# Patient Record
Sex: Female | Born: 1965 | Race: Black or African American | Hispanic: No | Marital: Single
Health system: Southern US, Community
[De-identification: ages and names within clinical notes are randomized; demographics above are authoritative.]

## PROBLEM LIST (undated history)

## (undated) DIAGNOSIS — M1990 Unspecified osteoarthritis, unspecified site: Secondary | ICD-10-CM

## (undated) DIAGNOSIS — J189 Pneumonia, unspecified organism: Secondary | ICD-10-CM

## (undated) DIAGNOSIS — K219 Gastro-esophageal reflux disease without esophagitis: Secondary | ICD-10-CM

## (undated) DIAGNOSIS — K5790 Diverticulosis of intestine, part unspecified, without perforation or abscess without bleeding: Secondary | ICD-10-CM

## (undated) DIAGNOSIS — E119 Type 2 diabetes mellitus without complications: Secondary | ICD-10-CM

## (undated) DIAGNOSIS — I10 Essential (primary) hypertension: Secondary | ICD-10-CM

## (undated) DIAGNOSIS — K279 Peptic ulcer, site unspecified, unspecified as acute or chronic, without hemorrhage or perforation: Secondary | ICD-10-CM

## (undated) DIAGNOSIS — K589 Irritable bowel syndrome without diarrhea: Secondary | ICD-10-CM

## (undated) DIAGNOSIS — M62838 Other muscle spasm: Secondary | ICD-10-CM

## (undated) DIAGNOSIS — S62102A Fracture of unspecified carpal bone, left wrist, initial encounter for closed fracture: Secondary | ICD-10-CM

## (undated) DIAGNOSIS — G8929 Other chronic pain: Secondary | ICD-10-CM

## (undated) DIAGNOSIS — F32A Depression, unspecified: Secondary | ICD-10-CM

## (undated) DIAGNOSIS — M549 Dorsalgia, unspecified: Secondary | ICD-10-CM

## (undated) DIAGNOSIS — F319 Bipolar disorder, unspecified: Secondary | ICD-10-CM

## (undated) DIAGNOSIS — T783XXA Angioneurotic edema, initial encounter: Secondary | ICD-10-CM

## (undated) DIAGNOSIS — R011 Cardiac murmur, unspecified: Secondary | ICD-10-CM

## (undated) DIAGNOSIS — I209 Angina pectoris, unspecified: Secondary | ICD-10-CM

## (undated) DIAGNOSIS — G5603 Carpal tunnel syndrome, bilateral upper limbs: Secondary | ICD-10-CM

## (undated) DIAGNOSIS — G629 Polyneuropathy, unspecified: Secondary | ICD-10-CM

## (undated) DIAGNOSIS — J45909 Unspecified asthma, uncomplicated: Secondary | ICD-10-CM

## (undated) DIAGNOSIS — E785 Hyperlipidemia, unspecified: Secondary | ICD-10-CM

## (undated) DIAGNOSIS — J449 Chronic obstructive pulmonary disease, unspecified: Secondary | ICD-10-CM

## (undated) DIAGNOSIS — F419 Anxiety disorder, unspecified: Secondary | ICD-10-CM

## (undated) DIAGNOSIS — K648 Other hemorrhoids: Secondary | ICD-10-CM

## (undated) DIAGNOSIS — F329 Major depressive disorder, single episode, unspecified: Secondary | ICD-10-CM

## (undated) HISTORY — DX: Carpal tunnel syndrome, bilateral upper limbs: G56.03

## (undated) HISTORY — DX: Diverticulosis of intestine, part unspecified, without perforation or abscess without bleeding: K57.90

## (undated) HISTORY — PX: WRIST FRACTURE SURGERY: SHX121

## (undated) HISTORY — PX: CHOLECYSTECTOMY: SHX55

## (undated) HISTORY — DX: Cardiac murmur, unspecified: R01.1

## (undated) HISTORY — DX: Other muscle spasm: M62.838

## (undated) HISTORY — DX: Angioneurotic edema, initial encounter: T78.3XXA

## (undated) HISTORY — DX: Irritable bowel syndrome, unspecified: K58.9

## (undated) HISTORY — PX: FOOT SURGERY: SHX648

## (undated) HISTORY — DX: Unspecified osteoarthritis, unspecified site: M19.90

## (undated) HISTORY — DX: Fracture of unspecified carpal bone, left wrist, initial encounter for closed fracture: S62.102A

## (undated) HISTORY — DX: Other hemorrhoids: K64.8

## (undated) HISTORY — PX: TONSILLECTOMY: SUR1361

## (undated) HISTORY — DX: Anxiety disorder, unspecified: F41.9

---

## 2001-08-24 HISTORY — PX: CARPAL TUNNEL RELEASE: SHX101

## 2006-08-24 HISTORY — PX: OTHER SURGICAL HISTORY: SHX169

## 2009-08-24 HISTORY — PX: ROTATOR CUFF REPAIR: SHX139

## 2011-06-19 ENCOUNTER — Emergency Department (HOSPITAL_COMMUNITY)
Admission: EM | Admit: 2011-06-19 | Discharge: 2011-06-19 | Disposition: A | Discharge status: Home / Self Care | Payer: Self-pay | Attending: Emergency Medicine | Admitting: Emergency Medicine

## 2011-06-19 DIAGNOSIS — K047 Periapical abscess without sinus: Secondary | ICD-10-CM | POA: Insufficient documentation

## 2011-06-19 DIAGNOSIS — F341 Dysthymic disorder: Secondary | ICD-10-CM | POA: Insufficient documentation

## 2011-06-19 DIAGNOSIS — R220 Localized swelling, mass and lump, head: Secondary | ICD-10-CM | POA: Insufficient documentation

## 2011-06-19 DIAGNOSIS — R221 Localized swelling, mass and lump, neck: Secondary | ICD-10-CM | POA: Insufficient documentation

## 2011-06-19 DIAGNOSIS — K089 Disorder of teeth and supporting structures, unspecified: Secondary | ICD-10-CM | POA: Insufficient documentation

## 2011-06-19 DIAGNOSIS — Z79899 Other long term (current) drug therapy: Secondary | ICD-10-CM | POA: Insufficient documentation

## 2011-06-19 DIAGNOSIS — K0381 Cracked tooth: Secondary | ICD-10-CM | POA: Insufficient documentation

## 2011-08-02 ENCOUNTER — Ambulatory Visit: Payer: Worker's Compensation

## 2011-08-05 ENCOUNTER — Ambulatory Visit: Payer: Worker's Compensation

## 2011-08-12 ENCOUNTER — Ambulatory Visit: Payer: Worker's Compensation

## 2011-09-30 ENCOUNTER — Encounter (HOSPITAL_COMMUNITY): Payer: Self-pay | Admitting: Emergency Medicine

## 2011-09-30 ENCOUNTER — Emergency Department (INDEPENDENT_AMBULATORY_CARE_PROVIDER_SITE_OTHER)
Admission: EM | Admit: 2011-09-30 | Discharge: 2011-09-30 | Disposition: A | Discharge status: Home / Self Care | Payer: Self-pay | Source: Home / Self Care

## 2011-09-30 DIAGNOSIS — M545 Low back pain, unspecified: Secondary | ICD-10-CM

## 2011-09-30 MED ORDER — CYCLOBENZAPRINE HCL 5 MG PO TABS: 5.0000 mg | ORAL_TABLET | Freq: Three times a day (TID) | ORAL | Status: AC | PRN

## 2011-09-30 MED ORDER — IBUPROFEN 800 MG PO TABS: 800.0000 mg | ORAL_TABLET | Freq: Three times a day (TID) | ORAL | Status: AC

## 2011-09-30 NOTE — ED Provider Notes (Signed)
History     CSN: [id number]  Arrival date & time 09/30/11  0957   None     Chief Complaint  Patient presents with  . Back Pain    (Consider location/radiation/quality/duration/timing/severity/associated sxs/prior treatment) HPI Comments: Amanda [name] presents with c/o low back spasms x 2 days. She denies injury or aggrevating activity. The pain does not radiate and she denies parasthesias of her lower extremities. She states she had similar back spasms 3 mos ago after she slipped and fell at work. She was treated with Percocet and Flexeril and this worked well for her. No abdominal pain, N/V, dysuria or fever.    History reviewed. No pertinent past medical history.  Past Surgical History  Procedure Date  . Rotator cuff repair   . Hammer toe fusion     History reviewed. No pertinent family history.  History  Substance Use Topics  . Smoking status: Never Smoker   . Smokeless tobacco: Not on file  . Alcohol Use: No    OB History    Grav Para Term Preterm Abortions TAB SAB Ect Mult Living                  Review of Systems  Constitutional: Negative for fever and chills.  Respiratory: Negative for cough and shortness of breath.   Cardiovascular: Negative for chest pain.  Gastrointestinal: Negative for nausea, vomiting, abdominal pain, diarrhea and constipation.  Genitourinary: Negative for dysuria and frequency.  Musculoskeletal: Positive for back pain.    Allergies  Penicillins  Home Medications   Current Outpatient Rx  Name Route Sig Dispense Refill  . ARIPIPRAZOLE 10 MG PO TABS Oral Take 10 mg by mouth daily.    Marland Kitchen LAMOTRIGINE 100 MG PO TABS Oral Take 100 mg by mouth daily.    Marland Kitchen OLANZAPINE 10 MG PO TABS Oral Take 10 mg by mouth at bedtime.    . TRAZODONE HCL 100 MG PO TABS Oral Take 100 mg by mouth at bedtime.    . CYCLOBENZAPRINE HCL 5 MG PO TABS Oral Take 1 tablet (5 mg total) by mouth 3 (three) times daily as needed for muscle spasms. 15 tablet 0  . IBUPROFEN 800  MG PO TABS Oral Take 1 tablet (800 mg total) by mouth 3 (three) times daily. 15 tablet 0    BP 132/80  Pulse 106  Temp(Src) 98.4 F (36.9 C) (Oral)  Resp 18  SpO2 98%  LMP 08/30/2011  Physical Exam  Nursing note and vitals reviewed. Constitutional: She appears well-developed and well-nourished. No distress.  HENT:  Head: Normocephalic and atraumatic.  Cardiovascular: Normal rate, regular rhythm and normal heart sounds.   Pulmonary/Chest: Effort normal and breath sounds normal. No respiratory distress.  Musculoskeletal:       Lumbar back: She exhibits tenderness and spasm. She exhibits normal range of motion, no bony tenderness, no swelling, no edema and no deformity.       Back:  Neurological: She is alert. She has normal strength. Gait normal.  Reflex Scores:      Patellar reflexes are 2+ on the right side and 2+ on the left side. Skin: Skin is warm and dry.  Psychiatric: She has a normal mood and affect.    ED Course  Procedures (including critical care time)  Labs Reviewed - No data to display No results found.   1. Acute lumbar back pain       MDM  Lumbar muscle spasm. No injury.  Melody Comas, [place] 09/30/11 1043

## 2011-09-30 NOTE — ED Notes (Signed)
PT HERE WITH MIDDLE OF BACK PAIN,NONRADIATING THAT FLARED UP YESTERDAY.PT S/P BACK INJURY WITH WORKERS COMP X 3 MNTHS AGO BUT STATES PAIN FLARED UP WHILE AT WORK.SX STRAIN AND SPASM THAT WORSENS IN THE AM.PT TRIED HEATING PAD BUT NO RELIEF.NO NUMB/TINGLING

## 2011-12-02 ENCOUNTER — Emergency Department (INDEPENDENT_AMBULATORY_CARE_PROVIDER_SITE_OTHER)
Admission: EM | Admit: 2011-12-02 | Discharge: 2011-12-02 | Disposition: A | Discharge status: Home / Self Care | Payer: Self-pay | Source: Home / Self Care | Attending: Emergency Medicine | Admitting: Emergency Medicine

## 2011-12-02 ENCOUNTER — Encounter (HOSPITAL_COMMUNITY): Payer: Self-pay | Admitting: Cardiology

## 2011-12-02 DIAGNOSIS — M7520 Bicipital tendinitis, unspecified shoulder: Secondary | ICD-10-CM

## 2011-12-02 DIAGNOSIS — S43429A Sprain of unspecified rotator cuff capsule, initial encounter: Secondary | ICD-10-CM

## 2011-12-02 DIAGNOSIS — M7522 Bicipital tendinitis, left shoulder: Secondary | ICD-10-CM

## 2011-12-02 HISTORY — DX: Depression, unspecified: F32.A

## 2011-12-02 HISTORY — DX: Major depressive disorder, single episode, unspecified: F32.9

## 2011-12-02 MED ORDER — MELOXICAM 7.5 MG PO TABS: 7.5000 mg | ORAL_TABLET | Freq: Every day | ORAL | Status: DC

## 2011-12-02 MED ORDER — TRAMADOL HCL 50 MG PO TABS: 50.0000 mg | ORAL_TABLET | Freq: Four times a day (QID) | ORAL | Status: AC | PRN

## 2011-12-02 NOTE — Discharge Instructions (Signed)
We have discussed her physical exam findings and also recommended you see orthopedics Dr. in [facility]. Have encouraged you to take this medicines to control your current discomfort.    Biceps Tendon Tendinitis (Proximal) and Tenosynovitis with Rehab Tendonitis and tenosynovitis involve inflammation of the tendon and the tendon lining (sheath). The proximal biceps tendon is vulnerable to tendonitis and tenosynovitis, which causes pain and discomfort in the front of the shoulder and upper arm. The tendon lining secretes a fluid that helps lubricate the tendon, allowing for proper function without pain. When the tendon and its lining become inflamed, the tendon can no longer glide smoothly, causing pain. The proximal biceps tendon connects the biceps muscle to two bones of the shoulder. It is important for proper function of the elbow and turning the palm upward (supination) using the wrist. Proximal biceps tendon tendinitis may include a grade 1 or 2 strain of the tendon. Grade 1 strains involve a slight pull of the tendon without signs of tearing and no observed tendon lengthening. There is also no loss of strength. Grade 2 strains involve small tears in the tendon fibers. The tendon or muscle is stretched and strength is usually decreased.  SYMPTOMS   Pain, tenderness, swelling, warmth, or redness over the front of the shoulder.   Pain that gets worse with shoulder and elbow use, especially against resistance.   Limited motion of the shoulder or elbow.   Crackling sound (crepitation) when the tendon or shoulder is moved or touched.  CAUSES  The symptoms of biceps tendonitis are due to inflammation of the tendon. Inflammation may be caused by:  Strain from sudden increase in amount or intensity of activity.   Direct blow or injury to the elbow (uncommon).   Overuse or repetitive elbow bending or wrist rotation, particularly when turning the palm up, or with elbow hyperextension.  RISK  INCREASES WITH:  Sports that involve contact or overhead arm activity (throwing sports, gymnastics, weightlifting, bodybuilding, rock climbing).   Heavy labor.   Poor strength and flexibility.   Failure to warm up properly before activity.  PREVENTION  Warm up and stretch properly before activity.   Allow time for recovery between activities.   Maintain physical fitness:   Strength, flexibility, and endurance.   Cardiovascular fitness.   Learn and use proper exercise technique.  PROGNOSIS  With proper treatment, proximal biceps tendon tendonitis and tenosynovitis is usually curable within 6 weeks. Healing is usually quicker if the cause was a direct blow, not overuse.  RELATED COMPLICATIONS   Longer healing time if not properly treated or if not given enough time to heal.   Chronically inflamed tendon that causes persistent pain with activity, that may progress to constant pain and potentially rupture of the tendon.   Recurring symptoms, especially if activity is resumed too soon or with overuse, a direct blow, or use of poor exercise technique.  TREATMENT Treatment first involves ice and medicine, to reduce pain and inflammation. It is helpful to modify activities that cause pain, to reduce the chances of causing the condition to get worse. Strengthening and stretching exercises should be performed to promote proper use of the muscles of the shoulder. These exercises may be performed at home or with a therapist. Other treatments may be given such as ultrasound or heat therapy. A corticosteroid injection may be recommended to help reduce inflammation of the tendon lining. Surgery is usually not necessary. Sometimes, if symptoms last for greater than 6 months, surgery will be  advised to detach the tendon and re-insert it into the arm bone. Surgery to correct other shoulder problems that may be contributing to tendinitis may be advised before surgery for the tendinitis itself.    MEDICATION  If pain medicine is needed, nonsteroidal anti-inflammatory medicines (aspirin and ibuprofen), or other minor pain relievers (acetaminophen), are often advised.   Do not take pain medicine for 7 days before surgery.   Prescription pain relievers may be given if your caregiver thinks they are needed. Use only as directed and only as much as you need.   Corticosteroid injections may be given. These injections should only be used on the most severe cases, as one can only receive a limited number of them.  HEAT AND COLD   Cold treatment (icing) should be applied for 10 to 15 minutes every 2 to 3 hours for inflammation and pain, and immediately after activity that aggravates your symptoms. Use ice packs or an ice massage.   Heat treatment may be used before performing stretching and strengthening activities prescribed by your caregiver, physical therapist, or athletic trainer. Use a heat pack or a warm water soak.  SEEK MEDICAL CARE IF:   Symptoms get worse or do not improve in 2 weeks, despite treatment.   New, unexplained symptoms develop. (Drugs used in treatment may produce side effects.)  EXERCISES RANGE OF MOTION (ROM) AND EXERCISES - Biceps Tendon (Proximal) and Tenosynovitis These exercises may help you when beginning to rehabilitate your injury. Your symptoms may go away with or without further involvement from your physician, physical therapist, or athletic trainer. While completing these exercises, remember:   Restoring tissue flexibility helps normal motion to return to the joints. This allows healthier, less painful movement and activity.   An effective stretch should be held for at least 30 seconds.   A stretch should never be painful. You should only feel a gentle lengthening or release in the stretched tissue.  STRETCH - Flexion, Standing  Stand with good posture. With an underhand grip on your right / left hand and an overhand grip on the opposite hand, grasp a  broomstick or cane so that your hands are a little more than shoulder width apart.   Keeping your right / left elbow straight and shoulder muscles relaxed, push the stick with your opposite hand to raise your right / left arm in front of your body and then overhead. Raise your arm until you feel a stretch in your right / left shoulder, but before you have increased shoulder pain.   Try to avoid shrugging your right / left shoulder as your arm rises, by keeping your shoulder blade tucked down and toward your mid-back spine. Hold for __________ seconds.   Slowly return to the starting position.  Repeat __________ times. Complete this exercise __________ times per day. STRETCH - Abduction, Supine  Lie on your back. With an underhand grip on your right / left hand and an overhand grip on the opposite hand, grasp a broomstick or cane so that your hands are a little more than shoulder width apart.   Keeping your right / left elbow straight and shoulder muscles relaxed, push the stick with your opposite hand to raise your right / left arm out to the side of your body and then overhead. Raise your arm until you feel a stretch in your right / left shoulder, but before you have increased shoulder pain.   Try to avoid shrugging your right / left shoulder as your arm  rises, by keeping your shoulder blade tucked down and toward your mid-back spine. Hold for __________ seconds.   Slowly return to the starting position.  Repeat __________ times. Complete this exercise __________ times per day. ROM - Flexion, Active-Assisted  Lie on your back. You may bend your knees for comfort.   Grasp a broomstick or cane so your hands are about shoulder width apart. Your right / left hand should grip the end of the stick so that your hand is positioned "thumbs-up," as if you were about to shake hands.   Using your healthy arm to lead, raise your right / left arm overhead until you feel a gentle stretch in your shoulder.  Hold for __________ seconds.   Use the stick to assist in returning your right / left arm to its starting position.  Repeat __________ times. Complete this exercise __________ times per day.  STRETCH - Flexion, Standing   Stand facing a wall. Walk your right / left fingers up the wall until you feel a moderate stretch in your shoulder. As your hand gets higher, you may need to step closer to the wall or use a door frame to walk through.   Try to avoid shrugging your right / left shoulder as your arm rises, by keeping your shoulder blade tucked down and toward your mid-back spine.   Hold for __________ seconds. Use your other hand, if needed, to ease out of the stretch and return to the starting position.  Repeat __________ times. Complete this exercise __________ times per day.  ROM - Internal Rotation   Using underhand grips, grasp a stick behind your back with both hands.   While standing upright with good posture, slide the stick up your back until you feel a mild stretch in the front of your shoulder.   Hold for __________ seconds. Slowly return to your starting position.  Repeat __________ times. Complete this exercise __________ times per day.  STRETCH - Internal Rotation  Place your right / left hand behind your back, palm-up.   Throw a towel or belt over your opposite shoulder. Grasp the towel with your right / left hand.   While keeping an upright posture, gently pull up on the towel until you feel a stretch in the front of your right / left shoulder.   Avoid shrugging your right / left shoulder as your arm rises, by keeping your shoulder blade tucked down and toward your mid-back spine.   Hold for __________ seconds. Release the stretch by lowering your opposite hand.  Repeat __________ times. Complete this exercise __________ times per day. STRENGTHENING EXERCISES - Biceps Tendon Tendinitis (Proximal) and Tenosynovitis These exercises may help you regain your strength  after your physician has discontinued your restraint in a cast or brace. They may resolve your symptoms with or without further involvement from your physician, physical therapist or athletic trainer. While completing these exercises, remember:   Muscles can gain both the endurance and the strength needed for everyday activities through controlled exercises.   Complete these exercises as instructed by your physician, physical therapist or athletic trainer. Increase the resistance and repetitions only as guided.   You may experience muscle soreness or fatigue, but the pain or discomfort you are trying to eliminate should never worsen during these exercises. If this pain does get worse, stop and make sure you are following the directions exactly. If the pain is still present after adjustments, discontinue the exercise until you can discuss the trouble with your  caregiver.  STRENGTH - Elbow Flexors, Isometric  Stand or sit upright on a firm surface. Place your right / left arm so that your hand is palm-up and at the height of your waist.   Place your opposite hand on top of your forearm. Gently push down as your right / left arm resists. Push as hard as you can with both arms, without causing any pain or movement at your right / left elbow. Hold this stationary position for __________ seconds.   Gradually release the tension in both arms. Allow your muscles to relax completely before repeating.  Repeat __________ times. Complete this exercise __________ times per day. STRENGTH - Shoulder Flexion, Isometric  With good posture and facing a wall, stand or sit about 4-6 inches away.   Keeping your right / left elbow straight, gently press the top of your fist into the wall. Increase the pressure gradually until you are pressing as hard as you can, without shrugging your shoulder or increasing any shoulder discomfort.   Hold for __________ seconds.   Release the tension slowly. Relax your shoulder  muscles completely before you start the next repetition.  Repeat __________ times. Complete this exercise __________ times per day.  STRENGTH - Elbow Flexors, Supinated  With good posture, stand or sit on a firm chair without armrests. Allow your right / left arm to rest at your side with your palm facing forward.   Holding a __________ weight, or gripping a rubber exercise band or tubing,   bring your hand toward your shoulder.   Allow your muscles to control the resistance as your hand returns to your side.  Repeat __________ times. Complete this exercise __________ times per day.  STRENGTH - Shoulder Flexion  Stand or sit with good posture. Grasp a __________ weight, or an exercise band or tubing, so that your hand is "thumbs-up," like when you shake hands.   Slowly lift your right / left arm as far as you can, without increasing any shoulder pain. At first, many people can only raise their hand to shoulder height.   Avoid shrugging your right / left shoulder as your arm rises, by keeping your shoulder blade tucked down and toward your mid-back spine.   Hold for __________ seconds. Control the descent of your hand as you slowly return to your starting position.  Repeat __________ times. Complete this exercise __________ times per day. Document Released: 08/10/2005 Document Revised: 07/30/2011 Document Reviewed: 11/22/2008 [facility] Patient Information 2012 [place], [place].

## 2011-12-02 NOTE — ED Notes (Addendum)
Pt c/o left shoulder pain that started one week ago. It is getting worse denies injury. Limited ROM. Pt has been working but had to call out today due to pain. Pt has been taking flexeril and ibuprofen she had for back pain. She did not get relief with those meds.

## 2011-12-02 NOTE — ED Provider Notes (Signed)
History     CSN: [id number]  Arrival date & time 12/02/11  1191   First MD Initiated Contact with Patient 12/02/11 1150      Chief Complaint  Patient presents with  . Shoulder Pain    (Consider location/radiation/quality/duration/timing/severity/associated sxs/prior treatment) Patient is a 46 y.o. female presenting with shoulder pain. The history is provided by the patient. No language interpreter was used.  Shoulder Pain This is a new (progessively worse over the week) problem. Episode onset: 1 week. Episode frequency: intermittent. The problem has been gradually worsening. Pertinent negatives include no chest pain and no shortness of breath. Exacerbated by: "lifting and letting the left arm down" The symptoms are relieved by nothing (has  used " Flexeril, Motrin without relief). Treatments tried: has used Flexeril and Ibuprofen without relief. The treatment provided no relief.    Past Medical History  Diagnosis Date  . Depression     Past Surgical History  Procedure Date  . Rotator cuff repair   . Hammer toe fusion     No family history on file.  History  Substance Use Topics  . Smoking status: Never Smoker   . Smokeless tobacco: Not on file  . Alcohol Use: No    OB History    Grav Para Term Preterm Abortions TAB SAB Ect Mult Living                  Review of Systems  Respiratory: Negative for shortness of breath.   Cardiovascular: Negative for chest pain.  Musculoskeletal: Negative for joint swelling.       " Left shoulder pain" "excruciating" with "movement and letting down" , some" weakness", reports previous hx of rotator cuff repair to right shoulder approximately 2 years ago  Skin: Negative for color change and rash.    Allergies  Penicillins  Home Medications   Current Outpatient Rx  Name Route Sig Dispense Refill  . ARIPIPRAZOLE 10 MG PO TABS Oral Take 10 mg by mouth daily.    Marland Kitchen LAMOTRIGINE 100 MG PO TABS Oral Take 100 mg by mouth daily.    .  TRAZODONE HCL 100 MG PO TABS Oral Take 100 mg by mouth at bedtime.    . MELOXICAM 7.5 MG PO TABS Oral Take 1 tablet (7.5 mg total) by mouth daily. 14 tablet 0  . OLANZAPINE 10 MG PO TABS Oral Take 10 mg by mouth at bedtime.    . TRAMADOL HCL 50 MG PO TABS Oral Take 1 tablet (50 mg total) by mouth every 6 (six) hours as needed for pain. 15 tablet 0    BP 140/95  Pulse 93  Temp(Src) 97.8 F (36.6 C) (Oral)  Resp 18  SpO2 98%  LMP 11/22/2011  Physical Exam  Constitutional: She is oriented to person, place, and time. She appears well-developed and well-nourished. No distress.  Pulmonary/Chest: Effort normal.  Musculoskeletal: She exhibits tenderness. She exhibits no edema.       Limited ROM to left upper extremity, able to abduction and adduction with pain, tenderness to lateral , anterior aspect of left shoulder, bicep area, strength 4/5, has good grip to left hand  Neurological: She is alert and oriented to person, place, and time.  Skin: Skin is warm and dry.       Skin intact    ED Course  Procedures (including critical care time)  Labs Reviewed - No data to display No results found.   1. Bicipital tendinitis of left shoulder   2.  Rotator cuff (capsule) sprain       MDM  Presents today to [facility] afebrile with c/o lateral and anterior left shoulder pain worsening over the past week, has used Flexeril and Ibuprofen without relief, able to abduct, adduct with some pain, P.S 4/10, patient will follow up with orthopedic , Rx for Meloxicam and Ultram given , patient agrees with tx plan, instruxcted to return or see  Orthopedics if s/s worsen        Lolly Mustache, Student-NP 12/02/11 1322

## 2011-12-02 NOTE — ED Provider Notes (Signed)
Medical screening examination/treatment/procedure(s) were performed by non-physician practitioner and as supervising physician I was immediately available for consultation/collaboration.I have examined ad interviewed the patient ad participated directly in her treatment plan  Amanda [name]   [name], MD 12/02/11 2002

## 2012-10-26 ENCOUNTER — Other Ambulatory Visit: Payer: Self-pay | Admitting: Internal Medicine

## 2012-10-26 DIAGNOSIS — Z1231 Encounter for screening mammogram for malignant neoplasm of breast: Secondary | ICD-10-CM

## 2012-11-14 ENCOUNTER — Ambulatory Visit: Payer: Self-pay | Admitting: Diagnostic Neuroimaging

## 2012-11-18 ENCOUNTER — Encounter: Payer: Self-pay | Admitting: Diagnostic Neuroimaging

## 2012-11-18 ENCOUNTER — Ambulatory Visit (INDEPENDENT_AMBULATORY_CARE_PROVIDER_SITE_OTHER): Payer: BC Managed Care – PPO | Admitting: Diagnostic Neuroimaging

## 2012-11-18 VITALS — BP 147/89 | HR 92 | Temp 98.0°F | Ht 68.0 in | Wt 207.5 lb

## 2012-11-18 DIAGNOSIS — M5416 Radiculopathy, lumbar region: Secondary | ICD-10-CM | POA: Insufficient documentation

## 2012-11-18 DIAGNOSIS — R202 Paresthesia of skin: Secondary | ICD-10-CM | POA: Insufficient documentation

## 2012-11-18 DIAGNOSIS — R209 Unspecified disturbances of skin sensation: Secondary | ICD-10-CM

## 2012-11-18 DIAGNOSIS — R6889 Other general symptoms and signs: Secondary | ICD-10-CM

## 2012-11-18 DIAGNOSIS — M545 Low back pain, unspecified: Secondary | ICD-10-CM

## 2012-11-18 DIAGNOSIS — R200 Anesthesia of skin: Secondary | ICD-10-CM | POA: Insufficient documentation

## 2012-11-18 DIAGNOSIS — G5601 Carpal tunnel syndrome, right upper limb: Secondary | ICD-10-CM

## 2012-11-18 DIAGNOSIS — G5602 Carpal tunnel syndrome, left upper limb: Secondary | ICD-10-CM

## 2012-11-18 DIAGNOSIS — G5600 Carpal tunnel syndrome, unspecified upper limb: Secondary | ICD-10-CM | POA: Insufficient documentation

## 2012-11-18 DIAGNOSIS — F411 Generalized anxiety disorder: Secondary | ICD-10-CM | POA: Insufficient documentation

## 2012-11-18 NOTE — Patient Instructions (Signed)
I will order EMG and lab tests. Ask PCP to increase amitriptyline up to 25mg  at bedtime, as tolerated. See website [email or web address] for medication discounts.

## 2012-11-18 NOTE — Progress Notes (Signed)
[facility]  PATIENT: Amanda [name] DOB: December 12, 1965  REFERRING CLINICIAN: Roseanne Reno HISTORY FROM: patient REASON FOR VISIT: new consult   HISTORICAL  CHIEF COMPLAINT:  Chief Complaint  Patient presents with  . Numbness    hands and feet  . Pain    hands and feet    HISTORY OF PRESENT ILLNESS:   47 year old right-handed female with history of depression, anxiety, bilateral carpal tunnel syndrome, here for evaluation of numbness and pain in her hands and feet.  In 2003 patient was diagnosed with bilateral carpal tunnel syndrome, underwent wrist splint therapy, steroid injections, and ultimately right side carpal tunnel release surgery. Patient had some improvement of symptoms initially. Over time her symptoms have returned. Patient having increasing numbness, tingling in her hands. She is dropping objects. She wakes up in the middle the night and shakes her hands. Even during our conversation patient repeatedly rubs her hands and shakes her hands.  Over the past 6 months patient has developed numbness and tingling in her toes on both sides. She has remote history of bilateral hammertoe fusion surgery. No family history of hammertoes. Patient doesn't remember what time in her life she developed these hammertoes.  Patient is markedly anxious during our conversation. Sshe interrupts me frequently and apologizes at the same time.  She has pressured speech and tangential thought process.  REVIEW OF SYSTEMS: Full 14 system review of systems performed and notable only for feeling hot, feeling cold, aching muscles, joint pain, numbness, sleepiness, not enough sleep.  ALLERGIES: Allergies  Allergen Reactions  . Penicillins     HOME MEDICATIONS: Prior to Admission medications   Medication Sig Start Date End Date Taking? Authorizing Provider  amitriptyline (ELAVIL) 10 MG tablet Take 10 mg by mouth at bedtime.   Yes Historical Provider, MD  HYDROcodone-acetaminophen  (VICODIN) 5-500 MG per tablet Take 1 tablet by mouth every 6 (six) hours as needed for pain.   Yes Historical Provider, MD  zolpidem (AMBIEN) 5 MG tablet Take 5 mg by mouth at bedtime as needed for sleep.   Yes Historical Provider, MD    PAST MEDICAL HISTORY: Past Medical History  Diagnosis Date  . Depression   . Anxiety   . Carpal tunnel syndrome, bilateral     PAST SURGICAL HISTORY: Past Surgical History  Procedure Laterality Date  . Rotator cuff repair Right 2011  . Hammer toe fusion Bilateral 2008  . Carpal tunnel release Right 2003    FAMILY HISTORY: Family History  Problem Relation Age of Onset  . Cancer Mother     pancreatic  . Heart failure Father     SOCIAL HISTORY:  History   Social History  . Marital Status: Single    Spouse Name: N/A    Number of Children: N/A  . Years of Education: N/A   Occupational History  . [name]   Social History Main Topics  . Smoking status: Never Smoker   . Smokeless tobacco: Not on file  . Alcohol Use: No  . Drug Use: No  . Sexually Active: Not on file   Other Topics Concern  . Not on file   Social History Narrative   Pt lives at home alone. She has a HS education level and does not have any children.    She drinks 2 cups of caffeine daily.     PHYSICAL EXAM  Filed Vitals:   11/18/12 1008  BP: 147/89  Pulse: 92  Temp: 98 F (  36.7 C)  TempSrc: Oral  Height: 5\' 8"  (1.727 m)  Weight: 207 lb 8 oz (94.121 kg)   Body mass index is 31.56 kg/(m^2).  GENERAL EXAM: Patient is in no distress; ANXIOUS, PRESSURED SPEECH.  CARDIOVASCULAR: Regular rate and rhythm, no murmurs, no carotid bruits  NEUROLOGIC: MENTAL STATUS: awake, alert, language fluent, comprehension intact, naming intact CRANIAL NERVE: no papilledema on fundoscopic exam, pupils equal and reactive to light, visual fields full to confrontation, extraocular muscles intact, no nystagmus, facial sensation and strength symmetric, uvula midline,  shoulder shrug symmetric, tongue midline. MOTOR: normal bulk and tone, full strength in the BUE, BLE; BILATERAL HAMMER TOE SURGICAL SCARS. SENSORY: normal and symmetric to light touch, pinprick, temperature, vibration; DECREASED PP IN BOTTOM OF FEET. COORDINATION: finger-nose-finger, fine finger movements normal REFLEXES: deep tendon reflexes present and symmetric; TRACE AT ANKLES. DOWN GOING TOES. GAIT/STATION: narrow based gait; able to walk on toes, heels and tandem; romberg is negative   DIAGNOSTIC DATA (LABS, IMAGING, TESTING) - I reviewed patient records, labs, notes, testing and imaging myself where available.  No results found for this basename: WBC, HGB, HCT, MCV, PLT   No results found for this basename: na, k, cl, co2, glucose, bun, creatinine, calcium, prot, albumin, ast, alt, alkphos, bilitot, gfrnonaa, gfraa   No results found for this basename: CHOL, HDL, LDLCALC, LDLDIRECT, TRIG, CHOLHDL   No results found for this basename: HGBA1C   No results found for this basename: VITAMINB12   No results found for this basename: TSH   CBC, BMP - normal TSH - 1.068 CRP - 10 RF - < 10   ASSESSMENT AND PLAN  48 y.o. year old female  has a past medical history of Depression; Anxiety; and Carpal tunnel syndrome, bilateral. here with numbness and pain in hands and feet. A hand symptoms may be related to recurrent carpal tunnel syndrome problems. The symptoms in the feet may represent new onset of peripheral neuropathy versus lumbar radiculopathy. I will check some diagnostic testing. I agree with amitriptyline, and the PCP may consider increasing to 25 or 50 mg over time. Other medications to consider the future would include gabapentin, Lyrica or Cymbalta.   Orders Placed This Encounter  Procedures  . Vitamin B12  . Hemoglobin A1c  . NCV with EMG(electromyography)     Suanne Marker, MD 11/18/2012, 10:52 AM Certified in Neurology, Neurophysiology and  Neuroimaging  [facility] [address], [address] [place], [place] [address] [phone]

## 2012-11-19 LAB — HEMOGLOBIN A1C
Est. average glucose Bld gHb Est-mCnc: 100 mg/dL
Hgb A1c MFr Bld: 5.1 % (ref 4.8–5.6)

## 2012-11-19 LAB — VITAMIN B12: Vitamin B-12: 509 pg/mL (ref 211–946)

## 2012-11-22 ENCOUNTER — Telehealth: Payer: Self-pay | Admitting: Diagnostic Neuroimaging

## 2012-11-22 NOTE — Telephone Encounter (Signed)
Spoke to pt, informed labs normal

## 2012-11-22 NOTE — Telephone Encounter (Signed)
Message copied by Ellin Goodie on Tue Nov 22, 2012 10:59 AM ------      Message from: Joycelyn Schmid      Created: Mon Nov 21, 2012  1:41 PM       Normal labs. Call patient with results. -VRP ------

## 2012-11-28 ENCOUNTER — Ambulatory Visit: Payer: Self-pay

## 2012-12-15 ENCOUNTER — Ambulatory Visit: Payer: Self-pay

## 2012-12-27 ENCOUNTER — Ambulatory Visit: Payer: Self-pay

## 2013-01-03 ENCOUNTER — Ambulatory Visit: Payer: Self-pay

## 2013-01-09 ENCOUNTER — Ambulatory Visit
Admission: RE | Admit: 2013-01-09 | Discharge: 2013-01-09 | Disposition: A | Discharge status: Home / Self Care | Payer: BC Managed Care – PPO | Source: Ambulatory Visit | Attending: Internal Medicine | Admitting: Internal Medicine

## 2013-01-09 DIAGNOSIS — Z1231 Encounter for screening mammogram for malignant neoplasm of breast: Secondary | ICD-10-CM

## 2013-01-15 ENCOUNTER — Emergency Department (HOSPITAL_COMMUNITY)
Admission: EM | Admit: 2013-01-15 | Discharge: 2013-01-16 | Disposition: A | Discharge status: Home / Self Care | Payer: BC Managed Care – PPO | Attending: Emergency Medicine | Admitting: Emergency Medicine

## 2013-01-15 DIAGNOSIS — F329 Major depressive disorder, single episode, unspecified: Secondary | ICD-10-CM | POA: Insufficient documentation

## 2013-01-15 DIAGNOSIS — R51 Headache: Secondary | ICD-10-CM | POA: Insufficient documentation

## 2013-01-15 DIAGNOSIS — Z880 Allergy status to penicillin: Secondary | ICD-10-CM | POA: Insufficient documentation

## 2013-01-15 DIAGNOSIS — F411 Generalized anxiety disorder: Secondary | ICD-10-CM | POA: Insufficient documentation

## 2013-01-15 DIAGNOSIS — Z79899 Other long term (current) drug therapy: Secondary | ICD-10-CM | POA: Insufficient documentation

## 2013-01-15 DIAGNOSIS — Z8739 Personal history of other diseases of the musculoskeletal system and connective tissue: Secondary | ICD-10-CM | POA: Insufficient documentation

## 2013-01-15 DIAGNOSIS — F3289 Other specified depressive episodes: Secondary | ICD-10-CM | POA: Insufficient documentation

## 2013-01-15 LAB — BASIC METABOLIC PANEL
BUN: 8 mg/dL (ref 6–23)
CO2: 25 mEq/L (ref 19–32)
Calcium: 9.7 mg/dL (ref 8.4–10.5)
Chloride: 98 mEq/L (ref 96–112)
Creatinine, Ser: 0.7 mg/dL (ref 0.50–1.10)
GFR calc Af Amer: >90 mL/min (ref >90)
GFR calc non Af Amer: >90 mL/min (ref >90)
Glucose, Bld: 87 mg/dL (ref 70–99)
Potassium: 3.6 mEq/L (ref 3.5–5.1)
Sodium: 135 mEq/L (ref 135–145)

## 2013-01-15 LAB — CBC
HCT: 38.1 % (ref 36.0–46.0)
Hemoglobin: 13.2 g/dL (ref 12.0–15.0)
MCH: 33.5 pg (ref 26.0–34.0)
MCHC: 34.6 g/dL (ref 30.0–36.0)
MCV: 96.7 fL (ref 78.0–100.0)
Platelets: 288 10*3/uL (ref 150–400)
RBC: 3.94 MIL/uL (ref 3.87–5.11)
RDW: 12.3 % (ref 11.5–15.5)
WBC: 8.1 10*3/uL (ref 4.0–10.5)

## 2013-01-15 MED ORDER — KETOROLAC TROMETHAMINE 30 MG/ML IJ SOLN
30.0000 mg | Freq: Once | INTRAMUSCULAR | Status: AC
  Administered 2013-01-15: 30 mg via INTRAMUSCULAR
  Filled 2013-01-15: qty 1

## 2013-01-15 MED ORDER — DIPHENHYDRAMINE HCL 50 MG/ML IJ SOLN
25.0000 mg | Freq: Once | INTRAMUSCULAR | Status: AC
  Administered 2013-01-15: 25 mg via INTRAMUSCULAR
  Filled 2013-01-15: qty 1

## 2013-01-15 MED ORDER — DEXAMETHASONE SODIUM PHOSPHATE 10 MG/ML IJ SOLN
10.0000 mg | Freq: Once | INTRAMUSCULAR | Status: AC
  Administered 2013-01-15: 10 mg via INTRAMUSCULAR
  Filled 2013-01-15: qty 1

## 2013-01-15 MED ORDER — METOCLOPRAMIDE HCL 5 MG/ML IJ SOLN
10.0000 mg | Freq: Once | INTRAMUSCULAR | Status: AC
  Administered 2013-01-15: 10 mg via INTRAMUSCULAR
  Filled 2013-01-15: qty 2

## 2013-01-15 NOTE — ED Notes (Signed)
I gave the patient a warm blanket. 

## 2013-01-15 NOTE — ED Notes (Signed)
Nurse explained delay , waiting time and process to pt. Ambulatory , respirations unlabored .

## 2013-01-15 NOTE — ED Notes (Signed)
Phlebotomist notified to collect blood specimen at triage .

## 2013-01-15 NOTE — ED Notes (Addendum)
Pt states that she has a throbbing HA, with dizziness. Location temples, and back of head.

## 2013-01-16 ENCOUNTER — Encounter (HOSPITAL_COMMUNITY): Payer: Self-pay | Admitting: *Deleted

## 2013-01-16 NOTE — ED Notes (Signed)
Pt ambulating independently w/ steady gait on d/c in no acute distress, A&Ox4.D/c instructions reviewed w/ pt and family - pt and family deny any further questions or concerns at present.  

## 2013-01-16 NOTE — ED Notes (Signed)
Pt reports migraine HA that began this a.m. approx 0730 - pt states she has experienced some dizziness and nausea as well - pt admits to hx of migraines. Pt is A&Ox4 and in no acute distress.

## 2013-01-16 NOTE — ED Provider Notes (Signed)
Medical screening examination/treatment/procedure(s) were performed by non-physician practitioner and as supervising physician I was immediately available for consultation/collaboration.  Sunnie Nielsen, MD 01/16/13 2300

## 2013-01-16 NOTE — ED Provider Notes (Signed)
History     CSN: [id number]  Arrival date & time 01/15/13  4098   First MD Initiated Contact with Patient 01/15/13 2330      Chief Complaint  Patient presents with  . Headache   HPI  History provided by the patient and family. Patient is a 47 year old female with history of anxiety, depression and previous migraine headaches who presents with complaints of headache. Symptoms began early in the morning have been persistent all day long. Pain is located on bilateral temporal area and slightly to the back of the head and base of the neck. She reports having similar headaches in the past most recently in March. At home today she's been taking BC powders and Tylenol without any improvement. She also took one dose of Excedrin. Denies any other aggravating or alleviating factors. Denies any other associated symptoms. No fever, chills or sweats. No weakness or numbness in extremities. No vision change or loss. No difficulties with speech.    Past Medical History  Diagnosis Date  . Depression   . Anxiety   . Carpal tunnel syndrome, bilateral     Past Surgical History  Procedure Laterality Date  . Rotator cuff repair Right 2011  . Hammer toe fusion Bilateral 2008  . Carpal tunnel release Right 2003    Family History  Problem Relation Age of Onset  . Cancer Mother     pancreatic  . Heart failure Father     History  Substance Use Topics  . Smoking status: Never Smoker   . Smokeless tobacco: Not on file  . Alcohol Use: No    OB History   Grav Para Term Preterm Abortions TAB SAB Ect Mult Living                  Review of Systems  Constitutional: Negative for fever, chills and diaphoresis.  Eyes: Positive for photophobia. Negative for visual disturbance.  Gastrointestinal: Negative for nausea and vomiting.  Neurological: Positive for headaches. Negative for dizziness and light-headedness.  All other systems reviewed and are negative.    Allergies  Penicillins  Home  Medications   Current Outpatient Rx  Name  Route  Sig  Dispense  Refill  . amitriptyline (ELAVIL) 25 MG tablet   Oral   Take 25 mg by mouth at bedtime.         Marland Kitchen atenolol (TENORMIN) 50 MG tablet   Oral   Take 50 mg by mouth daily.         . cloNIDine (CATAPRES) 0.1 MG tablet   Oral   Take 0.1 mg by mouth 3 (three) times daily.         . hydrochlorothiazide (HYDRODIURIL) 25 MG tablet   Oral   Take 25 mg by mouth daily.         Marland Kitchen omeprazole (PRILOSEC) 20 MG capsule   Oral   Take 20 mg by mouth daily.         Marland Kitchen HYDROcodone-acetaminophen (VICODIN) 5-500 MG per tablet   Oral   Take 1 tablet by mouth every 6 (six) hours as needed for pain.           BP 141/87  Pulse 86  Temp(Src) 97.8 F (36.6 C) (Oral)  Resp 16  SpO2 100%  LMP 12/19/2012  Physical Exam  Nursing note and vitals reviewed. Constitutional: She is oriented to person, place, and time. She appears well-developed and well-nourished. No distress.  HENT:  Head: Normocephalic and atraumatic.  Eyes: Conjunctivae and  EOM are normal. Pupils are equal, round, and reactive to light.  Neck: Normal range of motion. Neck supple.  No meningeal signs  Cardiovascular: Normal rate and regular rhythm.   No murmur heard. Pulmonary/Chest: Effort normal and breath sounds normal. No respiratory distress. She has no wheezes. She has no rales.  Abdominal: Soft. There is no tenderness. There is no rigidity, no rebound, no guarding, no CVA tenderness and no tenderness at McBurney's point.  Neurological: She is alert and oriented to person, place, and time. She has normal strength. No cranial nerve deficit or sensory deficit. Gait normal.  Skin: Skin is warm and dry. No rash noted.  Psychiatric: She has a normal mood and affect. Her behavior is normal.    ED Course  Procedures   Results for orders placed during the hospital encounter of 01/15/13  BASIC METABOLIC PANEL      Result Value Range   Sodium 135  135 - 145  mEq/L   Potassium 3.6  3.5 - 5.1 mEq/L   Chloride 98  96 - 112 mEq/L   CO2 25  19 - 32 mEq/L   Glucose, Bld 87  70 - 99 mg/dL   BUN 8  6 - 23 mg/dL   Creatinine, Ser 1.61  0.50 - 1.10 mg/dL   Calcium 9.7  8.4 - 09.6 mg/dL   GFR calc non Af Amer >90  >90 mL/min   GFR calc Af Amer >90  >90 mL/min  CBC      Result Value Range   WBC 8.1  4.0 - 10.5 K/uL   RBC 3.94  3.87 - 5.11 MIL/uL   Hemoglobin 13.2  12.0 - 15.0 g/dL   HCT 04.5  40.9 - 81.1 %   MCV 96.7  78.0 - 100.0 fL   MCH 33.5  26.0 - 34.0 pg   MCHC 34.6  30.0 - 36.0 g/dL   RDW 91.4  78.2 - 95.6 %   Platelets 288  150 - 400 K/uL        1. Headache       MDM   Patient seen and evaluated. Patient appears well and in mild discomfort. She has normal nonfocal neuro exam.  Patient reports feeling significant better after medications. Now she is requesting something to eat. She also requesting to return home.   Angus Seller, PA-C 01/16/13 2127

## 2013-01-17 ENCOUNTER — Other Ambulatory Visit: Payer: Self-pay | Admitting: Internal Medicine

## 2013-01-17 DIAGNOSIS — R921 Mammographic calcification found on diagnostic imaging of breast: Secondary | ICD-10-CM

## 2013-01-27 ENCOUNTER — Ambulatory Visit
Admission: RE | Admit: 2013-01-27 | Discharge: 2013-01-27 | Disposition: A | Discharge status: Home / Self Care | Payer: BC Managed Care – PPO | Source: Ambulatory Visit | Attending: Internal Medicine | Admitting: Internal Medicine

## 2013-01-27 DIAGNOSIS — R921 Mammographic calcification found on diagnostic imaging of breast: Secondary | ICD-10-CM

## 2013-02-17 ENCOUNTER — Emergency Department (HOSPITAL_COMMUNITY)
Admission: EM | Admit: 2013-02-17 | Discharge: 2013-02-17 | Disposition: A | Discharge status: Home / Self Care | Payer: BC Managed Care – PPO | Attending: Emergency Medicine | Admitting: Emergency Medicine

## 2013-02-17 ENCOUNTER — Emergency Department (HOSPITAL_COMMUNITY): Payer: BC Managed Care – PPO

## 2013-02-17 ENCOUNTER — Encounter (HOSPITAL_COMMUNITY): Payer: Self-pay | Admitting: Emergency Medicine

## 2013-02-17 DIAGNOSIS — Z79899 Other long term (current) drug therapy: Secondary | ICD-10-CM | POA: Insufficient documentation

## 2013-02-17 DIAGNOSIS — M549 Dorsalgia, unspecified: Secondary | ICD-10-CM | POA: Insufficient documentation

## 2013-02-17 DIAGNOSIS — Z8669 Personal history of other diseases of the nervous system and sense organs: Secondary | ICD-10-CM | POA: Insufficient documentation

## 2013-02-17 DIAGNOSIS — Z880 Allergy status to penicillin: Secondary | ICD-10-CM | POA: Insufficient documentation

## 2013-02-17 DIAGNOSIS — F411 Generalized anxiety disorder: Secondary | ICD-10-CM | POA: Insufficient documentation

## 2013-02-17 DIAGNOSIS — F3289 Other specified depressive episodes: Secondary | ICD-10-CM | POA: Insufficient documentation

## 2013-02-17 DIAGNOSIS — R209 Unspecified disturbances of skin sensation: Secondary | ICD-10-CM | POA: Insufficient documentation

## 2013-02-17 DIAGNOSIS — R200 Anesthesia of skin: Secondary | ICD-10-CM

## 2013-02-17 DIAGNOSIS — M542 Cervicalgia: Secondary | ICD-10-CM | POA: Insufficient documentation

## 2013-02-17 DIAGNOSIS — F329 Major depressive disorder, single episode, unspecified: Secondary | ICD-10-CM | POA: Insufficient documentation

## 2013-02-17 MED ORDER — OXYCODONE-ACETAMINOPHEN 5-325 MG PO TABS: 1.0000 | ORAL_TABLET | Freq: Three times a day (TID) | ORAL | Status: DC | PRN

## 2013-02-17 MED ORDER — NAPROXEN 500 MG PO TABS: 500.0000 mg | ORAL_TABLET | Freq: Two times a day (BID) | ORAL | Status: DC

## 2013-02-17 MED ORDER — OXYCODONE-ACETAMINOPHEN 5-325 MG PO TABS
2.0000 | ORAL_TABLET | Freq: Once | ORAL | Status: AC
  Administered 2013-02-17: 2 via ORAL
  Filled 2013-02-17: qty 2

## 2013-02-17 NOTE — ED Provider Notes (Signed)
History    CSN: [id number] Arrival date & time 02/17/13  1059  First MD Initiated Contact with Patient 02/17/13 1158     Chief Complaint  Patient presents with  . Tailbone Pain  . Neck Pain   (Consider location/radiation/quality/duration/timing/severity/associated sxs/prior Treatment) Patient is a 47 y.o. female presenting with neck pain. The history is provided by the patient. No language interpreter was used.  Neck Pain Associated symptoms: numbness   Associated symptoms: no chest pain, no fever, no headaches, no photophobia and no weakness   Amanda [name] is a 47 y/o F with PMHx of depression, anxiety, carpal tunnel syndrome presenting to the ED with neck pain and back pain that has been ongoing for the past month - patient described discomfort as being a constant, stabbing sensation with numbness and tingling to the arms and legs - patient reported that the pain worsens with walking and standing, feels better when the patient lays flat on her back. Patient reported that she was being seen by Dr. Roseanne Reno for pain control and was given Hydrocodone - patient stated that she ran out of pain medications last week and has been taking Tylenol with no relief. Stated that it took her 2 hours to get out of bed due to being in pain. Denied injury, fall, MVA, chest pain, shortness of breath, weakness, difficulty breathing, urinary incontinence.  PCP none   Past Medical History  Diagnosis Date  . Depression   . Anxiety   . Carpal tunnel syndrome, bilateral    Past Surgical History  Procedure Laterality Date  . Rotator cuff repair Right 2011  . Hammer toe fusion Bilateral 2008  . Carpal tunnel release Right 2003   Family History  Problem Relation Age of Onset  . Cancer Mother     pancreatic  . Heart failure Father    History  Substance Use Topics  . Smoking status: Never Smoker   . Smokeless tobacco: Not on file  . Alcohol Use: No   OB History   Grav Para Term Preterm Abortions  TAB SAB Ect Mult Living                 Review of Systems  Constitutional: Negative for fever and chills.  HENT: Positive for neck pain. Negative for trouble swallowing.   Eyes: Negative for photophobia and visual disturbance.  Respiratory: Negative for chest tightness and shortness of breath.   Cardiovascular: Negative for chest pain.  Genitourinary: Negative for dysuria and difficulty urinating.  Musculoskeletal: Positive for back pain.  Neurological: Positive for numbness. Negative for dizziness, weakness and headaches.  All other systems reviewed and are negative.    Allergies  Penicillins  Home Medications   Current Outpatient Rx  Name  Route  Sig  Dispense  Refill  . amitriptyline (ELAVIL) 25 MG tablet   Oral   Take 25 mg by mouth at bedtime.         Marland Kitchen atenolol (TENORMIN) 50 MG tablet   Oral   Take 50 mg by mouth daily.         . cloNIDine (CATAPRES) 0.1 MG tablet   Oral   Take 0.1 mg by mouth 3 (three) times daily.         . hydrochlorothiazide (HYDRODIURIL) 25 MG tablet   Oral   Take 25 mg by mouth daily.         Marland Kitchen HYDROcodone-acetaminophen (VICODIN) 5-500 MG per tablet   Oral   Take 1 tablet by mouth  every 6 (six) hours as needed for pain.         Marland Kitchen omeprazole (PRILOSEC) 20 MG capsule   Oral   Take 20 mg by mouth daily.         . naproxen (NAPROSYN) 500 MG tablet   Oral   Take 1 tablet (500 mg total) by mouth 2 (two) times daily.   30 tablet   0   . oxyCODONE-acetaminophen (PERCOCET) 5-325 MG per tablet   Oral   Take 1 tablet by mouth every 8 (eight) hours as needed for pain.   5 tablet   0    BP 121/74  Pulse 81  Temp(Src) 97.8 F (36.6 C) (Oral)  Resp 18  Wt 207 lb (93.895 kg)  BMI 31.48 kg/m2  SpO2 100%  LMP 01/15/2013 Physical Exam  Nursing note and vitals reviewed. Constitutional: She is oriented to person, place, and time. She appears well-developed and well-nourished. No distress.  HENT:  Head: Normocephalic and  atraumatic.  Eyes: Conjunctivae and EOM are normal. Pupils are equal, round, and reactive to light. Right eye exhibits no discharge. Left eye exhibits no discharge.  Neck: Normal range of motion. Neck supple.    Pain upon palpation to the cervical spine Negative nuchal rigidity Negative neck stiffness   Cardiovascular: Normal rate, regular rhythm and normal heart sounds.  Exam reveals no friction rub.   No murmur heard. Pulses:      Radial pulses are 2+ on the right side, and 2+ on the left side.       Dorsalis pedis pulses are 2+ on the right side, and 2+ on the left side.  Pulmonary/Chest: Effort normal and breath sounds normal. No respiratory distress. She has no wheezes. She has no rales.  Musculoskeletal: She exhibits tenderness. She exhibits no edema.       Cervical back: She exhibits tenderness. She exhibits normal range of motion, no bony tenderness, no swelling, no edema, no deformity and no laceration.       Lumbar back: She exhibits decreased range of motion (Pain with walking ) and tenderness. She exhibits no bony tenderness, no swelling, no edema, no deformity, no laceration and no pain.       Back:  Full ROM to upper and lower extremities bilaterally Mild decreased flexion secondary to pain  Strength 5+/5+ to upper and lower extremities bilaterally with resistance.    Lymphadenopathy:    She has no cervical adenopathy.  Neurological: She is alert and oriented to person, place, and time. No cranial nerve deficit. She exhibits normal muscle tone. Coordination normal.  Cranial nerves III-XII grossly intact  Sensation intact to upper and lower extremities, bilaterally, with differentiation to sharp and dull touch   Skin: Skin is warm and dry. No rash noted. She is not diaphoretic. No erythema.  Psychiatric: She has a normal mood and affect. Her behavior is normal. Thought content normal.    ED Course  Procedures (including critical care time) Labs Reviewed - No data to  display Dg Cervical Spine Complete  02/17/2013   *RADIOLOGY REPORT*  Clinical Data: Left-sided neck pain with tingling in the left fingers.  CERVICAL SPINE - COMPLETE 4+ VIEW  Comparison: None.  Findings: There is no fracture, subluxation, disc space narrowing, foraminal stenosis, or facet arthritis.  No prevertebral soft tissue swelling.  IMPRESSION: Normal cervical spine.   Original Report Authenticated By: Francene Boyers, M.D.   Dg Lumbar Spine Complete  02/17/2013   *RADIOLOGY REPORT*  Clinical Data:  Low back pain and bilateral leg tingling.  LUMBAR SPINE - COMPLETE 4+ VIEW  Comparison: None.  Findings: There is no fracture, subluxation, disc space narrowing, facet arthritis, or other abnormality of the lumbar spine.  IMPRESSION: Normal lumbar spine.   Original Report Authenticated By: Francene Boyers, M.D.   1. Back pain   2. Neck pain   3. Numbness     MDM  Patient presenting with neck and lower back pain that has been ongoing for the past month - patient has been being followed by physician, was diagnosed by physician with neuropathy. Dr. Roseanne Reno was controlling patient's pain, but moved practice. Patient no longer has pain medications. Imaging performed - negative acute injury, negative disc space narrowing. Discussed case with Dr. Cheri Rous - cleared patient for discharge. No neurovascular damage noted - discomfort noted to be ongoing for the past month, same without changes. Patient has been being followed for discomfort with pain management. Discharged patient with anti-inflammatories and pain medications - discussed course, precautions, and disposal technique. Referred patient to orthopedics and for MRI to be performed as outpatient. Discussed with patient to rest and stay hydrated. Discussed with patient to monitor symptoms and if symptoms are to worsen or change to report back to the ED - strict return instructions given.  Patient agreed to plan of care, understood, all questions answered.    Raymon Mutton, PA-C 02/17/13 1809

## 2013-02-17 NOTE — ED Notes (Signed)
Patient states that it took her 2 hours to get out of bed today and her pain is causing her to have pain and tingling all over her body. The patient reports that she has had the same pain x 1 month

## 2013-02-21 NOTE — ED Provider Notes (Signed)
Medical screening examination/treatment/procedure(s) were performed by non-physician practitioner and as supervising physician I was immediately available for consultation/collaboration.   Hikeem Andersson H Elane Peabody, MD 02/21/13 0700 

## 2013-02-23 ENCOUNTER — Encounter: Payer: Self-pay | Admitting: Family Medicine

## 2013-02-23 ENCOUNTER — Ambulatory Visit (INDEPENDENT_AMBULATORY_CARE_PROVIDER_SITE_OTHER): Payer: BC Managed Care – PPO | Admitting: Family Medicine

## 2013-02-23 VITALS — BP 100/60 | HR 83 | Temp 99.0°F | Resp 16 | Ht 68.0 in | Wt 214.8 lb

## 2013-02-23 DIAGNOSIS — M62830 Muscle spasm of back: Secondary | ICD-10-CM

## 2013-02-23 DIAGNOSIS — R202 Paresthesia of skin: Secondary | ICD-10-CM

## 2013-02-23 DIAGNOSIS — F411 Generalized anxiety disorder: Secondary | ICD-10-CM

## 2013-02-23 DIAGNOSIS — M5380 Other specified dorsopathies, site unspecified: Secondary | ICD-10-CM

## 2013-02-23 DIAGNOSIS — R209 Unspecified disturbances of skin sensation: Secondary | ICD-10-CM

## 2013-02-23 DIAGNOSIS — M62838 Other muscle spasm: Secondary | ICD-10-CM

## 2013-02-23 MED ORDER — OXYCODONE-ACETAMINOPHEN 5-325 MG PO TABS: 1.0000 | ORAL_TABLET | Freq: Three times a day (TID) | ORAL | Status: DC | PRN

## 2013-02-23 MED ORDER — ALPRAZOLAM 0.25 MG PO TABS: ORAL_TABLET | ORAL | Status: DC
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
## 2013-02-28 ENCOUNTER — Encounter (INDEPENDENT_AMBULATORY_CARE_PROVIDER_SITE_OTHER): Payer: BC Managed Care – PPO | Admitting: Radiology

## 2013-02-28 DIAGNOSIS — G5600 Carpal tunnel syndrome, unspecified upper limb: Secondary | ICD-10-CM

## 2013-03-07 ENCOUNTER — Encounter: Payer: Self-pay | Admitting: Neurology

## 2013-03-07 ENCOUNTER — Other Ambulatory Visit (HOSPITAL_COMMUNITY): Payer: Self-pay | Admitting: Physical Medicine and Rehabilitation

## 2013-03-07 DIAGNOSIS — M545 Low back pain, unspecified: Secondary | ICD-10-CM

## 2013-03-09 ENCOUNTER — Other Ambulatory Visit (HOSPITAL_COMMUNITY): Payer: BC Managed Care – PPO

## 2013-03-10 ENCOUNTER — Ambulatory Visit (HOSPITAL_COMMUNITY)
Admission: RE | Admit: 2013-03-10 | Discharge: 2013-03-10 | Disposition: A | Discharge status: Home / Self Care | Payer: BC Managed Care – PPO | Source: Ambulatory Visit | Attending: Physical Medicine and Rehabilitation | Admitting: Physical Medicine and Rehabilitation

## 2013-03-10 ENCOUNTER — Ambulatory Visit (HOSPITAL_COMMUNITY): Payer: BC Managed Care – PPO

## 2013-03-10 DIAGNOSIS — M545 Low back pain, unspecified: Secondary | ICD-10-CM

## 2013-03-10 DIAGNOSIS — M549 Dorsalgia, unspecified: Secondary | ICD-10-CM | POA: Insufficient documentation

## 2013-03-15 ENCOUNTER — Other Ambulatory Visit (HOSPITAL_COMMUNITY): Payer: Self-pay | Admitting: Obstetrics

## 2013-03-15 DIAGNOSIS — N938 Other specified abnormal uterine and vaginal bleeding: Secondary | ICD-10-CM

## 2013-03-15 LAB — CYTOLOGY - PAP: Pap: NEGATIVE

## 2013-03-20 ENCOUNTER — Ambulatory Visit (HOSPITAL_COMMUNITY)
Admission: RE | Admit: 2013-03-20 | Discharge: 2013-03-20 | Disposition: A | Discharge status: Home / Self Care | Payer: BC Managed Care – PPO | Source: Ambulatory Visit | Attending: Obstetrics | Admitting: Obstetrics

## 2013-03-20 DIAGNOSIS — N938 Other specified abnormal uterine and vaginal bleeding: Secondary | ICD-10-CM | POA: Insufficient documentation

## 2013-03-20 DIAGNOSIS — N925 Other specified irregular menstruation: Secondary | ICD-10-CM | POA: Insufficient documentation

## 2013-03-20 DIAGNOSIS — N949 Unspecified condition associated with female genital organs and menstrual cycle: Secondary | ICD-10-CM | POA: Insufficient documentation

## 2013-03-21 ENCOUNTER — Encounter: Payer: Self-pay | Admitting: Diagnostic Neuroimaging

## 2013-03-21 ENCOUNTER — Ambulatory Visit (INDEPENDENT_AMBULATORY_CARE_PROVIDER_SITE_OTHER): Payer: BC Managed Care – PPO | Admitting: Diagnostic Neuroimaging

## 2013-03-21 DIAGNOSIS — R209 Unspecified disturbances of skin sensation: Secondary | ICD-10-CM

## 2013-03-21 DIAGNOSIS — R6889 Other general symptoms and signs: Secondary | ICD-10-CM

## 2013-03-21 DIAGNOSIS — R200 Anesthesia of skin: Secondary | ICD-10-CM

## 2013-03-21 DIAGNOSIS — G5601 Carpal tunnel syndrome, right upper limb: Secondary | ICD-10-CM

## 2013-03-21 DIAGNOSIS — G5602 Carpal tunnel syndrome, left upper limb: Secondary | ICD-10-CM

## 2013-03-21 MED ORDER — GABAPENTIN 300 MG PO CAPS: 300.0000 mg | ORAL_CAPSULE | Freq: Three times a day (TID) | ORAL | Status: DC

## 2013-03-21 NOTE — Procedures (Signed)
   [facility]  NCS (NERVE CONDUCTION STUDY) WITH EMG (ELECTROMYOGRAPHY) REPORT   STUDY DATE: 02/28/13 (NCS), 03/21/13 (EMG) PATIENT NAME: Amanda [name] DOB: December 11, 1965 MRN: [id number]  ORDERING CLINICIAN: Joycelyn Schmid, MD   TECHNOLOGIST: Kaylyn Lim ELECTROMYOGRAPHER: Glenford Bayley. Grant Swager, MD  CLINICAL INFORMATION: 47 year old female with bilateral hand and foot numbness. History of right carpal tunnel release surgery. History of low back pain.   FINDINGS: NERVE CONDUCTION STUDY: Bilateral median motor responses have prolonged distal latencies (right 4.2 ms, left 4.7 ms, normal less than or equal to 4.2 ms), normal attitudes, conduction velocities and F-wave latencies. Bilateral ulnar and peroneal motor responses and F-wave latencies are normal. Bilateral tibial motor responses have normal conduction velocities and F-wave latencies, but decreased amplitude with proximal stimulation of the right side and decreased amplitude with proximal and distal similar to the left side. Patient could not tolerate higher stimulation of the tibial nerves in the popliteal fossae. Right median, bilateral ulnar and bilateral sural sensory response is normal. Left median sensory response is normal at [facility] and slow conduction velocity (39 m/s).  NEEDLE ELECTROMYOGRAPHY: Needle examination of selected muscles of right lower extremity (vastus medialis, tibialis anterior, gastrocnemius) shows no abnormal spontaneous activity at rest and normal motor unit recruitment on exertion.  IMPRESSION:  Abnormal study demonstrating: 1. Left median neuropathy at the wrist consistent with left carpal tunnel syndrome. Borderline right carpal tunnel syndrome also noted. 2. Decreased tibial motor nerve amplitudes may be related to motor neuropathy, lumbar radiculopathy or technical factors. No other supportive evidence is present. Clinical context suggests lumbar etiology.   INTERPRETING PHYSICIAN:  Suanne Marker, MD Certified in Neurology, Neurophysiology and Neuroimaging  [facility] [address] [place], [place] [address] [phone]

## 2013-03-27 ENCOUNTER — Emergency Department (INDEPENDENT_AMBULATORY_CARE_PROVIDER_SITE_OTHER)
Admission: EM | Admit: 2013-03-27 | Discharge: 2013-03-27 | Disposition: A | Discharge status: Home / Self Care | Payer: BC Managed Care – PPO | Source: Home / Self Care

## 2013-03-27 ENCOUNTER — Encounter (HOSPITAL_COMMUNITY): Payer: Self-pay | Admitting: *Deleted

## 2013-03-27 DIAGNOSIS — K0889 Other specified disorders of teeth and supporting structures: Secondary | ICD-10-CM

## 2013-03-27 DIAGNOSIS — K089 Disorder of teeth and supporting structures, unspecified: Secondary | ICD-10-CM

## 2013-03-27 HISTORY — DX: Polyneuropathy, unspecified: G62.9

## 2013-03-27 HISTORY — DX: Gastro-esophageal reflux disease without esophagitis: K21.9

## 2013-03-27 MED ORDER — HYDROCODONE-ACETAMINOPHEN 5-325 MG PO TABS
1.0000 | ORAL_TABLET | Freq: Once | ORAL | Status: AC
  Administered 2013-03-27: 1 via ORAL

## 2013-03-27 MED ORDER — HYDROCODONE-ACETAMINOPHEN 5-325 MG PO TABS
ORAL_TABLET | ORAL | Status: AC
  Filled 2013-03-27: qty 1

## 2013-03-27 MED ORDER — HYDROCODONE-ACETAMINOPHEN 5-325 MG PO TABS: 1.0000 | ORAL_TABLET | Freq: Once | ORAL | Status: DC

## 2013-03-27 MED ORDER — CLINDAMYCIN HCL 150 MG PO CAPS: 150.0000 mg | ORAL_CAPSULE | Freq: Four times a day (QID) | ORAL | Status: DC

## 2013-03-27 MED ORDER — HYDROCODONE-ACETAMINOPHEN 5-325 MG PO TABS: 1.0000 | ORAL_TABLET | Freq: Four times a day (QID) | ORAL | Status: DC | PRN

## 2013-03-27 NOTE — ED Notes (Signed)
Pt verified she has a ride home. 

## 2013-03-27 NOTE — ED Notes (Signed)
C/O left lower toothache x 1 wk with severe worsening.  Has been taking Tyl without relief.  Pt frowning in pain.

## 2013-03-27 NOTE — Discharge Instructions (Signed)
Thank you for coming in today. Please take the antibitocs.  Also take the norco for pain as needed.  Please call [facility] at [phone] to get the details to get your tooth pulled.    Dental Pain A tooth ache may be caused by cavities (tooth decay). Cavities expose the nerve of the tooth to air and hot or cold temperatures. It may come from an infection or abscess (also called a boil or furuncle) around your tooth. It is also often caused by dental caries (tooth decay). This causes the pain you are having. DIAGNOSIS  Your caregiver can diagnose this problem by exam. TREATMENT   If caused by an infection, it may be treated with medications which kill germs (antibiotics) and pain medications as prescribed by your caregiver. Take medications as directed.  Only take over-the-counter or prescription medicines for pain, discomfort, or fever as directed by your caregiver.  Whether the tooth ache today is caused by infection or dental disease, you should see your dentist as soon as possible for further care. SEEK MEDICAL CARE IF: The exam and treatment you received today has been provided on an emergency basis only. This is not a substitute for complete medical or dental care. If your problem worsens or new problems (symptoms) appear, and you are unable to meet with your dentist, call or return to this location. SEEK IMMEDIATE MEDICAL CARE IF:   You have a fever.  You develop redness and swelling of your face, jaw, or neck.  You are unable to open your mouth.  You have severe pain uncontrolled by pain medicine. MAKE SURE YOU:   Understand these instructions.  Will watch your condition.  Will get help right away if you are not doing well or get worse. Document Released: 08/10/2005 Document Revised: 11/02/2011 Document Reviewed: 03/28/2008 [facility] Patient Information 2014 [place], [place].

## 2013-03-27 NOTE — ED Provider Notes (Signed)
Amanda [name] is a 47 y.o. female who presents to Urgent Care today for lower left tooth pain. Present for about one week. Pain is worse with eating and quite intense. The pain does not radiate. No fevers or chills. No nausea vomiting or diarrhea. She has tried over-the-counter pain medication which has been somewhat helpful. She does not have dental insurance and has not seen a dentist yet. She feels well otherwise.    PMH reviewed. Hypertension. Allergic to amoxicillin History  Substance Use Topics  . Smoking status: Never Smoker   . Smokeless tobacco: Not on file  . Alcohol Use: No   ROS as above Medications reviewed. No current facility-administered medications for this encounter.   Current Outpatient Prescriptions  Medication Sig Dispense Refill  . atenolol (TENORMIN) 50 MG tablet Take 50 mg by mouth daily.      . cloNIDine (CATAPRES) 0.1 MG tablet Take 0.1 mg by mouth 3 (three) times daily.      . hydrochlorothiazide (HYDRODIURIL) 25 MG tablet Take 25 mg by mouth daily.      Marland Kitchen omeprazole (PRILOSEC) 20 MG capsule Take 20 mg by mouth daily.      . clindamycin (CLEOCIN) 150 MG capsule Take 1 capsule (150 mg total) by mouth every 6 (six) hours.  40 capsule  0  . HYDROcodone-acetaminophen (NORCO/VICODIN) 5-325 MG per tablet Take 1 tablet by mouth every 6 (six) hours as needed for pain.  30 tablet  0  . [DISCONTINUED] gabapentin (NEURONTIN) 300 MG capsule Take 1 capsule (300 mg total) by mouth 3 (three) times daily.  90 capsule  11    Exam:  BP 130/81  Pulse 93  Temp(Src) 97.9 F (36.6 C) (Oral)  Resp 22  SpO2 100%  LMP 03/06/2013 Gen: Well NAD HEENT: EOMI,  MMM Teeth: Left lower molar broken with surrounding erythema of the gumline. Tender to touch.  Lungs: CTABL Nl WOB Heart: RRR no MRG Abd: NABS, NT, ND Exts: Non edematous BL  LE, warm and well perfused.   No results found for this or any previous visit (from the past 24 hour(s)). No results found.  Assessment and  Plan: 47 y.o. female with dental pain with broken tooth and dental infection.  Plan to treat with clindamycin and Norco.  Followup with Dentist.  Discussed warning signs or symptoms. Please see discharge instructions. Patient expresses understanding.      Rodolph Bong, MD 03/27/13 2004

## 2013-03-28 ENCOUNTER — Telehealth: Payer: Self-pay

## 2013-03-28 ENCOUNTER — Other Ambulatory Visit: Payer: Self-pay | Admitting: Family Medicine

## 2013-03-28 MED ORDER — ALPRAZOLAM 0.25 MG PO TABS: ORAL_TABLET | ORAL | Status: DC

## 2013-03-28 NOTE — Telephone Encounter (Signed)
Alprazolam phoned to pharmacy.

## 2013-03-28 NOTE — Telephone Encounter (Signed)
Pt is requesting a refill for xanax

## 2013-03-29 NOTE — Telephone Encounter (Signed)
Alprazolam was phoned to pharmacy 03/28/13.

## 2013-04-27 ENCOUNTER — Telehealth: Payer: Self-pay

## 2013-04-27 NOTE — Telephone Encounter (Signed)
Pt is requesting a refill on xanax, but wants it called in to a different pharmacy Request called [facility] to [facility] [address] - [phone]

## 2013-04-28 MED ORDER — ALPRAZOLAM 0.25 MG PO TABS: ORAL_TABLET | ORAL | Status: DC

## 2013-04-28 NOTE — Telephone Encounter (Signed)
Changed pharmacy. Please advise on refill, pended.

## 2013-04-28 NOTE — Telephone Encounter (Signed)
Alprazolam refill phoned to [facility] at [phone] (#30 w/ no refills).

## 2013-05-05 ENCOUNTER — Encounter: Payer: Self-pay | Admitting: Family Medicine

## 2013-05-05 DIAGNOSIS — M549 Dorsalgia, unspecified: Secondary | ICD-10-CM | POA: Insufficient documentation

## 2013-05-25 ENCOUNTER — Ambulatory Visit (INDEPENDENT_AMBULATORY_CARE_PROVIDER_SITE_OTHER): Payer: BC Managed Care – PPO | Admitting: Family Medicine

## 2013-05-25 ENCOUNTER — Encounter: Payer: Self-pay | Admitting: Family Medicine

## 2013-05-25 VITALS — BP 110/71 | HR 92 | Temp 97.8°F | Resp 16 | Ht 68.0 in | Wt 215.0 lb

## 2013-05-25 DIAGNOSIS — K029 Dental caries, unspecified: Secondary | ICD-10-CM

## 2013-05-25 DIAGNOSIS — F411 Generalized anxiety disorder: Secondary | ICD-10-CM

## 2013-05-25 DIAGNOSIS — I10 Essential (primary) hypertension: Secondary | ICD-10-CM

## 2013-05-25 DIAGNOSIS — Z1159 Encounter for screening for other viral diseases: Secondary | ICD-10-CM

## 2013-05-25 MED ORDER — ALPRAZOLAM 0.25 MG PO TABS: ORAL_TABLET | ORAL | Status: DC

## 2013-05-25 MED ORDER — HYDROCHLOROTHIAZIDE 25 MG PO TABS: 25.0000 mg | ORAL_TABLET | Freq: Every day | ORAL | Status: DC

## 2013-05-25 MED ORDER — CLINDAMYCIN HCL 150 MG PO CAPS: 150.0000 mg | ORAL_CAPSULE | Freq: Four times a day (QID) | ORAL | Status: DC

## 2013-05-25 MED ORDER — CITALOPRAM HYDROBROMIDE 20 MG PO TABS: ORAL_TABLET | ORAL | Status: DC

## 2013-05-25 MED ORDER — CLONIDINE HCL 0.1 MG PO TABS: 0.1000 mg | ORAL_TABLET | Freq: Three times a day (TID) | ORAL | Status: DC

## 2013-05-25 NOTE — Patient Instructions (Signed)
I have prescribed a medication for you to treat anxiety. It is Citalopram (Brand- Celexa) 20 mg tablets. For the first 2 weeks, take 1/2 tablet every morning; increase to 1 tablet at week 3 and stay on that dose. I will see you again in 6 weeks. Stay on your blood pressure medications. Work on some weight loss for better health; exercise is great for stress reduction!

## 2013-05-25 NOTE — Progress Notes (Signed)
S: This 47 y.o. AA female is her for follow-up of anxiety; she takes Alprazolam almost daily for chronic anxiety. She feels tense and wound-up inside. Sleep disturbance is chronic also. Pt has reduced caffeine intake.  HTN- compliant w/ medications w/o adverse effects. Pt denies CP or tightness, palpitations, edema, SOB or DOE, HA, numbness, vision disturbances, weakness or syncope.  Pt has chronic paresthesias in hands (and feet). Recent eval at [facility] seems to confirm reoccurrence of Carpel Tunnel Syndrome. Pt is taking Gabapentin prescribed by Dr. Marjory Lies; she has no follow-up appointment.  Dental pain- pt has a large cavity in a molar; her dental insurance will not be in effect until Jan 2015. She plans to get tooth pulled next year but needs refill of antibiotic that she took in August. She has not been febrile, had n/v or HA or dizziness.  HCM- pt reports GYN/PAP exam in June 2014 by Dr. Gaynell Face.  Patient Active Problem List   Diagnosis Date Noted  . Pain in vertebral column 05/05/2013  . Carpal tunnel syndrome 11/18/2012  . Numbness and tingling of both legs below knees 11/18/2012  . Lumbago 11/18/2012  . Anxiety state, unspecified 11/18/2012   PMHx, Soc Hx and Medications reviewed. Pt works for [employer].  ROS: As per HPI. Otherwise noncontributory.  O: Filed Vitals:   05/25/13 1131  BP: 110/71  Pulse: 92  Temp: 97.8 F (36.6 C)  Resp: 16   GEN: In NAD; WN,WD. HENT: [place]/AT; EOMI w/ clear conj/sclerae. EACs/nose/ oroph unremarkable. NECK: Supple w/o LAN; thyroid gland -slight diffuse fullness w/o nodules or tenderness. COR: RRR. No edema. LUNGS: CTA; normal resp rate and effort. SKIN: W&D; no erythema, rashes or lesions. MS: MAEs; no deformities or effusions or muscle tenderness. NEURO: A&O x 3; CNs intact. Nonfocal.  A/P: HTN (hypertension) - Continue current medications and caffeine reduction. Try to increase physical activity.  Plan: Vit D   25 hydroxy (rtn osteoporosis monitoring), TSH, T3, Free, T4, free  Infected dental caries- RX: Clindamycin 150 mg 1 cap every 6 hours. Pt plans to see dentist.  Anxiety state, unspecified - Trial Citalopram 20 mg 1 tab every AM as directed.  Plan: Vit D  25 hydroxy , TSH, T3, Free, T4, free  Need for hepatitis C screening test - Plan: Hepatitis C antibody  Meds ordered this encounter  Medications  . gabapentin (NEURONTIN) 300 MG capsule    Sig: Take 300 mg by mouth 2 (two) times daily.  . citalopram (CELEXA) 20 MG tablet    Sig: Take 1/2 tablet every morning for 2 weeks then increase to 1 tablet every morning.    Dispense:  30 tablet    Refill:  5  . ALPRAZolam (XANAX) 0.25 MG tablet    Sig: Take 1 tablet by mouth every morning as needed for anxiety.    Dispense:  30 tablet    Refill:  0  . clindamycin (CLEOCIN) 150 MG capsule    Sig: Take 1 capsule (150 mg total) by mouth every 6 (six) hours.    Dispense:  40 capsule    Refill:  0  . cloNIDine (CATAPRES) 0.1 MG tablet    Sig: Take 1 tablet (0.1 mg total) by mouth 3 (three) times daily.    Dispense:  60 tablet    Refill:  5  . hydrochlorothiazide (HYDRODIURIL) 25 MG tablet    Sig: Take 1 tablet (25 mg total) by mouth daily.    Dispense:  30 tablet  Refill:  5

## 2013-05-26 ENCOUNTER — Encounter: Payer: Self-pay | Admitting: Family Medicine

## 2013-05-26 LAB — TSH: TSH: 0.755 u[IU]/mL (ref 0.350–4.500)

## 2013-05-26 LAB — VITAMIN D 25 HYDROXY (VIT D DEFICIENCY, FRACTURES): Vit D, 25-Hydroxy: 53 ng/mL (ref 30–89)

## 2013-05-26 LAB — T3, FREE: T3, Free: 2.9 pg/mL (ref 2.3–4.2)

## 2013-05-26 LAB — T4, FREE: Free T4: 1.12 ng/dL (ref 0.80–1.80)

## 2013-05-26 LAB — HEPATITIS C ANTIBODY: HCV Ab: NEGATIVE

## 2013-05-26 NOTE — Progress Notes (Signed)
Quick Note:  Please notify pt that results are normal.   Provide pt with copy of labs. ______ 

## 2013-06-26 ENCOUNTER — Other Ambulatory Visit: Payer: Self-pay

## 2013-06-26 NOTE — Telephone Encounter (Signed)
Please advise. Pt states if her ABX is not refilled she would still like her Xanax.

## 2013-06-26 NOTE — Telephone Encounter (Signed)
Pt is needing refill on her Xanax and her antibotic that she was given last time she was here. Pharmacy is [facility] on [address] back number is [phone]

## 2013-06-29 MED ORDER — ALPRAZOLAM 0.25 MG PO TABS: ORAL_TABLET | ORAL | Status: DC

## 2013-06-29 NOTE — Telephone Encounter (Signed)
Patient wants renewal on Alprazolam (pended), Dr Audria Nine out of the office message was sent to her on 11/3/ 14 please advise. Patient still has a lot of nasal congestion. Have reviewed OTC treatments with her regarding this and she understands.

## 2013-06-29 NOTE — Telephone Encounter (Signed)
Patient advised.

## 2013-07-04 NOTE — Telephone Encounter (Signed)
Alprazolam refill authorized by Ms. Debbra Riding, PA-C.

## 2013-07-06 ENCOUNTER — Ambulatory Visit: Payer: Self-pay | Admitting: Family Medicine

## 2013-07-25 ENCOUNTER — Ambulatory Visit (INDEPENDENT_AMBULATORY_CARE_PROVIDER_SITE_OTHER): Payer: BC Managed Care – PPO | Admitting: Family Medicine

## 2013-07-25 ENCOUNTER — Encounter: Payer: Self-pay | Admitting: Family Medicine

## 2013-07-25 VITALS — BP 130/72 | HR 75 | Temp 98.1°F | Resp 16 | Ht 68.0 in | Wt 223.0 lb

## 2013-07-25 DIAGNOSIS — F411 Generalized anxiety disorder: Secondary | ICD-10-CM

## 2013-07-25 DIAGNOSIS — I10 Essential (primary) hypertension: Secondary | ICD-10-CM | POA: Insufficient documentation

## 2013-07-25 DIAGNOSIS — G5600 Carpal tunnel syndrome, unspecified upper limb: Secondary | ICD-10-CM

## 2013-07-25 MED ORDER — GABAPENTIN 300 MG PO CAPS: ORAL_CAPSULE | ORAL | Status: DC

## 2013-07-25 MED ORDER — ALPRAZOLAM 0.25 MG PO TABS: ORAL_TABLET | ORAL | Status: DC

## 2013-07-25 NOTE — Patient Instructions (Signed)
[facility] has adult outpatient clinics; contact the hospital for information about where these clinic are located. The phone number for information is : [phone]. This is the main switchboard number; they can direct you to information about adult clinics. Let them know that you are interested in a clinic on [facility].

## 2013-07-26 NOTE — Progress Notes (Signed)
S:  This 47 y.o. AA female has HTN, well controlled on current medications. Pt denies medication side effects, fatigue, vision disturbances, CP or tightness, palpitations, SOB or DOE, edema, HA, dizziness, weakness, numbness or syncope.   Pt has chronic bilateral hand paresthesias w/ hx of CTS, s/p surgery. 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 She has tried night splints w/ minimal relief. Gabapentin is helpful; this med is sedating.  Anxiety is work related but symptoms are controlled w/ Alprazolam. Pt had allergic reaction to Citalopram and medication was discontinued.  Pt will be moving to a medical practice closer to her home; she has to arrange for transportation w/ family and friends. She might return to [facility] or one of the [facility] ambulatory care clinic.   Patient Active Problem List   Diagnosis Date Noted  . HTN (hypertension) 07/25/2013  . Pain in vertebral column 05/05/2013  . Carpal tunnel syndrome 11/18/2012  . Numbness and tingling of both legs below knees 11/18/2012  . Lumbago 11/18/2012  . Anxiety state, unspecified 11/18/2012   PMHx, Surg Hx, Soc and Fam Hx reviewed.  Medications reconciled.  ROS: AS per HPI.  O: Filed Vitals:   07/25/13 1550  BP: 130/72  Pulse: 75  Temp: 98.1 F (36.7 C)  Resp: 16   GEN: In NAD: WN,WD. HENT: [place]/AT; EOMI w/ clear conj/sclerae. Otherwise unremarkable. COR: RRR. LUNGS: Unlabored resp. SKIN: W&D; intact w/o diaphoresis, erythema or rashes. MS: Wrists- slightly swollen and decreased ROM. Tender w/ moderate palpation. NEURO: A&O x 3; CNs intact. Nonfocal.  A/P: Carpal tunnel syndrome, unspecified laterality- Increase bedtime dose of Gabapentin to 2 300 mg capsules; continue morning dose of 1 capsule.  Anxiety state, unspecified- Refill Alprazolam.  HTN (hypertension)- Stable and well controlled. No medication changes.  Meds ordered this encounter  Medications  . ALPRAZolam (XANAX)  0.25 MG tablet    Sig: Take 1 tablet by mouth twice a day as needed for anxiety.    Dispense:  60 tablet    Refill:  1  . gabapentin (NEURONTIN) 300 MG capsule    Sig: Take 1 capsule in the morning and 2 capsules at bedtime.    Dispense:  90 capsule    Refill:  1

## 2014-01-11 ENCOUNTER — Other Ambulatory Visit: Payer: Self-pay | Admitting: Gastroenterology

## 2014-01-11 DIAGNOSIS — R109 Unspecified abdominal pain: Secondary | ICD-10-CM

## 2014-01-18 ENCOUNTER — Other Ambulatory Visit: Payer: BC Managed Care – PPO

## 2014-01-22 ENCOUNTER — Ambulatory Visit
Admission: RE | Admit: 2014-01-22 | Discharge: 2014-01-22 | Disposition: A | Discharge status: Home / Self Care | Payer: BC Managed Care – PPO | Source: Ambulatory Visit | Attending: Gastroenterology | Admitting: Gastroenterology

## 2014-01-22 DIAGNOSIS — R109 Unspecified abdominal pain: Secondary | ICD-10-CM

## 2014-01-22 MED ORDER — IOHEXOL 300 MG/ML  SOLN
125.0000 mL | Freq: Once | INTRAMUSCULAR | Status: AC | PRN
  Administered 2014-01-22: 125 mL via INTRAVENOUS

## 2014-02-01 ENCOUNTER — Other Ambulatory Visit: Payer: Self-pay | Admitting: Gastroenterology

## 2014-02-01 DIAGNOSIS — R1013 Epigastric pain: Secondary | ICD-10-CM

## 2014-02-01 DIAGNOSIS — R1310 Dysphagia, unspecified: Secondary | ICD-10-CM

## 2014-02-07 ENCOUNTER — Ambulatory Visit (HOSPITAL_COMMUNITY)
Admission: RE | Admit: 2014-02-07 | Discharge: 2014-02-07 | Disposition: A | Discharge status: Home / Self Care | Payer: BC Managed Care – PPO | Source: Ambulatory Visit | Attending: Gastroenterology | Admitting: Gastroenterology

## 2014-02-07 DIAGNOSIS — R1013 Epigastric pain: Secondary | ICD-10-CM | POA: Insufficient documentation

## 2014-02-07 DIAGNOSIS — K8020 Calculus of gallbladder without cholecystitis without obstruction: Secondary | ICD-10-CM | POA: Insufficient documentation

## 2014-02-07 DIAGNOSIS — R1310 Dysphagia, unspecified: Secondary | ICD-10-CM

## 2014-02-08 ENCOUNTER — Inpatient Hospital Stay (HOSPITAL_COMMUNITY)
Admission: EM | Admit: 2014-02-08 | Discharge: 2014-02-12 | DRG: 419 | Disposition: A | Discharge status: Home / Self Care | Payer: BC Managed Care – PPO | Attending: General Surgery | Admitting: General Surgery

## 2014-02-08 ENCOUNTER — Encounter (HOSPITAL_COMMUNITY): Payer: Self-pay | Admitting: Emergency Medicine

## 2014-02-08 ENCOUNTER — Emergency Department (HOSPITAL_COMMUNITY): Payer: BC Managed Care – PPO

## 2014-02-08 DIAGNOSIS — Z8249 Family history of ischemic heart disease and other diseases of the circulatory system: Secondary | ICD-10-CM

## 2014-02-08 DIAGNOSIS — K8000 Calculus of gallbladder with acute cholecystitis without obstruction: Secondary | ICD-10-CM | POA: Insufficient documentation

## 2014-02-08 DIAGNOSIS — K8010 Calculus of gallbladder with chronic cholecystitis without obstruction: Principal | ICD-10-CM | POA: Diagnosis present

## 2014-02-08 DIAGNOSIS — Z880 Allergy status to penicillin: Secondary | ICD-10-CM

## 2014-02-08 DIAGNOSIS — R209 Unspecified disturbances of skin sensation: Secondary | ICD-10-CM | POA: Diagnosis present

## 2014-02-08 DIAGNOSIS — G5790 Unspecified mononeuropathy of unspecified lower limb: Secondary | ICD-10-CM | POA: Diagnosis present

## 2014-02-08 DIAGNOSIS — K219 Gastro-esophageal reflux disease without esophagitis: Secondary | ICD-10-CM | POA: Diagnosis present

## 2014-02-08 DIAGNOSIS — Z800 Family history of malignant neoplasm of digestive organs: Secondary | ICD-10-CM

## 2014-02-08 DIAGNOSIS — F329 Major depressive disorder, single episode, unspecified: Secondary | ICD-10-CM | POA: Diagnosis present

## 2014-02-08 DIAGNOSIS — F411 Generalized anxiety disorder: Secondary | ICD-10-CM | POA: Diagnosis present

## 2014-02-08 DIAGNOSIS — K8020 Calculus of gallbladder without cholecystitis without obstruction: Secondary | ICD-10-CM

## 2014-02-08 DIAGNOSIS — F3289 Other specified depressive episodes: Secondary | ICD-10-CM | POA: Diagnosis present

## 2014-02-08 DIAGNOSIS — I10 Essential (primary) hypertension: Secondary | ICD-10-CM | POA: Diagnosis present

## 2014-02-08 DIAGNOSIS — G8929 Other chronic pain: Secondary | ICD-10-CM | POA: Diagnosis present

## 2014-02-08 DIAGNOSIS — E669 Obesity, unspecified: Secondary | ICD-10-CM | POA: Diagnosis present

## 2014-02-08 DIAGNOSIS — Z6833 Body mass index (BMI) 33.0-33.9, adult: Secondary | ICD-10-CM

## 2014-02-08 DIAGNOSIS — Z79899 Other long term (current) drug therapy: Secondary | ICD-10-CM

## 2014-02-08 DIAGNOSIS — K589 Irritable bowel syndrome without diarrhea: Secondary | ICD-10-CM | POA: Diagnosis present

## 2014-02-08 DIAGNOSIS — K829 Disease of gallbladder, unspecified: Secondary | ICD-10-CM | POA: Diagnosis present

## 2014-02-08 DIAGNOSIS — R109 Unspecified abdominal pain: Secondary | ICD-10-CM

## 2014-02-08 DIAGNOSIS — D72820 Lymphocytosis (symptomatic): Secondary | ICD-10-CM | POA: Diagnosis present

## 2014-02-08 LAB — COMPREHENSIVE METABOLIC PANEL
ALT: 26 U/L (ref 0–35)
AST: 28 U/L (ref 0–37)
Albumin: 4.5 g/dL (ref 3.5–5.2)
Alkaline Phosphatase: 67 U/L (ref 39–117)
BUN: 6 mg/dL (ref 6–23)
CO2: 26 mEq/L (ref 19–32)
Calcium: 9.8 mg/dL (ref 8.4–10.5)
Chloride: 97 mEq/L (ref 96–112)
Creatinine, Ser: 0.8 mg/dL (ref 0.50–1.10)
GFR calc Af Amer: >90 mL/min (ref >90)
GFR calc non Af Amer: 86 mL/min — ABNORMAL LOW (ref >90)
Glucose, Bld: 86 mg/dL (ref 70–99)
Potassium: 3.3 mEq/L — ABNORMAL LOW (ref 3.7–5.3)
Sodium: 139 mEq/L (ref 137–147)
Total Bilirubin: 0.3 mg/dL (ref 0.3–1.2)
Total Protein: 8.1 g/dL (ref 6.0–8.3)

## 2014-02-08 LAB — CBC WITH DIFFERENTIAL/PLATELET
Basophils Absolute: 0 10*3/uL (ref 0.0–0.1)
Basophils Relative: 0 % (ref 0–1)
Eosinophils Absolute: 0.1 10*3/uL (ref 0.0–0.7)
Eosinophils Relative: 2 % (ref 0–5)
HCT: 39.3 % (ref 36.0–46.0)
Hemoglobin: 13.5 g/dL (ref 12.0–15.0)
Lymphocytes Relative: 59 % — ABNORMAL HIGH (ref 12–46)
Lymphs Abs: 3.8 10*3/uL (ref 0.7–4.0)
MCH: 33 pg (ref 26.0–34.0)
MCHC: 34.4 g/dL (ref 30.0–36.0)
MCV: 96.1 fL (ref 78.0–100.0)
Monocytes Absolute: 0.5 10*3/uL (ref 0.1–1.0)
Monocytes Relative: 7 % (ref 3–12)
Neutro Abs: 2.1 10*3/uL (ref 1.7–7.7)
Neutrophils Relative %: 32 % — ABNORMAL LOW (ref 43–77)
Platelets: 254 10*3/uL (ref 150–400)
RBC: 4.09 MIL/uL (ref 3.87–5.11)
RDW: 12.5 % (ref 11.5–15.5)
WBC: 6.5 10*3/uL (ref 4.0–10.5)

## 2014-02-08 LAB — TROPONIN I: Troponin I: <0.3 ng/mL (ref <0.30)

## 2014-02-08 LAB — LIPASE, BLOOD: Lipase: 20 U/L (ref 11–59)

## 2014-02-08 MED ORDER — ONDANSETRON HCL 4 MG/2ML IJ SOLN: 4.0000 mg | Freq: Four times a day (QID) | INTRAMUSCULAR | Status: DC | PRN

## 2014-02-08 MED ORDER — MORPHINE SULFATE 2 MG/ML IJ SOLN
1.0000 mg | INTRAMUSCULAR | Status: DC | PRN
  Administered 2014-02-09: 2 mg via INTRAVENOUS
  Administered 2014-02-09 (×4): 4 mg via INTRAVENOUS
  Administered 2014-02-09: 2 mg via INTRAVENOUS
  Filled 2014-02-08: qty 1
  Filled 2014-02-08 (×4): qty 2
  Filled 2014-02-08: qty 1

## 2014-02-08 MED ORDER — IOHEXOL 300 MG/ML  SOLN
50.0000 mL | Freq: Once | INTRAMUSCULAR | Status: AC | PRN
  Administered 2014-02-08: 50 mL via ORAL

## 2014-02-08 MED ORDER — MORPHINE SULFATE 4 MG/ML IJ SOLN
4.0000 mg | Freq: Once | INTRAMUSCULAR | Status: AC
  Administered 2014-02-08: 4 mg via INTRAVENOUS
  Filled 2014-02-08: qty 1

## 2014-02-08 MED ORDER — ONDANSETRON HCL 4 MG/2ML IJ SOLN
4.0000 mg | Freq: Once | INTRAMUSCULAR | Status: AC
  Administered 2014-02-08: 4 mg via INTRAVENOUS
  Filled 2014-02-08: qty 2

## 2014-02-08 MED ORDER — KCL IN DEXTROSE-NACL 20-5-0.45 MEQ/L-%-% IV SOLN
INTRAVENOUS | Status: DC
  Administered 2014-02-09 (×2): via INTRAVENOUS
  Filled 2014-02-08 (×4): qty 1000

## 2014-02-08 MED ORDER — TRAZODONE HCL 50 MG PO TABS
300.0000 mg | ORAL_TABLET | Freq: Every evening | ORAL | Status: DC | PRN
  Administered 2014-02-09: 300 mg via ORAL
  Filled 2014-02-08: qty 6

## 2014-02-08 MED ORDER — PANTOPRAZOLE SODIUM 40 MG IV SOLR
40.0000 mg | Freq: Two times a day (BID) | INTRAVENOUS | Status: DC
  Administered 2014-02-09 (×2): 40 mg via INTRAVENOUS
  Filled 2014-02-08 (×3): qty 40

## 2014-02-08 MED ORDER — FAMOTIDINE IN NACL 20-0.9 MG/50ML-% IV SOLN
20.0000 mg | Freq: Once | INTRAVENOUS | Status: AC
  Administered 2014-02-08: 20 mg via INTRAVENOUS
  Filled 2014-02-08: qty 50

## 2014-02-08 MED ORDER — CIPROFLOXACIN IN D5W 400 MG/200ML IV SOLN
400.0000 mg | Freq: Two times a day (BID) | INTRAVENOUS | Status: DC
  Administered 2014-02-09 (×2): 400 mg via INTRAVENOUS
  Filled 2014-02-08 (×2): qty 200

## 2014-02-08 MED ORDER — IOHEXOL 300 MG/ML  SOLN
100.0000 mL | Freq: Once | INTRAMUSCULAR | Status: AC | PRN
  Administered 2014-02-08: 100 mL via INTRAVENOUS

## 2014-02-08 NOTE — ED Notes (Signed)
Pt sent over by PCP for gallstones, states she has been waiting for surgery for a year.

## 2014-02-08 NOTE — ED Provider Notes (Signed)
CSN: [id number]     Arrival date & time 02/08/14  1725 History   First MD Initiated Contact with Patient 02/08/14 1614     Chief Complaint  Patient presents with  . Cholelithiasis     (Consider location/radiation/quality/duration/timing/severity/associated sxs/prior Treatment) HPI Comments: Pt comes in with cc of abd pain. Abd pain x 1 year, recent had US Abd for epigastric and RUQ abd pain.  Pt has known gall stones. Reports worsening of pain, with nausea and emesis. No fevers, chills.   The history is provided by the patient.    Past Medical History  Diagnosis Date  . Depression   . Anxiety   . Carpal tunnel syndrome, bilateral   . GERD (gastroesophageal reflux disease)   . Neuropathy    Past Surgical History  Procedure Laterality Date  . Rotator cuff repair Right 2011  . Hammer toe fusion Bilateral 2008  . Carpal tunnel release Right 2003   Family History  Problem Relation Age of Onset  . Cancer Mother     pancreatic  . Heart failure Father   . Cancer Paternal Grandmother    History  Substance Use Topics  . Smoking status: Never Smoker   . Smokeless tobacco: Not on file  . Alcohol Use: No   OB History   Grav Para Term Preterm Abortions TAB SAB Ect Mult Living                 Review of Systems  Constitutional: Positive for activity change.  HENT: Negative for facial swelling.   Respiratory: Negative for cough, shortness of breath and wheezing.   Cardiovascular: Negative for chest pain.  Gastrointestinal: Positive for nausea, vomiting and abdominal pain. Negative for diarrhea, constipation, blood in stool and abdominal distention.  Genitourinary: Negative for dysuria, hematuria and difficulty urinating.  Musculoskeletal: Negative for neck pain.  Skin: Negative for color change.  Neurological: Negative for speech difficulty and headaches.  Hematological: Does not bruise/bleed easily.  Psychiatric/Behavioral: Negative for confusion.      Allergies   Penicillins  Home Medications   Prior to Admission medications   Medication Sig Start Date End Date Taking? Authorizing Provider  ALPRAZolam Prudy Feeler(XANAX) 0.25 MG tablet Take 1 tablet by mouth twice a day as needed for anxiety. 07/25/13  Yes Maurice MarchBarbara B McPherson, MD  atenolol (TENORMIN) 50 MG tablet Take 50 mg by mouth daily.   Yes Historical Provider, MD  cloNIDine (CATAPRES) 0.1 MG tablet Take 1 tablet (0.1 mg total) by mouth 3 (three) times daily. 05/25/13  Yes Maurice MarchBarbara B McPherson, MD  gabapentin (NEURONTIN) 300 MG capsule Take 1 capsule in the morning and 2 capsules at bedtime. 07/25/13  Yes Maurice MarchBarbara B McPherson, MD  hydrochlorothiazide (HYDRODIURIL) 25 MG tablet Take 1 tablet (25 mg total) by mouth daily. 05/25/13  Yes Maurice MarchBarbara B McPherson, MD  hydrOXYzine (VISTARIL) 50 MG capsule Take 50 mg by mouth 2 (two) times daily as needed for anxiety.   Yes Historical Provider, MD  mirtazapine (REMERON) 30 MG tablet Take 30 mg by mouth at bedtime.   Yes Historical Provider, MD  omeprazole (PRILOSEC) 20 MG capsule Take 20 mg by mouth daily.   Yes Historical Provider, MD  OxyCODONE (OXYCONTIN) 20 mg T12A 12 hr tablet Take 20 mg by mouth every 12 (twelve) hours.   Yes Historical Provider, MD  sucralfate (CARAFATE) 1 G tablet Take 1 g by mouth 4 (four) times daily -  with meals and at bedtime.   Yes Historical Provider, MD  trazodone (DESYREL) 300 MG tablet Take 300 mg by mouth at bedtime.   Yes Historical Provider, MD   BP 133/77  Pulse 64  Temp(Src) 97.9 F (36.6 C) (Oral)  Resp 18  SpO2 97%  LMP 02/08/2014 Physical Exam  Nursing note and vitals reviewed. Constitutional: She is oriented to person, place, and time. She appears well-developed and well-nourished.  HENT:  Head: Normocephalic and atraumatic.  Eyes: EOM are normal. Pupils are equal, round, and reactive to light.  Neck: Neck supple.  Cardiovascular: Normal rate, regular rhythm and normal heart sounds.   No murmur heard. Pulmonary/Chest:  Effort normal. No respiratory distress.  Abdominal: Soft. She exhibits no distension. There is tenderness. There is guarding. There is no rebound.  Neurological: She is alert and oriented to person, place, and time.  Skin: Skin is warm and dry.    ED Course  Procedures (including critical care time) Labs Review Labs Reviewed  CBC WITH DIFFERENTIAL - Abnormal; Notable for the following:    Neutrophils Relative % 32 (*)    Lymphocytes Relative 59 (*)    All other components within normal limits  COMPREHENSIVE METABOLIC PANEL - Abnormal; Notable for the following:    Potassium 3.3 (*)    GFR calc non Af Amer 86 (*)    All other components within normal limits  LIPASE, BLOOD  TROPONIN I    Imaging Review [place] Abdomen Complete  02/07/2014   CLINICAL DATA:  Epigastric pain  EXAM: ULTRASOUND ABDOMEN COMPLETE  COMPARISON:  None.  FINDINGS: Gallbladder:  There are gallstones within Phrygian cap. There is no gallbladder wall thickening or pericholecystic fluid. Negative sonographic Murphy sign.  Common bile duct:  Diameter: 8.7 mm.  Liver:  No focal lesion identified.  Increased echogenicity.  IVC:  No abnormality visualized.  Pancreas:  Visualized portion unremarkable.  Spleen:  Size and appearance within normal limits.  Right Kidney:  Length: 10.7 cm. Echogenicity within normal limits. No mass or hydronephrosis visualized.  Left Kidney:  Length: 12.3 cm. There is a 10 mm left upper pole renal mass with increased through transmission most consistent with a small cyst. No hydronephrosis. Echogenicity within normal limits.  Abdominal aorta:  No aneurysm visualized.  Other findings:  None.  IMPRESSION: 1. Cholelithiasis within a Phrygian cap without sonographic evidence of acute cholecystitis. 2. Increased hepatic echogenicity as can be seen with hepatic steatosis. 3. Mildly dilated common bile duct without choledocholithiasis or obstructing mass. The pancreas is suboptimally visualized secondary to  overlying bowel gas. If there is further clinical concern recommend MRCP.   Electronically Signed   By: Elige KoHetal  Patel   On: 02/07/2014 10:05   Dg Esophagus  02/07/2014   CLINICAL DATA:  Patient reports dysphagia. Status post esophageal dilatation.  EXAM: ESOPHOGRAM/BARIUM SWALLOW  TECHNIQUE: Combined double contrast and single contrast examination performed using effervescent crystals, thick barium liquid, and thin barium liquid.  FLUOROSCOPY TIME:  1 min and 5 seconds  COMPARISON:  None.  FINDINGS: Esophageal peristalsis is normal. Esophageal mucosa an contour are normal. Gastroesophageal junction opens widely. No evidence for hiatal hernia or gastroesophageal reflux during the exam.  Dedicated views of the cervical esophagus are normal. Barium tablet swallowed without difficulty.  The patient experienced coughing and multiple points ring exam. However, no evidence for aspiration or penetration of contrast.  IMPRESSION: 1. Normal evaluation of the esophagus. 2. No aspiration.   Electronically Signed   By: Rosalie GumsBeth  Brown M.D.   On: 02/07/2014 11:50  EKG Interpretation None      MDM   Final diagnoses:  Symptomatic cholelithiasis    Pt with RUQ pain. Has guarding - appears to be symptomatic cholelithiasis. Surgery consulted, and they are admitting.    Derwood Kaplan, MD 02/09/14 [id number]

## 2014-02-08 NOTE — ED Notes (Signed)
MD at bedside. Dr. Ezzard StandingNewman, CCS.

## 2014-02-08 NOTE — H&P (Addendum)
Re:   Amanda [name] DOB:   [birth year] MRN:   [id number]  [facility] Admission  ASSESSMENT AND PLAN: 1.  Gall bladder disease  I am not convinced that all her abdominal symptoms are coming from her gall bladder.  But she has stones and has right sided abdominal pain.   I discussed with the patient the indications and risks of gall bladder surgery.  The primary risks of gall bladder surgery include, but are not limited to, bleeding, infection, common bile duct injury, and open surgery.  There is also the risk that the patient may have continued symptoms after surgery.  We discussed the typical post-operative recovery course. I tried to answer the patient's questions.  I explained to her that Amanda [name] is our surgeon during the day tomorrow and he would be the surgeon.  If he is too busy, the surgery could be delayed until and available time.  2.  Depression/anxiety  Sees Amanda [name], a therapist ever 3 or 4 months  3.  GERD 4.  Neuropathy  Mainly feet 5.  HTN 6.  Chronic pain meds, though she is very elusive about how often she takes the oxycodone - she takes 20 mg at a time 7.  History of ulcer disease over 10 years ago  Had recent upper endo by Amanda [name] which ulcer was not seen 8.  IBS for years 9.  She complains of some headaches 10. Relative lymphocytosis on the CBC  Chief Complaint  Patient presents with  . Cholelithiasis   REFERRING PHYSICIAN:  Dr. Genevie [name], [facility]  HISTORY OF PRESENT ILLNESS: Amanda [name] is a 48 y.o. (DOB: [birth year])  AA  female whose primary care physician is Amanda ProvidenceANDERSON,TAKELA N., FNP [facility]. Girlfriend, Amanda [name], with her in ER.  Ms Amanda [name] dates her abdominal problems over a year. She is somewhat hard to understand and she does not enunciate her words.  [place], but she does not have a accent that is hard to follow. She says that she has abdominal swelling, trouble eating, which has gotten  worse the last 2 or 3 weeks.  She also says that she has lost over 10 pounds.   She had a CT scan of her abdomen 01/22/2014 which was negative. She has seen Amanda [name], but there are no notes in Epic from Amanda [name].  She has had a upper endoscopy about 2 weeks.  The patient said that Amanda [name] stretched her esophagus and did not see an ulcer.  She had an barium swallow yesterday. The patient has a long history of IBS.  This is helped with some pepto bismol.  She had a stomach ulcer remotely.  She has had no abdominal surgery.  CT abdomen - 01/22/2014 - negative US abdomen - 02/07/2014 - 1. Cholelithiasis within a Phrygian cap without sonographic evidence of acute cholecystitis.  2. Increased hepatic echogenicity as can be seen with hepatic steatosis.  3. Mildly dilated common bile duct without choledocholithiasis or obstructing mass. The pancreas is suboptimally visualized secondary to overlying bowel gas. If there is further clinical concern recommend MRCP. WBC - 02/08/2014 - 6,500, 32% Neutrophils, 59% lymphocytes   Past Medical History  Diagnosis Date  . Depression   . Anxiety   . Carpal tunnel syndrome, bilateral   . GERD (gastroesophageal reflux disease)   . Neuropathy      Past Surgical History  Procedure Laterality Date  .  Rotator cuff repair Right 2011  . Hammer toe fusion Bilateral 2008  . Carpal tunnel release Right 2003    No current facility-administered medications for this encounter.   Current Outpatient Prescriptions  Medication Sig Dispense Refill  . ALPRAZolam (XANAX) 0.25 MG tablet Take 1 tablet by mouth twice a day as needed for anxiety.  60 tablet  1  . atenolol (TENORMIN) 50 MG tablet Take 50 mg by mouth daily.      . cloNIDine (CATAPRES) 0.1 MG tablet Take 1 tablet (0.1 mg total) by mouth 3 (three) times daily.  60 tablet  5  . gabapentin (NEURONTIN) 300 MG capsule Take 1 capsule in the morning and 2 capsules at bedtime.  90 capsule  1  . hydrochlorothiazide  (HYDRODIURIL) 25 MG tablet Take 1 tablet (25 mg total) by mouth daily.  30 tablet  5  . hydrOXYzine (VISTARIL) 50 MG capsule Take 50 mg by mouth 2 (two) times daily as needed for anxiety.      . mirtazapine (REMERON) 30 MG tablet Take 30 mg by mouth at bedtime.      Marland Kitchen. omeprazole (PRILOSEC) 20 MG capsule Take 20 mg by mouth daily.      . OxyCODONE (OXYCONTIN) 20 mg T12A 12 hr tablet Take 20 mg by mouth every 12 (twelve) hours.      . sucralfate (CARAFATE) 1 G tablet Take 1 g by mouth 4 (four) times daily -  with meals and at bedtime.      . trazodone (DESYREL) 300 MG tablet Take 300 mg by mouth at bedtime.          Allergies  Allergen Reactions  . Penicillins     Throat swelling    REVIEW OF SYSTEMS: Skin:  No history of rash.  No history of abnormal moles. Infection:  No history of hepatitis or HIV.  No history of MRSA. Neurologic:  Headaches recently. 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  HTN.  No history of seeing a cardiologist. Pulmonary:  Does not smoke cigarettes.  No asthma or bronchitis.  No OSA/CPAP.  Endocrine:  No diabetes. No thyroid disease. Gastrointestinal:   No history of liver disease.  No history of pancreas disease.  No history of colon disease.  Never had colonoscopy.  Amanda [name] had one scheduled, but this was canceled. Urologic:  No history of kidney stones.  No history of bladder infections. Musculoskeletal:  Right carpel tunnel surgery - 2005.  Hammer toe surgery - 2008. Right rotator cuff surgery by Amanda [name] - [place] - 2011.  Seeing Amanda [name] for left rotator cuff problems, but no surgery planned.  Amanda [name] is the one who gives her 20 mg oxycodone.  She thinks that she takes about 10 to 12 per month. Hematologic:  No bleeding disorder.  No history of anemia.  Not anticoagulated. Psycho-social:  Depression and anxiety.  Sees Amanda [name] as a therapist.  SOCIAL and FAMILY HISTORY: Unmarried.  No children. Girlfriend, Amanda [name], with her in ER.   She lives with someone else. 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  PHYSICAL EXAM: BP 124/74  Pulse 69  Temp(Src) 97.9 F (36.6 C) (Oral)  Resp 22  SpO2 94%  LMP 02/08/2014  General: AA who is alert.  HEENT: Normal. Pupils equal. Neck: Supple. No mass.  No thyroid mass. Lymph Nodes:  No supraclavicular or cervical nodes. Lungs: Clear to auscultation and symmetric breath sounds. Heart:  RRR. No murmur  or rub. 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  Abdomen: Soft. No hernia. Normal bowel sounds.  No abdominal scars.  Right sided abdominal pain, but no peritoneal signs.  Her symptoms are not consistent. Rectal: Not done. Extremities:  Good strength and ROM  in upper and lower extremities. Neurologic:  Grossly intact to motor and sensory function. Psychiatric: She is a little difficult to talk to.  DATA REVIEWED: Epic notes and labs.  Ovidio Kin, MD,  [facility] [address]  [address]   [place], [place]    [address] Phone:  [phone] FAX:  [phone]

## 2014-02-08 NOTE — ED Notes (Signed)
Patient transported to CT 

## 2014-02-09 ENCOUNTER — Encounter (HOSPITAL_COMMUNITY): Payer: Self-pay | Admitting: Certified Registered Nurse Anesthetist

## 2014-02-09 ENCOUNTER — Encounter (HOSPITAL_COMMUNITY): Payer: BC Managed Care – PPO | Admitting: Anesthesiology

## 2014-02-09 ENCOUNTER — Encounter (HOSPITAL_COMMUNITY): Admission: EM | Disposition: A | Discharge status: Home / Self Care | Payer: Self-pay | Source: Home / Self Care

## 2014-02-09 ENCOUNTER — Inpatient Hospital Stay (HOSPITAL_COMMUNITY): Payer: BC Managed Care – PPO

## 2014-02-09 ENCOUNTER — Inpatient Hospital Stay (HOSPITAL_COMMUNITY): Payer: BC Managed Care – PPO | Admitting: Anesthesiology

## 2014-02-09 DIAGNOSIS — K8000 Calculus of gallbladder with acute cholecystitis without obstruction: Secondary | ICD-10-CM | POA: Insufficient documentation

## 2014-02-09 HISTORY — PX: CHOLECYSTECTOMY: SHX55

## 2014-02-09 LAB — COMPREHENSIVE METABOLIC PANEL
ALT: 21 U/L (ref 0–35)
AST: 22 U/L (ref 0–37)
Albumin: 3.7 g/dL (ref 3.5–5.2)
Alkaline Phosphatase: 61 U/L (ref 39–117)
BUN: 5 mg/dL — ABNORMAL LOW (ref 6–23)
CO2: 31 mEq/L (ref 19–32)
Calcium: 8.9 mg/dL (ref 8.4–10.5)
Chloride: 98 mEq/L (ref 96–112)
Creatinine, Ser: 0.9 mg/dL (ref 0.50–1.10)
GFR calc Af Amer: 86 mL/min — ABNORMAL LOW (ref >90)
GFR calc non Af Amer: 74 mL/min — ABNORMAL LOW (ref >90)
Glucose, Bld: 135 mg/dL — ABNORMAL HIGH (ref 70–99)
Potassium: 3.7 mEq/L (ref 3.7–5.3)
Sodium: 139 mEq/L (ref 137–147)
Total Bilirubin: 0.3 mg/dL (ref 0.3–1.2)
Total Protein: 7 g/dL (ref 6.0–8.3)

## 2014-02-09 LAB — CBC
HCT: 38 % (ref 36.0–46.0)
Hemoglobin: 12.7 g/dL (ref 12.0–15.0)
MCH: 32.5 pg (ref 26.0–34.0)
MCHC: 33.4 g/dL (ref 30.0–36.0)
MCV: 97.2 fL (ref 78.0–100.0)
Platelets: 228 10*3/uL (ref 150–400)
RBC: 3.91 MIL/uL (ref 3.87–5.11)
RDW: 12.6 % (ref 11.5–15.5)
WBC: 6.5 10*3/uL (ref 4.0–10.5)

## 2014-02-09 LAB — SURGICAL PCR SCREEN
MRSA, PCR: POSITIVE — AB
Staphylococcus aureus: POSITIVE — AB

## 2014-02-09 SURGERY — LAPAROSCOPIC CHOLECYSTECTOMY WITH INTRAOPERATIVE CHOLANGIOGRAM
Anesthesia: General

## 2014-02-09 MED ORDER — HYDROMORPHONE HCL PF 1 MG/ML IJ SOLN
0.2500 mg | INTRAMUSCULAR | Status: DC | PRN
  Administered 2014-02-09 (×4): 0.5 mg via INTRAVENOUS

## 2014-02-09 MED ORDER — MIDAZOLAM HCL 2 MG/2ML IJ SOLN
2.0000 mg | Freq: Once | INTRAMUSCULAR | Status: AC
Start: 2014-02-09 — End: 2014-02-09
  Administered 2014-02-09: 2 mg via INTRAVENOUS

## 2014-02-09 MED ORDER — SUCCINYLCHOLINE CHLORIDE 20 MG/ML IJ SOLN
INTRAMUSCULAR | Status: DC | PRN
  Administered 2014-02-09: 100 mg via INTRAVENOUS

## 2014-02-09 MED ORDER — NEOSTIGMINE METHYLSULFATE 10 MG/10ML IV SOLN
INTRAVENOUS | Status: AC
  Filled 2014-02-09: qty 1

## 2014-02-09 MED ORDER — ONDANSETRON HCL 4 MG/2ML IJ SOLN: 4.0000 mg | Freq: Four times a day (QID) | INTRAMUSCULAR | Status: DC | PRN

## 2014-02-09 MED ORDER — BUPIVACAINE LIPOSOME 1.3 % IJ SUSP
INTRAMUSCULAR | Status: DC | PRN
  Administered 2014-02-09: 19 mL

## 2014-02-09 MED ORDER — LACTATED RINGERS IV SOLN
INTRAVENOUS | Status: DC | PRN
  Administered 2014-02-09: 15:00:00 via INTRAVENOUS

## 2014-02-09 MED ORDER — EPHEDRINE SULFATE 50 MG/ML IJ SOLN
INTRAMUSCULAR | Status: AC
  Filled 2014-02-09: qty 1

## 2014-02-09 MED ORDER — PROPOFOL 10 MG/ML IV BOLUS
INTRAVENOUS | Status: DC | PRN
  Administered 2014-02-09: 200 mg via INTRAVENOUS

## 2014-02-09 MED ORDER — MIDAZOLAM HCL 5 MG/5ML IJ SOLN
INTRAMUSCULAR | Status: DC | PRN
  Administered 2014-02-09: 2 mg via INTRAVENOUS

## 2014-02-09 MED ORDER — FENTANYL CITRATE 0.05 MG/ML IJ SOLN
INTRAMUSCULAR | Status: AC
  Filled 2014-02-09: qty 5

## 2014-02-09 MED ORDER — CHLORHEXIDINE GLUCONATE CLOTH 2 % EX PADS: 6.0000 | MEDICATED_PAD | Freq: Every day | CUTANEOUS | Status: DC

## 2014-02-09 MED ORDER — HEPARIN SODIUM (PORCINE) 5000 UNIT/ML IJ SOLN
5000.0000 [IU] | Freq: Three times a day (TID) | INTRAMUSCULAR | Status: DC
  Administered 2014-02-10 – 2014-02-12 (×7): 5000 [IU] via SUBCUTANEOUS
  Filled 2014-02-09 (×10): qty 1

## 2014-02-09 MED ORDER — HYDROCODONE-ACETAMINOPHEN 5-325 MG PO TABS
1.0000 | ORAL_TABLET | ORAL | Status: DC | PRN
  Administered 2014-02-09: 1 via ORAL
  Administered 2014-02-10 (×2): 2 via ORAL
  Filled 2014-02-09: qty 2
  Filled 2014-02-09: qty 1
  Filled 2014-02-09: qty 2

## 2014-02-09 MED ORDER — GLYCOPYRROLATE 0.2 MG/ML IJ SOLN
INTRAMUSCULAR | Status: AC
  Filled 2014-02-09: qty 2

## 2014-02-09 MED ORDER — FENTANYL CITRATE 0.05 MG/ML IJ SOLN
INTRAMUSCULAR | Status: DC | PRN
  Administered 2014-02-09: 50 ug via INTRAVENOUS
  Administered 2014-02-09 (×2): 100 ug via INTRAVENOUS

## 2014-02-09 MED ORDER — IOHEXOL 300 MG/ML  SOLN
INTRAMUSCULAR | Status: DC | PRN
  Administered 2014-02-09: 9 mL via INTRAVENOUS

## 2014-02-09 MED ORDER — GLYCOPYRROLATE 0.2 MG/ML IJ SOLN
INTRAMUSCULAR | Status: DC | PRN
Start: 2014-02-09 — End: 2014-02-09
  Administered 2014-02-09: 0.4 mg via INTRAVENOUS

## 2014-02-09 MED ORDER — PROMETHAZINE HCL 25 MG/ML IJ SOLN
6.2500 mg | INTRAMUSCULAR | Status: DC | PRN
  Administered 2014-02-09: 6.25 mg via INTRAVENOUS

## 2014-02-09 MED ORDER — ONDANSETRON HCL 4 MG/2ML IJ SOLN
INTRAMUSCULAR | Status: AC
  Filled 2014-02-09: qty 2

## 2014-02-09 MED ORDER — BUPIVACAINE LIPOSOME 1.3 % IJ SUSP
20.0000 mL | Freq: Once | INTRAMUSCULAR | Status: DC
  Filled 2014-02-09: qty 20

## 2014-02-09 MED ORDER — DEXAMETHASONE SODIUM PHOSPHATE 10 MG/ML IJ SOLN
INTRAMUSCULAR | Status: AC
  Filled 2014-02-09: qty 1

## 2014-02-09 MED ORDER — LIDOCAINE HCL (CARDIAC) 20 MG/ML IV SOLN
INTRAVENOUS | Status: DC | PRN
  Administered 2014-02-09: 100 mg via INTRAVENOUS

## 2014-02-09 MED ORDER — MORPHINE SULFATE 2 MG/ML IJ SOLN
1.0000 mg | INTRAMUSCULAR | Status: DC | PRN
  Administered 2014-02-09 – 2014-02-10 (×4): 1 mg via INTRAVENOUS
  Filled 2014-02-09 (×4): qty 1

## 2014-02-09 MED ORDER — CISATRACURIUM BESYLATE (PF) 10 MG/5ML IV SOLN
INTRAVENOUS | Status: DC | PRN
  Administered 2014-02-09: 6 mg via INTRAVENOUS
  Administered 2014-02-09: 4 mg via INTRAVENOUS

## 2014-02-09 MED ORDER — MIDAZOLAM HCL 2 MG/2ML IJ SOLN
INTRAMUSCULAR | Status: AC
  Filled 2014-02-09: qty 2

## 2014-02-09 MED ORDER — ONDANSETRON HCL 4 MG/2ML IJ SOLN
INTRAMUSCULAR | Status: DC | PRN
Start: 2014-02-09 — End: 2014-02-09
  Administered 2014-02-09: 4 mg via INTRAVENOUS

## 2014-02-09 MED ORDER — KETOROLAC TROMETHAMINE 30 MG/ML IJ SOLN: 15.0000 mg | Freq: Once | INTRAMUSCULAR | Status: DC | PRN

## 2014-02-09 MED ORDER — MUPIROCIN 2 % EX OINT
1.0000 "application " | TOPICAL_OINTMENT | Freq: Two times a day (BID) | CUTANEOUS | Status: DC
  Administered 2014-02-09 (×2): 1 via NASAL
  Filled 2014-02-09: qty 22

## 2014-02-09 MED ORDER — HEPARIN SODIUM (PORCINE) 5000 UNIT/ML IJ SOLN
5000.0000 [IU] | Freq: Three times a day (TID) | INTRAMUSCULAR | Status: DC
  Filled 2014-02-09 (×2): qty 1

## 2014-02-09 MED ORDER — NEOSTIGMINE METHYLSULFATE 10 MG/10ML IV SOLN
INTRAVENOUS | Status: DC | PRN
  Administered 2014-02-09: 3 mg via INTRAVENOUS

## 2014-02-09 MED ORDER — KCL IN DEXTROSE-NACL 20-5-0.45 MEQ/L-%-% IV SOLN
INTRAVENOUS | Status: DC
  Administered 2014-02-10 – 2014-02-12 (×4): via INTRAVENOUS
  Filled 2014-02-09 (×7): qty 1000

## 2014-02-09 MED ORDER — LACTATED RINGERS IV SOLN
INTRAVENOUS | Status: DC
  Administered 2014-02-09: 1000 mL via INTRAVENOUS

## 2014-02-09 MED ORDER — HYDROMORPHONE HCL PF 1 MG/ML IJ SOLN
INTRAMUSCULAR | Status: AC
  Filled 2014-02-09: qty 1

## 2014-02-09 MED ORDER — PROPOFOL 10 MG/ML IV BOLUS
INTRAVENOUS | Status: AC
  Filled 2014-02-09: qty 20

## 2014-02-09 MED ORDER — DEXAMETHASONE SODIUM PHOSPHATE 10 MG/ML IJ SOLN
INTRAMUSCULAR | Status: DC | PRN
  Administered 2014-02-09: 10 mg via INTRAVENOUS

## 2014-02-09 MED ORDER — PROMETHAZINE HCL 25 MG/ML IJ SOLN
INTRAMUSCULAR | Status: AC
  Filled 2014-02-09: qty 1

## 2014-02-09 MED ORDER — ONDANSETRON HCL 4 MG PO TABS: 4.0000 mg | ORAL_TABLET | Freq: Four times a day (QID) | ORAL | Status: DC | PRN

## 2014-02-09 MED ORDER — LIDOCAINE HCL (CARDIAC) 20 MG/ML IV SOLN
INTRAVENOUS | Status: AC
  Filled 2014-02-09: qty 5

## 2014-02-09 MED ORDER — CISATRACURIUM BESYLATE 20 MG/10ML IV SOLN
INTRAVENOUS | Status: AC
  Filled 2014-02-09: qty 10

## 2014-02-09 SURGICAL SUPPLY — 38 items
APPLIER CLIP ROT 10 11.4 M/L (STAPLE) ×3
BENZOIN TINCTURE PRP APPL 2/3 (GAUZE/BANDAGES/DRESSINGS) IMPLANT
CABLE HIGH FREQUENCY MONO STRZ (ELECTRODE) IMPLANT
CATH REDDICK CHOLANGI 4FR 50CM (CATHETERS) ×3 IMPLANT
CLIP APPLIE ROT 10 11.4 M/L (STAPLE) ×1 IMPLANT
CLOSURE WOUND 1/2 X4 (GAUZE/BANDAGES/DRESSINGS)
COVER MAYO STAND STRL (DRAPES) ×3 IMPLANT
DECANTER SPIKE VIAL GLASS SM (MISCELLANEOUS) IMPLANT
DERMABOND ADVANCED (GAUZE/BANDAGES/DRESSINGS) ×2
DERMABOND ADVANCED .7 DNX12 (GAUZE/BANDAGES/DRESSINGS) ×1 IMPLANT
DRAPE C-ARM 42X120 X-RAY (DRAPES) ×3 IMPLANT
DRAPE LAPAROSCOPIC ABDOMINAL (DRAPES) ×3 IMPLANT
ELECT REM PT RETURN 9FT ADLT (ELECTROSURGICAL) ×3
ELECTRODE REM PT RTRN 9FT ADLT (ELECTROSURGICAL) ×1 IMPLANT
GLOVE BIOGEL M 8.0 STRL (GLOVE) ×3 IMPLANT
GOWN SPEC L4 XLG W/TWL (GOWN DISPOSABLE) ×3 IMPLANT
GOWN STRL REUS W/TWL XL LVL3 (GOWN DISPOSABLE) ×9 IMPLANT
HEMOSTAT SURGICEL 4X8 (HEMOSTASIS) IMPLANT
IV CATH 14GX2 1/4 (CATHETERS) ×3 IMPLANT
KIT BASIN OR (CUSTOM PROCEDURE TRAY) ×3 IMPLANT
POUCH RETRIEVAL ECOSAC 10 (ENDOMECHANICALS) ×1 IMPLANT
POUCH RETRIEVAL ECOSAC 10MM (ENDOMECHANICALS) ×2
POUCH SPECIMEN RETRIEVAL 10MM (ENDOMECHANICALS) IMPLANT
SCISSORS LAP 5X45 EPIX DISP (ENDOMECHANICALS) ×3 IMPLANT
SCRUB PCMX 4 OZ (MISCELLANEOUS) ×3 IMPLANT
SET IRRIG TUBING LAPAROSCOPIC (IRRIGATION / IRRIGATOR) ×3 IMPLANT
SLEEVE XCEL OPT CAN 5 100 (ENDOMECHANICALS) ×3 IMPLANT
SOLUTION ANTI FOG 6CC (MISCELLANEOUS) ×3 IMPLANT
STRIP CLOSURE SKIN 1/2X4 (GAUZE/BANDAGES/DRESSINGS) IMPLANT
SUT VIC AB 4-0 SH 18 (SUTURE) ×3 IMPLANT
SYRINGE 20CC LL (MISCELLANEOUS) ×3 IMPLANT
TOWEL OR 17X26 10 PK STRL BLUE (TOWEL DISPOSABLE) ×3 IMPLANT
TOWEL OR NON WOVEN STRL DISP B (DISPOSABLE) ×3 IMPLANT
TRAY LAP CHOLE (CUSTOM PROCEDURE TRAY) ×3 IMPLANT
TROCAR BLADELESS OPT 5 100 (ENDOMECHANICALS) ×3 IMPLANT
TROCAR XCEL BLUNT TIP 100MML (ENDOMECHANICALS) ×3 IMPLANT
TROCAR XCEL NON-BLD 11X100MML (ENDOMECHANICALS) ×3 IMPLANT
TUBING INSUFFLATION 10FT LAP (TUBING) ×3 IMPLANT

## 2014-02-09 NOTE — Care Management Note (Signed)
    Page 1 of 1   02/09/2014     11:22:31 AM CARE MANAGEMENT NOTE 02/09/2014  Patient:  Amanda [name],Amanda [name]   Account Number:  [id number]  Date Initiated:  02/09/2014  Documentation initiated by:  Lanier ClamMAHABIR,Jaziah Goeller  Subjective/Objective Assessment:   47 Y/O F ADMITTED W/ABD PAIN.CHOLELITHIASIS.     Action/Plan:   FROM HOME.   Anticipated DC Date:  02/10/2014   Anticipated DC Plan:  HOME/SELF CARE      DC Planning Services  CM consult      Choice offered to / List presented to:             Status of service:  In process, will continue to follow Medicare Important Message given?   (If response is "NO", the following Medicare IM given date fields will be blank) Date Medicare IM given:   Date Additional Medicare IM given:    Discharge Disposition:    Per UR Regulation:  Reviewed for med. necessity/level of care/duration of stay  If discussed at Long Length of Stay Meetings, dates discussed:    Comments:  02/09/14 Darcel Zick RN,BSN NCM [phone] FOR LAP CHOLE.NO ANTICIPATED D/C NEEDS.

## 2014-02-09 NOTE — Anesthesia Preprocedure Evaluation (Signed)
Anesthesia Evaluation  Patient identified by MRN, date of birth, ID band Patient awake    Reviewed: Allergy & Precautions, H&P , NPO status , Patient's Chart, lab work & pertinent test results  Airway Mallampati: II TM Distance: <3 FB Neck ROM: Full    Dental no notable dental hx.    Pulmonary neg pulmonary ROS,  breath sounds clear to auscultation  Pulmonary exam normal       Cardiovascular hypertension, Pt. on medications Rhythm:Regular Rate:Normal     Neuro/Psych negative neurological ROS  negative psych ROS   GI/Hepatic Neg liver ROS, GERD-  Medicated,  Endo/Other  obesity  Renal/GU negative Renal ROS  negative genitourinary   Musculoskeletal negative musculoskeletal ROS (+)   Abdominal   Peds negative pediatric ROS (+)  Hematology negative hematology ROS (+)   Anesthesia Other Findings   Reproductive/Obstetrics negative OB ROS                           Anesthesia Physical Anesthesia Plan  ASA: II  Anesthesia Plan: General   Post-op Pain Management:    Induction: Intravenous  Airway Management Planned: Oral ETT  Additional Equipment:   Intra-op Plan:   Post-operative Plan: Extubation in OR  Informed Consent: I have reviewed the patients History and Physical, chart, labs and discussed the procedure including the risks, benefits and alternatives for the proposed anesthesia with the patient or authorized representative who has indicated his/her understanding and acceptance.   Dental advisory given  Plan Discussed with: CRNA and Surgeon  Anesthesia Plan Comments:         Anesthesia Quick Evaluation

## 2014-02-09 NOTE — Progress Notes (Signed)
Patient ID: Amanda [name], female   DOB: 07-21-1966, 48 y.o.   MRN: [id number]    Subjective: Pt hungry.  Otherwise comfortable  Objective: Vital signs in last 24 hours: Temp:  [97.4 F (36.3 C)-97.9 F (36.6 C)] 97.5 F (36.4 C) (06/19 0502) Pulse Rate:  [57-69] 67 (06/19 0502) Resp:  [18-22] 18 (06/19 0502) BP: (116-149)/(74-85) 120/74 mmHg (06/19 0502) SpO2:  [93 %-97 %] 93 % (06/19 0502) Weight:  [223 lb (101.152 kg)] 223 lb (101.152 kg) (06/19 0735) Last BM Date: 02/08/14  Intake/Output from previous day: 06/18 0701 - 06/19 0700 In: -  Out: 700 [Urine:700] Intake/Output this shift:    PE: Abd: soft, mildly tender in RUQ, +BS  Lab Results:   Recent Labs  02/08/14 1840 02/09/14 0443  WBC 6.5 6.5  HGB 13.5 12.7  HCT 39.3 38.0  PLT 254 228   BMET  Recent Labs  02/08/14 1840 02/09/14 0443  NA 139 139  K 3.3* 3.7  CL 97 98  CO2 26 31  GLUCOSE 86 135*  BUN 6 5*  CREATININE 0.80 0.90  CALCIUM 9.8 8.9   PT/INR No results found for this basename: LABPROT, INR,  in the last 72 hours CMP     Component Value Date/Time   NA 139 02/09/2014 0443   K 3.7 02/09/2014 0443   CL 98 02/09/2014 0443   CO2 31 02/09/2014 0443   GLUCOSE 135* 02/09/2014 0443   BUN 5* 02/09/2014 0443   CREATININE 0.90 02/09/2014 0443   CALCIUM 8.9 02/09/2014 0443   PROT 7.0 02/09/2014 0443   ALBUMIN 3.7 02/09/2014 0443   AST 22 02/09/2014 0443   ALT 21 02/09/2014 0443   ALKPHOS 61 02/09/2014 0443   BILITOT 0.3 02/09/2014 0443   GFRNONAA 74* 02/09/2014 0443   GFRAA 86* 02/09/2014 0443   Lipase     Component Value Date/Time   LIPASE 20 02/08/2014 1840       Studies/Results: No results found.  Anti-infectives: Anti-infectives   Start     Dose/Rate Route Frequency Ordered Stop   02/08/14 2315  ciprofloxacin (CIPRO) IVPB 400 mg     400 mg 200 mL/hr over 60 Minutes Intravenous Every 12 hours 02/08/14 2313         Assessment/Plan  1. Cholelithiasis 2. Abdominal pain  Plan: 1.  Will try to proceed with lap chole today, but possibly won't be until tomorrow.  D/w the patient.  She understands.   LOS: 1 day    Kha Hari E 02/09/2014, 11:05 AM Pager: [phone]

## 2014-02-09 NOTE — Op Note (Signed)
Amanda [name] @date @  Procedure: Laparoscopic Cholecystectomy with intraoperative cholangiogram  Surgeon: Wenda LowMatt Quindell Shere, MD, FACS Asst:  none  Anes:  General  Drains: None  Findings: Intrahepatic gallbladder; plump CBD on cholangiogram but with no filling defects and free flow into the duodenum; small BB sized stones  Description of Procedure: The patient was taken to OR 1 and given general anesthesia.  The patient was prepped with PCMX and draped sterilely. A time out was performed.  Access to the abdomen was achieved with a 5 mm Optiview through the right upper quadrant.  Port placement included two 11 mm and two 5 mm ports.    The gallbladder was visualized and the fundus was grasped and the gallbladder was elevated. Traction on the infundibulum allowed for successful demonstration of the critical view. Inflammatory changes were minimal.  The cystic duct was identified and clipped up on the gallbladder and an incision was made in the cystic duct and the Reddick catheter was inserted after milking the cystic duct of any debris. A dynamic cholangiogram was performed which demonstrated a plump CBD but no stones in the duct.    The cystic duct was then triple clipped and divided, the cystic artery was double clipped and divided and then the gallbladder was removed from the gallbladder bed. Removal of the gallbladder from the gallbladder bed was accomplished without spillage.  The gallbladder was then placed in a bag and brought out through one of the 10 mm trocar sites. The gallbladder bed was inspected and no bleeding or bile leaks were seen.  Trocars were all placed obliquely.   Incisions were injected with Exparel and closed with 4-0 Vicryl and Dermabond on the skin.  Sponge and needle count were correct.    The patient was taken to the recovery room in satisfactory condition.

## 2014-02-09 NOTE — Progress Notes (Signed)
Call received form lab re positive mrsa and staph result. Pt placed on contact and protocol implemented. Pt asleep at this time; therefore education not provided. Vwilliams,rn.

## 2014-02-09 NOTE — Interval H&P Note (Signed)
History and Physical Interval Note:  02/09/2014 2:55 PM  Amanda [name]  has presented today for surgery, with the diagnosis of CHOLELITHIASIS  The various methods of treatment have been discussed with the patient and family. After consideration of risks, benefits and other options for treatment, the patient has consented to  Procedure(s): LAPAROSCOPIC CHOLECYSTECTOMY WITH INTRAOPERATIVE CHOLANGIOGRAM (N/A) as a surgical intervention .  The patient's history has been reviewed, patient examined, no change in status, stable for surgery.  I have reviewed the patient's chart and labs.  Questions were answered to the patient's satisfaction.     [name] B

## 2014-02-09 NOTE — Progress Notes (Signed)
INITIAL NUTRITION ASSESSMENT  DOCUMENTATION CODES Per approved criteria  -Obesity grade 1   INTERVENTION:  NPO for surgery  RD to follow  NUTRITION DIAGNOSIS: Inadequate oral intake related to altered gi function as evidenced by npo status.   Goal: Meet >90% estimated needs.  Monitor:  Diet advancement and tolerance, labs, weight trend.  Reason for Assessment: MST  48 y.o. female  Admitting Dx: <principal problem not specified>  ASSESSMENT: Patient admitted with gall bladder disease for surgery.    Patient states that she is hungry.  Poor intake for the pat 2-3 weeks secondary to abdominal pain.  Reports UBW 226 lbs and "I've lost 17 lbs in the past 2-3 weeks."  Per e-chart, weight is stable.  Height: Ht Readings from Last 1 Encounters:  02/09/14 5\' 8"  (1.727 m)    Weight: Wt Readings from Last 1 Encounters:  02/09/14 223 lb (101.152 kg)    Ideal Body Weight: 140 lbs  % Ideal Body Weight: 159  Wt Readings from Last 10 Encounters:  02/09/14 223 lb (101.152 kg)  02/09/14 223 lb (101.152 kg)  07/25/13 223 lb (101.152 kg)  05/25/13 215 lb (97.523 kg)  02/23/13 214 lb 12.8 oz (97.433 kg)  02/17/13 207 lb (93.895 kg)  11/18/12 207 lb 8 oz (94.121 kg)    Usual Body Weight: 223 lbs  % Usual Body Weight: 100  BMI:  Body mass index is 33.91 kg/(m^2).  Estimated Nutritional Needs: Kcal: 1600-1700 Protein: 90-100 gm Fluid: >1.6L  Skin: intact  Diet Order: NPO  EDUCATION NEEDS: -No education needs identified at this time   Intake/Output Summary (Last 24 hours) at 02/09/14 0909 Last data filed at 02/09/14 0502  Gross per 24 hour  Intake      0 ml  Output    700 ml  Net   -700 ml    Last BM: unknown  Labs:   Recent Labs Lab 02/08/14 1840 02/09/14 0443  NA 139 139  K 3.3* 3.7  CL 97 98  CO2 26 31  BUN 6 5*  CREATININE 0.80 0.90  CALCIUM 9.8 8.9  GLUCOSE 86 135*    CBG (last 3)  No results found for this basename: GLUCAP,  in the  last 72 hours  Scheduled Meds: . Chlorhexidine Gluconate Cloth  6 each Topical Q0600  . ciprofloxacin  400 mg Intravenous Q12H  . mupirocin ointment  1 application Nasal BID  . pantoprazole (PROTONIX) IV  40 mg Intravenous Q12H    Continuous Infusions: . dextrose 5 % and 0.45 % NaCl with KCl 20 mEq/L 125 mL/hr at 02/09/14 0020    Past Medical History  Diagnosis Date  . Depression   . Anxiety   . Carpal tunnel syndrome, bilateral   . GERD (gastroesophageal reflux disease)   . Neuropathy     Past Surgical History  Procedure Laterality Date  . Rotator cuff repair Right 2011  . Hammer toe fusion Bilateral 2008  . Carpal tunnel release Right 2003    Oran ReinLaura Jobe, RD, LDN Clinical Inpatient Dietitian Pager:  [phone] Weekend and after hours pager:  [phone]

## 2014-02-09 NOTE — Anesthesia Postprocedure Evaluation (Signed)
  Anesthesia Post-op Note  Patient: Amanda [name]  Procedure(s) Performed: Procedure(s) (LRB): LAPAROSCOPIC CHOLECYSTECTOMY WITH INTRAOPERATIVE CHOLANGIOGRAM (N/A)  Patient Location: PACU  Anesthesia Type: General  Level of Consciousness: awake and alert   Airway and Oxygen Therapy: Patient Spontanous Breathing  Post-op Pain: mild  Post-op Assessment: Post-op Vital signs reviewed, Patient's Cardiovascular Status Stable, Respiratory Function Stable, Patent Airway and No signs of Nausea or vomiting  Last Vitals:  Filed Vitals:   02/09/14 1334  BP: 113/70  Pulse: 62  Temp: 36.6 C  Resp: 15    Post-op Vital Signs: stable   Complications: No apparent anesthesia complications

## 2014-02-09 NOTE — Transfer of Care (Signed)
Immediate Anesthesia Transfer of Care Note  Patient: Amanda [name]  Procedure(s) Performed: Procedure(s): LAPAROSCOPIC CHOLECYSTECTOMY WITH INTRAOPERATIVE CHOLANGIOGRAM (N/A)  Patient Location: PACU  Anesthesia Type:General  Level of Consciousness: awake, alert  and oriented  Airway & Oxygen Therapy: Patient Spontanous Breathing and Patient connected to face mask oxygen  Post-op Assessment: Report given to PACU RN  Post vital signs: Reviewed and stable  Complications: No apparent anesthesia complications

## 2014-02-10 DIAGNOSIS — K811 Chronic cholecystitis: Secondary | ICD-10-CM

## 2014-02-10 DIAGNOSIS — K824 Cholesterolosis of gallbladder: Secondary | ICD-10-CM

## 2014-02-10 LAB — COMPREHENSIVE METABOLIC PANEL
ALT: 62 U/L — ABNORMAL HIGH (ref 0–35)
AST: 70 U/L — ABNORMAL HIGH (ref 0–37)
Albumin: 3.3 g/dL — ABNORMAL LOW (ref 3.5–5.2)
Alkaline Phosphatase: 61 U/L (ref 39–117)
BUN: 6 mg/dL (ref 6–23)
CO2: 27 mEq/L (ref 19–32)
Calcium: 8.6 mg/dL (ref 8.4–10.5)
Chloride: 105 mEq/L (ref 96–112)
Creatinine, Ser: 0.86 mg/dL (ref 0.50–1.10)
GFR calc Af Amer: >90 mL/min (ref >90)
GFR calc non Af Amer: 79 mL/min — ABNORMAL LOW (ref >90)
Glucose, Bld: 110 mg/dL — ABNORMAL HIGH (ref 70–99)
Potassium: 3.4 mEq/L — ABNORMAL LOW (ref 3.7–5.3)
Sodium: 143 mEq/L (ref 137–147)
Total Bilirubin: <0.2 mg/dL — ABNORMAL LOW (ref 0.3–1.2)
Total Protein: 5.9 g/dL — ABNORMAL LOW (ref 6.0–8.3)

## 2014-02-10 LAB — CBC
HCT: 35 % — ABNORMAL LOW (ref 36.0–46.0)
Hemoglobin: 11.6 g/dL — ABNORMAL LOW (ref 12.0–15.0)
MCH: 32.5 pg (ref 26.0–34.0)
MCHC: 33.1 g/dL (ref 30.0–36.0)
MCV: 98 fL (ref 78.0–100.0)
Platelets: 210 10*3/uL (ref 150–400)
RBC: 3.57 MIL/uL — ABNORMAL LOW (ref 3.87–5.11)
RDW: 12.4 % (ref 11.5–15.5)
WBC: 7.2 10*3/uL (ref 4.0–10.5)

## 2014-02-10 MED ORDER — CLONIDINE HCL 0.1 MG PO TABS
0.1000 mg | ORAL_TABLET | Freq: Three times a day (TID) | ORAL | Status: DC
  Administered 2014-02-10 – 2014-02-11 (×5): 0.1 mg via ORAL
  Filled 2014-02-10 (×9): qty 1

## 2014-02-10 MED ORDER — ALPRAZOLAM 0.25 MG PO TABS
0.2500 mg | ORAL_TABLET | Freq: Two times a day (BID) | ORAL | Status: DC | PRN
  Administered 2014-02-10 – 2014-02-12 (×5): 0.25 mg via ORAL
  Filled 2014-02-10 (×5): qty 1

## 2014-02-10 MED ORDER — HYDROCHLOROTHIAZIDE 25 MG PO TABS
25.0000 mg | ORAL_TABLET | Freq: Every day | ORAL | Status: DC
  Administered 2014-02-10 – 2014-02-11 (×2): 25 mg via ORAL
  Filled 2014-02-10 (×3): qty 1

## 2014-02-10 MED ORDER — MIRTAZAPINE 30 MG PO TABS
30.0000 mg | ORAL_TABLET | Freq: Every day | ORAL | Status: DC
  Administered 2014-02-10 – 2014-02-11 (×2): 30 mg via ORAL
  Filled 2014-02-10 (×3): qty 1

## 2014-02-10 MED ORDER — ATENOLOL 50 MG PO TABS
50.0000 mg | ORAL_TABLET | Freq: Every day | ORAL | Status: DC
  Administered 2014-02-10 – 2014-02-11 (×2): 50 mg via ORAL
  Filled 2014-02-10 (×3): qty 1

## 2014-02-10 MED ORDER — GABAPENTIN 100 MG PO CAPS
100.0000 mg | ORAL_CAPSULE | Freq: Two times a day (BID) | ORAL | Status: DC
  Administered 2014-02-10 – 2014-02-11 (×4): 100 mg via ORAL
  Filled 2014-02-10 (×6): qty 1

## 2014-02-10 MED ORDER — MORPHINE SULFATE 2 MG/ML IJ SOLN
2.0000 mg | INTRAMUSCULAR | Status: DC | PRN
  Administered 2014-02-10 – 2014-02-12 (×15): 2 mg via INTRAVENOUS
  Filled 2014-02-10 (×15): qty 1

## 2014-02-10 MED ORDER — TRAZODONE HCL 150 MG PO TABS
300.0000 mg | ORAL_TABLET | Freq: Every day | ORAL | Status: DC
  Administered 2014-02-10 – 2014-02-11 (×2): 300 mg via ORAL
  Filled 2014-02-10 (×3): qty 2

## 2014-02-10 MED ORDER — OXYCODONE HCL ER 20 MG PO T12A
20.0000 mg | EXTENDED_RELEASE_TABLET | Freq: Two times a day (BID) | ORAL | Status: DC
  Administered 2014-02-10 – 2014-02-11 (×4): 20 mg via ORAL
  Filled 2014-02-10 (×4): qty 1

## 2014-02-10 MED ORDER — PANTOPRAZOLE SODIUM 40 MG PO TBEC
40.0000 mg | DELAYED_RELEASE_TABLET | Freq: Every day | ORAL | Status: DC
  Administered 2014-02-10 – 2014-02-11 (×2): 40 mg via ORAL
  Filled 2014-02-10 (×3): qty 1

## 2014-02-10 MED ORDER — HYDROXYZINE HCL 50 MG PO TABS
50.0000 mg | ORAL_TABLET | Freq: Two times a day (BID) | ORAL | Status: DC | PRN
  Filled 2014-02-10 (×2): qty 1

## 2014-02-10 MED ORDER — ALPRAZOLAM 0.25 MG PO TABS: 0.2500 mg | ORAL_TABLET | Freq: Every evening | ORAL | Status: DC | PRN

## 2014-02-10 MED ORDER — SUCRALFATE 1 G PO TABS
1.0000 g | ORAL_TABLET | Freq: Three times a day (TID) | ORAL | Status: DC
  Administered 2014-02-10 – 2014-02-11 (×7): 1 g via ORAL
  Filled 2014-02-10 (×12): qty 1

## 2014-02-10 NOTE — Progress Notes (Signed)
Patient ID: Amanda [name], female   DOB: [birth year], 48 y.o.   MRN: [id number] [facility] Progress Note:   1 Day Post-Op  Subjective: Mental status is clear.  Up walking and complaining of pain.  She requested her anxiety meds. Objective: Vital signs in last 24 hours: Temp:  [97.1 F (36.2 C)-98.6 F (37 C)] 98.6 F (37 C) (06/20 0522) Pulse Rate:  [37-90] 37 (06/20 0522) Resp:  [14-28] 16 (06/20 0522) BP: (100-141)/(56-88) 106/68 mmHg (06/20 0522) SpO2:  [92 %-100 %] 96 % (06/20 0522)  Intake/Output from previous day: 06/19 0701 - 06/20 0700 In: 2306.7 [P.O.:120; I.V.:2186.7] Out: 500 [Urine:500] Intake/Output this shift:    Physical Exam: Work of breathing is not labored.  Up walking.   Lab Results:  Results for orders placed during the hospital encounter of 02/08/14 (from the past 48 hour(s))  CBC WITH DIFFERENTIAL     Status: Abnormal   Collection Time    02/08/14  6:40 PM      Result Value Ref Range   WBC 6.5  4.0 - 10.5 K/uL   RBC 4.09  3.87 - 5.11 MIL/uL   Hemoglobin 13.5  12.0 - 15.0 g/dL   HCT 39.3  36.0 - 46.0 %   MCV 96.1  78.0 - 100.0 fL   MCH 33.0  26.0 - 34.0 pg   MCHC 34.4  30.0 - 36.0 g/dL   RDW 12.5  11.5 - 15.5 %   Platelets 254  150 - 400 K/uL   Neutrophils Relative % 32 (*) 43 - 77 %   Neutro Abs 2.1  1.7 - 7.7 K/uL   Lymphocytes Relative 59 (*) 12 - 46 %   Lymphs Abs 3.8  0.7 - 4.0 K/uL   Monocytes Relative 7  3 - 12 %   Monocytes Absolute 0.5  0.1 - 1.0 K/uL   Eosinophils Relative 2  0 - 5 %   Eosinophils Absolute 0.1  0.0 - 0.7 K/uL   Basophils Relative 0  0 - 1 %   Basophils Absolute 0.0  0.0 - 0.1 K/uL  COMPREHENSIVE METABOLIC PANEL     Status: Abnormal   Collection Time    02/08/14  6:40 PM      Result Value Ref Range   Sodium 139  137 - 147 mEq/L   Potassium 3.3 (*) 3.7 - 5.3 mEq/L   Chloride 97  96 - 112 mEq/L   CO2 26  19 - 32 mEq/L   Glucose, Bld 86  70 - 99 mg/dL   BUN 6  6 - 23 mg/dL   Creatinine, Ser 0.80  0.50  - 1.10 mg/dL   Calcium 9.8  8.4 - 10.5 mg/dL   Total Protein 8.1  6.0 - 8.3 g/dL   Albumin 4.5  3.5 - 5.2 g/dL   AST 28  0 - 37 U/L   ALT 26  0 - 35 U/L   Alkaline Phosphatase 67  39 - 117 U/L   Total Bilirubin 0.3  0.3 - 1.2 mg/dL   GFR calc non Af Amer 86 (*) >90 mL/min   GFR calc Af Amer >90  >90 mL/min   Comment: (NOTE)     The eGFR has been calculated using the CKD EPI equation.     This calculation has not been validated in all clinical situations.     eGFR's persistently <90 mL/min signify possible Chronic Kidney     Disease.  LIPASE, BLOOD  Status: None   Collection Time    02/08/14  6:40 PM      Result Value Ref Range   Lipase 20  11 - 59 U/L  TROPONIN I     Status: None   Collection Time    02/08/14  6:40 PM      Result Value Ref Range   Troponin I <0.30  <0.30 ng/mL   Comment:            Due to the release kinetics of cTnI,     a negative result within the first hours     of the onset of symptoms does not rule out     myocardial infarction with certainty.     If myocardial infarction is still suspected,     repeat the test at appropriate intervals.  SURGICAL PCR SCREEN     Status: Abnormal   Collection Time    02/09/14  1:30 AM      Result Value Ref Range   MRSA, PCR POSITIVE (*) NEGATIVE   Comment: RESULT CALLED TO, READ BACK BY AND VERIFIED WITH:     SPOKE WITH WILLIAMS,V RN [phone] COVINGTON,N   Staphylococcus aureus POSITIVE (*) NEGATIVE   Comment:            The Xpert SA Assay (FDA     approved for NASAL specimens     in patients over 68 years of age),     is one component of     a comprehensive surveillance     program.  Test performance has     been validated by [employer] for patients greater     than or equal to 72 year old.     It is not intended     to diagnose infection nor to     guide or monitor treatment.     RESULT CALLED TO, READ BACK BY AND VERIFIED WITH:     SPOKE WITH WILLIAMS,V RN [phone] COVINGTON,N  CBC      Status: None   Collection Time    02/09/14  4:43 AM      Result Value Ref Range   WBC 6.5  4.0 - 10.5 K/uL   RBC 3.91  3.87 - 5.11 MIL/uL   Hemoglobin 12.7  12.0 - 15.0 g/dL   HCT 38.0  36.0 - 46.0 %   MCV 97.2  78.0 - 100.0 fL   MCH 32.5  26.0 - 34.0 pg   MCHC 33.4  30.0 - 36.0 g/dL   RDW 12.6  11.5 - 15.5 %   Platelets 228  150 - 400 K/uL  COMPREHENSIVE METABOLIC PANEL     Status: Abnormal   Collection Time    02/09/14  4:43 AM      Result Value Ref Range   Sodium 139  137 - 147 mEq/L   Potassium 3.7  3.7 - 5.3 mEq/L   Chloride 98  96 - 112 mEq/L   CO2 31  19 - 32 mEq/L   Glucose, Bld 135 (*) 70 - 99 mg/dL   BUN 5 (*) 6 - 23 mg/dL   Creatinine, Ser 0.90  0.50 - 1.10 mg/dL   Calcium 8.9  8.4 - 10.5 mg/dL   Total Protein 7.0  6.0 - 8.3 g/dL   Albumin 3.7  3.5 - 5.2 g/dL   AST 22  0 - 37 U/L   ALT 21  0 - 35 U/L   Alkaline  Phosphatase 61  39 - 117 U/L   Total Bilirubin 0.3  0.3 - 1.2 mg/dL   GFR calc non Af Amer 74 (*) >90 mL/min   GFR calc Af Amer 86 (*) >90 mL/min   Comment: (NOTE)     The eGFR has been calculated using the CKD EPI equation.     This calculation has not been validated in all clinical situations.     eGFR's persistently <90 mL/min signify possible Chronic Kidney     Disease.  CBC     Status: Abnormal   Collection Time    02/10/14  4:30 AM      Result Value Ref Range   WBC 7.2  4.0 - 10.5 K/uL   RBC 3.57 (*) 3.87 - 5.11 MIL/uL   Hemoglobin 11.6 (*) 12.0 - 15.0 g/dL   HCT 35.0 (*) 36.0 - 46.0 %   MCV 98.0  78.0 - 100.0 fL   MCH 32.5  26.0 - 34.0 pg   MCHC 33.1  30.0 - 36.0 g/dL   RDW 12.4  11.5 - 15.5 %   Platelets 210  150 - 400 K/uL  COMPREHENSIVE METABOLIC PANEL     Status: Abnormal   Collection Time    02/10/14  4:30 AM      Result Value Ref Range   Sodium 143  137 - 147 mEq/L   Potassium 3.4 (*) 3.7 - 5.3 mEq/L   Chloride 105  96 - 112 mEq/L   CO2 27  19 - 32 mEq/L   Glucose, Bld 110 (*) 70 - 99 mg/dL   BUN 6  6 - 23 mg/dL   Creatinine,  Ser 0.86  0.50 - 1.10 mg/dL   Calcium 8.6  8.4 - 10.5 mg/dL   Total Protein 5.9 (*) 6.0 - 8.3 g/dL   Albumin 3.3 (*) 3.5 - 5.2 g/dL   AST 70 (*) 0 - 37 U/L   ALT 62 (*) 0 - 35 U/L   Alkaline Phosphatase 61  39 - 117 U/L   Total Bilirubin <0.2 (*) 0.3 - 1.2 mg/dL   GFR calc non Af Amer 79 (*) >90 mL/min   GFR calc Af Amer >90  >90 mL/min   Comment: (NOTE)     The eGFR has been calculated using the CKD EPI equation.     This calculation has not been validated in all clinical situations.     eGFR's persistently <90 mL/min signify possible Chronic Kidney     Disease.    Radiology/Results: Dg Cholangiogram Operative  02/09/2014   CLINICAL DATA:  History of gallstones, evaluate for CBD stones  EXAM: INTRAOPERATIVE CHOLANGIOGRAM  FLUOROSCOPY TIME:  14 seconds  COMPARISON:  Abdominal ultrasound - 02/07/2014; CT the abdomen pelvis - 01/22/2014  FINDINGS: Intraoperative angiographic images of the right upper abdominal quadrant during laparoscopic cholecystectomy are provided for review.  Surgical clips overlie the expected location of the gallbladder fossa.  Contrast injection demonstrates selective cannulation of the central aspect of the cystic duct.  There is passage of contrast through the central aspect of the cystic duct with filling of a non dilated common bile duct. There is passage of contrast though the CBD and into the descending portion of the duodenum.  There is minimal reflux of injected contrast into the common hepatic duct and central aspect of the non dilated intrahepatic biliary system.  There are no discrete filling defects within the opacified portions of the biliary system to suggest the presence of choledocholithiasis.  IMPRESSION:  No evidence of choledocholithiasis.   Electronically Signed   By: Sandi Mariscal M.D.   On: 02/09/2014 17:04    Anti-infectives: Anti-infectives   Start     Dose/Rate Route Frequency Ordered Stop   02/08/14 2315  ciprofloxacin (CIPRO) IVPB 400 mg  Status:   Discontinued     400 mg 200 mL/hr over 60 Minutes Intravenous Every 12 hours 02/08/14 2313 02/09/14 1923      Assessment/Plan: Problem List: Patient Active Problem List   Diagnosis Date Noted  . S/P laparoscopic cholecystectomyJune2015 02/09/2014  . HTN (hypertension) 07/25/2013  . Pain in vertebral column 05/05/2013  . Carpal tunnel syndrome 11/18/2012  . Numbness and tingling of both legs below knees 11/18/2012  . Lumbago 11/18/2012  . Anxiety state, unspecified 11/18/2012    Will advance diet.  Observe.  Not ready for discharge 1 Day Post-Op    LOS: 2 days   Matt B. Hassell Done, MD, [facility] [phone] beeper [phone]  02/10/2014 9:14 AM

## 2014-02-10 NOTE — Progress Notes (Signed)
Pt c/o chest pain, she reported it as her heart "thumping".  Notified MD that vital signs were stable, EKG was normal except for a few PVC's.  Pt's pain med was increased. No other orders given. Will continue to monitor.

## 2014-02-11 NOTE — Progress Notes (Signed)
Patient ID: Amanda [name], female   DOB: June 28, 1966, 48 y.o.   MRN: [id number] [facility] Progress Note:   2 Days Post-Op  Subjective: Mental status is clear.  She is complaining of pain in her side that seems incisional. Objective: Vital signs in last 24 hours: Temp:  [98.1 F (36.7 C)-98.9 F (37.2 C)] 98.1 F (36.7 C) (06/21 0910) Pulse Rate:  [62-89] 89 (06/21 0910) Resp:  [16-18] 18 (06/21 0910) BP: (120-150)/(83-100) 150/100 mmHg (06/21 0910) SpO2:  [100 %] 100 % (06/21 0910)  Intake/Output from previous day: 06/20 0701 - 06/21 0700 In: 3600 [P.O.:1200; I.V.:2400] Out: 4850 [Urine:4850] Intake/Output this shift:    Physical Exam: Work of breathing is normal.  Abdomen is appropriately sore  Lab Results:  Results for orders placed during the hospital encounter of 02/08/14 (from the past 48 hour(s))  CBC     Status: Abnormal   Collection Time    02/10/14  4:30 AM      Result Value Ref Range   WBC 7.2  4.0 - 10.5 K/uL   RBC 3.57 (*) 3.87 - 5.11 MIL/uL   Hemoglobin 11.6 (*) 12.0 - 15.0 g/dL   HCT 35.0 (*) 36.0 - 46.0 %   MCV 98.0  78.0 - 100.0 fL   MCH 32.5  26.0 - 34.0 pg   MCHC 33.1  30.0 - 36.0 g/dL   RDW 12.4  11.5 - 15.5 %   Platelets 210  150 - 400 K/uL  COMPREHENSIVE METABOLIC PANEL     Status: Abnormal   Collection Time    02/10/14  4:30 AM      Result Value Ref Range   Sodium 143  137 - 147 mEq/L   Potassium 3.4 (*) 3.7 - 5.3 mEq/L   Chloride 105  96 - 112 mEq/L   CO2 27  19 - 32 mEq/L   Glucose, Bld 110 (*) 70 - 99 mg/dL   BUN 6  6 - 23 mg/dL   Creatinine, Ser 0.86  0.50 - 1.10 mg/dL   Calcium 8.6  8.4 - 10.5 mg/dL   Total Protein 5.9 (*) 6.0 - 8.3 g/dL   Albumin 3.3 (*) 3.5 - 5.2 g/dL   AST 70 (*) 0 - 37 U/L   ALT 62 (*) 0 - 35 U/L   Alkaline Phosphatase 61  39 - 117 U/L   Total Bilirubin <0.2 (*) 0.3 - 1.2 mg/dL   GFR calc non Af Amer 79 (*) >90 mL/min   GFR calc Af Amer >90  >90 mL/min   Comment: (NOTE)     The eGFR has been  calculated using the CKD EPI equation.     This calculation has not been validated in all clinical situations.     eGFR's persistently <90 mL/min signify possible Chronic Kidney     Disease.    Radiology/Results: Dg Cholangiogram Operative  02/09/2014   CLINICAL DATA:  History of gallstones, evaluate for CBD stones  EXAM: INTRAOPERATIVE CHOLANGIOGRAM  FLUOROSCOPY TIME:  14 seconds  COMPARISON:  Abdominal ultrasound - 02/07/2014; CT the abdomen pelvis - 01/22/2014  FINDINGS: Intraoperative angiographic images of the right upper abdominal quadrant during laparoscopic cholecystectomy are provided for review.  Surgical clips overlie the expected location of the gallbladder fossa.  Contrast injection demonstrates selective cannulation of the central aspect of the cystic duct.  There is passage of contrast through the central aspect of the cystic duct with filling of a non dilated common bile duct. There is  passage of contrast though the CBD and into the descending portion of the duodenum.  There is minimal reflux of injected contrast into the common hepatic duct and central aspect of the non dilated intrahepatic biliary system.  There are no discrete filling defects within the opacified portions of the biliary system to suggest the presence of choledocholithiasis.  IMPRESSION: No evidence of choledocholithiasis.   Electronically Signed   By: Sandi Mariscal M.D.   On: 02/09/2014 17:04    Anti-infectives: Anti-infectives   Start     Dose/Rate Route Frequency Ordered Stop   02/08/14 2315  ciprofloxacin (CIPRO) IVPB 400 mg  Status:  Discontinued     400 mg 200 mL/hr over 60 Minutes Intravenous Every 12 hours 02/08/14 2313 02/09/14 1923      Assessment/Plan: Problem List: Patient Active Problem List   Diagnosis Date Noted  . S/P laparoscopic cholecystectomyJune2015 02/09/2014  . HTN (hypertension) 07/25/2013  . Pain in vertebral column 05/05/2013  . Carpal tunnel syndrome 11/18/2012  . Numbness and  tingling of both legs below knees 11/18/2012  . Lumbago 11/18/2012  . Anxiety state, unspecified 11/18/2012    There are underlying anxiety issues.  Home meds restarted.  She is not ready for discharge today.  Hopeful discharge tomorrow.   2 Days Post-Op    LOS: 3 days   Matt B. Hassell Done, MD, [facility] [phone] beeper [phone]  02/11/2014 10:41 AM

## 2014-02-12 ENCOUNTER — Encounter (HOSPITAL_COMMUNITY): Payer: Self-pay | Admitting: Surgery

## 2014-02-12 ENCOUNTER — Ambulatory Visit (HOSPITAL_COMMUNITY): Payer: BC Managed Care – PPO

## 2014-02-12 MED ORDER — HYDROCODONE-ACETAMINOPHEN 5-325 MG PO TABS: 1.0000 | ORAL_TABLET | Freq: Four times a day (QID) | ORAL | Status: DC | PRN

## 2014-02-12 NOTE — Discharge Instructions (Signed)
See above

## 2014-02-12 NOTE — Progress Notes (Signed)
Patient ID: Amanda [name] [id number] 48 y.o. May 08, 1966  Admit date: 02/08/2014  Discharge date and time: No discharge date for patient encounter.  Admitting Physician: Amanda MurphyMatthew Martin, MD  Discharge Physician: Amanda [name]  Admission Diagnoses: Symptomatic cholelithiasis [574.20]  Discharge Diagnoses: Chronic cholecystitis with cholelithiasis Hypertension Obesity Numbness and tingling in both legs, on gabapentin Anxiety state, unspecified, on Xanax  Operations: Procedure(s): LAPAROSCOPIC CHOLECYSTECTOMY WITH INTRAOPERATIVE CHOLANGIOGRAM  Admission Condition: fair  Discharged Condition: good  Indication for Admission: This patient presented to the emergency department for abdominal pain. She states she has had problems for over a year he also complained of some abdominal bloating, trouble eating and recurrent progression of symptoms. CT scan of the abdomen on 01/22/2049 was negative. She has been evaluated by Amanda [name] and reportedly had upper endoscopy  about 2 weeks Prior, states her esophagus was stretched, but there are no notes in appetite. Long history of irritable bowel syndrome.Abdominal ultrasound shows gallstones but no evidence of gallbladder wall thickening. Borderline dilated common bile duct.Liver function tests and lipase were normal.Symptoms were complex but she did have gallstones and she does have right upper quadrant pain associated is admitted for management.  Hospital Course: On June 19 she was taken to the operating room and underwent laparoscopic cholecystectomy with cholangiogram. Cholangiogram showed borderline dilated CBD but no stones in the duct.Postoperatively the patient progressed slowly without complication. She had incisional pain. She had some anxiety issues, thought to be chronic,  but basically did well. On the day of discharge she stated that she wanted to go home. She states she was tolerating a regular diet. Voiding uneventfully and ambulating  without difficulty. Abdominal exam revealed obesity. Soft abdomen without distention, mild epigastric incisional tenderness. Diet activities were discussed. She was given a prescription for Norco. She was asked to follow up in the office with Amanda [name] in approximately 2 weeks.  Consults: None  Significant Diagnostic Studies: Abdominal ultrasound. Intraoperative cholangiogram. Surgical pathology, pending.  Treatments: surgery: Laparoscopic cholecystectomy with cholangiogram  Disposition: Home  Patient Instructions:    Medication List         ALPRAZolam 0.25 MG tablet  Commonly known as:  XANAX  Take 1 tablet by mouth twice a day as needed for anxiety.     atenolol 50 MG tablet  Commonly known as:  TENORMIN  Take 50 mg by mouth daily.     cloNIDine 0.1 MG tablet  Commonly known as:  CATAPRES  Take 1 tablet (0.1 mg total) by mouth 3 (three) times daily.     gabapentin 300 MG capsule  Commonly known as:  NEURONTIN  Take 1 capsule in the morning and 2 capsules at bedtime.     hydrochlorothiazide 25 MG tablet  Commonly known as:  HYDRODIURIL  Take 1 tablet (25 mg total) by mouth daily.     HYDROcodone-acetaminophen 5-325 MG per tablet  Commonly known as:  NORCO  Take 1-2 tablets by mouth every 6 (six) hours as needed.     hydrOXYzine 50 MG capsule  Commonly known as:  VISTARIL  Take 50 mg by mouth 2 (two) times daily as needed for anxiety.     mirtazapine 30 MG tablet  Commonly known as:  REMERON  Take 30 mg by mouth at bedtime.     omeprazole 20 MG capsule  Commonly known as:  PRILOSEC  Take 20 mg by mouth daily.     OXYCONTIN 20 mg T12a 12 hr tablet  Generic drug:  OxyCODONE  Take  20 mg by mouth every 12 (twelve) hours.     sucralfate 1 G tablet  Commonly known as:  CARAFATE  Take 1 g by mouth 4 (four) times daily -  with meals and at bedtime.     trazodone 300 MG tablet  Commonly known as:  DESYREL  Take 300 mg by mouth at bedtime.        Activity:  activity as tolerated Diet: low fat, low cholesterol diet Wound Care: none needed  Follow-up:  With Amanda [name] in 2 weeks.  Signed: Angelia MouldHaywood [name]. Derrell [name], [name].D., FACS General and minimally invasive surgery Breast and Colorectal Surgery  02/12/2014, 6:30 AM

## 2014-02-12 NOTE — Progress Notes (Signed)
Discharge instructions reviewed with patient and significant other, patient is to follow up with MD from [facility] within 2 weeks, incisions are within normal limits, patient tolerating her diet without complaints of nausea or vomiting,questions and concerns answered, will continue to monitor Stanford BreedBracey, Reichen Hutzler N RN 02-12-2014 8:21am

## 2014-02-14 ENCOUNTER — Ambulatory Visit (HOSPITAL_COMMUNITY): Payer: BC Managed Care – PPO

## 2014-02-16 NOTE — Discharge Summary (Signed)
Amanda [name]  [id number]  48 y.o.  [birth year]   Admit date: 02/08/2014   Discharge date and time: No discharge date for patient encounter.   Admitting Physician: Luretha MurphyMatthew Martin, MD   Discharge Physician: Ernestene MentionINGRAM,Sanela Evola M   Admission Diagnoses: Symptomatic cholelithiasis [574.20]   Discharge Diagnoses: Chronic cholecystitis with cholelithiasis  Hypertension  Obesity  Numbness and tingling in both legs, on gabapentin  Anxiety state, unspecified, on Xanax   Operations: Procedure(s):  LAPAROSCOPIC CHOLECYSTECTOMY WITH INTRAOPERATIVE CHOLANGIOGRAM   Admission Condition: fair   Discharged Condition: good   Indication for Admission: This patient presented to the emergency department for abdominal pain. She states she has had problems for over a year he also complained of some abdominal bloating, trouble eating and recurrent progression of symptoms. CT scan of the abdomen on 01/22/2049 was negative. She has been evaluated by Dr. Loreta AveMann and reportedly had upper endoscopy about 2 weeks Prior, states her esophagus was stretched, but there are no notes in appetite. Long history of irritable bowel syndrome.Abdominal ultrasound shows gallstones but no evidence of gallbladder wall thickening. Borderline dilated common bile duct.Liver function tests and lipase were normal.Symptoms were complex but she did have gallstones and she does have right upper quadrant pain associated is admitted for management.   Hospital Course: On June 19 she was taken to the operating room and underwent laparoscopic cholecystectomy with cholangiogram. Cholangiogram showed borderline dilated CBD but no stones in the duct.Postoperatively the patient progressed slowly without complication. She had incisional pain. She had some anxiety issues, thought to be chronic, but basically did well. On the day of discharge she stated that she wanted to go home. She states she was tolerating a regular diet. Voiding uneventfully and  ambulating without difficulty. Abdominal exam revealed obesity. Soft abdomen without distention, mild epigastric incisional tenderness. Diet activities were discussed. She was given a prescription for Norco. She was asked to follow up in the office with Dr. Daphine DeutscherMartin in approximately 2 weeks.   Consults: None   Significant Diagnostic Studies: Abdominal ultrasound. Intraoperative cholangiogram. Surgical pathology, pending.   Treatments: surgery: Laparoscopic cholecystectomy with cholangiogram   Disposition: Home   Patient Instructions:    Medication List         ALPRAZolam 0.25 MG tablet    Commonly known as: XANAX    Take 1 tablet by mouth twice a day as needed for anxiety.    atenolol 50 MG tablet    Commonly known as: TENORMIN    Take 50 mg by mouth daily.    cloNIDine 0.1 MG tablet    Commonly known as: CATAPRES    Take 1 tablet (0.1 mg total) by mouth 3 (three) times daily.    gabapentin 300 MG capsule    Commonly known as: NEURONTIN    Take 1 capsule in the morning and 2 capsules at bedtime.    hydrochlorothiazide 25 MG tablet    Commonly known as: HYDRODIURIL    Take 1 tablet (25 mg total) by mouth daily.    HYDROcodone-acetaminophen 5-325 MG per tablet    Commonly known as: NORCO    Take 1-2 tablets by mouth every 6 (six) hours as needed.    hydrOXYzine 50 MG capsule    Commonly known as: VISTARIL    Take 50 mg by mouth 2 (two) times daily as needed for anxiety.    mirtazapine 30 MG tablet    Commonly known as: REMERON    Take 30 mg by mouth at bedtime.  omeprazole 20 MG capsule    Commonly known as: PRILOSEC    Take 20 mg by mouth daily.    OXYCONTIN 20 mg T12a 12 hr tablet    Generic drug: OxyCODONE    Take 20 mg by mouth every 12 (twelve) hours.    sucralfate 1 G tablet    Commonly known as: CARAFATE    Take 1 g by mouth 4 (four) times daily - with meals and at bedtime.    trazodone 300 MG tablet    Commonly known as: DESYREL    Take 300 mg by mouth at  bedtime.      Activity: activity as tolerated   Diet: low fat, low cholesterol diet   Wound Care: none needed   Follow-up: With Dr. Daphine DeutscherMartin in 2 weeks.    Signed:  Angelia MouldHaywood M. Derrell LollingIngram, M.D., FACS  General and minimally invasive surgery  Breast and Colorectal Surgery 12:12 PM 02/16/2014

## 2014-03-02 ENCOUNTER — Encounter (INDEPENDENT_AMBULATORY_CARE_PROVIDER_SITE_OTHER): Payer: BC Managed Care – PPO | Admitting: Surgery

## 2014-03-07 ENCOUNTER — Encounter (INDEPENDENT_AMBULATORY_CARE_PROVIDER_SITE_OTHER): Payer: Self-pay

## 2014-03-07 ENCOUNTER — Ambulatory Visit (INDEPENDENT_AMBULATORY_CARE_PROVIDER_SITE_OTHER): Payer: BC Managed Care – PPO | Admitting: Surgery

## 2014-03-07 ENCOUNTER — Encounter (INDEPENDENT_AMBULATORY_CARE_PROVIDER_SITE_OTHER): Payer: Self-pay | Admitting: Surgery

## 2014-03-07 VITALS — BP 140/84 | HR 71 | Temp 97.2°F | Resp 16 | Wt 204.0 lb

## 2014-03-07 DIAGNOSIS — Z9889 Other specified postprocedural states: Secondary | ICD-10-CM

## 2014-03-07 DIAGNOSIS — Z9049 Acquired absence of other specified parts of digestive tract: Secondary | ICD-10-CM

## 2014-03-07 NOTE — Progress Notes (Signed)
Amanda [name] 48 y.o.  Body mass index is 31.03 kg/(m^2).  Patient Active Problem List   Diagnosis Date Noted  . Acute calculous cholecystitis s/p lap chole 02/09/2014 02/09/2014  . HTN (hypertension) 07/25/2013  . Pain in vertebral column 05/05/2013  . Carpal tunnel syndrome 11/18/2012  . Numbness and tingling of both legs below knees 11/18/2012  . Lumbago 11/18/2012  . Anxiety state, unspecified 11/18/2012    Allergies  Allergen Reactions  . Penicillins     Throat swelling      Past Surgical History  Procedure Laterality Date  . Rotator cuff repair Right 2011  . Hammer toe fusion Bilateral 2008  . Carpal tunnel release Right 2003  . Cholecystectomy N/A 02/09/2014    Procedure: LAPAROSCOPIC CHOLECYSTECTOMY WITH INTRAOPERATIVE CHOLANGIOGRAM;  Surgeon: Valarie MerinoMatthew B Quinlynn Cuthbert, MD;  Location: [facility];  Service: General;  Laterality: N/A;   Diamantina ProvidenceANDERSON,TAKELA N., FNP No diagnosis found.  Incisions are all bland and healed.  She says that she has some muscle spasms in her abdomen from time to time.  Would not recommend pain meds for this.  Path showed chronic cholecystitis.  Return prn.  Matt B. Daphine DeutscherMartin, MD, [facility]  [facility] [phone] beeper [phone]  03/07/2014 3:07 PM

## 2014-03-21 ENCOUNTER — Emergency Department (HOSPITAL_COMMUNITY)
Admission: EM | Admit: 2014-03-21 | Discharge: 2014-03-22 | Disposition: A | Discharge status: Home / Self Care | Payer: BC Managed Care – PPO | Attending: Emergency Medicine | Admitting: Emergency Medicine

## 2014-03-21 ENCOUNTER — Encounter (HOSPITAL_COMMUNITY): Payer: Self-pay | Admitting: Emergency Medicine

## 2014-03-21 ENCOUNTER — Emergency Department (HOSPITAL_COMMUNITY): Payer: BC Managed Care – PPO

## 2014-03-21 DIAGNOSIS — Z9089 Acquired absence of other organs: Secondary | ICD-10-CM | POA: Insufficient documentation

## 2014-03-21 DIAGNOSIS — Z79899 Other long term (current) drug therapy: Secondary | ICD-10-CM | POA: Insufficient documentation

## 2014-03-21 DIAGNOSIS — G589 Mononeuropathy, unspecified: Secondary | ICD-10-CM | POA: Insufficient documentation

## 2014-03-21 DIAGNOSIS — F3289 Other specified depressive episodes: Secondary | ICD-10-CM | POA: Insufficient documentation

## 2014-03-21 DIAGNOSIS — F411 Generalized anxiety disorder: Secondary | ICD-10-CM | POA: Insufficient documentation

## 2014-03-21 DIAGNOSIS — F329 Major depressive disorder, single episode, unspecified: Secondary | ICD-10-CM | POA: Insufficient documentation

## 2014-03-21 DIAGNOSIS — R1013 Epigastric pain: Secondary | ICD-10-CM

## 2014-03-21 DIAGNOSIS — K219 Gastro-esophageal reflux disease without esophagitis: Secondary | ICD-10-CM | POA: Insufficient documentation

## 2014-03-21 DIAGNOSIS — Z880 Allergy status to penicillin: Secondary | ICD-10-CM | POA: Insufficient documentation

## 2014-03-21 LAB — LIPASE, BLOOD: Lipase: 40 U/L (ref 11–59)

## 2014-03-21 LAB — CBC WITH DIFFERENTIAL/PLATELET
Basophils Absolute: 0 10*3/uL (ref 0.0–0.1)
Basophils Relative: 0 % (ref 0–1)
Eosinophils Absolute: 0.1 10*3/uL (ref 0.0–0.7)
Eosinophils Relative: 1 % (ref 0–5)
HCT: 39.9 % (ref 36.0–46.0)
Hemoglobin: 13.8 g/dL (ref 12.0–15.0)
Lymphocytes Relative: 36 % (ref 12–46)
Lymphs Abs: 2.8 10*3/uL (ref 0.7–4.0)
MCH: 33.7 pg (ref 26.0–34.0)
MCHC: 34.6 g/dL (ref 30.0–36.0)
MCV: 97.3 fL (ref 78.0–100.0)
Monocytes Absolute: 0.9 10*3/uL (ref 0.1–1.0)
Monocytes Relative: 11 % (ref 3–12)
Neutro Abs: 3.9 10*3/uL (ref 1.7–7.7)
Neutrophils Relative %: 52 % (ref 43–77)
Platelets: 216 10*3/uL (ref 150–400)
RBC: 4.1 MIL/uL (ref 3.87–5.11)
RDW: 13.5 % (ref 11.5–15.5)
WBC: 7.6 10*3/uL (ref 4.0–10.5)

## 2014-03-21 LAB — COMPREHENSIVE METABOLIC PANEL
ALT: 24 U/L (ref 0–35)
AST: 27 U/L (ref 0–37)
Albumin: 4.1 g/dL (ref 3.5–5.2)
Alkaline Phosphatase: 85 U/L (ref 39–117)
Anion gap: 14 (ref 5–15)
BUN: 10 mg/dL (ref 6–23)
CO2: 27 mEq/L (ref 19–32)
Calcium: 9.8 mg/dL (ref 8.4–10.5)
Chloride: 98 mEq/L (ref 96–112)
Creatinine, Ser: 0.89 mg/dL (ref 0.50–1.10)
GFR calc Af Amer: 87 mL/min — ABNORMAL LOW (ref >90)
GFR calc non Af Amer: 75 mL/min — ABNORMAL LOW (ref >90)
Glucose, Bld: 112 mg/dL — ABNORMAL HIGH (ref 70–99)
Potassium: 3.2 mEq/L — ABNORMAL LOW (ref 3.7–5.3)
Sodium: 139 mEq/L (ref 137–147)
Total Bilirubin: 0.3 mg/dL (ref 0.3–1.2)
Total Protein: 7.8 g/dL (ref 6.0–8.3)

## 2014-03-21 MED ORDER — IOHEXOL 300 MG/ML  SOLN
100.0000 mL | Freq: Once | INTRAMUSCULAR | Status: AC | PRN
  Administered 2014-03-21: 100 mL via INTRAVENOUS

## 2014-03-21 MED ORDER — LORAZEPAM 2 MG/ML IJ SOLN
1.0000 mg | Freq: Once | INTRAMUSCULAR | Status: AC
  Administered 2014-03-21: 1 mg via INTRAVENOUS
  Filled 2014-03-21: qty 1

## 2014-03-21 MED ORDER — MORPHINE SULFATE 4 MG/ML IJ SOLN
6.0000 mg | Freq: Once | INTRAMUSCULAR | Status: AC
  Administered 2014-03-21: 6 mg via INTRAVENOUS
  Filled 2014-03-21: qty 2

## 2014-03-21 MED ORDER — SODIUM CHLORIDE 0.9 % IV BOLUS (SEPSIS)
1000.0000 mL | Freq: Once | INTRAVENOUS | Status: AC
  Administered 2014-03-21: 1000 mL via INTRAVENOUS

## 2014-03-21 MED ORDER — HYDROMORPHONE HCL PF 1 MG/ML IJ SOLN
1.0000 mg | Freq: Once | INTRAMUSCULAR | Status: AC
  Administered 2014-03-21: 1 mg via INTRAVENOUS
  Filled 2014-03-21: qty 1

## 2014-03-21 MED ORDER — POTASSIUM CHLORIDE CRYS ER 20 MEQ PO TBCR
40.0000 meq | EXTENDED_RELEASE_TABLET | Freq: Once | ORAL | Status: AC
  Administered 2014-03-22: 40 meq via ORAL
  Filled 2014-03-21: qty 2

## 2014-03-21 MED ORDER — ONDANSETRON HCL 4 MG/2ML IJ SOLN
4.0000 mg | Freq: Once | INTRAMUSCULAR | Status: AC
  Administered 2014-03-21: 4 mg via INTRAVENOUS
  Filled 2014-03-21: qty 2

## 2014-03-21 NOTE — ED Notes (Signed)
Pt stated that she could not give any urine.  Will try again in 30 minutes.

## 2014-03-21 NOTE — ED Provider Notes (Signed)
CSN: [id number]     Arrival date & time 03/21/14  2017 History   First MD Initiated Contact with Patient 03/21/14 2103     Chief Complaint  Patient presents with  . Abdominal Pain     (Consider location/radiation/quality/duration/timing/severity/associated sxs/prior Treatment) HPI  48yF with abdominal pain. Difficult historian. Her words seem to all slur together. She's complaining of abdominal pain. Pain is in epigastrium. She status post recent cholecystectomy. She reports that her pain has been increasing over the past week. No appreciable relieving factors. Sometimes worse after eating. No fevers or chills. Associated with nausea. Has been spitting up small amounts. No urinary complaints. No diarrhea.  Past Medical History  Diagnosis Date  . Depression   . Anxiety   . Carpal tunnel syndrome, bilateral   . GERD (gastroesophageal reflux disease)   . Neuropathy    Past Surgical History  Procedure Laterality Date  . Rotator cuff repair Right 2011  . Hammer toe fusion Bilateral 2008  . Carpal tunnel release Right 2003  . Cholecystectomy N/A 02/09/2014    Procedure: LAPAROSCOPIC CHOLECYSTECTOMY WITH INTRAOPERATIVE CHOLANGIOGRAM;  Surgeon: Valarie Merino, MD;  Location: [facility];  Service: General;  Laterality: N/A;   Family History  Problem Relation Age of Onset  . Cancer Mother     pancreatic  . Heart failure Father   . Cancer Paternal Grandmother    History  Substance Use Topics  . Smoking status: Never Smoker   . Smokeless tobacco: Not on file  . Alcohol Use: No   OB History   Grav Para Term Preterm Abortions TAB SAB Ect Mult Living                 Review of Systems  All systems reviewed and negative, other than as noted in HPI.   Allergies  Penicillins  Home Medications   Prior to Admission medications   Medication Sig Start Date End Date Taking? Authorizing Provider  ALPRAZolam Prudy Feeler) 0.5 MG tablet Take 0.5 mg by mouth 2 (two) times daily.   Yes  Historical Provider, MD  atenolol (TENORMIN) 50 MG tablet Take 50 mg by mouth daily.   Yes Historical Provider, MD  cloNIDine (CATAPRES) 0.1 MG tablet Take 1 tablet (0.1 mg total) by mouth 3 (three) times daily. 05/25/13  Yes Maurice March, MD  gabapentin (NEURONTIN) 300 MG capsule Take 1 capsule in the morning and 2 capsules at bedtime. 07/25/13  Yes Maurice March, MD  gabapentin (NEURONTIN) 300 MG capsule Take 300 mg by mouth 3 (three) times daily.   Yes Historical Provider, MD  hydrochlorothiazide (HYDRODIURIL) 25 MG tablet Take 1 tablet (25 mg total) by mouth daily. 05/25/13  Yes Maurice March, MD  hydrOXYzine (VISTARIL) 50 MG capsule Take 50 mg by mouth 2 (two) times daily as needed for anxiety.   Yes Historical Provider, MD  mirtazapine (REMERON) 30 MG tablet Take 30 mg by mouth at bedtime.   Yes Historical Provider, MD  omeprazole (PRILOSEC) 20 MG capsule Take 40 mg by mouth daily.    Yes Historical Provider, MD  OxyCODONE HCl ER (OXYCONTIN) 30 MG T12A Take 30 mg by mouth every 12 (twelve) hours.   Yes Historical Provider, MD  sucralfate (CARAFATE) 1 G tablet Take 1 g by mouth 3 (three) times daily.    Yes Historical Provider, MD  trazodone (DESYREL) 300 MG tablet Take 300 mg by mouth at bedtime.   Yes Historical Provider, MD  clonazePAM (KLONOPIN) 0.5 MG tablet  03/05/14   Historical Provider, MD   BP 145/89  Pulse 93  Temp(Src) 97.5 F (36.4 C) (Oral)  Resp 18  Ht 5\' 8"  (1.727 m)  Wt 199 lb (90.266 kg)  BMI 30.26 kg/m2  SpO2 100%  LMP 02/03/2014 Physical Exam  Nursing note and vitals reviewed. Constitutional: She appears well-developed and well-nourished. No distress.  HENT:  Head: Normocephalic and atraumatic.  Eyes: Conjunctivae are normal. Right eye exhibits no discharge. Left eye exhibits no discharge.  Neck: Neck supple.  Cardiovascular: Normal rate, regular rhythm and normal heart sounds.  Exam reveals no gallop and no friction rub.   No murmur  heard. Pulmonary/Chest: Effort normal and breath sounds normal. No respiratory distress.  Abdominal: Soft. She exhibits no distension. There is tenderness.  Tenderness involuntary guarding GU epigastrium and periumbilically. No distention.  Genitourinary:  No CVA tenderness  Musculoskeletal: She exhibits no edema and no tenderness.  Neurological: She is alert.  Skin: Skin is warm and dry.  Psychiatric: Her behavior is normal. Thought content normal.  Anxious    ED Course  Procedures (including critical care time) Labs Review Labs Reviewed  COMPREHENSIVE METABOLIC PANEL - Abnormal; Notable for the following:    Potassium 3.2 (*)    Glucose, Bld 112 (*)    GFR calc non Af Amer 75 (*)    GFR calc Af Amer 87 (*)    All other components within normal limits  CBC WITH DIFFERENTIAL  LIPASE, BLOOD    Imaging Review No results found.  Ct Abdomen Pelvis W Contrast  03/21/2014   CLINICAL DATA:  Epigastric pain, back pain, and nausea. Post recent cholecystectomy.  EXAM: CT ABDOMEN AND PELVIS WITH CONTRAST  TECHNIQUE: Multidetector CT imaging of the abdomen and pelvis was performed using the standard protocol following bolus administration of intravenous contrast.  CONTRAST:  100mL OMNIPAQUE IOHEXOL 300 MG/ML  SOLN  COMPARISON:  01/22/2014  FINDINGS: Minimal atelectasis in the lung bases.  Surgical absence of the gallbladder. Mild bile duct dilatation is likely physiologic in a postcholecystectomy patient. Minimal fluid demonstrated in the gallbladder fossa on the likely postoperative.  The liver, spleen, pancreas, adrenal glands, kidneys, abdominal aorta, inferior vena cava, and retroperitoneal lymph nodes are unremarkable. Stomach, small bowel, and colon are not abnormally distended. No free air or free fluid is demonstrated in the abdomen. Abdominal wall musculature appears intact.  Pelvis: The appendix is normal. Uterus and ovaries are not enlarged. There is a small amount of free fluid in the  pelvis which is probably physiologic. Bladder wall is not thickened. No pelvic mass or lymphadenopathy. No evidence of diverticulitis. No destructive bone lesions.  IMPRESSION: Surgical absence of the gallbladder with physiologic bile duct dilatation. Small amount of free fluid in the pelvis is probably physiologic. Minimal fluid in the gallbladder fossa is likely postoperative.   Electronically Signed   By: Burman NievesWilliam  Stevens M.D.   On: 03/21/2014 23:40    EKG Interpretation None      MDM   Final diagnoses:  Epigastric pain    48 year old female with abdominal pain. Epigastric. No peritonitis. Workup unremarkable. Symptoms improved. Workup has been reassuring. I continue symptomatic treatment. Return precautions were discussed.    Raeford RazorStephen Niki Payment, MD 03/26/14 1202

## 2014-03-21 NOTE — ED Notes (Signed)
Pt presents with RUQ abd burning with radiation to epigastric area. Pt had Lap Chole 6/19, pt states pain severe and feels same. Pt contacted CS, she was told to come to ED

## 2014-03-21 NOTE — Discharge Instructions (Signed)

## 2014-03-21 NOTE — ED Notes (Signed)
Pt. Made aware for the need of urine. 

## 2014-07-18 ENCOUNTER — Ambulatory Visit: Payer: Self-pay | Admitting: Diagnostic Neuroimaging

## 2014-08-07 ENCOUNTER — Ambulatory Visit: Payer: Self-pay | Admitting: Diagnostic Neuroimaging

## 2014-08-10 ENCOUNTER — Encounter: Payer: Self-pay | Admitting: Diagnostic Neuroimaging

## 2014-10-07 IMAGING — CT CT ABD-PELV W/ CM
1 of 3 series · 14 of 32 positions shown, 19 images · IV contrast (OMNIPAQUE 300)
Comparison: 01/22/2014

CLINICAL DATA: Epigastric pain, back pain, and nausea. Post recent
cholecystectomy.

EXAM:
CT ABDOMEN AND PELVIS WITH CONTRAST
TECHNIQUE: Multidetector CT imaging of the abdomen and pelvis was performed
using the standard protocol following bolus administration of
intravenous contrast.
CONTRAST:  100mL OMNIPAQUE IOHEXOL 300 MG/ML  SOLN

[Series 2: abd/pel with · axial · 0.74mm/px · z∈[-552,-162]mm · 14 of 88 slices shown, 19 images]
[im 5/88  soft-tissue]
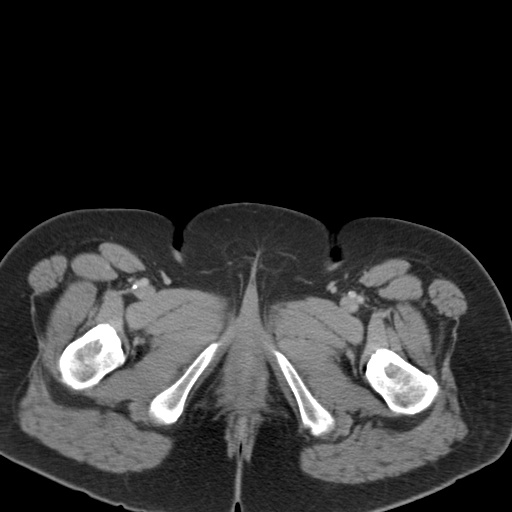
[im 5/88  bone]
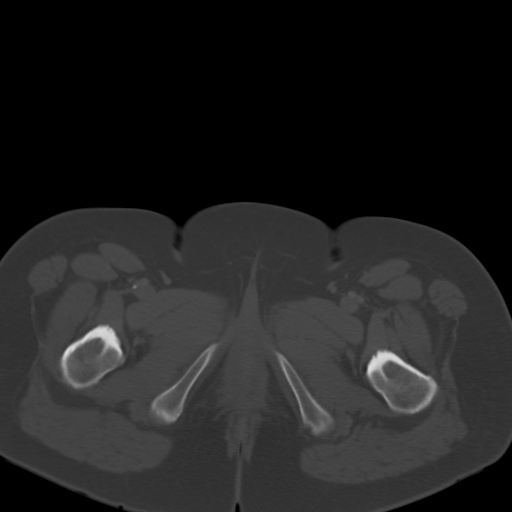
[im 14/88  soft-tissue]
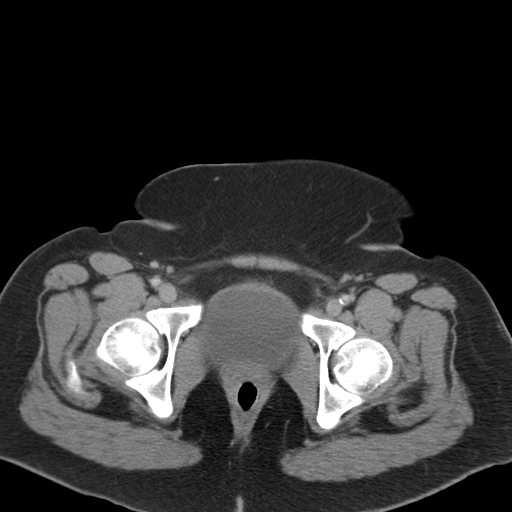
[im 18/88  soft-tissue]
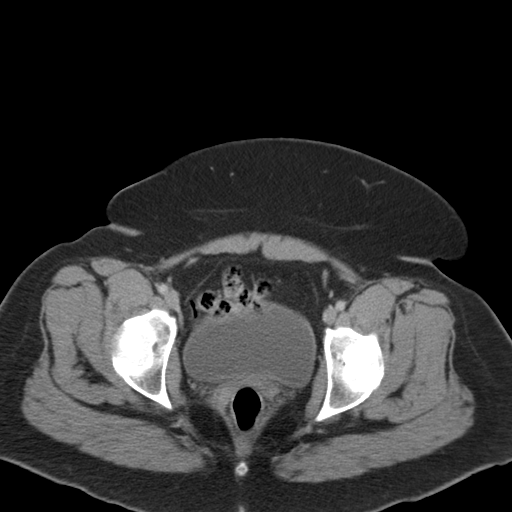
[im 27/88  soft-tissue]
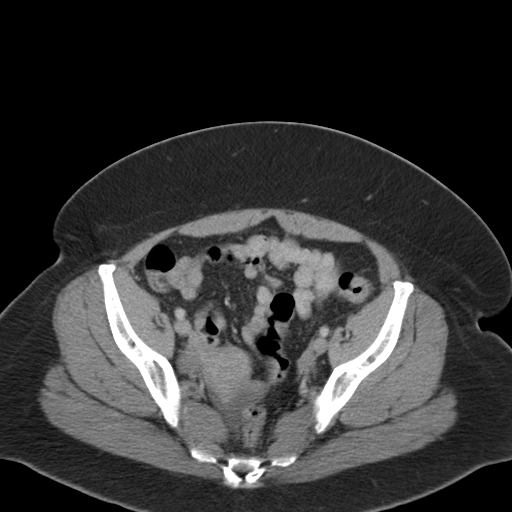
[im 31/88  soft-tissue]
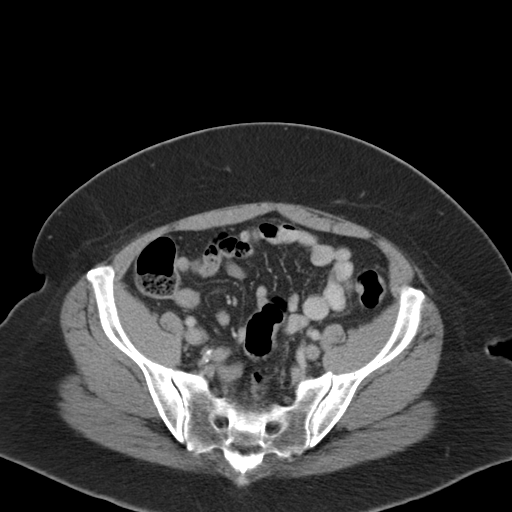
[im 40/88  soft-tissue]
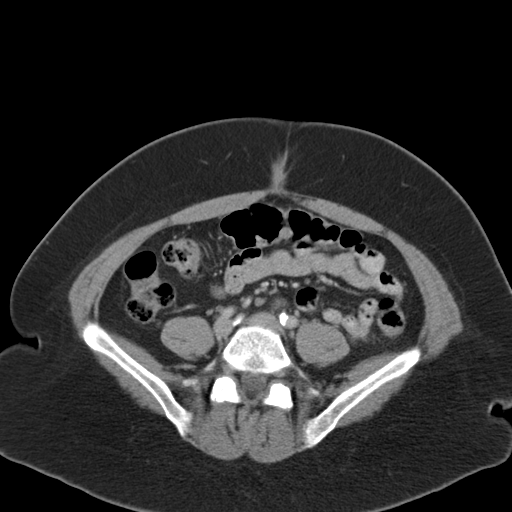
[im 44/88  soft-tissue]
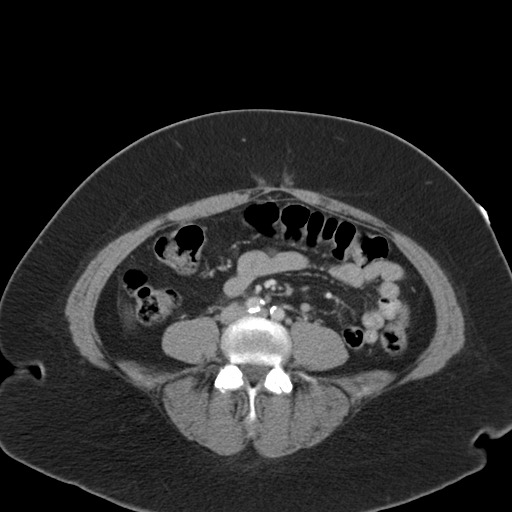
[im 48/88  soft-tissue]
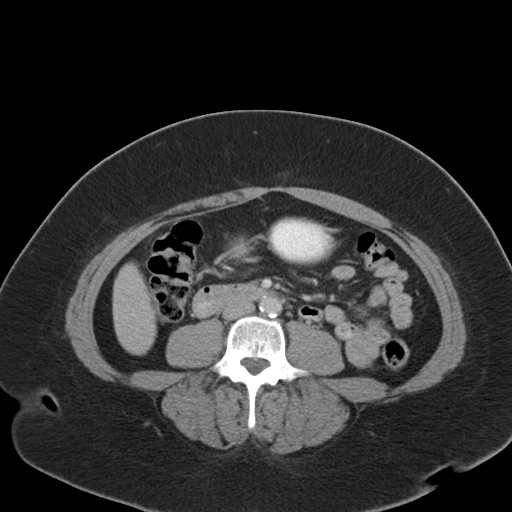
[im 57/88  soft-tissue]
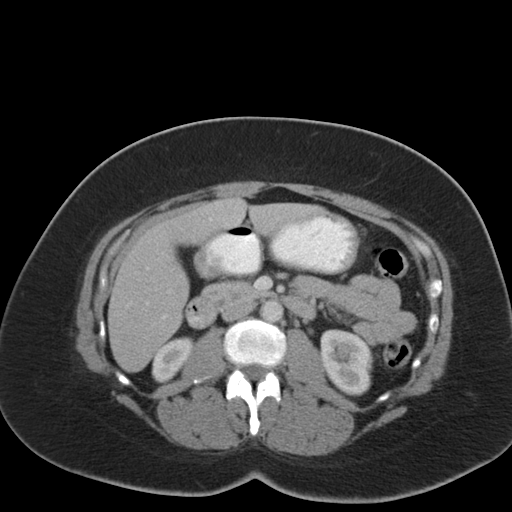
[im 57/88  bone]
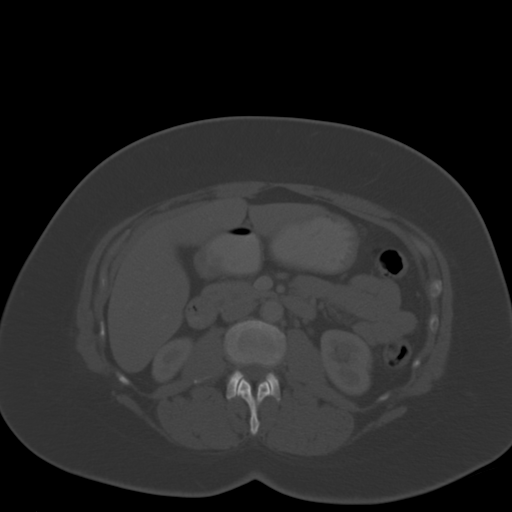
[im 61/88  soft-tissue]
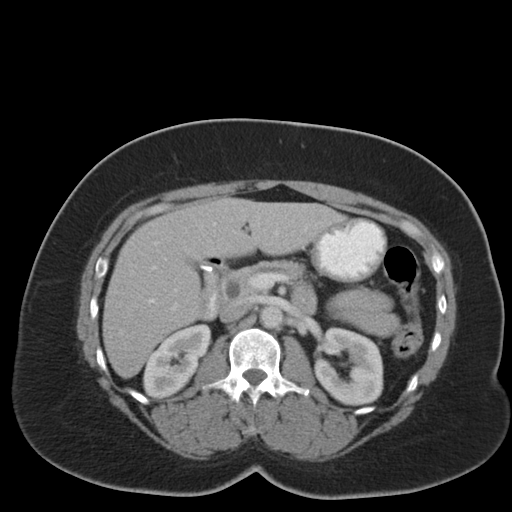
[im 70/88  soft-tissue]
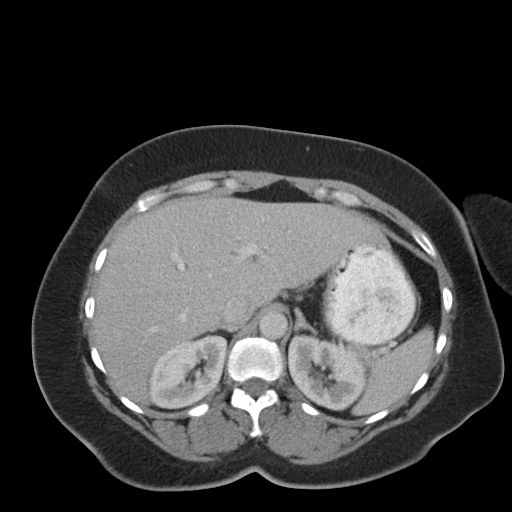
[im 70/88  lung]
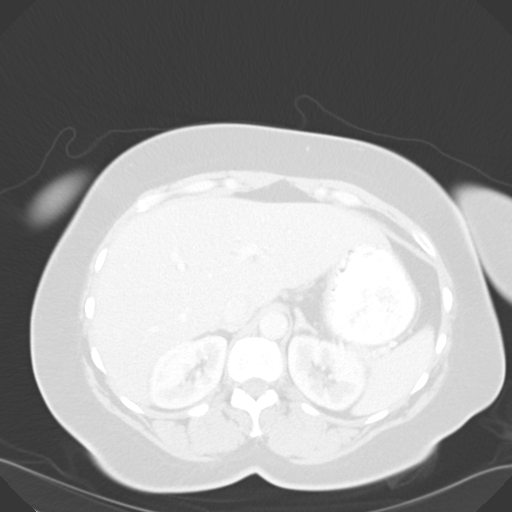
[im 74/88  soft-tissue]
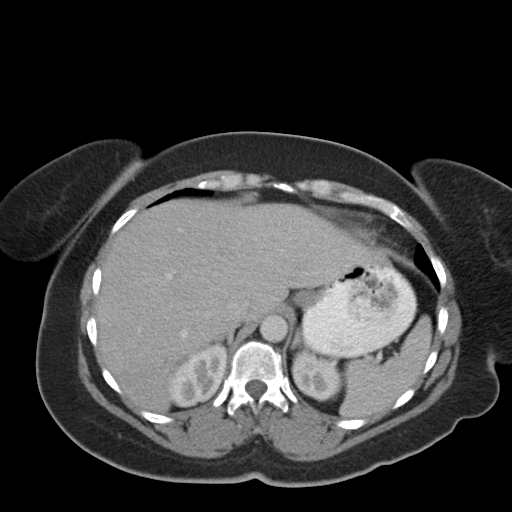
[im 74/88  lung]
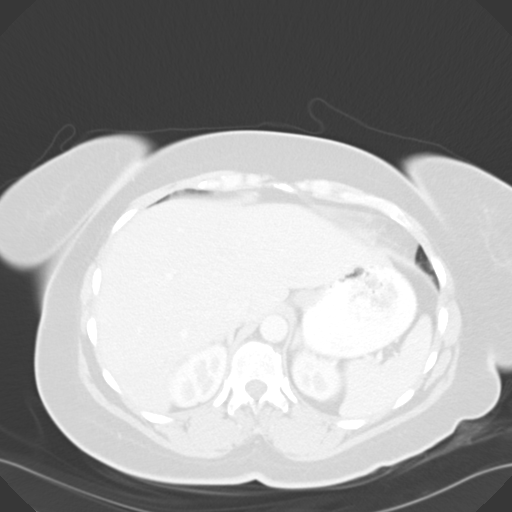
[im 79/88  lung]
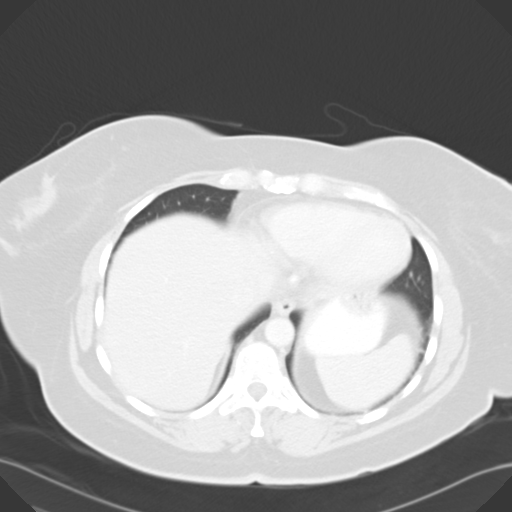
[im 83/88  soft-tissue]
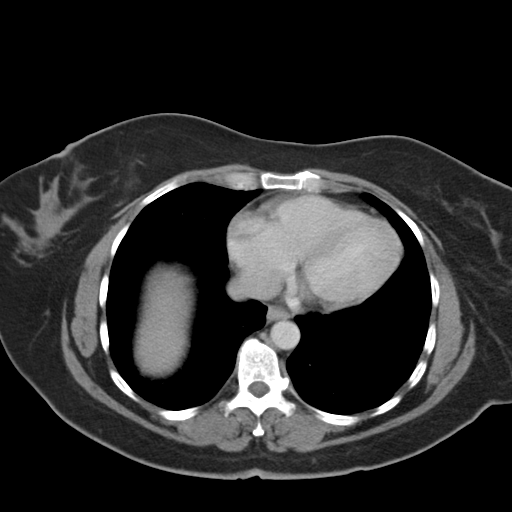
[im 83/88  lung]
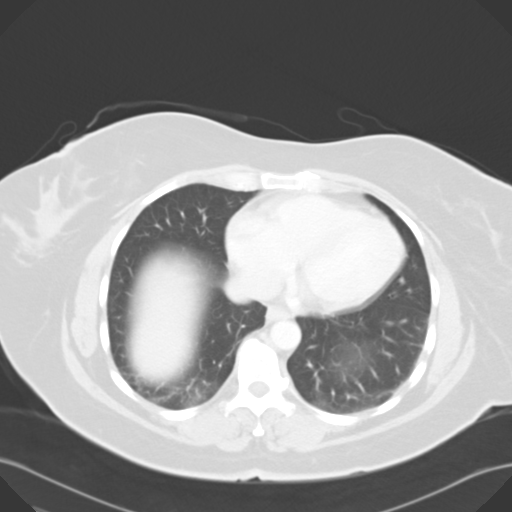

[14 of 32 positions shown; findings below may reference images not displayed]

FINDINGS: Minimal atelectasis in the lung bases.

Surgical absence of the gallbladder. Mild bile duct dilatation is
likely physiologic in a postcholecystectomy patient. Minimal fluid
demonstrated in the gallbladder fossa on the likely postoperative.

The liver, spleen, pancreas, adrenal glands, kidneys, abdominal
aorta, inferior vena cava, and retroperitoneal lymph nodes are
unremarkable. Stomach, small bowel, and colon are not abnormally
distended. No free air or free fluid is demonstrated in the abdomen.
Abdominal wall musculature appears intact.

Pelvis: The appendix is normal. Uterus and ovaries are not enlarged.
There is a small amount of free fluid in the pelvis which is
probably physiologic. Bladder wall is not thickened. No pelvic mass
or lymphadenopathy. No evidence of diverticulitis. No destructive
bone lesions.
IMPRESSION: Surgical absence of the gallbladder with physiologic bile duct
dilatation. Small amount of free fluid in the pelvis is probably
physiologic. Minimal fluid in the gallbladder fossa is likely
postoperative.

## 2014-10-18 ENCOUNTER — Emergency Department (HOSPITAL_COMMUNITY): Payer: Self-pay

## 2014-10-18 ENCOUNTER — Emergency Department (HOSPITAL_COMMUNITY)
Admission: EM | Admit: 2014-10-18 | Discharge: 2014-10-19 | Disposition: A | Discharge status: Home / Self Care | Payer: Self-pay | Attending: Emergency Medicine | Admitting: Emergency Medicine

## 2014-10-18 ENCOUNTER — Encounter (HOSPITAL_COMMUNITY): Payer: Self-pay | Admitting: Family Medicine

## 2014-10-18 DIAGNOSIS — Z79899 Other long term (current) drug therapy: Secondary | ICD-10-CM | POA: Insufficient documentation

## 2014-10-18 DIAGNOSIS — R609 Edema, unspecified: Secondary | ICD-10-CM

## 2014-10-18 DIAGNOSIS — R059 Cough, unspecified: Secondary | ICD-10-CM

## 2014-10-18 DIAGNOSIS — Z8669 Personal history of other diseases of the nervous system and sense organs: Secondary | ICD-10-CM | POA: Insufficient documentation

## 2014-10-18 DIAGNOSIS — J45901 Unspecified asthma with (acute) exacerbation: Secondary | ICD-10-CM | POA: Insufficient documentation

## 2014-10-18 DIAGNOSIS — Z880 Allergy status to penicillin: Secondary | ICD-10-CM | POA: Insufficient documentation

## 2014-10-18 DIAGNOSIS — K219 Gastro-esophageal reflux disease without esophagitis: Secondary | ICD-10-CM | POA: Insufficient documentation

## 2014-10-18 DIAGNOSIS — R05 Cough: Secondary | ICD-10-CM

## 2014-10-18 DIAGNOSIS — M545 Low back pain, unspecified: Secondary | ICD-10-CM

## 2014-10-18 DIAGNOSIS — R600 Localized edema: Secondary | ICD-10-CM | POA: Insufficient documentation

## 2014-10-18 DIAGNOSIS — F419 Anxiety disorder, unspecified: Secondary | ICD-10-CM | POA: Insufficient documentation

## 2014-10-18 DIAGNOSIS — F329 Major depressive disorder, single episode, unspecified: Secondary | ICD-10-CM | POA: Insufficient documentation

## 2014-10-18 DIAGNOSIS — R52 Pain, unspecified: Secondary | ICD-10-CM

## 2014-10-18 DIAGNOSIS — J069 Acute upper respiratory infection, unspecified: Secondary | ICD-10-CM | POA: Insufficient documentation

## 2014-10-18 DIAGNOSIS — R079 Chest pain, unspecified: Secondary | ICD-10-CM | POA: Insufficient documentation

## 2014-10-18 LAB — BASIC METABOLIC PANEL
Anion gap: 9 (ref 5–15)
BUN: 7 mg/dL (ref 6–23)
CO2: 27 mmol/L (ref 19–32)
Calcium: 9.2 mg/dL (ref 8.4–10.5)
Chloride: 103 mmol/L (ref 96–112)
Creatinine, Ser: 1.1 mg/dL (ref 0.50–1.10)
GFR calc Af Amer: 67 mL/min — ABNORMAL LOW (ref >90)
GFR calc non Af Amer: 58 mL/min — ABNORMAL LOW (ref >90)
Glucose, Bld: 85 mg/dL (ref 70–99)
Potassium: 3.6 mmol/L (ref 3.5–5.1)
Sodium: 139 mmol/L (ref 135–145)

## 2014-10-18 LAB — CBC
HCT: 38 % (ref 36.0–46.0)
Hemoglobin: 12.8 g/dL (ref 12.0–15.0)
MCH: 34.8 pg — ABNORMAL HIGH (ref 26.0–34.0)
MCHC: 33.7 g/dL (ref 30.0–36.0)
MCV: 103.3 fL — ABNORMAL HIGH (ref 78.0–100.0)
Platelets: 261 10*3/uL (ref 150–400)
RBC: 3.68 MIL/uL — ABNORMAL LOW (ref 3.87–5.11)
RDW: 13.3 % (ref 11.5–15.5)
WBC: 8.6 10*3/uL (ref 4.0–10.5)

## 2014-10-18 LAB — I-STAT TROPONIN, ED: Troponin i, poc: 0 ng/mL (ref 0.00–0.08)

## 2014-10-18 LAB — BRAIN NATRIURETIC PEPTIDE: B Natriuretic Peptide: 43 pg/mL (ref 0.0–100.0)

## 2014-10-18 NOTE — ED Notes (Addendum)
Pt here for SOB that has been worse over the past month. sts some coughing. sts hard to catch her breath. Pt sts chest and back pain and also some swelling in legs.

## 2014-10-18 NOTE — ED Provider Notes (Signed)
CSN: [id number]     Arrival date & time 10/18/14  1924 History  This chart was scribed for Geoffery Lyonsouglas Merly Hinkson, MD by Annye AsaAnna Dorsett, ED Scribe. This patient was seen in room BH04C/BH04C and the patient's care was started at 12:09 AM.    Chief Complaint  Patient presents with  . Shortness of Breath   HPI   HPI Comments: Amanda [name] is a 49 y.o. female who presents to the Emergency Department complaining of 1 month of worsening SOB with chest pain and lower back pain that worsens with deep breathing. She also reports nonproductive cough and swelling in her legs. She has not yet seen an MD for this issue.   Patient notes she was told she had "a history of asthma" last year but she denies any prior experience with asthma; she does not have an inhaler at this time. 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   Past Medical History  Diagnosis Date  . Depression   . Anxiety   . Carpal tunnel syndrome, bilateral   . GERD (gastroesophageal reflux disease)   . Neuropathy    Past Surgical History  Procedure Laterality Date  . Rotator cuff repair Right 2011  . Hammer toe fusion Bilateral 2008  . Carpal tunnel release Right 2003  . Cholecystectomy N/A 02/09/2014    Procedure: LAPAROSCOPIC CHOLECYSTECTOMY WITH INTRAOPERATIVE CHOLANGIOGRAM;  Surgeon: Valarie MerinoMatthew B Martin, MD;  Location: [facility];  Service: General;  Laterality: N/A;   Family History  Problem Relation Age of Onset  . Cancer Mother     pancreatic  . Heart failure Father   . Cancer Paternal Grandmother    History  Substance Use Topics  . Smoking status: Never Smoker   . Smokeless tobacco: Not on file  . Alcohol Use: No   OB History    No data available     Review of Systems  A complete 10 system review of systems was obtained and all systems are negative except as noted in the HPI and PMH.   Allergies  Penicillins  Home Medications   Prior to Admission medications   Medication Sig Start Date End Date  Taking? Authorizing Provider  ALPRAZolam Prudy Feeler(XANAX) 0.5 MG tablet Take 0.5 mg by mouth 2 (two) times daily.    Historical Provider, MD  atenolol (TENORMIN) 50 MG tablet Take 50 mg by mouth daily.    Historical Provider, MD  clonazePAM Scarlette Calico(KLONOPIN) 0.5 MG tablet  03/05/14   Historical Provider, MD  cloNIDine (CATAPRES) 0.1 MG tablet Take 1 tablet (0.1 mg total) by mouth 3 (three) times daily. 05/25/13   Maurice MarchBarbara B McPherson, MD  gabapentin (NEURONTIN) 300 MG capsule Take 1 capsule in the morning and 2 capsules at bedtime. 07/25/13   Maurice MarchBarbara B McPherson, MD  gabapentin (NEURONTIN) 300 MG capsule Take 300 mg by mouth 3 (three) times daily.    Historical Provider, MD  hydrochlorothiazide (HYDRODIURIL) 25 MG tablet Take 1 tablet (25 mg total) by mouth daily. 05/25/13   Maurice MarchBarbara B McPherson, MD  hydrOXYzine (VISTARIL) 50 MG capsule Take 50 mg by mouth 2 (two) times daily as needed for anxiety.    Historical Provider, MD  mirtazapine (REMERON) 30 MG tablet Take 30 mg by mouth at bedtime.    Historical Provider, MD  omeprazole (PRILOSEC) 20 MG capsule Take 40 mg by mouth daily.     Historical Provider, MD  OxyCODONE HCl ER (OXYCONTIN) 30 MG T12A Take 30 mg by mouth  every 12 (twelve) hours.    Historical Provider, MD  sucralfate (CARAFATE) 1 G tablet Take 1 g by mouth 3 (three) times daily.     Historical Provider, MD  trazodone (DESYREL) 300 MG tablet Take 300 mg by mouth at bedtime.    Historical Provider, MD   BP 145/92 mmHg  Pulse 108  Temp(Src) 97.7 F (36.5 C)  Resp 22  SpO2 100%  LMP 09/19/2014 Physical Exam  Constitutional: She is oriented to person, place, and time. She appears well-developed and well-nourished. No distress.  HENT:  Head: Normocephalic and atraumatic.  Mouth/Throat: Oropharynx is clear and moist. No oropharyngeal exudate.  Eyes: EOM are normal. Pupils are equal, round, and reactive to light.  Neck: Normal range of motion. Neck supple.  Cardiovascular: Normal rate, regular rhythm  and normal heart sounds.  Exam reveals no gallop and no friction rub.   No murmur heard. Pulmonary/Chest: Effort normal. No respiratory distress. She has no wheezes. She has no rales.  Abdominal: Soft. There is no tenderness. There is no rebound and no guarding.  Musculoskeletal: Normal range of motion. She exhibits no edema.  Neurological: She is alert and oriented to person, place, and time.  Skin: Skin is warm and dry. No rash noted.  Psychiatric: She has a normal mood and affect. Her behavior is normal.  Nursing note and vitals reviewed.   ED Course  Procedures   DIAGNOSTIC STUDIES: Oxygen Saturation is 100% on RA, normal by my interpretation.    COORDINATION OF CARE: 12:12 AM Discussed treatment plan with pt at bedside and pt agreed to plan.   Labs Review Labs Reviewed  CBC - Abnormal; Notable for the following:    RBC 3.68 (*)    MCV 103.3 (*)    MCH 34.8 (*)    All other components within normal limits  BASIC METABOLIC PANEL - Abnormal; Notable for the following:    GFR calc non Af Amer 58 (*)    GFR calc Af Amer 67 (*)    All other components within normal limits  BRAIN NATRIURETIC PEPTIDE  I-STAT TROPOININ, ED    Imaging Review Dg Chest 2 View  10/18/2014   CLINICAL DATA:  Chest pain and shortness of breath for 1 month.  EXAM: CHEST  2 VIEW  COMPARISON:  None.  FINDINGS: Normal heart size and pulmonary vascularity. No focal airspace disease or consolidation in the lungs. No blunting of costophrenic angles. No pneumothorax. Mediastinal contours appear intact. Postoperative changes in the right shoulder. Surgical clips in the right upper quadrant.  IMPRESSION: No active cardiopulmonary disease.   Electronically Signed   By: Burman Nieves M.D.   On: 10/18/2014 21:09   Dg Lumbar Spine 2-3 Views  10/18/2014   CLINICAL DATA:  Low back pain for 1 month.  EXAM: LUMBAR SPINE - 2-3 VIEW  COMPARISON:  Lumbar spine 02/17/2013  FINDINGS: The alignment is maintained. Vertebral  body heights are normal. There is no listhesis. The posterior elements are intact. Disc spaces are preserved. No fracture. Sacroiliac joints are symmetric and normal. Surgical clips in the right upper quadrant of the abdomen.  IMPRESSION: Unremarkable radiographs of the lumbar spine.   Electronically Signed   By: Rubye Oaks M.D.   On: 10/18/2014 21:12     Date: 10/24/2014  Rate: 109  Rhythm: Sinus Tachycardia  QRS Axis: normal   Intervals: normal  ST/T Wave abnormalities: none  Conduction Disutrbances:none  Narrative Interpretation:   Old EKG Reviewed: Unchanged from priors  MDM   Final diagnoses:  Cough  Pain    Patient presents with low back pain, intermittent swelling of the legs, and dyspnea that appear unrelated.  Workup reveals no emergent pathology, including no evidence for chf, pe, acute cardiac ischemia.  The low back pain appears musculoskeletal.   She is appropriate for discharge, to return prn.  I personally performed the services described in this documentation, which was scribed in my presence. The recorded information has been reviewed and is accurate.       Geoffery Lyons, MD 10/24/14 [id number]

## 2014-10-19 MED ORDER — ALBUTEROL SULFATE HFA 108 (90 BASE) MCG/ACT IN AERS
2.0000 | INHALATION_SPRAY | RESPIRATORY_TRACT | Status: DC | PRN
Start: 2014-10-19 — End: 2014-10-19
  Administered 2014-10-19: 2 via RESPIRATORY_TRACT
  Filled 2014-10-19: qty 6.7

## 2014-10-19 MED ORDER — AZITHROMYCIN 250 MG PO TABS: ORAL_TABLET | ORAL | Status: DC

## 2014-10-19 MED ORDER — HYDROCOD POLST-CHLORPHEN POLST 10-8 MG/5ML PO LQCR: 5.0000 mL | Freq: Two times a day (BID) | ORAL | Status: DC | PRN

## 2014-10-19 NOTE — Progress Notes (Signed)
  CARE MANAGEMENT ED NOTE 10/19/2014  Patient:  Mariel AloeDUNSTON,Jozlin A   Account Number:  [id number]  Date Initiated:  10/19/2014  Documentation initiated by:  Edd ArbourGIBBS,KIMBERLY  Subjective/Objective Assessment:   49 yr old bcbs SOB that has been worse over the past month. sts some coughing. sts hard to catch her breath. Pt sts chest and back pain and also some swelling in legs.          Subjective/Objective Assessment Detail:   pcp takela anderson  [facility] per [place]     Action/Plan:   ED CM received a call from [place] Coordinator [facility] requesting assist with getting a copy of only the Zithromax Faxed copy of zithromax to [phone] after speaking with brittany at [phone]   Action/Plan Detail:   Anticipated DC Date:  10/19/2014     Status Recommendation to Physician:   Result of Recommendation:    Other ED Services  Consult Working Plan    DC Planning Services  Other  Outpatient Services - Pt will follow up  Medication Assistance    Choice offered to / List presented to:            Status of service:  Completed, signed off  ED Comments:   ED Comments Detail:

## 2014-10-19 NOTE — Discharge Instructions (Signed)
Albuterol inhaler: 2 puffs every 4 hours as needed for shortness of breath.  Zithromax as prescribed.  Tussionex as prescribed as needed for cough.  Return to the emergency department if your symptoms substantially worsen or change.   Back Pain, Adult Low back pain is very common. About 1 in 5 people have back pain.The cause of low back pain is rarely dangerous. The pain often gets better over time.About half of people with a sudden onset of back pain feel better in just 2 weeks. About 8 in 10 people feel better by 6 weeks.  CAUSES Some common causes of back pain include:  Strain of the muscles or ligaments supporting the spine.  Wear and tear (degeneration) of the spinal discs.  Arthritis.  Direct injury to the back. DIAGNOSIS Most of the time, the direct cause of low back pain is not known.However, back pain can be treated effectively even when the exact cause of the pain is unknown.Answering your caregiver's questions about your overall health and symptoms is one of the most accurate ways to make sure the cause of your pain is not dangerous. If your caregiver needs more information, he or she may order lab work or imaging tests (X-rays or MRIs).However, even if imaging tests show changes in your back, this usually does not require surgery. HOME CARE INSTRUCTIONS For many people, back pain returns.Since low back pain is rarely dangerous, it is often a condition that people can learn to [facility] LLC[facility]on their own.   Remain active. It is stressful on the back to sit or stand in one place. Do not sit, drive, or stand in one place for more than 30 minutes at a time. Take short walks on level surfaces as soon as pain allows.Try to increase the length of time you walk each day.  Do not stay in bed.Resting more than 1 or 2 days can delay your recovery.  Do not avoid exercise or work.Your body is made to move.It is not dangerous to be active, even though your back may hurt.Your back will  likely heal faster if you return to being active before your pain is gone.  Pay attention to your body when you bend and lift. Many people have less discomfortwhen lifting if they bend their knees, keep the load close to their bodies,and avoid twisting. Often, the most comfortable positions are those that put less stress on your recovering back.  Find a comfortable position to sleep. Use a firm mattress and lie on your side with your knees slightly bent. If you lie on your back, put a pillow under your knees.  Only take over-the-counter or prescription medicines as directed by your caregiver. Over-the-counter medicines to reduce pain and inflammation are often the most helpful.Your caregiver may prescribe muscle relaxant drugs.These medicines help dull your pain so you can more quickly return to your normal activities and healthy exercise.  Put ice on the injured area.  Put ice in a plastic bag.  Place a towel between your skin and the bag.  Leave the ice on for 15-20 minutes, 03-04 times a day for the first 2 to 3 days. After that, ice and heat may be alternated to reduce pain and spasms.  Ask your caregiver about trying back exercises and gentle massage. This may be of some benefit.  Avoid feeling anxious or stressed.Stress increases muscle tension and can worsen back pain.It is important to recognize when you are anxious or stressed and learn ways to manage it.Exercise is a great option.  SEEK MEDICAL CARE IF:  You have pain that is not relieved with rest or medicine.  You have pain that does not improve in 1 week.  You have new symptoms.  You are generally not feeling well. SEEK IMMEDIATE MEDICAL CARE IF:   You have pain that radiates from your back into your legs.  You develop new bowel or bladder control problems.  You have unusual weakness or numbness in your arms or legs.  You develop nausea or vomiting.  You develop abdominal pain.  You feel faint. Document  Released: 08/10/2005 Document Revised: 02/09/2012 Document Reviewed: 12/12/2013 [facility] Patient Information 2015 [place], [place]. This information is not intended to replace advice given to you by your health care provider. Make sure you discuss any questions you have with your health care provider.  Upper Respiratory Infection, Adult An upper respiratory infection (URI) is also sometimes known as the common cold. The upper respiratory tract includes the nose, sinuses, throat, trachea, and bronchi. Bronchi are the airways leading to the lungs. Most people improve within 1 week, but symptoms can last up to 2 weeks. A residual cough may last even longer.  CAUSES Many different viruses can infect the tissues lining the upper respiratory tract. The tissues become irritated and inflamed and often become very moist. Mucus production is also common. A cold is contagious. You can easily spread the virus to others by oral contact. This includes kissing, sharing a glass, coughing, or sneezing. Touching your mouth or nose and then touching a surface, which is then touched by another person, can also spread the virus. SYMPTOMS  Symptoms typically develop 1 to 3 days after you come in contact with a cold virus. Symptoms vary from person to person. They may include:  Runny nose.  Sneezing.  Nasal congestion.  Sinus irritation.  Sore throat.  Loss of voice (laryngitis).  Cough.  Fatigue.  Muscle aches.  Loss of appetite.  Headache.  Low-grade fever. DIAGNOSIS  You might diagnose your own cold based on familiar symptoms, since most people get a cold 2 to 3 times a year. Your caregiver can confirm this based on your exam. Most importantly, your caregiver can check that your symptoms are not due to another disease such as strep throat, sinusitis, pneumonia, asthma, or epiglottitis. Blood tests, throat tests, and X-rays are not necessary to diagnose a common cold, but they may sometimes be helpful in  excluding other more serious diseases. Your caregiver will decide if any further tests are required. RISKS AND COMPLICATIONS  You may be at risk for a more severe case of the common cold if you smoke cigarettes, have chronic heart disease (such as heart failure) or lung disease (such as asthma), or if you have a weakened immune system. The very young and very old are also at risk for more serious infections. Bacterial sinusitis, middle ear infections, and bacterial pneumonia can complicate the common cold. The common cold can worsen asthma and chronic obstructive pulmonary disease (COPD). Sometimes, these complications can require emergency medical care and may be life-threatening. PREVENTION  The best way to protect against getting a cold is to practice good hygiene. Avoid oral or hand contact with people with cold symptoms. Wash your hands often if contact occurs. There is no clear evidence that vitamin C, vitamin E, echinacea, or exercise reduces the chance of developing a cold. However, it is always recommended to get plenty of rest and practice good nutrition. TREATMENT  Treatment is directed at relieving symptoms. There  is no cure. Antibiotics are not effective, because the infection is caused by a virus, not by bacteria. Treatment may include:  Increased fluid intake. Sports drinks offer valuable electrolytes, sugars, and fluids.  Breathing heated mist or steam (vaporizer or shower).  Eating chicken soup or other clear broths, and maintaining good nutrition.  Getting plenty of rest.  Using gargles or lozenges for comfort.  Controlling fevers with ibuprofen or acetaminophen as directed by your caregiver.  Increasing usage of your inhaler if you have asthma. Zinc gel and zinc lozenges, taken in the first 24 hours of the common cold, can shorten the duration and lessen the severity of symptoms. Pain medicines may help with fever, muscle aches, and throat pain. A variety of non-prescription  medicines are available to treat congestion and runny nose. Your caregiver can make recommendations and may suggest nasal or lung inhalers for other symptoms.  HOME CARE INSTRUCTIONS   Only take over-the-counter or prescription medicines for pain, discomfort, or fever as directed by your caregiver.  Use a warm mist humidifier or inhale steam from a shower to increase air moisture. This may keep secretions moist and make it easier to breathe.  Drink enough water and fluids to keep your urine clear or pale yellow.  Rest as needed.  Return to work when your temperature has returned to normal or as your caregiver advises. You may need to stay home longer to avoid infecting others. You can also use a face mask and careful hand washing to prevent spread of the virus. SEEK MEDICAL CARE IF:   After the first few days, you feel you are getting worse rather than better.  You need your caregiver's advice about medicines to control symptoms.  You develop chills, worsening shortness of breath, or brown or red sputum. These may be signs of pneumonia.  You develop yellow or brown nasal discharge or pain in the face, especially when you bend forward. These may be signs of sinusitis.  You develop a fever, swollen neck glands, pain with swallowing, or white areas in the back of your throat. These may be signs of strep throat. SEEK IMMEDIATE MEDICAL CARE IF:   You have a fever.  You develop severe or persistent headache, ear pain, sinus pain, or chest pain.  You develop wheezing, a prolonged cough, cough up blood, or have a change in your usual mucus (if you have chronic lung disease).  You develop sore muscles or a stiff neck. Document Released: 02/03/2001 Document Revised: 11/02/2011 Document Reviewed: 11/15/2013 [facility] Patient Information 2015 [place], [place]. This information is not intended to replace advice given to you by your health care provider. Make sure you discuss any questions you have  with your health care provider.

## 2014-10-19 NOTE — ED Notes (Signed)
Patient is alert and orientedx4.  Patient was explained discharge instructions and they understood them with no questions.   

## 2014-11-15 ENCOUNTER — Emergency Department (HOSPITAL_COMMUNITY)
Admission: EM | Admit: 2014-11-15 | Discharge: 2014-11-16 | Disposition: A | Discharge status: Other Acute Inpatient Hospital | Payer: BLUE CROSS/BLUE SHIELD | Attending: Emergency Medicine | Admitting: Emergency Medicine

## 2014-11-15 DIAGNOSIS — K219 Gastro-esophageal reflux disease without esophagitis: Secondary | ICD-10-CM | POA: Insufficient documentation

## 2014-11-15 DIAGNOSIS — F419 Anxiety disorder, unspecified: Secondary | ICD-10-CM | POA: Insufficient documentation

## 2014-11-15 DIAGNOSIS — F329 Major depressive disorder, single episode, unspecified: Secondary | ICD-10-CM | POA: Insufficient documentation

## 2014-11-15 DIAGNOSIS — Z880 Allergy status to penicillin: Secondary | ICD-10-CM | POA: Diagnosis not present

## 2014-11-15 DIAGNOSIS — Z792 Long term (current) use of antibiotics: Secondary | ICD-10-CM | POA: Insufficient documentation

## 2014-11-15 DIAGNOSIS — Y906 Blood alcohol level of 120-199 mg/100 ml: Secondary | ICD-10-CM | POA: Insufficient documentation

## 2014-11-15 DIAGNOSIS — G629 Polyneuropathy, unspecified: Secondary | ICD-10-CM | POA: Insufficient documentation

## 2014-11-15 DIAGNOSIS — R45851 Suicidal ideations: Secondary | ICD-10-CM | POA: Diagnosis present

## 2014-11-15 DIAGNOSIS — F1410 Cocaine abuse, uncomplicated: Secondary | ICD-10-CM | POA: Insufficient documentation

## 2014-11-15 DIAGNOSIS — F1010 Alcohol abuse, uncomplicated: Secondary | ICD-10-CM | POA: Diagnosis not present

## 2014-11-15 DIAGNOSIS — Z79899 Other long term (current) drug therapy: Secondary | ICD-10-CM | POA: Insufficient documentation

## 2014-11-15 LAB — CBC
HCT: 41.3 % (ref 36.0–46.0)
Hemoglobin: 14.3 g/dL (ref 12.0–15.0)
MCH: 34.8 pg — ABNORMAL HIGH (ref 26.0–34.0)
MCHC: 34.6 g/dL (ref 30.0–36.0)
MCV: 100.5 fL — ABNORMAL HIGH (ref 78.0–100.0)
Platelets: 277 10*3/uL (ref 150–400)
RBC: 4.11 MIL/uL (ref 3.87–5.11)
RDW: 12.7 % (ref 11.5–15.5)
WBC: 10.7 10*3/uL — ABNORMAL HIGH (ref 4.0–10.5)

## 2014-11-15 NOTE — ED Notes (Signed)
Pt presents with SI - states "I need help, I just don't want to live anymore".  Pt admits to relapsing on ETOH and crack since December after being clean for nine years- admits to drinking a case of beer today and using crack.  Pt tearful in triage but remains cooperative.  Denies HI.

## 2014-11-15 NOTE — ED Notes (Signed)
Staffing and charge aware of need for sitter, pt changed into paper scrubs.

## 2014-11-15 NOTE — ED Notes (Signed)
Patient placed in paper scrubs and wanded by security per this EMT

## 2014-11-16 ENCOUNTER — Encounter (HOSPITAL_COMMUNITY): Payer: Self-pay | Admitting: *Deleted

## 2014-11-16 ENCOUNTER — Encounter (HOSPITAL_COMMUNITY): Payer: Self-pay | Admitting: Behavioral Health

## 2014-11-16 ENCOUNTER — Inpatient Hospital Stay (HOSPITAL_COMMUNITY)
Admission: EM | Admit: 2014-11-16 | Discharge: 2014-11-21 | DRG: 885 | Disposition: A | Discharge status: Home / Self Care | Payer: BLUE CROSS/BLUE SHIELD | Source: Intra-hospital | Attending: Psychiatry | Admitting: Psychiatry

## 2014-11-16 DIAGNOSIS — F17200 Nicotine dependence, unspecified, uncomplicated: Secondary | ICD-10-CM | POA: Diagnosis present

## 2014-11-16 DIAGNOSIS — F332 Major depressive disorder, recurrent severe without psychotic features: Secondary | ICD-10-CM | POA: Diagnosis present

## 2014-11-16 DIAGNOSIS — F1420 Cocaine dependence, uncomplicated: Secondary | ICD-10-CM | POA: Diagnosis present

## 2014-11-16 DIAGNOSIS — F1010 Alcohol abuse, uncomplicated: Secondary | ICD-10-CM | POA: Diagnosis not present

## 2014-11-16 DIAGNOSIS — Y906 Blood alcohol level of 120-199 mg/100 ml: Secondary | ICD-10-CM | POA: Diagnosis present

## 2014-11-16 DIAGNOSIS — R45851 Suicidal ideations: Secondary | ICD-10-CM | POA: Diagnosis present

## 2014-11-16 DIAGNOSIS — F329 Major depressive disorder, single episode, unspecified: Secondary | ICD-10-CM | POA: Diagnosis present

## 2014-11-16 DIAGNOSIS — I10 Essential (primary) hypertension: Secondary | ICD-10-CM | POA: Diagnosis present

## 2014-11-16 DIAGNOSIS — Z79899 Other long term (current) drug therapy: Secondary | ICD-10-CM | POA: Diagnosis not present

## 2014-11-16 DIAGNOSIS — F1020 Alcohol dependence, uncomplicated: Secondary | ICD-10-CM | POA: Diagnosis present

## 2014-11-16 HISTORY — DX: Peptic ulcer, site unspecified, unspecified as acute or chronic, without hemorrhage or perforation: K27.9

## 2014-11-16 HISTORY — DX: Unspecified asthma, uncomplicated: J45.909

## 2014-11-16 HISTORY — DX: Essential (primary) hypertension: I10

## 2014-11-16 LAB — COMPREHENSIVE METABOLIC PANEL
ALT: 21 U/L (ref 0–35)
AST: 23 U/L (ref 0–37)
Albumin: 4.3 g/dL (ref 3.5–5.2)
Alkaline Phosphatase: 84 U/L (ref 39–117)
Anion gap: 11 (ref 5–15)
BUN: 7 mg/dL (ref 6–23)
CO2: 23 mmol/L (ref 19–32)
Calcium: 9.6 mg/dL (ref 8.4–10.5)
Chloride: 102 mmol/L (ref 96–112)
Creatinine, Ser: 0.73 mg/dL (ref 0.50–1.10)
GFR calc Af Amer: >90 mL/min (ref >90)
GFR calc non Af Amer: >90 mL/min (ref >90)
Glucose, Bld: 107 mg/dL — ABNORMAL HIGH (ref 70–99)
Potassium: 3.3 mmol/L — ABNORMAL LOW (ref 3.5–5.1)
Sodium: 136 mmol/L (ref 135–145)
Total Bilirubin: 0.7 mg/dL (ref 0.3–1.2)
Total Protein: 8.2 g/dL (ref 6.0–8.3)

## 2014-11-16 LAB — RAPID URINE DRUG SCREEN, HOSP PERFORMED
Amphetamines: NOT DETECTED
Barbiturates: NOT DETECTED
Benzodiazepines: NOT DETECTED
Cocaine: POSITIVE — AB
Opiates: NOT DETECTED
Tetrahydrocannabinol: NOT DETECTED

## 2014-11-16 LAB — ETHANOL: Alcohol, Ethyl (B): 136 mg/dL — ABNORMAL HIGH (ref 0–9)

## 2014-11-16 LAB — SALICYLATE LEVEL: Salicylate Lvl: <4 mg/dL (ref 2.8–20.0)

## 2014-11-16 LAB — ACETAMINOPHEN LEVEL: Acetaminophen (Tylenol), Serum: <10 ug/mL — ABNORMAL LOW (ref 10–30)

## 2014-11-16 MED ORDER — HYDROXYZINE HCL 50 MG PO TABS
50.0000 mg | ORAL_TABLET | Freq: Two times a day (BID) | ORAL | Status: DC | PRN
  Administered 2014-11-16: 50 mg via ORAL
  Filled 2014-11-16 (×3): qty 1

## 2014-11-16 MED ORDER — GABAPENTIN 300 MG PO CAPS: 300.0000 mg | ORAL_CAPSULE | Freq: Two times a day (BID) | ORAL | Status: DC

## 2014-11-16 MED ORDER — GABAPENTIN 300 MG PO CAPS
300.0000 mg | ORAL_CAPSULE | Freq: Every day | ORAL | Status: DC
  Administered 2014-11-16 – 2014-11-21 (×6): 300 mg via ORAL
  Filled 2014-11-16 (×8): qty 1

## 2014-11-16 MED ORDER — LOPERAMIDE HCL 2 MG PO CAPS
2.0000 mg | ORAL_CAPSULE | ORAL | Status: AC | PRN
  Administered 2014-11-16: 4 mg via ORAL
  Filled 2014-11-16: qty 2

## 2014-11-16 MED ORDER — CHLORDIAZEPOXIDE HCL 25 MG PO CAPS
25.0000 mg | ORAL_CAPSULE | Freq: Three times a day (TID) | ORAL | Status: AC
  Administered 2014-11-17 – 2014-11-18 (×3): 25 mg via ORAL
  Filled 2014-11-16 (×3): qty 1

## 2014-11-16 MED ORDER — ONDANSETRON HCL 4 MG PO TABS: 4.0000 mg | ORAL_TABLET | Freq: Three times a day (TID) | ORAL | Status: DC | PRN

## 2014-11-16 MED ORDER — NICOTINE 21 MG/24HR TD PT24
21.0000 mg | MEDICATED_PATCH | Freq: Every day | TRANSDERMAL | Status: DC
  Administered 2014-11-16 – 2014-11-21 (×6): 21 mg via TRANSDERMAL
  Filled 2014-11-16 (×9): qty 1

## 2014-11-16 MED ORDER — CHLORDIAZEPOXIDE HCL 25 MG PO CAPS
25.0000 mg | ORAL_CAPSULE | Freq: Four times a day (QID) | ORAL | Status: AC
  Administered 2014-11-16 – 2014-11-17 (×4): 25 mg via ORAL
  Filled 2014-11-16 (×4): qty 1

## 2014-11-16 MED ORDER — POTASSIUM CHLORIDE CRYS ER 20 MEQ PO TBCR
40.0000 meq | EXTENDED_RELEASE_TABLET | Freq: Once | ORAL | Status: AC
  Administered 2014-11-16: 40 meq via ORAL
  Filled 2014-11-16: qty 2

## 2014-11-16 MED ORDER — NAPROXEN 500 MG PO TABS
500.0000 mg | ORAL_TABLET | Freq: Three times a day (TID) | ORAL | Status: DC
  Administered 2014-11-16 – 2014-11-17 (×2): 500 mg via ORAL
  Filled 2014-11-16 (×5): qty 1

## 2014-11-16 MED ORDER — ACETAMINOPHEN 325 MG PO TABS
650.0000 mg | ORAL_TABLET | Freq: Four times a day (QID) | ORAL | Status: DC | PRN
  Administered 2014-11-16 – 2014-11-21 (×6): 650 mg via ORAL
  Filled 2014-11-16 (×6): qty 2

## 2014-11-16 MED ORDER — ALUM & MAG HYDROXIDE-SIMETH 200-200-20 MG/5ML PO SUSP
30.0000 mL | ORAL | Status: DC | PRN
  Administered 2014-11-21: 30 mL via ORAL
  Filled 2014-11-16: qty 30

## 2014-11-16 MED ORDER — MIRTAZAPINE 30 MG PO TABS: 30.0000 mg | ORAL_TABLET | Freq: Every day | ORAL | Status: DC

## 2014-11-16 MED ORDER — HYDROXYZINE HCL 25 MG PO TABS
25.0000 mg | ORAL_TABLET | Freq: Four times a day (QID) | ORAL | Status: AC | PRN
  Administered 2014-11-17: 25 mg via ORAL
  Filled 2014-11-16: qty 1

## 2014-11-16 MED ORDER — MAGNESIUM HYDROXIDE 400 MG/5ML PO SUSP: 30.0000 mL | Freq: Every day | ORAL | Status: DC | PRN

## 2014-11-16 MED ORDER — CHLORDIAZEPOXIDE HCL 25 MG PO CAPS
25.0000 mg | ORAL_CAPSULE | Freq: Once | ORAL | Status: AC
  Administered 2014-11-16: 25 mg via ORAL

## 2014-11-16 MED ORDER — ATENOLOL 50 MG PO TABS
50.0000 mg | ORAL_TABLET | Freq: Every day | ORAL | Status: DC
  Administered 2014-11-16: 50 mg via ORAL
  Filled 2014-11-16 (×3): qty 1

## 2014-11-16 MED ORDER — ZOLPIDEM TARTRATE 5 MG PO TABS: 5.0000 mg | ORAL_TABLET | Freq: Every evening | ORAL | Status: DC | PRN

## 2014-11-16 MED ORDER — CHLORDIAZEPOXIDE HCL 25 MG PO CAPS
25.0000 mg | ORAL_CAPSULE | Freq: Four times a day (QID) | ORAL | Status: AC | PRN
  Administered 2014-11-16: 25 mg via ORAL
  Filled 2014-11-16: qty 1

## 2014-11-16 MED ORDER — ONDANSETRON 4 MG PO TBDP
4.0000 mg | ORAL_TABLET | Freq: Four times a day (QID) | ORAL | Status: AC | PRN
  Administered 2014-11-16 – 2014-11-18 (×2): 4 mg via ORAL
  Filled 2014-11-16 (×2): qty 1

## 2014-11-16 MED ORDER — NAPROXEN 500 MG PO TABS
ORAL_TABLET | ORAL | Status: AC
  Administered 2014-11-16: 15:00:00
  Filled 2014-11-16: qty 1

## 2014-11-16 MED ORDER — GABAPENTIN 300 MG PO CAPS
300.0000 mg | ORAL_CAPSULE | Freq: Three times a day (TID) | ORAL | Status: DC
  Administered 2014-11-16: 300 mg via ORAL
  Filled 2014-11-16 (×4): qty 1

## 2014-11-16 MED ORDER — VITAMIN B-1 100 MG PO TABS: 100.0000 mg | ORAL_TABLET | Freq: Every day | ORAL | Status: DC

## 2014-11-16 MED ORDER — POTASSIUM CHLORIDE 20 MEQ PO PACK
20.0000 meq | PACK | Freq: Once | ORAL | Status: DC
  Filled 2014-11-16 (×2): qty 1

## 2014-11-16 MED ORDER — CHLORDIAZEPOXIDE HCL 25 MG PO CAPS
25.0000 mg | ORAL_CAPSULE | Freq: Every day | ORAL | Status: AC
  Administered 2014-11-20: 25 mg via ORAL
  Filled 2014-11-16: qty 1

## 2014-11-16 MED ORDER — ADULT MULTIVITAMIN W/MINERALS CH
1.0000 | ORAL_TABLET | Freq: Every day | ORAL | Status: DC
  Administered 2014-11-16 – 2014-11-21 (×6): 1 via ORAL
  Filled 2014-11-16 (×7): qty 1

## 2014-11-16 MED ORDER — THIAMINE HCL 100 MG/ML IJ SOLN: 100.0000 mg | Freq: Every day | INTRAMUSCULAR | Status: DC

## 2014-11-16 MED ORDER — MIRTAZAPINE 15 MG PO TABS
15.0000 mg | ORAL_TABLET | Freq: Every day | ORAL | Status: DC
  Administered 2014-11-16 – 2014-11-20 (×5): 15 mg via ORAL
  Filled 2014-11-16 (×7): qty 1

## 2014-11-16 MED ORDER — LORAZEPAM 1 MG PO TABS: 0.0000 mg | ORAL_TABLET | Freq: Four times a day (QID) | ORAL | Status: DC

## 2014-11-16 MED ORDER — THIAMINE HCL 100 MG/ML IJ SOLN
100.0000 mg | Freq: Once | INTRAMUSCULAR | Status: AC
  Administered 2014-11-16: 100 mg via INTRAMUSCULAR

## 2014-11-16 MED ORDER — TRAZODONE HCL 150 MG PO TABS
300.0000 mg | ORAL_TABLET | Freq: Every day | ORAL | Status: DC
  Administered 2014-11-16 – 2014-11-20 (×5): 300 mg via ORAL
  Filled 2014-11-16 (×6): qty 2

## 2014-11-16 MED ORDER — IBUPROFEN 200 MG PO TABS
600.0000 mg | ORAL_TABLET | Freq: Three times a day (TID) | ORAL | Status: DC | PRN
  Administered 2014-11-16: 600 mg via ORAL
  Filled 2014-11-16 (×2): qty 1

## 2014-11-16 MED ORDER — CHLORDIAZEPOXIDE HCL 25 MG PO CAPS
25.0000 mg | ORAL_CAPSULE | ORAL | Status: AC
  Administered 2014-11-18 – 2014-11-19 (×2): 25 mg via ORAL
  Filled 2014-11-16 (×2): qty 1

## 2014-11-16 MED ORDER — VITAMIN B-1 100 MG PO TABS
100.0000 mg | ORAL_TABLET | Freq: Every day | ORAL | Status: DC
  Administered 2014-11-17 – 2014-11-21 (×5): 100 mg via ORAL
  Filled 2014-11-16 (×6): qty 1

## 2014-11-16 MED ORDER — LISINOPRIL 10 MG PO TABS
10.0000 mg | ORAL_TABLET | Freq: Once | ORAL | Status: AC
  Administered 2014-11-16: 10 mg via ORAL
  Filled 2014-11-16: qty 1

## 2014-11-16 MED ORDER — SUCRALFATE 1 G PO TABS
1.0000 g | ORAL_TABLET | Freq: Three times a day (TID) | ORAL | Status: DC
  Administered 2014-11-16 – 2014-11-21 (×16): 1 g via ORAL
  Filled 2014-11-16 (×19): qty 1

## 2014-11-16 MED ORDER — ENSURE ENLIVE PO LIQD
237.0000 mL | Freq: Two times a day (BID) | ORAL | Status: DC
  Administered 2014-11-16 – 2014-11-21 (×6): 237 mL via ORAL

## 2014-11-16 MED ORDER — LISINOPRIL 10 MG PO TABS
10.0000 mg | ORAL_TABLET | Freq: Every day | ORAL | Status: DC
  Administered 2014-11-17 – 2014-11-21 (×5): 10 mg via ORAL
  Filled 2014-11-16 (×6): qty 1

## 2014-11-16 MED ORDER — LORAZEPAM 1 MG PO TABS: 0.0000 mg | ORAL_TABLET | Freq: Two times a day (BID) | ORAL | Status: DC

## 2014-11-16 MED ORDER — CHLORDIAZEPOXIDE HCL 25 MG PO CAPS
25.0000 mg | ORAL_CAPSULE | Freq: Four times a day (QID) | ORAL | Status: DC | PRN
  Administered 2014-11-16: 25 mg via ORAL
  Filled 2014-11-16 (×2): qty 1

## 2014-11-16 MED ORDER — GABAPENTIN 300 MG PO CAPS
600.0000 mg | ORAL_CAPSULE | Freq: Every day | ORAL | Status: DC
Start: 2014-11-16 — End: 2014-11-21
  Administered 2014-11-16 – 2014-11-20 (×5): 600 mg via ORAL
  Filled 2014-11-16 (×6): qty 2

## 2014-11-16 NOTE — [facility] Group Notes (Signed)
Adult Psychoeducational Group Note  Date:  11/16/2014 Time:  11:55 PM  Group Topic/Focus:  AA Meeting  Participation Level:  Active  Participation Quality:  Drowsy  Affect:  Depressed and Flat  Cognitive:  Appropriate  Insight: Good  Engagement in Group:  Engaged  Modes of Intervention:  Discussion and Education  Additional Comments:  [name] shared with the group about her relapse and her struggle to get back on track.  Caroll RancherLindsay, Arik Husmann A 11/16/2014, 11:55 PM

## 2014-11-16 NOTE — [facility] Group Notes (Signed)
[facility] LCSW Group Therapy  11/16/2014 3:35 PM  Type of Therapy:  Group Therapy  Participation Level:  Did Not Attend-pt chose to remain in room to sleep.   Summary of Progress/Problems: Feelings around Relapse. Group members discussed the meaning of relapse and shared personal stories of relapse, how it affected them and others, and how they perceived themselves during this time. Group members were encouraged to identify triggers, warning signs and coping skills used when facing the possibility of relapse. Social supports were discussed and explored in detail. Post Acute Withdrawal Syndrome (handout provided) was introduced and examined. Pt's were encouraged to ask questions, talk about key points associated with PAWS, and process this information in terms of relapse prevention.   Smart, Okey Zelek LCSWA  11/16/2014, 3:35 PM

## 2014-11-16 NOTE — Clinical Social Work Note (Signed)
Per pt request, CSW contacted her boss to inform him of her recent hospitalization and stated that she would be provided with letter at her discharge indicating admission and d/c dates.   [facility] referral sent today per pt request at 11/16/14 3:55PM   [facility]ather Smart, LCSWA 11/16/2014 3:58 PM

## 2014-11-16 NOTE — Tx Team (Signed)
Initial Interdisciplinary Treatment Plan   PATIENT STRESSORS: Financial difficulties Health problems Marital or family conflict Medication change or noncompliance Substance abuse   PATIENT STRENGTHS: Ability for insight Active sense of humor Communication skills Financial means General fund of knowledge Motivation for treatment/growth Work skills   PROBLEM LIST: Problem List/Patient Goals Date to be addressed Date deferred Reason deferred Estimated date of resolution  Suicide risk "I dont want to live anymore" 11/16/2014     "I need help I relapsed" 11/16/2014     Anxiety 11/16/2014     depression 11/16/2014                                    DISCHARGE CRITERIA:  Ability to meet basic life and health needs Adequate post-discharge living arrangements Improved stabilization in mood, thinking, and/or behavior Medical problems require only outpatient monitoring Need for constant or close observation no longer present Safe-care adequate arrangements made Withdrawal symptoms are absent or subacute and managed without 24-hour nursing intervention  PRELIMINARY DISCHARGE PLAN: Attend aftercare/continuing care group Attend 12-step recovery group Placement in alternative living arrangements Return to previous work or school arrangements  PATIENT/FAMIILY INVOLVEMENT: This treatment plan has been presented to and reviewed with the patient,Sandee Hollander, and/or family member.  The patient and family have been given the opportunity to ask questions and make suggestions.  Angeline SlimHill, Ashley M 11/16/2014, 6:29 AM

## 2014-11-16 NOTE — Progress Notes (Signed)
D Johnnette is experiencing alcohol withdrawal symptoms as evidenced by her behavior and her descriptions of how she says she feels. She has spent the majority of the day, thrasing from side -to-side, in her bed. She is disorganized and thought and speech. She jerks all over and says repeatedly " I don't feel right....something isn't right...please help me..I don't feel right. Given prn librium 25 mg po.    A NP notified . BP lying 111/61 and HRR 111. Speech tangential . She was given an additional libirum 25 per NP order. Bhavior about 40 min later, pt was calmer, able to have conversation on hte phone. Showered and dressed. States " I still don't feel right but things are a liitle better.". Body jerking not as pronounced and seems to have slowed down.   R Pt refused to answer questions on daily  Inventory but is able to contract for safety this evening. Detox cont and support healthy coping behaviors. 
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
## 2014-11-16 NOTE — Tx Team (Signed)
Interdisciplinary Treatment Plan Update (Adult)   Date: 11/16/2014   Time Reviewed: 9:30AM Progress in Treatment:  Attending groups: no-new to unit. Participating in groups:  No-new to unit.  Taking medication as prescribed: Yes  Tolerating medication: Yes  Family/Significant othe contact made: Not yet. SPE required for this pt.   Patient understands diagnosis: Yes, AEB seeking treatment for SI/depression/mood instability, crack cocaine abuse, and for medication stabilization.  Discussing patient identified problems/goals with staff: Yes  Medical problems stabilized or resolved: Yes  Denies suicidal/homicidal ideation: Yes during self report.  Patient has not harmed self or Others: Yes  New problem(s) identified:  Discharge Plan or Barriers: Pt requesting referral to [facility]. 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 CSW assessing for appropriate referrals.  Additional comments:  49 y/o female who presents voluntarily for substance abuse, SI, and depression. Patient states she is currently homeless and living in a shelter. 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 Patient states yesterday 11/15/2014 she spent all day in a crack house and used $700.00 worth of crack. Patient states she has also been drinking a 6 pack- a case of beer a day. Patient states, "Im tired and I want to get help." Patient states she is passive SI but verbally contracts for safety. Patient denies HI and denies AVH. Patient states he stressors are, her gf left her her in December, homelessness, drug use, and possible job loss. Patient is fidgety and states she is craving drugs during the admission process but states she want the help. Patient states she takes medications she gets from [facility] but states she has not had them in one month. Reason for Continuation of  Hospitalization: Depression/mood instability Medication management  Estimated length of stay: 3-5 days  For review of initial/current patient goals, please see plan of care.  Attendees:  Patient:    Family:    Physician: Dr. Thedore MinsMojeed Akintayo MD 11/16/2014 8:25 AM   Nursing: Alexia FreestonePatty RN; Rayfield Citizenaroline RN 11/16/2014 8:25 AM   Clinical Social Worker Sherin Murdoch Smart, LCSWA  11/16/2014 8:25 AM   Other: Earley AbideKristin D. LCSWA 11/16/2014 8:25 AM   Other: Darden DatesJennifer C. Nurse CM 11/16/2014 8:25 AM   Other: Liliane Badeolora Sutton, Community Care Coordinator  11/16/2014 8:25 AM   Other:    Scribe for Treatment Team:  The Sherwin-WilliamsHeather Smart LCSWA 11/16/2014 8:25 AM
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
## 2014-11-16 NOTE — ED Provider Notes (Signed)
CSN: [id number]     Arrival date & time 11/15/14  2248 History   First MD Initiated Contact with Patient 11/16/14 0001     Chief Complaint  Patient presents with  . Suicidal     (Consider location/radiation/quality/duration/timing/severity/associated sxs/prior Treatment) HPI  49 year old female presents requesting detox from alcohol and cocaine. She states "I don't want to live anymore". She's not have a plan for suicide. No homicidal ideation. Patient states that she was clean for 9 years and December relapsed on drugs and alcohol. Patient denies any other drug use. Drinks about 12 beers per day. She's been working but missed work yesterday because she was drunk. States no one knows she is having this problem but she couldn't deal with it anymore. She states it is ruining her life and causing her to not want to live anymore. No other issues such as chest pain, vomiting, or diarrhea.  Past Medical History  Diagnosis Date  . Depression   . Anxiety   . Carpal tunnel syndrome, bilateral   . GERD (gastroesophageal reflux disease)   . Neuropathy    Past Surgical History  Procedure Laterality Date  . Rotator cuff repair Right 2011  . Hammer toe fusion Bilateral 2008  . Carpal tunnel release Right 2003  . Cholecystectomy N/A 02/09/2014    Procedure: LAPAROSCOPIC CHOLECYSTECTOMY WITH INTRAOPERATIVE CHOLANGIOGRAM;  Surgeon: Valarie MerinoMatthew B Martin, MD;  Location: [facility];  Service: General;  Laterality: N/A;   Family History  Problem Relation Age of Onset  . Cancer Mother     pancreatic  . Heart failure Father   . Cancer Paternal Grandmother    History  Substance Use Topics  . Smoking status: Never Smoker   . Smokeless tobacco: Not on file  . Alcohol Use: No   OB History    No data available     Review of Systems  Constitutional: Negative for fever.  Respiratory: Negative for shortness of breath.   Cardiovascular: Negative for chest pain.  Gastrointestinal: Negative for vomiting and  diarrhea.  Psychiatric/Behavioral: Positive for suicidal ideas and dysphoric mood.  All other systems reviewed and are negative.     Allergies  Penicillins  Home Medications   Prior to Admission medications   Medication Sig Start Date End Date Taking? Authorizing Provider  ALPRAZolam Prudy Feeler(XANAX) 0.5 MG tablet Take 0.5 mg by mouth 2 (two) times daily.    Historical Provider, MD  atenolol (TENORMIN) 50 MG tablet Take 50 mg by mouth daily.    Historical Provider, MD  azithromycin (ZITHROMAX Z-PAK) 250 MG tablet 2 po day one, then 1 daily x 4 days 10/19/14   Geoffery Lyonsouglas Delo, MD  chlorpheniramine-HYDROcodone [facility](TUSSIONEX PENNKINETIC ER) 10-8 MG/5ML LQCR Take 5 mLs by mouth every 12 (twelve) hours as needed for cough. 10/19/14   Geoffery Lyonsouglas Delo, MD  clonazePAM Scarlette Calico(KLONOPIN) 0.5 MG tablet  03/05/14   Historical Provider, MD  cloNIDine (CATAPRES) 0.1 MG tablet Take 1 tablet (0.1 mg total) by mouth 3 (three) times daily. 05/25/13   Maurice MarchBarbara B McPherson, MD  gabapentin (NEURONTIN) 300 MG capsule Take 1 capsule in the morning and 2 capsules at bedtime. 07/25/13   Maurice MarchBarbara B McPherson, MD  gabapentin (NEURONTIN) 300 MG capsule Take 300 mg by mouth 3 (three) times daily.    Historical Provider, MD  hydrochlorothiazide (HYDRODIURIL) 25 MG tablet Take 1 tablet (25 mg total) by mouth daily. 05/25/13   Maurice MarchBarbara B McPherson, MD  hydrOXYzine (VISTARIL) 50 MG capsule Take 50 mg by mouth 2 (two)  times daily as needed for anxiety.    Historical Provider, MD  mirtazapine (REMERON) 30 MG tablet Take 30 mg by mouth at bedtime.    Historical Provider, MD  omeprazole (PRILOSEC) 20 MG capsule Take 40 mg by mouth daily.     Historical Provider, MD  OxyCODONE HCl ER (OXYCONTIN) 30 MG T12A Take 30 mg by mouth every 12 (twelve) hours.    Historical Provider, MD  sucralfate (CARAFATE) 1 G tablet Take 1 g by mouth 3 (three) times daily.     Historical Provider, MD  trazodone (DESYREL) 300 MG tablet Take 300 mg by mouth at bedtime.    Historical  Provider, MD   BP 155/92 mmHg  Pulse 110  Temp(Src) 98.5 F (36.9 C) (Oral)  Resp 22  Ht  (1.727 m)  Wt 180 lb (81.647 kg)  BMI 27.38 kg/m2  SpO2 98%  LMP 10/10/2014 Physical Exam  Constitutional: She is oriented to person, place, and time. She appears well-developed and well-nourished. No distress.  HENT:  Head: Normocephalic and atraumatic.  Right Ear: External ear normal.  Left Ear: External ear normal.  Nose: Nose normal.  Mouth/Throat: Oropharynx is clear and moist.  Cardiovascular: Normal rate, regular rhythm, normal heart sounds and intact distal pulses.   Pulmonary/Chest: Effort normal and breath sounds normal. No respiratory distress. She has no wheezes. She has no rales.  Abdominal: Soft. She exhibits no distension. There is no tenderness.  Musculoskeletal: She exhibits no edema.  Neurological: She is alert and oriented to person, place, and time.  Skin: Skin is warm and dry. She is not diaphoretic. No pallor.  Psychiatric: Her speech is normal. Thought content is not paranoid and not delusional. She exhibits a depressed mood. She expresses suicidal ideation. She expresses no homicidal ideation. She expresses no suicidal plans.  Tearful  Nursing note and vitals reviewed.   ED Course  Procedures (including critical care time) Labs Review Labs Reviewed  CBC - Abnormal; Notable for the following:    WBC 10.7 (*)    MCV 100.5 (*)    MCH 34.8 (*)    All other components within normal limits  COMPREHENSIVE METABOLIC PANEL - Abnormal; Notable for the following:    Potassium 3.3 (*)    Glucose, Bld 107 (*)    All other components within normal limits  ETHANOL - Abnormal; Notable for the following:    Alcohol, Ethyl (B) 136 (*)    All other components within normal limits  ACETAMINOPHEN LEVEL - Abnormal; Notable for the following:    Acetaminophen (Tylenol), Serum <10.0 (*)    All other components within normal limits  URINE RAPID DRUG SCREEN (HOSP PERFORMED) -  Abnormal; Notable for the following:    Cocaine POSITIVE (*)    All other components within normal limits  SALICYLATE LEVEL    Imaging Review No results found.   EKG Interpretation None      MDM   Final diagnoses:  Alcohol abuse  Cocaine abuse    Patient medically cleared. We'll consult TTS for both depression and detox of alcohol.    Pricilla Loveless, MD 11/16/14 [phone]

## 2014-11-16 NOTE — BH Assessment (Signed)
Tele Assessment Note   Amanda [name] is a 49 y.o. female who voluntarily presents to [facility] with SI/depression and SA.  Pt is requesting detox.  Pt reports the following: pt has been SI since December 2015, but no plan or intent to harm herself.  Pt says her thoughts are triggered by:(1) relapsing on alcohol and crack in December; (2) bad relationship and break up; (3) mother died 6 yrs ago from cancer; (4) brother died from AIDS 4 yrs ago; lives in a shelter; (5) possible job loss--didn't go to work yesterday because she getting high and (6) financial issues.  Pt says she drinks 1 case of beer, daily and her last drink was yesterday. She drank 1 case, she admits that she drinks while she is at work and before she goes to work.  Pt says she smokes 1/2 "8 ball" every day and smoked $400 worth of crack, yesterday.  Pt is c/o w/d sxs: stomach cramps, restless legs and headache and hasn't slept in 4 days.  Pt cannot contract for safety, stating "if I go back out on the streets I'll die, I just don't want to live anymore".  Pt is suspicious and asked this writer if the hospital was going to hurt her.    Axis I: Major depressive disorder, Recurrent episode, Severe; Cocaine use disorder, Severe;Alcohol use disorder, Severe  Axis II: Deferred Axis III:  Past Medical History  Diagnosis Date  . Depression   . Anxiety   . Carpal tunnel syndrome, bilateral   . GERD (gastroesophageal reflux disease)   . Neuropathy    Axis IV: economic problems, housing problems, occupational problems, other psychosocial or environmental problems, problems related to social environment and problems with primary support group Axis V: 31-40 impairment in reality testing  Past Medical History:  Past Medical History  Diagnosis Date  . Depression   . Anxiety   . Carpal tunnel syndrome, bilateral   . GERD (gastroesophageal reflux disease)   . Neuropathy     Past Surgical History  Procedure Laterality Date  . Rotator cuff  repair Right 2011  . Hammer toe fusion Bilateral 2008  . Carpal tunnel release Right 2003  . Cholecystectomy N/A 02/09/2014    Procedure: LAPAROSCOPIC CHOLECYSTECTOMY WITH INTRAOPERATIVE CHOLANGIOGRAM;  Surgeon: Valarie Merino, MD;  Location: [facility];  Service: General;  Laterality: N/A;    Family History:  Family History  Problem Relation Age of Onset  . Cancer Mother     pancreatic  . Heart failure Father   . Cancer Paternal Grandmother     Social History:  reports that she has never smoked. She does not have any smokeless tobacco history on file. She reports that she drinks alcohol. She reports that she uses illicit drugs ("Crack" cocaine).  Additional Social History:  Alcohol / Drug Use Pain Medications: See MAR  Prescriptions: See MAR  Over the Counter: See MAR  History of alcohol / drug use?: Yes Longest period of sobriety (when/how long): 9 yrs sobriety  Negative Consequences of Use: Work / Programmer, multimedia, Copywriter, advertising relationships, Surveyor, quantity Withdrawal Symptoms: Cramps, Other (Comment) (Anxiety, restless legs, headache ) Substance #1 Name of Substance 1: Alcohol  1 - Age of First Use: Teens  1 - Amount (size/oz): 1 Case  1 - Frequency: Daily  1 - Duration: On-going  1 - Last Use / Amount: 11/15/14 Substance #2 Name of Substance 2: Crack Cocaine  2 - Age of First Use: 20's  2 - Amount (size/oz): 1/2 "8ball"  2 - Frequency: Daily  2 - Duration: On-going  2 - Last Use / Amount: 11/15/14  CIWA: CIWA-Ar BP: 155/92 mmHg Pulse Rate: 110 Nausea and Vomiting: no nausea and no vomiting Tactile Disturbances: none Tremor: no tremor Auditory Disturbances: not present Paroxysmal Sweats: no sweat visible Visual Disturbances: not present Anxiety: mildly anxious Headache, Fullness in Head: moderate Agitation: normal activity Orientation and Clouding of Sensorium: oriented and can do serial additions CIWA-Ar Total: 4 COWS:    PATIENT STRENGTHS: (choose at least two) Communication  skills Motivation for treatment/growth  Allergies:  Allergies  Allergen Reactions  . Penicillins     Throat swelling    Home Medications:  (Not in a hospital admission)  OB/GYN Status:  Patient's last menstrual period was 10/10/2014.  General Assessment Data Location of Assessment: [facility] ED Is this a Tele or Face-to-Face Assessment?: Tele Assessment Is this an Initial Assessment or a Re-assessment for this encounter?: Initial Assessment Living Arrangements: Alone Can pt return to current living arrangement?: Yes Admission Status: Voluntary Is patient capable of signing voluntary admission?: Yes Transfer from: Home Referral Source: Self/Family/Friend  Medical Screening Exam [facility] Walk-in ONLY) Medical Exam completed: No Reason for MSE not completed: Other: (None )  [facility] Crisis Care Plan Living Arrangements: Alone  Education Status Is patient currently in school?: No Current Grade: None  Highest grade of school patient has completed: None  Name of school: None  Contact person: None   Risk to self with the past 6 months Is patient at risk for suicide?: Yes What has been your use of drugs/alcohol within the last 12 months?: Abusing;alcohol, crack  Other Self Harm Risks: None  Triggers for Past Attempts: None known Intentional Self Injurious Behavior: None Family Suicide History: No Persecutory voices/beliefs?: No Substance abuse history and/or treatment for substance abuse?: Yes Suicide prevention information given to non-admitted patients: Not applicable  Risk to Others within the past 6 months Homicidal Ideation: No Thoughts of Harm to Others: No Current Homicidal Intent: No Current Homicidal Plan: No Access to Homicidal Means: No Identified Victim: None  History of harm to others?: No Assessment of Violence: None Noted Violent Behavior Description: None  Does patient have access to weapons?: No Criminal Charges Pending?: No Does patient have a court date:  No  Psychosis Hallucinations: None noted Delusions: None noted  Mental Status Report Appearance/Hygiene: Disheveled, In scrubs Eye Contact: Good Motor Activity: Restlessness, Agitation Speech: Logical/coherent, Soft, Slurred Level of Consciousness: Alert, Restless Mood: Depressed, Anxious, Suspicious, Sad, Ashamed/humiliated Affect: Depressed, Sad, Flat, Appropriate to circumstance Anxiety Level: Moderate Thought Processes: Coherent, Relevant Judgement: Impaired Orientation: Person, Place, Time, Situation Obsessive Compulsive Thoughts/Behaviors: None  Cognitive Functioning Concentration: Normal Memory: Recent Intact, Remote Intact IQ: Average Insight: Poor Impulse Control: Fair Appetite: Fair Weight Loss: 0 Weight Gain: 0 Sleep: Decreased Total Hours of Sleep:  (No sleep x4 days ) Vegetative Symptoms: None  ADLScreening [facility]) Patient's cognitive ability adequate to safely complete daily activities?: Yes Patient able to express need for assistance with ADLs?: Yes Independently performs ADLs?: Yes (appropriate for developmental age)  Prior Inpatient Therapy Prior Inpatient Therapy: Yes Prior Therapy Dates: 6 yrs ago  Prior Therapy Facilty/Provider(s): [place]  Reason for Treatment: Depresison/SI   Prior Outpatient Therapy Prior Outpatient Therapy: No Prior Therapy Dates: None  Prior Therapy Facilty/Provider(s): None  Reason for Treatment: None   ADL Screening (condition at time of admission) Patient's cognitive ability adequate to safely complete daily activities?: Yes Is the patient deaf or have  difficulty hearing?: No Does the patient have difficulty seeing, even when wearing glasses/contacts?: No Does the patient have difficulty concentrating, remembering, or making decisions?: No Patient able to express need for assistance with ADLs?: Yes Does the patient have difficulty dressing or bathing?: No Independently performs ADLs?: Yes  (appropriate for developmental age) Does the patient have difficulty walking or climbing stairs?: No Weakness of Legs: None Weakness of Arms/Hands: None  Home Assistive Devices/Equipment Home Assistive Devices/Equipment: None  Therapy Consults (therapy consults require a physician order) PT Evaluation Needed: No OT Evalulation Needed: No SLP Evaluation Needed: No Abuse/Neglect Assessment (Assessment to be complete while patient is alone) Physical Abuse: Denies Verbal Abuse: Denies Sexual Abuse: Denies Exploitation of patient/patient's resources: Denies Self-Neglect: Denies Values / Beliefs Cultural Requests During Hospitalization: None Spiritual Requests During Hospitalization: None Consults Spiritual Care Consult Needed: No Social Work Consult Needed: No Merchant navy officerAdvance Directives (For Healthcare) Does patient have an advance directive?: No Would patient like information on creating an advanced directive?: No - patient declined information    Additional Information 1:1 In Past 12 Months?: No CIRT Risk: No Elopement Risk: No Does patient have medical clearance?: Yes     Disposition:  Disposition Initial Assessment Completed for this Encounter: Yes Disposition of Patient: Inpatient treatment program, Referred to [facility](Ijeoma Nwaeze, NP recommends inpt admission; 302-2) Type of inpatient treatment program: Adult [facility](Ijeoma Nwaeze, NP recommends inpt admission; 302-2) Patient referred to: Other (Comment) [facility](Ijeoma Nwaeze, NP recommends inpt admissions; 302-2)  Murrell ReddenSimmons, Lorenza Winkleman C 11/16/2014 2:41 AM

## 2014-11-16 NOTE — Progress Notes (Signed)
Recreation Therapy Notes  Date: 03.25.2016 Time: 9:30am Location: [address] Group Room   Group Topic: Stress Management  Goal Area(s) Addresses:  Patient will actively participate in stress management techniques presented during session.   Behavioral Response: Did not attend.    Marykay Lexenise L Danthony Kendrix, LRT/CTRS  Devyon Keator L 11/16/2014 10:27 AM

## 2014-11-16 NOTE — [facility] Group Notes (Signed)
[facility]   11/16/2014 11:36 AM  Participation Quality:  Invited-DID NOT ATTEND-pt in room resting.   Smart, American FinancialHeather LCSWA

## 2014-11-16 NOTE — Progress Notes (Signed)
Patient ID: Warden FillersDonna A Pepitone, female   DOB: Jun 19, 1966, 49 y.o.   MRN: [id number]  49 y/o female who presents voluntarily for substance abuse, SI, and depression.  Patient states she is currently homeless and living in a shelter.  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  Patient states yesterday 11/15/2014 she spent all day in a crack house and used $700.00 worth of crack.  Patient states she has also been drinking a 6 pack- a case of beer a day.  Patient states, "Im tired and I want to get help."  Patient states she is passive SI but verbally contracts for safety.  Patient denies HI and denies AVH.  Patient states he stressors are, her gf left her her in December, homelessness, drug use, and possible job loss.  Patient is fidgety and states she is craving drugs during the admission process but states she want the help.  Patient states she takes medications she gets from [facility] but states she has not had them in one month.  Consents obtained, fall safety plan explained and patient verbalized understanding.   Belongings secured in locker #4.  Food and fluids offered and patient accepted fluids. Patient escorted and oriented to the unit.  Patient offered no additional questions or concerns.
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
## 2014-11-17 ENCOUNTER — Encounter (HOSPITAL_COMMUNITY): Payer: Self-pay | Admitting: Physician Assistant

## 2014-11-17 DIAGNOSIS — F1420 Cocaine dependence, uncomplicated: Secondary | ICD-10-CM

## 2014-11-17 DIAGNOSIS — F1020 Alcohol dependence, uncomplicated: Secondary | ICD-10-CM

## 2014-11-17 LAB — BASIC METABOLIC PANEL
Anion gap: 8 (ref 5–15)
BUN: 14 mg/dL (ref 6–23)
CO2: 25 mmol/L (ref 19–32)
Calcium: 8.5 mg/dL (ref 8.4–10.5)
Chloride: 106 mmol/L (ref 96–112)
Creatinine, Ser: 0.76 mg/dL (ref 0.50–1.10)
GFR calc Af Amer: >90 mL/min (ref >90)
GFR calc non Af Amer: >90 mL/min (ref >90)
Glucose, Bld: 99 mg/dL (ref 70–99)
Potassium: 4.1 mmol/L (ref 3.5–5.1)
Sodium: 139 mmol/L (ref 135–145)

## 2014-11-17 MED ORDER — CYCLOBENZAPRINE HCL 10 MG PO TABS
5.0000 mg | ORAL_TABLET | Freq: Three times a day (TID) | ORAL | Status: DC | PRN
  Administered 2014-11-17 – 2014-11-20 (×9): 5 mg via ORAL
  Filled 2014-11-17 (×8): qty 1
  Filled 2014-11-17: qty 4
  Filled 2014-11-17: qty 1

## 2014-11-17 MED ORDER — FAMOTIDINE 20 MG PO TABS
20.0000 mg | ORAL_TABLET | Freq: Two times a day (BID) | ORAL | Status: DC
  Administered 2014-11-17 – 2014-11-19 (×4): 20 mg via ORAL
  Filled 2014-11-17 (×9): qty 1

## 2014-11-17 NOTE — Clinical Social Work Note (Signed)
At patient's request, CSW provided clothing to patient from the clothing closet, including 2 shirts and a pair of shorts.  She stated she did not want her friends to come visit her and find her in scrubs.  Additionally, with pt's signed consent and with her present on phone call, we spoke with her employer to tell him she was in the hospital.  He was irritable, stating she should be calling him herself rather than 2 days in a row calling with a Child psychotherapistsocial worker.  CSW did not know this had been done yesterday.  She asked him if she is going to lose her job, and he stated, "You shouldn't have a job anymore, but you do have a job."  She agreed to call him back on her own later.  CSW emphasized to her that she now has an obligation to do so.  She was defensive and upset, saying that he was not a nice person, that he was saying she had called out multiple times, and it has only happened once.  We processed the reality of having to do what a boss says.  Sarina SerGrossman-Orr, Henley Blyth Jo 11/17/2014 5:16 PM

## 2014-11-17 NOTE — Plan of Care (Signed)
Problem: Alteration in mood & ability to function due to Goal: STG-Patient will report withdrawal symptoms Outcome: Progressing Pt reported withdrawal symptoms of anxiety, sweating, "twitching," "jerking," and agitation.   Goal: STG-Patient will attend groups Outcome: Progressing Pt attended evening group on 11/16/14.  Problem: Diagnosis: Increased Risk For Suicide Attempt Goal: STG-Patient Will Comply With Medication Regime Outcome: Progressing Pt has been compliant with all medications this shift.

## 2014-11-17 NOTE — Progress Notes (Signed)
D: Pt has depressed affect and mood.  Pt reports "I have this jerking in my body since from drinking and drugging everyday."  Pt reports this began in December.  Pt has abnormal body movements which she describes as "twitching" or "jerking."  When describing her day, pt stated "it wasn't good."  Pt reports she is starting to feel a little better.  Pt reports "I don't want to live anymore."  Pt denies having a plan and verbally contracts for safety.  Pt's friend came and visited her.  Pt gave her visitor the keys and a Master[employer] debit card from her locker.  This was witnessed by MHT and Clinical research associatewriter.  MHT, Clinical research associatewriter, and pt signed inventory sheet indicating this.  Pt denies HI, denies hallucinations.  Pt reported further withdrawal symptoms of anxiety, agitation, and sweats.  Pt attended evening group.   A: Introduced self to pt and pt's visitor.  Educated pt on medications and provided pt with Librium education handout.  Medication administered per order.  PRN Librium administered for CIWA of 11, see flow sheet.  PRN medication administered for pain and diarrhea, see flow sheet.  Provided support and encouragement.  Actively listened to pt.  Provided pt with pitcher of Gatorade and encouraged PO fluid intake.  Provided pt with heat pack for pain.   R: Pt verbally contracts for safety and reports that she will notify staff of needs and concerns.  She is compliant with medications.  Will continue to monitor and assess.

## 2014-11-17 NOTE — Progress Notes (Signed)
[facility] MD Progress Note  11/17/2014 2:49 PM Amanda [name]  MRN:  [id number] Subjective:  Patient notes that she slept good last night. She notes that she is hungry and is eating snacks currently. She has been up and active on the unit. She states she is doing the best she can, and doesn't want to live any more.  She also is making a grunting noise in the back of her throat, and doesn 't know why. She states she is safe here on the unit.    She states she is bipolar and has used Mirtazepine and vistaril in the past. Objective: She is up and active on the unit. Oriented 2/3. She denies SI/HI, she denies AVH.    Principal Problem: Major depressive disorder, recurrent episode, severe Diagnosis:   Patient Active Problem List   Diagnosis Date Noted  . Alcohol use disorder, severe, dependence [F10.20] 11/16/2014  . Major depressive disorder, recurrent episode, severe [F33.2] 11/16/2014    Class: Chronic  . Cocaine use disorder, severe, dependence [F14.20] 11/16/2014  . Acute calculous cholecystitis s/p lap chole 02/09/2014 [K80.00] 02/09/2014  . HTN (hypertension) [I10] 07/25/2013  . Pain in vertebral column [M54.9] 05/05/2013  . Carpal tunnel syndrome [G56.00] 11/18/2012  . Numbness and tingling of both legs below knees [R20.0] 11/18/2012  . Lumbago [M54.5] 11/18/2012  . Anxiety state, unspecified [F41.1] 11/18/2012   Total Time spent with patient: 20 minutes   Past Medical History:  Past Medical History  Diagnosis Date  . Depression   . Anxiety   . Carpal tunnel syndrome, bilateral   . GERD (gastroesophageal reflux disease)   . Neuropathy   . Hypertension   . Asthma   . PUD (peptic ulcer disease)     Past Surgical History  Procedure Laterality Date  . Rotator cuff repair Right 2011  . Hammer toe fusion Bilateral 2008  . Carpal tunnel release Right 2003  . Cholecystectomy N/A 02/09/2014    Procedure: LAPAROSCOPIC CHOLECYSTECTOMY WITH INTRAOPERATIVE CHOLANGIOGRAM;  Surgeon: Pedro Earls, MD;  Location: [facility];  Service: General;  Laterality: N/A;   Family History:  Family History  Problem Relation Age of Onset  . Cancer Mother     pancreatic  . Heart failure Father   . Cancer Paternal Grandmother   . HIV/AIDS Brother    Social History:  History  Alcohol Use  . Yes    Comment: 6 pack to a case of beer a day     History  Drug Use  . Yes  . Special: "Crack" cocaine    History   Social History  . Marital Status: Single    Spouse Name: N/A  . Number of Children: N/A  . Years of Education: N/A   Occupational History  . [employer]   Social History Main Topics  . Smoking status: Never Smoker   . Smokeless tobacco: Not on file  . Alcohol Use: Yes     Comment: 6 pack to a case of beer a day  . Drug Use: Yes    Special: "Crack" cocaine  . Sexual Activity: No   Other Topics Concern  . None   Social History Narrative   Pt lives at home alone. She has a HS education level and does not have any children.    She drinks 2 cups of caffeine daily.   Additional History:    Sleep: Good  Appetite:  Fair   Assessment:   Musculoskeletal: Strength &  Muscle Tone: within normal limits Gait & Station: normal Patient leans: N/A   Psychiatric Specialty Exam: Physical Exam  ROS  Blood pressure 101/55, pulse 55, temperature 97.7 F (36.5 C), temperature source Oral, resp. rate 20, last menstrual period 10/10/2014, SpO2 99 %.There is no weight on file to calculate BMI.  General Appearance: Disheveled  Eye Sport and exercise psychologist::  Fair  Speech:  Clear and Coherent  Volume:  Normal  Mood:  Anxious and Dysphoric  Affect:  Non-Congruent  Thought Process:  Circumstantial  Orientation:  Other:  2/3  Thought Content:  WDL  Suicidal Thoughts:  No  Homicidal Thoughts:  No  Memory:  Negative  Judgement:  Poor  Insight:  Present  Psychomotor Activity:  Increased and Mannerisms  Concentration:  Poor  Recall:  NA  Fund of Knowledge:Fair  Language: Good   Akathisia:  NA  Handed:  Right  AIMS (if indicated):     Assets:  Communication Skills Desire for Improvement Financial Resources/Insurance Housing Physical Health Social Support Vocational/Educational  ADL's:  Impaired  Cognition: WNL and Impaired,  Mild  Sleep:  Number of Hours: 6.75     Current Medications: Current Facility-Administered Medications  Medication Dose Route Frequency Provider Last Rate Last Dose  . acetaminophen (TYLENOL) tablet 650 mg  650 mg Oral Q6H PRN Harriet Butte, NP   650 mg at 11/16/14 2151  . alum & mag hydroxide-simeth (MAALOX/MYLANTA) 200-200-20 MG/5ML suspension 30 mL  30 mL Oral Q4H PRN Harriet Butte, NP      . chlordiazePOXIDE (LIBRIUM) capsule 25 mg  25 mg Oral Q6H PRN Encarnacion Slates, NP   25 mg at 11/16/14 2018  . chlordiazePOXIDE (LIBRIUM) capsule 25 mg  25 mg Oral TID Encarnacion Slates, NP       Followed by  . [START ON 11/18/2014] chlordiazePOXIDE (LIBRIUM) capsule 25 mg  25 mg Oral BH-qamhs Encarnacion Slates, NP       Followed by  . [START ON 11/20/2014] chlordiazePOXIDE (LIBRIUM) capsule 25 mg  25 mg Oral Daily Encarnacion Slates, NP      . cyclobenzaprine (FLEXERIL) tablet 5 mg  5 mg Oral TID PRN Ruben Im, PA-C   5 mg at 11/17/14 1050  . feeding supplement (ENSURE ENLIVE) (ENSURE ENLIVE) liquid 237 mL  237 mL Oral BID BM Nicholaus Bloom, MD   237 mL at 11/17/14 [phone]  . gabapentin (NEURONTIN) capsule 300 mg  300 mg Oral Q breakfast Nicholaus Bloom, MD   300 mg at 11/17/14 0830  . gabapentin (NEURONTIN) capsule 600 mg  600 mg Oral QHS Nicholaus Bloom, MD   600 mg at 11/16/14 2117  . hydrOXYzine (ATARAX/VISTARIL) tablet 25 mg  25 mg Oral Q6H PRN Encarnacion Slates, NP   25 mg at 11/17/14 0831  . lisinopril (PRINIVIL,ZESTRIL) tablet 10 mg  10 mg Oral Daily Encarnacion Slates, NP   10 mg at 11/17/14 0829  . loperamide (IMODIUM) capsule 2-4 mg  2-4 mg Oral PRN Encarnacion Slates, NP   4 mg at 11/16/14 2117  . magnesium hydroxide (MILK OF MAGNESIA) suspension 30 mL  30 mL  Oral Daily PRN Harriet Butte, NP      . mirtazapine (REMERON) tablet 15 mg  15 mg Oral QHS Mojeed Akintayo   15 mg at 11/16/14 2117  . multivitamin with minerals tablet 1 tablet  1 tablet Oral Daily Encarnacion Slates, NP   1 tablet at 11/17/14 0830  .  nicotine (NICODERM CQ - dosed in mg/24 hours) patch 21 mg  21 mg Transdermal Daily Mojeed Akintayo   21 mg at 11/17/14 0831  . ondansetron (ZOFRAN-ODT) disintegrating tablet 4 mg  4 mg Oral Q6H PRN Encarnacion Slates, NP   4 mg at 11/16/14 1651  . potassium chloride (KLOR-CON) packet 20 mEq  20 mEq Oral Once Mojeed Akintayo   20 mEq at 11/16/14 1700  . sucralfate (CARAFATE) tablet 1 g  1 g Oral TID Harriet Butte, NP   1 g at 11/17/14 1303  . thiamine (VITAMIN B-1) tablet 100 mg  100 mg Oral Daily Encarnacion Slates, NP   100 mg at 11/17/14 0829  . traZODone (DESYREL) tablet 300 mg  300 mg Oral QHS Harriet Butte, NP   300 mg at 11/16/14 2117    Lab Results:  Results for orders placed or performed during the hospital encounter of 11/16/14 (from the past 48 hour(s))  Basic metabolic panel     Status: None   Collection Time: 11/17/14  6:30 AM  Result Value Ref Range   Sodium 139 135 - 145 mmol/L   Potassium 4.1 3.5 - 5.1 mmol/L   Chloride 106 96 - 112 mmol/L   CO2 25 19 - 32 mmol/L   Glucose, Bld 99 70 - 99 mg/dL   BUN 14 6 - 23 mg/dL   Creatinine, Ser 0.76 0.50 - 1.10 mg/dL   Calcium 8.5 8.4 - 10.5 mg/dL   GFR calc non Af Amer >90 >90 mL/min   GFR calc Af Amer >90 >90 mL/min    Comment: (NOTE) The eGFR has been calculated using the CKD EPI equation. This calculation has not been validated in all clinical situations. eGFR's persistently <90 mL/min signify possible Chronic Kidney Disease.    Anion gap 8 5 - 15    Comment: Performed at [facility]    Physical Findings: AIMS: Facial and Oral Movements Muscles of Facial Expression: None, normal Lips and Perioral Area: None, normal Jaw: None, normal Tongue: None,  normal,Extremity Movements Upper (arms, wrists, hands, fingers): None, normal Lower (legs, knees, ankles, toes): None, normal, Trunk Movements Neck, shoulders, hips: None, normal, Overall Severity Severity of abnormal movements (highest score from questions above): None, normal Incapacitation due to abnormal movements: None, normal, Dental Status Current problems with teeth and/or dentures?: No Does patient usually wear dentures?: No  CIWA:  CIWA-Ar Total: 5 COWS:     Treatment Plan Summary: 1. Continue crisis management and stabilization. 2. Medication management to reduce current symptoms to base line and improve patient's overall level of functioning 3. Treat health problems as indicated. 4. Develop treatment plan to decrease risk of relapse upon discharge and the need for readmission. 5. Psycho-social education regarding relapse prevention and self care. 6. Health care follow up as needed for medical problems. 7. Continue home medications where appropriate. 8. Will Add Pepcid [facility]     Medical Decision Making:  Established Problem, Stable/Improving (1)    Milta Deiters T. Mashburn RPAC 2:58 PM 11/17/2014   Agree with Progress Note as above

## 2014-11-17 NOTE — Progress Notes (Signed)
Patient did not attend the evening speaker AA meeting. Pt was notified that group was beginning but remained in bed.   

## 2014-11-17 NOTE — [facility] Group Notes (Signed)
[facility] Group Notes:  (Clinical Social Work)  11/17/2014     2:30-3:30PM  Summary of Progress/Problems:   The main focus of today's process group was to learn how to use a decisional balance exercise to move forward in the Stages of Change, which were described and discussed.  Motivational Interviewing and a worksheet were utilized to help patients explore in depth the perceived benefits and costs of a self-sabotaging behavior, as well as the  benefits and costs of replacing that with a healthy coping mechanism.   The patient expressed a number of random thoughts about her drug use.  She appeared convinced that nobody in her life, especially at work, knew she was using even though she was "sneaking" 6-packs of beer and even her crack pipe in to the job.    Type of Therapy:  Group Therapy - Process   Participation Level:  Active  Participation Quality:  Attentive and Sharing  Affect:  Blunted  Cognitive:  Disorganized  Insight:  Limited  Engagement in Therapy:  Improving  Modes of Intervention:  Education, Motivational Interviewing  Ambrose MantleMareida Grossman-Orr, LCSW 11/17/2014, 4:43 PM

## 2014-11-17 NOTE — [facility] Group Notes (Signed)
[facility] Group Notes:  (Nursing/MHT/Case Management/Adjunct)  Date:  11/17/2014  Time:  0900am  Type of Therapy:  Nurse Education  Participation Level:  Minimal  Participation Quality:  Pt left group early  Affect:  Depressed  Cognitive:  Alert  Insight:  None  Engagement in Group:  Pt left group early  Modes of Intervention:  Discussion, Education and Support  Summary of Progress/Problems: Patient attended group but did not stay d/t feeling unwell d/t withdrawal symptoms.  Lendell CapriceGuthrie, Keerthana Vanrossum A 11/17/2014, 11:25 AM

## 2014-11-17 NOTE — Progress Notes (Signed)
D   Pt is irritable and isolates to her room   She is restless and fidgety in bed and complains of some stomach cramps   And said it feels like she has a hole in her stomach   She siad she didn't feel safe but also agreed she was in a safe place and she did not feel like hurting herself A   Verbal support given   Medications administered and effectiveness monitored   Q 15 min checks   Discussed safety issues with pt and discussed signs and symptoms of withdrawal  R   Pt safe at present

## 2014-11-17 NOTE — Progress Notes (Signed)
D: Patient is alert and oriented X3, pt keeps asking what day of the week it is and believes today is "friday". Pt's mood and affect is depressed, sad, blunted, and anxious. Pt's speech is tangential and disorganized at times, pt is heard grunting repeatedly and states "what's causing me to do this, this is not normal." Pt is figdety. Pt reports passive SI stating "I just don't want to live." Pt denies HI and AVH. Pt complains of muscles spasms, and generalized pain, 8/10. Pt rates depression 8/10 and anxiety 7/10. Pt attempted to attend morning RN group but was unable to stay due to feeling unwell. Pt states "I don't fee like I'm worth your time." Pt reports being sober for 9 months prior to current relapse, pt expresses low self/worth and low self esteem d/t relapse. Pt expresses concerns about losing her job d/t "no show" this week." Pt experiencing hypotension today (See docflowsheet-vitals). Pt requesting to speak with LCSW 1:1. A: Encouragement/Support provided to pt. Active listening by RN. PA, Lloyd Hugereil made aware of pt's complaints. Pt encouraged to increase fluids, fluids given, will reassess BP frequently. LCSW, Phillips HayMareida made aware of pt's request. PRN medication administered for withdrawal, anxiety, and muscle spasms per providers orders (See MAR). Scheduled medications administered per providers orders (See MAR). 15 minute checks continued per protocol for patient safety.  R: Pt verbally contracts for safety, agrees not to harm self, and to come to staff with increased intensity of suicidal thoughts. Patient cooperative and receptive to nursing interventions. Pt remains safe.

## 2014-11-17 NOTE — Plan of Care (Signed)
Problem: Ineffective individual coping Goal: STG: Patient will remain free from self harm Outcome: Progressing Patient remains free from self harm. 15 minute checks continued per protocol for patient safety.   Problem: Alteration in mood & ability to function due to Goal: LTG-Pt reports reduction in suicidal thoughts (Patient reports reduction in suicidal thoughts and is able to verbalize a safety plan for whenever patient is feeling suicidal)  Outcome: Not Progressing Patient continues to endorse having passive suicidal thoughts today. Pt is able to verbally contract for safety and does come to RN to consult regarding suicidal thoughts and low self esteem. Encouragement/Support provided to pt.  Goal: STG-Patient will attend groups Outcome: Not Progressing Patient unable to attend unit groups today d/t heavy withdrawal symptoms.  Problem: Diagnosis: Increased Risk For Suicide Attempt Goal: STG-Patient Will Comply With Medication Regime Outcome: Progressing Patient has adhered to medication regimen today with ease.

## 2014-11-18 DIAGNOSIS — F332 Major depressive disorder, recurrent severe without psychotic features: Secondary | ICD-10-CM

## 2014-11-18 MED ORDER — PANTOPRAZOLE SODIUM 40 MG PO TBEC
80.0000 mg | DELAYED_RELEASE_TABLET | Freq: Every day | ORAL | Status: DC
  Administered 2014-11-18 – 2014-11-21 (×4): 80 mg via ORAL
  Filled 2014-11-18 (×6): qty 2

## 2014-11-18 NOTE — Progress Notes (Signed)
Patient ID: Amanda [name], female   DOB: 1965-09-07, 49 y.o.   MRN: [id number] [facility] MD Progress Note  11/18/2014 12:43 PM Amanda [name]  MRN:  [id number] Subjective:  Patient is up and voicing that she has abdominal pain. She had an episode of vomiting, and was given zofran and went to sleep.  She continues to make the grunting noises and states that this is annoying to her. She states that she can't remember things and is very focused on her guilt and how serious her relapse is. She is focused on her medication and seeking medication for her stomach discomfort. Objective: Patient is up and active on the unit. She is in the day room. Tells me she spoke with her employer and there will be some consequences for her at work, but she does feel better about this over all. She is encouraged to ask for support medications for symptoms of her nurse. Her asthma medication will be retrieved from her locker as she can not remember the name of it. Principal Problem: Major depressive disorder, recurrent episode, severe Diagnosis:   Patient Active Problem List   Diagnosis Date Noted  . Alcohol use disorder, severe, dependence [F10.20] 11/16/2014  . Major depressive disorder, recurrent episode, severe [F33.2] 11/16/2014    Class: Chronic  . Cocaine use disorder, severe, dependence [F14.20] 11/16/2014  . Acute calculous cholecystitis s/p lap chole 02/09/2014 [K80.00] 02/09/2014  . HTN (hypertension) [I10] 07/25/2013  . Pain in vertebral column [M54.9] 05/05/2013  . Carpal tunnel syndrome [G56.00] 11/18/2012  . Numbness and tingling of both legs below knees [R20.0] 11/18/2012  . Lumbago [M54.5] 11/18/2012  . Anxiety state, unspecified [F41.1] 11/18/2012   Total Time spent with patient: 20 minutes   Past Medical History:  Past Medical History  Diagnosis Date  . Depression   . Anxiety   . Carpal tunnel syndrome, bilateral   . GERD (gastroesophageal reflux disease)   . Neuropathy   . Hypertension   .  Asthma   . PUD (peptic ulcer disease)     Past Surgical History  Procedure Laterality Date  . Rotator cuff repair Right 2011  . Hammer toe fusion Bilateral 2008  . Carpal tunnel release Right 2003  . Cholecystectomy N/A 02/09/2014    Procedure: LAPAROSCOPIC CHOLECYSTECTOMY WITH INTRAOPERATIVE CHOLANGIOGRAM;  Surgeon: Pedro Earls, MD;  Location: [facility];  Service: General;  Laterality: N/A;   Family History:  Family History  Problem Relation Age of Onset  . Cancer Mother     pancreatic  . Heart failure Father   . Cancer Paternal Grandmother   . HIV/AIDS Brother    Social History:  History  Alcohol Use  . Yes    Comment: 6 pack to a case of beer a day     History  Drug Use  . Yes  . Special: "Crack" cocaine    History   Social History  . Marital Status: Single    Spouse Name: N/A  . Number of Children: N/A  . Years of Education: N/A   Occupational History  . [employer]   Social History Main Topics  . Smoking status: Never Smoker   . Smokeless tobacco: Not on file  . Alcohol Use: Yes     Comment: 6 pack to a case of beer a day  . Drug Use: Yes    Special: "Crack" cocaine  . Sexual Activity: No   Other Topics Concern  . None   Social  History Narrative   Pt lives at home alone. She has a HS education level and does not have any children.    She drinks 2 cups of caffeine daily.   Additional History:    Sleep: Good  Appetite:  Fair   Assessment:   Musculoskeletal: Strength & Muscle Tone: within normal limits Gait & Station: normal Patient leans: N/A   Psychiatric Specialty Exam: Physical Exam  ROS  Blood pressure 107/79, pulse 100, temperature 97.8 F (36.6 C), temperature source Oral, resp. rate 19, last menstrual period 10/10/2014, SpO2 99 %.There is no weight on file to calculate BMI.  General Appearance: Disheveled  Eye Sport and exercise psychologist::  Fair  Speech:  Clear and Coherent  Volume:  Normal  Mood:  Anxious and Dysphoric  Affect:   Non-Congruent  Thought Process:  Circumstantial  Orientation:  Other:  2/3  Thought Content:  WDL  Suicidal Thoughts:  No  Homicidal Thoughts:  No  Memory:  Negative  Judgement:  Poor  Insight:  Present  Psychomotor Activity:  Increased and Mannerisms  Concentration:  Poor  Recall:  NA  Fund of Knowledge:Fair  Language: Good  Akathisia:  NA  Handed:  Right  AIMS (if indicated):     Assets:  Communication Skills Desire for Improvement Financial Resources/Insurance Housing Physical Health Social Support Vocational/Educational  ADL's:  Impaired  Cognition: WNL and Impaired,  Mild  Sleep:  Number of Hours: 6.75     Current Medications: Current Facility-Administered Medications  Medication Dose Route Frequency Provider Last Rate Last Dose  . acetaminophen (TYLENOL) tablet 650 mg  650 mg Oral Q6H PRN Harriet Butte, NP   650 mg at 11/16/14 2151  . alum & mag hydroxide-simeth (MAALOX/MYLANTA) 200-200-20 MG/5ML suspension 30 mL  30 mL Oral Q4H PRN Harriet Butte, NP      . chlordiazePOXIDE (LIBRIUM) capsule 25 mg  25 mg Oral Q6H PRN Encarnacion Slates, NP   25 mg at 11/16/14 2018  . chlordiazePOXIDE (LIBRIUM) capsule 25 mg  25 mg Oral BH-qamhs Encarnacion Slates, NP       Followed by  . [START ON 11/20/2014] chlordiazePOXIDE (LIBRIUM) capsule 25 mg  25 mg Oral Daily Encarnacion Slates, NP      . cyclobenzaprine (FLEXERIL) tablet 5 mg  5 mg Oral TID PRN Ruben Im, PA-C   5 mg at 11/17/14 2022  . famotidine (PEPCID) tablet 20 mg  20 mg Oral BID Ruben Im, PA-C   20 mg at 11/18/14 0810  . feeding supplement (ENSURE ENLIVE) (ENSURE ENLIVE) liquid 237 mL  237 mL Oral BID BM Nicholaus Bloom, MD   237 mL at 11/17/14 1538  . gabapentin (NEURONTIN) capsule 300 mg  300 mg Oral Q breakfast Nicholaus Bloom, MD   300 mg at 11/18/14 0810  . gabapentin (NEURONTIN) capsule 600 mg  600 mg Oral QHS Nicholaus Bloom, MD   600 mg at 11/17/14 2022  . hydrOXYzine (ATARAX/VISTARIL) tablet 25 mg  25 mg Oral Q6H  PRN Encarnacion Slates, NP   25 mg at 11/17/14 0831  . lisinopril (PRINIVIL,ZESTRIL) tablet 10 mg  10 mg Oral Daily Encarnacion Slates, NP   10 mg at 11/18/14 0810  . loperamide (IMODIUM) capsule 2-4 mg  2-4 mg Oral PRN Encarnacion Slates, NP   4 mg at 11/16/14 2117  . magnesium hydroxide (MILK OF MAGNESIA) suspension 30 mL  30 mL Oral Daily PRN Harriet Butte, NP      .  mirtazapine (REMERON) tablet 15 mg  15 mg Oral QHS Mojeed Akintayo   15 mg at 11/17/14 2023  . multivitamin with minerals tablet 1 tablet  1 tablet Oral Daily Encarnacion Slates, NP   1 tablet at 11/18/14 0810  . nicotine (NICODERM CQ - dosed in mg/24 hours) patch 21 mg  21 mg Transdermal Daily Mojeed Akintayo   21 mg at 11/18/14 0810  . ondansetron (ZOFRAN-ODT) disintegrating tablet 4 mg  4 mg Oral Q6H PRN Encarnacion Slates, NP   4 mg at 11/18/14 1147  . potassium chloride (KLOR-CON) packet 20 mEq  20 mEq Oral Once Mojeed Akintayo   20 mEq at 11/16/14 1700  . sucralfate (CARAFATE) tablet 1 g  1 g Oral TID Harriet Butte, NP   1 g at 11/18/14 0810  . thiamine (VITAMIN B-1) tablet 100 mg  100 mg Oral Daily Encarnacion Slates, NP   100 mg at 11/18/14 0810  . traZODone (DESYREL) tablet 300 mg  300 mg Oral QHS Harriet Butte, NP   300 mg at 11/17/14 2023    Lab Results:  Results for orders placed or performed during the hospital encounter of 11/16/14 (from the past 48 hour(s))  Basic metabolic panel     Status: None   Collection Time: 11/17/14  6:30 AM  Result Value Ref Range   Sodium 139 135 - 145 mmol/L   Potassium 4.1 3.5 - 5.1 mmol/L   Chloride 106 96 - 112 mmol/L   CO2 25 19 - 32 mmol/L   Glucose, Bld 99 70 - 99 mg/dL   BUN 14 6 - 23 mg/dL   Creatinine, Ser 0.76 0.50 - 1.10 mg/dL   Calcium 8.5 8.4 - 10.5 mg/dL   GFR calc non Af Amer >90 >90 mL/min   GFR calc Af Amer >90 >90 mL/min    Comment: (NOTE) The eGFR has been calculated using the CKD EPI equation. This calculation has not been validated in all clinical situations. eGFR's persistently  <90 mL/min signify possible Chronic Kidney Disease.    Anion gap 8 5 - 15    Comment: Performed at [facility]    Physical Findings: AIMS: Facial and Oral Movements Muscles of Facial Expression: None, normal Lips and Perioral Area: None, normal Jaw: None, normal Tongue: None, normal,Extremity Movements Upper (arms, wrists, hands, fingers): None, normal Lower (legs, knees, ankles, toes): None, normal, Trunk Movements Neck, shoulders, hips: None, normal, Overall Severity Severity of abnormal movements (highest score from questions above): None, normal Incapacitation due to abnormal movements: None, normal, Dental Status Current problems with teeth and/or dentures?: No Does patient usually wear dentures?: No  CIWA:  CIWA-Ar Total: 6 COWS:     Treatment Plan Summary: 1. Continue crisis management and stabilization. 2. Medication management to reduce current symptoms to base line and improve patient's overall level of functioning 3. Treat health problems as indicated. 4. Develop treatment plan to decrease risk of relapse upon discharge and the need for readmission. 5. Psycho-social education regarding relapse prevention and self care. 6. Health care follow up as needed for medical problems. 7. Continue home medications where appropriate. 8. Will Add Pepcid AC  9. Will retrieve asthma medication from her locker for her to use.   Medical Decision Making:  Established Problem, Stable/Improving (1)    Marlane Hatcher. Mashburn RPAC 12:43 PM 11/18/2014   Agree with Progress Note as above

## 2014-11-18 NOTE — [facility] Group Notes (Signed)
[facility] Group Notes:  (Clinical Social Work)  11/18/2014   10:00am-11:00am  Summary of Progress/Problems:  The main focus of today's process group was to discuss different healthy supports that can be put into place at discharge, including music.  Several inspirational songs were played and lyrics handed out, discussed.   The patient expressed understanding of the need to use a variety of support including professionals, music, walking, AA/NA meetings, more.  She spoke often during group, and asked group several times where she should start.  She did not accept the answers given.  Type of Therapy:  Music Therapy   Participation Level:  Active  Participation Quality:  Attentive and Sharing  Affect:  Blunted  Cognitive:  Oriented  Insight:  Engaged  Engagement in Therapy:  Engaged  Modes of Intervention:   Activity, Exploration  Ambrose MantleMareida Grossman-Orr, LCSW 11/18/2014, 11:00am

## 2014-11-18 NOTE — Progress Notes (Signed)
Patient did not attend the evening speaker AA meeting. Pt remained in her bed during group time.  Pt came out of her room for snack time and was very demanding and rude. Pt yelled after this writer, " I don't get any snacks, everyone gets snacks but me". Snacks were placed out on the table and other patients hadn't even received theirs yet. Pt was told by this writer that the snacks were available at this time, they were on the table, and that all patients here are treated the equally. Pt continued to talk and curse under her breath about this Clinical research associatewriter.

## 2014-11-18 NOTE — Progress Notes (Signed)
D) Pt has been attending the program and participates in the groups. Pt has a lot of anxiety and requires frequent contact with a lot of reassurance. Affect is pensive and mood scared and frightened. Has approached this writer several times seeking reassurance that "all is going to be ok. You are not going to die" States that she "does not know how she could have done all that she has done for as many years as I have done it and have worked and still be alive". Feels that she is dying related to the pain that she is having in her abdomen. Pt was throwing up earlier. A) given support and reassurance. Provided with numerous 1:1's. Encouragement and praise given. Gentle voice used with Pt. R) Denies SI and HI. Continues with increased anxiety.

## 2014-11-19 MED ORDER — POLYETHYLENE GLYCOL 3350 17 G PO PACK
17.0000 g | PACK | Freq: Every day | ORAL | Status: DC
  Administered 2014-11-19: 17 g via ORAL
  Filled 2014-11-19 (×4): qty 1

## 2014-11-19 MED ORDER — MAGNESIUM CITRATE PO SOLN
1.0000 | Freq: Once | ORAL | Status: AC
  Administered 2014-11-19: 1 via ORAL

## 2014-11-19 NOTE — Plan of Care (Signed)
Problem: Alteration in mood & ability to function due to Goal: STG-Patient will comply with prescribed medication regimen (Patient will comply with prescribed medication regimen)  Outcome: Progressing Pt has been compliant with all medications this shift.       

## 2014-11-19 NOTE — Progress Notes (Signed)
Patient ID: MYONNA CHISOM, female   DOB: 09-21-1965, 49 y.o.   MRN: [id number] Patient ID: JANAIAH VETRANO, female   DOB: Mar 18, 1966, 49 y.o.   MRN: [id number] [facility] MD Progress Note  11/19/2014 4:23 PM SHAKEDA PEARSE  MRN:  [id number]  Subjective:  Kaesha says today that she is tired but feeling better overall.  Says she is still having some withdrawal symptoms because her whole body is feeling it. She says she is feeling relieved knowing that she still has her job. She says her mood is "improving''. Denise asked for a pair of men's size 36 jeans and a cute T-shirt to wear this evening. She says she has someone visiting her this evening. Wants to look good & clean.  Principal Problem: Major depressive disorder, recurrent episode, severe Diagnosis:   Patient Active Problem List   Diagnosis Date Noted  . Major depressive disorder, recurrent, severe without psychotic features [F33.2]   . Alcohol use disorder, severe, dependence [F10.20] 11/16/2014  . Major depressive disorder, recurrent episode, severe [F33.2] 11/16/2014    Class: Chronic  . Cocaine use disorder, severe, dependence [F14.20] 11/16/2014  . Acute calculous cholecystitis s/p lap chole 02/09/2014 [K80.00] 02/09/2014  . HTN (hypertension) [I10] 07/25/2013  . Pain in vertebral column [M54.9] 05/05/2013  . Carpal tunnel syndrome [G56.00] 11/18/2012  . Numbness and tingling of both legs below knees [R20.0] 11/18/2012  . Lumbago [M54.5] 11/18/2012  . Anxiety state, unspecified [F41.1] 11/18/2012   Total Time spent with patient: 20 minutes  Past Medical History:  Past Medical History  Diagnosis Date  . Depression   . Anxiety   . Carpal tunnel syndrome, bilateral   . GERD (gastroesophageal reflux disease)   . Neuropathy   . Hypertension   . Asthma   . PUD (peptic ulcer disease)     Past Surgical History  Procedure Laterality Date  . Rotator cuff repair Right 2011  . Hammer toe fusion Bilateral 2008  . Carpal tunnel release  Right 2003  . Cholecystectomy N/A 02/09/2014    Procedure: LAPAROSCOPIC CHOLECYSTECTOMY WITH INTRAOPERATIVE CHOLANGIOGRAM;  Surgeon: Valarie Merino, MD;  Location: [facility];  Service: General;  Laterality: N/A;   Family History:  Family History  Problem Relation Age of Onset  . Cancer Mother     pancreatic  . Heart failure Father   . Cancer Paternal Grandmother   . HIV/AIDS Brother    Social History:  History  Alcohol Use  . Yes    Comment: 6 pack to a case of beer a day     History  Drug Use  . Yes  . Special: "Crack" cocaine    History   Social History  . Marital Status: Single    Spouse Name: N/A  . Number of Children: N/A  . Years of Education: N/A   Occupational History  . [name]   Social History Main Topics  . Smoking status: Never Smoker   . Smokeless tobacco: Not on file  . Alcohol Use: Yes     Comment: 6 pack to a case of beer a day  . Drug Use: Yes    Special: "Crack" cocaine  . Sexual Activity: No   Other Topics Concern  . None   Social History Narrative   Pt lives at home alone. She has a HS education level and does not have any children.    She drinks 2 cups of caffeine daily.   Additional History:  Sleep: Good  Appetite:  Fair  Assessment: [name] is seen, chart reviewed. [name] is alert, oriented and up and active on the unit. She is  says she spoke with her employer and there will be some consequences for her at work, however, she does good better about the conversation over all. [name] is encouraged to continue participation in group milieu. She is tolerating her medication without problems.  Musculoskeletal: Strength & Muscle Tone: within normal limits Gait & Station: normal Patient leans: N/A  Psychiatric Specialty Exam: Physical Exam  ROS  Blood pressure 107/79, pulse 100, temperature 97.8 F (36.6 C), temperature source Oral, resp. rate 19, last menstrual period 10/10/2014, SpO2 99 %.There is no weight on file to  calculate BMI.  General Appearance: Disheveled  Eye SolicitorContact::  Fair  Speech:  Clear and Coherent  Volume:  Normal  Mood:  "Improving"  Affect:  Congruent  Thought Process:  Coherent and Intact  Orientation:  Full (Time, Place, and Person)  Thought Content:  Rumination and denies any hallucination, delusions or paranoia  Suicidal Thoughts:  No  Homicidal Thoughts:  No  Memory:  Fairly intact  Judgement:  Poor  Insight:  Present  Psychomotor Activity:  Normal  Concentration:  Fair  Recall:  Fair  Fund of Knowledge:Fair  Language: Good  Akathisia:  NA  Handed:  Right  AIMS (if indicated):     Assets:  Communication Skills Desire for Improvement Financial Resources/Insurance Housing Physical Health Social Support Vocational/Educational  ADL's:  Impaired  Cognition: WNL and Impaired,  Mild  Sleep:  Number of Hours: 6.75   Current Medications: Current Facility-Administered Medications  Medication Dose Route Frequency Provider Last Rate Last Dose  . acetaminophen (TYLENOL) tablet 650 mg  650 mg Oral Q6H PRN Worthy FlankIjeoma E Nwaeze, NP   650 mg at 11/18/14 2032  . alum & mag hydroxide-simeth (MAALOX/MYLANTA) 200-200-20 MG/5ML suspension 30 mL  30 mL Oral Q4H PRN Worthy FlankIjeoma E Nwaeze, NP      . Melene Muller[START ON 11/20/2014] chlordiazePOXIDE (LIBRIUM) capsule 25 mg  25 mg Oral Daily Sanjuana KavaAgnes I Nwoko, NP      . cyclobenzaprine (FLEXERIL) tablet 5 mg  5 mg Oral TID PRN Tamala JulianNeil T Mashburn, PA-C   5 mg at 11/19/14 0841  . feeding supplement (ENSURE ENLIVE) (ENSURE ENLIVE) liquid 237 mL  237 mL Oral BID BM Rachael FeeIrving A Lugo, MD   237 mL at 11/19/14 1000  . gabapentin (NEURONTIN) capsule 300 mg  300 mg Oral Q breakfast Rachael FeeIrving A Lugo, MD   300 mg at 11/19/14 0845  . gabapentin (NEURONTIN) capsule 600 mg  600 mg Oral QHS Rachael FeeIrving A Lugo, MD   600 mg at 11/18/14 2119  . lisinopril (PRINIVIL,ZESTRIL) tablet 10 mg  10 mg Oral Daily Sanjuana KavaAgnes I Nwoko, NP   10 mg at 11/19/14 0846  . magnesium hydroxide (MILK OF MAGNESIA) suspension  30 mL  30 mL Oral Daily PRN Worthy FlankIjeoma E Nwaeze, NP      . mirtazapine (REMERON) tablet 15 mg  15 mg Oral QHS Mojeed Akintayo   15 mg at 11/18/14 2119  . multivitamin with minerals tablet 1 tablet  1 tablet Oral Daily Sanjuana KavaAgnes I Nwoko, NP   1 tablet at 11/19/14 0844  . nicotine (NICODERM CQ - dosed in mg/24 hours) patch 21 mg  21 mg Transdermal Daily Mojeed Akintayo   21 mg at 11/19/14 0846  . pantoprazole (PROTONIX) EC tablet 80 mg  80 mg Oral Daily Tamala JulianNeil T Mashburn, PA-C  80 mg at 11/19/14 0844  . polyethylene glycol (MIRALAX / GLYCOLAX) packet 17 g  17 g Oral Daily Adonis Brook, NP   17 g at 11/19/14 1410  . potassium chloride (KLOR-CON) packet 20 mEq  20 mEq Oral Once Mojeed Akintayo   20 mEq at 11/16/14 1700  . sucralfate (CARAFATE) tablet 1 g  1 g Oral TID Worthy Flank, NP   1 g at 11/19/14 1139  . thiamine (VITAMIN B-1) tablet 100 mg  100 mg Oral Daily Sanjuana Kava, NP   100 mg at 11/19/14 0846  . traZODone (DESYREL) tablet 300 mg  300 mg Oral QHS Worthy Flank, NP   300 mg at 11/18/14 2119   Lab Results:  No results found for this or any previous visit (from the past 48 hour(s)).  Physical Findings: AIMS: Facial and Oral Movements Muscles of Facial Expression: None, normal Lips and Perioral Area: None, normal Jaw: None, normal Tongue: None, normal,Extremity Movements Upper (arms, wrists, hands, fingers): None, normal Lower (legs, knees, ankles, toes): None, normal, Trunk Movements Neck, shoulders, hips: None, normal, Overall Severity Severity of abnormal movements (highest score from questions above): None, normal Incapacitation due to abnormal movements: None, normal, Dental Status Current problems with teeth and/or dentures?: No Does patient usually wear dentures?: No  CIWA:  CIWA-Ar Total: 5 COWS:     Treatment Plan Summary: 1. Continue crisis management and stabilization. 2. Medication management to reduce current symptoms to base line and improve patient's overall level  of functioning; continue detoxification tx protocols, Gabapentin 300 mg 7 600 mg respectively for agitation, Mirtazapine 15 mg Q hs for insomnia & Trazodone 300 mg Q hs for insomnia. 3. Treat health problems as indicated; Lisinopril 10 mg Q hs for HTN. 4. Develop treatment plan to decrease risk of relapse upon discharge and the need for readmission. 5. Psycho-social education regarding relapse prevention and self care. 6. Health care follow up as needed for medical problems. 7. Continue home medications where appropriate. 8. Will Add Pepcid AC  9. Will retrieve asthma medication from her locker for her to use.  Medical Decision Making:  Established Problem, Stable/Improving (1)   Sanjuana Kava, FNP, PMHNP-BC  4:23 PM 11/19/2014   Agree with Progress Note as above

## 2014-11-19 NOTE — [facility] Group Notes (Signed)
[facility] Note   11/19/2014 11:34 AM  Participation Quality:  Monopolizing but redirectable   Mood/Affect:  Anxious  Depression Rating:  7-8  Anxiety Rating:  7-8  Thoughts of Suicide:  No Will you contract for safety?   NA  Current AVH:  No  Plan for Discharge/Comments:  Pt reports that she is sleepy this morning and feels cloudy headed. Pt reports that she is no longer sure if she wants inpatient treatment. CSW to meet with pt individually at 1pm today to discuss d/c plan/aftercare. Pt reports that she may need to return to work at d/c. Pt can be demanding at times, but apologizes after redirection. Pt demonstrating high anxiety and some confusion. Pt reports feeling overwhelmed "at all I have to do." She complains of pancreatic pain "I know I really damaged something when I used all that crack last week." Pt's RN notified and pt urged to speak with MD about this.   Transportation Means: unknown at this time  [place]   Smart, Rush ValleyHeather LCSWA

## 2014-11-19 NOTE — Progress Notes (Signed)
D: Pt has irritable, anxious affect and mood.  Pt reports she "went to group.  I tried."  Pt attended part of evening group.  Pt denies SI/HI, denies hallucinations.  She reported abdominal pain of 7/10.  Pt stated "I talked to my boss.  I still got my job.  I missed a week.  I aint going to have no money."  Pt was visible in the milieu.  She was irritable with MHT but was able to de-escalate after talking with staff 1:1.   A: Medications administered per order.  Encouraged and supported pt.  Actively listened to pt.  PO fluids encouraged and provided.  PRN medication administered for pain and muscle spasms, see flow sheet.   R: Pt is compliant with medications.  She verbally contracts for safety.  Will continue to monitor and assess.

## 2014-11-19 NOTE — Progress Notes (Signed)
Pt attended the AA speaker meeting for wrap up group.

## 2014-11-19 NOTE — [facility] Group Notes (Signed)
[facility] LCSW Group Therapy  11/19/2014 4:00 PM  Type of Therapy:  Group Therapy  Participation Level:  Did Not Attend-pt resting in room.   Summary of Progress/Problems: Today's Topic: Overcoming Obstacles. Pts identified obstacles faced currently and processed barriers involved in overcoming these obstacles. Pts identified steps necessary for overcoming these obstacles and explored motivation (internal and external) for facing these difficulties head on. Pt further identified one area of concern in their lives and chose a skill of focus pulled from their "toolbox."   Smart, EltonHeather LCSWA  11/19/2014, 4:00 PM

## 2014-11-19 NOTE — Progress Notes (Signed)
D: Patient's affect and mood are both depressed and irritable. She reported on the self inventory sheet that she's sleeping fair at night, poor appetite and ability to concentrate and low energy level. Patient rates feelings of hopelessness and anxiety "7". She voices "I'm very angry today because I can't believe I relapsed after 9 years", "I put myself in this place". Patient adheres to the current medication regimen.  A: Support and encouragement provided to patient. Administered medications per ordering MD. Monitor Q15 minute checks for safety.  R: Patient receptive. Denies SI/HI and AVH. Patient remains safe on the unit.

## 2014-11-19 NOTE — Progress Notes (Signed)
Recreation Therapy Notes  Date: 03.28.2016 Time: 9:30am Location: [address] Group Room   Group Topic: Stress Management  Goal Area(s) Addresses:  Patient will actively participate in stress management techniques presented during session.   Behavioral Response: Did not attend.   Marykay Lexenise L Sacoya Mcgourty, LRT/CTRS  Retaj Hilbun L 11/19/2014 10:05 AM

## 2014-11-19 NOTE — Progress Notes (Signed)
NUTRITION ASSESSMENT  Pt identified as at risk on the Malnutrition Screen Tool  INTERVENTION: 1. Educated patient on the importance of nutrition and encouraged intake of food and beverages. 2. Discussed weight goals. 3. Supplements: Continue Ensure Enlive po BID, each supplement provides 350 kcal and 20 grams of protein  NUTRITION DIAGNOSIS: Unintentional weight loss related to sub-optimal intake as evidenced by pt report.   Goal: Pt to meet >/= 90% of their estimated nutrition needs.  Monitor:  PO intake  Assessment:  Pt admitted with depression, ETOH, and cocaine abuse.  Pt reports Hx of gallbladder surgery ~5 months ago. Prior to surgery, she weighed 237 lb and has lost weight since. Per weight history, pt has lost 43 lb since 02/09/14 (19% weight loss x 9 months, significant for time frame).  Pt states she is drinking her Ensure supplements that are being provided.  Discussed with pt the importance of eating 3 meals a day with snacks, emphasizing protein consumption. Discussed the importance of good nutrition for mental health and aiding in recovery.   Height: Ht Readings from Last 1 Encounters:  11/15/14 5\' 8"  (1.727 m)    Weight: Wt Readings from Last 1 Encounters:  11/15/14 180 lb (81.647 kg)    Weight Hx: Wt Readings from Last 10 Encounters:  11/15/14 180 lb (81.647 kg)  03/21/14 199 lb (90.266 kg)  03/07/14 204 lb (92.534 kg)  02/09/14 223 lb (101.152 kg)  07/25/13 223 lb (101.152 kg)  05/25/13 215 lb (97.523 kg)  02/23/13 214 lb 12.8 oz (97.433 kg)  02/17/13 207 lb (93.895 kg)  11/18/12 207 lb 8 oz (94.121 kg)    BMI:  27.8 Pt meets criteria for overweight based on current BMI.  Estimated Nutritional Needs: Kcal: 30-35 kcal/kg Protein: > 1 gram protein/kg Fluid: 1 ml/kcal  Diet Order: Diet regular Room service appropriate?: Yes; Fluid consistency:: Thin Pt is also offered choice of unit snacks mid-morning and mid-afternoon.  Pt is eating as  desired.   Lab results and medications reviewed.   Amanda FrancoLindsey Charnita Trudel, MS, RD, LDN Pager: [phone] After Hours Pager: [phone]

## 2014-11-20 MED ORDER — NALTREXONE HCL 50 MG PO TABS
25.0000 mg | ORAL_TABLET | Freq: Every day | ORAL | Status: DC
  Administered 2014-11-20 – 2014-11-21 (×2): 25 mg via ORAL
  Filled 2014-11-20 (×3): qty 1

## 2014-11-20 NOTE — [facility] Group Notes (Signed)
[facility] LCSW Group Therapy  11/20/2014   1:15 PM   Type of Therapy:  Group Therapy  Participation Level:  Active  Participation Quality:  Attentive, Sharing and Supportive  Affect:  Appropriate  Cognitive:  Alert and Oriented  Insight:  Developing/Improving and Engaged  Engagement in Therapy:  Developing/Improving and Engaged  Modes of Intervention:  Clarification, Confrontation, Discussion, Education, Exploration, Limit-setting, Orientation, Problem-solving, Rapport Building, Dance movement psychotherapisteality Testing, Socialization and Support  Summary of Progress/Problems: The topic for group therapy was feelings about diagnosis.  Pt actively participated in group discussion on their past and current diagnosis and how they feel towards this.  Pt also identified how society and family members judge them, based on their diagnosis as well as stereotypes and stigmas.  Patient shared that she experiences guilt, shame, and disappointment related to her relapse. She shared that she has had 9 years of sobriety in the past. Patient also shared that she is in an unhealthy relationship. Patient reports that she has a goal of going to [employer]. CSW and other group members provided patient with emotional support and encouragement.  Samuella BruinKristin Mearl Olver, MSW, [employer] IncLCSWA Clinical Social Worker [facility] [phone]

## 2014-11-20 NOTE — Progress Notes (Signed)
D: Pt has irritable affect and mood.  Pt was on the phone at the beginning of the shift, loudly using profanity while talking on the phone.  Pt reported "my fucking lover.  I'm getting sick of her."  Pt stated "my stomach is still hurting."  Pt reported having BM today after taking X1 medication.  Pt reported her goal was "to stay clean.  I'm leaving Wednesday.  I'm going back to work Thursday."  Pt reports she'll go to outpatient treatment and "go to my meetings again."  Pt stated "I do have my job.  I am going to have consequences though."  Pt expressed feeling "mad" that she relapsed.  Pt is demanding at times.  She denies SI/HI, denies hallucinations.  She expresses appreciation towards staff. A: Introduced self to pt.  Medications administered per order.  Encouraged and supported pt.  Redirected pt as needed.  Actively listened to pt and provided reassurance.  PRN medication administered for pain, muscle spasms, see flow sheet.  R: Pt is compliant with medications.  Pt verbally contracts for safety.  Will continue to monitor and assess.

## 2014-11-20 NOTE — [facility] Group Notes (Signed)
Adult Psychoeducational Group Note  Date:  11/20/2014 Time:  11:25 PM  Group Topic/Focus:  Wrap-Up Group:   The focus of this group is to help patients review their daily goal of treatment and discuss progress on daily workbooks.  Participation Level:  Did Not Attend  Participation Quality:  None  Affect:  None  Cognitive:  None  Insight: None  Engagement in Group:  None  Modes of Intervention:  Discussion  Additional Comments:  [name] did not attend group.  Caroll RancherLindsay, Charlis Harner A 11/20/2014, 11:25 PM

## 2014-11-20 NOTE — [facility] Group Notes (Signed)
The focus of this group is to educate the patient on the purpose and policies of crisis stabilization and provide a format to answer questions about their admission.  The group details unit policies and expectations of patients while admitted.  Patient attended the first half of the 0900 nurse education orientation group this morning.  Patient stated she wanted sex.  Patient informed that was inappropriate.  Patient also stated she was upset about going back to work and how to deal with negative statements from friends.  Patient stated she will work on 3 goals for discharge.

## 2014-11-20 NOTE — Plan of Care (Signed)
Problem: Alteration in mood & ability to function due to Goal: STG-Patient will attend groups Outcome: Progressing Pt attended evening group on 11/19/14.  Problem: Alteration in mood Goal: LTG-Patient reports reduction in suicidal thoughts (Patient reports reduction in suicidal thoughts and is able to verbalize a safety plan for whenever patient is feeling suicidal)  Outcome: Progressing Pt denied SI this shift and verbally contracted for safety.

## 2014-11-20 NOTE — Progress Notes (Signed)
Patient ID: Amanda [name], female   DOB: [birth year], 49 y.o.   MRN: [id number] [facility] MD Progress Note  11/20/2014 10:37 AM Amanda [name]  MRN:  [id number]  Subjective:  " I am still anxious and depressed.''  Objective: Patient seen and her chart was reviewed. She is endorsing decreased mood swings, anxiety, depressive symptoms and craving for drugs and alcohol. However, she is requesting for a medication or intervention that will keep her from doing drugs or drink alcohol. She states that she is fully employed and does not want to lose her job because of Alcohol and drugs. Patient rates her anxiety at 5/10 and depression at 4/10 today, she hopes to be discharged tomorrow so that she can go back to her job on Thursday. Apart from a little nervousness and fine hand tremors patient seems to be doing much better today. She denies suicidal thoughts, psychosis or delusional thinking. Patient has been compliant with her medications and the unit milieu.  Principal Problem: Major depressive disorder, recurrent episode, severe Diagnosis:   Patient Active Problem List   Diagnosis Date Noted  . Alcohol use disorder, severe, dependence [F10.20] 11/16/2014    Priority: High  . Major depressive disorder, recurrent episode, severe [F33.2] 11/16/2014    Priority: High    Class: Chronic  . Cocaine use disorder, severe, dependence [F14.20] 11/16/2014    Priority: High  . Major depressive disorder, recurrent, severe without psychotic features [F33.2]   . Acute calculous cholecystitis s/p lap chole 02/09/2014 [K80.00] 02/09/2014  . HTN (hypertension) [I10] 07/25/2013  . Pain in vertebral column [M54.9] 05/05/2013  . Carpal tunnel syndrome [G56.00] 11/18/2012  . Numbness and tingling of both legs below knees [R20.0] 11/18/2012  . Lumbago [M54.5] 11/18/2012  . Anxiety state, unspecified [F41.1] 11/18/2012   Total Time spent with patient: 20 minutes  Past Medical History:  Past Medical History  Diagnosis  Date  . Depression   . Anxiety   . Carpal tunnel syndrome, bilateral   . GERD (gastroesophageal reflux disease)   . Neuropathy   . Hypertension   . Asthma   . PUD (peptic ulcer disease)     Past Surgical History  Procedure Laterality Date  . Rotator cuff repair Right 2011  . Hammer toe fusion Bilateral 2008  . Carpal tunnel release Right 2003  . Cholecystectomy N/A 02/09/2014    Procedure: LAPAROSCOPIC CHOLECYSTECTOMY WITH INTRAOPERATIVE CHOLANGIOGRAM;  Surgeon: Valarie Merino, MD;  Location: [facility];  Service: General;  Laterality: N/A;   Family History:  Family History  Problem Relation Age of Onset  . Cancer Mother     pancreatic  . Heart failure Father   . Cancer Paternal Grandmother   . HIV/AIDS Brother    Social History:  History  Alcohol Use  . Yes    Comment: 6 pack to a case of beer a day     History  Drug Use  . Yes  . Special: "Crack" cocaine    History   Social History  . Marital Status: Single    Spouse Name: N/A  . Number of Children: N/A  . Years of Education: N/A   Occupational History  . [name]   Social History Main Topics  . Smoking status: Never Smoker   . Smokeless tobacco: Not on file  . Alcohol Use: Yes     Comment: 6 pack to a case of beer a day  . Drug Use: Yes    Special: "Crack"  cocaine  . Sexual Activity: No   Other Topics Concern  . None   Social History Narrative   Pt lives at home alone. She has a HS education level and does not have any children.    She drinks 2 cups of caffeine daily.   Additional History:    Sleep: Good  Appetite:  Fair  Assessment: Amanda [name] is seen, chart reviewed. Amanda [name] is alert, oriented and up and active on the unit. She is  says she spoke with her employer and there will be some consequences for her at work, however, she does good better about the conversation over all. Amanda [name] is encouraged to continue participation in group milieu. She is tolerating her medication without  problems.  Musculoskeletal: Strength & Muscle Tone: within normal limits Gait & Station: normal Patient leans: N/A  Psychiatric Specialty Exam: Physical Exam  ROS  Blood pressure 147/76, pulse 117, temperature 98.1 F (36.7 C), temperature source Oral, resp. rate 18, last menstrual period 10/10/2014, SpO2 99 %.There is no weight on file to calculate BMI.  General Appearance: casual  Eye Contact::  Fair  Speech:  Clear and Coherent  Volume:  Normal  Mood:  "Improving"  Affect:  Congruent  Thought Process:  Coherent and Intact  Orientation:  Full (Time, Place, and Person)  Thought Content:  Rumination and denies any hallucination, delusions or paranoia  Suicidal Thoughts:  No  Homicidal Thoughts:  No  Memory:  Fairly intact  Judgement:  Poor  Insight:  Present  Psychomotor Activity:  Normal  Concentration:  Fair  Recall:  Fair  Fund of Knowledge:Fair  Language: Good  Akathisia:  NA  Handed:  Right  AIMS (if indicated):     Assets:  Communication Skills Desire for Improvement Financial Resources/Insurance Housing Physical Health Social Support Vocational/Educational  ADL's: normal  Cognition: WNL   Sleep:  Number of Hours: 6.5   Current Medications: Current Facility-Administered Medications  Medication Dose Route Frequency Provider Last Rate Last Dose  . acetaminophen (TYLENOL) tablet 650 mg  650 mg Oral Q6H PRN Worthy FlankIjeoma E Nwaeze, NP   650 mg at 11/19/14 2113  . alum & mag hydroxide-simeth (MAALOX/MYLANTA) 200-200-20 MG/5ML suspension 30 mL  30 mL Oral Q4H PRN Worthy FlankIjeoma E Nwaeze, NP      . cyclobenzaprine (FLEXERIL) tablet 5 mg  5 mg Oral TID PRN Tamala JulianNeil T Mashburn, PA-C   5 mg at 11/20/14 0826  . feeding supplement (ENSURE ENLIVE) (ENSURE ENLIVE) liquid 237 mL  237 mL Oral BID BM Rachael FeeIrving A Lugo, MD   237 mL at 11/19/14 1000  . gabapentin (NEURONTIN) capsule 300 mg  300 mg Oral Q breakfast Rachael FeeIrving A Lugo, MD   300 mg at 11/20/14 [id number]  . gabapentin (NEURONTIN) capsule 600 mg   600 mg Oral QHS Rachael FeeIrving A Lugo, MD   600 mg at 11/19/14 2113  . lisinopril (PRINIVIL,ZESTRIL) tablet 10 mg  10 mg Oral Daily Sanjuana KavaAgnes I Nwoko, NP   10 mg at 11/20/14 [id number]  . magnesium hydroxide (MILK OF MAGNESIA) suspension 30 mL  30 mL Oral Daily PRN Worthy FlankIjeoma E Nwaeze, NP      . mirtazapine (REMERON) tablet 15 mg  15 mg Oral QHS Aseel Uhde   15 mg at 11/19/14 2113  . multivitamin with minerals tablet 1 tablet  1 tablet Oral Daily Sanjuana KavaAgnes I Nwoko, NP   1 tablet at 11/20/14 [id number]  . naltrexone (DEPADE) tablet 25 mg  25 mg Oral Daily Nelton Amsden   25 mg  at 11/20/14 0935  . nicotine (NICODERM CQ - dosed in mg/24 hours) patch 21 mg  21 mg Transdermal Daily Parris Cudworth   21 mg at 11/20/14 0823  . pantoprazole (PROTONIX) EC tablet 80 mg  80 mg Oral Daily Tamala Julian, PA-C   80 mg at 11/20/14 3086  . polyethylene glycol (MIRALAX / GLYCOLAX) packet 17 g  17 g Oral Daily Adonis Brook, NP   17 g at 11/19/14 1410  . potassium chloride (KLOR-CON) packet 20 mEq  20 mEq Oral Once Elaijah Munoz   20 mEq at 11/16/14 1700  . sucralfate (CARAFATE) tablet 1 g  1 g Oral TID Worthy Flank, NP   1 g at 11/20/14 5784  . thiamine (VITAMIN B-1) tablet 100 mg  100 mg Oral Daily Sanjuana Kava, NP   100 mg at 11/20/14 6962  . traZODone (DESYREL) tablet 300 mg  300 mg Oral QHS Worthy Flank, NP   300 mg at 11/19/14 2113   Lab Results:  No results found for this or any previous visit (from the past 48 hour(s)).  Physical Findings: AIMS: Facial and Oral Movements Muscles of Facial Expression: None, normal Lips and Perioral Area: None, normal Jaw: None, normal Tongue: None, normal,Extremity Movements Upper (arms, wrists, hands, fingers): None, normal Lower (legs, knees, ankles, toes): None, normal, Trunk Movements Neck, shoulders, hips: None, normal, Overall Severity Severity of abnormal movements (highest score from questions above): None, normal Incapacitation due to abnormal movements: None, normal,  Dental Status Current problems with teeth and/or dentures?: No Does patient usually wear dentures?: No  CIWA:  CIWA-Ar Total: 8 COWS:     Treatment Plan Summary: 1. Continue crisis management and stabilization. 2. Medication management to reduce current symptoms to base line and improve patient's overall level of functioning; continue detoxification tx protocols, Gabapentin 300 mg 7 600 mg respectively for agitation, Mirtazapine 15 mg Q hs for insomnia & Trazodone 300 mg Q hs for insomnia. 3. Treat health problems as indicated; Lisinopril 10 mg Q hs for HTN. 4. Develop treatment plan to decrease risk of relapse upon discharge and the need for readmission. 5. Psycho-social education regarding relapse prevention and self care. 6. Health care follow up as needed for medical problems. 7. Continue home medications where appropriate. 8. Will Add Pepcid AC  9. Will retrieve asthma medication from her locker for her to use. 10. Initiate Naltrexone  daily to prevent alcohol cravings.  Medical Decision Making:  Established Problem, Stable/Improving (1)   Thedore Mins, MD 10:37 AM 11/20/2014

## 2014-11-20 NOTE — Progress Notes (Signed)
D: Patient presents with angry, blunted affect and depressed, irritable mood. She reported on the self inventory sheet that both her sleep and ability to concentrate are good, appetite is fair and energy level is low. Patient rates depression "6", feelings of hopelessness "5", and anxiety "7". Her goal today is to work on forgiving herself after being sober for 9 years and relapsing. She continues to report being angry and scared about going back to work; also voicing that she's worried about what people will say and think about her. Complies with medications and tolerating them well.  A: Support and encouragement provided to patient. Scheduled medications given per MD orders. Maintain Q15 minute checks for safety.  R: Patient receptive. Denies SI/HI. Patient remains safe on the hall.

## 2014-11-20 NOTE — Tx Team (Addendum)
Interdisciplinary Treatment Plan Update (Adult)   Date: 11/20/2014   Time Reviewed: 9:30AM Progress in Treatment:  Attending groups:Yes Participating in groups: Yes Taking medication as prescribed: Yes  Tolerating medication: Yes  Family/Significant othe contact made: Not yet. SPE required for this pt.   Patient understands diagnosis: Yes, AEB seeking treatment for SI/depression/mood instability, crack cocaine abuse, and for medication stabilization.  Discussing patient identified problems/goals with staff: Yes  Medical problems stabilized or resolved: Yes  Denies suicidal/homicidal ideation: Yes during self report.  Patient has not harmed self or Others: Yes  New problem(s) identified:  Discharge Plan or Barriers:  3/25: Pt requesting referral to [facility]. 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 CSW assessing for appropriate referrals.   3/29: Patient plans to return to previous living situation and will follow up with outpatient services.  Additional comments:  49 y/o female who presents voluntarily for substance abuse, SI, and depression. Patient states she is currently homeless and living in a shelter. 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 Patient states yesterday 11/15/2014 she spent all day in a crack house and used $700.00 worth of crack. Patient states she has also been drinking a 6 pack- a case of beer a day. Patient states, "Im tired and I want to get help." Patient states she is passive SI but verbally contracts for safety. Patient denies HI and denies AVH. Patient states he stressors are, her gf left her her in December, homelessness, drug use, and possible job loss. Patient is fidgety and states she is craving drugs during the admission process but states she want the help. Patient states she takes medications she gets from  [facility] but states she has not had them in one month. Reason for Continuation of Hospitalization: Depression/mood instability Medication management  Estimated length of stay: Discharge anticipated for tomorrow 3/30. For review of initial/current patient goals, please see plan of care.   Attendees: Patient:    Family:    Physician: Dr. Jannifer FranklinAkintayo 11/20/2014 9:30 AM  Nursing: Harold Barbanonecia Byrd, Quintella ReichertBeverly Knight RN 11/20/2014 9:30 AM  Clinical Social Worker: Samuella BruinKristin Quinlan Vollmer, LCSWA 11/20/2014 9:30 AM  Other: Trula SladeHeather Smart, LCSWA 11/20/2014 9:30 AM  Other:  11/20/2014 9:30 AM  Other:  11/20/2014 9:30 AM  Other: Serena ColonelAggie Nwoko, NP 11/20/2014 9:30 AM  Other:                 Samuella BruinKristin Jamaya Sleeth, MSW, LCSWA Clinical Social Worker [facility] [phone]

## 2014-11-20 NOTE — Clinical Social Work Note (Signed)
CSW attempted to contact patient's sister Cala BradfordKimberly [phone], left voicemail and awaiting call back.  Samuella BruinKristin Shaka Cardin, MSW, [employer]WA Clinical Social Worker [facility] [phone]

## 2014-11-21 MED ORDER — LISINOPRIL 10 MG PO TABS: 10.0000 mg | ORAL_TABLET | Freq: Every day | ORAL | Status: DC

## 2014-11-21 MED ORDER — PANTOPRAZOLE SODIUM 40 MG PO TBEC: 80.0000 mg | DELAYED_RELEASE_TABLET | Freq: Every day | ORAL | Status: DC

## 2014-11-21 MED ORDER — CYCLOBENZAPRINE HCL 5 MG PO TABS: 5.0000 mg | ORAL_TABLET | Freq: Three times a day (TID) | ORAL | Status: DC | PRN

## 2014-11-21 MED ORDER — NICOTINE 21 MG/24HR TD PT24: 21.0000 mg | MEDICATED_PATCH | Freq: Every day | TRANSDERMAL | Status: DC

## 2014-11-21 MED ORDER — MIRTAZAPINE 15 MG PO TABS: 15.0000 mg | ORAL_TABLET | Freq: Every day | ORAL | Status: DC

## 2014-11-21 MED ORDER — GABAPENTIN 300 MG PO CAPS: ORAL_CAPSULE | ORAL | Status: DC

## 2014-11-21 MED ORDER — TRAZODONE HCL 300 MG PO TABS: 300.0000 mg | ORAL_TABLET | Freq: Every day | ORAL | Status: DC

## 2014-11-21 MED ORDER — NALTREXONE HCL 50 MG PO TABS: 25.0000 mg | ORAL_TABLET | Freq: Every day | ORAL | Status: DC

## 2014-11-21 MED ORDER — SUCRALFATE 1 G PO TABS: 1.0000 g | ORAL_TABLET | Freq: Three times a day (TID) | ORAL | Status: DC

## 2014-11-21 NOTE — Plan of Care (Signed)
Problem: Alteration in mood; excessive anxiety as evidenced by: Goal: STG-Pt will report an absence of self-harm thoughts/actions (Patient will report an absence of self-harm thoughts or actions)  Outcome: Progressing Pt has not harmed herself this shift.  She denies thoughts of self-harm/SI.  Pt verbally contracts for safety.

## 2014-11-21 NOTE — Progress Notes (Signed)
Amanda [name] has been irritable yet pleasant this a.m., thanking staff for their help. She reports anger at herself for relapsing and is "scared" regarding discharge but reports readiness. She took a.m medications without complaint or issue, except for Miralax, which she declined. Pt reports BM last night. She c/o indigestion, for which she received PRN Maalox. Q15 safety checks maintained. Will continue to monitor for pt's safety until her discharge later today.

## 2014-11-21 NOTE — Progress Notes (Signed)
D: Pt has irritable affect and mood.  Pt reports her goal was "to leave tomorrow and go to work Thursday and I'm doing outpatient."  Pt reports she feels safe to leave.  Pt describes her mood as "bad."  Pt reports increased appetite today.  She reported she went to the cafeteria for meals.  Pt denies SI/HI, denies hallucinations.  Pt did not attend evening group.   A: Met with pt 1:1 and provided support and encouragement.  Actively listened to pt.  Medications administered per order.  PRN medication administered for muscle spasms, see flow sheet.   R: Pt is compliant with medications.  She verbally contracts for safety.  Will continue to monitor and assess.

## 2014-11-21 NOTE — Discharge Summary (Signed)
Physician Discharge Summary Note  Patient:  Amanda [name] is an 49 y.o., female MRN:  [id number] DOB:  1965/10/09 Patient phone:  [phone] (home)  Patient address:   [address] [place] [address],  Total Time spent with patient: 30 minutes  Date of Admission:  11/16/2014 Date of Discharge: 11/21/2014  Reason for Admission:  Suicidal ideation  Principal Problem: Major depressive disorder, recurrent, severe without psychotic features Discharge Diagnoses: Patient Active Problem List   Diagnosis Date Noted  . Major depressive disorder, recurrent, severe without psychotic features [F33.2]   . Alcohol use disorder, severe, dependence [F10.20] 11/16/2014  . Cocaine use disorder, severe, dependence [F14.20] 11/16/2014  . Acute calculous cholecystitis s/p lap chole 02/09/2014 [K80.00] 02/09/2014  . HTN (hypertension) [I10] 07/25/2013  . Pain in vertebral column [M54.9] 05/05/2013  . Carpal tunnel syndrome [G56.00] 11/18/2012  . Numbness and tingling of both legs below knees [R20.0] 11/18/2012  . Lumbago [M54.5] 11/18/2012  . Anxiety state, unspecified [F41.1] 11/18/2012    Musculoskeletal: Strength & Muscle Tone: within normal limits Gait & Station: normal Patient leans: N/A  Psychiatric Specialty Exam:  SEE SRA Physical Exam  Vitals reviewed. Psychiatric: Her mood appears anxious.    Review of Systems  Psychiatric/Behavioral: The patient is nervous/anxious.   All other systems reviewed and are negative.   Blood pressure 154/99, pulse 103, temperature 97.7 F (36.5 C), temperature source Oral, resp. rate 18, last menstrual period 10/10/2014, SpO2 99 %.There is no weight on file to calculate BMI.   Past Medical History:  Past Medical History  Diagnosis Date  . Depression   . Anxiety   . Carpal tunnel syndrome, bilateral   . GERD (gastroesophageal reflux disease)   . Neuropathy   . Hypertension   . Asthma   . PUD (peptic ulcer disease)     Past  Surgical History  Procedure Laterality Date  . Rotator cuff repair Right 2011  . Hammer toe fusion Bilateral 2008  . Carpal tunnel release Right 2003  . Cholecystectomy N/A 02/09/2014    Procedure: LAPAROSCOPIC CHOLECYSTECTOMY WITH INTRAOPERATIVE CHOLANGIOGRAM;  Surgeon: Valarie Merino, MD;  Location: [facility];  Service: General;  Laterality: N/A;   Family History:  Family History  Problem Relation Age of Onset  . Cancer Mother     pancreatic  . Heart failure Father   . Cancer Paternal Grandmother   . HIV/AIDS Brother    Social History:  History  Alcohol Use  . Yes    Comment: 6 pack to a case of beer a day     History  Drug Use  . Yes  . Special: "Crack" cocaine    History   Social History  . Marital Status: Single    Spouse Name: N/A  . Number of Children: N/A  . Years of Education: N/A   Occupational History  . [name]   Social History Main Topics  . Smoking status: Never Smoker   . Smokeless tobacco: Not on file  . Alcohol Use: Yes     Comment: 6 pack to a case of beer a day  . Drug Use: Yes    Special: "Crack" cocaine  . Sexual Activity: No   Other Topics Concern  . None   Social History Narrative   Pt lives at home alone. She has a HS education level and does not have any children.    She drinks 2 cups of caffeine daily.   Risk to  Self: Is patient at risk for suicide?: Yes What has been your use of drugs/alcohol within the last 12 months?: crack cocaine binge on 3/24 "I used $700 worth of crack and blew my whole check." Pt reports daily alcohol abuse since Dec 2015 (beer and liquor all day) after 9 years of sobriety.  Risk to Others:   Prior Inpatient Therapy:   Prior Outpatient Therapy:    Level of Care:  OP  Hospital Course:   [name] is a 49 year old African-American female. Admitted to [facility] adult unit from the [facility] ED. She reported wanting to go to hospital because she relapsed on alcohol and cocaine.  Patient also reported  being charged with prostitution.  She stated being diagnosed with Bipolar disorder many years ago.  EMMARAE COWDERY was admitted for Major depressive disorder, recurrent, severe without psychotic features and crisis management.  He was treated discharged with the medications listed below under Medication List.  Medical problems were identified and treated as needed.  Home medications were restarted as appropriate.  Improvement was monitored by observation and [name] daily report of symptom reduction.  Emotional and mental status was monitored by daily self-inventory reports completed by [name] and clinical staff.         VICTORIOUS COSIO was evaluated by the treatment team for stability and plans for continued recovery upon discharge.  BRELYN WOEHL motivation was an integral factor for scheduling further treatment.  Employment, transportation, bed availability, health status, family support, and any pending legal issues were also considered during his hospital stay.  He was offered further treatment options upon discharge including but not limited to Residential, Intensive Outpatient, and Outpatient treatment.  SHELBY PELTZ will follow up with the services as listed below under Follow Up Information.     Upon completion of this admission the patient was both mentally and medically stable for discharge denying suicidal/homicidal ideation, auditory/visual/tactile hallucinations, delusional thoughts and paranoia.       Consults:  psychiatry  Significant Diagnostic Studies:  labs: per ED  Discharge Vitals:   Blood pressure 154/99, pulse 103, temperature 97.7 F (36.5 C), temperature source Oral, resp. rate 18, last menstrual period 10/10/2014, SpO2 99 %. There is no weight on file to calculate BMI. Lab Results:   No results found for this or any previous visit (from the past 72 hour(s)).  Physical Findings: AIMS: Facial and Oral Movements Muscles of Facial Expression: None,  normal Lips and Perioral Area: None, normal Jaw: None, normal Tongue: None, normal,Extremity Movements Upper (arms, wrists, hands, fingers): None, normal Lower (legs, knees, ankles, toes): Minimal, Trunk Movements Neck, shoulders, hips: Minimal, Overall Severity Severity of abnormal movements (highest score from questions above): Minimal Incapacitation due to abnormal movements: Minimal Patient's awareness of abnormal movements (rate only patient's report): Aware, no distress, Dental Status Current problems with teeth and/or dentures?: No Does patient usually wear dentures?: No  CIWA:  CIWA-Ar Total: 4 COWS:      See Psychiatric Specialty Exam and Suicide Risk Assessment completed by Attending Physician prior to discharge.  Discharge destination:  Home  Is patient on multiple antipsychotic therapies at discharge:  No   Has Patient had three or more failed trials of antipsychotic monotherapy by history:  No    Recommended Plan for Multiple Antipsychotic Therapies: NA     Medication List    STOP taking these medications        ALPRAZolam 0.5 MG tablet  Commonly  known as:  XANAX     atenolol 50 MG tablet  Commonly known as:  TENORMIN     clonazePAM 0.5 MG tablet  Commonly known as:  KLONOPIN     cloNIDine 0.1 MG tablet  Commonly known as:  CATAPRES     hydrochlorothiazide 25 MG tablet  Commonly known as:  HYDRODIURIL     hydrOXYzine 50 MG capsule  Commonly known as:  VISTARIL     omeprazole 20 MG capsule  Commonly known as:  PRILOSEC     OXYCONTIN 30 MG T12a  Generic drug:  OxyCODONE HCl ER      TAKE these medications      Indication   cyclobenzaprine 5 MG tablet  Commonly known as:  FLEXERIL  Take 1 tablet (5 mg total) by mouth 3 (three) times daily as needed for muscle spasms.   Indication:  Muscle Spasm     gabapentin 300 MG capsule  Commonly known as:  NEURONTIN  Take one tab (300 mg) in the am.  Then take 2 tabs ( ) in the evening.    Indication:  Neuropathic Pain     lisinopril 10 MG tablet  Commonly known as:  PRINIVIL,ZESTRIL  Take 1 tablet (10 mg total) by mouth daily.   Indication:  High Blood Pressure     mirtazapine 15 MG tablet  Commonly known as:  REMERON  Take 1 tablet (15 mg total) by mouth at bedtime.   Indication:  Trouble Sleeping, Major Depressive Disorder     naltrexone 50 MG tablet  Commonly known as:  DEPADE  Take 0.5 tablets (25 mg total) by mouth daily.   Indication:  Excessive Use of Alcohol     nicotine 21 mg/24hr patch  Commonly known as:  NICODERM CQ - dosed in mg/24 hours  Place 1 patch (21 mg total) onto the skin daily.   Indication:  Nicotine Addiction     pantoprazole 40 MG tablet  Commonly known as:  PROTONIX  Take 2 tablets (80 mg total) by mouth daily.   Indication:  Gastroesophageal Reflux Disease     sucralfate 1 G tablet  Commonly known as:  CARAFATE  Take 1 tablet (1 g total) by mouth 3 (three) times daily.   Indication:  Gastroesophageal Reflux Disease     trazodone 300 MG tablet  Commonly known as:  DESYREL  Take 1 tablet (300 mg total) by mouth at bedtime.   Indication:  Trouble Sleeping           Follow-up Information    Follow up with [facility] On 11/26/2014.   Why:  Assessment on Monday April 4th at 9 am for therapy and medication management services. Please bring ID and insurance card.   Contact information:   [address],  [place], [place] [address] [phone]       Follow up with [facility] On 12/03/2014.   Why:  Appointment for primary care services on Monday April 11th at 9 am. Please bring insurance card and ID. Please call office if you need to reschedule appointment.   Contact information:   [address]  [place], [place] [address] [phone]      Follow-up recommendations:  Activity:  as tol, diet as tol  Comments:  1.  Take all your medications as prescribed.              2.  Report any adverse side  effects to outpatient provider.  3.  Patient instructed to not use alcohol or illegal drugs while on prescription medicines.            4.  In the event of worsening symptoms, instructed patient to call 911, the crisis hotline or go to nearest emergency room for evaluation of symptoms.  Total Discharge Time:  30 min Signed: Velna HatchetSheila May Doran Nestle AGNP-BC 11/21/2014, 10:40 AM

## 2014-11-21 NOTE — B[facility] Suicide Risk Assessment (Signed)
[facility] INPATIENT:  Family/Significant Other Suicide Prevention Education  Suicide Prevention Education:  Education Completed; sister Amanda [name] [phone],  (name of family member/significant other) has been identified by the patient as the family member/significant other with whom the patient will be residing, and identified as the person(s) who will aid the patient in the event of a mental health crisis (suicidal ideations/suicide attempt).  With written consent from the patient, the family member/significant other has been provided the following suicide prevention education, prior to the and/or following the discharge of the patient.  The suicide prevention education provided includes the following:  Suicide risk factors  Suicide prevention and interventions  National Suicide Hotline telephone number  [facility] assessment telephone number  [facility] Emergency Assistance 911  [facility] and/or Residential Mobile Crisis Unit telephone number  Request made of family/significant other to:  Remove weapons (e.g., guns, rifles, knives), all items previously/currently identified as safety concern.    Remove drugs/medications (over-the-counter, prescriptions, illicit drugs), all items previously/currently identified as a safety concern.  The family member/significant other verbalizes understanding of the suicide prevention education information provided.  The family member/significant other agrees to remove the items of safety concern listed above.  Amanda [name], Amanda [name] 11/21/2014, 8:37 AM

## 2014-11-21 NOTE — Progress Notes (Signed)
  [facility] :  Will you be returning to the same living situation after discharge:  Yes,  [facility] At discharge, do you have transportation home?: Yes,  patient requesting bus pass Do you have the ability to pay for your medications: Yes,  patient will be provided with prescriptions at discharge.  Release of information consent forms completed and in the chart;  Patient's signature needed at discharge.  Patient to Follow up at: Follow-up Information    Follow up with [facility] On 11/26/2014.   Why:  Assessment on Monday April 4th at 9 am for therapy and medication management services. Please bring ID and insurance card.   Contact information:   [address],  [place], [place] [address] [phone]       [facility] On 12/03/2014.   Why:  Appointment for primary care services on Monday April 11th at 9 am. Please bring insurance card and ID. Please call office if you need to reschedule appointment.   Contact information:   [address],  [place], [place] [address] [phone]      Patient denies SI/HI: Yes,  denies    Aeronautical engineerafety Planning and Suicide Prevention discussed: Yes,  with patient and sister  Have you used any form of tobacco in the last 30 days? (Cigarettes, Smokeless Tobacco, Cigars, and/or Pipes): No  Has patient been referred to the Quitline?: N/A patient is not a smoker  Amanda [name] 11/21/2014, 10:22 AM

## 2014-11-21 NOTE — [facility] Suicide Risk Assessment (Signed)
[facility]   Demographic Factors:  NA  Total Time spent with patient: 20 minutes  Musculoskeletal: Strength & Muscle Tone: within normal limits Gait & Station: normal Patient leans: N/A  Psychiatric Specialty Exam: Physical Exam  Review of Systems  Psychiatric/Behavioral: Positive for substance abuse (stable). Negative for depression, suicidal ideas and hallucinations. The patient is not nervous/anxious.     Blood pressure 154/99, pulse 103, temperature 97.7 F (36.5 C), temperature source Oral, resp. rate 18, last menstrual period 10/10/2014, SpO2 99 %.There is no weight on file to calculate BMI.  General Appearance: Casual  Eye Contact::  Good  Speech:  Clear and Coherent409  Volume:  Normal  Mood:  Euthymic  Affect:  Appropriate  Thought Process:  Coherent  Orientation:  Full (Time, Place, and Person)  Thought Content:  WDL  Suicidal Thoughts:  No  Homicidal Thoughts:  No  Memory:  Immediate;   Fair Recent;   Fair Remote;   Fair  Judgement:  Fair  Insight:  Fair  Psychomotor Activity:  Normal  Concentration:  Fair  Recall:  FiservFair  Fund of Knowledge:Fair  Language: Fair  Akathisia:  No  Handed:  Right  AIMS (if indicated):     Assets:  Communication Skills Desire for Improvement  Sleep:  Number of Hours: 6.75  Cognition: WNL  ADL's:  Intact   Have you used any form of tobacco in the last 30 days? (Cigarettes, Smokeless Tobacco, Cigars, and/or Pipes): No  Has this patient used any form of tobacco in the last 30 days? (Cigarettes, Smokeless Tobacco, Cigars, and/or Pipes) Yes, A prescription for an FDA-approved tobacco cessation medication was offered at discharge and the patient refused  Mental Status Per Nursing Assessment::   On Admission:  Suicidal ideation indicated by patient, Self-harm thoughts  Current Mental Status by Physician: pt denies SI/HI/AH/VH  Loss Factors: NA  Historical Factors: Impulsivity  Risk Reduction  Factors:   Employed and Positive coping skills or problem solving skills  Continued Clinical Symptoms:  Depression:   Comorbid alcohol abuse/dependence Alcohol/Substance Abuse/Dependencies  Cognitive Features That Contribute To Risk:  None    Suicide Risk:  Minimal: No identifiable suicidal ideation.  Patients presenting with no risk factors but with morbid ruminations; may be classified as minimal risk based on the severity of the depressive symptoms  Principal Problem: Major depressive disorder, recurrent, severe without psychotic features Discharge Diagnoses:  Patient Active Problem List   Diagnosis Date Noted  . Major depressive disorder, recurrent, severe without psychotic features [F33.2]   . Alcohol use disorder, severe, dependence [F10.20] 11/16/2014  . Cocaine use disorder, severe, dependence [F14.20] 11/16/2014  . Acute calculous cholecystitis s/p lap chole 02/09/2014 [K80.00] 02/09/2014  . HTN (hypertension) [I10] 07/25/2013  . Pain in vertebral column [M54.9] 05/05/2013  . Carpal tunnel syndrome [G56.00] 11/18/2012  . Numbness and tingling of both legs below knees [R20.0] 11/18/2012  . Lumbago [M54.5] 11/18/2012  . Anxiety state, unspecified [F41.1] 11/18/2012    Follow-up Information    [facility]   Why:  appt needed prior to d/c   Contact information:   [address] [place], [place] [address] Phone: [phone] Fax: [phone]      Follow up with [facility] On 11/26/2014.   Why:  Assessment on Monday April 4th at 9 am for therapy and medication management services. Please bring ID and insurance card.   Contact information:   [address],  [place],  [place] [address] [phone]       Plan Of Care/Follow-up recommendations:  Activity:  No restrictions Diet:  regular Tests:  as needed Other:  follow up with after care as scheduled  Is patient on multiple antipsychotic therapies at discharge:  No   Has  Patient had three or more failed trials of antipsychotic monotherapy by history:  No  Recommended Plan for Multiple Antipsychotic Therapies: NA    Nyleah Mcginnis md 11/21/2014, 9:42 AM

## 2014-11-21 NOTE — Progress Notes (Signed)
Patient ID: Warden FillersDonna A Bellard, female   DOB: [birth year], 49 y.o.   MRN: [id number] [name] was discharged to lobby with bus pass, AVS, belongings, and home medications. Meds, belongings, and meds were signed for. She was given a 14-day med supply and scripts. Meds/AVS reviewed. Pt verbalized understanding of those and of follow up appointments. Pt given letters. Pt appeared in no acute distress.

## 2014-11-21 NOTE — [facility] Group Notes (Signed)
   [facility] Note  11/21/2014  8:45 AM   Participation Quality: Alert, Appropriate and Oriented  Mood/Affect: Appropriate, anxious  Depression Rating: 5  Anxiety Rating: 7  Thoughts of Suicide: Pt denies SI/HI  Will you contract for safety? Yes  Current AVH: Pt denies  Plan for Discharge/Comments: Pt attended discharge planning group and actively participated in group. CSW provided pt with today's workbook. Patient reports feeling ready to discharge today. She plans to return to the shelter and will follow up with the [facility]. Requesting bus pass at discharge and letters for work/shelter.  Transportation Means: Pt reports access to transportation  Supports: Patient identifies her sister as supportive.  Samuella BruinKristin Vitalia Stough, MSW, [employer]CSWA Clinical Social Worker [facility] [phone]

## 2014-11-26 NOTE — Progress Notes (Signed)
Patient Discharge Instructions:  After Visit Summary (AVS):   Faxed to:  11/26/14 Discharge Summary Note:   Faxed to:  11/26/14 Psychiatric Admission Assessment Note:   Faxed to:  11/26/14 Suicide Risk Assessment - Discharge Assessment:   Faxed to:  11/26/14 Faxed/Sent to the Next Level Care provider:  11/26/14 Next Level Care Provider Has Access to the EMR, 11/26/14  Faxed to [facility] @ [phone] Records provided to [facility] via CH[facility]/Epic access.  Jerelene ReddenSheena E [place], 11/26/2014, 1:35 PM

## 2014-12-03 ENCOUNTER — Ambulatory Visit: Payer: Self-pay | Admitting: Internal Medicine

## 2014-12-04 ENCOUNTER — Emergency Department (HOSPITAL_COMMUNITY): Payer: BLUE CROSS/BLUE SHIELD

## 2014-12-04 ENCOUNTER — Emergency Department (HOSPITAL_COMMUNITY)
Admission: EM | Admit: 2014-12-04 | Discharge: 2014-12-04 | Disposition: A | Discharge status: Home / Self Care | Payer: BLUE CROSS/BLUE SHIELD | Attending: Emergency Medicine | Admitting: Emergency Medicine

## 2014-12-04 ENCOUNTER — Encounter (HOSPITAL_COMMUNITY): Payer: Self-pay | Admitting: Emergency Medicine

## 2014-12-04 DIAGNOSIS — Y9289 Other specified places as the place of occurrence of the external cause: Secondary | ICD-10-CM | POA: Diagnosis not present

## 2014-12-04 DIAGNOSIS — F329 Major depressive disorder, single episode, unspecified: Secondary | ICD-10-CM | POA: Insufficient documentation

## 2014-12-04 DIAGNOSIS — K219 Gastro-esophageal reflux disease without esophagitis: Secondary | ICD-10-CM | POA: Insufficient documentation

## 2014-12-04 DIAGNOSIS — Y9389 Activity, other specified: Secondary | ICD-10-CM | POA: Insufficient documentation

## 2014-12-04 DIAGNOSIS — Z79899 Other long term (current) drug therapy: Secondary | ICD-10-CM | POA: Insufficient documentation

## 2014-12-04 DIAGNOSIS — J45909 Unspecified asthma, uncomplicated: Secondary | ICD-10-CM | POA: Insufficient documentation

## 2014-12-04 DIAGNOSIS — Z791 Long term (current) use of non-steroidal anti-inflammatories (NSAID): Secondary | ICD-10-CM | POA: Diagnosis not present

## 2014-12-04 DIAGNOSIS — Y998 Other external cause status: Secondary | ICD-10-CM | POA: Diagnosis not present

## 2014-12-04 DIAGNOSIS — S52592A Other fractures of lower end of left radius, initial encounter for closed fracture: Secondary | ICD-10-CM | POA: Insufficient documentation

## 2014-12-04 DIAGNOSIS — F419 Anxiety disorder, unspecified: Secondary | ICD-10-CM | POA: Diagnosis not present

## 2014-12-04 DIAGNOSIS — Z880 Allergy status to penicillin: Secondary | ICD-10-CM | POA: Diagnosis not present

## 2014-12-04 DIAGNOSIS — S5291XA Unspecified fracture of right forearm, initial encounter for closed fracture: Secondary | ICD-10-CM

## 2014-12-04 DIAGNOSIS — S6991XA Unspecified injury of right wrist, hand and finger(s), initial encounter: Secondary | ICD-10-CM | POA: Diagnosis present

## 2014-12-04 DIAGNOSIS — I10 Essential (primary) hypertension: Secondary | ICD-10-CM | POA: Diagnosis not present

## 2014-12-04 MED ORDER — HYDROCODONE-ACETAMINOPHEN 5-325 MG PO TABS
2.0000 | ORAL_TABLET | Freq: Once | ORAL | Status: AC
  Administered 2014-12-04: 2 via ORAL
  Filled 2014-12-04: qty 2

## 2014-12-04 MED ORDER — HYDROCODONE-ACETAMINOPHEN 5-325 MG PO TABS: 2.0000 | ORAL_TABLET | ORAL | Status: DC | PRN

## 2014-12-04 NOTE — Discharge Instructions (Signed)
Wear splint as applied until followed up by orthopedics.  Ice for 20 minutes every 2 hours while awake for the next 48 hours.  Follow-up with Dr. Amanda PeaGramig at the orthopedic clinic. The contact information has been provided in this discharge summary. Call to make this appointment for the next 2-3 days.   Radial Fracture You have a broken bone (fracture) of the forearm. This is the part of your arm between the elbow and your wrist. Your forearm is made up of two bones. These are the radius and ulna. Your fracture is in the radial shaft. This is the bone in your forearm located on the thumb side. A cast or splint is used to protect and keep your injured bone from moving. The cast or splint will be on generally for about 5 to 6 weeks, with individual variations. HOME CARE INSTRUCTIONS   Keep the injured part elevated while sitting or lying down. Keep the injury above the level of your heart (the center of the chest). This will decrease swelling and pain.  Apply ice to the injury for 15-20 minutes, 03-04 times per day while awake, for 2 days. Put the ice in a plastic bag and place a towel between the bag of ice and your cast or splint.  Move your fingers to avoid stiffness and minimize swelling.  If you have a plaster or fiberglass cast:  Do not try to scratch the skin under the cast using sharp or pointed objects.  Check the skin around the cast every day. You may put lotion on any red or sore areas.  Keep your cast dry and clean.  If you have a plaster splint:  Wear the splint as directed.  You may loosen the elastic around the splint if your fingers become numb, tingle, or turn cold or blue.  Do not put pressure on any part of your cast or splint. It may break. Rest your cast only on a pillow for the first 24 hours until it is fully hardened.  Your cast or splint can be protected during bathing with a plastic bag. Do not lower the cast or splint into water.  Only take over-the-counter  or prescription medicines for pain, discomfort, or fever as directed by your caregiver. SEEK IMMEDIATE MEDICAL CARE IF:   Your cast gets damaged or breaks.  You have more severe pain or swelling than you did before getting the cast.  You have severe pain when stretching your fingers.  There is a bad smell, new stains and/or pus-like (purulent) drainage coming from under the cast.  Your fingers or hand turn pale or blue and become cold or your loose feeling. Document Released: 01/21/2006 Document Revised: 11/02/2011 Document Reviewed: 04/19/2006 [facility] Patient Information 2015 [employer] [place]. This information is not intended to replace advice given to you by your health care provider. Make sure you discuss any questions you have with your health care provider.

## 2014-12-04 NOTE — Progress Notes (Signed)
Orthopedic Tech Progress Note Patient Details:  Amanda [name] 23-Nov-1965 [id number]  Patient ID: Amanda [name], female   DOB: 23-Nov-1965, 49 y.o.   MRN: [id number] Viewed order from doctor's order list  Amanda [name], Amanda [name] 12/04/2014, 10:05 AM

## 2014-12-04 NOTE — ED Notes (Signed)
Patient request pain medication. MD aware no recommendations at this time.

## 2014-12-04 NOTE — ED Notes (Signed)
Ortho paged for splint application. Pt pacing in the hallway and stating how she is in pain explained to pt that her primary nurse was with a very sick pt. Pt sts, "I'm a sick pt too, I'm in pain." given pt her pain medication and called MD to find out about the wrist splint.
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
## 2014-12-04 NOTE — Progress Notes (Signed)
Orthopedic Tech Progress Note Patient Details:  Warden FillersDonna A Gick May 10, 1966 [id number]  Ortho Devices Type of Ortho Device: Ace wrap, Arm sling, Volar splint Ortho Device/Splint Location: lue Ortho Device/Splint Interventions: Application   Thai Burgueno 12/04/2014, 10:04 AM

## 2014-12-05 ENCOUNTER — Emergency Department (HOSPITAL_COMMUNITY): Payer: BLUE CROSS/BLUE SHIELD

## 2014-12-05 ENCOUNTER — Emergency Department (HOSPITAL_COMMUNITY)
Admission: EM | Admit: 2014-12-05 | Discharge: 2014-12-05 | Disposition: A | Discharge status: Home / Self Care | Payer: BLUE CROSS/BLUE SHIELD | Attending: Emergency Medicine | Admitting: Emergency Medicine

## 2014-12-05 DIAGNOSIS — Y998 Other external cause status: Secondary | ICD-10-CM | POA: Diagnosis not present

## 2014-12-05 DIAGNOSIS — Y9289 Other specified places as the place of occurrence of the external cause: Secondary | ICD-10-CM | POA: Insufficient documentation

## 2014-12-05 DIAGNOSIS — S52592A Other fractures of lower end of left radius, initial encounter for closed fracture: Secondary | ICD-10-CM | POA: Insufficient documentation

## 2014-12-05 DIAGNOSIS — Y9389 Activity, other specified: Secondary | ICD-10-CM | POA: Diagnosis not present

## 2014-12-05 DIAGNOSIS — Z880 Allergy status to penicillin: Secondary | ICD-10-CM | POA: Diagnosis not present

## 2014-12-05 DIAGNOSIS — X58XXXA Exposure to other specified factors, initial encounter: Secondary | ICD-10-CM | POA: Insufficient documentation

## 2014-12-05 DIAGNOSIS — S52502A Unspecified fracture of the lower end of left radius, initial encounter for closed fracture: Secondary | ICD-10-CM

## 2014-12-05 DIAGNOSIS — M6281 Muscle weakness (generalized): Secondary | ICD-10-CM | POA: Diagnosis present

## 2014-12-05 DIAGNOSIS — R29898 Other symptoms and signs involving the musculoskeletal system: Secondary | ICD-10-CM

## 2014-12-05 LAB — CBC WITH DIFFERENTIAL/PLATELET
Basophils Absolute: 0 10*3/uL (ref 0.0–0.1)
Basophils Relative: 0 % (ref 0–1)
Eosinophils Absolute: 0.2 10*3/uL (ref 0.0–0.7)
Eosinophils Relative: 2 % (ref 0–5)
HCT: 37.7 % (ref 36.0–46.0)
Hemoglobin: 12.7 g/dL (ref 12.0–15.0)
Lymphocytes Relative: 38 % (ref 12–46)
Lymphs Abs: 3 10*3/uL (ref 0.7–4.0)
MCH: 34.2 pg — ABNORMAL HIGH (ref 26.0–34.0)
MCHC: 33.7 g/dL (ref 30.0–36.0)
MCV: 101.6 fL — ABNORMAL HIGH (ref 78.0–100.0)
Monocytes Absolute: 0.7 10*3/uL (ref 0.1–1.0)
Monocytes Relative: 9 % (ref 3–12)
Neutro Abs: 3.9 10*3/uL (ref 1.7–7.7)
Neutrophils Relative %: 50 % (ref 43–77)
Platelets: 331 10*3/uL (ref 150–400)
RBC: 3.71 MIL/uL — ABNORMAL LOW (ref 3.87–5.11)
RDW: 12.9 % (ref 11.5–15.5)
WBC: 7.9 10*3/uL (ref 4.0–10.5)

## 2014-12-05 LAB — I-STAT TROPONIN, ED: Troponin i, poc: 0 ng/mL (ref 0.00–0.08)

## 2014-12-05 LAB — COMPREHENSIVE METABOLIC PANEL
ALT: 19 U/L (ref 0–35)
AST: 21 U/L (ref 0–37)
Albumin: 3.7 g/dL (ref 3.5–5.2)
Alkaline Phosphatase: 70 U/L (ref 39–117)
Anion gap: 10 (ref 5–15)
BUN: 9 mg/dL (ref 6–23)
CO2: 25 mmol/L (ref 19–32)
Calcium: 8.9 mg/dL (ref 8.4–10.5)
Chloride: 103 mmol/L (ref 96–112)
Creatinine, Ser: 0.79 mg/dL (ref 0.50–1.10)
GFR calc Af Amer: >90 mL/min (ref >90)
GFR calc non Af Amer: >90 mL/min (ref >90)
Glucose, Bld: 105 mg/dL — ABNORMAL HIGH (ref 70–99)
Potassium: 3.7 mmol/L (ref 3.5–5.1)
Sodium: 138 mmol/L (ref 135–145)
Total Bilirubin: 0.5 mg/dL (ref 0.3–1.2)
Total Protein: 6.4 g/dL (ref 6.0–8.3)

## 2014-12-05 LAB — ETHANOL: Alcohol, Ethyl (B): <5 mg/dL (ref 0–9)

## 2014-12-05 LAB — SALICYLATE LEVEL: Salicylate Lvl: <4 mg/dL (ref 2.8–20.0)

## 2014-12-05 LAB — ACETAMINOPHEN LEVEL: Acetaminophen (Tylenol), Serum: <10 ug/mL — ABNORMAL LOW (ref 10–30)

## 2014-12-05 MED ORDER — HYDROMORPHONE HCL 1 MG/ML IJ SOLN
1.0000 mg | Freq: Once | INTRAMUSCULAR | Status: AC
  Administered 2014-12-05: 1 mg via INTRAVENOUS
  Filled 2014-12-05: qty 1

## 2014-12-05 MED ORDER — MORPHINE SULFATE 4 MG/ML IJ SOLN
4.0000 mg | Freq: Once | INTRAMUSCULAR | Status: AC
  Administered 2014-12-05: 4 mg via INTRAVENOUS
  Filled 2014-12-05: qty 1

## 2014-12-05 NOTE — ED Notes (Signed)
For prior charting see downtime paper chart.

## 2014-12-05 NOTE — Discharge Instructions (Signed)
Fill your vicodin from yesterday.   Follow up with neurology for further testing./  Return to ER if you have severe pain, unable to walk, trouble urinating.
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
## 2014-12-05 NOTE — ED Notes (Signed)
Case manager to fax over coupon for pt medication that was prescribed for pain. Informed pt and she verbalized understanding.
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
## 2014-12-05 NOTE — ED Notes (Signed)
Social worker at bedside.

## 2014-12-05 NOTE — ED Notes (Signed)
Pt in room sleeping.

## 2014-12-06 NOTE — Progress Notes (Signed)
ED CM received a call from [facility] CM about pt with frequent calls requesting "a code" for medicine at her pharmacy  Pt's [facility], [facility] [address], contacted [phone]. CM spoke with Kathie RhodesBetty and informed Kathie RhodesBetty pt was given the Goodrx discount card prior to her leaving [facility].  Kathie RhodesBetty confirmed pt did not bring in discount card with her to the pharmacy.  Kathie RhodesBetty had asked pt about the card but pt became belligerent, screaming about she had a injured wrist  Kathie RhodesBetty informed CM she will educate pt to keep and bring in the card to pharmacy with her each time if needs this medication again.  Cm provided discount card rxbin, rx group and member ID numbers for Kathie RhodesBetty to process vicodin 5/325 mg total of 20 tabs for $7.90 for the pt prior to CM disconnecting the phone call CM signing off

## 2015-01-03 ENCOUNTER — Encounter (HOSPITAL_COMMUNITY): Payer: Self-pay | Admitting: Emergency Medicine

## 2015-01-03 ENCOUNTER — Emergency Department (HOSPITAL_COMMUNITY): Payer: BLUE CROSS/BLUE SHIELD

## 2015-01-03 ENCOUNTER — Emergency Department (HOSPITAL_COMMUNITY)
Admission: EM | Admit: 2015-01-03 | Discharge: 2015-01-04 | Disposition: A | Discharge status: Home / Self Care | Payer: BLUE CROSS/BLUE SHIELD | Attending: Emergency Medicine | Admitting: Emergency Medicine

## 2015-01-03 DIAGNOSIS — M542 Cervicalgia: Secondary | ICD-10-CM | POA: Insufficient documentation

## 2015-01-03 DIAGNOSIS — F329 Major depressive disorder, single episode, unspecified: Secondary | ICD-10-CM | POA: Insufficient documentation

## 2015-01-03 DIAGNOSIS — Z880 Allergy status to penicillin: Secondary | ICD-10-CM | POA: Insufficient documentation

## 2015-01-03 DIAGNOSIS — Z79899 Other long term (current) drug therapy: Secondary | ICD-10-CM | POA: Insufficient documentation

## 2015-01-03 DIAGNOSIS — M546 Pain in thoracic spine: Secondary | ICD-10-CM | POA: Insufficient documentation

## 2015-01-03 DIAGNOSIS — K219 Gastro-esophageal reflux disease without esophagitis: Secondary | ICD-10-CM | POA: Insufficient documentation

## 2015-01-03 DIAGNOSIS — M549 Dorsalgia, unspecified: Secondary | ICD-10-CM

## 2015-01-03 DIAGNOSIS — G8929 Other chronic pain: Secondary | ICD-10-CM | POA: Insufficient documentation

## 2015-01-03 DIAGNOSIS — G629 Polyneuropathy, unspecified: Secondary | ICD-10-CM | POA: Insufficient documentation

## 2015-01-03 DIAGNOSIS — J45909 Unspecified asthma, uncomplicated: Secondary | ICD-10-CM | POA: Insufficient documentation

## 2015-01-03 DIAGNOSIS — I10 Essential (primary) hypertension: Secondary | ICD-10-CM | POA: Insufficient documentation

## 2015-01-03 DIAGNOSIS — F419 Anxiety disorder, unspecified: Secondary | ICD-10-CM | POA: Insufficient documentation

## 2015-01-03 LAB — CBC WITH DIFFERENTIAL/PLATELET
Basophils Absolute: 0 10*3/uL (ref 0.0–0.1)
Basophils Relative: 1 % (ref 0–1)
Eosinophils Absolute: 0.1 10*3/uL (ref 0.0–0.7)
Eosinophils Relative: 1 % (ref 0–5)
HCT: 40.5 % (ref 36.0–46.0)
Hemoglobin: 14.3 g/dL (ref 12.0–15.0)
Lymphocytes Relative: 37 % (ref 12–46)
Lymphs Abs: 3.1 10*3/uL (ref 0.7–4.0)
MCH: 34.5 pg — ABNORMAL HIGH (ref 26.0–34.0)
MCHC: 35.3 g/dL (ref 30.0–36.0)
MCV: 97.8 fL (ref 78.0–100.0)
Monocytes Absolute: 0.8 10*3/uL (ref 0.1–1.0)
Monocytes Relative: 10 % (ref 3–12)
Neutro Abs: 4.3 10*3/uL (ref 1.7–7.7)
Neutrophils Relative %: 51 % (ref 43–77)
Platelets: 264 10*3/uL (ref 150–400)
RBC: 4.14 MIL/uL (ref 3.87–5.11)
RDW: 12.6 % (ref 11.5–15.5)
WBC: 8.3 10*3/uL (ref 4.0–10.5)

## 2015-01-03 LAB — BASIC METABOLIC PANEL
Anion gap: 12 (ref 5–15)
BUN: <5 mg/dL — ABNORMAL LOW (ref 6–20)
CO2: 24 mmol/L (ref 22–32)
Calcium: 9.4 mg/dL (ref 8.9–10.3)
Chloride: 102 mmol/L (ref 101–111)
Creatinine, Ser: 0.82 mg/dL (ref 0.44–1.00)
GFR calc Af Amer: >60 mL/min (ref >60)
GFR calc non Af Amer: >60 mL/min (ref >60)
Glucose, Bld: 89 mg/dL (ref 65–99)
Potassium: 3.5 mmol/L (ref 3.5–5.1)
Sodium: 138 mmol/L (ref 135–145)

## 2015-01-03 LAB — ETHANOL: Alcohol, Ethyl (B): 9 mg/dL — ABNORMAL HIGH (ref <5)

## 2015-01-03 NOTE — ED Notes (Signed)
Pt has returned from CT.  

## 2015-01-03 NOTE — ED Provider Notes (Signed)
CSN: [id number]     Arrival date & time 01/03/15  2142 History   First MD Initiated Contact with Patient 01/03/15 2228     Chief Complaint  Patient presents with  . Back Pain    HPI Patient presents to the emergency room complaining of pain in her upper back radiating up to her neck and her head. Patient states it's a tingling type pain. She feels like there is something wrong internally. She has also been feeling confused and lightheaded. She feels like there is fatigue inside of her spine. She has not been eating or sleeping well. She denies any fevers or chills. No vomiting or diarrhea. She denies any alcohol or drug use. Any hallucinations.  Patient has not fallen recently but has in the last couple of weeks. She is unable to make she had any neck or head injury. Past Medical History  Diagnosis Date  . Depression   . Anxiety   . Carpal tunnel syndrome, bilateral   . GERD (gastroesophageal reflux disease)   . Neuropathy   . Hypertension   . Asthma   . PUD (peptic ulcer disease)    Past Surgical History  Procedure Laterality Date  . Rotator cuff repair Right 2011  . Hammer toe fusion Bilateral 2008  . Carpal tunnel release Right 2003  . Cholecystectomy N/A 02/09/2014    Procedure: LAPAROSCOPIC CHOLECYSTECTOMY WITH INTRAOPERATIVE CHOLANGIOGRAM;  Surgeon: Valarie MerinoMatthew B Martin, MD;  Location: [facility];  Service: General;  Laterality: N/A;   Family History  Problem Relation Age of Onset  . Cancer Mother     pancreatic  . Heart failure Father   . Cancer Paternal Grandmother   . HIV/AIDS Brother    History  Substance Use Topics  . Smoking status: Never Smoker   . Smokeless tobacco: Not on file  . Alcohol Use: Yes     Comment: 6 pack to a case of beer a day   OB History    No data available     Review of Systems  Eyes:       She has noted some spots across her eyes  All other systems reviewed and are negative.     Allergies  Penicillins  Home Medications   Prior to  Admission medications   Medication Sig Start Date End Date Taking? Authorizing Provider  cyclobenzaprine (FLEXERIL) 5 MG tablet Take 1 tablet (5 mg total) by mouth 3 (three) times daily as needed for muscle spasms. 11/21/14   Adonis BrookSheila Agustin, NP  gabapentin (NEURONTIN) 300 MG capsule Take one tab (300 mg) in the am.  Then take 2 tabs (600mg ) in the evening. 11/21/14   Adonis BrookSheila Agustin, NP  HYDROcodone-acetaminophen (NORCO) 5-325 MG per tablet Take 2 tablets by mouth every 4 (four) hours as needed. 12/04/14   Geoffery Lyonsouglas Delo, MD  lisinopril (PRINIVIL,ZESTRIL) 10 MG tablet Take 1 tablet (10 mg total) by mouth daily. 11/21/14   Adonis BrookSheila Agustin, NP  mirtazapine (REMERON) 15 MG tablet Take 1 tablet (15 mg total) by mouth at bedtime. 11/21/14   Adonis BrookSheila Agustin, NP  naltrexone (DEPADE) 50 MG tablet Take 0.5 tablets (25 mg total) by mouth daily. 11/21/14   Adonis BrookSheila Agustin, NP  nicotine (NICODERM CQ - DOSED IN MG/24 HOURS) 21 mg/24hr patch Place 1 patch (21 mg total) onto the skin daily. 11/21/14   Adonis BrookSheila Agustin, NP  pantoprazole (PROTONIX) 40 MG tablet Take 2 tablets (80 mg total) by mouth daily. 11/21/14   Adonis BrookSheila Agustin, NP  sucralfate (CARAFATE) 1  G tablet Take 1 tablet (1 g total) by mouth 3 (three) times daily. 11/21/14   Adonis BrookSheila Agustin, NP  traZODone (DESYREL) 300 MG tablet Take 1 tablet (300 mg total) by mouth at bedtime. 11/21/14   Adonis BrookSheila Agustin, NP   BP 134/73 mmHg  Pulse 86  Temp(Src) 98.6 F (37 C) (Oral)  Resp 15  Ht 5\' 8"  (1.727 m)  Wt 174 lb 1.6 oz (78.971 kg)  BMI 26.48 kg/m2  SpO2 100%  LMP 12/05/2014 Physical Exam  Constitutional: She appears well-developed and well-nourished. No distress.  HENT:  Head: Normocephalic and atraumatic.  Right Ear: External ear normal.  Left Ear: External ear normal.  Mouth/Throat: No oropharyngeal exudate.  Eyes: Conjunctivae are normal. Right eye exhibits no discharge. Left eye exhibits no discharge. No scleral icterus.  Neck: Normal range of motion. Neck  supple. No tracheal deviation present.  Tenderness palpation posterior cervical spine and paraspinal region  Cardiovascular: Normal rate, regular rhythm and intact distal pulses.   Pulmonary/Chest: Effort normal and breath sounds normal. No stridor. No respiratory distress. She has no wheezes. She has no rales.  Abdominal: Soft. Bowel sounds are normal. She exhibits no distension. There is no tenderness. There is no rebound and no guarding.  Musculoskeletal: She exhibits no edema or tenderness.  Lymphadenopathy:    She has no cervical adenopathy.  Neurological: She is alert. She has normal strength. No cranial nerve deficit (no facial droop, extraocular movements intact, no slurred speech) or sensory deficit. She exhibits normal muscle tone. She displays no seizure activity. Coordination normal.  Skin: Skin is warm and dry. No rash noted.  Psychiatric: She has a normal mood and affect. Her speech is tangential. Her speech is not rapid and/or pressured.  Patient is somewhat tangential in her speech pattern, she repetitively is telling me that she is a strong woman, bizarre affect  Nursing note and vitals reviewed.   ED Course  Procedures (including critical care time) Labs Review Labs Reviewed  CBC WITH DIFFERENTIAL/PLATELET - Abnormal; Notable for the following:    MCH 34.5 (*)    All other components within normal limits  BASIC METABOLIC PANEL  ETHANOL  URINALYSIS, ROUTINE W REFLEX MICROSCOPIC    Imaging Review Ct Head Wo Contrast  01/03/2015   CLINICAL DATA:  Headache, neck pain and paresthesias.  No trauma.  EXAM: CT HEAD WITHOUT CONTRAST  CT CERVICAL SPINE WITHOUT CONTRAST  TECHNIQUE: Multidetector CT imaging of the head and cervical spine was performed following the standard protocol without intravenous contrast. Multiplanar CT image reconstructions of the cervical spine were also generated.  COMPARISON:  12/05/2014  FINDINGS: CT HEAD FINDINGS  There is no intracranial hemorrhage,  mass or evidence of acute infarction. Gray matter and white matter appear normal. Brainstem and posterior fossa are unremarkable. The ventricles and basal cisterns appear normal.  The bony structures are intact. The visible portions of the paranasal sinuses are clear.  CT CERVICAL SPINE FINDINGS  The vertebral column, pedicles and facet articulations are intact. There is no evidence of acute fracture. No acute soft tissue abnormalities are evident.  No significant arthritic changes are evident.  IMPRESSION: Normal brain. Negative for acute cervical spine fracture. No significant cervical spine arthrosis.   Electronically Signed   By: Ellery Plunkaniel R Mitchell M.D.   On: 01/03/2015 23:44   Ct Cervical Spine Wo Contrast  01/03/2015   CLINICAL DATA:  Headache, neck pain and paresthesias.  No trauma.  EXAM: CT HEAD WITHOUT CONTRAST  CT CERVICAL SPINE  WITHOUT CONTRAST  TECHNIQUE: Multidetector CT imaging of the head and cervical spine was performed following the standard protocol without intravenous contrast. Multiplanar CT image reconstructions of the cervical spine were also generated.  COMPARISON:  12/05/2014  FINDINGS: CT HEAD FINDINGS  There is no intracranial hemorrhage, mass or evidence of acute infarction. Gray matter and white matter appear normal. Brainstem and posterior fossa are unremarkable. The ventricles and basal cisterns appear normal.  The bony structures are intact. The visible portions of the paranasal sinuses are clear.  CT CERVICAL SPINE FINDINGS  The vertebral column, pedicles and facet articulations are intact. There is no evidence of acute fracture. No acute soft tissue abnormalities are evident.  No significant arthritic changes are evident.  IMPRESSION: Normal brain. Negative for acute cervical spine fracture. No significant cervical spine arthrosis.   Electronically Signed   By: Ellery Plunk M.D.   On: 01/03/2015 23:44     MDM   Pt's symptoms are somewhat vague and multiple.  I suspect  pt has some baseline psychiatric illness but she is complaining of head and neck pain.  She states she has fallen recently.  I suspect her workup will be normal but plan on CT head and neck to evaluate for injury considering her complaints.   Check labs.  Dr Ranae Palms will follow up on results   Linwood Dibbles, MD 01/03/15 2348

## 2015-01-03 NOTE — ED Notes (Addendum)
Pt c/o lower back pain radiating up to her head.  Pt has multi. Complaints,  St's she has been feeling  Confused, st's she feels tired in her head and in her spine.  Pt st's she has not eaten or slept in several days
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
## 2015-01-04 LAB — URINALYSIS, ROUTINE W REFLEX MICROSCOPIC
Bilirubin Urine: NEGATIVE
Glucose, UA: NEGATIVE mg/dL
Hgb urine dipstick: NEGATIVE
Ketones, ur: 15 mg/dL — AB
Leukocytes, UA: NEGATIVE
Nitrite: NEGATIVE
Protein, ur: 30 mg/dL — AB
Specific Gravity, Urine: 1.024 (ref 1.005–1.030)
Urobilinogen, UA: 1 mg/dL (ref 0.0–1.0)
pH: 5.5 (ref 5.0–8.0)

## 2015-01-04 LAB — URINE MICROSCOPIC-ADD ON

## 2015-01-04 MED ORDER — IBUPROFEN 200 MG PO TABS
600.0000 mg | ORAL_TABLET | Freq: Once | ORAL | Status: AC
  Administered 2015-01-04: 600 mg via ORAL
  Filled 2015-01-04: qty 3

## 2015-01-04 NOTE — ED Notes (Signed)
Pt called out, stating that she was upset because the doctor wouldn't listen to her, and that she hurts. Pt states that she remembered that when she gets up in the morning it hurts to walk. Pt states "I'm not going to lay here in pain".

## 2015-01-04 NOTE — Discharge Instructions (Signed)
°Emergency Department Resource Guide °1) Find a Doctor and Pay Out of Pocket °Although you won't have to find out who is covered by your insurance plan, it is a good idea to ask around and get recommendations. You will then need to call the office and see if the doctor you have chosen will accept you as a new patient and what types of options they offer for patients who are self-pay. Some doctors offer discounts or will set up payment plans for their patients who do not have insurance, but you will need to ask so you aren't surprised when you get to your appointment. ° °2) Contact Your Local Health Department °Not all health departments have doctors that can see patients for sick visits, but many do, so it is worth a call to see if yours does. If you don't know where your local health department is, you can check in your phone book. The CDC also has a tool to help you locate your state's health department, and many state websites also have listings of all of their local health departments. ° °3) Find a Walk-in Clinic °If your illness is not likely to be very severe or complicated, you may want to try a walk in clinic. These are popping up all over the country in pharmacies, drugstores, and shopping centers. They're usually staffed by nurse practitioners or physician assistants that have been trained to treat common illnesses and complaints. They're usually fairly quick and inexpensive. However, if you have serious medical issues or chronic medical problems, these are probably not your best option. ° °No Primary Care Doctor: °- Call Health Connect at  832-8000 - they can help you locate a primary care doctor that  accepts your insurance, provides certain services, etc. °- Physician Referral Service- 1-800-533-3463 ° °Chronic Pain Problems: °Organization         Address  Phone   Notes  °[facility]  (336) 297-2271 Patients need to be referred by their primary care doctor.  ° °Medication  Assistance: °Organization         Address  Phone   Notes  °[place] [facility] [address] °[place], [place] [address] (336) 641-8030 --Must be a resident of [place] °-- Must have NO insurance coverage whatsoever (no Medicaid/ Medicare, etc.) °-- The pt. MUST have a primary care doctor that directs their care regularly and follows them in the community °  °[facility]  (866) 331-1348   °[employer]  (888) 892-1162   ° °Agencies that provide inexpensive medical care: °Organization         Address  Phone   Notes  °[facility]  (336) 832-8035   °[facility]    (336) 832-7272   °[facility] [address] °[place], [place] [address] (336) 832-4777   °[facility] [address], °[place] (336) 271-4999   °[facility]    (336) 373-0678   °[facility]    (336) 272-1050   °[facility] ° [address], [place] Phone:  (336) 832-4444, Fax:  (336) 832-4440 Hours of Operation:  9 am - 6 pm, M-F.  Also accepts Medicaid/Medicare and self-pay.  °[facility] ° [address], [place] Phone: (336) 832-3150, Fax: (336) [phone]. Hours of Operation:  8:30 am - 5:30 pm, M-F.  Also accepts Medicaid and self-pay.  °[facility] [place] [address], [place] Phone: (336) 878-6027   °[facility] [address], [place], [place] (336)723-1848, Ext. 123 Mondays & Thursdays: 7-9 AM.  First 15 patients are seen on a first come, first serve basis. °  ° °Medicaid-accepting [place] Providers: ° °Organization         Address  Phone   Notes  °[facility] [address], [place] (336) 641-2100 Also accepts self-pay patients.  °[facility] [address], [place] ° (336) 856-9996   °[facility] [address], [place]  (336) 288-8857   °[facility] [address] [place] [address], [place] (336) 299-7000   °Veita Bland [address], [place]  ° (336) 373-1557 Only accepts [place] Access Medicaid patients after they have their name applied to their card.  ° °Self-Pay (no insurance) in [place]: ° °Organization         Address  Phone   Notes  °Sickle Cell Patients, [facility] [address], [place] (336) 832-1970   °[facility] [address], [place] (336) 832-4400   °[facility] ° [address], [place] (336) 992-4800   °[facility]/Dr. Osei-Bonsu ° [address] [place] [address], [place] or [address], [place] (336) 841-8500 Phone number for both [place] and [place] locations is the same.  °[facility] [address], [place] (336) 299-0000   °[facility] [address] [place] [address], [place] or [address] (336) 852-7530 °(336) 878-2260   °[facility] [address], [place] (336) 350-1642, phone; (336) 294-5005, fax Sees patients 1st and 3rd Saturday of every month.  Must not qualify for public or private insurance (i.e. Medicaid, Medicare, [place] Health Choice, Veterans' Benefits) • Household income should be no more than 200% of the poverty level •The clinic cannot treat you if you are pregnant or think you are pregnant • Sexually transmitted diseases are not treated at the clinic.  ° ° °Dental Care: °Organization         Address  Phone  Notes  °[place] [facility] [address], [place] (336) 641-6152 Accepts children up to age 21 who are enrolled in Medicaid or [place] Health Choice; pregnant women with a Medicaid card; and children who have applied for Medicaid or [place] Health Choice, but were declined, whose parents can pay a reduced fee at time of service.  °[place]  [facility] [place]  [address], [place] (336) 641-7733 Accepts children up to age 21 who are enrolled in Medicaid or [place] Health Choice; pregnant women with a Medicaid card; and children who have applied for Medicaid or [place] Health Choice, but were declined, whose parents can pay a reduced fee at time of service.  °[facility] ° [address], [place] (336) 641-4533 Patients are seen by appointment only. Walk-ins are not accepted. [facility] will see patients 18 years of age and older. °Monday - Tuesday (8am-5pm) °Most Wednesdays (8:30-5pm) °$30 per visit, cash only  °[facility] ° [address], [place] (336) 641-4533 Patients are seen by appointment only. Walk-ins are not accepted. [facility] will see patients 18 years of age and older. °One   Wednesday Evening (Monthly: Volunteer Based).  $30 per visit, cash only  °[facility]  (919) 537-3737 for adults; Children under age 4, call Graduate Pediatric Dentistry at ([phone]) [phone]. Children aged 4-14, please call (919) [phone] to request a pediatric application. ° Dental services are provided in all areas of dental care including fillings, crowns and bridges, complete and partial dentures, implants, gum treatment, root canals, and extractions. Preventive care is also provided. Treatment is provided to both adults and children. °Patients are selected via a lottery and there is often a waiting list. °  °[facility] [address], °[place] ° (336) 763-8833 [email or web address] °  °[facility] [address], [place], [place] (336)723-1848, Ext. 123 Second and Fourth Thursday of each month, opens at 6:30 AM; Clinic ends at 9 AM.  Patients are seen on a first-come first-served basis, and a limited number are seen during each clinic.  ° °[facility] ° [address], [place], [place] (336) 723-7904    Eligibility Requirements °You must have lived in [place], [place], or [place] counties for at least the last three months. °  You cannot be eligible for state or federal sponsored healthcare insurance, including Veterans Administration, Medicaid, or Medicare. °  You generally cannot be eligible for healthcare insurance through your employer.  °  How to apply: °Eligibility screenings are held every Tuesday and Wednesday afternoon from 1:00 pm until 4:00 pm. You do not need an appointment for the interview!  °[facility] [address], [place], [place] 336-631-2330   °[facility]  336-342-8273   °[facility]  336-703-3100   °[facility]  336-570-6415   ° °Behavioral Health Resources in the Community: °Intensive Outpatient Programs °Organization         Address  Phone  Notes  °[facility] [address], [place], [place] 336-878-6098   °[facility] [address], [place], [place] 336-832-9800   °[facility] [address], [place], [place] ° 336-882-2125   °[facility] [address],  °[place], [place] 1-800-853-5163 or 336-641-4981   °Substance Abuse Resources °Organization         Address  Phone  Notes  °[facility]  336-882-2125   °[facility]  336-784-9470   °[facility]  336-285-9073   °[facility]  336-845-3988   °Residential & Outpatient Substance Abuse Program  1-800-659-3381   °Psychological Services °Organization         Address  Phone  Notes  °[facility]  336- 832-9600   °[facility]  336- 378-7881   °[facility] [address], [place] 1-800-853-5163 or 336-641-4981   ° °Mobile Crisis Teams °Organization         Address  Phone  Notes  °[facility], Mobile Crisis Care Unit  1-877-626-1772   °Assertive °[facility] ° [address]  [place], [place] 336-834-9664   °Sharon DeEsch [address] °[place] 336-554-5454   ° °Self-Help/Support Groups °Organization         Address  Phone             Notes  °[facility]  - variety of support groups  336- 373-1402 Call for more information  °Narcotics Anonymous (NA), [facility] [address], °[place]  2 meetings at this location  ° °  Residential Treatment Programs Organization         Address  Phone  Notes  [facility] [address],    [place]  [phone]   [facility]  [address], [place] [phone], [place], [place] [phone]   [facility] [address], [place] [phone] Admissions: 8am-3pm M-F  [facility] [address]    [place], [place] [phone])[phone]   [facility] [address], [place], [place] [phone]   [facility] [address]  [place], [place] [phone]   [facility] Intensive Outpatient [address], [place], [place] [phone]   [facility] ([facility] [address]  [place], [place] [phone] or ([phone]   [facility] ([facility]) [address] [place], [place] [phone] Accepts Medicaid  [facility] [address]  [place] [phone] Substance Abuse/Addiction Treatment   [facility]         Address  Phone  Notes  [facility]  (901)825-5065(888) [phone]   Angie FavaJulie Brannon, PhD [address] [place], [place]   [phone](336) (609) [phone] or [phone](336) [phone]   [facility]   [address] [place], [place] [phone](336) [phone]   [facility] [address], [place], [place] ([phone](336) [phone] Insurance/Medicaid/sponsorship through [facility]  [facility] [address]                                    [place], [place] [phone]) [phone] Therapy/tele-psych/case    [facility] [address]   [place], [place] [phone]) [phone]    Dr. Lolly MustacheArfeen  [phone](336) [phone]   [facility] [place] [facility]  [employer] [facility] 1) [address], [place] 2) [address], [place] 3)  [address], [place] ([phone](336) [phone](336) [phone]  [phone](336) [phone]   [facility] Child Abuse Hotline 804-169-5133(336) [phone] or [phone](336) [phone] (After Hours)       Back Pain, Adult Low back pain is very common. About 1 in 5 people have back pain.The cause of low back pain is rarely dangerous. The pain often gets better over time.About half of people with a sudden onset of back pain feel better in just 2 weeks. About 8 in 10 people feel better by 6 weeks.  CAUSES Some common causes of back pain include:  Strain of the muscles or ligaments supporting the spine.  Wear and tear (degeneration) of the spinal discs.  Arthritis.  Direct injury to the back. DIAGNOSIS Most of the time, the direct cause of low back pain is not known.However, back pain can be treated effectively even when the exact cause of the pain is unknown.Answering your caregiver's questions about your overall health and symptoms is one of the most accurate ways to make sure the cause of your pain is not dangerous. If your caregiver needs more information, he or she may order lab work or imaging tests (X-rays or MRIs).However, even if imaging tests show changes in your back, this usually does not require surgery. HOME CARE INSTRUCTIONS For many people, back pain returns.Since low back pain is rarely dangerous, it is often a condition that people can learn to [facility] their own.   Remain active. It is stressful on the back to sit or stand in one place.  Do not sit, drive, or stand in one place for more than 30 minutes at a time. Take short walks on level surfaces as soon as pain allows.Try to increase the length of time you walk each day.  Do not stay in bed.Resting more than 1 or 2 days  can delay your recovery.  Do not avoid exercise or work.Your body is made to move.It is not dangerous to be active, even though your back may hurt.Your back will likely heal faster if you return to being active before your pain is gone.  Pay attention to your body when you bend and lift. Many people have less discomfortwhen lifting if they bend their knees, keep the load close to their bodies,and avoid twisting. Often, the most comfortable positions are those that put less stress on your recovering back.  Find a comfortable position to sleep. Use a firm mattress and lie on your side with your knees slightly bent. If you lie on your back, put a pillow under your knees.  Only take over-the-counter or prescription medicines as directed by your caregiver. Over-the-counter medicines to reduce pain and inflammation are often the most helpful.Your caregiver may prescribe muscle relaxant drugs.These medicines help dull your pain so you can more quickly return to your normal activities and healthy exercise.  Put ice on the injured area.  Put ice in a plastic bag.  Place a towel between your skin and the bag.  Leave the ice on for 15-20 minutes, 03-04 times a day for the first 2 to 3 days. After that, ice and heat may be alternated to reduce pain and spasms.  Ask your caregiver about trying back exercises and gentle massage. This may be of some benefit.  Avoid feeling anxious or stressed.Stress increases muscle tension and can worsen back pain.It is important to recognize when you are anxious or stressed and learn ways to manage it.Exercise is a great option. SEEK MEDICAL CARE IF:  You have pain that is not relieved with rest or medicine.  You have pain that does not improve in 1 week.  You have new symptoms.  You are generally not feeling well. SEEK IMMEDIATE MEDICAL CARE IF:   You have pain that radiates from your back into your legs.  You develop new bowel or bladder control  problems.  You have unusual weakness or numbness in your arms or legs.  You develop nausea or vomiting.  You develop abdominal pain.  You feel faint. Document Released: 08/10/2005 Document Revised: 02/09/2012 Document Reviewed: 12/12/2013 [facility] Patient Information 2015 [place]. This information is not intended to replace advice given to you by your health care provider. Make sure you discuss any questions you have with your health care provider.

## 2015-01-04 NOTE — ED Notes (Signed)
Pt verbalized understanding of d/c instructions and has no further questions.  

## 2015-02-12 ENCOUNTER — Ambulatory Visit (HOSPITAL_BASED_OUTPATIENT_CLINIC_OR_DEPARTMENT_OTHER): Payer: Self-pay | Admitting: Family Medicine

## 2015-02-12 MED ORDER — CANAGLIFLOZIN 100 MG PO TABS: 100.0000 mg | ORAL_TABLET | Freq: Every day | ORAL | Status: DC

## 2015-02-12 MED ORDER — OMEPRAZOLE 20 MG PO CPDR: 20.0000 mg | DELAYED_RELEASE_CAPSULE | Freq: Every day | ORAL | Status: DC

## 2015-02-12 MED ORDER — METFORMIN HCL 500 MG PO TABS: 500.0000 mg | ORAL_TABLET | Freq: Two times a day (BID) | ORAL | Status: DC

## 2015-02-12 NOTE — Progress Notes (Deleted)
Patient ID: Amanda [name], female   DOB: [birth year], 49 y.o.   MRN: [id number]   Hertha Gergen, is a 49 y.o. female  [id number]:[id number]  [id number]:[id number]  DOB - 11-21-1965  CC: No chief complaint on file.     Progress started in error, patient was a no show HPI: Amanda [name] is a 49 y.o. female here today to establish medical care. Error (no show)   Allergies  Allergen Reactions  . Penicillins Anaphylaxis   Past Medical History  Diagnosis Date  . Depression   . Anxiety   . Carpal tunnel syndrome, bilateral   . GERD (gastroesophageal reflux disease)   . Neuropathy   . Hypertension   . Asthma   . PUD (peptic ulcer disease)    Current Outpatient Prescriptions on File Prior to Visit  Medication Sig Dispense Refill  . cyclobenzaprine (FLEXERIL) 5 MG tablet Take 1 tablet (5 mg total) by mouth 3 (three) times daily as needed for muscle spasms. (Patient not taking: Reported on 01/04/2015) 30 tablet 0  . gabapentin (NEURONTIN) 300 MG capsule Take one tab (300 mg) in the am.  Then take 2 tabs ( ) in the evening. (Patient not taking: Reported on 01/04/2015) 90 capsule 0  . HYDROcodone-acetaminophen (NORCO) 5-325 MG per tablet Take 2 tablets by mouth every 4 (four) hours as needed. (Patient not taking: Reported on 01/04/2015) 20 tablet 0  . lisinopril (PRINIVIL,ZESTRIL) 10 MG tablet Take 1 tablet (10 mg total) by mouth daily. (Patient not taking: Reported on 01/04/2015) 30 tablet 0  . mirtazapine (REMERON) 15 MG tablet Take 1 tablet (15 mg total) by mouth at bedtime. (Patient not taking: Reported on 01/04/2015) 30 tablet 0  . naltrexone (DEPADE) 50 MG tablet Take 0.5 tablets (25 mg total) by mouth daily. (Patient not taking: Reported on 01/04/2015) 30 tablet 0  . nicotine (NICODERM CQ - DOSED IN MG/24 HOURS) 21 mg/24hr patch Place 1 patch (21 mg total) onto the skin daily. (Patient not taking: Reported on 01/04/2015) 28 patch 0  . pantoprazole (PROTONIX) 40 MG tablet Take 2 tablets (80 mg total)  by mouth daily. (Patient not taking: Reported on 01/04/2015) 60 tablet 0  . sucralfate (CARAFATE) 1 G tablet Take 1 tablet (1 g total) by mouth 3 (three) times daily. (Patient not taking: Reported on 01/04/2015) 90 tablet 0  . traZODone (DESYREL) 300 MG tablet Take 1 tablet (300 mg total) by mouth at bedtime. (Patient not taking: Reported on 01/04/2015) 30 tablet 0   No current facility-administered medications on file prior to visit.   Family History  Problem Relation Age of Onset  . Cancer Mother     pancreatic  . Heart failure Father   . Cancer Paternal Grandmother   . HIV/AIDS Brother    History   Social History  . Marital Status: Single    Spouse Name: N/A  . Number of Children: N/A  . Years of Education: N/A   Occupational History  . [name]   Social History Main Topics  . Smoking status: Never Smoker   . Smokeless tobacco: Not on file  . Alcohol Use: Yes     Comment: 6 pack to a case of beer a day  . Drug Use: Yes    Special: "Crack" cocaine  . Sexual Activity: No   Other Topics Concern  . Not on file   Social History Narrative   Pt lives at home alone. She has a HS education level and does  not have any children.    She drinks 2 cups of caffeine daily.    Review of Systems: Constitutional: Negative for fever, chills, appetite change, weight loss,  fatigue. HENT: Negative for ear pain, ear discharge.nose bleeds Eyes: Negative for pain, discharge, redness, itching and visual disturbance. Neck: Negative for pain, stiffness Respiratory: Negative for cough, shortness of breath,   Cardiovascular: Negative for chest pain, palpitations and leg swelling. Gastrointestinal: Negative for abdominal distention, abdominal pain, nausea, vomiting, diarrhea, constipations Genitourinary: Negative for dysuria, urgency, frequency, hematuria, flank pain,  Musculoskeletal: Negative for back pain, joint pain, joint  swelling, arthralgia and gait problem.Negative for  weakness. Neurological: Negative for dizziness, tremors, seizures, syncope,   light-headedness, numbness and headaches.  Hematological: Negative for easy bruising or bleeding Psychiatric/Behavioral: Negative for depression, anxiety, decreased concentration, confusion   Objective:  There were no vitals filed for this visit.  Physical Exam: Constitutional: Patient appears well-developed and well-nourished. No distress. HENT: Normocephalic, atraumatic, External right and left ear normal. Oropharynx is clear and moist.  Eyes: Conjunctivae and EOM are normal. PERRLA, no scleral icterus. Neck: Normal ROM. Neck supple. No lymphadenopathy, No thyromegaly. CVS: RRR, S1/S2 +, no murmurs, no gallops, no rubs Pulmonary: Effort and breath sounds normal, no stridor, rhonchi, wheezes, rales.  Abdominal: Soft. Normoactive BS,, no distension, tenderness, rebound or guarding.  Musculoskeletal: Normal range of motion. No edema and no tenderness.  Neuro: Alert.Normal muscle tone coordination. Non-focal Skin: Skin is warm and dry. No rash noted. Not diaphoretic. No erythema. No pallor. Psychiatric: Normal mood and affect. Behavior, judgment, thought content normal.   A1C 8. Glucose 233.  Lab Results  Component Value Date   WBC 8.3 01/03/2015   HGB 14.3 01/03/2015   HCT 40.5 01/03/2015   MCV 97.8 01/03/2015   PLT 264 01/03/2015   Lab Results  Component Value Date   CREATININE 0.82 01/03/2015   BUN <5* 01/03/2015   NA 138 01/03/2015   K 3.5 01/03/2015   CL 102 01/03/2015   CO2 24 01/03/2015    Lab Results  Component Value Date   HGBA1C 5.1 11/18/2012   Lipid Panel  No results found for: CHOL, TRIG, HDL, CHOLHDL, VLDL, LDLCALC     Assessment and plan:   There are no diagnoses linked to this encounter.  No Follow-up on file.  The patient was given clear instructions to go to ER or return to medical center if symptoms don't improve, worsen or new problems develop. The patient  verbalized understanding. The patient was told to call to get lab results if they haven't heard anything in the next week.     This note has been created with Education officer, environmental. Any transcriptional errors are unintentional.    Henrietta Hoover, MSN, FNP-BC [facility] [place], [place] [phone]   02/12/2015, 11:41 AM

## 2015-02-12 NOTE — Progress Notes (Signed)
Erroneous encounter

## 2015-04-17 ENCOUNTER — Other Ambulatory Visit (HOSPITAL_COMMUNITY): Payer: Self-pay | Admitting: *Deleted

## 2015-04-17 DIAGNOSIS — Z1231 Encounter for screening mammogram for malignant neoplasm of breast: Secondary | ICD-10-CM

## 2015-04-26 ENCOUNTER — Ambulatory Visit (HOSPITAL_COMMUNITY)
Admission: RE | Admit: 2015-04-26 | Discharge: 2015-04-26 | Disposition: A | Discharge status: Home / Self Care | Payer: Self-pay | Source: Ambulatory Visit | Attending: *Deleted | Admitting: *Deleted

## 2015-04-26 DIAGNOSIS — Z1231 Encounter for screening mammogram for malignant neoplasm of breast: Secondary | ICD-10-CM

## 2015-06-17 ENCOUNTER — Emergency Department (HOSPITAL_COMMUNITY)
Admission: EM | Admit: 2015-06-17 | Discharge: 2015-06-17 | Disposition: A | Discharge status: Home / Self Care | Payer: Self-pay | Attending: Emergency Medicine | Admitting: Emergency Medicine

## 2015-06-17 ENCOUNTER — Encounter (HOSPITAL_COMMUNITY): Payer: Self-pay | Admitting: Emergency Medicine

## 2015-06-17 DIAGNOSIS — F1910 Other psychoactive substance abuse, uncomplicated: Secondary | ICD-10-CM

## 2015-06-17 DIAGNOSIS — J45909 Unspecified asthma, uncomplicated: Secondary | ICD-10-CM | POA: Insufficient documentation

## 2015-06-17 DIAGNOSIS — Z880 Allergy status to penicillin: Secondary | ICD-10-CM | POA: Insufficient documentation

## 2015-06-17 DIAGNOSIS — Z79899 Other long term (current) drug therapy: Secondary | ICD-10-CM | POA: Insufficient documentation

## 2015-06-17 DIAGNOSIS — F419 Anxiety disorder, unspecified: Secondary | ICD-10-CM | POA: Insufficient documentation

## 2015-06-17 DIAGNOSIS — F3170 Bipolar disorder, currently in remission, most recent episode unspecified: Secondary | ICD-10-CM

## 2015-06-17 DIAGNOSIS — K219 Gastro-esophageal reflux disease without esophagitis: Secondary | ICD-10-CM | POA: Insufficient documentation

## 2015-06-17 DIAGNOSIS — F329 Major depressive disorder, single episode, unspecified: Secondary | ICD-10-CM | POA: Insufficient documentation

## 2015-06-17 DIAGNOSIS — G629 Polyneuropathy, unspecified: Secondary | ICD-10-CM | POA: Insufficient documentation

## 2015-06-17 DIAGNOSIS — Z8711 Personal history of peptic ulcer disease: Secondary | ICD-10-CM | POA: Insufficient documentation

## 2015-06-17 DIAGNOSIS — I119 Hypertensive heart disease without heart failure: Secondary | ICD-10-CM

## 2015-06-17 DIAGNOSIS — I10 Essential (primary) hypertension: Secondary | ICD-10-CM

## 2015-06-17 LAB — CBG MONITORING, ED: Glucose-Capillary: 73 mg/dL (ref 65–99)
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
## 2015-06-17 NOTE — Congregational Nurse Program (Signed)
For 1000 and 1010 notes see nursing assessment.  1010 - (cont) client met with Terrance Pleasants and Kynsli Haapala, RN in Amanda [name]'s office.  Attempt by Terrence Pleasants and Marija Calamari, RN to verbally de-escalate client.  Client continued to rage.  Client stated, "Nobody is helping me here." Client given emotional reassurance and encouraged to take her medications for today.  Client given water to take Clonidine 0.1 mg tablet, Gabapentin 300 mg, and Atenolol 50 mg.  Client consumed these medications.  Client diaphoretic and jugular vein distended.  Amanda Icard, RN suggested to get her blood pressure taken.  Client agreed.  Client continued to become easily agitated but cooperated with blood pressure being taken.  Left arm BP 200/156 at 1015, BP 181/114 at 1030 left arm.  Client denies headache or dizziness.  Client mood is calm.  Skin is dry.  Client alert and oriented times 4. RN suggested to client that she needed to go to the ED for treatment.  Client agreed.  911 called per Carla Smith.  Michelle Kennedy and Terrence Pleasants notified client will be going to ED.   

## 2015-06-17 NOTE — ED Notes (Signed)
Case management at bedside.

## 2015-06-17 NOTE — ED Notes (Signed)
CBG 73 

## 2015-06-17 NOTE — ED Notes (Signed)
Pt very upset at being "pushed out", explained to pt that was not our intentions however she has been discharged by her provider and there is nothing further we could do here for her today.

## 2015-06-17 NOTE — ED Notes (Signed)
Pt has called out 3 times stating her "feet feel cold and weird." Upon assessment toes are cool to touch, pedal pulses strong, no obvious deformity.

## 2015-06-17 NOTE — ED Notes (Signed)
Pt requesting bus pass, crackers and peanut butter.

## 2015-06-17 NOTE — ED Notes (Signed)
Pt has been given her discharge instructions, however continues to lay in the bed. Explained to pt that she will need to get dressed and come out to the nurses station for her bus bass.

## 2015-06-17 NOTE — ED Notes (Signed)
Pt presents to ED with multiple complaints. Pt c/o back and neck pain x 4 days. Pt told EMS she had a fall but denies injury to this RN. Pt also c/o nerve pain in feet and hands. Pt has history of neuropathy. Pt sts "my feet are numb." Pt can feel RN palpate feet. Pt sts she has difficulty walking due to this. No difficulty noted upon arrival to ED. Pt also c/o high BP [facility]. Pt sts she was very angry at the time it was taken. Pt's BP 136/78 at this time. Pt also c/o migraines x "forever now."  Pt has hx of drug and alcohol abuse and sts "I've been struggling lately."

## 2015-06-17 NOTE — ED Notes (Signed)
Per PT[facility], Pt from [facility], Pt had been having anger issues this morning as well as issues with high BP. 200/156 for care center. 180/120 for PTAR on arrival.  Pt has hx of drug and alcohol abuse. Last time pt had alcohol was 4 days ago. Pt told resource center that she had used cocaine in the last couple of days but denied with PTAR. Pt c/o back pain x months and c/o generalized discomfort. Pt also sts that yesterday she had a fall in the shower. Pt cbg 57 and refused instant glucose. A&Ox4 and ambulatory.

## 2015-06-17 NOTE — ED Notes (Signed)
Went to discharge pt, pt requesting medication/prescription for her nasal congestion.  Made [facility] EDP aware, states "pt is more than welcome to go to the pharmacy and get some Afrin".

## 2015-06-17 NOTE — ED Notes (Signed)
Bed: WA20 Expected date:  Expected time:  Means of arrival:  Comments: EMS- 49yo F, HTN

## 2015-06-17 NOTE — Discharge Instructions (Signed)

## 2015-06-17 NOTE — Progress Notes (Signed)
[facility]  ED CM consulted by ED RN discharging her when pt states she is unable to afford medications Pt was on the phone with a female name "[name]" when CM entered her room Pt requested CM to "hold on"  Pt told person on the phone she was dying "I just found out devastating news.  You know I'm not use to this"  " I did not know it was spreading through out my body" "remember when I had carpal tunnel" Pt when she got off the phone with "angela" states she was sorry and Cm informed her she was "alright" Pt states "I'm not okay. I'm dying.  I have neuropathy"   CM informed pt she was only addressing that she was ok and she did not have to be sorry for having CM to wait for her to complete her call Cm began to discussed being consulted to offer her resources for pcp, medications and [place] uninsured resources Pt interrupted Cm to state her friend would help her and [facility]. CM spoke with pt who confirms [place]cp.  CM discussed and provided written information for uninsured accepting pcps, discussed the importance of pcp vs EDP services for f/u care, www.needymeds.org, www.goodrx.com, discounted pharmacies and other [place] resources such as [facility] , [facility], affordable care act, financial assistance, uninsured dental services, [place] med assist, DSS and  health department  Reviewed resources for [place] uninsured accepting pcps like [facility], family medicine at [address], [facility], [facility], local urgent care centers, [facility], [facility], general medical clinics, [facility] services of the [place], [facility] urgent care plus others, medication resources, [facility] out patient pharmacies and housing Pt voiced understanding and appreciation of resources provided   Provided [facility] contact information Pt did not agree to a referral Cm completed referral Pt later came out of her room  requesting a "knife " to use to eat her graham crackers with while at the bus stop" ED RN assisted her with items Pt left to go to the bus stop

## 2015-06-26 ENCOUNTER — Emergency Department (HOSPITAL_COMMUNITY)
Admission: EM | Admit: 2015-06-26 | Discharge: 2015-06-27 | Disposition: A | Discharge status: Home / Self Care | Payer: Self-pay | Attending: Emergency Medicine | Admitting: Emergency Medicine

## 2015-06-26 ENCOUNTER — Encounter (HOSPITAL_COMMUNITY): Payer: Self-pay | Admitting: *Deleted

## 2015-06-26 ENCOUNTER — Emergency Department (HOSPITAL_COMMUNITY): Payer: Self-pay

## 2015-06-26 DIAGNOSIS — Z8711 Personal history of peptic ulcer disease: Secondary | ICD-10-CM | POA: Insufficient documentation

## 2015-06-26 DIAGNOSIS — K219 Gastro-esophageal reflux disease without esophagitis: Secondary | ICD-10-CM | POA: Insufficient documentation

## 2015-06-26 DIAGNOSIS — F32A Depression, unspecified: Secondary | ICD-10-CM

## 2015-06-26 DIAGNOSIS — F1910 Other psychoactive substance abuse, uncomplicated: Secondary | ICD-10-CM

## 2015-06-26 DIAGNOSIS — Z880 Allergy status to penicillin: Secondary | ICD-10-CM | POA: Insufficient documentation

## 2015-06-26 DIAGNOSIS — Z791 Long term (current) use of non-steroidal anti-inflammatories (NSAID): Secondary | ICD-10-CM | POA: Insufficient documentation

## 2015-06-26 DIAGNOSIS — Z79899 Other long term (current) drug therapy: Secondary | ICD-10-CM | POA: Insufficient documentation

## 2015-06-26 DIAGNOSIS — F329 Major depressive disorder, single episode, unspecified: Secondary | ICD-10-CM | POA: Insufficient documentation

## 2015-06-26 DIAGNOSIS — F419 Anxiety disorder, unspecified: Secondary | ICD-10-CM | POA: Insufficient documentation

## 2015-06-26 DIAGNOSIS — I10 Essential (primary) hypertension: Secondary | ICD-10-CM | POA: Insufficient documentation

## 2015-06-26 DIAGNOSIS — J45909 Unspecified asthma, uncomplicated: Secondary | ICD-10-CM | POA: Insufficient documentation

## 2015-06-26 DIAGNOSIS — F309 Manic episode, unspecified: Secondary | ICD-10-CM

## 2015-06-26 DIAGNOSIS — F1410 Cocaine abuse, uncomplicated: Secondary | ICD-10-CM | POA: Insufficient documentation

## 2015-06-26 HISTORY — DX: Bipolar disorder, unspecified: F31.9

## 2015-06-26 LAB — PREGNANCY, URINE: Preg Test, Ur: NEGATIVE

## 2015-06-26 LAB — COMPREHENSIVE METABOLIC PANEL
ALT: 20 U/L (ref 14–54)
AST: 25 U/L (ref 15–41)
Albumin: 3.8 g/dL (ref 3.5–5.0)
Alkaline Phosphatase: 76 U/L (ref 38–126)
Anion gap: 13 (ref 5–15)
BUN: 6 mg/dL (ref 6–20)
CO2: 25 mmol/L (ref 22–32)
Calcium: 9.7 mg/dL (ref 8.9–10.3)
Chloride: 102 mmol/L (ref 101–111)
Creatinine, Ser: 0.8 mg/dL (ref 0.44–1.00)
GFR calc Af Amer: >60 mL/min (ref >60)
GFR calc non Af Amer: >60 mL/min (ref >60)
Glucose, Bld: 110 mg/dL — ABNORMAL HIGH (ref 65–99)
Potassium: 3.4 mmol/L — ABNORMAL LOW (ref 3.5–5.1)
Sodium: 140 mmol/L (ref 135–145)
Total Bilirubin: 0.5 mg/dL (ref 0.3–1.2)
Total Protein: 6.9 g/dL (ref 6.5–8.1)

## 2015-06-26 LAB — TSH: TSH: 0.896 u[IU]/mL (ref 0.350–4.500)

## 2015-06-26 LAB — I-STAT TROPONIN, ED
Troponin i, poc: 0 ng/mL (ref 0.00–0.08)
Troponin i, poc: 0 ng/mL (ref 0.00–0.08)

## 2015-06-26 LAB — CBC
HCT: 41.1 % (ref 36.0–46.0)
Hemoglobin: 14 g/dL (ref 12.0–15.0)
MCH: 33.3 pg (ref 26.0–34.0)
MCHC: 34.1 g/dL (ref 30.0–36.0)
MCV: 97.9 fL (ref 78.0–100.0)
Platelets: 294 10*3/uL (ref 150–400)
RBC: 4.2 MIL/uL (ref 3.87–5.11)
RDW: 12 % (ref 11.5–15.5)
WBC: 9.7 10*3/uL (ref 4.0–10.5)

## 2015-06-26 LAB — URINALYSIS W MICROSCOPIC (NOT AT ARMC)
Bilirubin Urine: NEGATIVE
Glucose, UA: NEGATIVE mg/dL
Hgb urine dipstick: NEGATIVE
Ketones, ur: NEGATIVE mg/dL
Leukocytes, UA: NEGATIVE
Nitrite: NEGATIVE
Protein, ur: 30 mg/dL — AB
Specific Gravity, Urine: 1.016 (ref 1.005–1.030)
Urobilinogen, UA: 1 mg/dL (ref 0.0–1.0)
pH: 6 (ref 5.0–8.0)

## 2015-06-26 LAB — SALICYLATE LEVEL: Salicylate Lvl: <4 mg/dL (ref 2.8–30.0)

## 2015-06-26 LAB — RAPID URINE DRUG SCREEN, HOSP PERFORMED
Amphetamines: NOT DETECTED
Barbiturates: NOT DETECTED
Benzodiazepines: NOT DETECTED
Cocaine: POSITIVE — AB
Opiates: NOT DETECTED
Tetrahydrocannabinol: NOT DETECTED

## 2015-06-26 LAB — ACETAMINOPHEN LEVEL: Acetaminophen (Tylenol), Serum: <10 ug/mL — ABNORMAL LOW (ref 10–30)

## 2015-06-26 LAB — ETHANOL: Alcohol, Ethyl (B): <5 mg/dL (ref <5)

## 2015-06-26 MED ORDER — GABAPENTIN 300 MG PO CAPS
900.0000 mg | ORAL_CAPSULE | Freq: Three times a day (TID) | ORAL | Status: DC
  Administered 2015-06-26 – 2015-06-27 (×2): 900 mg via ORAL
  Filled 2015-06-26 (×2): qty 3

## 2015-06-26 MED ORDER — CLONIDINE HCL 0.1 MG PO TABS
0.1000 mg | ORAL_TABLET | Freq: Every day | ORAL | Status: DC
  Administered 2015-06-26: 0.1 mg via ORAL
  Filled 2015-06-26: qty 1

## 2015-06-26 MED ORDER — LISINOPRIL 20 MG PO TABS
20.0000 mg | ORAL_TABLET | Freq: Every day | ORAL | Status: DC
  Administered 2015-06-27: 20 mg via ORAL
  Filled 2015-06-26: qty 1

## 2015-06-26 MED ORDER — IBUPROFEN 400 MG PO TABS: 600.0000 mg | ORAL_TABLET | Freq: Three times a day (TID) | ORAL | Status: DC | PRN

## 2015-06-26 MED ORDER — LORAZEPAM 1 MG PO TABS
1.0000 mg | ORAL_TABLET | Freq: Three times a day (TID) | ORAL | Status: DC | PRN
  Administered 2015-06-27: 1 mg via ORAL
  Filled 2015-06-26: qty 1

## 2015-06-26 MED ORDER — LURASIDONE HCL 60 MG PO TABS: 60.0000 mg | ORAL_TABLET | Freq: Every day | ORAL | Status: DC

## 2015-06-26 MED ORDER — OMEPRAZOLE MAGNESIUM 20 MG PO TBEC: 20.0000 mg | DELAYED_RELEASE_TABLET | Freq: Every day | ORAL | Status: DC

## 2015-06-26 MED ORDER — MIRTAZAPINE 45 MG PO TABS
45.0000 mg | ORAL_TABLET | Freq: Every day | ORAL | Status: DC
  Administered 2015-06-27: 45 mg via ORAL
  Filled 2015-06-26 (×3): qty 1

## 2015-06-26 MED ORDER — TRAZODONE HCL 100 MG PO TABS
300.0000 mg | ORAL_TABLET | Freq: Every day | ORAL | Status: DC
  Administered 2015-06-26: 300 mg via ORAL
  Filled 2015-06-26: qty 3

## 2015-06-26 MED ORDER — ACETAMINOPHEN 325 MG PO TABS
650.0000 mg | ORAL_TABLET | ORAL | Status: DC | PRN
Start: 2015-06-26 — End: 2015-06-27

## 2015-06-26 MED ORDER — LURASIDONE HCL 40 MG PO TABS
60.0000 mg | ORAL_TABLET | Freq: Every day | ORAL | Status: DC
  Administered 2015-06-27: 60 mg via ORAL
  Filled 2015-06-26 (×2): qty 1

## 2015-06-26 MED ORDER — PANTOPRAZOLE SODIUM 40 MG PO TBEC
40.0000 mg | DELAYED_RELEASE_TABLET | Freq: Every day | ORAL | Status: DC
  Administered 2015-06-27: 40 mg via ORAL
  Filled 2015-06-26: qty 1

## 2015-06-26 MED ORDER — ALBUTEROL SULFATE HFA 108 (90 BASE) MCG/ACT IN AERS
1.0000 | INHALATION_SPRAY | Freq: Four times a day (QID) | RESPIRATORY_TRACT | Status: DC | PRN
  Administered 2015-06-27: 2 via RESPIRATORY_TRACT
  Filled 2015-06-26: qty 6.7

## 2015-06-26 MED ORDER — BUSPIRONE HCL 10 MG PO TABS
10.0000 mg | ORAL_TABLET | Freq: Three times a day (TID) | ORAL | Status: DC
  Administered 2015-06-26 – 2015-06-27 (×2): 10 mg via ORAL
  Filled 2015-06-26 (×2): qty 1

## 2015-06-26 MED ORDER — BACLOFEN 10 MG PO TABS
10.0000 mg | ORAL_TABLET | Freq: Three times a day (TID) | ORAL | Status: DC
  Administered 2015-06-27 (×2): 10 mg via ORAL
  Filled 2015-06-26 (×5): qty 1

## 2015-06-26 NOTE — ED Notes (Signed)
Pt upset that she has a Comptrollersitter, explained to pt that Chales AbrahamsMary ann spoke with triage nurse previously and had stated that pt was having thoughts of harming herself. Pt on the phone with mary anne at this time and continuously stating "i am not here because i want to hurt myself, I  Never told anyone I wanted to hurt myself." West BaliMary Anne states "I said pt was manic because she has not been taking her medicines, I didn't say she was suicidal." Dr. Verdie MosherLiu notified and d/c sitter order.    Pt updated and informed.

## 2015-06-26 NOTE — ED Notes (Signed)
Pt in scrubs and wanded by security. Staffing aware of need for sitter.

## 2015-06-26 NOTE — ED Notes (Addendum)
Pt states that she has chest pain and irregular heartbeat starting yesterday. Pt states that she has been having bilateral hand and leg swelling. Pt has multiple complaints. Pt has neuropathy that is causing her to be unsteady on her feet. Pt reports wanting detox from ETOH and crack cocaine. Pt states that her last use was yesterday. [facility]
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
## 2015-06-26 NOTE — BH Assessment (Signed)
Tele Assessment Note   Amanda [name] is a 49 y.o. female who voluntarily presents to [facility] for alcohol and crack detox.  Pt denies SI/HI/AVH. Pt reports the following: pt was encouraged to come the emerg dept by [facility] for psych eval, she has not been taking her medications because they were stolen and she is homeless.  Pt did not want to go to the shelter that she was assigned and states that if she doesn't get help, she's going to die.  She says she had 9 yrs sobriety and relapsed recently.  She has been drinking "4-5 40's or more", daily and her last drink was this morning.  She consumed 1 case of beer.  Pt also smokes $400-$500 worth of crack everyday and states she has depleted her 401K savings.  Pt denies HI/AVH, she has no seizures or blackouts from her SA use.  Pt says her health problems are worsening and she c/o severe neuropathy in her legs and feet.  She denies legal charges.  She has w/d sxs: tremors, sweats, and states that her "skin is crawling".  Pt says she is severely depressed because of her situation and repeatedly states if she doesn't get help she is going to die. This Clinical research associate discussed the disposition with Donell Sievert, PA who recommends inpt tx for this pt.    Diagnosis: Axis I: 296.53 Bipolar I disorder, Current or most recent episode depressed, Severe; 304.20 Cocaine use disorder, Severe  Past Medical History:  Past Medical History  Diagnosis Date  . Depression   . Anxiety   . Carpal tunnel syndrome, bilateral   . GERD (gastroesophageal reflux disease)   . Neuropathy (HCC)   . Hypertension   . Asthma   . PUD (peptic ulcer disease)   . Bipolar disorder [facility])     Past Surgical History  Procedure Laterality Date  . Rotator cuff repair Right 2011  . Hammer toe fusion Bilateral 2008  . Carpal tunnel release Right 2003  . Cholecystectomy N/A 02/09/2014    Procedure: LAPAROSCOPIC CHOLECYSTECTOMY WITH INTRAOPERATIVE CHOLANGIOGRAM;  Surgeon: Valarie Merino, MD;  Location:  [facility];  Service: General;  Laterality: N/A;    Family History:  Family History  Problem Relation Age of Onset  . Cancer Mother     pancreatic  . Heart failure Father   . Cancer Paternal Grandmother   . HIV/AIDS Brother     Social History:  reports that she has never smoked. She does not have any smokeless tobacco history on file. She reports that she drinks alcohol. She reports that she uses illicit drugs ("Crack" cocaine).  Additional Social History:  Alcohol / Drug Use Pain Medications: See MAR  Prescriptions: See MAR  Over the Counter: See MAR  History of alcohol / drug use?: Yes Longest period of sobriety (when/how long): Only when in detox  Negative Consequences of Use: Work / Programmer, multimedia, Copywriter, advertising relationships, Surveyor, quantity Withdrawal Symptoms: Sweats, Tremors, Other (Comment) (Skin crawling ) Substance #1 Name of Substance 1: Alcohol  1 - Age of First Use: 18 YOF 1 - Amount (size/oz): "4-5 40's or more" 1 - Frequency: Daily  1 - Duration: On-going  1 - Last Use / Amount: 06/26/15 Substance #2 Name of Substance 2: Crack Cocaine  2 - Age of First Use: Teens  2 - Amount (size/oz): $400-$500 2 - Frequency: Daily  2 - Duration: On-going  2 - Last Use / Amount: 06/26/15  CIWA: CIWA-Ar BP: 139/81 mmHg Pulse Rate: 100 COWS:  PATIENT STRENGTHS: (choose at least two) Communication skills Motivation for treatment/growth  Allergies:  Allergies  Allergen Reactions  . Penicillins Anaphylaxis    Has patient had a PCN reaction causing immediate rash, facial/tongue/throat swelling, SOB or lightheadedness with hypotension: Yes Has patient had a PCN reaction causing severe rash involving mucus membranes or skin necrosis: Yes Has patient had a PCN reaction that required hospitalization Yes Has patient had a PCN reaction occurring within the last 10 years: No-more than 10 years ago If all of the above answers are "NO", then may proceed with Cephalosporin use.     Home  Medications:  (Not in a hospital admission)  OB/GYN Status:  Patient's last menstrual period was 05/31/2015 (exact date).  General Assessment Data [facility] Assessment: In system Is this a Tele or Face-to-Face Assessment?: Tele Assessment Is this an Initial Assessment or a Re-assessment for this encounter?: Initial Assessment Marital status: Single Maiden name: Rolena InfanteDunston  Is patient pregnant?: No Pregnancy Status: No Living Arrangements: Other (Comment) (Homeless ) Can pt return to current living arrangement?: Yes Admission Status: Voluntary Is patient capable of signing voluntary admission?: Yes Referral Source: MD Insurance type: SP  Medical Screening Exam [facility]) Medical Exam completed: No Reason for MSE not completed: Other: (None )  Crisis Care Plan Living Arrangements: Other (Comment) (Homeless ) Name of Psychiatrist: None  Name of Therapist: None   Education Status Is patient currently in school?: No Current Grade: None  Highest grade of school patient has completed: None  Name of school: None  Contact person: None   Risk to self with the past 6 months Suicidal Ideation: No Has patient been a risk to self within the past 6 months prior to admission? : No Suicidal Intent: No Has patient had any suicidal intent within the past 6 months prior to admission? : No Is patient at risk for suicide?: No Suicidal Plan?: No Has patient had any suicidal plan within the past 6 months prior to admission? : No Access to Means: No What has been your use of drugs/alcohol within the last 12 months?: Abusing: alcohol/crack  Previous Attempts/Gestures: No How many times?: 0 Other Self Harm Risks: None  Triggers for Past Attempts: None known Intentional Self Injurious Behavior: None Family Suicide History: No Recent stressful life event(s): Recent negative physical changes, Other (Comment) (Chronic SA) Persecutory voices/beliefs?:  No Depression: Yes Depression Symptoms: Loss of interest in usual pleasures, Feeling worthless/self pity, Feeling angry/irritable Substance abuse history and/or treatment for substance abuse?: Yes Suicide prevention information given to non-admitted patients: Not applicable  Risk to Others within the past 6 months Homicidal Ideation: No Does patient have any lifetime risk of violence toward others beyond the six months prior to admission? : No Thoughts of Harm to Others: No Current Homicidal Intent: No Current Homicidal Plan: No Access to Homicidal Means: No Identified Victim: None  History of harm to others?: No Assessment of Violence: None Noted Violent Behavior Description: None  Does patient have access to weapons?: No Criminal Charges Pending?: No Does patient have a court date: No Is patient on probation?: No  Psychosis Hallucinations: None noted Delusions: None noted  Mental Status Report Appearance/Hygiene: Disheveled, In scrubs Eye Contact: Good Motor Activity: Unremarkable Speech: Logical/coherent Level of Consciousness: Alert, Irritable Mood: Depressed, Irritable Affect: Depressed, Irritable Anxiety Level: Minimal Thought Processes: Coherent, Relevant Judgement: Unimpaired Orientation: Person, Place, Time, Situation Obsessive Compulsive Thoughts/Behaviors: None  Cognitive Functioning Concentration: Normal Memory: Recent Intact, Remote Intact  IQ: Average Insight: Fair Impulse Control: Good Appetite: Good Weight Loss: 0 Weight Gain: 0 Sleep: Decreased Total Hours of Sleep: 4 Vegetative Symptoms: None  ADLScreening [facility] Assessment Services) Patient's cognitive ability adequate to safely complete daily activities?: Yes Patient able to express need for assistance with ADLs?: Yes Independently performs ADLs?: Yes (appropriate for developmental age)  Prior Inpatient Therapy Prior Inpatient Therapy: Yes Prior Therapy Dates: Unk  Prior Therapy  Facilty/Provider(s): Unk  Reason for Treatment: Detox Rehab   Prior Outpatient Therapy Prior Outpatient Therapy: No Prior Therapy Dates: None  Prior Therapy Facilty/Provider(s): None  Reason for Treatment: None  Does patient have an ACCT team?: No Does patient have Intensive In-House Services?  : No Does patient have [facility] services? : No Does patient have P4CC services?: No  ADL Screening (condition at time of admission) Patient's cognitive ability adequate to safely complete daily activities?: Yes Is the patient deaf or have difficulty hearing?: No Does the patient have difficulty seeing, even when wearing glasses/contacts?: No Does the patient have difficulty concentrating, remembering, or making decisions?: No Patient able to express need for assistance with ADLs?: Yes Does the patient have difficulty dressing or bathing?: No Independently performs ADLs?: Yes (appropriate for developmental age) Does the patient have difficulty walking or climbing stairs?: Yes (Pt c/o severe neuropathy) Weakness of Legs: Both (Pt c/o severe pain due to neuropathy ) Weakness of Arms/Hands: None  Home Assistive Devices/Equipment Home Assistive Devices/Equipment: None  Therapy Consults (therapy consults require a physician order) PT Evaluation Needed: No OT Evalulation Needed: No SLP Evaluation Needed: No Abuse/Neglect Assessment (Assessment to be complete while patient is alone) Physical Abuse: Denies Verbal Abuse: Denies Sexual Abuse: Denies Exploitation of patient/patient's resources: Denies Self-Neglect: Denies Values / Beliefs Cultural Requests During Hospitalization: None Spiritual Requests During Hospitalization: None Consults Spiritual Care Consult Needed: No Social Work Consult Needed: No Merchant navy officer (For Healthcare) Does patient have an advance directive?: No Would patient like information on creating an advanced directive?: No - patient declined information     Additional Information 1:1 In Past 12 Months?: No CIRT Risk: No Elopement Risk: No Does patient have medical clearance?: Yes     Disposition:  Disposition Initial Assessment Completed for this Encounter: Yes Disposition of Patient: Referred to (Per Donell Sievert, PA recommends intp admissions) Patient referred to: Other (Comment) (Per Donell Sievert, PA recommends inpt admission )  Murrell Redden 06/26/2015 8:35 PM

## 2015-06-26 NOTE — ED Provider Notes (Signed)
CSN: [id number]     Arrival date & time 06/26/15  1553 History   First MD Initiated Contact with Patient 06/26/15 1717     Chief Complaint  Patient presents with  . Chest Pain     (Consider location/radiation/quality/duration/timing/severity/associated sxs/prior Treatment) HPI 49 year old female with history of hypertension, depression, anxiety, and polysubstance abuse who presents with chest tightness in the setting of cocaine abuse and alcohol abuse. State that she had relapsed yesterday evening, and has been up until 7:53 AM this morning smoking $400 worth of cocaine as well as a significant amount of liquor intake. States that she was also recently diagnosed with neuropathy, and has had significant low back pain as well as burning pain in her bilateral lower extremities. Difficult to obtain history from this patient given significant flight of ideas as well as poor redirection. Denies suicide or homicide ideations.   Past Medical History  Diagnosis Date  . Depression   . Anxiety   . Carpal tunnel syndrome, bilateral   . GERD (gastroesophageal reflux disease)   . Neuropathy (HCC)   . Hypertension   . Asthma   . PUD (peptic ulcer disease)   . Bipolar disorder [facility](HCC)    Past Surgical History  Procedure Laterality Date  . Rotator cuff repair Right 2011  . Hammer toe fusion Bilateral 2008  . Carpal tunnel release Right 2003  . Cholecystectomy N/A 02/09/2014    Procedure: LAPAROSCOPIC CHOLECYSTECTOMY WITH INTRAOPERATIVE CHOLANGIOGRAM;  Surgeon: Valarie MerinoMatthew B Martin, MD;  Location: [facility];  Service: General;  Laterality: N/A;   Family History  Problem Relation Age of Onset  . Cancer Mother     pancreatic  . Heart failure Father   . Cancer Paternal Grandmother   . HIV/AIDS Brother    Social History  Substance Use Topics  . Smoking status: Never Smoker   . Smokeless tobacco: None  . Alcohol Use: Yes     Comment: 6 pack to a case of beer a day   OB History    No data available      Review of Systems 10/14 systems reviewed and are negative other than those stated in the HPI   Allergies  Penicillins  Home Medications   Prior to Admission medications   Medication Sig Start Date End Date Taking? Authorizing Provider  albuterol (PROVENTIL HFA;VENTOLIN HFA) 108 (90 BASE) MCG/ACT inhaler Inhale 1-2 puffs into the lungs every 6 (six) hours as needed for wheezing or shortness of breath.   Yes Historical Provider, MD  atenolol (TENORMIN) 50 MG tablet Take 50 mg by mouth 2 (two) times daily.   Yes Historical Provider, MD  baclofen (LIORESAL) 10 MG tablet Take 10 mg by mouth 3 (three) times daily.   Yes Historical Provider, MD  busPIRone (BUSPAR) 10 MG tablet Take 10 mg by mouth 3 (three) times daily.   Yes Historical Provider, MD  cetirizine (ZYRTEC) 10 MG tablet Take 10 mg by mouth daily.   Yes Historical Provider, MD  cloNIDine (CATAPRES) 0.1 MG tablet Take 0.1 mg by mouth at bedtime.   Yes Historical Provider, MD  gabapentin (NEURONTIN) 300 MG capsule Take 900 mg by mouth 3 (three) times daily.    Yes Historical Provider, MD  hydrOXYzine (ATARAX/VISTARIL) 50 MG tablet Take 50 mg by mouth 2 (two) times daily.    Yes Historical Provider, MD  lisinopril (PRINIVIL,ZESTRIL) 20 MG tablet Take 20 mg by mouth daily.   Yes Historical Provider, MD  Lurasidone HCl (LATUDA) 60 MG TABS  Take 60 mg by mouth daily.   Yes Historical Provider, MD  meloxicam (MOBIC) 15 MG tablet Take 15 mg by mouth daily.    Yes Historical Provider, MD  mirtazapine (REMERON) 30 MG tablet Take 45 mg by mouth at bedtime.   Yes Historical Provider, MD  omeprazole (PRILOSEC OTC) 20 MG tablet Take 20 mg by mouth daily.   Yes Historical Provider, MD  traZODone (DESYREL) 300 MG tablet Take 1 tablet (300 mg total) by mouth at bedtime. 11/21/14  Yes Adonis Brook, NP  mirtazapine (REMERON) 15 MG tablet Take 1 tablet (15 mg total) by mouth at bedtime. Patient not taking: Reported on 01/04/2015 11/21/14   Adonis Brook, NP  naltrexone (DEPADE) 50 MG tablet Take 0.5 tablets (25 mg total) by mouth daily. Patient not taking: Reported on 01/04/2015 11/21/14   Adonis Brook, NP  nicotine (NICODERM CQ - DOSED IN MG/24 HOURS) 21 mg/24hr patch Place 1 patch (21 mg total) onto the skin daily. Patient not taking: Reported on 01/04/2015 11/21/14   Adonis Brook, NP  pantoprazole (PROTONIX) 40 MG tablet Take 2 tablets (80 mg total) by mouth daily. Patient not taking: Reported on 01/04/2015 11/21/14   Adonis Brook, NP  sucralfate (CARAFATE) 1 G tablet Take 1 tablet (1 g total) by mouth 3 (three) times daily. Patient not taking: Reported on 01/04/2015 11/21/14   Adonis Brook, NP   BP 139/81 mmHg  Pulse 100  Resp 24  SpO2 100%  LMP 05/31/2015 (Exact Date) Physical Exam  Psychiatric: Her mood appears anxious. Her speech is rapid and/or pressured. She is agitated and hyperactive. She is not actively hallucinating. Thought content is not paranoid. Cognition and memory are normal. She expresses impulsivity. She expresses no homicidal and no suicidal ideation. She expresses no suicidal plans and no homicidal plans.    Physical Exam  Nursing note and vitals reviewed. Constitutional: Well developed, well nourished, non-toxic, and in no acute distress Head: Normocephalic and atraumatic.  Mouth/Throat: Oropharynx is clear and moist.  Neck: Normal range of motion. Neck supple.  Cardiovascular: Normal rate and regular rhythm.   Pulmonary/Chest: Effort normal and breath sounds normal.  Abdominal: Soft. There is no tenderness. There is no rebound and no guarding.  Musculoskeletal: Normal range of motion.  Neurological: Alert, no facial droop, fluent speech, moves all extremities symmetrically Skin: Skin is warm and dry.     ED Course  Procedures (including critical care time) Labs Review Labs Reviewed  COMPREHENSIVE METABOLIC PANEL - Abnormal; Notable for the following:    Potassium 3.4 (*)    Glucose, Bld 110 (*)     All other components within normal limits  ACETAMINOPHEN LEVEL - Abnormal; Notable for the following:    Acetaminophen (Tylenol), Serum <10 (*)    All other components within normal limits  ETHANOL  SALICYLATE LEVEL  CBC  PREGNANCY, URINE  TSH  URINE RAPID DRUG SCREEN, HOSP PERFORMED  URINALYSIS W MICROSCOPIC  I-STAT TROPOININ, ED  Rosezena Sensor, ED    Imaging Review Dg Chest 2 View  06/26/2015  CLINICAL DATA:  Chest tightness and shortness of breath for 2 days. Asthma. EXAM: CHEST  2 VIEW COMPARISON:  10/18/2014 FINDINGS: The heart size and mediastinal contours are within normal limits. Both lungs are clear. The visualized skeletal structures are unremarkable. IMPRESSION: No active cardiopulmonary disease. Electronically Signed   By: Myles Rosenthal M.D.   On: 06/26/2015 19:00   I have personally reviewed and evaluated these images and lab results as part  of my medical decision-making.   EKG Interpretation   Date/Time:  Wednesday June 26 2015 16:11:33 EDT Ventricular Rate:  108 PR Interval:  132 QRS Duration: 70 QT Interval:  368 QTC Calculation: 493 R Axis:   62 Text Interpretation:  Sinus tachycardia Otherwise normal ECG Other than  tachycardia, no significant change from prior EKG Confirmed by Lakisha Peyser MD,  Euell Schiff [phone]) on 06/26/2015 5:38:58 PM      MDM   Final diagnoses:  Depression  Polysubstance abuse  Anxiety  Mania (monopolar) single episode or unspecified    49 year old female with history of anxiety, depression, polysubstance abuse who presents with chest tightness and worsening lower extremity neuropathy in the setting of cocaine and alcohol abuse. Is nontoxic in no acute distress on presentation, but has flight of ideas, rapid and pressured speech, and poor directability. Concern for manic episode, Especially given history that she has not been taking her medications. Her vital signs are non-concerning, and exam is nonfocal and unremarkable. Symptoms may  also be in the setting of recent substance abuse. An EKG shows no ischemic changes but evidence of mild sinus tachycardia. Serial troponins are negative. CXR without acute cardiopulmonary processes. Do not think that this is due to underlying ACS or other serious/toxic cardiopulmonary process. No major metabolic or electrolyte derangements. TTS his consulted, and recommended inpatient admission. Psych hold orders will be placed on this patient.  Lavera Guise, MD 06/26/15 2152

## 2015-06-26 NOTE — ED Notes (Addendum)
Pt from home for eval of neuropathic pain to bilateral feet and lower back pain. Pt reports she normally takes neurotin 800 mg but reports meds are not helping. Pt also reports that she relapsed from ETOH and cocaine and wants detox so that she can be safe.   Pt states smoked $400-$500 cocaine last night, and also states "drank beer and liquor last night."

## 2015-06-27 NOTE — ED Notes (Signed)
MD at bedside speaking with patient.

## 2015-06-27 NOTE — ED Notes (Signed)
Reported off to Marisel, RN.

## 2015-06-27 NOTE — Progress Notes (Signed)
Discussed pt's case with Dr. Lucianne MussKumar and after review of chart and presenting symptoms, Dr. Lucianne MussKumar advises pt does not meet inpatient psych criteria. As pt's BAL <5 and CIWA =3 8 hours ago, pt also does not meet criteria for detox referrals. Advises pt be d/c'd with outpt SA resources. Pt already involved with [facility].  Spoke with [facility] SW re: pt's disposition.  Ilean SkillMeghan Amaiah Cristiano, MSW, LCSW Clinical Social Work, Disposition  06/27/2015 [phone]

## 2015-06-27 NOTE — ED Notes (Signed)
All pt belongings and medication returned to pt, pt signed for return.  Copies in medical records.  Pt showered, a x 4, ambulatory, bus pas given, aware of resources available to her.  Water given to pt and pt escorted to waiting area, directed to bus stop.

## 2015-06-27 NOTE — ED Notes (Signed)
Pt requesting to see MD before being discharged, [facility]

## 2015-06-27 NOTE — Progress Notes (Addendum)
LCSW met with patient per her request. Amanda [name] is very upset she is not being admitted to [facility] for treatment and help.  She reports " you all are just throwing me to the curb". LCSW de-escalated patient in efforts to explain reasoning behind recommendation, but also to explain alternative options. Patient speaking rapidly and tangential regarding her neuropathy.  She is able to define her need for "doing something every day" and offered [facility] so she can establish services. Patient is already active with CM at [facility] and clinic. She does not qualify for detox services at this time. She is offered [employer] and screening date, but she declines.  Patient given information about [facility] and walk in clinic to establish services. She is aware of AA meetings and wants to starting going back to meetings. LCSW also called Da[facility] Perch with regards to new referral for [facility] team.    Patient to be discharged after she finishes her lunch. She is asking to make a phone call to which she is allowed. Most likely she will need a bus pass and LCSW has supplied this mode of transportation to RN for transport.  Lane Hacker, MSW Clinical Social Work: Emergency Room [phone]

## 2015-06-27 NOTE — ED Notes (Signed)
SW Dahlia ClientHannah at bedside speaking with pt.

## 2015-06-27 NOTE — ED Notes (Signed)
Pt at RN desk stating "we are going to have to do something or she will die, addiction is serious", "she will show herself in order to get some help", pt asked to go back to room and CSW will come speak with her.  Pt back to room.

## 2015-06-27 NOTE — ED Notes (Signed)
Pt requesting to shower before leaving, pt given clean scrubs and shower supplies, to shower before discharge.

## 2015-06-27 NOTE — Discharge Instructions (Signed)
Major Depressive Disorder Major depressive disorder is a mental illness. It also may be called clinical depression or unipolar depression. Major depressive disorder usually causes feelings of sadness, hopelessness, or helplessness. Some people with this disorder do not feel particularly sad but lose interest in doing things they used to enjoy (anhedonia). Major depressive disorder also can cause physical symptoms. It can interfere with work, school, relationships, and other normal everyday activities. The disorder varies in severity but is longer lasting and more serious than the sadness we all feel from time to time in our lives. Major depressive disorder often is triggered by stressful life events or major life changes. Examples of these triggers include divorce, loss of your job or home, a move, and the death of a family member or close friend. Sometimes this disorder occurs for no obvious reason at all. People who have family members with major depressive disorder or bipolar disorder are at higher risk for developing this disorder, with or without life stressors. Major depressive disorder can occur at any age. It may occur just once in your life (single episode major depressive disorder). It may occur multiple times (recurrent major depressive disorder). SYMPTOMS People with major depressive disorder have either anhedonia or depressed mood on nearly a daily basis for at least 2 weeks or longer. Symptoms of depressed mood include:  Feelings of sadness (blue or down in the dumps) or emptiness.  Feelings of hopelessness or helplessness.  Tearfulness or episodes of crying (may be observed by others).  Irritability (children and adolescents). In addition to depressed mood or anhedonia or both, people with this disorder have at least four of the following symptoms:  Difficulty sleeping or sleeping too much.   Significant change (increase or decrease) in appetite or weight.   Lack of energy or  motivation.  Feelings of guilt and worthlessness.   Difficulty concentrating, remembering, or making decisions.  Unusually slow movement (psychomotor retardation) or restlessness (as observed by others).   Recurrent wishes for death, recurrent thoughts of self-harm (suicide), or a suicide attempt. People with major depressive disorder commonly have persistent negative thoughts about themselves, other people, and the world. People with severe major depressive disorder may experiencedistorted beliefs or perceptions about the world (psychotic delusions). They also may see or hear things that are not real (psychotic hallucinations). DIAGNOSIS Major depressive disorder is diagnosed through an assessment by your health care provider. Your health care provider will ask aboutaspects of your daily life, such as mood,sleep, and appetite, to see if you have the diagnostic symptoms of major depressive disorder. Your health care provider may ask about your medical history and use of alcohol or drugs, including prescription medicines. Your health care provider also may do a physical exam and blood work. This is because certain medical conditions and the use of certain substances can cause major depressive disorder-like symptoms (secondary depression). Your health care provider also may refer you to a mental health specialist for further evaluation and treatment. TREATMENT It is important to recognize the symptoms of major depressive disorder and seek treatment. The following treatments can be prescribed for this disorder:   Medicine. Antidepressant medicines usually are prescribed. Antidepressant medicines are thought to correct chemical imbalances in the brain that are commonly associated with major depressive disorder. Other types of medicine may be added if the symptoms do not respond to antidepressant medicines alone or if psychotic delusions or hallucinations occur.  Talk therapy. Talk therapy can be  helpful in treating major depressive disorder by providing   support, education, and guidance. Certain types of talk therapy also can help with negative thinking (cognitive behavioral therapy) and with relationship issues that trigger this disorder (interpersonal therapy). A mental health specialist can help determine which treatment is best for you. Most people with major depressive disorder do well with a combination of medicine and talk therapy. Treatments involving electrical stimulation of the brain can be used in situations with extremely severe symptoms or when medicine and talk therapy do not work over time. These treatments include electroconvulsive therapy, transcranial magnetic stimulation, and vagal nerve stimulation.   This information is not intended to replace advice given to you by your health care provider. Make sure you discuss any questions you have with your health care provider.   Document Released: 12/05/2012 Document Revised: 08/31/2014 Document Reviewed: 12/05/2012 Elsevier Interactive Patient Education 2016 Elsevier Inc. Generalized Anxiety Disorder Generalized anxiety disorder (GAD) is a mental disorder. It interferes with life functions, including relationships, work, and school. GAD is different from normal anxiety, which everyone experiences at some point in their lives in response to specific life events and activities. Normal anxiety actually helps us prepare for and get through these life events and activities. Normal anxiety goes away after the event or activity is over.  GAD causes anxiety that is not necessarily related to specific events or activities. It also causes excess anxiety in proportion to specific events or activities. The anxiety associated with GAD is also difficult to control. GAD can vary from mild to severe. People with severe GAD can have intense waves of anxiety with physical symptoms (panic attacks).  SYMPTOMS The anxiety and worry associated with GAD  are difficult to control. This anxiety and worry are related to many life events and activities and also occur more days than not for 6 months or longer. People with GAD also have three or more of the following symptoms (one or more in children):  Restlessness.   Fatigue.  Difficulty concentrating.   Irritability.  Muscle tension.  Difficulty sleeping or unsatisfying sleep. DIAGNOSIS GAD is diagnosed through an assessment by your health care provider. Your health care provider will ask you questions aboutyour mood,physical symptoms, and events in your life. Your health care provider may ask you about your medical history and use of alcohol or drugs, including prescription medicines. Your health care provider may also do a physical exam and blood tests. Certain medical conditions and the use of certain substances can cause symptoms similar to those associated with GAD. Your health care provider may refer you to a mental health specialist for further evaluation. TREATMENT The following therapies are usually used to treat GAD:   Medication. Antidepressant medication usually is prescribed for long-term daily control. Antianxiety medicines may be added in severe cases, especially when panic attacks occur.   Talk therapy (psychotherapy). Certain types of talk therapy can be helpful in treating GAD by providing support, education, and guidance. A form of talk therapy called cognitive behavioral therapy can teach you healthy ways to think about and react to daily life events and activities.  Stress managementtechniques. These include yoga, meditation, and exercise and can be very helpful when they are practiced regularly. A mental health specialist can help determine which treatment is best for you. Some people see improvement with one therapy. However, other people require a combination of therapies.   This information is not intended to replace advice given to you by your health care  provider. Make sure you discuss any questions you have with your   health care provider.   Document Released: 12/05/2012 Document Revised: 08/31/2014 Document Reviewed: 12/05/2012 Elsevier Interactive Patient Education 2016 Elsevier Inc.  

## 2015-06-27 NOTE — ED Notes (Addendum)
Pt using 1st phone call for the day.  Unable to reach family at this time.

## 2015-06-27 NOTE — ED Notes (Signed)
Pt sitting up in bed eating breakfast at this time

## 2015-06-27 NOTE — ED Notes (Signed)
Patient was given snack, sprite and a cup of coffee. Regular diet order taken.

## 2015-06-27 NOTE — ED Notes (Addendum)
Pt speaking with family on phone at this time-1st call of the day

## 2015-07-08 ENCOUNTER — Ambulatory Visit: Payer: Self-pay | Attending: Family Medicine | Admitting: Family Medicine

## 2015-07-08 ENCOUNTER — Encounter (HOSPITAL_BASED_OUTPATIENT_CLINIC_OR_DEPARTMENT_OTHER): Payer: Self-pay | Admitting: Clinical

## 2015-07-08 ENCOUNTER — Encounter: Payer: Self-pay | Admitting: Family Medicine

## 2015-07-08 VITALS — BP 151/94 | HR 80 | Temp 98.4°F | Resp 18 | Ht 68.0 in | Wt 200.0 lb

## 2015-07-08 DIAGNOSIS — G5600 Carpal tunnel syndrome, unspecified upper limb: Secondary | ICD-10-CM

## 2015-07-08 DIAGNOSIS — I10 Essential (primary) hypertension: Secondary | ICD-10-CM

## 2015-07-08 DIAGNOSIS — F32A Depression, unspecified: Secondary | ICD-10-CM

## 2015-07-08 DIAGNOSIS — F418 Other specified anxiety disorders: Secondary | ICD-10-CM

## 2015-07-08 DIAGNOSIS — F332 Major depressive disorder, recurrent severe without psychotic features: Secondary | ICD-10-CM

## 2015-07-08 DIAGNOSIS — G44229 Chronic tension-type headache, not intractable: Secondary | ICD-10-CM

## 2015-07-08 DIAGNOSIS — R600 Localized edema: Secondary | ICD-10-CM

## 2015-07-08 DIAGNOSIS — F329 Major depressive disorder, single episode, unspecified: Secondary | ICD-10-CM

## 2015-07-08 DIAGNOSIS — F419 Anxiety disorder, unspecified: Principal | ICD-10-CM

## 2015-07-08 DIAGNOSIS — G629 Polyneuropathy, unspecified: Secondary | ICD-10-CM

## 2015-07-08 MED ORDER — CYCLOBENZAPRINE HCL 10 MG PO TABS: 10.0000 mg | ORAL_TABLET | Freq: Three times a day (TID) | ORAL | Status: DC | PRN

## 2015-07-08 MED ORDER — GABAPENTIN 300 MG PO CAPS: 300.0000 mg | ORAL_CAPSULE | Freq: Three times a day (TID) | ORAL | Status: DC

## 2015-07-08 MED ORDER — TOPIRAMATE 50 MG PO TABS: 50.0000 mg | ORAL_TABLET | Freq: Two times a day (BID) | ORAL | Status: DC

## 2015-07-08 NOTE — Progress Notes (Signed)
ASSESSMENT: Pt currently experiencing symptoms of anxiety and depression, likely as symptoms of bipolar disorder. Pt needs to f/u with PCP and continue BH med management with psychiatrist; may benefit from psychoeducation and supportive counseling regarding coping with symptoms of anxiety and depression.  Stage of Change: precontemplative/contemplative  PLAN: 1. F/U with behavioral health consultant in one week via phone, 2 weeks at next physician visit 2. Psychiatric Medications: Xanax, Buspar, Latuda, Remeron. 3. Behavioral recommendation(s):   [facility] -Continue attending [facility] -Consider [facility] counseling -Consider reading educational material regarding coping with symptoms of anxiety and depression SUBJECTIVE: Pt. referred by Dr Venetia NightAmao for bipolar disorder:  Pt. reports the following symptoms/concerns: Pt states that she goes to, but does not like going [facility] because she talks to someone different each time, but is aware that is where she has to get her medication, has an upcoming appointment. 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 She states that being a lesbian is "one strike against me", admits to drug and alcohol problems, says she began attending [facility] group therapy, on her own, and it is working for her. Her primary concern is wanting to talk to the same therapist each time.  Duration of problem: Patient says " a long time", undetermined number of years Severity: severe  OBJECTIVE: Orientation & Cognition: Oriented x3. Thought processes normal and appropriate to situation. Mood: talkative Affect: expansive Appearance: appropriate Risk of harm to self or others: no known risk of harm to self or others today Substance use: cocaine, alcohol Assessments administered: PHQ9: 20/GAD7: 15  Diagnosis: anxiety and depression CPT  Code: F41.8 -------------------------------------------- Other(s) present in the room: none  Time spent with patient in exam room: 20 minutes

## 2015-07-08 NOTE — Progress Notes (Signed)
CC: here to establish care  HPI: Amanda [name] is a 49 y.o. female with a history of Hypertension, major depressive disorder, neuropathy who comes in here to establish care.  She was [facility] which she received medications for her hypertension and her psychiatric issues were managed at family services and [facility]. The patient is not a good historian and she does have flight and appears to be manic at this time. She was recently seen at [facility] where she had presented for alcohol and crack detox but he was determined that she did not meet criteria for inpatient detox. She informs me she has registered for ADS classes and is attending them. She has chronic low back pain and sees orthopedics-Dr.Ramos where she receives Oxycontin.  Complains of severe neuropathy in her feet and cannot feel the ground when she stands on it and also has neuropathy in her hands from carpal tunnel syndrome but goes on to tell me she is status post release surgery. Request a note to say that she cannot walk up stairs and will need to use elevators and also needs transportation. She complains of headaches which have frequent" 3-4 times a week on the right side of her head but denies nausea, blurry vision.  Allergies  Allergen Reactions  . Penicillins Anaphylaxis    Has patient had a PCN reaction causing immediate rash, facial/tongue/throat swelling, SOB or lightheadedness with hypotension: Yes Has patient had a PCN reaction causing severe rash involving mucus membranes or skin necrosis: Yes Has patient had a PCN reaction that required hospitalization Yes Has patient had a PCN reaction occurring within the last 10 years: No-more than 10 years ago If all of the above answers are "NO", then may proceed with Cephalosporin use.    Past Medical History  Diagnosis Date  . Depression   . Anxiety   . Carpal tunnel syndrome, bilateral   . GERD (gastroesophageal reflux disease)   . Neuropathy  (HCC)   . Hypertension   . Asthma   . PUD (peptic ulcer disease)   . Bipolar disorder [facility](HCC)    Current Outpatient Prescriptions on File Prior to Visit  Medication Sig Dispense Refill  . albuterol (PROVENTIL HFA;VENTOLIN HFA) 108 (90 BASE) MCG/ACT inhaler Inhale 1-2 puffs into the lungs every 6 (six) hours as needed for wheezing or shortness of breath.    Marland Kitchen. atenolol (TENORMIN) 50 MG tablet Take 50 mg by mouth 2 (two) times daily.    . baclofen (LIORESAL) 10 MG tablet Take 10 mg by mouth 3 (three) times daily.    . busPIRone (BUSPAR) 10 MG tablet Take 10 mg by mouth 3 (three) times daily.    . cetirizine (ZYRTEC) 10 MG tablet Take 10 mg by mouth daily.    . cloNIDine (CATAPRES) 0.1 MG tablet Take 0.1 mg by mouth at bedtime.    . hydrOXYzine (ATARAX/VISTARIL) 50 MG tablet Take 50 mg by mouth 2 (two) times daily.     Marland Kitchen. lisinopril (PRINIVIL,ZESTRIL) 20 MG tablet Take 20 mg by mouth daily.    . Lurasidone HCl (LATUDA) 60 MG TABS Take 60 mg by mouth daily.    . meloxicam (MOBIC) 15 MG tablet Take 15 mg by mouth daily.     . mirtazapine (REMERON) 30 MG tablet Take 45 mg by mouth at bedtime.    . sucralfate (CARAFATE) 1 G tablet Take 1 tablet (1 g total) by mouth 3 (three) times daily. 90 tablet 0  . traZODone (  DESYREL) 300 MG tablet Take 1 tablet (300 mg total) by mouth at bedtime. 30 tablet 0  . mirtazapine (REMERON) 15 MG tablet Take 1 tablet (15 mg total) by mouth at bedtime. (Patient not taking: Reported on 07/08/2015) 30 tablet 0  . naltrexone (DEPADE) 50 MG tablet Take 0.5 tablets (25 mg total) by mouth daily. (Patient not taking: Reported on 01/04/2015) 30 tablet 0  . nicotine (NICODERM CQ - DOSED IN MG/24 HOURS) 21 mg/24hr patch Place 1 patch (21 mg total) onto the skin daily. (Patient not taking: Reported on 01/04/2015) 28 patch 0  . omeprazole (PRILOSEC OTC) 20 MG tablet Take 20 mg by mouth daily.    . pantoprazole (PROTONIX) 40 MG tablet Take 2 tablets (80 mg total) by mouth daily. (Patient  not taking: Reported on 01/04/2015) 60 tablet 0   No current facility-administered medications on file prior to visit.   Family History  Problem Relation Age of Onset  . Cancer Mother     pancreatic  . Heart failure Father   . Cancer Paternal Grandmother   . HIV/AIDS Brother    Social History   Social History  . Marital Status: Single    Spouse Name: N/A  . Number of Children: N/A  . Years of Education: N/A   Occupational History  . [name]   Social History Main Topics  . Smoking status: Never Smoker   . Smokeless tobacco: Not on file  . Alcohol Use: Yes     Comment: 6 pack to a case of beer a day  . Drug Use: Yes    Special: "Crack" cocaine  . Sexual Activity: No   Other Topics Concern  . Not on file   Social History Narrative   Pt lives at home alone. She has a HS education level and does not have any children.    She drinks 2 cups of caffeine daily.    Review of Systems: Constitutional: Negative for fever, chills, diaphoresis, activity change, appetite change and fatigue. HENT: Negative for ear pain, nosebleeds, congestion, facial swelling, rhinorrhea, neck pain, neck stiffness and ear discharge.  Eyes: Negative for pain, discharge, redness, itching and visual disturbance. Respiratory: Negative for cough, choking, chest tightness, shortness of breath, wheezing and stridor.  Cardiovascular: Negative for chest pain, palpitations and leg swelling. Gastrointestinal: Negative for abdominal distention. Genitourinary: Negative for dysuria, urgency, frequency, hematuria, flank pain, decreased urine volume, difficulty urinating and dyspareunia.  Musculoskeletal: positive for wrist pain Neurological: Negative for dizziness, tremors, seizures, syncope, facial asymmetry, speech difficulty, weakness, light-headedness, positive for numbness and headaches.  Hematological: Negative for adenopathy. Does not bruise/bleed easily. Psychiatric/Behavioral: Negative for  hallucinations, behavioral problems, confusion, dysphoric mood, decreased concentration and agitation.    Objective:   Filed Vitals:   07/08/15 1429  BP: 151/94  Pulse: 80  Temp: 98.4 F (36.9 C)  Resp: 18    Physical Exam: Constitutional: Patient appears well-developed and well-nourished. No distress. HENT: Normocephalic, atraumatic, External right and left ear normal. Oropharynx is clear and moist.  Eyes: Conjunctivae and EOM are normal. PERRLA, no scleral icterus. Neck: Normal ROM. Neck supple. No JVD. No tracheal deviation. No thyromegaly. CVS: RRR, S1/S2 +, no murmurs, no gallops, no carotid bruit.  Pulmonary: Effort and breath sounds normal, no stridor, rhonchi, wheezes, rales.  Abdominal: Soft. BS +,  no distension, tenderness, rebound or guarding.  Musculoskeletal: Normal range of motion. No edema and no tenderness.  Lymphadenopathy: No lymphadenopathy noted, cervical, inguinal or axillary  Neuro: Alert. Normal reflexes, muscle tone coordination. No cranial nerve deficit. Skin: Skin is warm and dry. No rash noted. Not diaphoretic. No erythema. No pallor. Psychiatric: Normal mood and affect. Behavior, judgment, thought content normal.  Lab Results  Component Value Date   WBC 9.7 06/26/2015   HGB 14.0 06/26/2015   HCT 41.1 06/26/2015   MCV 97.9 06/26/2015   PLT 294 06/26/2015   Lab Results  Component Value Date   CREATININE 0.80 06/26/2015   BUN 6 06/26/2015   NA 140 06/26/2015   K 3.4* 06/26/2015   CL 102 06/26/2015   CO2 25 06/26/2015    Lab Results  Component Value Date   HGBA1C 5.1 11/18/2012      Assessment and plan:   Hypertension:  blood pressure is elevated today and the patient  insists that she took her atenolol, lisinopril, clonidine  I have advised her to bring in all her medications at her next office visit for review. Meanwhile I will make no changes at this time until her next office visit.  Depression and anxiety:  She has an upcoming  appointment with [facility] this week and has been advised to keep it. She does  Seem to be manic at this time I would need to close follow-up with psychiatry.  LCSW called in to see the patient  Neuropathy: Refilled Neurontin I have given her a note stating she has Neuropathy but explained to her I would not be specific as to stating she had to use elevators or needed transportation.  Pedal edema: She does not have cardiac symptoms at this time however I will send off a BNP. This could be dependent edema and she has been advised to eat feet, use compression stockings and cut back on sodium intake.  Headaches: Placed on Topamax Will reassess at next office visit for improvement  Jaclyn Shaggy, MD. [facility] [phone] 07/08/2015, 3:15 PM

## 2015-07-08 NOTE — Progress Notes (Signed)
Pt's here for f/up for neuropathy and HTN. Pt reports pain in legs, hands and feet 8/10 and described as throbbing.   Pt on R side of head when she cough.  Pt would like to est. care with PCP.  Pt reports taken meds today

## 2015-07-08 NOTE — Patient Instructions (Signed)
Hypertension Hypertension, commonly called high blood pressure, is when the force of blood pumping through your arteries is too strong. Your arteries are the blood vessels that carry blood from your heart throughout your body. A blood pressure reading consists of a higher number over a lower number, such as 110/72. The higher number (systolic) is the pressure inside your arteries when your heart pumps. The lower number (diastolic) is the pressure inside your arteries when your heart relaxes. Ideally you want your blood pressure below 120/80. Hypertension forces your heart to work harder to pump blood. Your arteries may become narrow or stiff. Having untreated or uncontrolled hypertension can cause heart attack, stroke, kidney disease, and other problems. RISK FACTORS Some risk factors for high blood pressure are controllable. Others are not.  Risk factors you cannot control include:   Race. You may be at higher risk if you are African American.  Age. Risk increases with age.  Gender. Men are at higher risk than women before age 45 years. After age 65, women are at higher risk than men. Risk factors you can control include:  Not getting enough exercise or physical activity.  Being overweight.  Getting too much fat, sugar, calories, or salt in your diet.  Drinking too much alcohol. SIGNS AND SYMPTOMS Hypertension does not usually cause signs or symptoms. Extremely high blood pressure (hypertensive crisis) may cause headache, anxiety, shortness of breath, and nosebleed. DIAGNOSIS To check if you have hypertension, your health care provider will measure your blood pressure while you are seated, with your arm held at the level of your heart. It should be measured at least twice using the same arm. Certain conditions can cause a difference in blood pressure between your right and left arms. A blood pressure reading that is higher than normal on one occasion does not mean that you need treatment. If  it is not clear whether you have high blood pressure, you may be asked to return on a different day to have your blood pressure checked again. Or, you may be asked to monitor your blood pressure at home for 1 or more weeks. TREATMENT Treating high blood pressure includes making lifestyle changes and possibly taking medicine. Living a healthy lifestyle can help lower high blood pressure. You may need to change some of your habits. Lifestyle changes may include:  Following the DASH diet. This diet is high in fruits, vegetables, and whole grains. It is low in salt, red meat, and added sugars.  Keep your sodium intake below 2,300 mg per day.  Getting at least 30-45 minutes of aerobic exercise at least 4 times per week.  Losing weight if necessary.  Not smoking.  Limiting alcoholic beverages.  Learning ways to reduce stress. Your health care provider may prescribe medicine if lifestyle changes are not enough to get your blood pressure under control, and if one of the following is true:  You are 18-59 years of age and your systolic blood pressure is above 140.  You are 60 years of age or older, and your systolic blood pressure is above 150.  Your diastolic blood pressure is above 90.  You have diabetes, and your systolic blood pressure is over 140 or your diastolic blood pressure is over 90.  You have kidney disease and your blood pressure is above 140/90.  You have heart disease and your blood pressure is above 140/90. Your personal target blood pressure may vary depending on your medical conditions, your age, and other factors. HOME CARE INSTRUCTIONS    Have your blood pressure rechecked as directed by your health care provider.   Take medicines only as directed by your health care provider. Follow the directions carefully. Blood pressure medicines must be taken as prescribed. The medicine does not work as well when you skip doses. Skipping doses also puts you at risk for  problems.  Do not smoke.   Monitor your blood pressure at home as directed by your health care provider. SEEK MEDICAL CARE IF:   You think you are having a reaction to medicines taken.  You have recurrent headaches or feel dizzy.  You have swelling in your ankles.  You have trouble with your vision. SEEK IMMEDIATE MEDICAL CARE IF:  You develop a severe headache or confusion.  You have unusual weakness, numbness, or feel faint.  You have severe chest or abdominal pain.  You vomit repeatedly.  You have trouble breathing. MAKE SURE YOU:   Understand these instructions.  Will watch your condition.  Will get help right away if you are not doing well or get worse.   This information is not intended to replace advice given to you by your health care provider. Make sure you discuss any questions you have with your health care provider.   Document Released: 08/10/2005 Document Revised: 12/25/2014 Document Reviewed: 06/02/2013 Elsevier Interactive Patient Education 2016 Elsevier Inc.  

## 2015-07-09 LAB — BRAIN NATRIURETIC PEPTIDE: Brain Natriuretic Peptide: 117.7 pg/mL — ABNORMAL HIGH (ref 0.0–100.0)

## 2015-07-12 ENCOUNTER — Telehealth: Payer: Self-pay

## 2015-07-12 NOTE — Telephone Encounter (Signed)
CMA called patient, patient did not answer. Left a message for the patient to return my call asap.

## 2015-07-12 NOTE — Telephone Encounter (Signed)
-----   Message from Amanda Amao, MD sent at 07/09/2015  4:15 PM EST ----- Please inform the patient that labs are normal. Thank you. 

## 2015-07-15 ENCOUNTER — Telehealth: Payer: Self-pay | Admitting: Family Medicine

## 2015-07-15 ENCOUNTER — Telehealth: Payer: Self-pay | Admitting: Clinical

## 2015-07-15 ENCOUNTER — Other Ambulatory Visit: Payer: Self-pay

## 2015-07-15 MED ORDER — GABAPENTIN 300 MG PO CAPS: 300.0000 mg | ORAL_CAPSULE | Freq: Three times a day (TID) | ORAL | Status: DC

## 2015-07-15 MED ORDER — CYCLOBENZAPRINE HCL 10 MG PO TABS: 10.0000 mg | ORAL_TABLET | ORAL | Status: DC | PRN

## 2015-07-15 MED ORDER — TOPIRAMATE 50 MG PO TABS: 50.0000 mg | ORAL_TABLET | Freq: Two times a day (BID) | ORAL | Status: DC
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
## 2015-07-15 NOTE — Telephone Encounter (Signed)
Attempt to follow-up on patient, at [facility] number requested to be used by Ms. Khiev. No answer on phone line, no message left.

## 2015-07-24 ENCOUNTER — Encounter: Payer: Self-pay | Admitting: Family Medicine

## 2015-07-24 ENCOUNTER — Ambulatory Visit: Payer: Self-pay | Attending: Family Medicine | Admitting: Family Medicine

## 2015-07-24 VITALS — BP 132/86 | HR 85 | Temp 98.6°F | Resp 12 | Ht 68.0 in | Wt 189.6 lb

## 2015-07-24 DIAGNOSIS — G629 Polyneuropathy, unspecified: Secondary | ICD-10-CM | POA: Insufficient documentation

## 2015-07-24 DIAGNOSIS — K219 Gastro-esophageal reflux disease without esophagitis: Secondary | ICD-10-CM | POA: Insufficient documentation

## 2015-07-24 DIAGNOSIS — L603 Nail dystrophy: Secondary | ICD-10-CM | POA: Insufficient documentation

## 2015-07-24 DIAGNOSIS — I10 Essential (primary) hypertension: Secondary | ICD-10-CM | POA: Insufficient documentation

## 2015-07-24 DIAGNOSIS — R739 Hyperglycemia, unspecified: Secondary | ICD-10-CM | POA: Insufficient documentation

## 2015-07-24 DIAGNOSIS — G5600 Carpal tunnel syndrome, unspecified upper limb: Secondary | ICD-10-CM | POA: Insufficient documentation

## 2015-07-24 DIAGNOSIS — R51 Headache: Secondary | ICD-10-CM | POA: Insufficient documentation

## 2015-07-24 DIAGNOSIS — Z880 Allergy status to penicillin: Secondary | ICD-10-CM | POA: Insufficient documentation

## 2015-07-24 DIAGNOSIS — Z79899 Other long term (current) drug therapy: Secondary | ICD-10-CM | POA: Insufficient documentation

## 2015-07-24 DIAGNOSIS — F419 Anxiety disorder, unspecified: Secondary | ICD-10-CM | POA: Insufficient documentation

## 2015-07-24 DIAGNOSIS — M545 Low back pain: Secondary | ICD-10-CM | POA: Insufficient documentation

## 2015-07-24 DIAGNOSIS — J45909 Unspecified asthma, uncomplicated: Secondary | ICD-10-CM | POA: Insufficient documentation

## 2015-07-24 DIAGNOSIS — G8929 Other chronic pain: Secondary | ICD-10-CM | POA: Insufficient documentation

## 2015-07-24 DIAGNOSIS — M5440 Lumbago with sciatica, unspecified side: Secondary | ICD-10-CM

## 2015-07-24 DIAGNOSIS — F3162 Bipolar disorder, current episode mixed, moderate: Secondary | ICD-10-CM | POA: Insufficient documentation

## 2015-07-24 LAB — POCT GLYCOSYLATED HEMOGLOBIN (HGB A1C): Hemoglobin A1C: 4.9

## 2015-07-24 MED ORDER — HYDROXYZINE HCL 50 MG PO TABS: 50.0000 mg | ORAL_TABLET | Freq: Three times a day (TID) | ORAL | Status: DC | PRN

## 2015-07-24 MED ORDER — CYCLOBENZAPRINE HCL 10 MG PO TABS: 10.0000 mg | ORAL_TABLET | Freq: Two times a day (BID) | ORAL | Status: DC

## 2015-07-24 MED ORDER — GABAPENTIN 300 MG PO CAPS: 300.0000 mg | ORAL_CAPSULE | Freq: Three times a day (TID) | ORAL | Status: DC

## 2015-07-24 NOTE — Telephone Encounter (Signed)
Patient was in office today. She was given lab results. Patient is homeless
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
## 2015-07-24 NOTE — Telephone Encounter (Signed)
Patient was seen in office today, November 30th. Patient never called me back from 11/21. She was given her lab results at [facility]. Patient verbalized that she understood with no further questions.

## 2015-07-24 NOTE — Progress Notes (Signed)
Patient not sure why she is here She states she [facility] an [facility] on [facility]/15 She is asking for a cane to help her walk

## 2015-07-25 DIAGNOSIS — F319 Bipolar disorder, unspecified: Secondary | ICD-10-CM | POA: Insufficient documentation

## 2015-07-26 ENCOUNTER — Telehealth: Payer: Self-pay | Admitting: Clinical

## 2015-07-26 NOTE — Telephone Encounter (Signed)
Left HIPPA-compliant message for Ms. Mariel AloeDonna Laroche to call Asher MuirJamie from [employer] at [phone].

## 2015-07-29 ENCOUNTER — Ambulatory Visit: Payer: Self-pay | Attending: Podiatry

## 2015-07-30 ENCOUNTER — Ambulatory Visit: Payer: Self-pay | Attending: Family Medicine

## 2015-07-30 ENCOUNTER — Telehealth: Payer: Self-pay | Admitting: Family Medicine

## 2015-07-30 DIAGNOSIS — I119 Hypertensive heart disease without heart failure: Secondary | ICD-10-CM

## 2015-07-30 DIAGNOSIS — M549 Dorsalgia, unspecified: Secondary | ICD-10-CM

## 2015-07-30 NOTE — Telephone Encounter (Signed)
Patient is in the process of filling out the orange card today as a walk in. She will be needing a Referral for the orthopeadics for neuropathy and back pain. Please follow up with pt. Thank you.

## 2015-07-30 NOTE — Telephone Encounter (Signed)
Will route message to Dr. Venetia NightAmao regarding referral to orthopedics for back pain/neuropathy.

## 2015-07-31 NOTE — Telephone Encounter (Signed)
Patient is calling to check on the status of her referral. Patient mentioned that when she saw the podiatrist dr wagoner referred her and Arna Mediciora was supposed to get back with her. I will route this message to Arna Mediciora as well just in case she has any updates. [facility] as [facility] does not have a phone. Thank you.

## 2015-07-31 NOTE — Telephone Encounter (Signed)
Verbal order given to Tobi Bastosnna to place a referral

## 2015-07-31 NOTE — Telephone Encounter (Signed)
I can't create the referral . I process the referral waiting for pcp so I can referral patient to Orthopedics  Thank You

## 2015-08-01 NOTE — Telephone Encounter (Signed)
Referral sent to referral coordinator Cheryll DessertNora Soler.

## 2015-08-02 NOTE — Congregational Nurse Program (Signed)
Client in manic state. Has not been taking prescribed medications for her bipolar disease.  Client states,"I have them but I have not filled them yet."  Sat with client along with Terrence Pleasants from  the [facility].  Client very angry and manic verbal de-escalation implemented from [employer]eth Keyanni Whittinghill, RN and [employer]rence Pleasants.  Client became diaphoretic and veins were dilated in neck.  Client agreed to get blood pressure taken and blood pressure was 185/120.  Gar GibbonMichelle Kennedy informed of client condition. Client denied headache or numbness in extremities.  EMS was notified.   Takirah Binford,RN

## 2015-08-05 NOTE — Congregational Nurse Program (Signed)
Congregational Nurse Program Note  Date of Encounter: 07/30/2015  Past Medical History: Past Medical History  Diagnosis Date  . Depression   . Anxiety   . Carpal tunnel syndrome, bilateral   . GERD (gastroesophageal reflux disease)   . Neuropathy (HCC)   . Hypertension   . Asthma   . PUD (peptic ulcer disease)   . Bipolar disorder [facility](HCC)     Encounter Details:     CNP Questionnaire - 08/05/15 1318    Patient Demographics   Is this a new or existing patient? Existing   Patient is considered a/an Not Applicable   Patient Assistance   Patient's financial/insurance status Uninsured;Low Income   Patient referred to apply for the following financial assistance [employer]   Food insecurities addressed Provided food supplies   Transportation assistance No   Assistance securing medications No   Educational health offerings Not Applicable   Encounter Details   Primary purpose of visit Chronic Illness/Condition Visit   Was an Emergency Department visit averted? Not Applicable   Does patient have a medical provider? No   Patient referred to Not Applicable   Was a mental health screening completed? (GAINS tool) No   Does patient have dental issues? No   Since previous encounter, have you referred patient for abnormal blood pressure that resulted in a new diagnosis or medication change? No   Since previous encounter, have you referred patient for abnormal blood glucose that resulted in a new diagnosis or medication change? No   For Abstraction Use Only   Does patient have insurance? No         Amb Nursing Assessment - 07/24/15 1627    Pre-visit preparation   Pre-visit preparation completed Yes   Pain Assessment   Pain Assessment 0-10   Pain Score 8    Pain Type Chronic pain   Pain Location Leg   Pain Orientation Right;Left   Pain Descriptors / Indicators Throbbing   Pain Onset More than a month ago   Pain Frequency Intermittent   Nutrition Screen   Diabetes  No   Functional Status   Activities of Daily Living Independent   Ambulation Independent   Medication Administration Independent   Home Management Independent   Risk/Barriers  Assessment   Barriers to Care Management & Learning Mental health   Psychosocial Barriers Uninsured/under-insured   Product managerLanguage Assistant   Interpreter Needed? No    Clinic visit for B/P check.  B/P is elevated.  [facility]  Encouraged her to RTC for B/P monitoring

## 2015-08-06 ENCOUNTER — Encounter: Payer: Self-pay | Admitting: Family Medicine

## 2015-08-06 ENCOUNTER — Ambulatory Visit: Payer: Self-pay | Attending: Family Medicine | Admitting: Family Medicine

## 2015-08-06 VITALS — BP 131/83 | HR 87 | Temp 98.2°F | Resp 16 | Ht 68.0 in | Wt 189.0 lb

## 2015-08-06 DIAGNOSIS — F419 Anxiety disorder, unspecified: Secondary | ICD-10-CM | POA: Insufficient documentation

## 2015-08-06 DIAGNOSIS — J4520 Mild intermittent asthma, uncomplicated: Secondary | ICD-10-CM

## 2015-08-06 DIAGNOSIS — G629 Polyneuropathy, unspecified: Secondary | ICD-10-CM

## 2015-08-06 DIAGNOSIS — I10 Essential (primary) hypertension: Secondary | ICD-10-CM

## 2015-08-06 DIAGNOSIS — G5600 Carpal tunnel syndrome, unspecified upper limb: Secondary | ICD-10-CM

## 2015-08-06 DIAGNOSIS — G43709 Chronic migraine without aura, not intractable, without status migrainosus: Secondary | ICD-10-CM

## 2015-08-06 DIAGNOSIS — F319 Bipolar disorder, unspecified: Secondary | ICD-10-CM | POA: Insufficient documentation

## 2015-08-06 DIAGNOSIS — Z880 Allergy status to penicillin: Secondary | ICD-10-CM | POA: Insufficient documentation

## 2015-08-06 DIAGNOSIS — M549 Dorsalgia, unspecified: Secondary | ICD-10-CM | POA: Insufficient documentation

## 2015-08-06 DIAGNOSIS — K219 Gastro-esophageal reflux disease without esophagitis: Secondary | ICD-10-CM

## 2015-08-06 DIAGNOSIS — G43909 Migraine, unspecified, not intractable, without status migrainosus: Secondary | ICD-10-CM | POA: Insufficient documentation

## 2015-08-06 DIAGNOSIS — J45909 Unspecified asthma, uncomplicated: Secondary | ICD-10-CM | POA: Insufficient documentation

## 2015-08-06 DIAGNOSIS — F3162 Bipolar disorder, current episode mixed, moderate: Secondary | ICD-10-CM

## 2015-08-06 DIAGNOSIS — M545 Low back pain, unspecified: Secondary | ICD-10-CM

## 2015-08-06 DIAGNOSIS — Z79899 Other long term (current) drug therapy: Secondary | ICD-10-CM | POA: Insufficient documentation

## 2015-08-06 MED ORDER — CETIRIZINE HCL 10 MG PO TABS: 10.0000 mg | ORAL_TABLET | Freq: Every day | ORAL | Status: DC

## 2015-08-06 MED ORDER — CYCLOBENZAPRINE HCL 10 MG PO TABS: 10.0000 mg | ORAL_TABLET | Freq: Two times a day (BID) | ORAL | Status: DC

## 2015-08-06 MED ORDER — LISINOPRIL 20 MG PO TABS: 20.0000 mg | ORAL_TABLET | Freq: Every day | ORAL | Status: DC

## 2015-08-06 MED ORDER — ALBUTEROL SULFATE HFA 108 (90 BASE) MCG/ACT IN AERS: 1.0000 | INHALATION_SPRAY | Freq: Four times a day (QID) | RESPIRATORY_TRACT | Status: DC | PRN

## 2015-08-06 MED ORDER — MELOXICAM 15 MG PO TABS: 15.0000 mg | ORAL_TABLET | Freq: Every day | ORAL | Status: DC

## 2015-08-06 MED ORDER — OMEPRAZOLE 20 MG PO CPDR: 20.0000 mg | DELAYED_RELEASE_CAPSULE | Freq: Every day | ORAL | Status: DC

## 2015-08-06 MED ORDER — TOPIRAMATE 50 MG PO TABS: 50.0000 mg | ORAL_TABLET | Freq: Two times a day (BID) | ORAL | Status: DC

## 2015-08-06 MED ORDER — CLONIDINE HCL 0.1 MG PO TABS: 0.1000 mg | ORAL_TABLET | Freq: Every day | ORAL | Status: DC

## 2015-08-06 MED ORDER — ATENOLOL 50 MG PO TABS: 50.0000 mg | ORAL_TABLET | Freq: Two times a day (BID) | ORAL | Status: DC

## 2015-08-06 NOTE — Progress Notes (Signed)
Subjective:    Patient ID: Amanda [name], female    DOB: December 10, 1965, 49 y.o.   MRN: [id number]  HPI 49 year old female with a history of hypertension, bipolar disorder, neuropathy, chronic low back pain here with all her medications except Neurontin and is needing refills.  She continues to complain of low back pain is chronic and neuropathy with tingling and numbness in her hands and her feet for which she takes Neurontin and she has an upcoming appointment with orthopedics at the end of the month . She complains that the neuropathy affects her whole body and makes her not "feels the ground when she steps on it".  She recently had an appointment at [facility] where she was managed for her bipolar disorder but she forgot to ask them for refills of her medications. She does have asthma which really flares up and she only uses her albuterol MDI as needed.  Past Medical History  Diagnosis Date  . Depression   . Anxiety   . Carpal tunnel syndrome, bilateral   . GERD (gastroesophageal reflux disease)   . Neuropathy (HCC)   . Hypertension   . Asthma   . PUD (peptic ulcer disease)   . Bipolar disorder [facility])     Past Surgical History  Procedure Laterality Date  . Rotator cuff repair Right 2011  . Hammer toe fusion Bilateral 2008  . Carpal tunnel release Right 2003  . Cholecystectomy N/A 02/09/2014    Procedure: LAPAROSCOPIC CHOLECYSTECTOMY WITH INTRAOPERATIVE CHOLANGIOGRAM;  Surgeon: Valarie Merino, MD;  Location: [facility];  Service: General;  Laterality: N/A;    Social History   Social History  . Marital Status: Single    Spouse Name: N/A  . Number of Children: N/A  . Years of Education: N/A   Occupational History  . [name]   Social History Main Topics  . Smoking status: Never Smoker   . Smokeless tobacco: Not on file  . Alcohol Use: No     Comment: 6 pack to a case of beer a day 07/24/15 reports no ETOH 3 weeks  . Drug Use: No     Comment: 07/24/15 patient  states she has not used in 3 weeks  . Sexual Activity: No   Other Topics Concern  . Not on file   Social History Narrative   Pt lives at home alone. She has a HS education level and does not have any children.    She drinks 2 cups of caffeine daily.    Allergies  Allergen Reactions  . Penicillins Anaphylaxis    Has patient had a PCN reaction causing immediate rash, facial/tongue/throat swelling, SOB or lightheadedness with hypotension: Yes Has patient had a PCN reaction causing severe rash involving mucus membranes or skin necrosis: Yes Has patient had a PCN reaction that required hospitalization Yes Has patient had a PCN reaction occurring within the last 10 years: No-more than 10 years ago If all of the above answers are "NO", then may proceed with Cephalosporin use.     Current Outpatient Prescriptions on File Prior to Visit  Medication Sig Dispense Refill  . ALPRAZolam (XANAX XR) 0.5 MG 24 hr tablet Take 0.5 mg by mouth 3 (three) times daily.     Marland Kitchen gabapentin (NEURONTIN) 300 MG capsule Take 1 capsule (300 mg total) by mouth 3 (three) times daily. 90 capsule 2  . hydrOXYzine (ATARAX/VISTARIL) 50 MG tablet Take 1 tablet (50 mg total) by mouth 3 (three) times daily  as needed for anxiety or itching. 90 tablet 0  . mirtazapine (REMERON) 30 MG tablet Take 45 mg by mouth at bedtime.    . naltrexone (DEPADE) 50 MG tablet Take 0.5 tablets (25 mg total) by mouth daily. 30 tablet 0  . nicotine (NICODERM CQ - DOSED IN MG/24 HOURS) 21 mg/24hr patch Place 1 patch (21 mg total) onto the skin daily. 28 patch 0  . omeprazole (PRILOSEC OTC) 20 MG tablet Take 20 mg by mouth daily.    . traZODone (DESYREL) 300 MG tablet Take 1 tablet (300 mg total) by mouth at bedtime. 30 tablet 0   No current facility-administered medications on file prior to visit.    Review of Systems Constitutional: Negative for fever, chills, diaphoresis, activity change, appetite change and fatigue. HENT: Negative for ear  pain, nosebleeds, congestion, facial swelling, rhinorrhea, neck pain, neck stiffness and ear discharge.  Eyes: Negative for pain, discharge, redness, itching and visual disturbance. Respiratory: Negative for cough, choking, chest tightness, shortness of breath, wheezing and stridor.  Cardiovascular: Negative for chest pain, palpitations and leg swelling. Gastrointestinal: Negative for abdominal distention. Genitourinary: Negative for dysuria, urgency, frequency, hematuria, flank pain, decreased urine volume, difficulty urinating and dyspareunia.  Musculoskeletal: positive for wrist pain Neurological: Negative for dizziness, tremors, seizures, syncope, facial asymmetry, speech difficulty, weakness, light-headedness, positive for numbness and headaches.  Hematological: Negative for adenopathy. Does not bruise/bleed easily. Psychiatric/Behavioral: Negative for hallucinations, behavioral problems, confusion, dysphoric mood, decreased concentration and agitation.    Objective: Filed Vitals:   08/06/15 1452  BP: 131/83  Pulse: 87  Temp: 98.2 F (36.8 C)  TempSrc: Oral  Resp: 16  Height: 5\' 8"  (1.727 m)  Weight: 189 lb (85.73 kg)  SpO2: 99%      Physical Exam  Constitutional: Patient appears well-developed and well-nourished. No distress. Neck: Normal ROM. Neck supple. No JVD. No tracheal deviation. No thyromegaly. CVS: RRR, S1/S2 +, no murmurs, no gallops, no carotid bruit.  Pulmonary: Effort and breath sounds normal, no stridor, rhonchi, wheezes, rales.  Abdominal: Soft. BS +,  no distension, tenderness, rebound or guarding.  Musculoskeletal: Normal range of motion, tenderness of bilateral wrist on range of motion.  Lymphadenopathy: No lymphadenopathy noted, cervical, inguinal or axillary Neuro: Alert. Normal reflexes, muscle tone coordination. No cranial nerve deficit. Psychiatric: Normal mood and affect. Behavior, judgment, thought content normal       Assessment & Plan:    Hypertension: Controlled   Bipolar Depression and anxiety: I will hold off on refilling behavioral health medications and have advised her to contact [facility] to have them refilled supposed to ensure consistency   Chronic low back pain and Neuropathy: Continue Neurontin  She has upcoming appointment with orthopedics.   Migraines/Headaches: Controlled on Topamax  Asthma: Intermittent with no frequent flares.  GERD: Controlled on PPI

## 2015-08-06 NOTE — Progress Notes (Signed)
Patients requesting medication refills.  Patient concern of her neuropathy, rated pain at 8/10. Describes pain as pin needles in feet and achy over entire body.

## 2015-09-05 MED FILL — GABAPENTIN 300 MG CAPSULE: 300 | 20 days supply | Qty: 180 | Fill #0

## 2015-09-18 ENCOUNTER — Telehealth: Payer: Self-pay | Admitting: Clinical

## 2015-09-18 MED FILL — VENTOLIN HFA 90 MCG INHALER: 108 (90 BAS | 25 days supply | Qty: 18 | Fill #1

## 2015-09-18 MED FILL — ?DICLOFENAC SOD DR 75 MG TA: 75 | 30 days supply | Qty: 60 | Fill #1

## 2015-09-18 MED FILL — ?OMEPRAZOLE DR 20 MG CAPSUL: 20 | 30 days supply | Qty: 30 | Fill #1

## 2015-09-18 MED FILL — traZODone HCL 100 MG TABS: 100 | 10 days supply | Qty: 30 | Fill #0

## 2015-09-18 MED FILL — TOPIRAMATE 50 MG TABLET: 50 | 30 days supply | Qty: 60 | Fill #1

## 2015-09-18 MED FILL — ALL DAY ALLERGY 10 MG TAB: 10 | 30 days supply | Qty: 30 | Fill #1

## 2015-09-18 MED FILL — ?ATENOLOL 50 MG TABLET: 50 | 30 days supply | Qty: 60 | Fill #1

## 2015-09-18 MED FILL — CYCLOBENZAPRINE 10 MG TAB: 10 | 30 days supply | Qty: 60 | Fill #0

## 2015-09-18 MED FILL — ?LISINOPRIL 20 MG TABLET: 20 | 30 days supply | Qty: 30 | Fill #1

## 2015-09-18 MED FILL — GABAPENTIN 600 MG TABLET: 600 | 30 days supply | Qty: 90 | Fill #1

## 2015-09-18 MED FILL — cloNIDine HCL 0.1 MG TABS: 0.1 | 30 days supply | Qty: 30 | Fill #1
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
## 2015-09-20 ENCOUNTER — Encounter (HOSPITAL_COMMUNITY): Payer: Self-pay | Admitting: Emergency Medicine

## 2015-09-20 ENCOUNTER — Emergency Department (INDEPENDENT_AMBULATORY_CARE_PROVIDER_SITE_OTHER)
Admission: EM | Admit: 2015-09-20 | Discharge: 2015-09-20 | Disposition: A | Discharge status: Home / Self Care | Payer: Self-pay | Source: Home / Self Care | Attending: Family Medicine | Admitting: Family Medicine

## 2015-09-20 ENCOUNTER — Emergency Department (INDEPENDENT_AMBULATORY_CARE_PROVIDER_SITE_OTHER): Payer: No Typology Code available for payment source

## 2015-09-20 DIAGNOSIS — M778 Other enthesopathies, not elsewhere classified: Secondary | ICD-10-CM

## 2015-09-20 NOTE — ED Provider Notes (Signed)
CSN: [id number]     Arrival date & time 09/20/15  1706 History   First MD Initiated Contact with Patient 09/20/15 1725     Chief Complaint  Patient presents with  . Wrist Pain   (Consider location/radiation/quality/duration/timing/severity/associated sxs/prior Treatment) HPI Pt states she fractured her wrist last summer, cast removed in August. Has been doing well. 2 weeks ago, reached for an object and felt a pop in her left wrist and has had pain since. She states that she believes she fractured her wrist again. No meds at home are helping with her symptoms.  Past Medical History  Diagnosis Date  . Depression   . Anxiety   . Carpal tunnel syndrome, bilateral   . GERD (gastroesophageal reflux disease)   . Neuropathy (HCC)   . Hypertension   . Asthma   . PUD (peptic ulcer disease)   . Bipolar disorder [facility])    Past Surgical History  Procedure Laterality Date  . Rotator cuff repair Right 2011  . Hammer toe fusion Bilateral 2008  . Carpal tunnel release Right 2003  . Cholecystectomy N/A 02/09/2014    Procedure: LAPAROSCOPIC CHOLECYSTECTOMY WITH INTRAOPERATIVE CHOLANGIOGRAM;  Surgeon: Valarie Merino, MD;  Location: [facility];  Service: General;  Laterality: N/A;   Family History  Problem Relation Age of Onset  . Cancer Mother     pancreatic  . Heart failure Father   . Cancer Paternal Grandmother   . HIV/AIDS Brother    Social History  Substance Use Topics  . Smoking status: Never Smoker   . Smokeless tobacco: None  . Alcohol Use: No     Comment: 6 pack to a case of beer a day 07/24/15 reports no ETOH 3 weeks   OB History    No data available     Review of Systems ROS +'ve left wrist injury/pain  Denies: HEADACHE, NAUSEA, ABDOMINAL PAIN, CHEST PAIN, CONGESTION, DYSURIA, SHORTNESS OF BREATH  Allergies  Penicillins  Home Medications   Prior to Admission medications   Medication Sig Start Date End Date Taking? Authorizing Provider  albuterol (PROVENTIL HFA;VENTOLIN  HFA) 108 (90 BASE) MCG/ACT inhaler Inhale 1-2 puffs into the lungs every 6 (six) hours as needed for wheezing or shortness of breath. 08/06/15   Jaclyn Shaggy, MD  ALPRAZolam (XANAX XR) 0.5 MG 24 hr tablet Take 0.5 mg by mouth 3 (three) times daily.     Historical Provider, MD  atenolol (TENORMIN) 50 MG tablet Take 1 tablet (50 mg total) by mouth 2 (two) times daily. 08/06/15   Jaclyn Shaggy, MD  cetirizine (ZYRTEC) 10 MG tablet Take 1 tablet (10 mg total) by mouth daily. 08/06/15   Jaclyn Shaggy, MD  cloNIDine (CATAPRES) 0.1 MG tablet Take 1 tablet (0.1 mg total) by mouth at bedtime. 08/06/15   Jaclyn Shaggy, MD  cyclobenzaprine (FLEXERIL) 10 MG tablet Take 1 tablet (10 mg total) by mouth 2 (two) times daily. 08/06/15   Jaclyn Shaggy, MD  gabapentin (NEURONTIN) 300 MG capsule Take 1 capsule (300 mg total) by mouth 3 (three) times daily. 07/24/15   Jaclyn Shaggy, MD  hydrOXYzine (ATARAX/VISTARIL) 50 MG tablet Take 1 tablet (50 mg total) by mouth 3 (three) times daily as needed for anxiety or itching. 07/24/15   Jaclyn Shaggy, MD  lisinopril (PRINIVIL,ZESTRIL) 20 MG tablet Take 1 tablet (20 mg total) by mouth daily. 08/06/15   Jaclyn Shaggy, MD  meloxicam (MOBIC) 15 MG tablet Take 1 tablet (15 mg total) by mouth daily. 08/06/15   Enobong  Amao, MD  mirtazapine (REMERON) 30 MG tablet Take 45 mg by mouth at bedtime.    Historical Provider, MD  naltrexone (DEPADE) 50 MG tablet Take 0.5 tablets (25 mg total) by mouth daily. 11/21/14   Adonis Brook, NP  nicotine (NICODERM CQ - DOSED IN MG/24 HOURS) 21 mg/24hr patch Place 1 patch (21 mg total) onto the skin daily. 11/21/14   Adonis Brook, NP  omeprazole (PRILOSEC OTC) 20 MG tablet Take 20 mg by mouth daily.    Historical Provider, MD  omeprazole (PRILOSEC) 20 MG capsule Take 1 capsule (20 mg total) by mouth daily. 08/06/15   Jaclyn Shaggy, MD  topiramate (TOPAMAX) 50 MG tablet Take 1 tablet (50 mg total) by mouth 2 (two) times daily. Patient 08/06/15   Jaclyn Shaggy,  MD  traZODone (DESYREL) 300 MG tablet Take 1 tablet (300 mg total) by mouth at bedtime. 11/21/14   Adonis Brook, NP   Meds Ordered and Administered this Visit  Medications - No data to display  BP 130/82 mmHg  Pulse 81  Temp(Src) 97.6 F (36.4 C) (Oral)  Resp 20  SpO2 100% No data found.   Physical Exam  Constitutional: She appears well-developed and well-nourished. No distress.  Musculoskeletal: She exhibits tenderness.       Left wrist: She exhibits tenderness and swelling.  Nursing note and vitals reviewed.   ED Course  Procedures (including critical care time)  Labs Review Labs Reviewed - No data to display  Imaging Review Dg Wrist Complete Left  09/20/2015  CLINICAL DATA:  Lifted a bag 2 weeks ago felt a pop in left wrist. Radial pain. EXAM: LEFT WRIST - COMPLETE 3+ VIEW COMPARISON:  12/04/2014 FINDINGS: There is no evidence of fracture or dislocation. There is no evidence of arthropathy or other focal bone abnormality. Soft tissues are unremarkable. IMPRESSION: Negative. Electronically Signed   By: Charlett Nose M.D.   On: 09/20/2015 18:00     Visual Acuity Review  Right Eye Distance:   Left Eye Distance:   Bilateral Distance:    Right Eye Near:   Left Eye Near:    Bilateral Near:        Result of x-ray discussed with patient MDM   1. Left wrist tendonitis    Wrist splint applied by nursing staff.  Heat Ibuprofen Follow up with Ortho if new or worsening of symptoms Advised that symptoms may persist for several weeks.     Tharon Aquas, PA 09/20/15 [phone]

## 2015-09-20 NOTE — Discharge Instructions (Signed)
De Quervain Disease De Quervain disease is inflammation of the tendon on the thumb side of the wrist. Tendons are cords of tissue that connect bones to muscles. The tendons in your hand pass through a tunnel, or sheath. A slippery layer of tissue (synovium) lets the tendons move smoothly in the sheath. With de Quervain disease, the sheath swells or thickens, causing friction and pain. The condition is also called de Quervain tendinosis and de Quervain syndrome. It occurs most often in women who are 30-50 years old. CAUSES  The exact cause of de Quervain disease is not known. It may result from:   Overusing your hands, especially with repetitive motions that involve twisting your hand or using a forceful grip.  Pregnancy.  Rheumatoid disease. RISK FACTORS You may have a greater risk for de Quervain disease if you:  Are a middle-aged woman.  Are pregnant.  Have rheumatoid arthritis.  Have diabetes.  Use your hands far more than normal, especially with a tight grip or excessive twisting. SIGNS AND SYMPTOMS Pain on the thumb side of your wrist is the main symptom of de Quervain disease. Other signs and symptoms include:  Pain that gets worse when you grasp something or turn your wrist.  Pain that extends up the forearm.  Cysts in the area of the pain.  Swelling of your wrist and hand.  A sensation of snapping in the wrist.  Trouble moving the thumb and wrist. DIAGNOSIS  Your health care provider may diagnose de Quervain disease based on your signs and symptoms. A physical exam will also be done. A simple test (Finkelstein test) that involves pulling your thumb and wrist to see if this causes pain can help determine whether you have the condition. Sometimes you may need to have an X-ray.  TREATMENT  Avoiding any activity that causes pain and swelling is the best treatment. Other options include:  Wearing a splint.  Taking medicine. Anti-inflammatory medicines and corticosteroid  injections may reduce inflammation and relieve pain.  Having surgery if other treatments do not work. HOME CARE INSTRUCTIONS   Using ice can be helpful after doing activities that involve the sore wrist. To apply ice to the injured area:  Put ice in a plastic bag.  Place a towel between your skin and the bag.  Leave the ice on for 20 minutes, 2-3 times a day.  Take medicines only as directed by your health care provider.  Wear your splint as directed. This will allow your hand to rest and heal. SEEK MEDICAL CARE IF:   Your pain medicine does not help.   Your pain gets worse.  You develop new symptoms. MAKE SURE YOU:   Understand these instructions.  Will watch your condition.  Will get help right away if you are not doing well or get worse.   This information is not intended to replace advice given to you by your health care provider. Make sure you discuss any questions you have with your health care provider.   Document Released: 05/05/2001 Document Revised: 08/31/2014 Document Reviewed: 12/13/2013 Elsevier Interactive Patient Education 2016 Elsevier Inc.  

## 2015-09-20 NOTE — ED Notes (Signed)
Pt here with left wrist pain and swelling 2 weeks ago States she heard a pop  Wearing brace for support, ice and heat

## 2015-09-27 ENCOUNTER — Encounter: Payer: Self-pay | Admitting: *Deleted

## 2015-10-01 ENCOUNTER — Ambulatory Visit: Payer: Self-pay | Admitting: Diagnostic Neuroimaging

## 2015-10-02 ENCOUNTER — Encounter: Payer: Self-pay | Admitting: Diagnostic Neuroimaging

## 2015-10-02 ENCOUNTER — Emergency Department (HOSPITAL_COMMUNITY)
Admission: EM | Admit: 2015-10-02 | Discharge: 2015-10-02 | Disposition: A | Discharge status: Home / Self Care | Payer: No Typology Code available for payment source | Attending: Emergency Medicine | Admitting: Emergency Medicine

## 2015-10-02 ENCOUNTER — Encounter (HOSPITAL_COMMUNITY): Payer: Self-pay | Admitting: Emergency Medicine

## 2015-10-02 DIAGNOSIS — S3992XA Unspecified injury of lower back, initial encounter: Secondary | ICD-10-CM | POA: Insufficient documentation

## 2015-10-02 DIAGNOSIS — I10 Essential (primary) hypertension: Secondary | ICD-10-CM | POA: Insufficient documentation

## 2015-10-02 DIAGNOSIS — M25511 Pain in right shoulder: Secondary | ICD-10-CM

## 2015-10-02 DIAGNOSIS — T148 Other injury of unspecified body region: Secondary | ICD-10-CM | POA: Insufficient documentation

## 2015-10-02 DIAGNOSIS — Z79891 Long term (current) use of opiate analgesic: Secondary | ICD-10-CM | POA: Insufficient documentation

## 2015-10-02 DIAGNOSIS — F319 Bipolar disorder, unspecified: Secondary | ICD-10-CM | POA: Insufficient documentation

## 2015-10-02 DIAGNOSIS — Y92410 Unspecified street and highway as the place of occurrence of the external cause: Secondary | ICD-10-CM | POA: Insufficient documentation

## 2015-10-02 DIAGNOSIS — Z880 Allergy status to penicillin: Secondary | ICD-10-CM | POA: Insufficient documentation

## 2015-10-02 DIAGNOSIS — K219 Gastro-esophageal reflux disease without esophagitis: Secondary | ICD-10-CM | POA: Insufficient documentation

## 2015-10-02 DIAGNOSIS — G629 Polyneuropathy, unspecified: Secondary | ICD-10-CM | POA: Insufficient documentation

## 2015-10-02 DIAGNOSIS — Y9389 Activity, other specified: Secondary | ICD-10-CM | POA: Insufficient documentation

## 2015-10-02 DIAGNOSIS — M545 Low back pain, unspecified: Secondary | ICD-10-CM

## 2015-10-02 DIAGNOSIS — S4991XA Unspecified injury of right shoulder and upper arm, initial encounter: Secondary | ICD-10-CM | POA: Insufficient documentation

## 2015-10-02 DIAGNOSIS — J45909 Unspecified asthma, uncomplicated: Secondary | ICD-10-CM | POA: Insufficient documentation

## 2015-10-02 DIAGNOSIS — Z79899 Other long term (current) drug therapy: Secondary | ICD-10-CM | POA: Insufficient documentation

## 2015-10-02 DIAGNOSIS — Z791 Long term (current) use of non-steroidal anti-inflammatories (NSAID): Secondary | ICD-10-CM | POA: Insufficient documentation

## 2015-10-02 DIAGNOSIS — Z8711 Personal history of peptic ulcer disease: Secondary | ICD-10-CM | POA: Insufficient documentation

## 2015-10-02 DIAGNOSIS — F419 Anxiety disorder, unspecified: Secondary | ICD-10-CM | POA: Insufficient documentation

## 2015-10-02 DIAGNOSIS — Y998 Other external cause status: Secondary | ICD-10-CM | POA: Insufficient documentation

## 2015-10-02 MED ORDER — KETOROLAC TROMETHAMINE 30 MG/ML IJ SOLN
30.0000 mg | Freq: Once | INTRAMUSCULAR | Status: DC
  Filled 2015-10-02: qty 1

## 2015-10-02 MED ORDER — METHOCARBAMOL 500 MG PO TABS
750.0000 mg | ORAL_TABLET | Freq: Once | ORAL | Status: AC
  Administered 2015-10-02: 750 mg via ORAL
  Filled 2015-10-02: qty 2

## 2015-10-02 MED ORDER — METHOCARBAMOL 500 MG PO TABS: 500.0000 mg | ORAL_TABLET | Freq: Two times a day (BID) | ORAL | Status: DC
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
## 2015-10-04 ENCOUNTER — Emergency Department (HOSPITAL_COMMUNITY)
Admission: EM | Admit: 2015-10-04 | Discharge: 2015-10-05 | Disposition: A | Discharge status: Home / Self Care | Payer: Self-pay | Attending: Emergency Medicine | Admitting: Emergency Medicine

## 2015-10-04 ENCOUNTER — Encounter (HOSPITAL_COMMUNITY): Payer: Self-pay | Admitting: Oncology

## 2015-10-04 ENCOUNTER — Emergency Department (HOSPITAL_COMMUNITY): Payer: Self-pay

## 2015-10-04 DIAGNOSIS — Z8669 Personal history of other diseases of the nervous system and sense organs: Secondary | ICD-10-CM | POA: Insufficient documentation

## 2015-10-04 DIAGNOSIS — K219 Gastro-esophageal reflux disease without esophagitis: Secondary | ICD-10-CM | POA: Insufficient documentation

## 2015-10-04 DIAGNOSIS — Z79899 Other long term (current) drug therapy: Secondary | ICD-10-CM | POA: Insufficient documentation

## 2015-10-04 DIAGNOSIS — Z880 Allergy status to penicillin: Secondary | ICD-10-CM | POA: Insufficient documentation

## 2015-10-04 DIAGNOSIS — I10 Essential (primary) hypertension: Secondary | ICD-10-CM | POA: Insufficient documentation

## 2015-10-04 DIAGNOSIS — J45909 Unspecified asthma, uncomplicated: Secondary | ICD-10-CM | POA: Insufficient documentation

## 2015-10-04 DIAGNOSIS — Z79891 Long term (current) use of opiate analgesic: Secondary | ICD-10-CM | POA: Insufficient documentation

## 2015-10-04 DIAGNOSIS — F419 Anxiety disorder, unspecified: Secondary | ICD-10-CM | POA: Insufficient documentation

## 2015-10-04 DIAGNOSIS — F319 Bipolar disorder, unspecified: Secondary | ICD-10-CM | POA: Insufficient documentation

## 2015-10-04 DIAGNOSIS — M5410 Radiculopathy, site unspecified: Secondary | ICD-10-CM | POA: Insufficient documentation

## 2015-10-04 DIAGNOSIS — Z8711 Personal history of peptic ulcer disease: Secondary | ICD-10-CM | POA: Insufficient documentation

## 2015-10-04 NOTE — ED Notes (Signed)
Berneta Sages Tech informed.

## 2015-10-04 NOTE — ED Provider Notes (Signed)
CSN: [id number]     Arrival date & time 10/04/15  2151 History  By signing my name below, I, Gonzella Lex, attest that this documentation has been prepared under the direction and in the presence of Earley Favor, NP.Marland Kitchen Electronically Signed: Gonzella Lex, Scribe. 10/04/2015. 11:00 PM.  Chief Complaint  Patient presents with  . Wrist Pain   The history is provided by the patient. No language interpreter was used.   HPI Comments: Amanda [name] is a 50 y.o. female with a hx of neuropathy in her lower extremities and feet, who presents to the Emergency Department complaining of intermittent, gradually worsening, mild left wrist pain which has been ongoing since she broke it almost one year ago. Pt notes that the pain now radiates up her left arm and reports that she has not been able to hold a cup in her left hand. Pt reported to urgent care two weeks ago where an xray was performed and came back negative. She was told that her pain was most likely due to arthritis. Pt also notes that when it snowed about one month ago, she was picking something up when she heard a pop in her left wrist and she know she re broke it   Past Medical History  Diagnosis Date  . Depression   . Anxiety   . Carpal tunnel syndrome, bilateral   . GERD (gastroesophageal reflux disease)   . Neuropathy (HCC)   . Hypertension   . Asthma   . PUD (peptic ulcer disease)   . Bipolar disorder [facility])    Past Surgical History  Procedure Laterality Date  . Rotator cuff repair Right 2011  . Hammer toe fusion Bilateral 2008  . Carpal tunnel release Right 2003  . Cholecystectomy N/A 02/09/2014    Procedure: LAPAROSCOPIC CHOLECYSTECTOMY WITH INTRAOPERATIVE CHOLANGIOGRAM;  Surgeon: Valarie Merino, MD;  Location: [facility];  Service: General;  Laterality: N/A;   Family History  Problem Relation Age of Onset  . Cancer Mother     pancreatic  . Heart failure Father   . Cancer Paternal Grandmother   . HIV/AIDS Brother     Social History  Substance Use Topics  . Smoking status: Never Smoker   . Smokeless tobacco: Never Used  . Alcohol Use: No   OB History    No data available     Review of Systems  Constitutional: Negative for fever.  Musculoskeletal: Positive for myalgias and arthralgias.       Left wrist and arm  Skin: Negative for wound.  Neurological: Positive for weakness. Negative for numbness.  All other systems reviewed and are negative.   Allergies  Penicillins  Home Medications   Prior to Admission medications   Medication Sig Start Date End Date Taking? Authorizing Provider  albuterol (PROVENTIL HFA;VENTOLIN HFA) 108 (90 BASE) MCG/ACT inhaler Inhale 1-2 puffs into the lungs every 6 (six) hours as needed for wheezing or shortness of breath. 08/06/15   Jaclyn Shaggy, MD  ALPRAZolam (XANAX XR) 0.5 MG 24 hr tablet Take 0.5 mg by mouth 3 (three) times daily.     Historical Provider, MD  atenolol (TENORMIN) 50 MG tablet Take 1 tablet (50 mg total) by mouth 2 (two) times daily. 08/06/15   Jaclyn Shaggy, MD  cetirizine (ZYRTEC) 10 MG tablet Take 1 tablet (10 mg total) by mouth daily. 08/06/15   Jaclyn Shaggy, MD  cloNIDine (CATAPRES) 0.1 MG tablet Take 1 tablet (0.1 mg total) by mouth at bedtime. 08/06/15  Jaclyn Shaggy, MD  cyclobenzaprine (FLEXERIL) 10 MG tablet Take 1 tablet (10 mg total) by mouth 2 (two) times daily. 08/06/15   Jaclyn Shaggy, MD  gabapentin (NEURONTIN) 300 MG capsule Take 1 capsule (300 mg total) by mouth 3 (three) times daily. 07/24/15   Jaclyn Shaggy, MD  hydrOXYzine (ATARAX/VISTARIL) 50 MG tablet Take 1 tablet (50 mg total) by mouth 3 (three) times daily as needed for anxiety or itching. 07/24/15   Jaclyn Shaggy, MD  lisinopril (PRINIVIL,ZESTRIL) 20 MG tablet Take 1 tablet (20 mg total) by mouth daily. 08/06/15   Jaclyn Shaggy, MD  meloxicam (MOBIC) 15 MG tablet Take 1 tablet (15 mg total) by mouth daily. 08/06/15   Jaclyn Shaggy, MD  methocarbamol (ROBAXIN) 500 MG tablet  Take 1 tablet (500 mg total) by mouth 2 (two) times daily. 10/02/15   Ace Gins Sam, PA-C  mirtazapine (REMERON) 30 MG tablet Take 45 mg by mouth at bedtime.    Historical Provider, MD  naltrexone (DEPADE) 50 MG tablet Take 0.5 tablets (25 mg total) by mouth daily. 11/21/14   Adonis Brook, NP  nicotine (NICODERM CQ - DOSED IN MG/24 HOURS) 21 mg/24hr patch Place 1 patch (21 mg total) onto the skin daily. 11/21/14   Adonis Brook, NP  omeprazole (PRILOSEC OTC) 20 MG tablet Take 20 mg by mouth daily.    Historical Provider, MD  omeprazole (PRILOSEC) 20 MG capsule Take 1 capsule (20 mg total) by mouth daily. 08/06/15   Jaclyn Shaggy, MD  topiramate (TOPAMAX) 50 MG tablet Take 1 tablet (50 mg total) by mouth 2 (two) times daily. Patient 08/06/15   Jaclyn Shaggy, MD  traZODone (DESYREL) 300 MG tablet Take 1 tablet (300 mg total) by mouth at bedtime. 11/21/14   Adonis Brook, NP   BP 157/97 mmHg  Pulse 91  Temp(Src) 97.5 F (36.4 C) (Oral)  Resp 16  Ht  (1.727 m)  Wt 84.823 kg  BMI 28.44 kg/m2  SpO2 100%  LMP 09/18/2015 Physical Exam  Constitutional: She is oriented to person, place, and time. She appears well-developed and well-nourished. No distress.  HENT:  Head: Normocephalic and atraumatic.  Eyes: Conjunctivae are normal.  Neck: Normal range of motion. Neck supple.  Cardiovascular: Normal rate, regular rhythm and normal heart sounds.   Pulmonary/Chest: Effort normal and breath sounds normal.  Abdominal: Soft. She exhibits no distension. There is no tenderness.  Musculoskeletal: She exhibits tenderness. She exhibits no edema.       Left wrist: She exhibits decreased range of motion and tenderness. She exhibits no swelling, no effusion, no crepitus, no deformity and no laceration.       Arms: Neurological: She is alert and oriented to person, place, and time.  Skin: Skin is warm and dry.  Psychiatric: She has a normal mood and affect.  Nursing note and vitals reviewed.   ED Course   Procedures  DIAGNOSTIC STUDIES:    Oxygen Saturation is 100% on RA, normal by my interpretation.   COORDINATION OF CARE:  10:45 PM Will order xray of pt's left wrist. Discussed treatment plan with pt at bedside and pt agreed to plan.   Imaging Review Dg Wrist Complete Left  10/04/2015  CLINICAL DATA:  Chronic left ulnar wrist pain for 1 month. Heard pop in left wrist. Initial encounter. EXAM: LEFT WRIST - COMPLETE 3+ VIEW COMPARISON:  Left wrist radiographs from 09/20/2015 FINDINGS: There is no evidence of fracture or dislocation. The carpal rows are intact, and demonstrate normal alignment.  The joint spaces are preserved. Mild negative ulnar variance is noted. No significant soft tissue abnormalities are seen. IMPRESSION: No evidence of fracture or dislocation. Electronically Signed   By: Roanna Raider M.D.   On: 10/04/2015 23:22   I have personally reviewed and evaluated these images as part of my medical decision-making. Normal xray full  ROM but tender over the medial area without deformity  Patient is demanding explanation for her pain demanding copies of her x rays not only from tonight but pervious visitis  MDM   Final diagnoses:  Radiculopathy of arm    I personally performed the services described in this documentation, which was scribed in my presence. The recorded information has been reviewed and is accurate.   Earley Favor, NP 10/04/15 1610  Earley Favor, NP 10/04/15 9604  Earley Favor, NP 10/05/15 5409  Rolan Bucco, MD 10/05/15 8119

## 2015-10-04 NOTE — ED Notes (Signed)
In to d/c pt.  Pt stated "I want to see Dondra Spry again.  I want these medicines I'm not taking removed."  Dondra Spry informed & in to room.

## 2015-10-04 NOTE — ED Notes (Signed)
Berneta Sages Tech out of room.  Victorino Dike stated "can you print a new one without those meds."  Informed would speak to [facility].

## 2015-10-04 NOTE — ED Notes (Signed)
Pt c/o left wrist pain since she broke it almost a year ago.  Pt states that the pain is now radiating up her left arm.  Radial pulse is strong.  Pt reports being unable to hold a cup in her left hand.

## 2015-10-05 NOTE — Discharge Instructions (Signed)
Radicular Pain Radicular pain in either the arm or leg is usually from a bulging or herniated disk in the spine. A piece of the herniated disk may press against the nerves as the nerves exit the spine. This causes pain which is felt at the tips of the nerves down the arm or leg. Other causes of radicular pain may include:  Fractures.  Heart disease.  Cancer.  An abnormal and usually degenerative state of the nervous system or nerves (neuropathy). Diagnosis may require CT or MRI scanning to determine the primary cause.  Nerves that start at the neck (nerve roots) may cause radicular pain in the outer shoulder and arm. It can spread down to the thumb and fingers. The symptoms vary depending on which nerve root has been affected. In most cases radicular pain improves with conservative treatment. Neck problems may require physical therapy, a neck collar, or cervical traction. Treatment may take many weeks, and surgery may be considered if the symptoms do not improve.  Conservative treatment is also recommended for sciatica. Sciatica causes pain to radiate from the lower back or buttock area down the leg into the foot. Often there is a history of back problems. Most patients with sciatica are better after 2 to 4 weeks of rest and other supportive care. Short term bed rest can reduce the disk pressure considerably. Sitting, however, is not a good position since this increases the pressure on the disk. You should avoid bending, lifting, and all other activities which make the problem worse. Traction can be used in severe cases. Surgery is usually reserved for patients who do not improve within the first months of treatment. Only take over-the-counter or prescription medicines for pain, discomfort, or fever as directed by your caregiver. Narcotics and muscle relaxants may help by relieving more severe pain and spasm and by providing mild sedation. Cold or massage can give significant relief. Spinal manipulation  is not recommended. It can increase the degree of disc protrusion. Epidural steroid injections are often effective treatment for radicular pain. These injections deliver medicine to the spinal nerve in the space between the protective covering of the spinal cord and back bones (vertebrae). Your caregiver can give you more information about steroid injections. These injections are most effective when given within two weeks of the onset of pain.  You should see your caregiver for follow up care as recommended. A program for neck and back injury rehabilitation with stretching and strengthening exercises is an important part of management.  SEEK IMMEDIATE MEDICAL CARE IF:  You develop increased pain, weakness, or numbness in your arm or leg.  You develop difficulty with bladder or bowel control.  You develop abdominal pain.   This information is not intended to replace advice given to you by your health care provider. Make sure you discuss any questions you have with your health care provider.   Document Released: 09/17/2004 Document Revised: 08/31/2014 Document Reviewed: 03/06/2015 Elsevier Interactive Patient Education 2016 [employer]. There is no sign of a new fracture please wear the splint provided at Urgent Care  Discuss your arm pain with the neurologist that you will be seeing for the radiculopathy of your legs next week  If you wants copies of your records/xray you can go to Medical Records ans request copies

## 2015-10-07 ENCOUNTER — Encounter: Payer: Self-pay | Admitting: Diagnostic Neuroimaging

## 2015-10-07 ENCOUNTER — Ambulatory Visit (INDEPENDENT_AMBULATORY_CARE_PROVIDER_SITE_OTHER): Payer: Self-pay | Admitting: Diagnostic Neuroimaging

## 2015-10-07 ENCOUNTER — Ambulatory Visit: Payer: Self-pay | Admitting: Diagnostic Neuroimaging

## 2015-10-07 VITALS — BP 147/95 | HR 84 | Ht 68.5 in | Wt 190.4 lb

## 2015-10-07 DIAGNOSIS — G609 Hereditary and idiopathic neuropathy, unspecified: Secondary | ICD-10-CM

## 2015-10-07 NOTE — Patient Instructions (Signed)

## 2015-10-07 NOTE — Progress Notes (Signed)
[facility]  PATIENT: Amanda [name] DOB: 1966/05/06  REFERRING CLINICIAN: Berline Chough HISTORY FROM: patient  REASON FOR VISIT: new consult / existing patient    HISTORICAL  CHIEF COMPLAINT:  Chief Complaint  Patient presents with  . Evaulation of peripheral neuropathy    rm 6, New Patient, "feet get frozen, locked, tingling, hands/feet feel ice cold"     HISTORY OF PRESENT ILLNESS:   UPDATE 10/07/15: Patient presents for follow up. Still with pain, numbness, tingling in arms, hands, legs, feet. Has been to ortho, pain mgmt, PCP and PT. More pain, balance issues.  PRIOR HPI (11/18/12): 50 year old right-handed female with history of depression, anxiety, bilateral carpal tunnel syndrome, here for evaluation of numbness and pain in her hands and feet. In 2003 patient was diagnosed with bilateral carpal tunnel syndrome, underwent wrist splint therapy, steroid injections, and ultimately right side carpal tunnel release surgery. Patient had some improvement of symptoms initially. Over time her symptoms have returned. Patient having increasing numbness, tingling in her hands. She is dropping objects. She wakes up in the middle the night and shakes her hands. Even during our conversation patient repeatedly rubs her hands and shakes her hands. Over the past 6 months patient has developed numbness and tingling in her toes on both sides. She has remote history of bilateral hammertoe fusion surgery. No family history of hammertoes. Patient doesn't remember what time in her life she developed these hammertoes. Patient is markedly anxious during our conversation. She interrupts me frequently and apologizes at the same time.  She has pressured speech and tangential thought process.  REVIEW OF SYSTEMS: Full 14 system review of systems performed and notable only for MURMUR COUGH NUMBNESS WEAKNESS NOT ENOUGH SLEEP.  ALLERGIES: Allergies  Allergen Reactions  . Penicillins Anaphylaxis   Has patient had a PCN reaction causing immediate rash, facial/tongue/throat swelling, SOB or lightheadedness with hypotension: Yes Has patient had a PCN reaction causing severe rash involving mucus membranes or skin necrosis: Yes Has patient had a PCN reaction that required hospitalization Yes Has patient had a PCN reaction occurring within the last 10 years: No-more than 10 years ago If all of the above answers are "NO", then may proceed with Cephalosporin use.     HOME MEDICATIONS: Outpatient Prescriptions Prior to Visit  Medication Sig Dispense Refill  . albuterol (PROVENTIL HFA;VENTOLIN HFA) 108 (90 BASE) MCG/ACT inhaler Inhale 1-2 puffs into the lungs every 6 (six) hours as needed for wheezing or shortness of breath. 1 Inhaler 3  . atenolol (TENORMIN) 50 MG tablet Take 1 tablet (50 mg total) by mouth 2 (two) times daily. 60 tablet 2  . cetirizine (ZYRTEC) 10 MG tablet Take 1 tablet (10 mg total) by mouth daily. 30 tablet 2  . cloNIDine (CATAPRES) 0.1 MG tablet Take 1 tablet (0.1 mg total) by mouth at bedtime. 60 tablet 2  . cyclobenzaprine (FLEXERIL) 10 MG tablet Take 1 tablet (10 mg total) by mouth 2 (two) times daily. 60 tablet 2  . gabapentin (NEURONTIN) 300 MG capsule Take 1 capsule (300 mg total) by mouth 3 (three) times daily. 90 capsule 2  . hydrOXYzine (ATARAX/VISTARIL) 50 MG tablet Take 1 tablet (50 mg total) by mouth 3 (three) times daily as needed for anxiety or itching. 90 tablet 0  . lisinopril (PRINIVIL,ZESTRIL) 20 MG tablet Take 1 tablet (20 mg total) by mouth daily. 30 tablet 2  . mirtazapine (REMERON) 30 MG tablet Take 45 mg by mouth at bedtime.    Marland Kitchen  naltrexone (DEPADE) 50 MG tablet Take 0.5 tablets (25 mg total) by mouth daily. 30 tablet 0  . omeprazole (PRILOSEC) 20 MG capsule Take 1 capsule (20 mg total) by mouth daily. 30 capsule 2  . topiramate (TOPAMAX) 50 MG tablet Take 1 tablet (50 mg total) by mouth 2 (two) times daily. Patient 60 tablet 2  . traZODone (DESYREL)  300 MG tablet Take 1 tablet (300 mg total) by mouth at bedtime. 30 tablet 0   No facility-administered medications prior to visit.    PAST MEDICAL HISTORY: Past Medical History  Diagnosis Date  . Depression   . Anxiety   . Carpal tunnel syndrome, bilateral   . GERD (gastroesophageal reflux disease)   . Neuropathy (HCC)   . Hypertension   . Asthma   . PUD (peptic ulcer disease)   . Bipolar disorder (HCC)   . Fx. left wrist   . Arthritis     lower back  . Muscle spasms of neck     PAST SURGICAL HISTORY: Past Surgical History  Procedure Laterality Date  . Rotator cuff repair Right 2011  . Hammer toe fusion Bilateral 2008  . Carpal tunnel release Right 2003  . Cholecystectomy N/A 02/09/2014    Procedure: LAPAROSCOPIC CHOLECYSTECTOMY WITH INTRAOPERATIVE CHOLANGIOGRAM;  Surgeon: Valarie Merino, MD;  Location: [facility];  Service: General;  Laterality: N/A;    FAMILY HISTORY: Family History  Problem Relation Age of Onset  . Cancer Mother     pancreatic  . Heart failure Father   . Diabetes Father   . Cancer Paternal Grandmother   . HIV/AIDS Brother     SOCIAL HISTORY:  Social History   Social History  . Marital Status: Single    Spouse Name: N/A  . Number of Children: 0  . Years of Education: 12   Occupational History  . [name]   Social History Main Topics  . Smoking status: Never Smoker   . Smokeless tobacco: Never Used  . Alcohol Use: No  . Drug Use: No     Comment: 07/24/15 patient states she has not used in 3 weeks  . Sexual Activity: No   Other Topics Concern  . Not on file   Social History Narrative   Pt lives at home alone. She has a HS education level and does not have any children.    She drinks 2 cups of caffeine daily.     PHYSICAL EXAM  GENERAL EXAM/CONSTITUTIONAL: Vitals:  Filed Vitals:   10/07/15 1313  BP: 147/95  Pulse: 84  Height: 5' 8.5" (1.74 m)  Weight: 190 lb 6.4 oz (86.365 kg)     Body mass index is 28.53  kg/(m^2).  Visual Acuity Screening   Right eye Left eye Both eyes  Without correction: 20/70 20/70   With correction:        Patient is in no distress; well developed, nourished and groomed; neck is supple  CARDIOVASCULAR:  Examination of carotid arteries is normal; no carotid bruits  Regular rate and rhythm, no murmurs  Examination of peripheral vascular system by observation and palpation is normal  EYES:  Ophthalmoscopic exam of optic discs and posterior segments is normal; no papilledema or hemorrhages  MUSCULOSKELETAL:  Gait, strength, tone, movements noted in Neurologic exam below  NEUROLOGIC: MENTAL STATUS:  No flowsheet data found.  awake, alert, oriented to person, place and time  recent and remote memory intact  normal attention and concentration  language fluent, comprehension  intact, naming intact,   fund of knowledge appropriate  PRESSURED SPEECH  CRANIAL NERVE:   2nd - no papilledema on fundoscopic exam  2nd, 3rd, 4th, 6th - pupils equal and reactive to light, visual fields full to confrontation, extraocular muscles intact, no nystagmus  5th - facial sensation symmetric  7th - facial strength symmetric  8th - hearing intact  9th - palate elevates symmetrically, uvula midline  11th - shoulder shrug symmetric  12th - tongue protrusion midline  MOTOR:   normal bulk and tone, full strength in the BUE, BLE; EXCEPT LEFT WRIST/HAND AND BILATERAL HIP FLEXION LIMITED BY PAIN  SENSORY:   normal and symmetric to light touch, temperature, vibration; EXCEPT DECR PP, TEMP, VIB IN RIGHT FOOT  COORDINATION:   finger-nose-finger, fine finger movements normal  REFLEXES:   deep tendon reflexes present and symmetric; TRACE AT ANKLES  GAIT/STATION:   narrow based gait; romberg is negative    DIAGNOSTIC DATA (LABS, IMAGING, TESTING) - I reviewed patient records, labs, notes, testing and imaging myself where available.  Lab Results    Component Value Date   WBC 9.7 06/26/2015   HGB 14.0 06/26/2015   HCT 41.1 06/26/2015   MCV 97.9 06/26/2015   PLT 294 06/26/2015      Component Value Date/Time   NA 140 06/26/2015 1633   K 3.4* 06/26/2015 1633   CL 102 06/26/2015 1633   CO2 25 06/26/2015 1633   GLUCOSE 110* 06/26/2015 1633   BUN 6 06/26/2015 1633   CREATININE 0.80 06/26/2015 1633   CALCIUM 9.7 06/26/2015 1633   PROT 6.9 06/26/2015 1633   ALBUMIN 3.8 06/26/2015 1633   AST 25 06/26/2015 1633   ALT 20 06/26/2015 1633   ALKPHOS 76 06/26/2015 1633   BILITOT 0.5 06/26/2015 1633   GFRNONAA >60 06/26/2015 1633   GFRAA >60 06/26/2015 1633   No results found for: CHOL, HDL, LDLCALC, LDLDIRECT, TRIG, CHOLHDL Lab Results  Component Value Date   HGBA1C 4.90 07/24/2015   Lab Results  Component Value Date   VITAMINB12 509 11/18/2012   Lab Results  Component Value Date   TSH 0.896 06/26/2015    03/21/13 EMG/NCS 1. Left median neuropathy at the wrist consistent with left carpal tunnel syndrome. Borderline right carpal tunnel syndrome also noted. 2. Decreased tibial motor nerve amplitudes may be related to motor neuropathy, lumbar radiculopathy or technical factors. No other supportive evidence is present. Clinical context suggests lumbar etiology.  12/05/14 MRI lumbar spine - Negative exam.  12/05/14 MRI brain  1. No acute intracranial abnormality or mass. 2. Multiple small foci of cerebral white matter T2 signal abnormality, advanced for age and nonspecific. Considerations include mild chronic small vessel ischemia, sequelae of trauma, hypercoagulable state, vasculitis, migraines, prior infection or demyelination.     ASSESSMENT AND PLAN  50 y.o. year old female here with here with numbness and pain in hands and feet. Hand symptoms may be related to recurrent carpal tunnel syndrome problems. The symptoms in the feet may represent peripheral neuropathy versus lumbar radiculopathy. MRI lumbar spine unremarkable.  Neuropathy lab testing unremarkable. EMG showed carpal tunnel and mild motor neuropathy vs lumbar radiculopathy in 2014. Therefore, patient most likely has a hereditary or idiopathic neuropathy.    Dx:  1. Hereditary and idiopathic peripheral neuropathy      PLAN: - Continue gabapentin, pain mgmt and physical therapy - follow up with PCP  Return if symptoms worsen or fail to improve, for return to PCP.    VIKRAM  R. PENUMALLI, MD 10/07/2015, 1:52 PM Certified in Neurology, Neurophysiology and Neuroimaging  [facility] [address], [address] [place], [place] [address] [phone]

## 2015-10-14 ENCOUNTER — Telehealth: Payer: Self-pay | Admitting: *Deleted

## 2015-10-14 MED FILL — VENTOLIN HFA 90 MCG INHALER: 108 (90 BAS | 25 days supply | Qty: 18 | Fill #2

## 2015-10-14 MED FILL — CYCLOBENZAPRINE 10 MG TAB: 10 | 30 days supply | Qty: 60 | Fill #1

## 2015-10-14 MED FILL — cloNIDine HCL 0.1 MG TABS: 0.1 | 30 days supply | Qty: 30 | Fill #2

## 2015-10-14 MED FILL — LISINOPRIL 20 MG TABLET: 20 | 30 days supply | Qty: 30 | Fill #2

## 2015-10-14 MED FILL — ATENOLOL 50 MG TABLET: 50 | 30 days supply | Qty: 60 | Fill #2

## 2015-10-14 MED FILL — TOPIRAMATE 50 MG TABLET: 50 | 30 days supply | Qty: 60 | Fill #2

## 2015-10-14 MED FILL — ALL DAY ALLERGY 10 MG TAB: 10 | 30 days supply | Qty: 30 | Fill #2

## 2015-10-14 MED FILL — OMEPRAZOLE DR 20 MG CAPSULE: 20 | 30 days supply | Qty: 30 | Fill #2

## 2015-10-14 MED FILL — GABAPENTIN 600 MG TABLET: 600 | 30 days supply | Qty: 90 | Fill #2

## 2015-10-14 NOTE — Telephone Encounter (Signed)
LVM with this caller's name, number; requested call back for clarification on letter patient requested.

## 2015-10-15 NOTE — Telephone Encounter (Signed)
Patient returned [facility] clare's call, states she is in a hotel, will have to call back.

## 2015-10-15 NOTE — Telephone Encounter (Signed)
LVM explaining that Dr Marjory Lies stated she must be seizure free x 6 months before she drives again. Her last reported seizure was Oct 2016, therefore she should not drive before April 2017 per [facility] DMV law. Informed her he will not provide letter she requested. Also reminded her to be diligent in taking her anti-seizure medication as prescribed. Left this caller's name, number .

## 2015-10-16 NOTE — Telephone Encounter (Signed)
LVM requesting call back.

## 2015-10-17 NOTE — Telephone Encounter (Signed)
Received call from patient who wants to pick up copy of OV note from her appointment with Dr Marjory Lies. Advised she come in before 5 pm today or 12 noon tomorrow and sign a release to get copy. She verbalized understanding, appreciation. Addendum: phone note on 10/15/15 from this RN was charted on wrong patient. It was not meant for this patient.

## 2015-10-23 ENCOUNTER — Encounter: Payer: Self-pay | Admitting: Family Medicine

## 2015-10-23 ENCOUNTER — Ambulatory Visit: Payer: Self-pay

## 2015-10-23 ENCOUNTER — Ambulatory Visit: Payer: No Typology Code available for payment source | Attending: Family Medicine | Admitting: Family Medicine

## 2015-10-23 VITALS — BP 168/108 | HR 82 | Temp 97.4°F | Resp 15 | Ht 68.5 in | Wt 188.2 lb

## 2015-10-23 DIAGNOSIS — Z9889 Other specified postprocedural states: Secondary | ICD-10-CM | POA: Insufficient documentation

## 2015-10-23 DIAGNOSIS — M5440 Lumbago with sciatica, unspecified side: Secondary | ICD-10-CM

## 2015-10-23 DIAGNOSIS — Z9049 Acquired absence of other specified parts of digestive tract: Secondary | ICD-10-CM | POA: Insufficient documentation

## 2015-10-23 DIAGNOSIS — G5603 Carpal tunnel syndrome, bilateral upper limbs: Secondary | ICD-10-CM | POA: Insufficient documentation

## 2015-10-23 DIAGNOSIS — G8929 Other chronic pain: Secondary | ICD-10-CM

## 2015-10-23 DIAGNOSIS — Z880 Allergy status to penicillin: Secondary | ICD-10-CM | POA: Insufficient documentation

## 2015-10-23 DIAGNOSIS — I10 Essential (primary) hypertension: Secondary | ICD-10-CM | POA: Insufficient documentation

## 2015-10-23 DIAGNOSIS — F3162 Bipolar disorder, current episode mixed, moderate: Secondary | ICD-10-CM

## 2015-10-23 DIAGNOSIS — M25532 Pain in left wrist: Secondary | ICD-10-CM | POA: Insufficient documentation

## 2015-10-23 DIAGNOSIS — J45909 Unspecified asthma, uncomplicated: Secondary | ICD-10-CM | POA: Insufficient documentation

## 2015-10-23 DIAGNOSIS — G5600 Carpal tunnel syndrome, unspecified upper limb: Secondary | ICD-10-CM

## 2015-10-23 DIAGNOSIS — K219 Gastro-esophageal reflux disease without esophagitis: Secondary | ICD-10-CM | POA: Insufficient documentation

## 2015-10-23 DIAGNOSIS — F319 Bipolar disorder, unspecified: Secondary | ICD-10-CM | POA: Insufficient documentation

## 2015-10-23 DIAGNOSIS — J4520 Mild intermittent asthma, uncomplicated: Secondary | ICD-10-CM

## 2015-10-23 DIAGNOSIS — G629 Polyneuropathy, unspecified: Secondary | ICD-10-CM

## 2015-10-23 DIAGNOSIS — M1990 Unspecified osteoarthritis, unspecified site: Secondary | ICD-10-CM | POA: Insufficient documentation

## 2015-10-23 MED ORDER — LISINOPRIL-HYDROCHLOROTHIAZIDE 20-25 MG PO TABS: 1.0000 | ORAL_TABLET | Freq: Every day | ORAL | Status: DC

## 2015-10-23 MED ORDER — OMEPRAZOLE 40 MG PO CPDR: 40.0000 mg | DELAYED_RELEASE_CAPSULE | Freq: Every day | ORAL | Status: DC

## 2015-10-23 MED ORDER — GABAPENTIN 300 MG PO CAPS: 600.0000 mg | ORAL_CAPSULE | Freq: Three times a day (TID) | ORAL | Status: DC

## 2015-10-23 MED ORDER — HYDROXYZINE HCL 50 MG PO TABS: 50.0000 mg | ORAL_TABLET | Freq: Three times a day (TID) | ORAL | Status: DC | PRN

## 2015-10-23 MED ORDER — TRAMADOL HCL 50 MG PO TABS: 50.0000 mg | ORAL_TABLET | Freq: Two times a day (BID) | ORAL | Status: DC | PRN

## 2015-10-23 MED FILL — LISINOPRIL-HCTZ 20-25 MG TA: 20-25 | 30 days supply | Qty: 30 | Fill #0

## 2015-10-23 MED FILL — OMEPRAZOLE DR 40 MG CAPSULE: 40 | 30 days supply | Qty: 30 | Fill #0

## 2015-10-23 MED FILL — traMADol HCL 50 MG TABS: 50 | 30 days supply | Qty: 60 | Fill #0

## 2015-10-23 MED FILL — GABAPENTIN 300 MG CAPSULE: 300 | 20 days supply | Qty: 120 | Fill #0

## 2015-10-23 NOTE — Patient Instructions (Signed)
Hypertension Hypertension, commonly called high blood pressure, is when the force of blood pumping through your arteries is too strong. Your arteries are the blood vessels that carry blood from your heart throughout your body. A blood pressure reading consists of a higher number over a lower number, such as 110/72. The higher number (systolic) is the pressure inside your arteries when your heart pumps. The lower number (diastolic) is the pressure inside your arteries when your heart relaxes. Ideally you want your blood pressure below 120/80. Hypertension forces your heart to work harder to pump blood. Your arteries may become narrow or stiff. Having untreated or uncontrolled hypertension can cause heart attack, stroke, kidney disease, and other problems. RISK FACTORS Some risk factors for high blood pressure are controllable. Others are not.  Risk factors you cannot control include:   Race. You may be at higher risk if you are African American.  Age. Risk increases with age.  Gender. Men are at higher risk than women before age 45 years. After age 65, women are at higher risk than men. Risk factors you can control include:  Not getting enough exercise or physical activity.  Being overweight.  Getting too much fat, sugar, calories, or salt in your diet.  Drinking too much alcohol. SIGNS AND SYMPTOMS Hypertension does not usually cause signs or symptoms. Extremely high blood pressure (hypertensive crisis) may cause headache, anxiety, shortness of breath, and nosebleed. DIAGNOSIS To check if you have hypertension, your health care provider will measure your blood pressure while you are seated, with your arm held at the level of your heart. It should be measured at least twice using the same arm. Certain conditions can cause a difference in blood pressure between your right and left arms. A blood pressure reading that is higher than normal on one occasion does not mean that you need treatment. If  it is not clear whether you have high blood pressure, you may be asked to return on a different day to have your blood pressure checked again. Or, you may be asked to monitor your blood pressure at home for 1 or more weeks. TREATMENT Treating high blood pressure includes making lifestyle changes and possibly taking medicine. Living a healthy lifestyle can help lower high blood pressure. You may need to change some of your habits. Lifestyle changes may include:  Following the DASH diet. This diet is high in fruits, vegetables, and whole grains. It is low in salt, red meat, and added sugars.  Keep your sodium intake below 2,300 mg per day.  Getting at least 30-45 minutes of aerobic exercise at least 4 times per week.  Losing weight if necessary.  Not smoking.  Limiting alcoholic beverages.  Learning ways to reduce stress. Your health care provider may prescribe medicine if lifestyle changes are not enough to get your blood pressure under control, and if one of the following is true:  You are 18-59 years of age and your systolic blood pressure is above 140.  You are 60 years of age or older, and your systolic blood pressure is above 150.  Your diastolic blood pressure is above 90.  You have diabetes, and your systolic blood pressure is over 140 or your diastolic blood pressure is over 90.  You have kidney disease and your blood pressure is above 140/90.  You have heart disease and your blood pressure is above 140/90. Your personal target blood pressure may vary depending on your medical conditions, your age, and other factors. HOME CARE INSTRUCTIONS    Have your blood pressure rechecked as directed by your health care provider.   Take medicines only as directed by your health care provider. Follow the directions carefully. Blood pressure medicines must be taken as prescribed. The medicine does not work as well when you skip doses. Skipping doses also puts you at risk for  problems.  Do not smoke.   Monitor your blood pressure at home as directed by your health care provider. SEEK MEDICAL CARE IF:   You think you are having a reaction to medicines taken.  You have recurrent headaches or feel dizzy.  You have swelling in your ankles.  You have trouble with your vision. SEEK IMMEDIATE MEDICAL CARE IF:  You develop a severe headache or confusion.  You have unusual weakness, numbness, or feel faint.  You have severe chest or abdominal pain.  You vomit repeatedly.  You have trouble breathing. MAKE SURE YOU:   Understand these instructions.  Will watch your condition.  Will get help right away if you are not doing well or get worse.   This information is not intended to replace advice given to you by your health care provider. Make sure you discuss any questions you have with your health care provider.   Document Released: 08/10/2005 Document Revised: 12/25/2014 Document Reviewed: 06/02/2013 Elsevier Interactive Patient Education 2016 Elsevier Inc.  

## 2015-10-23 NOTE — Progress Notes (Signed)
Subjective:    Patient ID: Amanda [name], female    DOB: 1966-07-29, 50 y.o.   MRN: [id number]  HPI 50 year old female with a history of hypertension, bipolar disorder, neuropathy, chronic low back pain who comes in today complaining of chronic left wrist pain. She states she broke her left wrist in 11/2014 and had surgery but in the last 1 month she has noticed increasing pain on the radial aspect of her left wrist. She does have a history of carpal tunnel syndrome and is status post release surgery several years ago.  She takes Prilosec for GERD and complains that the 20 mg she takes is not working.  She continues to complain of low back pain which radiated down her right leg and pain in her legs and worsening neuropathy. She is currently on gabapentin 300 mg 3 times daily which is not helping and at her last visit to [facility] neuropathy was thought to be hereditary versus idiopathic and she was to continue gabapentin, physical therapy and pain management as an neuropathy lab testing was unremarkable, MRI lumbar spine was unremarkable. EMG showed carpal tunnel and mild motor neuropathy versus lumbar radiculopathy. She complains pain is so bad that sometimes she is barely able to get out of bed in the morning.  Past Medical History  Diagnosis Date  . Depression   . Anxiety   . Carpal tunnel syndrome, bilateral   . GERD (gastroesophageal reflux disease)   . Neuropathy (HCC)   . Hypertension   . Asthma   . PUD (peptic ulcer disease)   . Bipolar disorder (HCC)   . Fx. left wrist   . Arthritis     lower back  . Muscle spasms of neck     Past Surgical History  Procedure Laterality Date  . Rotator cuff repair Right 2011  . Hammer toe fusion Bilateral 2008  . Carpal tunnel release Right 2003  . Cholecystectomy N/A 02/09/2014    Procedure: LAPAROSCOPIC CHOLECYSTECTOMY WITH INTRAOPERATIVE CHOLANGIOGRAM;  Surgeon: Valarie Merino, MD;  Location: [facility];  Service:  General;  Laterality: N/A;    Social History   Social History  . Marital Status: Single    Spouse Name: N/A  . Number of Children: 0  . Years of Education: 12   Occupational History  . [name]   Social History Main Topics  . Smoking status: Never Smoker   . Smokeless tobacco: Never Used  . Alcohol Use: No  . Drug Use: No     Comment: 07/24/15 patient states she has not used in 3 weeks  . Sexual Activity: No   Other Topics Concern  . Not on file   Social History Narrative   Pt lives at home alone. She has a HS education level and does not have any children.    She drinks 2 cups of caffeine daily.    Allergies  Allergen Reactions  . Penicillins Anaphylaxis    Has patient had a PCN reaction causing immediate rash, facial/tongue/throat swelling, SOB or lightheadedness with hypotension: Yes Has patient had a PCN reaction causing severe rash involving mucus membranes or skin necrosis: Yes Has patient had a PCN reaction that required hospitalization Yes Has patient had a PCN reaction occurring within the last 10 years: No-more than 10 years ago If all of the above answers are "NO", then may proceed with Cephalosporin use.     Review of Systems  Constitutional: Negative for activity change, appetite  change and fatigue.  HENT: Negative for congestion, sinus pressure and sore throat.   Eyes: Negative for visual disturbance.  Respiratory: Negative for cough, chest tightness, shortness of breath and wheezing.   Cardiovascular: Negative for chest pain and palpitations.  Gastrointestinal: Negative for abdominal pain, constipation and abdominal distention.       Positive for reflux  Endocrine: Negative for polydipsia.  Genitourinary: Negative.  Negative for dysuria and frequency.  Musculoskeletal: Negative for back pain and arthralgias.       See history of present illness  Skin: Negative for rash.  Neurological: Negative for tremors, light-headedness and numbness.    Hematological: Does not bruise/bleed easily.  Psychiatric/Behavioral: Negative for behavioral problems, dysphoric mood and agitation.       Objective: Filed Vitals:   10/23/15 1405 10/23/15 1408  BP: 186/108 168/108  Pulse: 82   Temp: 97.4 F (36.3 C)   Resp: 15   Height: 5' 8.5" (1.74 m)   Weight: 188 lb 3.2 oz (85.367 kg)   SpO2: 100%       Physical Exam  Constitutional: She is oriented to person, place, and time. She appears well-developed and well-nourished.  Cardiovascular: Normal rate, normal heart sounds and intact distal pulses.   No murmur heard. Pulmonary/Chest: Effort normal and breath sounds normal. She has no wheezes. She has no rales. She exhibits no tenderness.  Abdominal: Soft. Bowel sounds are normal. She exhibits no distension and no mass. There is no tenderness.  Musculoskeletal: She exhibits tenderness (tenderness on palpation of radial head on the left wrist).  Neurological: She is alert and oriented to person, place, and time.          Assessment & Plan:  Hypertension -Uncontrolled -Switch from lisinopril to lisinopril/HCTZ -Advised on low-sodium diet -We'll reassess blood pressure at next visit.  Left wrist pain -Exam in keeping with inflammatory changes in radial head but the patient is resistant to NSAIDs because it doesn't work. -Placed on tramadol which will also help with back pain -Urine drug screen sent off -Cannot exclude underlying carpal tunnel syndrome.  Carpal tunnel syndrome -Status post bilateral release surgery in the past -If symptoms persist will refer to also hand.  Peripheral neuropathy -Idiopathic versus hereditary according to neurology -Increase gabapentin to 600 mg twice daily  Chronic low back pain -Patient says PT was ineffective -Referred to pain management and I have explained to the patient that her lack of medical coverage will be a limitation and she might be required to pay some amount up front at her  visit  GERD -Uncontrolled -Increase dose of omeprazole -Advised against late-night meals.  This note has been created with Education officer, environmental. Any transcriptional errors are unintentional.

## 2015-10-23 NOTE — Progress Notes (Signed)
Patient complains of severe left wrist pain and cold hands She states she has taken all meds today but is unsure what the naltraxone is for and is not sure if she is taking it She states her Prilosec is no longer working

## 2015-10-24 ENCOUNTER — Telehealth: Payer: Self-pay | Admitting: *Deleted

## 2015-10-24 LAB — DRUGS OF ABUSE SCREEN W/O ALC, ROUTINE URINE
Amphetamine Screen, Ur: NEGATIVE
Barbiturate Quant, Ur: NEGATIVE
Benzodiazepines.: NEGATIVE
Cocaine Metabolites: NEGATIVE
Creatinine,U: 26.9 mg/dL
Marijuana Metabolite: NEGATIVE
Methadone: NEGATIVE
Opiate Screen, Urine: NEGATIVE
Phencyclidine (PCP): NEGATIVE
Propoxyphene: NEGATIVE

## 2015-10-24 NOTE — Telephone Encounter (Signed)
Spoke to patient and gave results.  Name and date of birth verified

## 2015-10-24 NOTE — Telephone Encounter (Signed)
-----   Message from Jaclyn Shaggy, MD sent at 10/24/2015  8:31 AM EST ----- Please inform the patient that urine drug screen is normal. Thank you.

## 2015-10-31 ENCOUNTER — Telehealth: Payer: Self-pay | Admitting: *Deleted

## 2015-10-31 NOTE — Telephone Encounter (Signed)
Left message for patient to return my call.  Her cell number is not working at the moment.

## 2015-11-01 ENCOUNTER — Telehealth: Payer: Self-pay | Admitting: *Deleted

## 2015-11-01 NOTE — Telephone Encounter (Signed)
Attempted to contact patient again regarding low vitamin D levels.  Patient's numbers are not working.  Patient is homeless so letter cannot be mailed. MD aware.

## 2015-11-07 MED FILL — !VENTOLIN HFA INHALER: 108 (90 BAS | 25 days supply | Qty: 18 | Fill #3

## 2015-11-11 MED FILL — GABAPENTIN 300 MG CAPSULE: 300 | 20 days supply | Qty: 120 | Fill #1

## 2015-11-11 MED FILL — CYCLOBENZAPRINE 10 MG TAB: 10 | 30 days supply | Qty: 60 | Fill #2

## 2015-11-11 MED FILL — hydrOXYzine HCL 50 MG TABS: 50 | 30 days supply | Qty: 90 | Fill #0

## 2015-11-11 MED FILL — VIT D2 1.25 MG (50,000 UNIT: 1.25 MG | 84 days supply | Qty: 12 | Fill #0

## 2015-11-14 MED FILL — BACLOFEN 10 MG TABLET: 10 | 10 days supply | Qty: 30 | Fill #0

## 2015-11-15 ENCOUNTER — Other Ambulatory Visit (HOSPITAL_COMMUNITY): Payer: Self-pay | Admitting: Sports Medicine

## 2015-11-15 DIAGNOSIS — M25532 Pain in left wrist: Secondary | ICD-10-CM

## 2015-11-19 ENCOUNTER — Ambulatory Visit: Payer: No Typology Code available for payment source | Attending: Family Medicine | Admitting: Family Medicine

## 2015-11-19 ENCOUNTER — Other Ambulatory Visit: Payer: Self-pay | Admitting: Family Medicine

## 2015-11-19 ENCOUNTER — Encounter: Payer: Self-pay | Admitting: Family Medicine

## 2015-11-19 VITALS — BP 147/95 | HR 80 | Temp 98.1°F | Resp 15 | Ht 68.5 in | Wt 198.0 lb

## 2015-11-19 DIAGNOSIS — F319 Bipolar disorder, unspecified: Secondary | ICD-10-CM | POA: Insufficient documentation

## 2015-11-19 DIAGNOSIS — G5600 Carpal tunnel syndrome, unspecified upper limb: Secondary | ICD-10-CM | POA: Insufficient documentation

## 2015-11-19 DIAGNOSIS — J302 Other seasonal allergic rhinitis: Secondary | ICD-10-CM

## 2015-11-19 DIAGNOSIS — I10 Essential (primary) hypertension: Secondary | ICD-10-CM | POA: Insufficient documentation

## 2015-11-19 DIAGNOSIS — Z79899 Other long term (current) drug therapy: Secondary | ICD-10-CM | POA: Insufficient documentation

## 2015-11-19 DIAGNOSIS — K219 Gastro-esophageal reflux disease without esophagitis: Secondary | ICD-10-CM | POA: Insufficient documentation

## 2015-11-19 DIAGNOSIS — K047 Periapical abscess without sinus: Secondary | ICD-10-CM | POA: Insufficient documentation

## 2015-11-19 DIAGNOSIS — G629 Polyneuropathy, unspecified: Secondary | ICD-10-CM | POA: Insufficient documentation

## 2015-11-19 DIAGNOSIS — G8929 Other chronic pain: Secondary | ICD-10-CM | POA: Insufficient documentation

## 2015-11-19 MED ORDER — OLOPATADINE HCL 0.1 % OP SOLN: 1.0000 [drp] | Freq: Two times a day (BID) | OPHTHALMIC | Status: DC

## 2015-11-19 MED ORDER — CETIRIZINE HCL 10 MG PO TABS: 10.0000 mg | ORAL_TABLET | Freq: Every day | ORAL | Status: DC

## 2015-11-19 MED ORDER — CLINDAMYCIN HCL 300 MG PO CAPS: 300.0000 mg | ORAL_CAPSULE | Freq: Two times a day (BID) | ORAL | Status: DC

## 2015-11-19 MED FILL — ?CETIRIZINE HCL 10 MG TABLE: 10 | 30 days supply | Qty: 30 | Fill #0

## 2015-11-19 MED FILL — CLINDAMYCIN HCL 300 MG CAP: 300 | 10 days supply | Qty: 20 | Fill #0

## 2015-11-19 MED FILL — OLOPATADINE HCL 0.1% EYE DR: 0.1 | 30 days supply | Qty: 5 | Fill #0

## 2015-11-19 NOTE — Progress Notes (Signed)
Subjective:  Patient ID: Amanda [name], female    DOB: 1966/03/30  Age: 50 y.o. MRN: [id number]  CC: mouth abscess   HPI Amanda FillersDonna A Deason with a history of hypertension, bipolar disorder, neuropathy, chronic low back pain, GERD, carpal tunnel syndrome who comes in today for follow-up on hypertension. At her last office visit lisinopril was switched to lisinopril/HCTZ with resulting improvement in her blood pressure.  She is currently seeing a hand specialist-Dr.Rigby for management of her carpal tunnel syndrome of the left hand and is scheduled to have an MRI. She is requesting a follow-up on her pain management referral for chronic neuropathy and low back pain for which she had been receiving tramadol which she informs me she threw her into the toilet because it was no longer effective. She tells me she received a letter stating she is "100% [facility] for all her services" [facility]  Complains of tearing from both eyes, right temporal headache which she attributes to allergies. Also has left tooth abscess, denies fever. Has not seen a dentist in a while.  She is also requesting a letter stating she can have her dog as a companion in her new accommodation.  Outpatient Prescriptions Prior to Visit  Medication Sig Dispense Refill  . albuterol (PROVENTIL HFA;VENTOLIN HFA) 108 (90 BASE) MCG/ACT inhaler Inhale 1-2 puffs into the lungs every 6 (six) hours as needed for wheezing or shortness of breath. 1 Inhaler 3  . atenolol (TENORMIN) 50 MG tablet Take 1 tablet (50 mg total) by mouth 2 (two) times daily. 60 tablet 2  . cetirizine (ZYRTEC) 10 MG tablet Take 1 tablet (10 mg total) by mouth daily. 30 tablet 2  . cloNIDine (CATAPRES) 0.1 MG tablet Take 1 tablet (0.1 mg total) by mouth at bedtime. 60 tablet 2  . cyclobenzaprine (FLEXERIL) 10 MG tablet Take 1 tablet (10 mg total) by mouth 2 (two) times daily. 60 tablet 2  . gabapentin (NEURONTIN) 300 MG capsule Take 2 capsules (600 mg total)  by mouth 3 (three) times daily. 120 capsule 2  . hydrOXYzine (ATARAX/VISTARIL) 50 MG tablet Take 1 tablet (50 mg total) by mouth 3 (three) times daily as needed for anxiety or itching. 90 tablet 2  . lisinopril-hydrochlorothiazide (PRINZIDE,ZESTORETIC) 20-25 MG tablet Take 1 tablet by mouth daily. 30 tablet 2  . mirtazapine (REMERON) 30 MG tablet Take 45 mg by mouth at bedtime.    . naltrexone (DEPADE) 50 MG tablet Take 0.5 tablets (25 mg total) by mouth daily. 30 tablet 0  . omeprazole (PRILOSEC) 40 MG capsule Take 1 capsule (40 mg total) by mouth daily. 30 capsule 2  . topiramate (TOPAMAX) 50 MG tablet Take 1 tablet (50 mg total) by mouth 2 (two) times daily. Patient 60 tablet 2  . traMADol (ULTRAM) 50 MG tablet Take 1 tablet (50 mg total) by mouth every 12 (twelve) hours as needed. 60 tablet 1  . traZODone (DESYREL) 300 MG tablet Take 1 tablet (300 mg total) by mouth at bedtime. 30 tablet 0   No facility-administered medications prior to visit.    ROS Review of Systems  Constitutional: Negative for activity change, appetite change and fatigue.  HENT: Positive for dental problem. Negative for congestion, sinus pressure and sore throat.   Eyes: Positive for discharge. Negative for visual disturbance.  Respiratory: Negative for cough, chest tightness, shortness of breath and wheezing.   Cardiovascular: Negative for chest pain and palpitations.  Gastrointestinal: Negative for abdominal pain, constipation and abdominal distention.  Endocrine: Negative for polydipsia.  Genitourinary: Negative for dysuria and frequency.  Musculoskeletal: Positive for back pain. Negative for arthralgias.  Skin: Negative for rash.  Neurological: Positive for numbness. Negative for tremors and light-headedness.  Hematological: Does not bruise/bleed easily.  Psychiatric/Behavioral: Negative for behavioral problems and agitation.    Objective:  BP 147/95 mmHg  Pulse 80  Temp(Src) 98.1 F (36.7 C)  Resp 15   Ht 5' 8.5" (1.74 m)  Wt 198 lb (89.812 kg)  BMI 29.66 kg/m2  SpO2 100%  BP/Weight 11/19/2015 10/23/2015 10/07/2015  Systolic BP 147 168 147  Diastolic BP 95 108 95  Wt. (Lbs) 198 188.2 190.4  BMI 29.66 28.2 28.53      Physical Exam  Constitutional: She is oriented to person, place, and time. She appears well-developed and well-nourished.  HENT:  Left posterior lower jaw abscess  Cardiovascular: Normal rate, normal heart sounds and intact distal pulses.   No murmur heard. Pulmonary/Chest: Effort normal and breath sounds normal. She has no wheezes. She has no rales. She exhibits no tenderness.  Abdominal: Soft. Bowel sounds are normal. She exhibits no distension and no mass. There is no tenderness.  Musculoskeletal: Normal range of motion.  Neurological: She is alert and oriented to person, place, and time.     Assessment & Plan:   1. Carpal tunnel syndrome, unspecified laterality Currently wearing a left wrist brace. Advised to keep appointment with hand hand specialist  2. Essential hypertension Blood pressure has improved is not at goal of less than 140/90 Advised on DASH, low-sodium diet - COMPLETE METABOLIC PANEL WITH GFR; Future - Lipid panel; Future  3. Neuropathy (HCC) Remains on gabapentin Awaiting appointment with pain management We'll send a message to referral coordinator regarding patient's [place] discounts status surgery referral can be processed  4. Seasonal allergies Continue Zyrtec - olopatadine (PATANOL) 0.1 % ophthalmic solution; Place 1 drop into both eyes 2 (two) times daily.  Dispense: 5 mL; Refill: 2  5. Tooth abscess She is pen allergic - clindamycin (CLEOCIN) 300 MG capsule; Take 1 capsule (300 mg total) by mouth 2 (two) times daily.  Dispense: 20 capsule; Refill: 0 - Ambulatory referral to Dentistry   Meds ordered this encounter  Medications  . olopatadine (PATANOL) 0.1 % ophthalmic solution    Sig: Place 1 drop into both eyes 2 (two)  times daily.    Dispense:  5 mL    Refill:  2  . clindamycin (CLEOCIN) 300 MG capsule    Sig: Take 1 capsule (300 mg total) by mouth 2 (two) times daily.    Dispense:  20 capsule    Refill:  0    Follow-up: Return in about 3 months (around 02/19/2016) for follow up of hypertension.   Jaclyn Shaggy MD

## 2015-11-19 NOTE — Progress Notes (Signed)
3 days left lower abscess on gum Watery eyes, headaches

## 2015-11-21 ENCOUNTER — Encounter (HOSPITAL_COMMUNITY): Payer: Self-pay | Admitting: *Deleted

## 2015-11-21 ENCOUNTER — Emergency Department (HOSPITAL_COMMUNITY): Payer: Self-pay

## 2015-11-21 ENCOUNTER — Emergency Department (HOSPITAL_COMMUNITY)
Admission: EM | Admit: 2015-11-21 | Discharge: 2015-11-21 | Disposition: A | Discharge status: Home / Self Care | Payer: Self-pay | Attending: Emergency Medicine | Admitting: Emergency Medicine

## 2015-11-21 DIAGNOSIS — I10 Essential (primary) hypertension: Secondary | ICD-10-CM | POA: Insufficient documentation

## 2015-11-21 DIAGNOSIS — F419 Anxiety disorder, unspecified: Secondary | ICD-10-CM | POA: Insufficient documentation

## 2015-11-21 DIAGNOSIS — J45909 Unspecified asthma, uncomplicated: Secondary | ICD-10-CM | POA: Insufficient documentation

## 2015-11-21 DIAGNOSIS — F319 Bipolar disorder, unspecified: Secondary | ICD-10-CM | POA: Insufficient documentation

## 2015-11-21 DIAGNOSIS — R159 Full incontinence of feces: Secondary | ICD-10-CM | POA: Insufficient documentation

## 2015-11-21 DIAGNOSIS — G629 Polyneuropathy, unspecified: Secondary | ICD-10-CM | POA: Insufficient documentation

## 2015-11-21 DIAGNOSIS — Z8711 Personal history of peptic ulcer disease: Secondary | ICD-10-CM | POA: Insufficient documentation

## 2015-11-21 DIAGNOSIS — K219 Gastro-esophageal reflux disease without esophagitis: Secondary | ICD-10-CM | POA: Insufficient documentation

## 2015-11-21 DIAGNOSIS — M549 Dorsalgia, unspecified: Secondary | ICD-10-CM

## 2015-11-21 DIAGNOSIS — M545 Low back pain: Secondary | ICD-10-CM | POA: Insufficient documentation

## 2015-11-21 DIAGNOSIS — Z3202 Encounter for pregnancy test, result negative: Secondary | ICD-10-CM | POA: Insufficient documentation

## 2015-11-21 DIAGNOSIS — Z79899 Other long term (current) drug therapy: Secondary | ICD-10-CM | POA: Insufficient documentation

## 2015-11-21 LAB — POC URINE PREG, ED: Preg Test, Ur: NEGATIVE

## 2015-11-21 LAB — CBC WITH DIFFERENTIAL/PLATELET
Basophils Absolute: 0 10*3/uL (ref 0.0–0.1)
Basophils Relative: 0 %
Eosinophils Absolute: 0.5 10*3/uL (ref 0.0–0.7)
Eosinophils Relative: 7 %
HCT: 32.3 % — ABNORMAL LOW (ref 36.0–46.0)
Hemoglobin: 10.5 g/dL — ABNORMAL LOW (ref 12.0–15.0)
Lymphocytes Relative: 45 %
Lymphs Abs: 3.1 10*3/uL (ref 0.7–4.0)
MCH: 31.1 pg (ref 26.0–34.0)
MCHC: 32.5 g/dL (ref 30.0–36.0)
MCV: 95.6 fL (ref 78.0–100.0)
Monocytes Absolute: 0.6 10*3/uL (ref 0.1–1.0)
Monocytes Relative: 8 %
Neutro Abs: 2.8 10*3/uL (ref 1.7–7.7)
Neutrophils Relative %: 40 %
Platelets: 298 10*3/uL (ref 150–400)
RBC: 3.38 MIL/uL — ABNORMAL LOW (ref 3.87–5.11)
RDW: 13 % (ref 11.5–15.5)
WBC: 7.1 10*3/uL (ref 4.0–10.5)

## 2015-11-21 LAB — BASIC METABOLIC PANEL
Anion gap: 7 (ref 5–15)
BUN: 10 mg/dL (ref 6–20)
CO2: 26 mmol/L (ref 22–32)
Calcium: 9.1 mg/dL (ref 8.9–10.3)
Chloride: 108 mmol/L (ref 101–111)
Creatinine, Ser: 0.86 mg/dL (ref 0.44–1.00)
GFR calc Af Amer: >60 mL/min (ref >60)
GFR calc non Af Amer: >60 mL/min (ref >60)
Glucose, Bld: 94 mg/dL (ref 65–99)
Potassium: 3.7 mmol/L (ref 3.5–5.1)
Sodium: 141 mmol/L (ref 135–145)

## 2015-11-21 LAB — URINALYSIS, ROUTINE W REFLEX MICROSCOPIC
Bilirubin Urine: NEGATIVE
Glucose, UA: NEGATIVE mg/dL
Hgb urine dipstick: NEGATIVE
Ketones, ur: 15 mg/dL — AB
Leukocytes, UA: NEGATIVE
Nitrite: NEGATIVE
Protein, ur: NEGATIVE mg/dL
Specific Gravity, Urine: 1.025 (ref 1.005–1.030)
pH: 7 (ref 5.0–8.0)

## 2015-11-21 MED ORDER — IBUPROFEN 800 MG PO TABS: 800.0000 mg | ORAL_TABLET | Freq: Three times a day (TID) | ORAL | Status: DC | PRN

## 2015-11-21 MED ORDER — ONDANSETRON HCL 4 MG/2ML IJ SOLN
4.0000 mg | Freq: Once | INTRAMUSCULAR | Status: AC
  Administered 2015-11-21: 4 mg via INTRAVENOUS
  Filled 2015-11-21: qty 2

## 2015-11-21 MED ORDER — OXYCODONE-ACETAMINOPHEN 5-325 MG PO TABS: 1.0000 | ORAL_TABLET | ORAL | Status: DC | PRN

## 2015-11-21 MED ORDER — MORPHINE SULFATE (PF) 4 MG/ML IV SOLN
4.0000 mg | Freq: Once | INTRAVENOUS | Status: AC
  Administered 2015-11-21: 4 mg via INTRAVENOUS
  Filled 2015-11-21: qty 1

## 2015-11-21 MED ORDER — SODIUM CHLORIDE 0.9 % IV SOLN
INTRAVENOUS | Status: DC
  Administered 2015-11-21: 06:00:00 via INTRAVENOUS

## 2015-11-21 MED ORDER — HYDROMORPHONE HCL 1 MG/ML IJ SOLN
1.0000 mg | Freq: Once | INTRAMUSCULAR | Status: AC
  Administered 2015-11-21: 1 mg via INTRAVENOUS
  Filled 2015-11-21: qty 1

## 2015-11-21 NOTE — ED Notes (Signed)
RN instructed patient to remove all metal for MRI

## 2015-11-21 NOTE — ED Notes (Signed)
Pt verbalized understanding of d/c instructions, prescriptions, and follow-up care. No further questions/concerns, VSS, ambulatory w/ steady gait (refused wheelchair) 

## 2015-11-21 NOTE — Discharge Instructions (Signed)
Back Pain, Adult °Back pain is very common in adults. The cause of back pain is rarely dangerous and the pain often gets better over time. The cause of your back pain may not be known. Some common causes of back pain include: °· Strain of the muscles or ligaments supporting the spine. °· Wear and tear (degeneration) of the spinal disks. °· Arthritis. °· Direct injury to the back. °For many people, back pain may return. Since back pain is rarely dangerous, most people can learn to manage this condition on their own. °HOME CARE INSTRUCTIONS °Watch your back pain for any changes. The following actions may help to lessen any discomfort you are feeling: °· Remain active. It is stressful on your back to sit or stand in one place for long periods of time. Do not sit, drive, or stand in one place for more than 30 minutes at a time. Take short walks on even surfaces as soon as you are able. Try to increase the length of time you walk each day. °· Exercise regularly as directed by your health care provider. Exercise helps your back heal faster. It also helps avoid future injury by keeping your muscles strong and flexible. °· Do not stay in bed. Resting more than 1-2 days can delay your recovery. °· Pay attention to your body when you bend and lift. The most comfortable positions are those that put less stress on your recovering back. Always use proper lifting techniques, including: °· Bending your knees. °· Keeping the load close to your body. °· Avoiding twisting. °· Find a comfortable position to sleep. Use a firm mattress and lie on your side with your knees slightly bent. If you lie on your back, put a pillow under your knees. °· Avoid feeling anxious or stressed. Stress increases muscle tension and can worsen back pain. It is important to recognize when you are anxious or stressed and learn ways to manage it, such as with exercise. °· Take medicines only as directed by your health care provider. Over-the-counter  medicines to reduce pain and inflammation are often the most helpful. Your health care provider may prescribe muscle relaxant drugs. These medicines help dull your pain so you can more quickly return to your normal activities and healthy exercise. °· Apply ice to the injured area: °· Put ice in a plastic bag. °· Place a towel between your skin and the bag. °· Leave the ice on for 20 minutes, 2-3 times a day for the first 2-3 days. After that, ice and heat may be alternated to reduce pain and spasms. °· Maintain a healthy weight. Excess weight puts extra stress on your back and makes it difficult to maintain good posture. °SEEK MEDICAL CARE IF: °· You have pain that is not relieved with rest or medicine. °· You have increasing pain going down into the legs or buttocks. °· You have pain that does not improve in one week. °· You have night pain. °· You lose weight. °· You have a fever or chills. °SEEK IMMEDIATE MEDICAL CARE IF:  °· You develop new bowel or bladder control problems. °· You have unusual weakness or numbness in your arms or legs. °· You develop nausea or vomiting. °· You develop abdominal pain. °· You feel faint. °  °This information is not intended to replace advice given to you by your health care provider. Make sure you discuss any questions you have with your health care provider. °  °Document Released: 08/10/2005 Document Revised: 08/31/2014 Document Reviewed: 12/12/2013 °Elsevier Interactive Patient Education ©2016 Elsevier   Inc.   Neuropathic Pain Neuropathic pain is pain caused by damage to the nerves that are responsible for certain sensations in your body (sensory nerves). The pain can be caused by damage to:   The sensory nerves that send signals to your spinal cord and brain (peripheral nervous system).  The sensory nerves in your brain or spinal cord (central nervous system). Neuropathic pain can make you more sensitive to pain. What would be a minor sensation for most people may feel  very painful if you have neuropathic pain. This is usually a long-term condition that can be difficult to treat. The type of pain can differ from person to person. It may start suddenly (acute), or it may develop slowly and last for a long time (chronic). Neuropathic pain may come and go as damaged nerves heal or may stay at the same level for years. It often causes emotional distress, loss of sleep, and a lower quality of life. CAUSES  The most common cause of damage to a sensory nerve is diabetes. Many other diseases and conditions can also cause neuropathic pain. Causes of neuropathic pain can be classified as:  Toxic. Many drugs and chemicals can cause toxic damage. The most common cause of toxic neuropathic pain is damage from drug treatment for cancer (chemotherapy).  Metabolic. This type of pain can happen when a disease causes imbalances that damage nerves. Diabetes is the most common of these diseases. Vitamin B deficiency caused by long-term alcohol abuse is another common cause.  Traumatic. Any injury that cuts, crushes, or stretches a nerve can cause damage and pain. A common example is feeling pain after losing an arm or leg (phantom limb pain).  Compression-related. If a sensory nerve gets trapped or compressed for a long period of time, the blood supply to the nerve can be cut off.  Vascular. Many blood vessel diseases can cause neuropathic pain by decreasing blood supply and oxygen to nerves.  Autoimmune. This type of pain results from diseases in which the body's defense system mistakenly attacks sensory nerves. Examples of autoimmune diseases that can cause neuropathic pain include lupus and multiple sclerosis.  Infectious. Many types of viral infections can damage sensory nerves and cause pain. Shingles infection is a common cause of this type of pain.  Inherited. Neuropathic pain can be a symptom of many diseases that are passed down through families (genetic). SIGNS AND  SYMPTOMS  The main symptom is pain. Neuropathic pain is often described as:  Burning.  Shock-like.  Stinging.  Hot or cold.  Itching. DIAGNOSIS  No single test can diagnose neuropathic pain. Your health care provider will do a physical exam and ask you about your pain. You may use a pain scale to describe how bad your pain is. You may also have tests to see if you have a high sensitivity to pain and to help find the cause and location of any sensory nerve damage. These tests may include:  Imaging studies, such as:  X-rays.  CT scan.  MRI.  Nerve conduction studies to test how well nerve signals travel through your sensory nerves (electrodiagnostic testing).  Stimulating your sensory nerves through electrodes on your skin and measuring the response in your spinal cord and brain (somatosensory evoked potentials). TREATMENT  Treatment for neuropathic pain may change over time. You may need to try different treatment options or a combination of treatments. Some options include:  Over-the-counter pain relievers.  Prescription medicines. Some medicines used to treat other conditions may also  help neuropathic pain. These include medicines to:  Control seizures (anticonvulsants).  Relieve depression (antidepressants).  Prescription-strength pain relievers (narcotics). These are usually used when other pain relievers do not help.  Transcutaneous nerve stimulation (TENS). This uses electrical currents to block painful nerve signals. The treatment is painless.  Topical and local anesthetics. These are medicines that numb the nerves. They can be injected as a nerve block or applied to the skin.  Alternative treatments, such as:  Acupuncture.  Meditation.  Massage.  Physical therapy.  Pain management programs.  Counseling. HOME CARE INSTRUCTIONS  Learn as much as you can about your condition.  Take medicines only as directed by your health care provider.  Work closely  with all your health care providers to find what works best for you.  Have a good support system at home.  Consider joining a chronic pain support group. SEEK MEDICAL CARE IF:  Your pain treatments are not helping.  You are having side effects from your medicines.  You are struggling with fatigue, mood changes, depression, or anxiety.   This information is not intended to replace advice given to you by your health care provider. Make sure you discuss any questions you have with your health care provider.   Document Released: 05/07/2004 Document Revised: 08/31/2014 Document Reviewed: 01/18/2014 Elsevier Interactive Patient Education [employer]2016 Elsevier Inc.

## 2015-11-21 NOTE — ED Notes (Addendum)
Pt complains of new tingling sensation in hands, feet and lower back.Has neuropathy,but states pain in areas are new.

## 2015-11-21 NOTE — ED Notes (Signed)
The pt reports that she has neuropathy and she is c/o pins and needles i all over her body  She is also c/o some swelling in her lower extremities  And she has had tingling in her head also lmp one week ago

## 2015-11-21 NOTE — ED Provider Notes (Signed)
TIME SEEN: 5:00 AM  CHIEF COMPLAINT: Increasing lower back pain, new lower extremity numbness, fecal incontinence  HPI: Pt is a 50 y.o. female with history of depression and anxiety, neuropathy, hypertension who presents to the emergency department with complaints of increasing tingling to her lower back and upper feet. Constantly has tingling and burning to the lower feet but states that these other areas are new for her over the past 24 hours. Also complains of increasing back pain. States she knows that she has a herniated disc. No history of injury to the back. Has had 3 weeks of fecal incontinence. No urinary incontinence or urinary retention. States she has not told anybody about this because it is "embarrassing". No history of back surgeries or epidural injections. No fevers. No unexplained weight loss. No history of cancer. She does not have HIV or diabetes.  PCP is [facility]  ROS: See HPI Constitutional: no fever  Eyes: no drainage  ENT: no runny nose   Cardiovascular:  no chest pain  Resp: no SOB  GI: no vomiting GU: no dysuria Integumentary: no rash  Allergy: no hives  Musculoskeletal: no leg swelling  Neurological: no slurred speech ROS otherwise negative  PAST MEDICAL HISTORY/PAST SURGICAL HISTORY:  Past Medical History  Diagnosis Date  . Depression   . Anxiety   . Carpal tunnel syndrome, bilateral   . GERD (gastroesophageal reflux disease)   . Neuropathy (HCC)   . Hypertension   . Asthma   . PUD (peptic ulcer disease)   . Bipolar disorder (HCC)   . Fx. left wrist   . Arthritis     lower back  . Muscle spasms of neck     MEDICATIONS:  Prior to Admission medications   Medication Sig Start Date End Date Taking? Authorizing Provider  albuterol (PROVENTIL HFA;VENTOLIN HFA) 108 (90 BASE) MCG/ACT inhaler Inhale 1-2 puffs into the lungs every 6 (six) hours as needed for wheezing or shortness of breath. 08/06/15   Jaclyn Shaggy, MD  atenolol (TENORMIN)  50 MG tablet Take 1 tablet (50 mg total) by mouth 2 (two) times daily. 08/06/15   Jaclyn Shaggy, MD  cetirizine (ZYRTEC) 10 MG tablet Take 1 tablet (10 mg total) by mouth daily. 11/19/15   Jaclyn Shaggy, MD  clindamycin (CLEOCIN) 300 MG capsule Take 1 capsule (300 mg total) by mouth 2 (two) times daily. 11/19/15   Jaclyn Shaggy, MD  cloNIDine (CATAPRES) 0.1 MG tablet Take 1 tablet (0.1 mg total) by mouth at bedtime. 08/06/15   Jaclyn Shaggy, MD  cyclobenzaprine (FLEXERIL) 10 MG tablet Take 1 tablet (10 mg total) by mouth 2 (two) times daily. 08/06/15   Jaclyn Shaggy, MD  gabapentin (NEURONTIN) 300 MG capsule Take 2 capsules (600 mg total) by mouth 3 (three) times daily. 10/23/15   Jaclyn Shaggy, MD  hydrOXYzine (ATARAX/VISTARIL) 50 MG tablet Take 1 tablet (50 mg total) by mouth 3 (three) times daily as needed for anxiety or itching. 10/23/15   Jaclyn Shaggy, MD  lisinopril-hydrochlorothiazide (PRINZIDE,ZESTORETIC) 20-25 MG tablet Take 1 tablet by mouth daily. 10/23/15   Jaclyn Shaggy, MD  mirtazapine (REMERON) 30 MG tablet Take 45 mg by mouth at bedtime.    Historical Provider, MD  naltrexone (DEPADE) 50 MG tablet Take 0.5 tablets (25 mg total) by mouth daily. 11/21/14   Adonis Brook, NP  olopatadine (PATANOL) 0.1 % ophthalmic solution Place 1 drop into both eyes 2 (two) times daily. 11/19/15   Jaclyn Shaggy, MD  omeprazole (PRILOSEC) 40  MG capsule Take 1 capsule (40 mg total) by mouth daily. 10/23/15   Jaclyn ShaggyEnobong Amao, MD  topiramate (TOPAMAX) 50 MG tablet Take 1 tablet (50 mg total) by mouth 2 (two) times daily. Patient 08/06/15   Jaclyn ShaggyEnobong Amao, MD  traMADol (ULTRAM) 50 MG tablet Take 1 tablet (50 mg total) by mouth every 12 (twelve) hours as needed. 10/23/15   Jaclyn ShaggyEnobong Amao, MD  traZODone (DESYREL) 300 MG tablet Take 1 tablet (300 mg total) by mouth at bedtime. 11/21/14   Adonis BrookSheila Agustin, NP    ALLERGIES:  Allergies  Allergen Reactions  . Penicillins Anaphylaxis    Has patient had a PCN reaction causing immediate rash,  facial/tongue/throat swelling, SOB or lightheadedness with hypotension: Yes Has patient had a PCN reaction causing severe rash involving mucus membranes or skin necrosis: Yes Has patient had a PCN reaction that required hospitalization Yes Has patient had a PCN reaction occurring within the last 10 years: No-more than 10 years ago If all of the above answers are "NO", then may proceed with Cephalosporin use.     SOCIAL HISTORY:  Social History  Substance Use Topics  . Smoking status: Never Smoker   . Smokeless tobacco: Never Used  . Alcohol Use: No    FAMILY HISTORY: Family History  Problem Relation Age of Onset  . Cancer Mother     pancreatic  . Heart failure Father   . Diabetes Father   . Cancer Paternal Grandmother   . HIV/AIDS Brother     EXAM: BP 145/91 mmHg  Pulse 85  Temp(Src) 97.4 F (36.3 C) (Oral)  Resp 18  Ht 5\' 6"  (1.676 m)  Wt 153 lb (69.4 kg)  BMI 24.71 kg/m2  SpO2 100%  LMP 11/14/2015 CONSTITUTIONAL: Alert and oriented and responds appropriately to questions. Well-appearing; well-nourished HEAD: Normocephalic EYES: Conjunctivae clear, PERRL ENT: normal nose; no rhinorrhea; moist mucous membranes NECK: Supple, no meningismus, no LAD  CARD: RRR; S1 and S2 appreciated; no murmurs, no clicks, no rubs, no gallops RESP: Normal chest excursion without splinting or tachypnea; breath sounds clear and equal bilaterally; no wheezes, no rhonchi, no rales, no hypoxia or respiratory distress, speaking full sentences ABD/GI: Normal bowel sounds; non-distended; soft, non-tender, no rebound, no guarding, no peritoneal signs RECTAL: Normal rectal tone, no gross blood or melena, no fecal impaction, no hemorrhoids BACK:  The back appears normal and is non-tender to palpation, there is no CVA tenderness EXT: Normal ROM in all joints; non-tender to palpation; no edema; normal capillary refill; no cyanosis, no calf tenderness or swelling    SKIN: Normal color for age and  race; warm; no rash NEURO: Moves all extremities equally, reports diminished sensation in her bilateral feet and distal lower extremities, strength 5/5 in all 4 extremity, 2+ deep tendon reflexes in bilateral upper and lower extent is, no clonus, cranial nerves II through XII intact; No clonus; normal gait, no saddle anesthesia PSYCH: The patient's mood and manner are appropriate. Grooming and personal hygiene are appropriate.  MEDICAL DECISION MAKING: Patient complaining of increasing lower extremity numbness compared to her baseline, increasing lower back pain and fecal incontinence. Concern for possible cauda equina. No history of back surgery, epidural injections, IV drug use, being immunocompromised, cancer. Doubt epidural hematoma or abscess. Doubt discitis. Will obtain a lumbar MRI. Postvoid residual is 78 mL. Urine shows no sign of infection and she is not pregnant. Will obtain labs. Will give IV morphine for pain control.  ED PROGRESS: Patient's labs are unremarkable. MRI of  her lumbar spine shows mild bilateral facet arthrosis at L4-L5 otherwise completely normal with no sign of cord compression, canal stenosis, cauda equina. I do not feel she needs an MRI of her cervical or thoracic spine she has normal strength, normal reflexes, normal gait, normal postvoid residual and only decreased sensation in her distal lower extremities bilaterally. She does have a PCP for outpatient follow-up. We'll discharge with short prescription for pain medication for her increase neuropathy. I do not feel she needs further emergent workup at this time.   At this time, I do not feel there is any life-threatening condition present. I have reviewed and discussed all results (EKG, imaging, lab, urine as appropriate), exam findings with patient. I have reviewed nursing notes and appropriate previous records.  I feel the patient is safe to be discharged home without further emergent workup. Discussed usual and customary  return precautions. Patient and family (if present) verbalize understanding and are comfortable with this plan.  Patient will follow-up with their primary care provider. If they do not have a primary care provider, information for follow-up has been provided to them. All questions have been answered.      Layla Maw Ward, DO 11/21/15 [phone]

## 2015-11-27 ENCOUNTER — Other Ambulatory Visit: Payer: Self-pay | Admitting: Family Medicine

## 2015-11-27 ENCOUNTER — Ambulatory Visit (HOSPITAL_COMMUNITY)
Admission: RE | Admit: 2015-11-27 | Discharge: 2015-11-27 | Disposition: A | Discharge status: Home / Self Care | Payer: Self-pay | Source: Ambulatory Visit | Attending: Sports Medicine | Admitting: Sports Medicine

## 2015-11-27 ENCOUNTER — Ambulatory Visit: Payer: No Typology Code available for payment source | Attending: Family Medicine

## 2015-11-27 DIAGNOSIS — M654 Radial styloid tenosynovitis [de Quervain]: Secondary | ICD-10-CM | POA: Insufficient documentation

## 2015-11-27 DIAGNOSIS — R936 Abnormal findings on diagnostic imaging of limbs: Secondary | ICD-10-CM | POA: Insufficient documentation

## 2015-11-27 DIAGNOSIS — I10 Essential (primary) hypertension: Secondary | ICD-10-CM | POA: Insufficient documentation

## 2015-11-27 DIAGNOSIS — M25532 Pain in left wrist: Secondary | ICD-10-CM | POA: Insufficient documentation

## 2015-11-27 MED FILL — OMEPRAZOLE DR 40 MG CAPSULE: 40 | 30 days supply | Qty: 30 | Fill #1

## 2015-11-27 MED FILL — TOPIRAMATE 50 MG TABLET: 50 | 30 days supply | Qty: 60 | Fill #1

## 2015-11-27 MED FILL — cloNIDine HCL 0.1 MG TABS: 0.1 | 30 days supply | Qty: 30 | Fill #3

## 2015-11-27 MED FILL — LISINOPRIL-HCTZ 20-25 MG TA: 20-25 | 30 days supply | Qty: 30 | Fill #1

## 2015-11-27 MED FILL — ATENOLOL 50 MG TABLET: 50 | 30 days supply | Qty: 60 | Fill #0

## 2015-11-28 LAB — COMPLETE METABOLIC PANEL WITH GFR
ALT: 9 U/L (ref 6–29)
AST: 13 U/L (ref 10–35)
Albumin: 3.5 g/dL — ABNORMAL LOW (ref 3.6–5.1)
Alkaline Phosphatase: 60 U/L (ref 33–130)
BUN: 10 mg/dL (ref 7–25)
CO2: 26 mmol/L (ref 20–31)
Calcium: 8.5 mg/dL — ABNORMAL LOW (ref 8.6–10.4)
Chloride: 100 mmol/L (ref 98–110)
Creat: 0.73 mg/dL (ref 0.50–1.05)
GFR, Est African American: >89 mL/min (ref >=60)
GFR, Est Non African American: >89 mL/min (ref >=60)
Glucose, Bld: 83 mg/dL (ref 65–99)
Potassium: 4.4 mmol/L (ref 3.5–5.3)
Sodium: 137 mmol/L (ref 135–146)
Total Bilirubin: 0.3 mg/dL (ref 0.2–1.2)
Total Protein: 5.7 g/dL — ABNORMAL LOW (ref 6.1–8.1)

## 2015-11-28 LAB — LIPID PANEL
Cholesterol: 161 mg/dL (ref 125–200)
HDL: 56 mg/dL (ref >=46)
LDL Cholesterol: 96 mg/dL (ref <130)
Total CHOL/HDL Ratio: 2.9 Ratio (ref <=5.0)
Triglycerides: 47 mg/dL (ref <150)
VLDL: 9 mg/dL (ref <30)

## 2015-12-02 ENCOUNTER — Other Ambulatory Visit: Payer: Self-pay | Admitting: Family Medicine

## 2015-12-02 DIAGNOSIS — Z880 Allergy status to penicillin: Secondary | ICD-10-CM | POA: Insufficient documentation

## 2015-12-02 DIAGNOSIS — G629 Polyneuropathy, unspecified: Secondary | ICD-10-CM | POA: Insufficient documentation

## 2015-12-02 DIAGNOSIS — Z79899 Other long term (current) drug therapy: Secondary | ICD-10-CM | POA: Insufficient documentation

## 2015-12-02 DIAGNOSIS — K219 Gastro-esophageal reflux disease without esophagitis: Secondary | ICD-10-CM | POA: Insufficient documentation

## 2015-12-02 DIAGNOSIS — J45909 Unspecified asthma, uncomplicated: Secondary | ICD-10-CM | POA: Insufficient documentation

## 2015-12-02 DIAGNOSIS — F329 Major depressive disorder, single episode, unspecified: Secondary | ICD-10-CM | POA: Insufficient documentation

## 2015-12-02 DIAGNOSIS — Z8711 Personal history of peptic ulcer disease: Secondary | ICD-10-CM | POA: Insufficient documentation

## 2015-12-02 DIAGNOSIS — F419 Anxiety disorder, unspecified: Secondary | ICD-10-CM | POA: Insufficient documentation

## 2015-12-02 DIAGNOSIS — I10 Essential (primary) hypertension: Secondary | ICD-10-CM | POA: Insufficient documentation

## 2015-12-02 MED FILL — OMEPRAZOLE DR 40 MG CAPSULE: 40 | 30 days supply | Qty: 30 | Fill #2

## 2015-12-02 MED FILL — cloNIDine HCL 0.1 MG TABS: 0.1 | 30 days supply | Qty: 30 | Fill #4

## 2015-12-02 MED FILL — ?CETIRIZINE HCL 10 MG TABLE: 10 | 30 days supply | Qty: 30 | Fill #1

## 2015-12-02 MED FILL — LISINOPRIL-HCTZ 20-25 MG TA: 20-25 | 30 days supply | Qty: 30 | Fill #2

## 2015-12-02 MED FILL — TOPIRAMATE 50 MG TABLET: 50 | 30 days supply | Qty: 60 | Fill #2

## 2015-12-02 MED FILL — !VENTOLIN HFA INHALER: 108 (90 BAS | 25 days supply | Qty: 18 | Fill #0

## 2015-12-03 ENCOUNTER — Emergency Department (HOSPITAL_COMMUNITY)
Admission: EM | Admit: 2015-12-03 | Discharge: 2015-12-03 | Disposition: A | Discharge status: Home / Self Care | Payer: No Typology Code available for payment source | Attending: Emergency Medicine | Admitting: Emergency Medicine

## 2015-12-03 ENCOUNTER — Encounter (HOSPITAL_COMMUNITY): Payer: Self-pay | Admitting: *Deleted

## 2015-12-03 DIAGNOSIS — G629 Polyneuropathy, unspecified: Secondary | ICD-10-CM

## 2015-12-03 MED ORDER — OXYCODONE-ACETAMINOPHEN 5-325 MG PO TABS: 1.0000 | ORAL_TABLET | ORAL | Status: DC | PRN

## 2015-12-03 MED ORDER — OXYCODONE-ACETAMINOPHEN 5-325 MG PO TABS
2.0000 | ORAL_TABLET | Freq: Once | ORAL | Status: AC
  Administered 2015-12-03: 2 via ORAL
  Filled 2015-12-03: qty 2

## 2015-12-03 MED FILL — ?OLOPATADINE HCL 0.1% EYE D: 0.1% | 30 days supply | Qty: 5 | Fill #1

## 2015-12-03 MED FILL — ATENOLOL 50 MG TABLET: 50 | 30 days supply | Qty: 60 | Fill #1

## 2015-12-03 MED FILL — GABAPENTIN 300 MG CAPSULE: 300 | 20 days supply | Qty: 120 | Fill #2

## 2015-12-03 NOTE — Discharge Instructions (Signed)

## 2015-12-03 NOTE — ED Notes (Signed)
Pt c/o neuropathy. Pt states her feet have started to "lock up" on her and feels like she is sitting on her tail bone. Was seen here for similar symptoms 3/30

## 2015-12-03 NOTE — ED Notes (Signed)
Patient able to ambulate independently  

## 2015-12-03 NOTE — ED Provider Notes (Signed)
CSN: [id number]     Arrival date & time 12/02/15  2344 History   First MD Initiated Contact with Patient 12/03/15 0054     Chief Complaint  Patient presents with  . Foot Pain     (Consider location/radiation/quality/duration/timing/severity/associated sxs/prior Treatment) Patient is a 50 y.o. female presenting with lower extremity pain. The history is provided by the patient. No language interpreter was used.  Foot Pain This is a chronic problem. The current episode started more than 1 year ago. The problem occurs constantly. The problem has been gradually worsening. Pertinent negatives include no fever. The symptoms are aggravated by walking. She has tried nothing for the symptoms. The treatment provided no relief.   Pt has a history of neuropathy and chronic back problems.  Pt was seen here on 3/30 due to increasing symptoms and had an Mri of her back.  Pt reports she is out of pain medications.  [facility]. Pt reports she used to see Dr. Ethelene Halramos but is not currently able to see him because of insurance coverage.  Pt reports pain is bad tonight.  Past Medical History  Diagnosis Date  . Depression   . Anxiety   . Carpal tunnel syndrome, bilateral   . GERD (gastroesophageal reflux disease)   . Neuropathy (HCC)   . Hypertension   . Asthma   . PUD (peptic ulcer disease)   . Bipolar disorder (HCC)   . Fx. left wrist   . Arthritis     lower back  . Muscle spasms of neck    Past Surgical History  Procedure Laterality Date  . Rotator cuff repair Right 2011  . Hammer toe fusion Bilateral 2008  . Carpal tunnel release Right 2003  . Cholecystectomy N/A 02/09/2014    Procedure: LAPAROSCOPIC CHOLECYSTECTOMY WITH INTRAOPERATIVE CHOLANGIOGRAM;  Surgeon: Valarie MerinoMatthew B Martin, MD;  Location: [facility];  Service: General;  Laterality: N/A;   Family History  Problem Relation Age of Onset  . Cancer Mother     pancreatic  . Heart failure Father   . Diabetes Father   .  Cancer Paternal Grandmother   . HIV/AIDS Brother    Social History  Substance Use Topics  . Smoking status: Never Smoker   . Smokeless tobacco: Never Used  . Alcohol Use: No   OB History    No data available     Review of Systems  Constitutional: Negative for fever.  All other systems reviewed and are negative.     Allergies  Penicillins  Home Medications   Prior to Admission medications   Medication Sig Start Date End Date Taking? Authorizing Provider  atenolol (TENORMIN) 50 MG tablet TAKE 1 TABLET BY MOUTH 2 TIMES DAILY 11/27/15   Jaclyn ShaggyEnobong Amao, MD  cetirizine (ZYRTEC) 10 MG tablet Take 1 tablet (10 mg total) by mouth daily. 11/19/15   Jaclyn ShaggyEnobong Amao, MD  clindamycin (CLEOCIN) 300 MG capsule Take 1 capsule (300 mg total) by mouth 2 (two) times daily. 11/19/15   Jaclyn ShaggyEnobong Amao, MD  cloNIDine (CATAPRES) 0.1 MG tablet Take 1 tablet (0.1 mg total) by mouth at bedtime. 08/06/15   Jaclyn ShaggyEnobong Amao, MD  cyclobenzaprine (FLEXERIL) 10 MG tablet Take 1 tablet (10 mg total) by mouth 2 (two) times daily. Patient taking differently: Take 10 mg by mouth 2 (two) times daily as needed for muscle spasms.  08/06/15   Jaclyn ShaggyEnobong Amao, MD  divalproex (DEPAKOTE) 500 MG DR tablet Take 500 mg by mouth 3 (three) times daily.  Historical Provider, MD  gabapentin (NEURONTIN) 300 MG capsule Take 2 capsules (600 mg total) by mouth 3 (three) times daily. 10/23/15   Jaclyn Shaggy, MD  hydrOXYzine (ATARAX/VISTARIL) 50 MG tablet Take 1 tablet (50 mg total) by mouth 3 (three) times daily as needed for anxiety or itching. 10/23/15   Jaclyn Shaggy, MD  ibuprofen (ADVIL,MOTRIN) 800 MG tablet Take 1 tablet (800 mg total) by mouth every 8 (eight) hours as needed for mild pain. 11/21/15   Kristen N Ward, DO  lisinopril-hydrochlorothiazide (PRINZIDE,ZESTORETIC) 20-25 MG tablet Take 1 tablet by mouth daily. 10/23/15   Jaclyn Shaggy, MD  mirtazapine (REMERON) 30 MG tablet Take 45 mg by mouth at bedtime.    Historical Provider, MD   olopatadine (PATANOL) 0.1 % ophthalmic solution Place 1 drop into both eyes 2 (two) times daily. 11/19/15   Jaclyn Shaggy, MD  omeprazole (PRILOSEC) 40 MG capsule Take 1 capsule (40 mg total) by mouth daily. 10/23/15   Jaclyn Shaggy, MD  oxyCODONE-acetaminophen (PERCOCET/ROXICET) 5-325 MG tablet Take 1-2 tablets by mouth every 4 (four) hours as needed. 11/21/15   Kristen N Ward, DO  topiramate (TOPAMAX) 50 MG tablet Take 1 tablet (50 mg total) by mouth 2 (two) times daily. Patient 08/06/15   Jaclyn Shaggy, MD  traZODone (DESYREL) 300 MG tablet Take 1 tablet (300 mg total) by mouth at bedtime. 11/21/14   Adonis Brook, NP  VENTOLIN HFA 108 (90 Base) MCG/ACT inhaler INHALE 1-2 PUFFS INTO THE LUNGS EVERY 6 HOURS AS NEEDED FOR WHEEZING OR SHORTNESS OF BREATH. 12/02/15   Jaclyn Shaggy, MD   BP 140/94 mmHg  Pulse 119  Temp(Src) 98.1 F (36.7 C) (Oral)  Resp 22  Ht  (1.727 m)  Wt 86.779 kg  BMI 29.10 kg/m2  SpO2 100%  LMP 11/14/2015 Physical Exam  Constitutional: She is oriented to person, place, and time. She appears well-developed and well-nourished.  HENT:  Head: Normocephalic.  Eyes: EOM are normal.  Neck: Normal range of motion.  Pulmonary/Chest: Effort normal.  Abdominal: She exhibits no distension.  Musculoskeletal: Normal range of motion.  Good pulses feet, no ulcers, bilat great toes dark and thick. Normal cap refill  Neurological: She is alert and oriented to person, place, and time.  Psychiatric: She has a normal mood and affect.  Nursing note and vitals reviewed.   ED Course  Procedures (including critical care time) Labs Review Labs Reviewed - No data to display  Imaging Review No results found. I have personally reviewed and evaluated these images and lab results as part of my medical decision-making.   EKG Interpretation None      MDM  Pt given 2 percocet here.  Pt advised she needs to discuss ongoing pain management with her MD.    Final diagnoses:  Neuropathy  (HCC)   Meds ordered this encounter  Medications  . oxyCODONE-acetaminophen (PERCOCET/ROXICET) 5-325 MG per tablet 2 tablet    Sig:   . oxyCODONE-acetaminophen (PERCOCET/ROXICET) 5-325 MG tablet    Sig: Take 1-2 tablets by mouth every 4 (four) hours as needed.    Dispense:  10 tablet    Refill:  0    Order Specific Question:  Supervising Provider    Answer:  Eber Hong [[id number]]        Lonia Skinner [place], PA-C 12/03/15 0128  Shon Baton, MD 12/09/15 1946

## 2015-12-05 ENCOUNTER — Other Ambulatory Visit: Payer: Self-pay

## 2015-12-05 ENCOUNTER — Emergency Department (HOSPITAL_COMMUNITY): Payer: Self-pay

## 2015-12-05 ENCOUNTER — Encounter (HOSPITAL_COMMUNITY): Payer: Self-pay | Admitting: Emergency Medicine

## 2015-12-05 DIAGNOSIS — F419 Anxiety disorder, unspecified: Secondary | ICD-10-CM | POA: Insufficient documentation

## 2015-12-05 DIAGNOSIS — Z79899 Other long term (current) drug therapy: Secondary | ICD-10-CM | POA: Insufficient documentation

## 2015-12-05 DIAGNOSIS — G629 Polyneuropathy, unspecified: Secondary | ICD-10-CM | POA: Insufficient documentation

## 2015-12-05 DIAGNOSIS — R0602 Shortness of breath: Secondary | ICD-10-CM | POA: Insufficient documentation

## 2015-12-05 DIAGNOSIS — K219 Gastro-esophageal reflux disease without esophagitis: Secondary | ICD-10-CM | POA: Insufficient documentation

## 2015-12-05 DIAGNOSIS — Z792 Long term (current) use of antibiotics: Secondary | ICD-10-CM | POA: Insufficient documentation

## 2015-12-05 DIAGNOSIS — J45909 Unspecified asthma, uncomplicated: Secondary | ICD-10-CM | POA: Insufficient documentation

## 2015-12-05 DIAGNOSIS — I10 Essential (primary) hypertension: Secondary | ICD-10-CM | POA: Insufficient documentation

## 2015-12-05 DIAGNOSIS — F319 Bipolar disorder, unspecified: Secondary | ICD-10-CM | POA: Insufficient documentation

## 2015-12-05 DIAGNOSIS — Z8781 Personal history of (healed) traumatic fracture: Secondary | ICD-10-CM | POA: Insufficient documentation

## 2015-12-05 DIAGNOSIS — Z880 Allergy status to penicillin: Secondary | ICD-10-CM | POA: Insufficient documentation

## 2015-12-05 DIAGNOSIS — Z8711 Personal history of peptic ulcer disease: Secondary | ICD-10-CM | POA: Insufficient documentation

## 2015-12-05 DIAGNOSIS — K5900 Constipation, unspecified: Secondary | ICD-10-CM | POA: Insufficient documentation

## 2015-12-05 LAB — BASIC METABOLIC PANEL
Anion gap: 10 (ref 5–15)
BUN: 10 mg/dL (ref 6–20)
CO2: 24 mmol/L (ref 22–32)
Calcium: 8.8 mg/dL — ABNORMAL LOW (ref 8.9–10.3)
Chloride: 104 mmol/L (ref 101–111)
Creatinine, Ser: 1 mg/dL (ref 0.44–1.00)
GFR calc Af Amer: >60 mL/min (ref >60)
GFR calc non Af Amer: >60 mL/min (ref >60)
Glucose, Bld: 113 mg/dL — ABNORMAL HIGH (ref 65–99)
Potassium: 3.8 mmol/L (ref 3.5–5.1)
Sodium: 138 mmol/L (ref 135–145)

## 2015-12-05 LAB — CBC
HCT: 34.1 % — ABNORMAL LOW (ref 36.0–46.0)
Hemoglobin: 11.1 g/dL — ABNORMAL LOW (ref 12.0–15.0)
MCH: 31 pg (ref 26.0–34.0)
MCHC: 32.6 g/dL (ref 30.0–36.0)
MCV: 95.3 fL (ref 78.0–100.0)
Platelets: 195 10*3/uL (ref 150–400)
RBC: 3.58 MIL/uL — ABNORMAL LOW (ref 3.87–5.11)
RDW: 12.8 % (ref 11.5–15.5)
WBC: 7.2 10*3/uL (ref 4.0–10.5)

## 2015-12-05 LAB — I-STAT TROPONIN, ED: Troponin i, poc: 0 ng/mL (ref 0.00–0.08)

## 2015-12-05 NOTE — ED Notes (Signed)
Pt. reports left chest pain onset last week with mild SOB , denies nausea or diaphoresis .

## 2015-12-06 ENCOUNTER — Encounter (HOSPITAL_COMMUNITY): Payer: Self-pay | Admitting: Emergency Medicine

## 2015-12-06 ENCOUNTER — Emergency Department (HOSPITAL_COMMUNITY)
Admission: EM | Admit: 2015-12-06 | Discharge: 2015-12-06 | Disposition: A | Discharge status: Home / Self Care | Payer: Self-pay | Attending: Emergency Medicine | Admitting: Emergency Medicine

## 2015-12-06 DIAGNOSIS — K219 Gastro-esophageal reflux disease without esophagitis: Secondary | ICD-10-CM

## 2015-12-06 LAB — I-STAT TROPONIN, ED: Troponin i, poc: 0 ng/mL (ref 0.00–0.08)

## 2015-12-06 MED ORDER — SUCRALFATE 1 GM/10ML PO SUSP: 1.0000 g | Freq: Three times a day (TID) | ORAL | Status: DC

## 2015-12-06 MED ORDER — GI COCKTAIL ~~LOC~~
30.0000 mL | Freq: Once | ORAL | Status: AC
  Administered 2015-12-06: 30 mL via ORAL
  Filled 2015-12-06: qty 30

## 2015-12-06 NOTE — ED Provider Notes (Signed)
CSN: [id number]     Arrival date & time 12/05/15  2239 History  By signing my name below, I, Marisue HumbleMichelle Chaffee, attest that this documentation has been prepared under the direction and in the presence of Haeven Nickle, MD . Electronically Signed: Marisue HumbleMichelle Chaffee, Scribe. 12/06/2015. 3:52 AM.     Chief Complaint  Patient presents with  . Chest Pain   Patient is a 50 y.o. female presenting with chest pain. The history is provided by the patient. No language interpreter was used.  Chest Pain Pain location:  Epigastric Pain quality: sharp   Pain radiates to:  Does not radiate Pain radiates to the back: no   Pain severity:  Moderate Onset quality:  Gradual Timing:  Intermittent Progression:  Waxing and waning Chronicity:  New Context: not lifting   Relieved by:  Nothing Worsened by:  Nothing tried Ineffective treatments:  None tried Associated symptoms: shortness of breath   Associated symptoms: no nausea, no numbness, no palpitations and no PND   Risk factors: not female and no prior DVT/PE    HPI Comments:  Warden FillersDonna A Mehaffey is a 50 y.o. female with PMHx of GERD and HTN who presents to the Emergency Department complaining of intermittent, sharp epigastric chest pain onset ~2 wk ago, with last episode ~8 hours ago. She reports episodes last longer than 1 minute and are occasionally alleviated when laying down. She is not experiencing pain on exam. Pt reports associated mild shortness of breath with pain. Pt reports her last BM today. No treatments attempted PTA; pt states she is taking Protonix which does not help. Pt was seen by Dr. Wilkie AyeHorton for foot pain in past 48 hours and did not mention chest pain at that visit. Pt denies recent trauma, fall, or diet change  Past Medical History  Diagnosis Date  . Depression   . Anxiety   . Carpal tunnel syndrome, bilateral   . GERD (gastroesophageal reflux disease)   . Neuropathy (HCC)   . Hypertension   . Asthma   . PUD (peptic ulcer disease)   .  Bipolar disorder (HCC)   . Fx. left wrist   . Arthritis     lower back  . Muscle spasms of neck    Past Surgical History  Procedure Laterality Date  . Rotator cuff repair Right 2011  . Hammer toe fusion Bilateral 2008  . Carpal tunnel release Right 2003  . Cholecystectomy N/A 02/09/2014    Procedure: LAPAROSCOPIC CHOLECYSTECTOMY WITH INTRAOPERATIVE CHOLANGIOGRAM;  Surgeon: Valarie MerinoMatthew B Martin, MD;  Location: [facility];  Service: General;  Laterality: N/A;   Family History  Problem Relation Age of Onset  . Cancer Mother     pancreatic  . Heart failure Father   . Diabetes Father   . Cancer Paternal Grandmother   . HIV/AIDS Brother    Social History  Substance Use Topics  . Smoking status: Never Smoker   . Smokeless tobacco: Never Used  . Alcohol Use: No   OB History    No data available     Review of Systems  Respiratory: Positive for shortness of breath.   Cardiovascular: Positive for chest pain. Negative for palpitations, leg swelling and PND.  Gastrointestinal: Negative for nausea.  Neurological: Negative for numbness.  All other systems reviewed and are negative.  Allergies  Penicillins  Home Medications   Prior to Admission medications   Medication Sig Start Date End Date Taking? Authorizing Provider  atenolol (TENORMIN) 50 MG tablet TAKE 1 TABLET BY  MOUTH 2 TIMES DAILY 11/27/15   Jaclyn Shaggy, MD  cetirizine (ZYRTEC) 10 MG tablet Take 1 tablet (10 mg total) by mouth daily. 11/19/15   Jaclyn Shaggy, MD  clindamycin (CLEOCIN) 300 MG capsule Take 1 capsule (300 mg total) by mouth 2 (two) times daily. 11/19/15   Jaclyn Shaggy, MD  cloNIDine (CATAPRES) 0.1 MG tablet Take 1 tablet (0.1 mg total) by mouth at bedtime. 08/06/15   Jaclyn Shaggy, MD  cyclobenzaprine (FLEXERIL) 10 MG tablet Take 1 tablet (10 mg total) by mouth 2 (two) times daily. Patient taking differently: Take 10 mg by mouth 2 (two) times daily as needed for muscle spasms.  08/06/15   Jaclyn Shaggy, MD  divalproex  (DEPAKOTE) 500 MG DR tablet Take 500 mg by mouth 3 (three) times daily.    Historical Provider, MD  gabapentin (NEURONTIN) 300 MG capsule Take 2 capsules (600 mg total) by mouth 3 (three) times daily. 10/23/15   Jaclyn Shaggy, MD  hydrOXYzine (ATARAX/VISTARIL) 50 MG tablet Take 1 tablet (50 mg total) by mouth 3 (three) times daily as needed for anxiety or itching. 10/23/15   Jaclyn Shaggy, MD  ibuprofen (ADVIL,MOTRIN) 800 MG tablet Take 1 tablet (800 mg total) by mouth every 8 (eight) hours as needed for mild pain. 11/21/15   Kristen N Ward, DO  lisinopril-hydrochlorothiazide (PRINZIDE,ZESTORETIC) 20-25 MG tablet Take 1 tablet by mouth daily. 10/23/15   Jaclyn Shaggy, MD  mirtazapine (REMERON) 30 MG tablet Take 45 mg by mouth at bedtime.    Historical Provider, MD  olopatadine (PATANOL) 0.1 % ophthalmic solution Place 1 drop into both eyes 2 (two) times daily. 11/19/15   Jaclyn Shaggy, MD  omeprazole (PRILOSEC) 40 MG capsule Take 1 capsule (40 mg total) by mouth daily. 10/23/15   Jaclyn Shaggy, MD  oxyCODONE-acetaminophen (PERCOCET/ROXICET) 5-325 MG tablet Take 1-2 tablets by mouth every 4 (four) hours as needed. 12/03/15   Elson Areas, PA-C  topiramate (TOPAMAX) 50 MG tablet Take 1 tablet (50 mg total) by mouth 2 (two) times daily. Patient 08/06/15   Jaclyn Shaggy, MD  traZODone (DESYREL) 300 MG tablet Take 1 tablet (300 mg total) by mouth at bedtime. 11/21/14   Adonis Brook, NP  VENTOLIN HFA 108 (90 Base) MCG/ACT inhaler INHALE 1-2 PUFFS INTO THE LUNGS EVERY 6 HOURS AS NEEDED FOR WHEEZING OR SHORTNESS OF BREATH. 12/02/15   Jaclyn Shaggy, MD   BP 107/79 mmHg  Pulse 79  Resp 21  SpO2 100%  LMP 11/14/2015 Physical Exam  Constitutional: She is oriented to person, place, and time. She appears well-developed and well-nourished. No distress.  HENT:  Head: Normocephalic and atraumatic.  Mouth/Throat: Oropharynx is clear and moist.  Eyes: EOM are normal. Pupils are equal, round, and reactive to light.  Neck:  Normal range of motion. Neck supple.  Cardiovascular: Normal rate and regular rhythm.   Pulmonary/Chest: Effort normal and breath sounds normal. No respiratory distress. She has no wheezes. She has no rales.  Abdominal: Soft. There is no tenderness. There is no rebound and no guarding.  very gassy; mild constipation  Musculoskeletal: Normal range of motion. She exhibits no edema or tenderness.  Neurological: She is alert and oriented to person, place, and time.  Skin: Skin is warm and dry. She is not diaphoretic.  Psychiatric: She has a normal mood and affect.   ED Course  Procedures  DIAGNOSTIC STUDIES:  Oxygen Saturation is 100% on RA, normal by my interpretation.    COORDINATION OF CARE:  3:03 AM  Discussed treatment plan with pt at bedside and pt agreed to plan.  Labs Review Labs Reviewed  BASIC METABOLIC PANEL - Abnormal; Notable for the following:    Glucose, Bld 113 (*)    Calcium 8.8 (*)    All other components within normal limits  CBC - Abnormal; Notable for the following:    RBC 3.58 (*)    Hemoglobin 11.1 (*)    HCT 34.1 (*)    All other components within normal limits  I-STAT TROPOININ, ED  Rosezena Sensor, ED    Imaging Review Dg Chest 2 View  12/06/2015  CLINICAL DATA:  Chest pain with shortness of breath EXAM: CHEST  2 VIEW COMPARISON:  June 26, 2015 FINDINGS: Lungs are clear. Heart size and pulmonary vascularity are normal. No adenopathy. There is postoperative change in the right shoulder. IMPRESSION: No edema or consolidation. Electronically Signed   By: Bretta Bang III M.D.   On: 12/06/2015 00:01   I have personally reviewed and evaluated these images and lab results as part of my medical decision-making.   EKG Interpretation   Date/Time:  Thursday Kellye Mizner 13 2017 22:45:11 EDT Ventricular Rate:  86 PR Interval:  156 QRS Duration: 80 QT Interval:  396 QTC Calculation: 473 R Axis:   63 Text Interpretation:  Normal sinus rhythm Confirmed by  [facility]  MD,  Ehab Humber ([id number]) on 12/06/2015 3:04:49 AM      MDM   Final diagnoses:  None   Results for orders placed or performed during the hospital encounter of 12/06/15  Basic metabolic panel  Result Value Ref Range   Sodium 138 135 - 145 mmol/L   Potassium 3.8 3.5 - 5.1 mmol/L   Chloride 104 101 - 111 mmol/L   CO2 24 22 - 32 mmol/L   Glucose, Bld 113 (H) 65 - 99 mg/dL   BUN 10 6 - 20 mg/dL   Creatinine, Ser 6.04 0.44 - 1.00 mg/dL   Calcium 8.8 (L) 8.9 - 10.3 mg/dL   GFR calc non Af Amer >60 >60 mL/min   GFR calc Af Amer >60 >60 mL/min   Anion gap 10 5 - 15  CBC  Result Value Ref Range   WBC 7.2 4.0 - 10.5 K/uL   RBC 3.58 (L) 3.87 - 5.11 MIL/uL   Hemoglobin 11.1 (L) 12.0 - 15.0 g/dL   HCT 54.0 (L) 98.1 - 19.1 %   MCV 95.3 78.0 - 100.0 fL   MCH 31.0 26.0 - 34.0 pg   MCHC 32.6 30.0 - 36.0 g/dL   RDW 47.8 29.5 - 62.1 %   Platelets 195 150 - 400 K/uL  I-stat troponin, ED  Result Value Ref Range   Troponin i, poc 0.00 0.00 - 0.08 ng/mL   Comment 3          I-stat troponin, ED  Result Value Ref Range   Troponin i, poc 0.00 0.00 - 0.08 ng/mL   Comment 3           Dg Chest 2 View  12/06/2015  CLINICAL DATA:  Chest pain with shortness of breath EXAM: CHEST  2 VIEW COMPARISON:  June 26, 2015 FINDINGS: Lungs are clear. Heart size and pulmonary vascularity are normal. No adenopathy. There is postoperative change in the right shoulder. IMPRESSION: No edema or consolidation. Electronically Signed   By: Bretta Bang III M.D.   On: 12/06/2015 00:01   Mr Lumbar Spine Wo Contrast  11/21/2015  CLINICAL DATA:  Initial evaluation for acute back  pain with fecal incontinence. EXAM: MRI LUMBAR SPINE WITHOUT CONTRAST TECHNIQUE: Multiplanar, multisequence MR imaging of the lumbar spine was performed. No intravenous contrast was administered. COMPARISON:  Prior study from 12/05/2014. FINDINGS: The purposes of this dictation, the lowest well-formed intervertebral disc spaces  presumed to be the L5-S1 level, and there presumed to be 5 lumbar type vertebral bodies. Mild levoscoliosis present. Vertebral bodies are otherwise normally aligned with preservation of the normal lumbar lordosis. Vertebral body heights well maintained. Signal intensity within the vertebral body bone marrow is normal. No focal osseous lesions. No marrow edema. Conus medullaris terminates normally at the L1 level. Signal intensity within the visualized cord is normal. Nerve roots of the cauda equina within normal limits. Paraspinous soft tissues are unremarkable. L1-2:  Negative. L2-3:  Negative. L3-4:  Negative. L4-5: No disc bulge or disc protrusion. Mild facet and ligamentum flavum hypertrophy. Trace effusions within the bilateral L4-5 facets. No significant canal or foraminal stenosis. L5-S1:  Negative. IMPRESSION: 1. Mild bilateral facet arthrosis at L4-5. This could potentially result in lower back pain. 2. Otherwise normal MRI of the lumbar spine. No evidence for cord compression, canal stenosis, or significant foraminal narrowing. Electronically Signed   By: Rise Mu M.D.   On: 11/21/2015 06:58   Mr Wrist Left Wo Contrast  11/27/2015  CLINICAL DATA:  Lateral right wrist pain and tenderness. History of fracture 1 year ago due to punching a wall. No recent injury. Initial encounter. EXAM: MR OF THE LEFT WRIST WITHOUT CONTRAST TECHNIQUE: Multiplanar, multisequence MR imaging of the left wrist was performed. No intravenous contrast was administered. COMPARISON:  Plain films of the right forearm 12/04/2014. Plain films the right wrist 11/14/2015. FINDINGS: Ligaments: Intact. Triangular fibrocartilage: Intact. Tendons: There is marked thickening and intrasubstance increased T2 signal with surrounding edema about the abductor pollicis longus and extensor pollicis brevis tendons. Fluid is present in the sheaths of the tendons. There is reactive marrow edema in the styloid process of the radius. No tear  is identified. Tendons about the wrist are otherwise unremarkable. Carpal tunnel/median nerve: The patient appears to be status post carpal tunnel release. Otherwise unremarkable. Guyon's canal: Unremarkable. Joint/cartilage: Unremarkable. Bones/carpal alignment: Ulnar minus variance is noted. A small cyst or enchondroma is seen in the proximal pole scaphoid. No fracture or worrisome marrow lesion. Other: None. 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 No tendon tear is seen. Ulnar minus variance. Small enchondroma or cyst in the proximal pole of the scaphoid. Electronically Signed   By: Drusilla Kanner M.D.   On: 11/27/2015 15:32    Filed Vitals:   12/06/15 0204 12/06/15 0314  BP: 100/67 107/79  Pulse: 78 79  Resp: 16 21   Medications  gi cocktail (Maalox,Lidocaine,Donnatal) (30 mLs Oral Given 12/06/15 0311)    Just seen 2 days ago but strangely did not mention CP.  PERC negative wells 0 highly doubt PE.  Suspect GERD as patient is gassy and complaining of epigastric issues.  Will start carafate and have patient follow up with her PMD.  Strict return precautions given.    I personally performed the services described in this documentation, which was scribed in my presence. The recorded information has been reviewed and is accurate.     Cy Blamer, MD 12/06/15 [phone]

## 2015-12-06 NOTE — ED Notes (Signed)
Pt left with all her belongings and ambulated out of the treatment area.  

## 2015-12-09 ENCOUNTER — Other Ambulatory Visit: Payer: Self-pay | Admitting: Family Medicine

## 2015-12-09 MED FILL — ?CYCLOBENZAPRINE 10 MG TABL: 10 | 30 days supply | Qty: 60 | Fill #0

## 2015-12-11 MED FILL — hydrOXYzine HCL 50 MG TABS: 50 | 30 days supply | Qty: 90 | Fill #1

## 2015-12-12 ENCOUNTER — Telehealth: Payer: Self-pay | Admitting: Family Medicine

## 2015-12-12 NOTE — Telephone Encounter (Signed)
Patient came in requesting a referral for Pain Management. Patient now has [facility] 100%

## 2015-12-12 NOTE — Telephone Encounter (Signed)
The referral was already sent since 10/2015  To [facility]  Phone # [phone]

## 2015-12-15 ENCOUNTER — Emergency Department (HOSPITAL_COMMUNITY)
Admission: EM | Admit: 2015-12-15 | Discharge: 2015-12-15 | Disposition: A | Discharge status: Home / Self Care | Payer: No Typology Code available for payment source | Attending: Emergency Medicine | Admitting: Emergency Medicine

## 2015-12-15 ENCOUNTER — Encounter (HOSPITAL_COMMUNITY): Payer: Self-pay | Admitting: Emergency Medicine

## 2015-12-15 DIAGNOSIS — K219 Gastro-esophageal reflux disease without esophagitis: Secondary | ICD-10-CM | POA: Insufficient documentation

## 2015-12-15 DIAGNOSIS — I10 Essential (primary) hypertension: Secondary | ICD-10-CM | POA: Insufficient documentation

## 2015-12-15 DIAGNOSIS — Z23 Encounter for immunization: Secondary | ICD-10-CM | POA: Insufficient documentation

## 2015-12-15 DIAGNOSIS — M4696 Unspecified inflammatory spondylopathy, lumbar region: Secondary | ICD-10-CM | POA: Insufficient documentation

## 2015-12-15 DIAGNOSIS — G629 Polyneuropathy, unspecified: Secondary | ICD-10-CM | POA: Insufficient documentation

## 2015-12-15 DIAGNOSIS — Z791 Long term (current) use of non-steroidal anti-inflammatories (NSAID): Secondary | ICD-10-CM | POA: Insufficient documentation

## 2015-12-15 DIAGNOSIS — J45909 Unspecified asthma, uncomplicated: Secondary | ICD-10-CM | POA: Insufficient documentation

## 2015-12-15 DIAGNOSIS — F329 Major depressive disorder, single episode, unspecified: Secondary | ICD-10-CM | POA: Insufficient documentation

## 2015-12-15 DIAGNOSIS — Z79899 Other long term (current) drug therapy: Secondary | ICD-10-CM | POA: Insufficient documentation

## 2015-12-15 DIAGNOSIS — L02412 Cutaneous abscess of left axilla: Secondary | ICD-10-CM

## 2015-12-15 DIAGNOSIS — Z79891 Long term (current) use of opiate analgesic: Secondary | ICD-10-CM | POA: Insufficient documentation

## 2015-12-15 DIAGNOSIS — Z792 Long term (current) use of antibiotics: Secondary | ICD-10-CM | POA: Insufficient documentation

## 2015-12-15 MED ORDER — IBUPROFEN 800 MG PO TABS: 800.0000 mg | ORAL_TABLET | Freq: Three times a day (TID) | ORAL | Status: DC | PRN

## 2015-12-15 MED ORDER — HYDROCODONE-ACETAMINOPHEN 5-325 MG PO TABS: 1.0000 | ORAL_TABLET | Freq: Four times a day (QID) | ORAL | Status: DC | PRN

## 2015-12-15 MED ORDER — CLINDAMYCIN HCL 150 MG PO CAPS: 300.0000 mg | ORAL_CAPSULE | Freq: Three times a day (TID) | ORAL | Status: DC

## 2015-12-15 MED ORDER — TETANUS-DIPHTH-ACELL PERTUSSIS 5-2.5-18.5 LF-MCG/0.5 IM SUSP
0.5000 mL | Freq: Once | INTRAMUSCULAR | Status: AC
  Administered 2015-12-15: 0.5 mL via INTRAMUSCULAR
  Filled 2015-12-15: qty 0.5

## 2015-12-15 MED ORDER — IBUPROFEN 800 MG PO TABS
800.0000 mg | ORAL_TABLET | Freq: Once | ORAL | Status: DC
  Filled 2015-12-15: qty 1

## 2015-12-15 NOTE — ED Provider Notes (Signed)
CSN: [id number]     Arrival date & time 12/15/15  1753 History  By signing my name below, I, Bethel Born, attest that this documentation has been prepared under the direction and in the presence of Fayrene Helper PA-C. Electronically Signed: Bethel Born, ED Scribe. 12/15/2015 6:48 PM      Chief Complaint  Patient presents with  . Abscess   The history is provided by the patient. No language interpreter was used.   Amanda [name] is a 50 y.o. female who presents to the Emergency Department complaining of 3 small and painful abscesses at the left axilla with onset 2 days ago. Pt describes the constant pain as a 7/10 in severity throbbing that has not been relieved by the application of warm compresses.  She has had abscesses under her arm in the past that have required draining. Pt denies fever and difficulty breathing. NKDA. Last tetanus is unknown.     Past Medical History  Diagnosis Date  . Depression   . Anxiety   . Carpal tunnel syndrome, bilateral   . GERD (gastroesophageal reflux disease)   . Neuropathy (HCC)   . Hypertension   . Asthma   . PUD (peptic ulcer disease)   . Bipolar disorder (HCC)   . Fx. left wrist   . Arthritis     lower back  . Muscle spasms of neck    Past Surgical History  Procedure Laterality Date  . Rotator cuff repair Right 2011  . Hammer toe fusion Bilateral 2008  . Carpal tunnel release Right 2003  . Cholecystectomy N/A 02/09/2014    Procedure: LAPAROSCOPIC CHOLECYSTECTOMY WITH INTRAOPERATIVE CHOLANGIOGRAM;  Surgeon: Valarie Merino, MD;  Location: [facility];  Service: General;  Laterality: N/A;   Family History  Problem Relation Age of Onset  . Cancer Mother     pancreatic  . Heart failure Father   . Diabetes Father   . Cancer Paternal Grandmother   . HIV/AIDS Brother    Social History  Substance Use Topics  . Smoking status: Never Smoker   . Smokeless tobacco: Never Used  . Alcohol Use: No   OB History    No data available      Review of Systems  Constitutional: Negative for fever.  Respiratory: Negative for apnea and shortness of breath.   Skin:       3 abscess at the left axillary region     Allergies  Penicillins  Home Medications   Prior to Admission medications   Medication Sig Start Date End Date Taking? Authorizing Provider  atenolol (TENORMIN) 50 MG tablet TAKE 1 TABLET BY MOUTH 2 TIMES DAILY 11/27/15   Jaclyn Shaggy, MD  cetirizine (ZYRTEC) 10 MG tablet Take 1 tablet (10 mg total) by mouth daily. 11/19/15   Jaclyn Shaggy, MD  clindamycin (CLEOCIN) 300 MG capsule Take 1 capsule (300 mg total) by mouth 2 (two) times daily. 11/19/15   Jaclyn Shaggy, MD  cloNIDine (CATAPRES) 0.1 MG tablet Take 1 tablet (0.1 mg total) by mouth at bedtime. 08/06/15   Jaclyn Shaggy, MD  cyclobenzaprine (FLEXERIL) 10 MG tablet TAKE 1 TABLET BY MOUTH TWICE DAILY. 12/09/15   Jaclyn Shaggy, MD  divalproex (DEPAKOTE) 500 MG DR tablet Take 500 mg by mouth 3 (three) times daily.    Historical Provider, MD  gabapentin (NEURONTIN) 300 MG capsule Take 2 capsules (600 mg total) by mouth 3 (three) times daily. 10/23/15   Jaclyn Shaggy, MD  hydrOXYzine (ATARAX/VISTARIL) 50 MG tablet Take 1  tablet (50 mg total) by mouth 3 (three) times daily as needed for anxiety or itching. 10/23/15   Jaclyn ShaggyEnobong Amao, MD  ibuprofen (ADVIL,MOTRIN) 800 MG tablet Take 1 tablet (800 mg total) by mouth every 8 (eight) hours as needed for mild pain. 11/21/15   Kristen N Ward, DO  lisinopril-hydrochlorothiazide (PRINZIDE,ZESTORETIC) 20-25 MG tablet Take 1 tablet by mouth daily. 10/23/15   Jaclyn ShaggyEnobong Amao, MD  mirtazapine (REMERON) 30 MG tablet Take 45 mg by mouth at bedtime.    Historical Provider, MD  olopatadine (PATANOL) 0.1 % ophthalmic solution Place 1 drop into both eyes 2 (two) times daily. 11/19/15   Jaclyn ShaggyEnobong Amao, MD  omeprazole (PRILOSEC) 40 MG capsule Take 1 capsule (40 mg total) by mouth daily. 10/23/15   Jaclyn ShaggyEnobong Amao, MD  oxyCODONE-acetaminophen (PERCOCET/ROXICET) 5-325 MG  tablet Take 1-2 tablets by mouth every 4 (four) hours as needed. 12/03/15   Elson AreasLeslie K Sofia, PA-C  sucralfate (CARAFATE) 1 GM/10ML suspension Take 10 mLs (1 g total) by mouth 4 (four) times daily -  with meals and at bedtime. 12/06/15   April Palumbo, MD  topiramate (TOPAMAX) 50 MG tablet Take 1 tablet (50 mg total) by mouth 2 (two) times daily. Patient 08/06/15   Jaclyn ShaggyEnobong Amao, MD  traZODone (DESYREL) 300 MG tablet Take 1 tablet (300 mg total) by mouth at bedtime. 11/21/14   Adonis BrookSheila Agustin, NP  VENTOLIN HFA 108 (90 Base) MCG/ACT inhaler INHALE 1-2 PUFFS INTO THE LUNGS EVERY 6 HOURS AS NEEDED FOR WHEEZING OR SHORTNESS OF BREATH. 12/02/15   Jaclyn ShaggyEnobong Amao, MD   BP 147/86 mmHg  Pulse 91  Temp(Src) 97.5 F (36.4 C) (Oral)  Resp 18  SpO2 98%  LMP 11/04/2015 (Approximate) Physical Exam  Constitutional: She is oriented to person, place, and time. She appears well-developed and well-nourished. No distress.  HENT:  Head: Normocephalic and atraumatic.  Eyes: Conjunctivae and EOM are normal.  Neck: Neck supple. No tracheal deviation present.  Cardiovascular: Normal rate.   Pulmonary/Chest: Effort normal. No respiratory distress.  Musculoskeletal: Normal range of motion.  Neurological: She is alert and oriented to person, place, and time.  Skin: Skin is warm and dry.  Left axillary area: 3 small subcutaneous nodules can be palpated without surrounding erythema or palpable fluctuance.  Tenderness noted on exam.   Psychiatric: She has a normal mood and affect. Her behavior is normal.  Nursing note and vitals reviewed.   ED Course  Procedures (including critical care time) DIAGNOSTIC STUDIES: Oxygen Saturation is 98% on RA,  normal by my interpretation.    COORDINATION OF CARE: 6:46 PM Discussed treatment plan which includes abx and pain medication with pt at bedside and pt agreed to plan.   MDM   Final diagnoses:  Cutaneous abscess of left axilla    BP 147/86 mmHg  Pulse 91  Temp(Src) 97.5  F (36.4 C) (Oral)  Resp 18  SpO2 98%  LMP 11/04/2015 (Approximate)   I personally performed the services described in this documentation, which was scribed in my presence. The recorded information has been reviewed and is accurate.   6:52 PM Early forming abscesses in L axillary region not amenable for I&D yet.  Warm compress along with abx as treatment.  Return precaution discussed .   Fayrene HelperBowie Kristalynn Coddington, PA-C 12/15/15 Carlis [id number]  Loren Raceravid Yelverton, MD 12/23/15 2220

## 2015-12-15 NOTE — ED Notes (Signed)
Pt states that 2 days ago she started having 3 boils under her L axilla. Hx of same. Tried warm compresses w/o help. Alert and oriented.

## 2015-12-15 NOTE — Discharge Instructions (Signed)
Continue to apply warm compress regularly to armpit.  Take antibiotic as prescribed.  Return in 2 days if you notice no improvement.  Abscess An abscess is an infected area that contains a collection of pus and debris.It can occur in almost any part of the body. An abscess is also known as a furuncle or boil. CAUSES  An abscess occurs when tissue gets infected. This can occur from blockage of oil or sweat glands, infection of hair follicles, or a minor injury to the skin. As the body tries to fight the infection, pus collects in the area and creates pressure under the skin. This pressure causes pain. People with weakened immune systems have difficulty fighting infections and get certain abscesses more often.  SYMPTOMS Usually an abscess develops on the skin and becomes a painful mass that is red, warm, and tender. If the abscess forms under the skin, you may feel a moveable soft area under the skin. Some abscesses break open (rupture) on their own, but most will continue to get worse without care. The infection can spread deeper into the body and eventually into the bloodstream, causing you to feel ill.  DIAGNOSIS  Your caregiver will take your medical history and perform a physical exam. A sample of fluid may also be taken from the abscess to determine what is causing your infection. TREATMENT  Your caregiver may prescribe antibiotic medicines to fight the infection. However, taking antibiotics alone usually does not cure an abscess. Your caregiver may need to make a small cut (incision) in the abscess to drain the pus. In some cases, gauze is packed into the abscess to reduce pain and to continue draining the area. HOME CARE INSTRUCTIONS   Only take over-the-counter or prescription medicines for pain, discomfort, or fever as directed by your caregiver.  If you were prescribed antibiotics, take them as directed. Finish them even if you start to feel better.  If gauze is used, follow your  caregiver's directions for changing the gauze.  To avoid spreading the infection:  Keep your draining abscess covered with a bandage.  Wash your hands well.  Do not share personal care items, towels, or whirlpools with others.  Avoid skin contact with others.  Keep your skin and clothes clean around the abscess.  Keep all follow-up appointments as directed by your caregiver. SEEK MEDICAL CARE IF:   You have increased pain, swelling, redness, fluid drainage, or bleeding.  You have muscle aches, chills, or a general ill feeling.  You have a fever. MAKE SURE YOU:   Understand these instructions.  Will watch your condition.  Will get help right away if you are not doing well or get worse.   This information is not intended to replace advice given to you by your health care provider. Make sure you discuss any questions you have with your health care provider.   Document Released: 05/20/2005 Document Revised: 02/09/2012 Document Reviewed: 10/23/2011 Elsevier Interactive Patient Education [employer]2016 Elsevier Inc.

## 2015-12-15 NOTE — ED Notes (Signed)
PT DISCHARGED. INSTRUCTIONS AND PRESCRIPTIONS GIVEN. AAOX3. PT IN NO APPARENT DISTRESS. THE OPPORTUNITY TO ASK QUESTIONS WAS PROVIDED. 

## 2015-12-26 MED FILL — VENTOLIN HFA 90 MCG INHALER: 108 (90 BAS | 25 days supply | Qty: 18 | Fill #1

## 2016-01-01 ENCOUNTER — Telehealth: Payer: Self-pay | Admitting: Diagnostic Neuroimaging

## 2016-01-01 MED FILL — ?CETIRIZINE HCL 10 MG TABLE: 10 | 30 days supply | Qty: 30 | Fill #2

## 2016-01-01 MED FILL — ATENOLOL 50 MG TABLET: 50 | 30 days supply | Qty: 60 | Fill #2

## 2016-01-01 MED FILL — ?OLOPATADINE HCL 0.1% EYE D: 0.1% | 30 days supply | Qty: 5 | Fill #2

## 2016-01-01 MED FILL — cloNIDine HCL 0.1 MG TABS: 0.1 | 30 days supply | Qty: 30 | Fill #5

## 2016-01-01 NOTE — Telephone Encounter (Signed)
Patient called to check status of Medical Records to have been sent to [employer].

## 2016-01-02 ENCOUNTER — Ambulatory Visit: Payer: Self-pay | Attending: Family Medicine | Admitting: Family Medicine

## 2016-01-02 ENCOUNTER — Encounter: Payer: Self-pay | Admitting: Family Medicine

## 2016-01-02 VITALS — BP 111/71 | HR 76 | Temp 98.3°F | Resp 16 | Ht 68.0 in | Wt 193.2 lb

## 2016-01-02 DIAGNOSIS — R109 Unspecified abdominal pain: Secondary | ICD-10-CM | POA: Insufficient documentation

## 2016-01-02 DIAGNOSIS — Z79899 Other long term (current) drug therapy: Secondary | ICD-10-CM | POA: Insufficient documentation

## 2016-01-02 DIAGNOSIS — K279 Peptic ulcer, site unspecified, unspecified as acute or chronic, without hemorrhage or perforation: Secondary | ICD-10-CM

## 2016-01-02 DIAGNOSIS — B351 Tinea unguium: Secondary | ICD-10-CM

## 2016-01-02 MED ORDER — SUCRALFATE 1 GM/10ML PO SUSP: 1.0000 g | Freq: Three times a day (TID) | ORAL | Status: DC

## 2016-01-02 MED FILL — CARAFATE 1 GM/10 ML SUSP: 1 | 10 days supply | Qty: 420 | Fill #0

## 2016-01-02 NOTE — Progress Notes (Signed)
Patient's here for stomach pain. Patient was seen in ED.  Patient rate pain 7/10, described as off and on burning and painful.  Back pain and blister on her toe.

## 2016-01-02 NOTE — Progress Notes (Signed)
Subjective:  Patient ID: Amanda Amanda Hinkle, female    DOB: February 01, 1966  Age: 50 y.o. MRN: [id number]  CC: Abdominal Pain   HPI Amanda Amanda Amanda with Amanda history of hypertension, bipolar disorder, neuropathy, chronic low back pain, GERD, carpal tunnel syndrome who comes in Complaining of burning epigastric pain which has not been controlled on omeprazole.  She does have Amanda history of peptic ulcer disease and informed me she usually has to have her "esophagus stretched" periodically. She was previously taking Carafate which she has run out off. She would like to be referred to podiatrist because she was informed she would need removal of her toenails due to her onychomycosis and is beginning to have pain on the left fourth toe. Denies wearing tight fitting shoes.  Outpatient Prescriptions Prior to Visit  Medication Sig Dispense Refill  . cetirizine (ZYRTEC) 10 MG tablet Take 1 tablet (10 mg total) by mouth daily. 30 tablet 2  . cloNIDine (CATAPRES) 0.1 MG tablet Take 1 tablet (0.1 mg total) by mouth at bedtime. 60 tablet 2  . cyclobenzaprine (FLEXERIL) 10 MG tablet TAKE 1 TABLET BY MOUTH TWICE DAILY. 60 tablet 2  . divalproex (DEPAKOTE) 500 MG DR tablet Take 500 mg by mouth 3 (three) times daily.    Marland Kitchen. gabapentin (NEURONTIN) 300 MG capsule Take 2 capsules (600 mg total) by mouth 3 (three) times daily. 120 capsule 2  . hydrOXYzine (ATARAX/VISTARIL) 50 MG tablet Take 1 tablet (50 mg total) by mouth 3 (three) times daily as needed for anxiety or itching. 90 tablet 2  . lisinopril-hydrochlorothiazide (PRINZIDE,ZESTORETIC) 20-25 MG tablet Take 1 tablet by mouth daily. 30 tablet 2  . mirtazapine (REMERON) 30 MG tablet Take 45 mg by mouth at bedtime.    Marland Kitchen. olopatadine (PATANOL) 0.1 % ophthalmic solution Place 1 drop into both eyes 2 (two) times daily. 5 mL 2  . omeprazole (PRILOSEC) 40 MG capsule Take 1 capsule (40 mg total) by mouth daily. 30 capsule 2  . topiramate (TOPAMAX) 50 MG tablet Take 1 tablet (50  mg total) by mouth 2 (two) times daily. Patient 60 tablet 2  . traZODone (DESYREL) 300 MG tablet Take 1 tablet (300 mg total) by mouth at bedtime. 30 tablet 0  . VENTOLIN HFA 108 (90 Base) MCG/ACT inhaler INHALE 1-2 PUFFS INTO THE LUNGS EVERY 6 HOURS AS NEEDED FOR WHEEZING OR SHORTNESS OF BREATH. 18 g 3  . atenolol (TENORMIN) 50 MG tablet TAKE 1 TABLET BY MOUTH 2 TIMES DAILY 60 tablet 2  . clindamycin (CLEOCIN) 150 MG capsule Take 2 capsules (300 mg total) by mouth 3 (three) times daily. (Patient not taking: Reported on 01/02/2016) 28 capsule 0  . HYDROcodone-acetaminophen (NORCO/VICODIN) 5-325 MG tablet Take 1 tablet by mouth every 6 (six) hours as needed for moderate pain. (Patient not taking: Reported on 01/02/2016) 6 tablet 0  . ibuprofen (ADVIL,MOTRIN) 800 MG tablet Take 1 tablet (800 mg total) by mouth every 8 (eight) hours as needed for mild pain. (Patient not taking: Reported on 01/02/2016) 30 tablet 0  . sucralfate (CARAFATE) 1 GM/10ML suspension Take 10 mLs (1 g total) by mouth 4 (four) times daily -  with meals and at bedtime. (Patient not taking: Reported on 01/02/2016) 420 mL 0   No facility-administered medications prior to visit.    ROS Review of Systems  Constitutional: Negative for activity change and appetite change.  HENT: Negative for sinus pressure and sore throat.   Respiratory: Negative for chest tightness, shortness of  breath and wheezing.   Cardiovascular: Negative for chest pain and palpitations.  Gastrointestinal: Positive for abdominal pain. Negative for constipation and abdominal distention.  Genitourinary: Negative.   Musculoskeletal:       Toe pain  Skin:       Nail discoloration  Psychiatric/Behavioral: Negative for behavioral problems and dysphoric mood.    Objective:  BP 111/71 mmHg  Pulse 76  Temp(Src) 98.3 F (36.8 C) (Oral)  Resp 16  Ht  (1.727 m)  Wt 193 lb 3.2 oz (87.635 kg)  BMI 29.38 kg/m2  SpO2 99%  LMP 12/10/2015  BP/Weight 01/02/2016  12/15/2015 12/06/2015  Systolic BP 111 147 99  Diastolic BP 71 86 72  Wt. (Lbs) 193.2 - -  BMI 29.38 - -      Physical Exam  Constitutional: She is oriented to person, place, and time. She appears well-developed and well-nourished.  Cardiovascular: Normal rate, normal heart sounds and intact distal pulses.   No murmur heard. Pulmonary/Chest: Effort normal and breath sounds normal. She has no wheezes. She has no rales. She exhibits no tenderness.  Abdominal: Soft. Bowel sounds are normal. She exhibits no distension and no mass. There is no tenderness.  Musculoskeletal: Normal range of motion.  Neurological: She is alert and oriented to person, place, and time.  Skin:  Onychomycosis of toenails, little callus formation on fourth left toe     Assessment & Plan:   1. PUD (peptic ulcer disease) Continue omeprazole If symptoms persist I will refer to GI for upper endoscopy as patient reports that she has to frequently have her esophageal dilatation - sucralfate (CARAFATE) 1 GM/10ML suspension; Take 10 mLs (1 g total) by mouth 4 (four) times daily -  with meals and at bedtime.  Dispense: 420 mL; Refill: 1  2. Onychomycosis Refer to podiatrist as patient states she was supposed to have toenails taken off Advised to wear loosefitting shoes as tight shoes might contribute to symptoms in her toes.   Meds ordered this encounter  Medications  . sucralfate (CARAFATE) 1 GM/10ML suspension    Sig: Take 10 mLs (1 g total) by mouth 4 (four) times daily -  with meals and at bedtime.    Dispense:  420 mL    Refill:  1    Follow-up: Return in about 6 weeks (around 02/13/2016) for Follow-up on peptic ulcer disease; please schedule with podiatrist for onychomycosis of toenails.   Jaclyn Shaggy MD

## 2016-01-03 ENCOUNTER — Ambulatory Visit: Payer: Self-pay | Admitting: Family Medicine

## 2016-01-03 MED FILL — HYDROXYZINE PAM 50 MG CAP: 50 | 30 days supply | Qty: 90 | Fill #0

## 2016-01-03 MED FILL — HALOPERIDOL 5 MG TABLET: 5 | 30 days supply | Qty: 30 | Fill #0

## 2016-01-03 MED FILL — MIRTAZAPINE 30 MG TABLET: 30 | 30 days supply | Qty: 30 | Fill #0

## 2016-01-06 MED FILL — traZODone HCL 150 MG TABS: 150 | 30 days supply | Qty: 60 | Fill #0

## 2016-01-06 MED FILL — GABAPENTIN 800 MG TABLET: 800 | 30 days supply | Qty: 120 | Fill #0

## 2016-01-09 MED FILL — hydrOXYzine HCL 50 MG TABS: 50 | 30 days supply | Qty: 90 | Fill #2

## 2016-01-09 MED FILL — ?CYCLOBENZAPRINE 10 MG TABL: 10 | 30 days supply | Qty: 60 | Fill #1

## 2016-01-13 ENCOUNTER — Other Ambulatory Visit: Payer: Self-pay | Admitting: Family Medicine

## 2016-01-13 MED FILL — OMEPRAZOLE DR 40 MG CAPSULE: 40 | 30 days supply | Qty: 30 | Fill #0

## 2016-01-13 MED FILL — LISINOPRIL-HCTZ 20-25 MG TA: 20-25 | 30 days supply | Qty: 30 | Fill #0

## 2016-01-14 NOTE — Telephone Encounter (Signed)
Patient asks that we call [phone] if unable to reach at 1st number. "States it's been a month and a half since she 1st requested records".

## 2016-01-14 NOTE — Telephone Encounter (Signed)
Patient called regarding medical records, states lawyer Jacquenette ShoneJulian for [employer] is trying to get her records and states she can come to our office to pick up the records and was advised there is no charge. Please call patient to advise status of medical records.

## 2016-01-14 NOTE — Telephone Encounter (Signed)
Pt records are ready for p/u.

## 2016-01-22 ENCOUNTER — Ambulatory Visit: Payer: No Typology Code available for payment source | Admitting: Podiatry

## 2016-01-22 ENCOUNTER — Encounter: Payer: Self-pay | Admitting: Podiatry

## 2016-01-22 VITALS — BP 99/64 | HR 98 | Resp 18

## 2016-01-22 DIAGNOSIS — L603 Nail dystrophy: Secondary | ICD-10-CM

## 2016-01-22 DIAGNOSIS — M79674 Pain in right toe(s): Secondary | ICD-10-CM

## 2016-01-22 DIAGNOSIS — M79675 Pain in left toe(s): Secondary | ICD-10-CM

## 2016-01-22 NOTE — Patient Instructions (Signed)

## 2016-01-22 NOTE — Progress Notes (Signed)
   Subjective:    Patient ID: Amanda [name], female    DOB: 02-01-1966, 50 y.o.   MRN: [id number]  HPI  50 year old female presents the office today with pain to both of her big toenails which are very thick. She has tried trimming as well as over-the-counter treatment without any relief from this time she is requesting for both of her big toenails removed permanently. She states they're painful with pressure and was shoes on a daily basis. No other complaints at this time.   Review of Systems  All other systems reviewed and are negative.      Objective:   Physical Exam General: AAO x3, NAD  Dermatological: Bilateral hallux nails are tender to palpation they are hypertrophic, dystrophic, brittle, discolored. The remaining nails are somewhat dystrophic, discolored as well. There is no tenderness of the lesser digit toenails. Has really redness or drainage. No open lesions are identified. There is scars from prior surgery from bunions.  Vascular: Dorsalis Pedis artery and Posterior Tibial artery pedal pulses are 2/4 bilateral with immedate capillary fill time. Pedal hair growth present. There is no pain with calf compression, swelling, warmth, erythema.   Neruologic: Grossly intact via light touch bilateral. Vibratory intact via tuning fork bilateral. Protective threshold with Semmes Wienstein monofilament intact to all pedal sites bilateral. Patellar and Achilles deep tendon reflexes 2+ bilateral. No Babinski or clonus noted bilateral.   Musculoskeletal: Hardware is palpable from prior bunion surgeries. No pain, crepitus, or limitation noted with foot and ankle range of motion bilateral. Muscular strength 5/5 in all groups tested bilateral.  Gait: Unassisted, Nonantalgic.      Assessment & Plan:  Bilateral hallux symptomatically onychodystrophy -Treatment options discussed including all alternatives, risks, and complications -Etiology of symptoms were discussed -At this time, the  patient is requesting total nail removal with chemical matricectomy to the symptomatic portion of the nail. Risks and complications were discussed with the patient for which they understand and  verbally consent to the procedure. Under sterile conditions a total of 3 mL of a mixture of 2% lidocaine plain and 0.5% Marcaine plain was infiltrated in a hallux block fashion. Once anesthetized, the skin was prepped in sterile fashion. A tourniquet was then applied. Next the left and right hallux nails were removed making sure to remove the entire offending nail borders. Once the nails were ensured to be removed area was debrided and the underlying skin was intact. There is no purulence identified in the procedure. Next phenol was then applied under standard conditions and copiously irrigated. Silvadene was applied. A dry sterile dressing was applied. After application of the dressing the tourniquet was removed and there is found to be an immediate capillary refill time to the digit. The patient tolerated the procedure well any complications. Post procedure instructions were discussed the patient for which he verbally understood. Follow-up in one week for nail check or sooner if any problems are to arise. Discussed signs/symptoms of infection and directed to call the office immediately should any occur or go directly to the emergency room. In the meantime, encouraged to call the office with any questions, concerns, changes symptoms.  Ovid CurdMatthew Wagoner, DPM

## 2016-01-24 ENCOUNTER — Ambulatory Visit: Payer: Self-pay

## 2016-01-24 ENCOUNTER — Encounter: Payer: Self-pay | Admitting: Family Medicine

## 2016-01-24 ENCOUNTER — Ambulatory Visit: Payer: No Typology Code available for payment source | Attending: Family Medicine | Admitting: Family Medicine

## 2016-01-24 VITALS — BP 116/81 | HR 98 | Temp 98.5°F | Ht 68.0 in | Wt 196.0 lb

## 2016-01-24 DIAGNOSIS — G629 Polyneuropathy, unspecified: Secondary | ICD-10-CM

## 2016-01-24 DIAGNOSIS — K279 Peptic ulcer, site unspecified, unspecified as acute or chronic, without hemorrhage or perforation: Secondary | ICD-10-CM

## 2016-01-24 DIAGNOSIS — I10 Essential (primary) hypertension: Secondary | ICD-10-CM

## 2016-01-24 MED ORDER — TOPIRAMATE 50 MG PO TABS: 50.0000 mg | ORAL_TABLET | Freq: Two times a day (BID) | ORAL | Status: DC

## 2016-01-24 MED ORDER — CETIRIZINE HCL 10 MG PO TABS: 10.0000 mg | ORAL_TABLET | Freq: Every day | ORAL | Status: DC

## 2016-01-24 MED ORDER — CLONIDINE HCL 0.1 MG PO TABS: 0.1000 mg | ORAL_TABLET | Freq: Every day | ORAL | Status: DC

## 2016-01-24 MED ORDER — ATENOLOL 50 MG PO TABS: 50.0000 mg | ORAL_TABLET | Freq: Two times a day (BID) | ORAL | Status: DC

## 2016-01-24 MED FILL — TOPIRAMATE 50 MG TABLET: 50 | 30 days supply | Qty: 60 | Fill #0

## 2016-01-24 MED FILL — ?CETIRIZINE HCL 10 MG TABLE: 10 | 30 days supply | Qty: 30 | Fill #0

## 2016-01-24 MED FILL — ?ATENOLOL 50 MG TABLET: 50 | 30 days supply | Qty: 60 | Fill #0

## 2016-01-24 MED FILL — ?CLONIDINE HCL 0.1 MG TABL: 0.1 | 30 days supply | Qty: 30 | Fill #0

## 2016-01-24 NOTE — Progress Notes (Signed)
Subjective:  Patient ID: Amanda [name], female    DOB: Mar 23, 1966  Age: 50 y.o. MRN: [id number]  CC: stomach pain   HPI Amanda [name] is a 50 year old female with a history of hypertension, bipolar disorder, neuropathy, chronic low back pain, GERD, carpal tunnel syndrome who comes in Complaining of burning epigastric pain which has not been controlled on omeprazole and Carafate. She does have a history of peptic ulcer disease and informed me she usually has to have her "esophagus stretched" periodically.   She was recently seen by podiatrist and had excision of some calluses on her feet. Request refills of some of her medications.  Outpatient Prescriptions Prior to Visit  Medication Sig Dispense Refill  . cyclobenzaprine (FLEXERIL) 10 MG tablet TAKE 1 TABLET BY MOUTH TWICE DAILY. 60 tablet 2  . divalproex (DEPAKOTE) 500 MG DR tablet Take 500 mg by mouth 3 (three) times daily.    Marland Kitchen gabapentin (NEURONTIN) 300 MG capsule Take 2 capsules (600 mg total) by mouth 3 (three) times daily. 120 capsule 2  . hydrOXYzine (ATARAX/VISTARIL) 50 MG tablet Take 1 tablet (50 mg total) by mouth 3 (three) times daily as needed for anxiety or itching. 90 tablet 2  . lisinopril-hydrochlorothiazide (PRINZIDE,ZESTORETIC) 20-25 MG tablet TAKE 1 TABLET BY MOUTH DAILY. 30 tablet 2  . mirtazapine (REMERON) 30 MG tablet Take 45 mg by mouth at bedtime.    Marland Kitchen olopatadine (PATANOL) 0.1 % ophthalmic solution Place 1 drop into both eyes 2 (two) times daily. 5 mL 2  . omeprazole (PRILOSEC) 40 MG capsule TAKE 1 CAPSULE BY MOUTH DAILY. 30 capsule 2  . sucralfate (CARAFATE) 1 GM/10ML suspension Take 10 mLs (1 g total) by mouth 4 (four) times daily -  with meals and at bedtime. 420 mL 1  . traZODone (DESYREL) 300 MG tablet Take 1 tablet (300 mg total) by mouth at bedtime. 30 tablet 0  . atenolol (TENORMIN) 50 MG tablet TAKE 1 TABLET BY MOUTH 2 TIMES DAILY 60 tablet 2  . cetirizine (ZYRTEC) 10 MG tablet Take 1 tablet (10 mg  total) by mouth daily. 30 tablet 2  . cloNIDine (CATAPRES) 0.1 MG tablet Take 1 tablet (0.1 mg total) by mouth at bedtime. 60 tablet 2  . topiramate (TOPAMAX) 50 MG tablet Take 1 tablet (50 mg total) by mouth 2 (two) times daily. Patient 60 tablet 2  . clindamycin (CLEOCIN) 150 MG capsule Take 2 capsules (300 mg total) by mouth 3 (three) times daily. (Patient not taking: Reported on 01/02/2016) 28 capsule 0  . HYDROcodone-acetaminophen (NORCO/VICODIN) 5-325 MG tablet Take 1 tablet by mouth every 6 (six) hours as needed for moderate pain. (Patient not taking: Reported on 01/02/2016) 6 tablet 0  . ibuprofen (ADVIL,MOTRIN) 800 MG tablet Take 1 tablet (800 mg total) by mouth every 8 (eight) hours as needed for mild pain. (Patient not taking: Reported on 01/02/2016) 30 tablet 0  . VENTOLIN HFA 108 (90 Base) MCG/ACT inhaler INHALE 1-2 PUFFS INTO THE LUNGS EVERY 6 HOURS AS NEEDED FOR WHEEZING OR SHORTNESS OF BREATH. (Patient not taking: Reported on 01/24/2016) 18 g 3   No facility-administered medications prior to visit.    ROS Review of Systems  Constitutional: Negative for activity change and appetite change.  HENT: Negative for sinus pressure and sore throat.   Respiratory: Negative for chest tightness, shortness of breath and wheezing.   Cardiovascular: Negative for chest pain and palpitations.  Gastrointestinal: Positive for abdominal pain. Negative for constipation and  abdominal distention.  Genitourinary: Negative.   Musculoskeletal: Negative.   Psychiatric/Behavioral: Negative for behavioral problems and dysphoric mood.    Objective:  BP 116/81 mmHg  Pulse 98  Temp(Src) 98.5 F (36.9 C)  Ht 5\' 8"  (1.727 m)  Wt 196 lb (88.905 kg)  BMI 29.81 kg/m2  LMP 12/10/2015  BP/Weight 01/24/2016 01/22/2016 01/02/2016  Systolic BP 116 99 111  Diastolic BP 81 64 71  Wt. (Lbs) 196 - 193.2  BMI 29.81 - 29.38      Physical Exam  Constitutional: She is oriented to person, place, and time. She appears  well-developed and well-nourished.  Cardiovascular: Normal rate, normal heart sounds and intact distal pulses.   No murmur heard. Pulmonary/Chest: Effort normal and breath sounds normal. She has no wheezes. She has no rales. She exhibits no tenderness.  Abdominal: Soft. Bowel sounds are normal. She exhibits no distension and no mass. There is tenderness (epigastric tenderness).  Musculoskeletal: Normal range of motion.  Neurological: She is alert and oriented to person, place, and time.     Assessment & Plan:   1. Essential hypertension Controlled - cloNIDine (CATAPRES) 0.1 MG tablet; Take 1 tablet (0.1 mg total) by mouth at bedtime.  Dispense: 60 tablet; Refill: 2 - atenolol (TENORMIN) 50 MG tablet; Take 1 tablet (50 mg total) by mouth 2 (two) times daily.  Dispense: 60 tablet; Refill: 2  2. Neuropathy (HCC) Uncontrolled on Neurontin  3. PUD (peptic ulcer disease) Not responding to PPI - Ambulatory referral to Gastroenterology   Meds ordered this encounter  Medications  . cloNIDine (CATAPRES) 0.1 MG tablet    Sig: Take 1 tablet (0.1 mg total) by mouth at bedtime.    Dispense:  60 tablet    Refill:  2  . cetirizine (ZYRTEC) 10 MG tablet    Sig: Take 1 tablet (10 mg total) by mouth daily.    Dispense:  30 tablet    Refill:  2  . topiramate (TOPAMAX) 50 MG tablet    Sig: Take 1 tablet (50 mg total) by mouth 2 (two) times daily. Patient    Dispense:  60 tablet    Refill:  2  . atenolol (TENORMIN) 50 MG tablet    Sig: Take 1 tablet (50 mg total) by mouth 2 (two) times daily.    Dispense:  60 tablet    Refill:  2    Follow-up: Return in about 3 months (around 04/25/2016) for follow up of hypertension.   Jaclyn ShaggyEnobong Amao MD

## 2016-01-27 ENCOUNTER — Telehealth: Payer: Self-pay | Admitting: *Deleted

## 2016-01-27 ENCOUNTER — Telehealth: Payer: Self-pay | Admitting: Gastroenterology

## 2016-01-27 ENCOUNTER — Telehealth: Payer: Self-pay | Admitting: Family Medicine

## 2016-01-27 NOTE — Telephone Encounter (Addendum)
Pt states she had toenail procedures on Wednesday and is in excruciating pain.  I left message informing pt to continue the epsom salt soaks, and ir tolerates can take OTC Ibuprofen for the pain, and I would inform Dr. Ardelle AntonWagoner.  01/28/2016-Called Dr. Gabriel RungWagoner's orders to pt, with instructions to call for an appt if problem continues, and to [facility] [phone].

## 2016-01-27 NOTE — Telephone Encounter (Signed)
Can do tramdol 50mg  q12 hr prn pain #6. Would hold ibuprofen

## 2016-01-27 NOTE — Telephone Encounter (Signed)
Pt. Called stating that she had a procedure done on her toe nail and  She is in pain. Pt. Would like to be prescribed an Rx for for. Please f/u

## 2016-01-27 NOTE — Telephone Encounter (Signed)
Can have an afternoon or late morning appt with APP

## 2016-01-28 MED ORDER — TRAMADOL HCL 50 MG PO TABS: 50.0000 mg | ORAL_TABLET | Freq: Three times a day (TID) | ORAL | Status: DC | PRN

## 2016-01-29 ENCOUNTER — Telehealth: Payer: Self-pay

## 2016-01-29 ENCOUNTER — Ambulatory Visit (INDEPENDENT_AMBULATORY_CARE_PROVIDER_SITE_OTHER): Payer: Self-pay | Admitting: Physician Assistant

## 2016-01-29 ENCOUNTER — Other Ambulatory Visit (INDEPENDENT_AMBULATORY_CARE_PROVIDER_SITE_OTHER): Payer: Self-pay

## 2016-01-29 ENCOUNTER — Encounter: Payer: Self-pay | Admitting: Physician Assistant

## 2016-01-29 VITALS — BP 84/54 | HR 88 | Ht 66.75 in | Wt 193.5 lb

## 2016-01-29 DIAGNOSIS — R1319 Other dysphagia: Secondary | ICD-10-CM

## 2016-01-29 DIAGNOSIS — R1013 Epigastric pain: Secondary | ICD-10-CM

## 2016-01-29 DIAGNOSIS — Z8719 Personal history of other diseases of the digestive system: Secondary | ICD-10-CM

## 2016-01-29 LAB — CBC WITH DIFFERENTIAL/PLATELET
Basophils Absolute: 0 10*3/uL (ref 0.0–0.1)
Basophils Relative: 0.5 % (ref 0.0–3.0)
Eosinophils Absolute: 0.3 10*3/uL (ref 0.0–0.7)
Eosinophils Relative: 3.2 % (ref 0.0–5.0)
HCT: 38.6 % (ref 36.0–46.0)
Hemoglobin: 12.7 g/dL (ref 12.0–15.0)
Lymphocytes Relative: 33.9 % (ref 12.0–46.0)
Lymphs Abs: 3.4 10*3/uL (ref 0.7–4.0)
MCHC: 32.8 g/dL (ref 30.0–36.0)
MCV: 97 fl (ref 78.0–100.0)
Monocytes Absolute: 0.6 10*3/uL (ref 0.1–1.0)
Monocytes Relative: 5.8 % (ref 3.0–12.0)
Neutro Abs: 5.7 10*3/uL (ref 1.4–7.7)
Neutrophils Relative %: 56.6 % (ref 43.0–77.0)
Platelets: 231 10*3/uL (ref 150.0–400.0)
RBC: 3.98 Mil/uL (ref 3.87–5.11)
RDW: 14.5 % (ref 11.5–15.5)
WBC: 10 10*3/uL (ref 4.0–10.5)

## 2016-01-29 LAB — COMPREHENSIVE METABOLIC PANEL
ALT: 16 U/L (ref 0–35)
AST: 16 U/L (ref 0–37)
Albumin: 3.5 g/dL (ref 3.5–5.2)
Alkaline Phosphatase: 58 U/L (ref 39–117)
BUN: 11 mg/dL (ref 6–23)
CO2: 30 mEq/L (ref 19–32)
Calcium: 8.7 mg/dL (ref 8.4–10.5)
Chloride: 104 mEq/L (ref 96–112)
Creatinine, Ser: 1.04 mg/dL (ref 0.40–1.20)
GFR: 71.91 mL/min (ref >60.00)
Glucose, Bld: 89 mg/dL (ref 70–99)
Potassium: 4.2 mEq/L (ref 3.5–5.1)
Sodium: 140 mEq/L (ref 135–145)
Total Bilirubin: 0.3 mg/dL (ref 0.2–1.2)
Total Protein: 6 g/dL (ref 6.0–8.3)

## 2016-01-29 LAB — LIPASE: Lipase: 12 U/L (ref 11.0–59.0)

## 2016-01-29 MED ORDER — OMEPRAZOLE 40 MG PO CPDR: DELAYED_RELEASE_CAPSULE | ORAL | Status: DC

## 2016-01-29 NOTE — Progress Notes (Addendum)
Patient ID: Amanda [name], female   DOB: Jul 14, 1966, 50 y.o.   MRN: [id number]   Subjective:    Patient ID: Amanda [name], female    DOB: 01/04/1966, 50 y.o.   MRN: [id number]  HPI [name] is a 50 year old African-American female, new to GI today referred by Dr. [facility]ia [facility] for evaluation of complaints of abdominal pain and dysphagia. Patient has history of hypertension, I polar disorder, substance abuse, he is status post laparoscopic cholecystectomy in 2015. Records show she had been seen by Dr. Loreta Ave in 2015 and had undergone a barium swallow which was normal. Patient tells me that she had seen Dr. Loreta Ave in the past and had prior endoscopies with esophageal dilation. She states she chose to see another GI doctor at this time. She relates having history of ulcer disease and was told she had an ulcer several years ago when she was living in [place]. She's not sure how this diagnosis was made. He has ongoing reflux symptoms especially when lying down. He has also been having epigastric pain for the past 8-9 months sometimes worse with eating. She has developed recurrent dysphagia to solid foods with occasional episodes of regurgitation. She states she thinks she needs her esophagus stretched". Appetite has been fine and weight has been stable. She relates history of IBS and intermittent loose stools with no recent changes. Denies regular aspirin or NSAID use. She is currently on Prilosec 40 mg by mouth daily and was given a prescription for Carafate 1 g 4 times a day. She says these are not helping her pain.  Review of Systems Pertinent positive and negative review of systems were noted in the above HPI section.  All other review of systems was otherwise negative.  Outpatient Encounter Prescriptions as of 01/29/2016  Medication Sig  . atenolol (TENORMIN) 50 MG tablet Take 1 tablet (50 mg total) by mouth 2 (two) times daily.  . cetirizine (ZYRTEC) 10 MG tablet Take 1 tablet (10 mg  total) by mouth daily.  . cloNIDine (CATAPRES) 0.1 MG tablet Take 1 tablet (0.1 mg total) by mouth at bedtime.  . cyclobenzaprine (FLEXERIL) 10 MG tablet TAKE 1 TABLET BY MOUTH TWICE DAILY.  . divalproex (DEPAKOTE) 500 MG DR tablet Take 500 mg by mouth 3 (three) times daily.  Marland Kitchen gabapentin (NEURONTIN) 300 MG capsule Take 2 capsules (600 mg total) by mouth 3 (three) times daily.  . hydrOXYzine (ATARAX/VISTARIL) 50 MG tablet Take 1 tablet (50 mg total) by mouth 3 (three) times daily as needed for anxiety or itching.  Marland Kitchen lisinopril-hydrochlorothiazide (PRINZIDE,ZESTORETIC) 20-25 MG tablet TAKE 1 TABLET BY MOUTH DAILY.  . mirtazapine (REMERON) 30 MG tablet Take 45 mg by mouth at bedtime.  Marland Kitchen olopatadine (PATANOL) 0.1 % ophthalmic solution Place 1 drop into both eyes 2 (two) times daily.  Marland Kitchen omeprazole (PRILOSEC) 40 MG capsule Take 1 tab twice daily.  . sucralfate (CARAFATE) 1 GM/10ML suspension Take 10 mLs (1 g total) by mouth 4 (four) times daily -  with meals and at bedtime.  . topiramate (TOPAMAX) 50 MG tablet Take 1 tablet (50 mg total) by mouth 2 (two) times daily. Patient  . traZODone (DESYREL) 300 MG tablet Take 1 tablet (300 mg total) by mouth at bedtime.  . VENTOLIN HFA 108 (90 Base) MCG/ACT inhaler INHALE 1-2 PUFFS INTO THE LUNGS EVERY 6 HOURS AS NEEDED FOR WHEEZING OR SHORTNESS OF BREATH.  . [DISCONTINUED] omeprazole (PRILOSEC) 40 MG capsule TAKE 1 CAPSULE BY MOUTH DAILY.  . [  DISCONTINUED] clindamycin (CLEOCIN) 150 MG capsule Take 2 capsules (300 mg total) by mouth 3 (three) times daily. (Patient not taking: Reported on 01/02/2016)  . [DISCONTINUED] HYDROcodone-acetaminophen (NORCO/VICODIN) 5-325 MG tablet Take 1 tablet by mouth every 6 (six) hours as needed for moderate pain. (Patient not taking: Reported on 01/02/2016)  . [DISCONTINUED] ibuprofen (ADVIL,MOTRIN) 800 MG tablet Take 1 tablet (800 mg total) by mouth every 8 (eight) hours as needed for mild pain. (Patient not taking: Reported on  01/02/2016)  . [DISCONTINUED] traMADol (ULTRAM) 50 MG tablet Take 1 tablet (50 mg total) by mouth every 8 (eight) hours as needed.   No facility-administered encounter medications on file as of 01/29/2016.   Allergies  Allergen Reactions  . Penicillins Anaphylaxis    Has patient had a PCN reaction causing immediate rash, facial/tongue/throat swelling, SOB or lightheadedness with hypotension: Yes Has patient had a PCN reaction causing severe rash involving mucus membranes or skin necrosis: Yes Has patient had a PCN reaction that required hospitalization Yes Has patient had a PCN reaction occurring within the last 10 years: No-more than 10 years ago If all of the above answers are "NO", then may proceed with Cephalosporin use.   Jonne Ply [Aspirin]    Patient Active Problem List   Diagnosis Date Noted  . PUD (peptic ulcer disease) 01/02/2016  . Onychomycosis 01/02/2016  . Seasonal allergies 11/19/2015  . Wrist pain, left 10/23/2015  . GERD (gastroesophageal reflux disease) 08/06/2015  . Asthma 08/06/2015  . Back pain 08/06/2015  . Migraine 08/06/2015  . Bipolar disorder (HCC) 07/25/2015  . Neuropathy (HCC) 07/08/2015  . Major depressive disorder, recurrent, severe without psychotic features (HCC)   . Alcohol use disorder, severe, dependence (HCC) 11/16/2014  . Cocaine use disorder, severe, dependence (HCC) 11/16/2014  . Acute calculous cholecystitis s/p lap chole 02/09/2014 02/09/2014  . HTN (hypertension) 07/25/2013  . Pain in vertebral column 05/05/2013  . Carpal tunnel syndrome 11/18/2012  . Numbness and tingling of both legs below knees 11/18/2012  . Lumbago 11/18/2012  . Anxiety state, unspecified 11/18/2012   Social History   Social History  . Marital Status: Single    Spouse Name: N/A  . Number of Children: 0  . Years of Education: 12   Occupational History  . [name]   Social History Main Topics  . Smoking status: Never Smoker   . Smokeless tobacco: Never  Used  . Alcohol Use: No  . Drug Use: No     Comment: 07/24/15 patient states she has not used in 3 weeks  . Sexual Activity: No   Other Topics Concern  . Not on file   Social History Narrative   Pt lives at home alone. She has a HS education level and does not have any children.    She drinks 2 cups of caffeine daily.    Ms. Mol family history includes Breast cancer in her paternal grandmother; Diabetes in her father; HIV/AIDS in her brother; Heart failure in her father; Other in her sister; Pancreatic cancer in her mother.      Objective:    Filed Vitals:   01/29/16 1059  BP: 84/54  Pulse: 88    Physical Exam  well-developed African-American female in no acute distress, affect Flat blood pressure 84/54 pulse 88 height 5 foot 6 weight 193, BMI 30. HEENT; nontraumatic normocephalic EOMI PERRLA sclera anicteric, Cardiovascular; regular rate and rhythm with S1-S2 no murmur rub or gallop, Pulmonary; clear bilaterally, Abdomen ;soft she  is tender in the epigastrium there is no guarding or rebound no palpable mass or hepatosplenomegaly G has incisional ports scars from cholecystectomy bowel sounds present, Rectal ;exam not done,  Ext; no clubbing cyanosis or edema skin warm and dry, Neuropsych; appropriate affect Flat   Assessment & Plan:   #171 50 year old African-American female with several month history of epigastric pain. Rule out gastropathy or peptic ulcer disease rule out functional dyspepsia #2 history of chronic GERD #3 recurrent dysphagia-rule out esophageal stricture #4 status post laparoscopic cholecystectomy 2015 # 5 bipolar disorder # 6 history of substance abuse  Plan; Will obtain records from Dr. Loreta AveMann Increase Prilosec to 40 mg by mouth twice a day Finish her current prescription for Carafate Patient will be scheduled for EGD with possible esophageal dilation with Dr. Rhea BeltonPyrtle. Procedure discussed in detail with patient including risks benefits and she is  agreeable to proceed We'll check CBC with differential, CMET and lipase  Vicenta Olds S Raymond Bhardwaj PA-C 01/29/2016   Cc: Jaclyn ShaggyAmao, Enobong, MD  Addendum: Reviewed and agree with initial management. Beverley FiedlerJay M Pyrtle, MD

## 2016-01-29 NOTE — Telephone Encounter (Signed)
Writer called patient back and LVM for patient to return call to discuss toe pain.

## 2016-01-29 NOTE — Patient Instructions (Addendum)
Please go to the basement level to have your labs drawn.   Stop the HCTZ medication. Get an appointment with your primary care doctor.   [facility]

## 2016-01-30 ENCOUNTER — Telehealth: Payer: Self-pay | Admitting: *Deleted

## 2016-01-30 MED ORDER — TRAMADOL HCL 50 MG PO TABS: 50.0000 mg | ORAL_TABLET | Freq: Three times a day (TID) | ORAL | Status: DC | PRN

## 2016-01-30 NOTE — Telephone Encounter (Signed)
Pt called states she's been soaking in the epsom salt soak, but now has pus, and there was [facility]. I transferred pt to schedulers. I called the [facility] [phone].

## 2016-01-31 ENCOUNTER — Encounter: Payer: Self-pay | Admitting: Podiatry

## 2016-01-31 ENCOUNTER — Ambulatory Visit (INDEPENDENT_AMBULATORY_CARE_PROVIDER_SITE_OTHER): Payer: No Typology Code available for payment source | Admitting: Podiatry

## 2016-01-31 DIAGNOSIS — L603 Nail dystrophy: Secondary | ICD-10-CM

## 2016-01-31 DIAGNOSIS — L84 Corns and callosities: Secondary | ICD-10-CM

## 2016-01-31 DIAGNOSIS — Z9889 Other specified postprocedural states: Secondary | ICD-10-CM

## 2016-01-31 DIAGNOSIS — M79675 Pain in left toe(s): Secondary | ICD-10-CM

## 2016-01-31 DIAGNOSIS — M79674 Pain in right toe(s): Secondary | ICD-10-CM

## 2016-01-31 NOTE — Patient Instructions (Signed)

## 2016-01-31 NOTE — Progress Notes (Signed)
Patient ID: Amanda [name], female   DOB: 8/25/Amanda Fillers1967, 50 y.o.   MRN: [id number]  Subjective: Amanda [name] is a 50 y.o.  Female  returns to office today for follow up evaluation after having bilateral hallux total nail avulsions performed. She states that the area is tender with shoes. She just cut the prescription for tramadol yesterday. She denies any drainage or pus. No redness or red streaks. She has calluses to both of her feet which are painful. No drainage or pus. Patient denies fevers, chills, nausea, vomiting. Denies any calf pain, chest pain, SOB.   Objective:  Vitals: Reviewed  General: Well developed, nourished, in no acute distress, alert and oriented x3   Dermatology: Skin is warm, dry and supple bilateral. Bilateral hallux nail beds appears to be clean, dry, with mild granular tissue and surrounding scab. There is no surrounding erythema, edema, drainage/purulence. The remaining nails appear unremarkable at this time. Hyperkeratotic lesions bilateral 2. Upon debridement no underlying ulceration, drainage or other signs of infection. There are no other lesions or other signs of infection present.  Neurovascular status: Intact. No lower extremity swelling; No pain with calf compression bilateral.  Musculoskeletal: Decreased tenderness to palpation of the left and right hallux nail beds. Muscular strength within normal limits bilateral.   Assesement and Plan: S/p partial nail avulsion, doing well.   -Continue soaking in epsom salts twice a day followed by antibiotic ointment and a band-aid. Can leave uncovered at night. Continue this until completely healed.  -If the area has not healed in 2 weeks, call the office for follow-up appointment, or sooner if any problems arise.  -Hyperkeratotic lesions debrided 2 without complications or bleeding. -Monitor for any signs/symptoms of infection. Call the office immediately if any occur or go directly to the emergency room. Call with any  questions/concerns.  Ovid CurdMatthew Wagoner, DPM

## 2016-02-03 ENCOUNTER — Ambulatory Visit: Payer: Self-pay | Admitting: Podiatry

## 2016-02-03 NOTE — Telephone Encounter (Signed)
error 

## 2016-02-06 DIAGNOSIS — F1910 Other psychoactive substance abuse, uncomplicated: Secondary | ICD-10-CM

## 2016-02-06 DIAGNOSIS — K8020 Calculus of gallbladder without cholecystitis without obstruction: Secondary | ICD-10-CM

## 2016-02-06 DIAGNOSIS — F3170 Bipolar disorder, currently in remission, most recent episode unspecified: Secondary | ICD-10-CM

## 2016-02-06 DIAGNOSIS — I119 Hypertensive heart disease without heart failure: Secondary | ICD-10-CM

## 2016-02-06 DIAGNOSIS — I10 Essential (primary) hypertension: Secondary | ICD-10-CM

## 2016-02-06 NOTE — Congregational Nurse Program (Signed)
01/16/15 - [facility] pressured speech and anxiety, client concerned about safety and that someone on the streets stole her mother's candle, client is attempting to enter into PATH program through the [facility], spoke with Denyse Amassorey from case management who advised a GAIN screener to be done, GAIN screener total score was 9, client needs a team approach according to case management, client is anxious about housing and how to receive FMLA payments, Toney Rakesiffany Davis [facility].

## 2016-02-06 NOTE — Congregational Nurse Program (Signed)
Congregational Nurse Program Note  Date of Encounter: 02/06/2016  Past Medical History: Past Medical History  Diagnosis Date  . Depression   . Anxiety   . Carpal tunnel syndrome, bilateral   . GERD (gastroesophageal reflux disease)   . Neuropathy (HCC)   . Hypertension   . Asthma   . PUD (peptic ulcer disease)   . Bipolar disorder (HCC)   . Fx. left wrist   . Arthritis     lower back  . Muscle spasms of neck   . IBS (irritable bowel syndrome)     Encounter Details:     CNP Questionnaire - 01/16/16 1125    Patient Demographics   Is this a new or existing patient? Existing   Patient is considered a/an Not Applicable   Race African-American/Black   Patient Assistance   Location of Patient Assistance Not Applicable   Patient's financial/insurance status Self-Pay   Uninsured Patient Yes   Interventions Counseled to make appt. with provider;Averted from ED/Urgent Care   Patient referred to apply for the following financial assistance [employer] [employer] [employer]   Food insecurities addressed Provided food supplies   Transportation assistance No   Assistance securing medications No   Educational health offerings Not Applicable   Encounter Details   Primary purpose of visit Chronic Illness/Condition Visit   Was an Emergency Department visit averted? Not Applicable   Does patient have a medical provider? No   Patient referred to Not Applicable   Was a mental health screening completed? (GAINS tool) No   Does patient have dental issues? No   Does patient have vision issues? No   Does your patient have an abnormal blood pressure today? No   Since previous encounter, have you referred patient for abnormal blood pressure that resulted in a new diagnosis or medication change? No   Does your patient have an abnormal blood glucose today? No   Since previous encounter, have you referred patient for abnormal blood glucose that resulted in a new diagnosis or medication change? No   Was  there a life-saving intervention made? No      06/03/15 - 0900 - Called client back at [phone] ext. [phone], [place] [facility].  Client went to [facility] on 05/27/15 and was not suicidal according to client but client states, "I'm depressed." and [facility]. Referring to [facility], client stated, "I don't know why they brought me here. I was not suicidal."  At present client denies suicidal ideation or plan, As of April 25, 2015 client is on Latuda 60 mg QD and is taking Remeron and Vistaril, client states, "I feel ok on latuda and it is doing well for me."  Plan:  Communicated and collaborated plan to have her see MHRN and Lavinia SharpsMary Ann Placey, FNP when she is discharged, she is planned to be discharged to on 10/10.16.  FNP will follow up with her to be sure she has prescriptions for her mental health and [facility] and [facility], [facility] at [facility].

## 2016-02-11 MED FILL — OMEPRAZOLE DR 40 MG CAPSULE: 40 | 30 days supply | Qty: 30 | Fill #1

## 2016-02-13 ENCOUNTER — Ambulatory Visit: Payer: No Typology Code available for payment source | Attending: Family Medicine | Admitting: Family Medicine

## 2016-02-13 ENCOUNTER — Encounter: Payer: Self-pay | Admitting: Family Medicine

## 2016-02-13 VITALS — BP 124/83 | HR 107 | Temp 98.4°F | Resp 22 | Ht 68.0 in | Wt 196.6 lb

## 2016-02-13 DIAGNOSIS — K589 Irritable bowel syndrome without diarrhea: Secondary | ICD-10-CM | POA: Insufficient documentation

## 2016-02-13 DIAGNOSIS — Z880 Allergy status to penicillin: Secondary | ICD-10-CM | POA: Insufficient documentation

## 2016-02-13 DIAGNOSIS — Z9889 Other specified postprocedural states: Secondary | ICD-10-CM | POA: Insufficient documentation

## 2016-02-13 DIAGNOSIS — I10 Essential (primary) hypertension: Secondary | ICD-10-CM

## 2016-02-13 DIAGNOSIS — Z79899 Other long term (current) drug therapy: Secondary | ICD-10-CM | POA: Insufficient documentation

## 2016-02-13 DIAGNOSIS — K279 Peptic ulcer, site unspecified, unspecified as acute or chronic, without hemorrhage or perforation: Secondary | ICD-10-CM

## 2016-02-13 DIAGNOSIS — Z9049 Acquired absence of other specified parts of digestive tract: Secondary | ICD-10-CM | POA: Insufficient documentation

## 2016-02-13 MED ORDER — HYDROCHLOROTHIAZIDE 12.5 MG PO TABS: 12.5000 mg | ORAL_TABLET | Freq: Every day | ORAL | Status: DC

## 2016-02-13 MED FILL — traZODone HCL 150 MG TABS: 150 | 30 days supply | Qty: 60 | Fill #0

## 2016-02-13 MED FILL — GABAPENTIN 800 MG TABLET: 800 | 30 days supply | Qty: 90 | Fill #0

## 2016-02-13 MED FILL — VENTOLIN HFA 90 MCG INHALER: 108 (90 BAS | 25 days supply | Qty: 18 | Fill #2

## 2016-02-13 MED FILL — traMADol HCL 50 MG TABS: 50 | 1 days supply | Qty: 6 | Fill #0

## 2016-02-13 MED FILL — MIRTAZAPINE 30 MG TABLET: 30 | 30 days supply | Qty: 30 | Fill #0

## 2016-02-13 MED FILL — HYDROCHLOROTHIAZIDE 12.5 MG: 12.5 | 30 days supply | Qty: 30 | Fill #0

## 2016-02-13 MED FILL — HYDROXYZINE PAM 50 MG CAP: 50 | 30 days supply | Qty: 120 | Fill #0

## 2016-02-13 MED FILL — HALOPERIDOL 5 MG TABLET: 5 | 30 days supply | Qty: 30 | Fill #0

## 2016-02-13 MED FILL — DIVALPROEX SOD DR 500 MG TA: 500 | 30 days supply | Qty: 90 | Fill #0

## 2016-02-13 NOTE — Progress Notes (Signed)
Stomach pain Med refill

## 2016-02-13 NOTE — Progress Notes (Signed)
Subjective:  Patient ID: Amanda [name], female    DOB: [birth year]  Age: 50 y.o. MRN: [id number]  CC: low BP   HPI Amanda FillersDonna A [name] is a 50 year old female with a history of hypertension, bipolar disorder, neuropathy, chronic low back pain, PUD/GERD, carpal tunnel syndrome who comes in For a follow-up visit.  [facility] due to persisting abdominal pain and Protonix was increased from the daily dosing to twice daily dosing and she is scheduled for upper endoscopy with possible esophageal dilatation on 02/18/16. She continues to complain of burning epigastric pain despite compliance with her PPI and Carafate.  Her blood pressure was also in the hypotensive range at that visit: 84/54 and so her lisinopril/HCTZ was held and blood pressure is in the normal range today. She would still like to have HCTZ as you occasionally has pedal edema.  She has a prescription for tramadol at the pharmacy for toenail pain after procedures by podiatry which she is yet to pick up - still complains of some pain in her toes.    Past Medical History  Diagnosis Date  . Depression   . Anxiety   . Carpal tunnel syndrome, bilateral   . GERD (gastroesophageal reflux disease)   . Neuropathy (HCC)   . Hypertension   . Asthma   . PUD (peptic ulcer disease)   . Bipolar disorder (HCC)   . Fx. left wrist   . Arthritis     lower back  . Muscle spasms of neck   . IBS (irritable bowel syndrome)     Past Surgical History  Procedure Laterality Date  . Rotator cuff repair Right 2011  . Hammer toe fusion Bilateral 2008  . Carpal tunnel release Right 2003  . Cholecystectomy N/A 02/09/2014    Procedure: LAPAROSCOPIC CHOLECYSTECTOMY WITH INTRAOPERATIVE CHOLANGIOGRAM;  Surgeon: Valarie MerinoMatthew B Martin, MD;  Location: [facility];  Service: General;  Laterality: N/A;    Allergies  Allergen Reactions  . Penicillins Anaphylaxis    Has patient had a PCN reaction causing immediate rash,  facial/tongue/throat swelling, SOB or lightheadedness with hypotension: Yes Has patient had a PCN reaction causing severe rash involving mucus membranes or skin necrosis: Yes Has patient had a PCN reaction that required hospitalization Yes Has patient had a PCN reaction occurring within the last 10 years: No-more than 10 years ago If all of the above answers are "NO", then may proceed with Cephalosporin use.   Jonne Ply. Asa [Aspirin]      Outpatient Prescriptions Prior to Visit  Medication Sig Dispense Refill  . atenolol (TENORMIN) 50 MG tablet Take 1 tablet (50 mg total) by mouth 2 (two) times daily. 60 tablet 2  . cetirizine (ZYRTEC) 10 MG tablet Take 1 tablet (10 mg total) by mouth daily. 30 tablet 2  . cloNIDine (CATAPRES) 0.1 MG tablet Take 1 tablet (0.1 mg total) by mouth at bedtime. 60 tablet 2  . cyclobenzaprine (FLEXERIL) 10 MG tablet TAKE 1 TABLET BY MOUTH TWICE DAILY. 60 tablet 2  . divalproex (DEPAKOTE) 500 MG DR tablet Take 500 mg by mouth 3 (three) times daily.    Marland Kitchen. gabapentin (NEURONTIN) 300 MG capsule Take 2 capsules (600 mg total) by mouth 3 (three) times daily. 120 capsule 2  . hydrOXYzine (ATARAX/VISTARIL) 50 MG tablet Take 1 tablet (50 mg total) by mouth 3 (three) times daily as needed for anxiety or itching. 90 tablet 2  . mirtazapine (REMERON) 30 MG tablet Take 45  mg by mouth at bedtime.    Marland Kitchen olopatadine (PATANOL) 0.1 % ophthalmic solution Place 1 drop into both eyes 2 (two) times daily. 5 mL 2  . omeprazole (PRILOSEC) 40 MG capsule Take 1 tab twice daily. 60 capsule 2  . sucralfate (CARAFATE) 1 GM/10ML suspension Take 10 mLs (1 g total) by mouth 4 (four) times daily -  with meals and at bedtime. 420 mL 1  . topiramate (TOPAMAX) 50 MG tablet Take 1 tablet (50 mg total) by mouth 2 (two) times daily. Patient 60 tablet 2  . traMADol (ULTRAM) 50 MG tablet Take 1 tablet (50 mg total) by mouth every 8 (eight) hours as needed. 6 tablet 0  . traZODone (DESYREL) 300 MG tablet Take 1  tablet (300 mg total) by mouth at bedtime. 30 tablet 0  . VENTOLIN HFA 108 (90 Base) MCG/ACT inhaler INHALE 1-2 PUFFS INTO THE LUNGS EVERY 6 HOURS AS NEEDED FOR WHEEZING OR SHORTNESS OF BREATH. 18 g 3  . lisinopril-hydrochlorothiazide (PRINZIDE,ZESTORETIC) 20-25 MG tablet TAKE 1 TABLET BY MOUTH DAILY. (Patient not taking: Reported on 02/13/2016) 30 tablet 2   No facility-administered medications prior to visit.    ROS Review of Systems  Constitutional: Negative for activity change and appetite change.  HENT: Negative for sinus pressure and sore throat.   Respiratory: Negative for chest tightness, shortness of breath and wheezing.   Cardiovascular: Negative for chest pain and palpitations.  Gastrointestinal: Positive for abdominal pain. Negative for constipation and abdominal distention.  Genitourinary: Negative.   Musculoskeletal:       Pain in toenails  Psychiatric/Behavioral: Negative for behavioral problems and dysphoric mood.    Objective:  BP 124/83 mmHg  Pulse 107  Temp(Src) 98.4 F (36.9 C) (Oral)  Resp 22  Ht  (1.727 m)  Wt 196 lb 9.6 oz (89.177 kg)  BMI 29.90 kg/m2  SpO2 97%  LMP 12/30/2015  BP/Weight 02/13/2016 01/29/2016 01/24/2016  Systolic BP 124 84 116  Diastolic BP 83 54 81  Wt. (Lbs) 196.6 193.5 196  BMI 29.9 30.55 29.81      Physical Exam  Constitutional: She is oriented to person, place, and time. She appears well-developed and well-nourished.  Cardiovascular: Normal heart sounds and intact distal pulses.  Tachycardia present.   No murmur heard. Pulmonary/Chest: Effort normal and breath sounds normal. She has no wheezes. She has no rales. She exhibits no tenderness.  Abdominal: Soft. Bowel sounds are normal. She exhibits no distension and no mass. There is tenderness (epigastric tenderness).  Musculoskeletal: Normal range of motion.  Neurological: She is alert and oriented to person, place, and time.    CMP Latest Ref Rng 01/29/2016 12/05/2015 11/27/2015   Glucose 70 - 99 mg/dL 89 161(W) 83  BUN 6 - 23 mg/dL Creatinine 0.40 - 1.20 mg/dL 9.60 4.54 0.98  Sodium 135 - 145 mEq/L 140 138 137  Potassium 3.5 - 5.1 mEq/L 4.2 3.8 4.4  Chloride 96 - 112 mEq/L 104 104 100  CO2 19 - 32 mEq/L Calcium 8.4 - 10.5 mg/dL 8.7 1.1(B) 1.4(N)  Total Protein 6.0 - 8.3 g/dL 6.0 - 5.7(L)  Total Bilirubin 0.2 - 1.2 mg/dL 0.3 - 0.3  Alkaline Phos 39 - 117 U/L 58 - 60  AST 0 - 37 U/L 16 - 13  ALT 0 - 35 U/L 16 - 9     CBC    Component Value Date/Time   WBC 10.0 01/29/2016 1154   RBC  3.98 01/29/2016 1154   HGB 12.7 01/29/2016 1154   HCT 38.6 01/29/2016 1154   PLT 231.0 01/29/2016 1154   MCV 97.0 01/29/2016 1154   MCH 31.0 12/05/2015 2312   MCHC 32.8 01/29/2016 1154   RDW 14.5 01/29/2016 1154   LYMPHSABS 3.4 01/29/2016 1154   MONOABS 0.6 01/29/2016 1154   EOSABS 0.3 01/29/2016 1154   BASOSABS 0.0 01/29/2016 1154     Assessment & Plan:   1. Essential hypertension Blood pressure is currently controlled off lisinopril/HCTZ Discontinue lisinopril/HCTZ and continue atenolol and clonidine at bedtime Prescribed low dose HCTZ 12.5 mg for patient to use when necessary by mouth for pedal edema  2. PUD (peptic ulcer disease) Currently on PPI and Carafate [facility]. Scheduled for EGD and possible esophageal dilatation.   Meds ordered this encounter  Medications  . hydrochlorothiazide (HYDRODIURIL) 12.5 MG tablet    Sig: Take 1 tablet (12.5 mg total) by mouth daily. As needed for pedal edema    Dispense:  30 tablet    Refill:  1    Follow-up: Return in about 3 months (around 05/15/2016) for Follow-up on hypertension.   Jaclyn ShaggyEnobong Amao MD

## 2016-02-13 NOTE — Patient Instructions (Signed)
Food Choices for Peptic Ulcer Disease °When you have peptic ulcer disease, the foods you eat and your eating habits are very important. Choosing the right foods can help ease the discomfort of peptic ulcer disease. °WHAT GENERAL GUIDELINES DO I NEED TO FOLLOW? °· Choose fruits, vegetables, whole grains, and low-fat meat, fish, and poultry.   °· Keep a food diary to identify foods that cause symptoms. °· Avoid foods that cause irritation or pain. These may be different for different people. °· Eat frequent small meals instead of three large meals each day. The pain may be worse when your stomach is empty.  °· Avoid eating close to bedtime. °WHAT FOODS ARE NOT RECOMMENDED? °The following are some foods and drinks that may worsen your symptoms: °· Black, white, and red pepper. °· Hot sauce. °· Chili peppers. °· Chili powder. °· Chocolate and cocoa.    °· Alcohol. °· Tea, coffee, and cola (regular and decaffeinated). °The items listed above may not be a complete list of foods and beverages to avoid. Contact your dietitian for more information. °  °This information is not intended to replace advice given to you by your health care provider. Make sure you discuss any questions you have with your health care provider. °  °Document Released: 11/02/2011 Document Revised: 08/15/2013 Document Reviewed: 06/14/2013 °Elsevier Interactive Patient Education ©2016 [employer] ° °

## 2016-02-18 ENCOUNTER — Encounter: Payer: Self-pay | Admitting: Internal Medicine

## 2016-02-18 ENCOUNTER — Ambulatory Visit (AMBULATORY_SURGERY_CENTER): Payer: Self-pay | Admitting: Internal Medicine

## 2016-02-18 VITALS — BP 154/85 | HR 82 | Temp 97.8°F | Resp 17 | Ht 66.75 in | Wt 193.0 lb

## 2016-02-18 DIAGNOSIS — R1013 Epigastric pain: Secondary | ICD-10-CM

## 2016-02-18 DIAGNOSIS — R1319 Other dysphagia: Secondary | ICD-10-CM

## 2016-02-18 MED ORDER — SODIUM CHLORIDE 0.9 % IV SOLN: 500.0000 mL | INTRAVENOUS | Status: DC

## 2016-02-18 NOTE — Patient Instructions (Signed)
Patient did not experience any of the following events: a burn prior to discharge; a fall within the facility; wrong site/side/patient/procedure/implant event; or a hospital transfer or hospital admission upon discharge from the facility. (G8907)YOU HAD AN ENDOSCOPIC PROCEDURE TODAY AT THE [facility]:   Refer to the procedure report that was given to you for any specific questions about what was found during the examination.  If the procedure report does not answer your questions, please call your gastroenterologist to clarify.  If you requested that your care partner not be given the details of your procedure findings, then the procedure report has been included in a sealed envelope for you to review at your convenience later.  YOU SHOULD EXPECT: Some feelings of bloating in the abdomen. Passage of more gas than usual.  Walking can help get rid of the air that was put into your GI tract during the procedure and reduce the bloating. If you had a lower endoscopy (such as a colonoscopy or flexible sigmoidoscopy) you may notice spotting of blood in your stool or on the toilet paper. If you underwent a bowel prep for your procedure, you may not have a normal bowel movement for a few days.  Please Note:  You might notice some irritation and congestion in your nose or some drainage.  This is from the oxygen used during your procedure.  There is no need for concern and it should clear up in a day or so.  SYMPTOMS TO REPORT IMMEDIATELY:   Following upper endoscopy (EGD)  Vomiting of blood or coffee ground material  New chest pain or pain under the shoulder blades  Painful or persistently difficult swallowing  New shortness of breath  Fever of 100F or higher  Black, tarry-looking stools  For urgent or emergent issues, a gastroenterologist can be reached at any hour by calling [phone].   DIET: Your first meal following the procedure should be a small meal and then it is ok to progress  to your normal diet. Heavy or fried foods are harder to digest and may make you feel nauseous or bloated.  Likewise, meals heavy in dairy and vegetables can increase bloating.  Drink plenty of fluids but you should avoid alcoholic beverages for 24 hours.  ACTIVITY:  You should plan to take it easy for the rest of today and you should NOT DRIVE or use heavy machinery until tomorrow (because of the sedation medicines used during the test).    FOLLOW UP: Our staff will call the number listed on your records the next business day following your procedure to check on you and address any questions or concerns that you may have regarding the information given to you following your procedure. If we do not reach you, we will leave a message.  However, if you are feeling well and you are not experiencing any problems, there is no need to return our call.  We will assume that you have returned to your regular daily activities without incident.  If any biopsies were taken you will be contacted by phone or by letter within the next 1-3 weeks.  Please call us at [phone] if you have not heard about the biopsies in 3 weeks.    SIGNATURES/CONFIDENTIALITY: You and/or your care partner have signed paperwork which will be entered into your electronic medical record.  These signatures attest to the fact that that the information above on your After Visit Summary has been reviewed and is understood.  Full responsibility  of the confidentiality of this discharge information lies with you and/or your care-partner.  Await pathology results. Dilation diet provided.

## 2016-02-18 NOTE — Progress Notes (Signed)
Report to PACU, RN, vss, BBS= Clear.  

## 2016-02-18 NOTE — Op Note (Signed)
[facility] Patient Name: Amanda AloeDonna Kibby Procedure Date: 02/18/2016 10:08 AM MRN: [id number] Endoscopist: Beverley FiedlerJay M Geanette Buonocore , MD Age: [age] Referring MD:  Date of Birth: [birth year] Gender: Female Account #: [id number] Procedure:                Upper GI endoscopy Indications:              Epigastric abdominal pain despite PPI and Carafate,                            Esophageal dysphagia (history of esophageal                            dilation previously beneficial for dysphagia),                            Gastro-esophageal reflux disease Medicines:                Monitored Anesthesia Care Procedure:                Pre-Anesthesia Assessment:                           - Prior to the procedure, a History and Physical                            was performed, and patient medications and                            allergies were reviewed. The patient's tolerance of                            previous anesthesia was also reviewed. The risks                            and benefits of the procedure and the sedation                            options and risks were discussed with the patient.                            All questions were answered, and informed consent                            was obtained. Prior Anticoagulants: The patient has                            taken no previous anticoagulant or antiplatelet                            agents. ASA Grade Assessment: II - A patient with                            mild systemic disease. After reviewing the risks  and benefits, the patient was deemed in                            satisfactory condition to undergo the procedure.                           After obtaining informed consent, the endoscope was                            passed under direct vision. Throughout the                            procedure, the patient's blood pressure, pulse, and                            oxygen saturations were  monitored continuously. The                            Model GIF-[id number]) scope was introduced                            through the mouth, and advanced to the second part                            of duodenum. The upper GI endoscopy was                            accomplished without difficulty. The patient                            tolerated the procedure well. Scope In: Scope Out: Findings:                 No endoscopic abnormality was evident in the                            esophagus to explain the patient's complaint of                            dysphagia. This was biopsied with a cold forceps                            for evaluation of eosinophilic esophagitis. It was                            decided given previous response, however, to                            proceed with dilation of the entire esophagus. A                            guidewire was placed and the scope was withdrawn.  Dilation was performed with a Savary dilator with                            no resistance at 51 Fr.                           The Z-line was regular and was found 40 cm from the                            incisors.                           The cardia and gastric fundus were normal on                            retroflexion.                           The entire examined stomach was normal. Biopsies                            were taken with a cold forceps for histology and                            Helicobacter pylori testing.                           The examined duodenum was normal. Biopsies for                            histology were taken with a cold forceps for                            evaluation of celiac disease. Complications:            No immediate complications. Estimated Blood Loss:     Estimated blood loss was minimal. Impression:               - No endoscopic esophageal abnormality to explain                            patient's  dysphagia. Esophagus dilated. Biopsied.                           - Z-line regular, 40 cm from the incisors.                           - Normal stomach. Biopsied.                           - Normal examined duodenum. Biopsied. Recommendation:           - Patient has a contact number available for                            emergencies. The signs and symptoms of potential  delayed complications were discussed with the                            patient. Return to normal activities tomorrow.                            Written discharge instructions were provided to the                            patient.                           - Resume previous diet.                           - Continue present medications.                           - Await pathology results. If biopsies unrevealing                            would consider gastric emptying study. Beverley Fiedler, MD 02/18/2016 10:44:10 AM This report has been signed electronically.

## 2016-02-18 NOTE — Progress Notes (Signed)
Called to room to assist during endoscopic procedure.  Patient ID and intended procedure confirmed with present staff. Received instructions for my participation in the procedure from the performing physician.  

## 2016-02-19 ENCOUNTER — Telehealth: Payer: Self-pay

## 2016-02-19 NOTE — Telephone Encounter (Signed)
  Follow up Call-  Call back number 02/18/2016  Post procedure Call Back phone  # [phone]  Permission to leave phone message Yes    Patient was called for follow up after her procedure on 02/18/2016. No answer at the number given for follow up phone call. A message was left on the answering machine.

## 2016-02-19 NOTE — Telephone Encounter (Signed)
Pa 

## 2016-02-24 ENCOUNTER — Telehealth: Payer: Self-pay

## 2016-02-24 NOTE — Telephone Encounter (Signed)
Returned pt phone call regarding the discoloration and drainage of her toe. She is post nail avulsion done 1 month ago and is concerned with the progress and continued pain and drainage. Advised to continue with soaks, Will refill Rx for tramadol, transferred to schedulers to make an appt within the next week

## 2016-02-25 ENCOUNTER — Encounter: Payer: Self-pay | Admitting: Internal Medicine

## 2016-02-27 ENCOUNTER — Ambulatory Visit: Payer: No Typology Code available for payment source | Admitting: Podiatry

## 2016-02-28 MED FILL — TOPIRAMATE 50 MG TABLET: 50 | 30 days supply | Qty: 60 | Fill #1

## 2016-02-28 MED FILL — ?CYCLOBENZAPRINE 10 MG TABL: 10 | 30 days supply | Qty: 60 | Fill #2

## 2016-02-28 MED FILL — LISINOPRIL-HCTZ 20-25 MG TA: 20-25 | 30 days supply | Qty: 30 | Fill #1

## 2016-02-28 MED FILL — ?CETIRIZINE HCL 10 MG TABLE: 10 | 30 days supply | Qty: 30 | Fill #1

## 2016-03-03 ENCOUNTER — Ambulatory Visit: Payer: No Typology Code available for payment source | Admitting: Podiatry

## 2016-03-04 ENCOUNTER — Telehealth: Payer: Self-pay | Admitting: Internal Medicine

## 2016-03-04 NOTE — Telephone Encounter (Signed)
Left message for pt to call back  °

## 2016-03-10 NOTE — Telephone Encounter (Signed)
Pt did not return call. 

## 2016-03-11 MED FILL — OMEPRAZOLE DR 40 MG CAPSULE: 40 | 30 days supply | Qty: 30 | Fill #2

## 2016-03-11 MED FILL — HYDROXYZINE PAM 50 MG CAP: 50 | 30 days supply | Qty: 120 | Fill #1

## 2016-03-11 MED FILL — ?ATENOLOL 25 MG TABLET: 25 | 30 days supply | Qty: 120 | Fill #0

## 2016-03-11 MED FILL — VENTOLIN HFA 90 MCG INHALER: 108 (90 BAS | 25 days supply | Qty: 18 | Fill #3

## 2016-03-12 ENCOUNTER — Telehealth: Payer: Self-pay

## 2016-03-12 NOTE — Telephone Encounter (Signed)
-----   Message from Beverley FiedlerJay M Pyrtle, MD sent at 03/09/2016  8:08 PM EDT ----- Regarding: Colon Saw Amy in June Came for EGD I reviewed records from Dr. Loreta AveMann At 1 point colon was recommended by Dr. Loreta AveMann, but I did not see it was performed.  Not in the records I received. If no previous colon, then I recommend colonoscopy for screening.  Also note to me on the colon visit that bx needed for chronic diarrhea Thanks JMP

## 2016-03-12 NOTE — Telephone Encounter (Signed)
Pt scheduled for previsit 04/02/16@3 :30pm, colon scheduled in the [facility] with Dr. Rhea BeltonPyrtle 04/15/16@2 :30pm. Pt aware of appts, states she did not have a previous  Colon done.

## 2016-03-19 ENCOUNTER — Other Ambulatory Visit: Payer: Self-pay | Admitting: Licensed Clinical Social Worker

## 2016-03-23 ENCOUNTER — Encounter: Payer: Self-pay | Admitting: Podiatry

## 2016-03-23 ENCOUNTER — Ambulatory Visit (INDEPENDENT_AMBULATORY_CARE_PROVIDER_SITE_OTHER): Payer: No Typology Code available for payment source

## 2016-03-23 ENCOUNTER — Ambulatory Visit (INDEPENDENT_AMBULATORY_CARE_PROVIDER_SITE_OTHER): Payer: No Typology Code available for payment source | Admitting: Podiatry

## 2016-03-23 DIAGNOSIS — R52 Pain, unspecified: Secondary | ICD-10-CM

## 2016-03-23 DIAGNOSIS — T8484XD Pain due to internal orthopedic prosthetic devices, implants and grafts, subsequent encounter: Secondary | ICD-10-CM

## 2016-03-23 DIAGNOSIS — L84 Corns and callosities: Secondary | ICD-10-CM

## 2016-03-23 MED FILL — DIVALPROEX SOD DR 500 MG TA: 500 | 30 days supply | Qty: 90 | Fill #1

## 2016-03-23 MED FILL — MIRTAZAPINE 30 MG TABLET: 30 | 30 days supply | Qty: 30 | Fill #1

## 2016-03-23 MED FILL — traZODone HCL 150 MG TABS: 150 | 30 days supply | Qty: 60 | Fill #1

## 2016-03-23 MED FILL — traMADol HCL 50 MG TABS: 50 | 30 days supply | Qty: 60 | Fill #1

## 2016-03-23 MED FILL — GABAPENTIN 800 MG TABLET: 800 | 30 days supply | Qty: 90 | Fill #1

## 2016-03-23 MED FILL — HALOPERIDOL 5 MG TABLET: 5 | 30 days supply | Qty: 30 | Fill #1

## 2016-03-23 MED FILL — ?HYDROCHLOROTHIAZIDE 12.5MG: 12.5 | 30 days supply | Qty: 30 | Fill #1

## 2016-03-23 NOTE — Patient Instructions (Signed)
Pre-Operative Instructions  Congratulations, you have decided to take an important step to improving your quality of life.  You can be assured that the doctors of [facility] will be with you every step of the way.  1. Plan to be at the surgery center/hospital at least 1 (one) hour prior to your scheduled time unless otherwise directed by the surgical center/hospital staff.  You must have a responsible adult accompany you, remain during the surgery and drive you home.  Make sure you have directions to the surgical center/hospital and know how to get there on time. 2. For hospital based surgery you will need to obtain a history and physical form from your family physician within 1 month prior to the date of surgery- we will give you a form for you primary physician.  3. We make every effort to accommodate the date you request for surgery.  There are however, times where surgery dates or times have to be moved.  We will contact you as soon as possible if a change in schedule is required.   4. No Aspirin/Ibuprofen for one week before surgery.  If you are on aspirin, any non-steroidal anti-inflammatory medications (Mobic, Aleve, Ibuprofen) you should stop taking it 7 days prior to your surgery.  You make take Tylenol  For pain prior to surgery.  5. Medications- If you are taking daily heart and blood pressure medications, seizure, reflux, allergy, asthma, anxiety, pain or diabetes medications, make sure the surgery center/hospital is aware before the day of surgery so they may notify you which medications to take or avoid the day of surgery. 6. No food or drink after midnight the night before surgery unless directed otherwise by surgical center/hospital staff. 7. No alcoholic beverages 24 hours prior to surgery.  No smoking 24 hours prior to or 24 hours after surgery. 8. Wear loose pants or shorts- loose enough to fit over bandages, boots, and casts. 9. No slip on shoes, sneakers are best. 10. Bring  your boot with you to the surgery center/hospital.  Also bring crutches or a walker if your physician has prescribed it for you.  If you do not have this equipment, it will be provided for you after surgery. 11. If you have not been contracted by the surgery center/hospital by the day before your surgery, call to confirm the date and time of your surgery. 12. Leave-time from work may vary depending on the type of surgery you have.  Appropriate arrangements should be made prior to surgery with your employer. 13. Prescriptions will be provided immediately following surgery by your doctor.  Have these filled as soon as possible after surgery and take the medication as directed. 14. Remove nail polish on the operative foot. 15. Wash the night before surgery.  The night before surgery wash the foot and leg well with the antibacterial soap provided and water paying special attention to beneath the toenails and in between the toes.  Rinse thoroughly with water and dry well with a towel.  Perform this wash unless told not to do so by your physician.  Enclosed: 1 Ice pack (please put in freezer the night before surgery)   1 Hibiclens skin cleaner   Pre-op Instructions  If you have any questions regarding the instructions, do not hesitate to call our office.  [place]: [address] [place], [place] [address] [phone]  [place]: [address] [place], [place] [address] [phone]  [place]: [address]  , [place] [address] [phone]   Dr.   Norman Regal DPM, Dr. Matthew Wagoner DPM, Dr. M. Todd Hyatt DPM, Dr. Titorya Stover DPM 

## 2016-03-24 ENCOUNTER — Ambulatory Visit (INDEPENDENT_AMBULATORY_CARE_PROVIDER_SITE_OTHER): Payer: Self-pay | Admitting: Licensed Clinical Social Worker

## 2016-03-24 DIAGNOSIS — F410 Panic disorder [episodic paroxysmal anxiety] without agoraphobia: Secondary | ICD-10-CM

## 2016-03-24 DIAGNOSIS — L84 Corns and callosities: Secondary | ICD-10-CM | POA: Insufficient documentation

## 2016-03-24 DIAGNOSIS — F4310 Post-traumatic stress disorder, unspecified: Secondary | ICD-10-CM

## 2016-03-24 DIAGNOSIS — F3175 Bipolar disorder, in partial remission, most recent episode depressed: Secondary | ICD-10-CM

## 2016-03-24 DIAGNOSIS — T8484XA Pain due to internal orthopedic prosthetic devices, implants and grafts, initial encounter: Secondary | ICD-10-CM | POA: Insufficient documentation

## 2016-03-24 NOTE — Progress Notes (Signed)
Subjective: Patient presents today for concerns of calluses to both of her feet as well as for painful hardware on the right foot. She appears to have bunion surgery several years ago and she is started on the site and she states that she can feel the hardware this is causing pressure and pain within shoes and pressure. She has tried offloading shoe changes without any relief from this time she is requesting that the hardware be removed. She denies any drainage or redness or swelling to her feet otherwise.Denies any systemic complaints such as fevers, chills, nausea, vomiting. No acute changes since last appointment, and no other complaints at this time.   Objective: AAO x3, NAD DP/PT pulses palpable bilaterally, CRT less than 3 seconds Bilateral hallux nails which were removal are healed. Hyperkeratotic lesions present bilateral plantar feet 2. No underlying ulceration, drainage or other signs of infection.  Along the right incision from the prior surgery can palpate hardware and is a small hyperkeratotic lesion overlying this area. There is tenderness palpation directly on this area. There is no edema, erythema or skin breakdown this time. No edema, erythema, increase in warmth to bilateral lower extremities.  No open lesions or pre-ulcerative lesions.  No pain with calf compression, swelling, warmth, erythema  Assessment: Prominent hardware right foot, hyperkeratotic lesions  Plan: -All treatment options discussed with the patient including all alternatives, risks, complications.  -Keratotic lesions debrided 2 without complications or bleeding -At this time she is requesting hardware removal. I discussed the risks, complications and wishes to proceed. We will do this in the office.The incision placement as well as the postoperative course was discussed with the patient. I discussed risks of the surgery which include, but not limited to, infection, bleeding, pain, swelling, need for further  surgery, delayed or nonhealing, painful or ugly scar, numbness or sensation changes, over/under correction, recurrence, transfer lesions, further deformity, hardware failure, DVT/PE, loss of toe/foot, unable to remove the hardware or the hardware breaks and she has retained hardware. Patient understands these risks and wishes to proceed with surgery. The surgical consent was reviewed with the patient all 3 pages were signed. No promises or guarantees were given to the outcome of the procedure. All questions were answered to the best of my ability. Before the surgery the patient was encouraged to call the office if there is any further questions.  -Patient encouraged to call the office with any questions, concerns, change in symptoms.   Ovid Curd, DPM
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
## 2016-03-31 ENCOUNTER — Other Ambulatory Visit: Payer: Self-pay | Admitting: Licensed Clinical Social Worker

## 2016-04-02 ENCOUNTER — Ambulatory Visit (AMBULATORY_SURGERY_CENTER): Payer: Self-pay | Admitting: *Deleted

## 2016-04-02 VITALS — Ht 66.75 in | Wt 209.0 lb

## 2016-04-02 DIAGNOSIS — Z1211 Encounter for screening for malignant neoplasm of colon: Secondary | ICD-10-CM

## 2016-04-02 MED ORDER — NA SULFATE-K SULFATE-MG SULF 17.5-3.13-1.6 GM/177ML PO SOLN: ORAL | 0 refills | Status: DC

## 2016-04-02 NOTE — Progress Notes (Signed)
Patient denies any allergies to eggs or soy. Patient denies any problems with anesthesia/sedation. Patient denies any oxygen use at home and does not take any diet/weight loss medications. Marguerita BeardsLeslie Wrenn,CMA sent flag, patient needs Suprep sample.

## 2016-04-06 ENCOUNTER — Other Ambulatory Visit: Payer: Self-pay | Admitting: Family Medicine

## 2016-04-06 MED FILL — VENTOLIN HFA 90 MCG INHALER: 108 (90 BAS | 25 days supply | Qty: 18 | Fill #0

## 2016-04-07 ENCOUNTER — Telehealth: Payer: Self-pay

## 2016-04-07 ENCOUNTER — Telehealth: Payer: Self-pay | Admitting: *Deleted

## 2016-04-07 MED FILL — ?CETIRIZINE HCL 10 MG TABLE: 10 | 30 days supply | Qty: 30 | Fill #2

## 2016-04-07 MED FILL — TOPIRAMATE 50 MG TABLET: 50 | 30 days supply | Qty: 60 | Fill #2

## 2016-04-07 NOTE — Telephone Encounter (Signed)
Left message that I had a Suprep sample for patient and would leave it up front to be picked up. 

## 2016-04-07 NOTE — Telephone Encounter (Signed)
Erroneous encounter

## 2016-04-07 NOTE — Telephone Encounter (Signed)
Spoke with patient states she cannot come by today but will come by the next couple of days to pick up suprep sample.

## 2016-04-07 NOTE — Telephone Encounter (Signed)
I'm returning your call.  How can I help you?  "I was just canceling my surgery for September 18."  Would you like to reschedule?  "No, I don't."  Is your foot feeling better?  "It's okay."  Well if you would like to be treated conservatively give us a call.  "I will."

## 2016-04-09 MED FILL — OMEPRAZOLE DR 40 MG CAPSULE: 40 | 30 days supply | Qty: 60 | Fill #0

## 2016-04-10 ENCOUNTER — Other Ambulatory Visit: Payer: Self-pay | Admitting: Family Medicine

## 2016-04-10 MED FILL — HYDROXYZINE PAM 50 MG CAP: 50 | 30 days supply | Qty: 120 | Fill #0

## 2016-04-15 ENCOUNTER — Encounter: Payer: Self-pay | Admitting: Internal Medicine

## 2016-04-15 ENCOUNTER — Ambulatory Visit (AMBULATORY_SURGERY_CENTER): Payer: Self-pay | Admitting: Internal Medicine

## 2016-04-15 VITALS — BP 158/81 | HR 72 | Temp 98.2°F | Resp 11 | Ht 66.0 in | Wt 209.0 lb

## 2016-04-15 DIAGNOSIS — K589 Irritable bowel syndrome without diarrhea: Secondary | ICD-10-CM

## 2016-04-15 DIAGNOSIS — R197 Diarrhea, unspecified: Secondary | ICD-10-CM

## 2016-04-15 DIAGNOSIS — Z1211 Encounter for screening for malignant neoplasm of colon: Secondary | ICD-10-CM

## 2016-04-15 MED ORDER — DICYCLOMINE HCL 10 MG PO CAPS: 20.0000 mg | ORAL_CAPSULE | Freq: Three times a day (TID) | ORAL | 2 refills | Status: DC

## 2016-04-15 MED ORDER — SODIUM CHLORIDE 0.9 % IV SOLN: 500.0000 mL | INTRAVENOUS | Status: DC

## 2016-04-15 MED ORDER — DICYCLOMINE HCL 10 MG PO CAPS: ORAL_CAPSULE | ORAL | 2 refills | Status: DC

## 2016-04-15 MED FILL — DICYCLOMINE 10 MG CAPSULE: 10 | 15 days supply | Qty: 90 | Fill #0

## 2016-04-15 NOTE — Progress Notes (Signed)
Patient ID: Amanda FillersDonna A Dail, female   DOB: [birth year], 50 y.o.   MRN: [id number] [facility] Came up as 10mg   2 three times a day.  Spoke with pharmacy  Tech.  Advised her that rx. To be  20mg . 1 three times a day.  Original rx cancelled and  New rx resent to read 1 three times daily-20mg .  90 with 2 refills.

## 2016-04-15 NOTE — Progress Notes (Signed)
Called to room to assist during endoscopic procedure.  Patient ID and intended procedure confirmed with present staff. Received instructions for my participation in the procedure from the performing physician.  

## 2016-04-15 NOTE — Progress Notes (Signed)
A and O x3. Report to RN. Tolerated MAC anesthesia well. 

## 2016-04-15 NOTE — Op Note (Signed)
[facility] Patient Name: Amanda [name] Procedure Date: 04/15/2016 2:22 PM MRN: [id number] Endoscopist: Beverley Fiedler , MD Age: 50 Referring MD:  Date of Birth: [birth year] Gender: Female Account #: [id number] Procedure:                Colonoscopy Indications:              Screening for colorectal malignant neoplasm, This                            is the patient's first colonoscopy, chronic diarrhea Medicines:                Monitored Anesthesia Care Procedure:                Pre-Anesthesia Assessment:                           - Prior to the procedure, a History and Physical                            was performed, and patient medications and                            allergies were reviewed. The patient's tolerance of                            previous anesthesia was also reviewed. The risks                            and benefits of the procedure and the sedation                            options and risks were discussed with the patient.                            All questions were answered, and informed consent                            was obtained. Prior Anticoagulants: The patient has                            taken no previous anticoagulant or antiplatelet                            agents. ASA Grade Assessment: II - A patient with                            mild systemic disease. After reviewing the risks                            and benefits, the patient was deemed in                            satisfactory condition to undergo the procedure.  After obtaining informed consent, the colonoscope                            was passed under direct vision. Throughout the                            procedure, the patient's blood pressure, pulse, and                            oxygen saturations were monitored continuously. The                            Model PCF-H190L [id number]) scope was introduced                            through  the anus and advanced to the the cecum,                            identified by appendiceal orifice and ileocecal                            valve. The colonoscopy was performed without                            difficulty. The patient tolerated the procedure                            well. The quality of the bowel preparation was                            good. The ileocecal valve, appendiceal orifice, and                            rectum were photographed. Scope In: 2:27:03 PM Scope Out: 2:40:06 PM Scope Withdrawal Time: 0 hours 10 minutes 24 seconds  Total Procedure Duration: 0 hours 13 minutes 3 seconds  Findings:                 The digital rectal exam was normal.                           A few small-mouthed diverticula were found in the                            sigmoid colon.                           Normal mucosa was found in the entire colon.                            Biopsies for histology were taken with a cold                            forceps from the right colon and left colon for  evaluation of microscopic colitis.                           Internal hemorrhoids were found during                            retroflexion. The hemorrhoids were small. Complications:            No immediate complications. Estimated Blood Loss:     Estimated blood loss: none. Impression:               - Diverticulosis in the sigmoid colon.                           - Normal mucosa in the entire examined colon.                            Biopsied.                           - Small internal hemorrhoids. Recommendation:           - Patient has a contact number available for                            emergencies. The signs and symptoms of potential                            delayed complications were discussed with the                            patient. Return to normal activities tomorrow.                            Written discharge instructions were provided  to the                            patient.                           - Resume previous diet.                           - Continue present medications. Stop Carafate                            (sucralfate). Begin Bentyl 20 mg three times per                            day for abdominal pain and loose stools.                           - Await pathology results.                           - Repeat colonoscopy in 10 years for screening  purposes.                           - Office follow-up next available to discuss IBS                            with diarrhea. Beverley FiedlerJay M Harli Engelken, MD 04/15/2016 2:44:40 PM This report has been signed electronically.

## 2016-04-15 NOTE — Patient Instructions (Signed)
YOU HAD AN ENDOSCOPIC PROCEDURE TODAY AT THE [facility]:   Refer to the procedure report that was given to you for any specific questions about what was found during the examination.  If the procedure report does not answer your questions, please call your gastroenterologist to clarify.  If you requested that your care partner not be given the details of your procedure findings, then the procedure report has been included in a sealed envelope for you to review at your convenience later.  YOU SHOULD EXPECT: Some feelings of bloating in the abdomen. Passage of more gas than usual.  Walking can help get rid of the air that was put into your GI tract during the procedure and reduce the bloating. If you had a lower endoscopy (such as a colonoscopy or flexible sigmoidoscopy) you may notice spotting of blood in your stool or on the toilet paper. If you underwent a bowel prep for your procedure, you may not have a normal bowel movement for a few days.  Please Note:  You might notice some irritation and congestion in your nose or some drainage.  This is from the oxygen used during your procedure.  There is no need for concern and it should clear up in a day or so.  SYMPTOMS TO REPORT IMMEDIATELY:   Following lower endoscopy (colonoscopy or flexible sigmoidoscopy):  Excessive amounts of blood in the stool  Significant tenderness or worsening of abdominal pains  Swelling of the abdomen that is new, acute  Fever of 100F or higher   Following upper endoscopy (EGD)  Vomiting of blood or coffee ground material  New chest pain or pain under the shoulder blades  Painful or persistently difficult swallowing  New shortness of breath  Fever of 100F or higher  Black, tarry-looking stools  For urgent or emergent issues, a gastroenterologist can be reached at any hour by calling [phone].   DIET:  We do recommend a small meal at first, but then you may proceed to your regular diet.  Drink  plenty of fluids but you should avoid alcoholic beverages for 24 hours.  ACTIVITY:  You should plan to take it easy for the rest of today and you should NOT DRIVE or use heavy machinery until tomorrow (because of the sedation medicines used during the test).    FOLLOW UP: Our staff will call the number listed on your records the next business day following your procedure to check on you and address any questions or concerns that you may have regarding the information given to you following your procedure. If we do not reach you, we will leave a message.  However, if you are feeling well and you are not experiencing any problems, there is no need to return our call.  We will assume that you have returned to your regular daily activities without incident.  If any biopsies were taken you will be contacted by phone or by letter within the next 1-3 weeks.  Please call us at [phone] if you have not heard about the biopsies in 3 weeks.    SIGNATURES/CONFIDENTIALITY: You and/or your care partner have signed paperwork which will be entered into your electronic medical record.  These signatures attest to the fact that that the information above on your After Visit Summary has been reviewed and is understood.  Full responsibility of the confidentiality of this discharge information lies with you and/or your care-partner.  Diverticulosis and hemorrhoid information given.11  Recall colonoscopy 10 years-2027.  Take Bentyl as directed.  Follow up office visit with Dr. Rhea BeltonPyrtle to discuss IBS and diarrhea

## 2016-04-16 ENCOUNTER — Telehealth: Payer: Self-pay | Admitting: *Deleted

## 2016-04-16 NOTE — Telephone Encounter (Signed)
  Follow up Call-  Call back number 04/15/2016 02/18/2016  Post procedure Call Back phone  # [phone]  Permission to leave phone message Yes Yes  Some recent data might be hidden     No answer, left message.

## 2016-04-20 ENCOUNTER — Encounter: Payer: Self-pay | Admitting: Internal Medicine

## 2016-04-21 ENCOUNTER — Ambulatory Visit: Payer: Self-pay | Admitting: Family Medicine

## 2016-04-21 ENCOUNTER — Ambulatory Visit: Payer: Self-pay | Attending: Family Medicine

## 2016-04-21 MED FILL — MIRTAZAPINE 30 MG TABLET: 30 | 30 days supply | Qty: 30 | Fill #0

## 2016-04-21 MED FILL — CYCLOBENZAPRINE 10 MG TAB: 10 | 30 days supply | Qty: 60 | Fill #3

## 2016-04-21 MED FILL — GABAPENTIN 800 MG TABLET: 800 | 30 days supply | Qty: 120 | Fill #0

## 2016-04-24 ENCOUNTER — Ambulatory Visit: Payer: Self-pay | Admitting: Family Medicine

## 2016-04-28 ENCOUNTER — Encounter (HOSPITAL_BASED_OUTPATIENT_CLINIC_OR_DEPARTMENT_OTHER)
Admission: RE | Admit: 2016-04-28 | Discharge: 2016-04-28 | Disposition: A | Discharge status: Home / Self Care | Payer: Self-pay | Source: Ambulatory Visit | Attending: Orthopaedic Surgery | Admitting: Orthopaedic Surgery

## 2016-04-28 ENCOUNTER — Other Ambulatory Visit: Payer: Self-pay | Admitting: Orthopaedic Surgery

## 2016-04-28 ENCOUNTER — Encounter (HOSPITAL_BASED_OUTPATIENT_CLINIC_OR_DEPARTMENT_OTHER): Payer: Self-pay | Admitting: *Deleted

## 2016-04-28 LAB — BASIC METABOLIC PANEL
Anion gap: 7 (ref 5–15)
BUN: 8 mg/dL (ref 6–20)
CO2: 20 mmol/L — ABNORMAL LOW (ref 22–32)
Calcium: 8.8 mg/dL — ABNORMAL LOW (ref 8.9–10.3)
Chloride: 112 mmol/L — ABNORMAL HIGH (ref 101–111)
Creatinine, Ser: 0.86 mg/dL (ref 0.44–1.00)
GFR calc Af Amer: >60 mL/min (ref >60)
GFR calc non Af Amer: >60 mL/min (ref >60)
Glucose, Bld: 87 mg/dL (ref 65–99)
Potassium: 3.9 mmol/L (ref 3.5–5.1)
Sodium: 139 mmol/L (ref 135–145)

## 2016-04-28 NOTE — Anesthesia Preprocedure Evaluation (Addendum)
Anesthesia Evaluation  Patient identified by MRN, date of birth, ID band Patient awake    Reviewed: Allergy & Precautions, NPO status , Patient's Chart, lab work & pertinent test results  History of Anesthesia Complications Negative for: history of anesthetic complications  Airway Mallampati: II  TM Distance: >3 FB Neck ROM: Full    Dental no notable dental hx. (+) Dental Advisory Given, Poor Dentition   Pulmonary asthma ,    Pulmonary exam normal breath sounds clear to auscultation       Cardiovascular hypertension, Pt. on medications and Pt. on home beta blockers Normal cardiovascular exam Rhythm:Regular Rate:Normal     Neuro/Psych  Headaches, PSYCHIATRIC DISORDERS Anxiety Depression Bipolar Disorder    GI/Hepatic GERD  Medicated and Controlled,(+)     substance abuse (hx of cocaine use, reports none in >1 year, MJ use with last 2 weeks prior)  alcohol use, cocaine use and marijuana use,   Endo/Other  obesity  Renal/GU negative Renal ROS  negative genitourinary   Musculoskeletal  (+) Arthritis ,   Abdominal   Peds negative pediatric ROS (+)  Hematology negative hematology ROS (+)   Anesthesia Other Findings   Reproductive/Obstetrics negative OB ROS                            Anesthesia Physical Anesthesia Plan  ASA: II  Anesthesia Plan: General   Post-op Pain Management:    Induction: Intravenous  Airway Management Planned: LMA  Additional Equipment:   Intra-op Plan:   Post-operative Plan: Extubation in OR  Informed Consent: I have reviewed the patients History and Physical, chart, labs and discussed the procedure including the risks, benefits and alternatives for the proposed anesthesia with the patient or authorized representative who has indicated his/her understanding and acceptance.   Dental advisory given  Plan Discussed with: CRNA  Anesthesia Plan Comments:          Anesthesia Quick Evaluation

## 2016-04-29 ENCOUNTER — Encounter (HOSPITAL_BASED_OUTPATIENT_CLINIC_OR_DEPARTMENT_OTHER)
Admission: RE | Disposition: A | Discharge status: Home / Self Care | Payer: Self-pay | Source: Ambulatory Visit | Attending: Orthopaedic Surgery

## 2016-04-29 ENCOUNTER — Ambulatory Visit (HOSPITAL_BASED_OUTPATIENT_CLINIC_OR_DEPARTMENT_OTHER): Payer: Self-pay | Admitting: Anesthesiology

## 2016-04-29 ENCOUNTER — Ambulatory Visit (HOSPITAL_BASED_OUTPATIENT_CLINIC_OR_DEPARTMENT_OTHER)
Admission: RE | Admit: 2016-04-29 | Discharge: 2016-04-29 | Disposition: A | Discharge status: Home / Self Care | Payer: Self-pay | Source: Ambulatory Visit | Attending: Orthopaedic Surgery | Admitting: Orthopaedic Surgery

## 2016-04-29 ENCOUNTER — Encounter (HOSPITAL_BASED_OUTPATIENT_CLINIC_OR_DEPARTMENT_OTHER): Payer: Self-pay | Admitting: *Deleted

## 2016-04-29 DIAGNOSIS — E669 Obesity, unspecified: Secondary | ICD-10-CM | POA: Insufficient documentation

## 2016-04-29 DIAGNOSIS — J45909 Unspecified asthma, uncomplicated: Secondary | ICD-10-CM | POA: Insufficient documentation

## 2016-04-29 DIAGNOSIS — F419 Anxiety disorder, unspecified: Secondary | ICD-10-CM | POA: Insufficient documentation

## 2016-04-29 DIAGNOSIS — F1490 Cocaine use, unspecified, uncomplicated: Secondary | ICD-10-CM | POA: Insufficient documentation

## 2016-04-29 DIAGNOSIS — Z6834 Body mass index (BMI) 34.0-34.9, adult: Secondary | ICD-10-CM | POA: Insufficient documentation

## 2016-04-29 DIAGNOSIS — G629 Polyneuropathy, unspecified: Secondary | ICD-10-CM | POA: Insufficient documentation

## 2016-04-29 DIAGNOSIS — T8484XA Pain due to internal orthopedic prosthetic devices, implants and grafts, initial encounter: Secondary | ICD-10-CM | POA: Insufficient documentation

## 2016-04-29 DIAGNOSIS — T8484XD Pain due to internal orthopedic prosthetic devices, implants and grafts, subsequent encounter: Secondary | ICD-10-CM

## 2016-04-29 DIAGNOSIS — F1290 Cannabis use, unspecified, uncomplicated: Secondary | ICD-10-CM | POA: Insufficient documentation

## 2016-04-29 DIAGNOSIS — Y831 Surgical operation with implant of artificial internal device as the cause of abnormal reaction of the patient, or of later complication, without mention of misadventure at the time of the procedure: Secondary | ICD-10-CM | POA: Insufficient documentation

## 2016-04-29 DIAGNOSIS — K219 Gastro-esophageal reflux disease without esophagitis: Secondary | ICD-10-CM | POA: Insufficient documentation

## 2016-04-29 DIAGNOSIS — Z880 Allergy status to penicillin: Secondary | ICD-10-CM | POA: Insufficient documentation

## 2016-04-29 DIAGNOSIS — M2021 Hallux rigidus, right foot: Secondary | ICD-10-CM | POA: Insufficient documentation

## 2016-04-29 DIAGNOSIS — F319 Bipolar disorder, unspecified: Secondary | ICD-10-CM | POA: Insufficient documentation

## 2016-04-29 DIAGNOSIS — I10 Essential (primary) hypertension: Secondary | ICD-10-CM | POA: Insufficient documentation

## 2016-04-29 DIAGNOSIS — M1990 Unspecified osteoarthritis, unspecified site: Secondary | ICD-10-CM | POA: Insufficient documentation

## 2016-04-29 HISTORY — DX: Dorsalgia, unspecified: M54.9

## 2016-04-29 HISTORY — DX: Other chronic pain: G89.29

## 2016-04-29 HISTORY — PX: ARTHRODESIS METATARSALPHALANGEAL JOINT (MTPJ): SHX6566

## 2016-04-29 HISTORY — PX: HARDWARE REMOVAL: SHX979

## 2016-04-29 SURGERY — FUSION, JOINT, GREAT TOE
Anesthesia: General | Site: Foot | Laterality: Right

## 2016-04-29 MED ORDER — SCOPOLAMINE 1 MG/3DAYS TD PT72: 1.0000 | MEDICATED_PATCH | Freq: Once | TRANSDERMAL | Status: DC | PRN

## 2016-04-29 MED ORDER — MIDAZOLAM HCL 2 MG/2ML IJ SOLN
1.0000 mg | INTRAMUSCULAR | Status: DC | PRN
  Administered 2016-04-29: 2 mg via INTRAVENOUS

## 2016-04-29 MED ORDER — OXYCODONE HCL ER 10 MG PO T12A: 10.0000 mg | EXTENDED_RELEASE_TABLET | Freq: Two times a day (BID) | ORAL | 0 refills | Status: DC

## 2016-04-29 MED ORDER — ONDANSETRON HCL 4 MG/2ML IJ SOLN: 4.0000 mg | Freq: Once | INTRAMUSCULAR | Status: DC | PRN

## 2016-04-29 MED ORDER — MIDAZOLAM HCL 2 MG/2ML IJ SOLN
INTRAMUSCULAR | Status: AC
  Filled 2016-04-29: qty 2

## 2016-04-29 MED ORDER — FENTANYL CITRATE (PF) 100 MCG/2ML IJ SOLN
50.0000 ug | INTRAMUSCULAR | Status: DC | PRN
  Administered 2016-04-29: 100 ug via INTRAVENOUS

## 2016-04-29 MED ORDER — ONDANSETRON HCL 4 MG/2ML IJ SOLN
INTRAMUSCULAR | Status: DC | PRN
  Administered 2016-04-29: 4 mg via INTRAVENOUS

## 2016-04-29 MED ORDER — OXYCODONE HCL 5 MG PO TABS: 5.0000 mg | ORAL_TABLET | ORAL | 0 refills | Status: DC | PRN

## 2016-04-29 MED ORDER — BUPIVACAINE-EPINEPHRINE (PF) 0.5% -1:200000 IJ SOLN
INTRAMUSCULAR | Status: DC | PRN
  Administered 2016-04-29: 25 mL

## 2016-04-29 MED ORDER — LIDOCAINE 2% (20 MG/ML) 5 ML SYRINGE
INTRAMUSCULAR | Status: DC | PRN
  Administered 2016-04-29: 60 mg via INTRAVENOUS

## 2016-04-29 MED ORDER — FENTANYL CITRATE (PF) 100 MCG/2ML IJ SOLN: 25.0000 ug | INTRAMUSCULAR | Status: DC | PRN

## 2016-04-29 MED ORDER — PROPOFOL 10 MG/ML IV BOLUS
INTRAVENOUS | Status: AC
  Filled 2016-04-29: qty 20

## 2016-04-29 MED ORDER — LACTATED RINGERS IV SOLN
INTRAVENOUS | Status: DC
  Administered 2016-04-29 (×2): via INTRAVENOUS

## 2016-04-29 MED ORDER — DEXAMETHASONE SODIUM PHOSPHATE 10 MG/ML IJ SOLN
INTRAMUSCULAR | Status: DC | PRN
  Administered 2016-04-29: 10 mg via INTRAVENOUS

## 2016-04-29 MED ORDER — GLYCOPYRROLATE 0.2 MG/ML IJ SOLN: 0.2000 mg | Freq: Once | INTRAMUSCULAR | Status: DC | PRN

## 2016-04-29 MED ORDER — ONDANSETRON HCL 4 MG PO TABS: 4.0000 mg | ORAL_TABLET | Freq: Three times a day (TID) | ORAL | 0 refills | Status: DC | PRN

## 2016-04-29 MED ORDER — CLINDAMYCIN PHOSPHATE 900 MG/50ML IV SOLN
INTRAVENOUS | Status: AC
  Filled 2016-04-29: qty 50

## 2016-04-29 MED ORDER — CLINDAMYCIN PHOSPHATE 900 MG/50ML IV SOLN
900.0000 mg | INTRAVENOUS | Status: AC
  Administered 2016-04-29: 900 mg via INTRAVENOUS

## 2016-04-29 MED ORDER — SENNOSIDES-DOCUSATE SODIUM 8.6-50 MG PO TABS: 1.0000 | ORAL_TABLET | Freq: Every evening | ORAL | 1 refills | Status: DC | PRN

## 2016-04-29 MED ORDER — PROPOFOL 10 MG/ML IV BOLUS
INTRAVENOUS | Status: DC | PRN
  Administered 2016-04-29: 200 mg via INTRAVENOUS

## 2016-04-29 MED ORDER — FENTANYL CITRATE (PF) 100 MCG/2ML IJ SOLN
INTRAMUSCULAR | Status: AC
  Filled 2016-04-29: qty 2

## 2016-04-29 SURGICAL SUPPLY — 90 items
1.1 WIRE ×2 IMPLANT
2.0 CANN. DRILL ×2 IMPLANT
2.0 SHORT DRILL ×3 IMPLANT
2.7 x 10 screw ×3 IMPLANT
2.7 x 12 lock screw ×3 IMPLANT
2.7 x 14 screw ×5 IMPLANT
2.7 x 16 screw ×3 IMPLANT
3.0 X 30 cannulated screw ×3 IMPLANT
4.0 x 12 lock screw ×3 IMPLANT
4.0 x 14 ×6 IMPLANT
4.0 x 16 lock screw ×3 IMPLANT
4.0 x 18 lock screw ×3 IMPLANT
BANDAGE ACE 4X5 VEL STRL LF (GAUZE/BANDAGES/DRESSINGS) IMPLANT
BANDAGE ACE 6X5 VEL STRL LF (GAUZE/BANDAGES/DRESSINGS) ×3 IMPLANT
BANDAGE ESMARK 6X9 LF (GAUZE/BANDAGES/DRESSINGS) ×1 IMPLANT
BENZOIN TINCTURE PRP APPL 2/3 (GAUZE/BANDAGES/DRESSINGS) IMPLANT
BLADE HEX COATED 2.75 (ELECTRODE) ×3 IMPLANT
BLADE SURG 15 STRL LF DISP TIS (BLADE) ×2 IMPLANT
BLADE SURG 15 STRL SS (BLADE) ×4
BNDG COHESIVE 3X5 TAN STRL LF (GAUZE/BANDAGES/DRESSINGS) ×3 IMPLANT
BNDG ESMARK 6X9 LF (GAUZE/BANDAGES/DRESSINGS) ×3
CANISTER SUCT 1200ML W/VALVE (MISCELLANEOUS) ×3 IMPLANT
CLOSURE WOUND 1/2 X4 (GAUZE/BANDAGES/DRESSINGS)
COVER BACK TABLE 60X90IN (DRAPES) ×3 IMPLANT
CUFF TOURNIQUET SINGLE 24IN (TOURNIQUET CUFF) IMPLANT
CUFF TOURNIQUET SINGLE 34IN LL (TOURNIQUET CUFF) IMPLANT
DECANTER SPIKE VIAL GLASS SM (MISCELLANEOUS) IMPLANT
DRAPE C-ARM 42X72 X-RAY (DRAPES) ×3 IMPLANT
DRAPE C-ARMOR (DRAPES) ×3 IMPLANT
DRAPE EXTREMITY T 121X128X90 (DRAPE) ×3 IMPLANT
DRAPE IMP U-DRAPE 54X76 (DRAPES) ×3 IMPLANT
DRAPE OEC MINIVIEW 54X84 (DRAPES) ×6 IMPLANT
DRAPE SURG 17X23 STRL (DRAPES) ×6 IMPLANT
DRAPE U-SHAPE 47X51 STRL (DRAPES) IMPLANT
DRSG PAD ABDOMINAL 8X10 ST (GAUZE/BANDAGES/DRESSINGS) ×6 IMPLANT
DURAPREP 26ML APPLICATOR (WOUND CARE) ×3 IMPLANT
ELECT REM PT RETURN 9FT ADLT (ELECTROSURGICAL) ×3
ELECTRODE REM PT RTRN 9FT ADLT (ELECTROSURGICAL) ×1 IMPLANT
GAUZE SPONGE 4X4 12PLY STRL (GAUZE/BANDAGES/DRESSINGS) ×3 IMPLANT
GAUZE SPONGE 4X4 16PLY XRAY LF (GAUZE/BANDAGES/DRESSINGS) IMPLANT
GAUZE XEROFORM 1X8 LF (GAUZE/BANDAGES/DRESSINGS) ×3 IMPLANT
GLOVE SKINSENSE NS SZ7.5 (GLOVE) ×2
GLOVE SKINSENSE STRL SZ7.5 (GLOVE) ×1 IMPLANT
GLOVE SURG SYN 7.5  E (GLOVE) ×2
GLOVE SURG SYN 7.5 E (GLOVE) ×1 IMPLANT
GOWN STRL REIN XL XLG (GOWN DISPOSABLE) ×3 IMPLANT
GOWN STRL REUS W/ TWL LRG LVL3 (GOWN DISPOSABLE) ×1 IMPLANT
GOWN STRL REUS W/TWL LRG LVL3 (GOWN DISPOSABLE) ×2
MTP fusion plate right ×3 IMPLANT
NEEDLE HYPO 22GX1.5 SAFETY (NEEDLE) IMPLANT
NS IRRIG 1000ML POUR BTL (IV SOLUTION) ×3 IMPLANT
PACK BASIN DAY SURGERY FS (CUSTOM PROCEDURE TRAY) ×3 IMPLANT
PAD CAST 3X4 CTTN HI CHSV (CAST SUPPLIES) IMPLANT
PAD CAST 4YDX4 CTTN HI CHSV (CAST SUPPLIES) IMPLANT
PADDING CAST COTTON 3X4 STRL (CAST SUPPLIES)
PADDING CAST COTTON 4X4 STRL (CAST SUPPLIES)
PADDING CAST COTTON 6X4 STRL (CAST SUPPLIES) IMPLANT
PADDING CAST SYN 6 (CAST SUPPLIES)
PADDING CAST SYNTHETIC 4 (CAST SUPPLIES)
PADDING CAST SYNTHETIC 4X4 STR (CAST SUPPLIES) IMPLANT
PADDING CAST SYNTHETIC 6X4 NS (CAST SUPPLIES) IMPLANT
PENCIL BUTTON HOLSTER BLD 10FT (ELECTRODE) ×3 IMPLANT
SHEET MEDIUM DRAPE 40X70 STRL (DRAPES) ×3 IMPLANT
SLEEVE SCD COMPRESS KNEE MED (MISCELLANEOUS) ×3 IMPLANT
SPLINT FAST PLASTER 5X30 (CAST SUPPLIES)
SPLINT FIBERGLASS 3X35 (CAST SUPPLIES) IMPLANT
SPLINT FIBERGLASS 4X30 (CAST SUPPLIES) IMPLANT
SPLINT PLASTER CAST FAST 5X30 (CAST SUPPLIES) IMPLANT
SPONGE LAP 18X18 X RAY DECT (DISPOSABLE) ×3 IMPLANT
STAPLER VISISTAT (STAPLE) IMPLANT
STRIP CLOSURE SKIN 1/2X4 (GAUZE/BANDAGES/DRESSINGS) IMPLANT
SUCTION FRAZIER HANDLE 10FR (MISCELLANEOUS) ×2
SUCTION TUBE FRAZIER 10FR DISP (MISCELLANEOUS) ×1 IMPLANT
SUT ETHILON 3 0 PS 1 (SUTURE) ×3 IMPLANT
SUT MNCRL AB 4-0 PS2 18 (SUTURE) IMPLANT
SUT VIC AB 0 CT1 27 (SUTURE)
SUT VIC AB 0 CT1 27XBRD ANBCTR (SUTURE) IMPLANT
SUT VIC AB 0 SH 27 (SUTURE) ×3 IMPLANT
SUT VIC AB 2-0 CT1 27 (SUTURE) ×2
SUT VIC AB 2-0 CT1 TAPERPNT 27 (SUTURE) ×1 IMPLANT
SUT VIC AB 3-0 PS1 18 (SUTURE) ×2
SUT VIC AB 3-0 PS1 18XBRD (SUTURE) ×1 IMPLANT
SYR BULB 3OZ (MISCELLANEOUS) ×3 IMPLANT
SYR CONTROL 10ML LL (SYRINGE) IMPLANT
TOWEL OR 17X24 6PK STRL BLUE (TOWEL DISPOSABLE) ×3 IMPLANT
TOWEL OR NON WOVEN STRL DISP B (DISPOSABLE) ×3 IMPLANT
TUBE CONNECTING 20'X1/4 (TUBING) ×1
TUBE CONNECTING 20X1/4 (TUBING) ×2 IMPLANT
UNDERPAD 30X30 (UNDERPADS AND DIAPERS) ×3 IMPLANT
YANKAUER SUCT BULB TIP NO VENT (SUCTIONS) ×3 IMPLANT

## 2016-04-29 NOTE — Op Note (Signed)
   Date of Surgery: 04/29/2016  INDICATIONS: [name] is a 50 y.o.-year-old female with a right hallux rigidus with retained hardware;  The patient did consent to the procedure after discussion of the risks and benefits.  PREOPERATIVE DIAGNOSIS:  1. Right hallux rigidus 2. Painful hardware first metatarsal  POSTOPERATIVE DIAGNOSIS: Same.  PROCEDURE:  1. Arthrodesis of right great toe MTP joint 2. Removal of 2 pins from first metatarsal  SURGEON: N. Glee ArvinMichael Jasreet Dickie, M.D.  ASSIST: None.  ANESTHESIA:  general, regional  IV FLUIDS AND URINE: See anesthesia.  ESTIMATED BLOOD LOSS: Minimal mL.  IMPLANTS: [employer]  DRAINS: None  COMPLICATIONS: None.  DESCRIPTION OF PROCEDURE: The patient was brought to the operating room and placed supine on the operating table.  The patient had been signed prior to the procedure and this was documented. The patient had the anesthesia placed by the anesthesiologist.  A time-out was performed to confirm that this was the correct patient, site, side and location. The patient did receive antibiotics prior to the incision and was re-dosed during the procedure as needed at indicated intervals.  A tourniquet was placed.  The patient had the operative extremity prepped and draped in the standard surgical fashion.    A dorsal medial incision was made directly over the big toe and over the first metatarsal. Dissection was carried down to the joint capsule. Cutaneous nerves were identified and protected. Full-thickness flaps were then elevated off of the proximal phalanx and first metatarsal. The 2 pins were identified and removed using pliers. These were removed without difficulty. We then exposed the MTP joint. There were signs of significant arthritis within the joint. The cartilage was denuded using a Rongeur.  The 2 bony ends were then opposed and had good bony contact. I then placed the appropriately sized plate on the dorsal aspect of the first metatarsal and  proximal phalanx. This was confirmed under fluoroscopy. I then placed the MTP joint at the appropriate orientation and affixed the proximal end of the plate to the first metatarsal. I then placed a K wire from the first metatarsal into the proximal phalanx and a proximal medial to lateral distal direction. This was confirmed under fluoroscopy. I then placed a cannulated partially threaded screw in order to gain compression across the joint. I then placed 3 additional screws in the distal end of the plate through the proximal phalanx. Final fluoroscopy pictures were taken. The wound was thoroughly irrigated and closed in layer fashion using 0 Vicryl for the capsule, 3-0 Vicryl for the subcutaneous layer and 3-0 nylon for the skin. Sterile dressings were applied. Foot was placed in a postop shoe patient tolerated the procedure well and no immediate complications.  POSTOPERATIVE PLAN: Nonweightbearing for 2 weeks. Discharge home. Follow up in 2 weeks.  Mayra ReelN. Michael Chioke Noxon, MD [facility] [phone] 12:28 PM

## 2016-04-29 NOTE — Discharge Instructions (Signed)
° ° °  Regional Anesthesia Blocks ° °1. Numbness or the inability to move the "blocked" extremity may last from 3-48 hours after placement. The length of time depends on the medication injected and your individual response to the medication. If the numbness is not going away after 48 hours, call your surgeon. ° °2. The extremity that is blocked will need to be protected until the numbness is gone and the  Strength has returned. Because you cannot feel it, you will need to take extra care to avoid injury. Because it may be weak, you may have difficulty moving it or using it. You may not know what position it is in without looking at it while the block is in effect. ° °3. For blocks in the legs and feet, returning to weight bearing and walking needs to be done carefully. You will need to wait until the numbness is entirely gone and the strength has returned. You should be able to move your leg and foot normally before you try and bear weight or walk. You will need someone to be with you when you first try to ensure you do not fall and possibly risk injury. ° °4. Bruising and tenderness at the needle site are common side effects and will resolve in a few days. ° °5. Persistent numbness or new problems with movement should be communicated to the surgeon or the [facility] ([phone])/ [facility] ([phone]). ° ° ° °Post Anesthesia Home Care Instructions ° °Activity: °Get plenty of rest for the remainder of the day. A responsible adult should stay with you for 24 hours following the procedure.  °For the next 24 hours, DO NOT: °-Drive a car °-Operate machinery °-Drink alcoholic beverages °-Take any medication unless instructed by your physician °-Make any legal decisions or sign important papers. ° °Meals: °Start with liquid foods such as gelatin or soup. Progress to regular foods as tolerated. Avoid greasy, spicy, heavy foods. If nausea and/or vomiting occur, drink only clear liquids until  the nausea and/or vomiting subsides. Call your physician if vomiting continues. ° °Special Instructions/Symptoms: °Your throat may feel dry or sore from the anesthesia or the breathing tube placed in your throat during surgery. If this causes discomfort, gargle with warm salt water. The discomfort should disappear within 24 hours. ° °If you had a scopolamine patch placed behind your ear for the management of post- operative nausea and/or vomiting: ° °1. The medication in the patch is effective for 72 hours, after which it should be removed.  Wrap patch in a tissue and discard in the trash. Wash hands thoroughly with soap and water. °2. You may remove the patch earlier than 72 hours if you experience unpleasant side effects which may include dry mouth, dizziness or visual disturbances. °3. Avoid touching the patch. Wash your hands with soap and water after contact with the patch. °  ° °

## 2016-04-29 NOTE — Transfer of Care (Signed)
Immediate Anesthesia Transfer of Care Note  Patient: Amanda [name]  Procedure(s) Performed: Procedure(s) with comments: RIGHT GREAT TOE METATARSOPHALANGEAL JOINT (MTPJ) FUSION, HARDWARE REMOVAL (Right) - RIGHT GREAT TOE METATARSOPHALANGEAL JOINT (MTPJ) FUSION, HARDWARE REMOVAL HARDWARE REMOVAL (Right) - HARDWARE REMOVAL  Patient Location: PACU  Anesthesia Type:GA combined with regional for post-op pain  Level of Consciousness: awake and patient cooperative  Airway & Oxygen Therapy: Patient Spontanous Breathing and Patient connected to face mask oxygen  Post-op Assessment: Report given to RN and Post -op Vital signs reviewed and stable  Post vital signs: Reviewed and stable  Last Vitals:  Vitals:   04/29/16 1040 04/29/16 1230  BP: (!) 155/99 132/78  Pulse: 77 70  Resp:  15  Temp:  36.6 C    Last Pain:  Vitals:   04/29/16 0927  TempSrc: Oral  PainSc: 7          Complications: No apparent anesthesia complications

## 2016-04-29 NOTE — Anesthesia Procedure Notes (Signed)
Anesthesia Regional Block:  Popliteal block  Pre-Anesthetic Checklist: ,, timeout performed, Correct Patient, Correct Site, Correct Laterality, Correct Procedure, Correct Position, site marked, Risks and benefits discussed,  Surgical consent,  Pre-op evaluation,  At surgeon's request and post-op pain management  Laterality: Right  Prep: Maximum Sterile Barrier Precautions used, chloraprep       Needles:  Injection technique: Single-shot  Needle Type: Echogenic Stimulator Needle     Needle Length: 10cm 10 cm Needle Gauge: 21 G    Additional Needles:  Procedures: ultrasound guided (picture in chart) Popliteal block Narrative:  Injection made incrementally with aspirations every 5 mL.  Performed by: Personally   Additional Notes: Patient tolerated the procedure well without complications      

## 2016-04-29 NOTE — H&P (Signed)
PREOPERATIVE H&P  Chief Complaint: right hallux rigidus, retained hardware  HPI: Amanda [name] is a 50 y.o. female who presents for surgical treatment of right hallux rigidus, retained hardware.  She denies any changes in medical history.  Past Medical History:  Diagnosis Date  . Anxiety   . Arthritis    lower back  . Asthma   . Bipolar disorder (HCC)   . Carpal tunnel syndrome, bilateral   . Chronic back pain   . Depression   . Fx. left wrist   . GERD (gastroesophageal reflux disease)   . Heart murmur    "born with"  . Hypertension   . IBS (irritable bowel syndrome)   . Muscle spasms of neck   . Neuropathy (HCC)   . PUD (peptic ulcer disease)    Past Surgical History:  Procedure Laterality Date  . CARPAL TUNNEL RELEASE Right 2003  . CHOLECYSTECTOMY N/A 02/09/2014   Procedure: LAPAROSCOPIC CHOLECYSTECTOMY WITH INTRAOPERATIVE CHOLANGIOGRAM;  Surgeon: Valarie Merino, MD;  Location: [facility];  Service: General;  Laterality: N/A;  . HAMMER TOE FUSION Bilateral 2008  . ROTATOR CUFF REPAIR Right 2011  . WRIST FRACTURE SURGERY Left    Social History   Social History  . Marital status: Single    Spouse name: N/A  . Number of children: 0  . Years of education: 12   Occupational History  . [name]   Social History Main Topics  . Smoking status: Never Smoker  . Smokeless tobacco: Never Used  . Alcohol use No  . Drug use: Unknown    Types: "Crack" cocaine     Comment: 07/24/15 patient states she has not used in 3 weeks  . Sexual activity: No   Other Topics Concern  . None   Social History Narrative   Pt lives at home alone. She has a HS education level and does not have any children.    She drinks 2 cups of caffeine daily.   Family History  Problem Relation Age of Onset  . Pancreatic cancer Mother   . Heart failure Father   . Diabetes Father   . Breast cancer Paternal Grandmother   . HIV/AIDS Brother   . Other Sister     Brain tumor  . Colon  cancer Neg Hx    Allergies  Allergen Reactions  . Penicillins Anaphylaxis    Has patient had a PCN reaction causing immediate rash, facial/tongue/throat swelling, SOB or lightheadedness with hypotension: Yes Has patient had a PCN reaction causing severe rash involving mucus membranes or skin necrosis: Yes Has patient had a PCN reaction that required hospitalization Yes Has patient had a PCN reaction occurring within the last 10 years: No-more than 10 years ago If all of the above answers are "NO", then may proceed with Cephalosporin use.   Jonne Ply [Aspirin] Rash   Prior to Admission medications   Medication Sig Start Date End Date Taking? Authorizing Provider  atenolol (TENORMIN) 50 MG tablet Take 1 tablet (50 mg total) by mouth 2 (two) times daily. 01/24/16  Yes Jaclyn Shaggy, MD  cetirizine (ZYRTEC) 10 MG tablet Take 1 tablet (10 mg total) by mouth daily. 01/24/16  Yes Jaclyn Shaggy, MD  cloNIDine (CATAPRES) 0.1 MG tablet Take 1 tablet (0.1 mg total) by mouth at bedtime. 01/24/16  Yes Jaclyn Shaggy, MD  cyclobenzaprine (FLEXERIL) 10 MG tablet TAKE 1 TABLET BY MOUTH TWICE DAILY. 12/09/15  Yes Jaclyn Shaggy, MD  dicyclomine (BENTYL)  10 MG capsule Bentyl 20 mg   1 three times a day. 04/15/16  Yes Beverley FiedlerJay M Pyrtle, MD  divalproex (DEPAKOTE) 500 MG DR tablet Take 500 mg by mouth 3 (three) times daily.   Yes Historical Provider, MD  gabapentin (NEURONTIN) 800 MG tablet Take 1 tablet by mouth 2 (two) times daily.  02/13/16  Yes Historical Provider, MD  haloperidol (HALDOL) 5 MG tablet Take 1 tablet by mouth daily. 02/13/16  Yes Historical Provider, MD  hydrochlorothiazide (MICROZIDE) 12.5 MG capsule TAKE 1 CAPSULE BY MOUTH DAILY AS NEEDED FOR PEDAL EDEMA 04/10/16  Yes Jaclyn ShaggyEnobong Amao, MD  hydrOXYzine (ATARAX/VISTARIL) 50 MG tablet Take 1 tablet (50 mg total) by mouth 3 (three) times daily as needed for anxiety or itching. 10/23/15  Yes Jaclyn ShaggyEnobong Amao, MD  mirtazapine (REMERON) 30 MG tablet Take 45 mg by mouth at bedtime.    Yes Historical Provider, MD  olopatadine (PATANOL) 0.1 % ophthalmic solution Place 1 drop into both eyes 2 (two) times daily. 11/19/15  Yes Jaclyn ShaggyEnobong Amao, MD  topiramate (TOPAMAX) 50 MG tablet Take 1 tablet (50 mg total) by mouth 2 (two) times daily. Patient 01/24/16  Yes Jaclyn ShaggyEnobong Amao, MD  traMADol (ULTRAM) 50 MG tablet Take 1 tablet (50 mg total) by mouth every 8 (eight) hours as needed. 01/30/16  Yes Vivi BarrackMatthew R Wagoner, DPM  traZODone (DESYREL) 300 MG tablet Take 1 tablet (300 mg total) by mouth at bedtime. 11/21/14  Yes Adonis BrookSheila Agustin, NP  VENTOLIN HFA 108 (90 Base) MCG/ACT inhaler INHALE 1-2 PUFFS INTO THE LUNGS EVERY 6 HOURS AS NEEDED FOR WHEEZING OR SHORTNESS OF BREATH. 04/06/16  Yes Jaclyn ShaggyEnobong Amao, MD  hydrOXYzine (VISTARIL) 50 MG capsule Take 1 capsule by mouth daily. 03/11/16   Historical Provider, MD     Positive ROS: All other systems have been reviewed and were otherwise negative with the exception of those mentioned in the HPI and as above.  Physical Exam: General: Alert, no acute distress Cardiovascular: No pedal edema Respiratory: No cyanosis, no use of accessory musculature GI: abdomen soft Skin: No lesions in the area of chief complaint Neurologic: Sensation intact distally Psychiatric: Patient is competent for consent with normal mood and affect Lymphatic: no lymphedema  MUSCULOSKELETAL: exam stable  Assessment: right hallux rigidus, retained hardware  Plan: Plan for Procedure(s): RIGHT GREAT TOE METATARSOPHALANGEAL JOINT (MTPJ) FUSION, HARDWARE REMOVAL HARDWARE REMOVAL  The risks benefits and alternatives were discussed with the patient including but not limited to the risks of nonoperative treatment, versus surgical intervention including infection, bleeding, nerve injury,  blood clots, cardiopulmonary complications, morbidity, mortality, among others, and they were willing to proceed.   Cheral AlmasXu, Nicholas Ossa Michael, MD   04/29/2016 8:38 AM ]

## 2016-04-29 NOTE — Progress Notes (Signed)
Assisted Dr. Mary Judd with right, ultrasound guided, popliteal block. Side rails up, monitors on throughout procedure. See vital signs in flow sheet. Tolerated Procedure well. 

## 2016-04-29 NOTE — Anesthesia Procedure Notes (Signed)
Procedure Name: LMA Insertion Date/Time: 04/29/2016 11:05 AM Performed by: Burna CashONRAD, Ellias Mcelreath C Pre-anesthesia Checklist: Patient identified, Emergency Drugs available, Suction available and Patient being monitored Patient Re-evaluated:Patient Re-evaluated prior to inductionOxygen Delivery Method: Circle system utilized Preoxygenation: Pre-oxygenation with 100% oxygen Intubation Type: IV induction Ventilation: Mask ventilation without difficulty LMA: LMA inserted LMA Size: 4.0 Number of attempts: 1 Airway Equipment and Method: Bite block Placement Confirmation: positive ETCO2 Tube secured with: Tape Dental Injury: Teeth and Oropharynx as per pre-operative assessment

## 2016-04-29 NOTE — Anesthesia Postprocedure Evaluation (Signed)
Anesthesia Post Note  Patient: Amanda [name]  Procedure(s) Performed: Procedure(s) (LRB): RIGHT GREAT TOE METATARSOPHALANGEAL JOINT (MTPJ) FUSION, HARDWARE REMOVAL (Right) HARDWARE REMOVAL (Right)  Patient location during evaluation: PACU Anesthesia Type: General Level of consciousness: awake and alert Pain management: pain level controlled Vital Signs Assessment: post-procedure vital signs reviewed and stable Respiratory status: spontaneous breathing, nonlabored ventilation, respiratory function stable and patient connected to nasal cannula oxygen Cardiovascular status: blood pressure returned to baseline and stable Postop Assessment: no signs of nausea or vomiting Anesthetic complications: no    Last Vitals:  Vitals:   04/29/16 1230 04/29/16 1245  BP: 132/78 (!) 146/96  Pulse: 70 79  Resp: 15 17  Temp: 36.6 C     Last Pain:  Vitals:   04/29/16 1245  TempSrc:   PainSc: 0-No pain                 Chamara Dyck JENNETTE

## 2016-04-29 NOTE — H&P (Signed)
H&P update  The surgical history has been reviewed and remains accurate without interval change.  The patient was re-examined and patient's physiologic condition has not changed significantly in the last 30 days. The condition still exists that makes this procedure necessary. The treatment plan remains the same, without new options for care.  No new pharmacological allergies or types of therapy has been initiated that would change the plan or the appropriateness of the plan.  The patient and/or family understand the potential benefits and risks.  Mayra ReelN. Michael Sony Schlarb, MD 04/29/2016 10:43 AM

## 2016-04-30 ENCOUNTER — Other Ambulatory Visit: Payer: Self-pay | Admitting: Family Medicine

## 2016-04-30 MED FILL — VENTOLIN HFA 90 MCG INHALER: 108 (90 BAS | 25 days supply | Qty: 18 | Fill #0

## 2016-05-01 ENCOUNTER — Encounter (HOSPITAL_BASED_OUTPATIENT_CLINIC_OR_DEPARTMENT_OTHER): Payer: Self-pay | Admitting: Orthopaedic Surgery

## 2016-05-08 ENCOUNTER — Other Ambulatory Visit: Payer: Self-pay | Admitting: Family Medicine

## 2016-05-08 MED FILL — HYDROXYZINE PAM 50 MG CAP: 50 | 30 days supply | Qty: 120 | Fill #1

## 2016-05-08 MED FILL — ?HYDROCHLOROTHIAZIDE 12.5MG: 12.5 | 30 days supply | Qty: 30 | Fill #0

## 2016-05-12 ENCOUNTER — Other Ambulatory Visit: Payer: Self-pay | Admitting: Family Medicine

## 2016-05-12 MED ORDER — METOPROLOL TARTRATE 50 MG PO TABS: 50.0000 mg | ORAL_TABLET | Freq: Two times a day (BID) | ORAL | 3 refills | Status: DC

## 2016-05-12 NOTE — Progress Notes (Signed)
Atenolol substituted with metoprolol tartrate to the former being on back order.

## 2016-05-13 MED FILL — ?METOPROLOL 50 MG TABLET: 50 | 30 days supply | Qty: 60 | Fill #0

## 2016-05-15 ENCOUNTER — Encounter: Payer: Self-pay | Admitting: Family Medicine

## 2016-05-15 ENCOUNTER — Ambulatory Visit: Payer: Self-pay | Attending: Family Medicine | Admitting: Family Medicine

## 2016-05-15 VITALS — BP 112/75 | HR 72 | Temp 98.1°F | Resp 17 | Wt 211.6 lb

## 2016-05-15 DIAGNOSIS — F3162 Bipolar disorder, current episode mixed, moderate: Secondary | ICD-10-CM

## 2016-05-15 DIAGNOSIS — G43709 Chronic migraine without aura, not intractable, without status migrainosus: Secondary | ICD-10-CM

## 2016-05-15 DIAGNOSIS — I10 Essential (primary) hypertension: Secondary | ICD-10-CM

## 2016-05-15 DIAGNOSIS — K219 Gastro-esophageal reflux disease without esophagitis: Secondary | ICD-10-CM

## 2016-05-15 DIAGNOSIS — G629 Polyneuropathy, unspecified: Secondary | ICD-10-CM

## 2016-05-15 MED ORDER — GABAPENTIN 800 MG PO TABS: 800.0000 mg | ORAL_TABLET | Freq: Two times a day (BID) | ORAL | 5 refills | Status: DC

## 2016-05-15 MED ORDER — HYDROCHLOROTHIAZIDE 12.5 MG PO CAPS: ORAL_CAPSULE | ORAL | 5 refills | Status: DC

## 2016-05-15 MED ORDER — OMEPRAZOLE 40 MG PO CPDR: 40.0000 mg | DELAYED_RELEASE_CAPSULE | Freq: Every day | ORAL | 5 refills | Status: DC

## 2016-05-15 MED ORDER — TOPIRAMATE 50 MG PO TABS: 50.0000 mg | ORAL_TABLET | Freq: Two times a day (BID) | ORAL | 5 refills | Status: DC

## 2016-05-15 MED FILL — TOPIRAMATE 50 MG TABLET: 50 | 30 days supply | Qty: 60 | Fill #0

## 2016-05-15 MED FILL — OMEPRAZOLE DR 40 MG CAPSULE: 40 | 30 days supply | Qty: 30 | Fill #0

## 2016-05-15 NOTE — Progress Notes (Signed)
Subjective:  Patient ID: Amanda [name], female    DOB: 12-18-1965  Age: 50 y.o. MRN: [id number]  CC: Hypertension and Follow-up (surgery clearance )   HPI Amanda [name] is a 51 year old female with a history of hypertension, bipolar disorder, neuropathy, chronic low back pain, PUD/GERD, carpal tunnel syndrome who comes in For a follow-up visit.  She recently had right foot surgery on 04/28/16 by Dr.XU and has had her postoperative visit. Reports improvement in foot symptoms. Complains of persistent epigastric pain despite taking her PPI; scheduled to see her gastroenterologist next month. Denies nausea, vomiting, constipation or diarrhea.  Mental health is managed at [facility]. She is requesting refills of her chronic medications   Past Medical History:  Diagnosis Date  . Anxiety   . Arthritis    lower back  . Asthma   . Bipolar disorder (HCC)   . Carpal tunnel syndrome, bilateral   . Chronic back pain   . Depression   . Fx. left wrist   . GERD (gastroesophageal reflux disease)   . Heart murmur    "born with"  . Hypertension   . IBS (irritable bowel syndrome)   . Muscle spasms of neck   . Neuropathy (HCC)   . PUD (peptic ulcer disease)     Past Surgical History:  Procedure Laterality Date  . ARTHRODESIS METATARSALPHALANGEAL JOINT (MTPJ) Right 04/29/2016   Procedure: RIGHT GREAT TOE METATARSOPHALANGEAL JOINT (MTPJ) FUSION, HARDWARE REMOVAL;  Surgeon: Amanda Kos, MD;  Location: [facility];  Service: Orthopedics;  Laterality: Right;  RIGHT GREAT TOE METATARSOPHALANGEAL JOINT (MTPJ) FUSION, HARDWARE REMOVAL  . CARPAL TUNNEL RELEASE Right 2003  . CHOLECYSTECTOMY N/A 02/09/2014   Procedure: LAPAROSCOPIC CHOLECYSTECTOMY WITH INTRAOPERATIVE CHOLANGIOGRAM;  Surgeon: Amanda Merino, MD;  Location: [facility];  Service: General;  Laterality: N/A;  . HAMMER TOE FUSION Bilateral 2008  . HARDWARE REMOVAL Right 04/29/2016   Procedure: HARDWARE REMOVAL;  Surgeon: Amanda Kos, MD;  Location: [facility];  Service: Orthopedics;  Laterality: Right;  HARDWARE REMOVAL  . ROTATOR CUFF REPAIR Right 2011  . WRIST FRACTURE SURGERY Left      Outpatient Medications Prior to Visit  Medication Sig Dispense Refill  . cetirizine (ZYRTEC) 10 MG tablet TAKE 1 TABLET BY MOUTH DAILY. 30 tablet 2  . cloNIDine (CATAPRES) 0.1 MG tablet Take 1 tablet (0.1 mg total) by mouth at bedtime. 60 tablet 2  . cyclobenzaprine (FLEXERIL) 10 MG tablet TAKE 1 TABLET BY MOUTH TWICE DAILY. 60 tablet 2  . dicyclomine (BENTYL) 10 MG capsule Bentyl 20 mg   1 three times a day. 90 capsule 2  . divalproex (DEPAKOTE) 500 MG DR tablet Take 500 mg by mouth 3 (three) times daily.    . haloperidol (HALDOL) 5 MG tablet Take 1 tablet by mouth daily.  0  . hydrOXYzine (ATARAX/VISTARIL) 50 MG tablet Take 1 tablet (50 mg total) by mouth 3 (three) times daily as needed for anxiety or itching. 90 tablet 2  . hydrOXYzine (VISTARIL) 50 MG capsule Take 1 capsule by mouth daily.  1  . metoprolol (LOPRESSOR) 50 MG tablet Take 1 tablet (50 mg total) by mouth 2 (two) times daily. 60 tablet 3  . mirtazapine (REMERON) 30 MG tablet Take 45 mg by mouth at bedtime.    Marland Kitchen olopatadine (PATANOL) 0.1 % ophthalmic solution Place 1 drop into both eyes 2 (two) times daily. 5 mL 2  . ondansetron (ZOFRAN) 4 MG tablet Take  1-2 tablets (4-8 mg total) by mouth every 8 (eight) hours as needed for nausea or vomiting. 40 tablet 0  . traMADol (ULTRAM) 50 MG tablet Take 1 tablet (50 mg total) by mouth every 8 (eight) hours as needed. 6 tablet 0  . traZODone (DESYREL) 300 MG tablet Take 1 tablet (300 mg total) by mouth at bedtime. 30 tablet 0  . VENTOLIN HFA 108 (90 Base) MCG/ACT inhaler INHALE 1-2 PUFFS INTO THE LUNGS EVERY 6 HOURS AS NEEDED FOR WHEEZING OR SHORTNESS OF BREATH. 18 g 0  . gabapentin (NEURONTIN) 800 MG tablet Take 1 tablet by mouth 2 (two) times daily.   0  . hydrochlorothiazide (MICROZIDE) 12.5 MG capsule TAKE 1  CAPSULE BY MOUTH DAILY AS NEEDED FOR PEDAL EDEMA 30 capsule 0  . senna-docusate (SENOKOT S) 8.6-50 MG tablet Take 1 tablet by mouth at bedtime as needed. 30 tablet 1  . topiramate (TOPAMAX) 50 MG tablet Take 1 tablet (50 mg total) by mouth 2 (two) times daily. Patient 60 tablet 2  . oxyCODONE (OXY IR/ROXICODONE) 5 MG immediate release tablet Take 1-3 tablets (5-15 mg total) by mouth every 4 (four) hours as needed. (Patient not taking: Reported on 05/15/2016) 90 tablet 0  . oxyCODONE (OXYCONTIN) 10 mg 12 hr tablet Take 1 tablet (10 mg total) by mouth every 12 (twelve) hours. (Patient not taking: Reported on 05/15/2016) 10 tablet 0   No facility-administered medications prior to visit.     ROS Review of Systems  Constitutional: Negative for activity change, appetite change and fatigue.  HENT: Negative for congestion, sinus pressure and sore throat.   Eyes: Negative for visual disturbance.  Respiratory: Negative for cough, chest tightness, shortness of breath and wheezing.   Cardiovascular: Negative for chest pain and palpitations.  Gastrointestinal: Positive for abdominal pain. Negative for abdominal distention and constipation.  Endocrine: Negative for polydipsia.  Genitourinary: Negative for dysuria and frequency.  Musculoskeletal:       See history of present illness  Skin: Negative for rash.  Neurological: Negative for tremors, light-headedness and numbness.  Hematological: Does not bruise/bleed easily.  Psychiatric/Behavioral: Negative for agitation and behavioral problems.    Objective:  BP 112/75 (BP Location: Right Arm, Patient Position: Sitting, Cuff Size: Large)   Pulse 72   Temp 98.1 F (36.7 C) (Oral)   Resp 17   Wt 211 lb 9.6 oz (96 kg) Comment: with boot on foot  LMP 05/14/2016 (Exact Date)   SpO2 97%   BMI 34.15 kg/m   BP/Weight 05/15/2016 04/29/2016 04/15/2016  Systolic BP 112 140 158  Diastolic BP 75 88 81  Wt. (Lbs) 211.6 211.6 209  BMI 34.15 34.15 33.73  Some  encounter information is confidential and restricted. Go to Review Flowsheets activity to see all data.      Physical Exam  Constitutional: She is oriented to person, place, and time. She appears well-developed and well-nourished.  Cardiovascular: Normal rate, normal heart sounds and intact distal pulses.   No murmur heard. Pulmonary/Chest: Effort normal and breath sounds normal. She has no wheezes. She has no rales. She exhibits no tenderness.  Abdominal: Soft. Bowel sounds are normal. She exhibits no distension and no mass. There is no tenderness.  Musculoskeletal:  Right foot with vertical surgical scar on the dorsum which is healing well  Neurological: She is alert and oriented to person, place, and time.     Assessment & Plan:   1. Bipolar disorder, current episode mixed, moderate (HCC) Managed by mental health  2. Gastroesophageal reflux disease without esophagitis Uncontrolled She sees GI next month - omeprazole (PRILOSEC) 40 MG capsule; Take 1 capsule (40 mg total) by mouth daily.  Dispense: 30 capsule; Refill: 5  3. Essential hypertension Controlled - hydrochlorothiazide (MICROZIDE) 12.5 MG capsule; TAKE 1 CAPSULE BY MOUTH DAILY AS NEEDED FOR PEDAL EDEMA  Dispense: 30 capsule; Refill: 5  4. Neuropathy (HCC) Stable - gabapentin (NEURONTIN) 800 MG tablet; Take 1 tablet (800 mg total) by mouth 2 (two) times daily.  Dispense: 60 tablet; Refill: 5  5. Chronic migraine without aura without status migrainosus, not intractable Controlled - topiramate (TOPAMAX) 50 MG tablet; Take 1 tablet (50 mg total) by mouth 2 (two) times daily. Patient  Dispense: 60 tablet; Refill: 5   Meds ordered this encounter  Medications  . gabapentin (NEURONTIN) 800 MG tablet    Sig: Take 1 tablet (800 mg total) by mouth 2 (two) times daily.    Dispense:  60 tablet    Refill:  5  . topiramate (TOPAMAX) 50 MG tablet    Sig: Take 1 tablet (50 mg total) by mouth 2 (two) times daily. Patient     Dispense:  60 tablet    Refill:  5  . hydrochlorothiazide (MICROZIDE) 12.5 MG capsule    Sig: TAKE 1 CAPSULE BY MOUTH DAILY AS NEEDED FOR PEDAL EDEMA    Dispense:  30 capsule    Refill:  5  . omeprazole (PRILOSEC) 40 MG capsule    Sig: Take 1 capsule (40 mg total) by mouth daily.    Dispense:  30 capsule    Refill:  5    Follow-up: Return in about 3 weeks (around 06/05/2016) for complete physical exam.   Jaclyn Shaggy MD

## 2016-05-15 NOTE — Progress Notes (Signed)
Pt is in today for surgery clearance  Pt is in for hypertension  Pt confirms pain in right foot  Pt needs refills on blood pressure, stomach meds and gabapentin  Pt declines flu vaccine today

## 2016-05-20 ENCOUNTER — Other Ambulatory Visit: Payer: Self-pay | Admitting: Family Medicine

## 2016-05-20 MED FILL — CYCLOBENZAPRINE 10 MG TAB: 10 | 30 days supply | Qty: 60 | Fill #1

## 2016-05-20 MED FILL — GABAPENTIN 800 MG TABLET: 800 | 30 days supply | Qty: 120 | Fill #1

## 2016-05-20 MED FILL — traZODone HCL 150 MG TABS: 150 | 30 days supply | Qty: 60 | Fill #0

## 2016-05-20 MED FILL — MIRTAZAPINE 30 MG TABLET: 30 | 30 days supply | Qty: 30 | Fill #1

## 2016-05-20 MED FILL — DIVALPROEX SOD DR 500 MG TA: 500 | 30 days supply | Qty: 90 | Fill #0

## 2016-05-21 NOTE — Progress Notes (Signed)
Patient called to let patient know that the tramadol prescription is ready for pick up.  Patient states that she will be in tomorrow to pick it up.

## 2016-05-28 MED FILL — traMADol HCL 50 MG TABS: 50 | 30 days supply | Qty: 60 | Fill #0

## 2016-05-28 MED FILL — ALL DAY ALLERGY 10 MG TAB: 10 | 30 days supply | Qty: 30 | Fill #0

## 2016-06-05 ENCOUNTER — Encounter: Payer: Self-pay | Admitting: Family Medicine

## 2016-06-05 ENCOUNTER — Ambulatory Visit: Payer: Self-pay | Attending: Family Medicine | Admitting: Family Medicine

## 2016-06-05 VITALS — BP 122/79 | HR 77 | Temp 97.5°F | Ht 68.0 in | Wt 215.6 lb

## 2016-06-05 DIAGNOSIS — Z1231 Encounter for screening mammogram for malignant neoplasm of breast: Secondary | ICD-10-CM

## 2016-06-05 DIAGNOSIS — G629 Polyneuropathy, unspecified: Secondary | ICD-10-CM | POA: Insufficient documentation

## 2016-06-05 DIAGNOSIS — M545 Low back pain: Secondary | ICD-10-CM | POA: Insufficient documentation

## 2016-06-05 DIAGNOSIS — Z124 Encounter for screening for malignant neoplasm of cervix: Secondary | ICD-10-CM

## 2016-06-05 DIAGNOSIS — I10 Essential (primary) hypertension: Secondary | ICD-10-CM | POA: Insufficient documentation

## 2016-06-05 DIAGNOSIS — G8929 Other chronic pain: Secondary | ICD-10-CM | POA: Insufficient documentation

## 2016-06-05 DIAGNOSIS — Z79899 Other long term (current) drug therapy: Secondary | ICD-10-CM | POA: Insufficient documentation

## 2016-06-05 DIAGNOSIS — F419 Anxiety disorder, unspecified: Secondary | ICD-10-CM | POA: Insufficient documentation

## 2016-06-05 DIAGNOSIS — Z1239 Encounter for other screening for malignant neoplasm of breast: Secondary | ICD-10-CM

## 2016-06-05 DIAGNOSIS — Z1159 Encounter for screening for other viral diseases: Secondary | ICD-10-CM

## 2016-06-05 DIAGNOSIS — K219 Gastro-esophageal reflux disease without esophagitis: Secondary | ICD-10-CM | POA: Insufficient documentation

## 2016-06-05 DIAGNOSIS — F319 Bipolar disorder, unspecified: Secondary | ICD-10-CM | POA: Insufficient documentation

## 2016-06-05 DIAGNOSIS — J45909 Unspecified asthma, uncomplicated: Secondary | ICD-10-CM | POA: Insufficient documentation

## 2016-06-05 DIAGNOSIS — K589 Irritable bowel syndrome without diarrhea: Secondary | ICD-10-CM | POA: Insufficient documentation

## 2016-06-05 DIAGNOSIS — Z0000 Encounter for general adult medical examination without abnormal findings: Secondary | ICD-10-CM

## 2016-06-05 DIAGNOSIS — Z8711 Personal history of peptic ulcer disease: Secondary | ICD-10-CM | POA: Insufficient documentation

## 2016-06-05 NOTE — Patient Instructions (Signed)
Health Maintenance, Female Adopting a healthy lifestyle and getting preventive care can go a long way to promote health and wellness. Talk with your health care provider about what schedule of regular examinations is right for you. This is a good chance for you to check in with your provider about disease prevention and staying healthy. In between checkups, there are plenty of things you can do on your own. Experts have done a lot of research about which lifestyle changes and preventive measures are most likely to keep you healthy. Ask your health care provider for more information. WEIGHT AND DIET  Eat a healthy diet  Be sure to include plenty of vegetables, fruits, low-fat dairy products, and lean protein.  Do not eat a lot of foods high in solid fats, added sugars, or salt.  Get regular exercise. This is one of the most important things you can do for your health.  Most adults should exercise for at least 150 minutes each week. The exercise should increase your heart rate and make you sweat (moderate-intensity exercise).  Most adults should also do strengthening exercises at least twice a week. This is in addition to the moderate-intensity exercise.  Maintain a healthy weight  Body mass index (BMI) is a measurement that can be used to identify possible weight problems. It estimates body fat based on height and weight. Your health care provider can help determine your BMI and help you achieve or maintain a healthy weight.  For females 20 years of age and older:   A BMI below 18.5 is considered underweight.  A BMI of 18.5 to 24.9 is normal.  A BMI of 25 to 29.9 is considered overweight.  A BMI of 30 and above is considered obese.  Watch levels of cholesterol and blood lipids  You should start having your blood tested for lipids and cholesterol at 51 years of age, then have this test every 5 years.  You may need to have your cholesterol levels checked more often if:  Your lipid  or cholesterol levels are high.  You are older than 50 years of age.  You are at high risk for heart disease.  CANCER SCREENING   Lung Cancer  Lung cancer screening is recommended for adults 55-80 years old who are at high risk for lung cancer because of a history of smoking.  A yearly low-dose CT scan of the lungs is recommended for people who:  Currently smoke.  Have quit within the past 15 years.  Have at least a 30-pack-year history of smoking. A pack year is smoking an average of one pack of cigarettes a day for 1 year.  Yearly screening should continue until it has been 15 years since you quit.  Yearly screening should stop if you develop a health problem that would prevent you from having lung cancer treatment.  Breast Cancer  Practice breast self-awareness. This means understanding how your breasts normally appear and feel.  It also means doing regular breast self-exams. Let your health care provider know about any changes, no matter how small.  If you are in your 20s or 30s, you should have a clinical breast exam (CBE) by a health care provider every 1-3 years as part of a regular health exam.  If you are 40 or older, have a CBE every year. Also consider having a breast X-ray (mammogram) every year.  If you have a family history of breast cancer, talk to your health care provider about genetic screening.  If you   are at high risk for breast cancer, talk to your health care provider about having an MRI and a mammogram every year.  Breast cancer gene (BRCA) assessment is recommended for women who have family members with BRCA-related cancers. BRCA-related cancers include:  Breast.  Ovarian.  Tubal.  Peritoneal cancers.  Results of the assessment will determine the need for genetic counseling and BRCA1 and BRCA2 testing. Cervical Cancer Your health care provider may recommend that you be screened regularly for cancer of the pelvic organs (ovaries, uterus, and  vagina). This screening involves a pelvic examination, including checking for microscopic changes to the surface of your cervix (Pap test). You may be encouraged to have this screening done every 3 years, beginning at age 21.  For women ages 30-65, health care providers may recommend pelvic exams and Pap testing every 3 years, or they may recommend the Pap and pelvic exam, combined with testing for human papilloma virus (HPV), every 5 years. Some types of HPV increase your risk of cervical cancer. Testing for HPV may also be done on women of any age with unclear Pap test results.  Other health care providers may not recommend any screening for nonpregnant women who are considered low risk for pelvic cancer and who do not have symptoms. Ask your health care provider if a screening pelvic exam is right for you.  If you have had past treatment for cervical cancer or a condition that could lead to cancer, you need Pap tests and screening for cancer for at least 20 years after your treatment. If Pap tests have been discontinued, your risk factors (such as having a new sexual partner) need to be reassessed to determine if screening should resume. Some women have medical problems that increase the chance of getting cervical cancer. In these cases, your health care provider may recommend more frequent screening and Pap tests. Colorectal Cancer  This type of cancer can be detected and often prevented.  Routine colorectal cancer screening usually begins at 50 years of age and continues through 51 years of age.  Your health care provider may recommend screening at an earlier age if you have risk factors for colon cancer.  Your health care provider may also recommend using home test kits to check for hidden blood in the stool.  A small camera at the end of a tube can be used to examine your colon directly (sigmoidoscopy or colonoscopy). This is done to check for the earliest forms of colorectal  cancer.  Routine screening usually begins at age 50.  Direct examination of the colon should be repeated every 5-10 years through 51 years of age. However, you may need to be screened more often if early forms of precancerous polyps or small growths are found. Skin Cancer  Check your skin from head to toe regularly.  Tell your health care provider about any new moles or changes in moles, especially if there is a change in a mole's shape or color.  Also tell your health care provider if you have a mole that is larger than the size of a pencil eraser.  Always use sunscreen. Apply sunscreen liberally and repeatedly throughout the day.  Protect yourself by wearing long sleeves, pants, a wide-brimmed hat, and sunglasses whenever you are outside. HEART DISEASE, DIABETES, AND HIGH BLOOD PRESSURE   High blood pressure causes heart disease and increases the risk of stroke. High blood pressure is more likely to develop in:  People who have blood pressure in the high end   of the normal range (130-139/85-89 mm Hg).  People who are overweight or obese.  People who are African American.  If you are 38-23 years of age, have your blood pressure checked every 3-5 years. If you are 61 years of age or older, have your blood pressure checked every year. You should have your blood pressure measured twice--once when you are at a hospital or clinic, and once when you are not at a hospital or clinic. Record the average of the two measurements. To check your blood pressure when you are not at a hospital or clinic, you can use:  An automated blood pressure machine at a pharmacy.  A home blood pressure monitor.  If you are between 45 years and 39 years old, ask your health care provider if you should take aspirin to prevent strokes.  Have regular diabetes screenings. This involves taking a blood sample to check your fasting blood sugar level.  If you are at a normal weight and have a low risk for diabetes,  have this test once every three years after 51 years of age.  If you are overweight and have a high risk for diabetes, consider being tested at a younger age or more often. PREVENTING INFECTION  Hepatitis B  If you have a higher risk for hepatitis B, you should be screened for this virus. You are considered at high risk for hepatitis B if:  You were born in a country where hepatitis B is common. Ask your health care provider which countries are considered high risk.  Your parents were born in a high-risk country, and you have not been immunized against hepatitis B (hepatitis B vaccine).  You have HIV or AIDS.  You use needles to inject street drugs.  You live with someone who has hepatitis B.  You have had sex with someone who has hepatitis B.  You get hemodialysis treatment.  You take certain medicines for conditions, including cancer, organ transplantation, and autoimmune conditions. Hepatitis C  Blood testing is recommended for:  Everyone born from [birth year] through 1966.  Anyone with known risk factors for hepatitis C. Sexually transmitted infections (STIs)  You should be screened for sexually transmitted infections (STIs) including gonorrhea and chlamydia if:  You are sexually active and are younger than 51 years of age.  You are older than 51 years of age and your health care provider tells you that you are at risk for this type of infection.  Your sexual activity has changed since you were last screened and you are at an increased risk for chlamydia or gonorrhea. Ask your health care provider if you are at risk.  If you do not have HIV, but are at risk, it may be recommended that you take a prescription medicine daily to prevent HIV infection. This is called pre-exposure prophylaxis (PrEP). You are considered at risk if:  You are sexually active and do not regularly use condoms or know the HIV status of your partner(s).  You take drugs by injection.  You are sexually  active with a partner who has HIV. Talk with your health care provider about whether you are at high risk of being infected with HIV. If you choose to begin PrEP, you should first be tested for HIV. You should then be tested every 3 months for as long as you are taking PrEP.  PREGNANCY   If you are premenopausal and you may become pregnant, ask your health care provider about preconception counseling.  If you may  become pregnant, take 400 to 800 micrograms (mcg) of folic acid every day.  If you want to prevent pregnancy, talk to your health care provider about birth control (contraception). OSTEOPOROSIS AND MENOPAUSE   Osteoporosis is a disease in which the bones lose minerals and strength with aging. This can result in serious bone fractures. Your risk for osteoporosis can be identified using a bone density scan.  If you are 61 years of age or older, or if you are at risk for osteoporosis and fractures, ask your health care provider if you should be screened.  Ask your health care provider whether you should take a calcium or vitamin D supplement to lower your risk for osteoporosis.  Menopause may have certain physical symptoms and risks.  Hormone replacement therapy may reduce some of these symptoms and risks. Talk to your health care provider about whether hormone replacement therapy is right for you.  HOME CARE INSTRUCTIONS   Schedule regular health, dental, and eye exams.  Stay current with your immunizations.   Do not use any tobacco products including cigarettes, chewing tobacco, or electronic cigarettes.  If you are pregnant, do not drink alcohol.  If you are breastfeeding, limit how much and how often you drink alcohol.  Limit alcohol intake to no more than 1 drink per day for nonpregnant women. One drink equals 12 ounces of beer, 5 ounces of wine, or 1 ounces of hard liquor.  Do not use street drugs.  Do not share needles.  Ask your health care provider for help if  you need support or information about quitting drugs.  Tell your health care provider if you often feel depressed.  Tell your health care provider if you have ever been abused or do not feel safe at home.   This information is not intended to replace advice given to you by your health care provider. Make sure you discuss any questions you have with your health care provider.   Document Released: 02/23/2011 Document Revised: 08/31/2014 Document Reviewed: 07/12/2013 Elsevier Interactive Patient Education [employer].

## 2016-06-05 NOTE — Progress Notes (Signed)
Subjective:  Patient ID: Amanda [name], female    DOB: Mar 24, 1966  Age: 50 y.o. MRN: [id number]  CC: Hypertension; Manic Behavior; and severe depression   HPI Amanda FillersDonna A Hanaway is a 50 year old female with a history of hypertension, bipolar disorder, neuropathy, chronic low back pain, PUD/GERD, carpal tunnel syndrome who comes in For a complete physical exam. [facility].   Past Medical History:  Diagnosis Date  . Anxiety   . Arthritis    lower back  . Asthma   . Bipolar disorder (HCC)   . Carpal tunnel syndrome, bilateral   . Chronic back pain   . Depression   . Fx. left wrist   . GERD (gastroesophageal reflux disease)   . Heart murmur    "born with"  . Hypertension   . IBS (irritable bowel syndrome)   . Muscle spasms of neck   . Neuropathy (HCC)   . PUD (peptic ulcer disease)     Past Surgical History:  Procedure Laterality Date  . ARTHRODESIS METATARSALPHALANGEAL JOINT (MTPJ) Right 04/29/2016   Procedure: RIGHT GREAT TOE METATARSOPHALANGEAL JOINT (MTPJ) FUSION, HARDWARE REMOVAL;  Surgeon: Tarry KosNaiping M Xu, MD;  Location: [facility];  Service: Orthopedics;  Laterality: Right;  RIGHT GREAT TOE METATARSOPHALANGEAL JOINT (MTPJ) FUSION, HARDWARE REMOVAL  . CARPAL TUNNEL RELEASE Right 2003  . CHOLECYSTECTOMY N/A 02/09/2014   Procedure: LAPAROSCOPIC CHOLECYSTECTOMY WITH INTRAOPERATIVE CHOLANGIOGRAM;  Surgeon: Valarie MerinoMatthew B Martin, MD;  Location: [facility];  Service: General;  Laterality: N/A;  . HAMMER TOE FUSION Bilateral 2008  . HARDWARE REMOVAL Right 04/29/2016   Procedure: HARDWARE REMOVAL;  Surgeon: Tarry KosNaiping M Xu, MD;  Location: [facility];  Service: Orthopedics;  Laterality: Right;  HARDWARE REMOVAL  . ROTATOR CUFF REPAIR Right 2011  . WRIST FRACTURE SURGERY Left      Outpatient Medications Prior to Visit  Medication Sig Dispense Refill  . cetirizine (ZYRTEC) 10 MG tablet TAKE 1 TABLET BY MOUTH DAILY. 30 tablet 2  .  cloNIDine (CATAPRES) 0.1 MG tablet Take 1 tablet (0.1 mg total) by mouth at bedtime. 60 tablet 2  . cyclobenzaprine (FLEXERIL) 10 MG tablet TAKE 1 TABLET BY MOUTH TWICE DAILY. 60 tablet 2  . divalproex (DEPAKOTE) 500 MG DR tablet Take 500 mg by mouth 3 (three) times daily.    Marland Kitchen. gabapentin (NEURONTIN) 800 MG tablet Take 1 tablet (800 mg total) by mouth 2 (two) times daily. 60 tablet 5  . haloperidol (HALDOL) 5 MG tablet Take 1 tablet by mouth daily.  0  . hydrochlorothiazide (MICROZIDE) 12.5 MG capsule TAKE 1 CAPSULE BY MOUTH DAILY AS NEEDED FOR PEDAL EDEMA 30 capsule 5  . hydrOXYzine (ATARAX/VISTARIL) 50 MG tablet Take 1 tablet (50 mg total) by mouth 3 (three) times daily as needed for anxiety or itching. 90 tablet 2  . metoprolol (LOPRESSOR) 50 MG tablet Take 1 tablet (50 mg total) by mouth 2 (two) times daily. 60 tablet 3  . mirtazapine (REMERON) 30 MG tablet Take 45 mg by mouth at bedtime.    Marland Kitchen. olopatadine (PATANOL) 0.1 % ophthalmic solution Place 1 drop into both eyes 2 (two) times daily. 5 mL 2  . omeprazole (PRILOSEC) 40 MG capsule Take 1 capsule (40 mg total) by mouth daily. 30 capsule 5  . topiramate (TOPAMAX) 50 MG tablet Take 1 tablet (50 mg total) by mouth 2 (two) times daily. Patient 60 tablet 5  . traMADol (ULTRAM) 50 MG tablet TAKE 1  TABLET BY MOUTH EVERY 12 HOURS AS NEEDED 60 tablet 1  . traZODone (DESYREL) 300 MG tablet Take 1 tablet (300 mg total) by mouth at bedtime. 30 tablet 0  . VENTOLIN HFA 108 (90 Base) MCG/ACT inhaler INHALE 1-2 PUFFS INTO THE LUNGS EVERY 6 HOURS AS NEEDED FOR WHEEZING OR SHORTNESS OF BREATH. 18 g 0  . dicyclomine (BENTYL) 10 MG capsule Bentyl 20 mg   1 three times a day. 90 capsule 2  . hydrOXYzine (VISTARIL) 50 MG capsule Take 1 capsule by mouth daily.  1  . ondansetron (ZOFRAN) 4 MG tablet Take 1-2 tablets (4-8 mg total) by mouth every 8 (eight) hours as needed for nausea or vomiting. 40 tablet 0   No facility-administered medications prior to visit.      ROS Review of Systems  Constitutional: Negative for activity change, appetite change and fatigue.  HENT: Negative for congestion, sinus pressure and sore throat.   Eyes: Negative for visual disturbance.  Respiratory: Negative for cough, chest tightness, shortness of breath and wheezing.   Cardiovascular: Negative for chest pain and palpitations.  Gastrointestinal: Negative for abdominal distention, abdominal pain and constipation.  Endocrine: Negative for polydipsia.  Genitourinary: Negative for dysuria and frequency.  Musculoskeletal: Negative for arthralgias and back pain.  Skin: Negative for rash.  Neurological: Negative for tremors, light-headedness and numbness.  Hematological: Does not bruise/bleed easily.  Psychiatric/Behavioral: Negative for agitation and behavioral problems.    Objective:  BP 122/79 (BP Location: Right Arm, Patient Position: Sitting, Cuff Size: Large)   Pulse 77   Temp 97.5 F (36.4 C) (Oral)   Ht 5\' 8"  (1.727 m)   Wt 215 lb 9.6 oz (97.8 kg)   LMP 05/14/2016 (Exact Date)   SpO2 99%   BMI 32.78 kg/m   BP/Weight 06/05/2016 05/15/2016 04/29/2016  Systolic BP 122 112 140  Diastolic BP 79 75 88  Wt. (Lbs) 215.6 211.6 211.6  BMI 32.78 34.15 34.15  Some encounter information is confidential and restricted. Go to Review Flowsheets activity to see all data.      Physical Exam  Constitutional: She is oriented to person, place, and time. She appears well-developed and well-nourished. No distress.  HENT:  Head: Normocephalic.  Right Ear: External ear normal.  Left Ear: External ear normal.  Nose: Nose normal.  Mouth/Throat: Oropharynx is clear and moist.  Eyes: Conjunctivae and EOM are normal. Pupils are equal, round, and reactive to light.  Neck: Normal range of motion. No JVD present.  Cardiovascular: Normal rate, regular rhythm, normal heart sounds and intact distal pulses.  Exam reveals no gallop.   No murmur heard. Pulmonary/Chest: Effort  normal and breath sounds normal. No respiratory distress. She has no wheezes. She has no rales. She exhibits no tenderness. Right breast exhibits no mass, no nipple discharge and no tenderness. Left breast exhibits no mass, no nipple discharge and no tenderness.  Abdominal: Soft. Bowel sounds are normal. She exhibits no distension and no mass. There is no tenderness.  Genitourinary:  Genitourinary Comments: Normal external genitalia, normal cervix, no cervical motion tenderness  Musculoskeletal: Normal range of motion. She exhibits no edema or tenderness.  Neurological: She is alert and oriented to person, place, and time. She has normal reflexes.  Skin: Skin is warm and dry. She is not diaphoretic.  Psychiatric: She has a normal mood and affect.     Assessment & Plan:   1. Physical exam Up-to-date on colonoscopy.  2. Screening for breast cancer - MM Digital Diagnostic  [place]; Future  3. Screening for cervical cancer - Cytology - PAP [place]  4. Screening for viral disease - HIV antibody (with reflex)   No orders of the defined types were placed in this encounter.   Follow-up: Return in about 3 months (around 09/05/2016) for Follow-up of medical conditions.   Jaclyn Shaggy MD

## 2016-06-08 ENCOUNTER — Encounter: Payer: Self-pay | Admitting: *Deleted

## 2016-06-08 LAB — CYTOLOGY - PAP
Chlamydia: NEGATIVE
Diagnosis: NEGATIVE
HPV: NOT DETECTED
Neisseria Gonorrhea: NEGATIVE
Trichomonas: NEGATIVE

## 2016-06-09 ENCOUNTER — Other Ambulatory Visit: Payer: Self-pay | Admitting: Family Medicine

## 2016-06-09 ENCOUNTER — Ambulatory Visit (INDEPENDENT_AMBULATORY_CARE_PROVIDER_SITE_OTHER): Payer: Self-pay | Admitting: Orthopaedic Surgery

## 2016-06-09 DIAGNOSIS — Z1239 Encounter for other screening for malignant neoplasm of breast: Secondary | ICD-10-CM

## 2016-06-09 DIAGNOSIS — M2021 Hallux rigidus, right foot: Secondary | ICD-10-CM

## 2016-06-10 LAB — CERVICOVAGINAL ANCILLARY ONLY: Candida vaginitis: NEGATIVE

## 2016-06-15 ENCOUNTER — Other Ambulatory Visit: Payer: Self-pay | Admitting: Family Medicine

## 2016-06-15 ENCOUNTER — Telehealth: Payer: Self-pay | Admitting: Family Medicine

## 2016-06-15 MED FILL — ?METOPROLOL 50 MG TABLET: 50 | 30 days supply | Qty: 60 | Fill #1

## 2016-06-15 MED FILL — HALOPERIDOL 5 MG TABLET: 5 | 30 days supply | Qty: 30 | Fill #0

## 2016-06-15 MED FILL — OMEPRAZOLE DR 40 MG CAPSULE: 40 | 30 days supply | Qty: 30 | Fill #1

## 2016-06-15 MED FILL — GABAPENTIN 800 MG TABLET: 800 | 30 days supply | Qty: 90 | Fill #0

## 2016-06-15 MED FILL — DIVALPROEX SOD 500 MG TAB D: 500 | 30 days supply | Qty: 120 | Fill #0

## 2016-06-15 MED FILL — VENTOLIN HFA 90 MCG INHALER: 108 (90 BAS | 20 days supply | Qty: 18 | Fill #0

## 2016-06-15 MED FILL — HYDROXYZINE PAM 50 MG CAP: 50 | 30 days supply | Qty: 120 | Fill #0

## 2016-06-15 MED FILL — traZODone HCL 150 MG TABS: 150 | 30 days supply | Qty: 60 | Fill #0

## 2016-06-15 MED FILL — ?MIRTAZAPINE 30 MG TABLET: 30 | 30 days supply | Qty: 30 | Fill #0

## 2016-06-15 NOTE — Telephone Encounter (Signed)
Patient need labs results. Please call patient on new cell number [phone]

## 2016-06-15 NOTE — Telephone Encounter (Signed)
Writer called patient and discussed lab results.  Patient stated understanding. 

## 2016-06-22 ENCOUNTER — Encounter (INDEPENDENT_AMBULATORY_CARE_PROVIDER_SITE_OTHER): Payer: Self-pay | Admitting: Sports Medicine

## 2016-06-22 ENCOUNTER — Inpatient Hospital Stay (INDEPENDENT_AMBULATORY_CARE_PROVIDER_SITE_OTHER): Payer: Self-pay

## 2016-06-22 ENCOUNTER — Ambulatory Visit (INDEPENDENT_AMBULATORY_CARE_PROVIDER_SITE_OTHER): Payer: Self-pay | Admitting: Sports Medicine

## 2016-06-22 VITALS — BP 164/88 | HR 73 | Ht 68.0 in | Wt 215.0 lb

## 2016-06-22 DIAGNOSIS — M654 Radial styloid tenosynovitis [de Quervain]: Secondary | ICD-10-CM

## 2016-06-22 MED ORDER — LIDOCAINE HCL 1 % IJ SOLN
1.0000 mL | INTRAMUSCULAR | Status: AC | PRN
  Administered 2016-06-22: 1 mL

## 2016-06-22 MED ORDER — METHYLPREDNISOLONE ACETATE 40 MG/ML IJ SUSP
20.0000 mg | INTRAMUSCULAR | Status: AC | PRN
  Administered 2016-06-22: 20 mg

## 2016-06-22 MED ORDER — BUPIVACAINE HCL 0.5 % IJ SOLN
1.0000 mL | INTRAMUSCULAR | Status: AC | PRN
  Administered 2016-06-22: 1 mL

## 2016-06-22 NOTE — Progress Notes (Signed)
Amanda [name] - 50 y.o. female MRN [id number]  Date of birth: 1966-03-06  Office Visit Note: Visit Date: 06/22/2016 PCP: Jaclyn ShaggyEnobong, Amao, MD Referred by: Jaclyn ShaggyAmao, Enobong, MD  Subjective: Chief Complaint  Patient presents with  . Left Wrist - Pain  . Follow-up   HPI: Patient reports symptoms began several weeks ago & continued to worsen. Currently rates her pain at 7/10.  She had complete resolution of her symptoms following last injection back in May. Pain is worse with ulnar deviation. Occasionally radiates up towards the mid aspect of her forearm. Does not awaken her at night. She is status post recent right great toe fusion.   Hurts to do any type of movement.  Swells in the groove patient states.  Chronic medications have not been helpful.  She currently denies any numbness tingling or significant pain out of proportion but does have underlying chronic pain syndrome secondary to her underlying polyneuropathy.    ROS Otherwise per HPI.  Assessment & Plan: Visit Diagnoses:  1. De Quervain's tenosynovitis, left     Plan: No additional findings.  Patient Instructions:                  Meds & Orders: No orders of the defined types were placed in this encounter.   Orders Placed This Encounter  Procedures  . Hand/Upper Extremity Injection/Arthrocentesis  . US Guided Needle Placement    Follow-up: Return if symptoms worsen or fail to improve.   Procedures: Left first dorsal compartment ultrasound-guided injection Date/Time: 06/22/2016 10:56 AM Performed by: Gaspar BiddingIGBY, Aroura Vasudevan D Authorized by: Gaspar BiddingIGBY, Siyah Mault D   Condition: de Quervain's   Needle Size:  25 G Ultrasound Guidance: Yes   Medications:  1 mL bupivacaine 0.5 %; 1 mL lidocaine 1 %; 20 mg methylPREDNISolone acetate 40 MG/ML Comments: The patient's clinical condition is marked by substantial pain and/or significant functional disability. Other conservative therapy has not provided relief, is contraindicated, or not  appropriate. There is a reasonable likelihood that injection will significantly improve the patient's pain and/or functional impairment.  After discussing the risks, benefits and expected outcomes of the injection and all questions were reviewed and answered, the patient wished to undergo the above named procedure.  Verbal consent was obtained. The target sight was prepped with alcohol scrub. Local anesthesia was obtained with ethyl chloride.  Under real-time ultrasound guidance the target structure was aspirated and injected using the above needle and medications under sterile technique. The patient tolerated this procedure well with no immediate complications. Post injection instructions were provided.       No notes on file   Clinical History: MRI on 11/27/2015 left wrist: Severe de Quervain's tenosynovitis. Ulnar minus .Marland Kitchen. 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   S/p US guided injection of Wrist on: 12/16/15   Neuro Consult: Small fiber polyneuropathy.  She reports that she has never smoked. She has never used smokeless tobacco.   Recent Labs  07/24/15 1640  HGBA1C 4.90    Objective:  VS:  HT:5\' 8"  (172.7 cm)   WT:215 lb (97.5 kg)  BMI:32.8    BP:(!) 164/88  HR:73bpm  TEMP: ( )  RESP:  Physical Exam  Constitutional: No distress.  HENT:  Head: Normocephalic and atraumatic.  Eyes: Right eye exhibits no discharge. Left eye exhibits no discharge. No scleral icterus.  Pulmonary/Chest: Effort normal. No respiratory distress.  Musculoskeletal:  Left wrist is overall normal appearing. She has a positive Finkelstein test. Pain over the  first dorsal compartment. Improved range of motion compared in the past. Radial pulses 2+/4. Grip strength is symmetric.  Neurological: She is alert.  Appropriately interactive.  Skin: Skin is warm and dry. No rash noted. She is not diaphoretic. No erythema. No pallor.  Psychiatric: Judgment normal.    Ortho Exam Imaging: [place] Guided  Needle Placement  Result Date: 06/22/2016 CLINICAL DATA: US Guided 1st Dorsal Compartment Leftr Ultrasound was provided for use by the ordering physician, and a technical charge was applied by the performing facility.  No radiologist interpretation/professional services rendered.    Past Medical/Family/Surgical/Social History: Medications & Allergies reviewed per EMR Patient Active Problem List   Diagnosis Date Noted  . Painful orthopaedic hardware (HCC) 03/24/2016  . Corns and callosities 03/24/2016  . PUD (peptic ulcer disease) 01/02/2016  . Onychomycosis 01/02/2016  . Seasonal allergies 11/19/2015  . Wrist pain, left 10/23/2015  . GERD (gastroesophageal reflux disease) 08/06/2015  . Asthma 08/06/2015  . Back pain 08/06/2015  . Migraine 08/06/2015  . Bipolar disorder (HCC) 07/25/2015  . Neuropathy (HCC) 07/08/2015  . Major depressive disorder, recurrent, severe without psychotic features (HCC)   . Alcohol use disorder, severe, dependence (HCC) 11/16/2014  . Cocaine use disorder, severe, dependence (HCC) 11/16/2014  . Acute calculous cholecystitis s/p lap chole 02/09/2014 02/09/2014  . HTN (hypertension) 07/25/2013  . Pain in vertebral column 05/05/2013  . Carpal tunnel syndrome 11/18/2012  . Numbness and tingling of both legs below knees 11/18/2012  . Lumbago 11/18/2012  . Anxiety state, unspecified 11/18/2012   Past Medical History:  Diagnosis Date  . Anxiety   . Arthritis    lower back  . Asthma   . Bipolar disorder (HCC)   . Carpal tunnel syndrome, bilateral   . Chronic back pain   . Depression   . Diverticulosis   . Fx. left wrist   . GERD (gastroesophageal reflux disease)   . Heart murmur    "born with"  . Hypertension   . IBS (irritable bowel syndrome)   . Internal hemorrhoids   . Muscle spasms of neck   . Neuropathy (HCC)   . PUD (peptic ulcer disease)    Family History  Problem Relation Age of Onset  . Pancreatic cancer Mother   . Heart failure  Father   . Diabetes Father   . Breast cancer Paternal Grandmother   . HIV/AIDS Brother   . Other Sister     Brain tumor  . Colon cancer Neg Hx    Past Surgical History:  Procedure Laterality Date  . ARTHRODESIS METATARSALPHALANGEAL JOINT (MTPJ) Right 04/29/2016   Procedure: RIGHT GREAT TOE METATARSOPHALANGEAL JOINT (MTPJ) FUSION, HARDWARE REMOVAL;  Surgeon: Tarry KosNaiping M Xu, MD;  Location: [facility];  Service: Orthopedics;  Laterality: Right;  RIGHT GREAT TOE METATARSOPHALANGEAL JOINT (MTPJ) FUSION, HARDWARE REMOVAL  . CARPAL TUNNEL RELEASE Right 2003  . CHOLECYSTECTOMY N/A 02/09/2014   Procedure: LAPAROSCOPIC CHOLECYSTECTOMY WITH INTRAOPERATIVE CHOLANGIOGRAM;  Surgeon: Valarie MerinoMatthew B Martin, MD;  Location: [facility];  Service: General;  Laterality: N/A;  . HAMMER TOE FUSION Bilateral 2008  . HARDWARE REMOVAL Right 04/29/2016   Procedure: HARDWARE REMOVAL;  Surgeon: Tarry KosNaiping M Xu, MD;  Location: [facility];  Service: Orthopedics;  Laterality: Right;  HARDWARE REMOVAL  . ROTATOR CUFF REPAIR Right 2011  . WRIST FRACTURE SURGERY Left    Social History   Occupational History  . [employer]     [employer]   Social History Main Topics  . Smoking status: Never  Smoker  . Smokeless tobacco: Never Used  . Alcohol use No  . Drug use: No     Comment: last used two years ago  . Sexual activity: No

## 2016-06-25 ENCOUNTER — Telehealth (INDEPENDENT_AMBULATORY_CARE_PROVIDER_SITE_OTHER): Payer: Self-pay | Admitting: Sports Medicine

## 2016-06-26 NOTE — Telephone Encounter (Signed)
Im not seeing anything concerning a referral or an order for patient to schedule an appointment with Dr. Alvester MorinNewton.  You can call and advise her of this. Thank You

## 2016-06-26 NOTE — Telephone Encounter (Signed)
[facility]. Order made 06/09/16 and [facility]. Per messages Amy was trying to get in contact with her. Amy called several times and no answer.

## 2016-06-29 ENCOUNTER — Ambulatory Visit (INDEPENDENT_AMBULATORY_CARE_PROVIDER_SITE_OTHER): Payer: Self-pay | Admitting: Internal Medicine

## 2016-06-29 ENCOUNTER — Encounter: Payer: Self-pay | Admitting: Internal Medicine

## 2016-06-29 VITALS — BP 100/80 | HR 76 | Ht 67.0 in | Wt 217.4 lb

## 2016-06-29 DIAGNOSIS — K580 Irritable bowel syndrome with diarrhea: Secondary | ICD-10-CM

## 2016-06-29 DIAGNOSIS — K30 Functional dyspepsia: Secondary | ICD-10-CM

## 2016-06-29 DIAGNOSIS — K219 Gastro-esophageal reflux disease without esophagitis: Secondary | ICD-10-CM

## 2016-06-29 MED ORDER — COLESTIPOL HCL 1 G PO TABS: 2.0000 g | ORAL_TABLET | ORAL | 2 refills | Status: DC

## 2016-06-29 MED ORDER — PANTOPRAZOLE SODIUM 40 MG PO TBEC: 40.0000 mg | DELAYED_RELEASE_TABLET | Freq: Every day | ORAL | 3 refills | Status: DC

## 2016-06-29 MED FILL — ALL DAY ALLERGY 10 MG TAB: 10 | 30 days supply | Qty: 30 | Fill #1

## 2016-06-29 MED FILL — COLESTIPOL HCL 1 GM TABLET: 1 | 30 days supply | Qty: 60 | Fill #0

## 2016-06-29 MED FILL — ?PANTOPRAZOLE SOD DR 40MG: 40 MG | 30 days supply | Qty: 30 | Fill #0

## 2016-06-29 NOTE — Telephone Encounter (Signed)
Pt has [facility] discount so no precert required

## 2016-06-29 NOTE — Progress Notes (Deleted)
Subjective:     Patient ID: Amanda [name], female   DOB: 20-Sep-1965, 50 y.o.   MRN: [id number]  HPI   Review of Systems     Objective:   Physical Exam     Assessment:     ***    Plan:     ***

## 2016-06-29 NOTE — Progress Notes (Signed)
Subjective:    Patient ID: Amanda [name], female    DOB: 06-18-1966, 50 y.o.   MRN: [id number]  HPI Mariel AloeDonna [name] is a 50 year old female with a past medical history of GERD with dysphagia, functional dyspepsia/epigastric pain, chronic diarrhea who is here for follow-up. She also has a history of bipolar disorder, migraines, substance abuse in remission, hypertension and chronic low back pain. She was initially seen by Mike GipAmy Esterwood, PA-C on 01/29/2016. This was followed by upper endoscopy and colonoscopy performed on 02/18/2016 and 04/15/2016. EGD was unremarkable from a dysphagia respective though dilation was performed with a 51 Fr Savary dilator. Biopsies of the stomach were performed and duodenum was also biopsied but normal appearing. Biopsies from the esophagus, stomach and small bowel were normal. Colonoscopy revealed a few sigmoid diverticuli but was otherwise normal. Internal hemorrhoids are found on retroflexion. Biopsies were negative for microscopic colitis and IBD.  She had been treated with omeprazole 40 mg daily for her GERD and dyspeptic symptoms and also given a prescription for Bentyl 20 mg 3 times a day.  She reports that omeprazole does help with her reflux symptoms as well as throat clearing but has not helped her epigastric abdominal discomfort. She is also continued to have loose stools 3-4 times per day. This is been worse since having her gallbladder out 2 years ago. Food such as broccoli and heavy foods high in fat also make her loose stools worse. Her stools are rarely formed. She denies blood in her stool or melena. Her dysphagia improved tremendously after dilation.  Review of Systems As per history of present illness, otherwise negative  Current Medications, Allergies, Past Medical History, Past Surgical History, Family History and Social History were reviewed in [facility] electronic [facility] record.     Objective:   Physical Exam BP 100/80   Pulse 76   Ht  5\' 7"  (1.702 m) Comment: measured without shoes  Wt 217 lb 6 oz (98.6 kg)   LMP 06/15/2016 (Within Weeks)   BMI 34.05 kg/m  Constitutional: Well-developed and well-nourished. No distress. HEENT: Normocephalic and atraumatic.  Conjunctivae are normal.  No scleral icterus. Neck: Neck supple. Trachea midline. Cardiovascular: Normal rate, regular rhythm and intact distal pulses. No M/R/G Pulmonary/chest: Effort normal and breath sounds normal. No wheezing, rales or rhonchi. Abdominal: Soft,Epigastric tenderness without rebound or guarding, nondistended. Bowel sounds active throughout. There are no masses palpable. No hepatosplenomegaly. Extremities: no clubbing, cyanosis, or edema Neurological: Alert and oriented to person place and time. Skin: Skin is warm and dry. No rashes noted. Psychiatric: Normal mood and flat affect. Behavior is normal.  CBC    Component Value Date/Time   WBC 10.0 01/29/2016 1154   RBC 3.98 01/29/2016 1154   HGB 12.7 01/29/2016 1154   HCT 38.6 01/29/2016 1154   PLT 231.0 01/29/2016 1154   MCV 97.0 01/29/2016 1154   MCH 31.0 12/05/2015 2312   MCHC 32.8 01/29/2016 1154   RDW 14.5 01/29/2016 1154   LYMPHSABS 3.4 01/29/2016 1154   MONOABS 0.6 01/29/2016 1154   EOSABS 0.3 01/29/2016 1154   BASOSABS 0.0 01/29/2016 1154   CMP     Component Value Date/Time   NA 139 04/28/2016 1200   K 3.9 04/28/2016 1200   CL 112 (H) 04/28/2016 1200   CO2 20 (L) 04/28/2016 1200   GLUCOSE 87 04/28/2016 1200   BUN 8 04/28/2016 1200   CREATININE 0.86 04/28/2016 1200   CREATININE 0.73 11/27/2015 1038   CALCIUM 8.8 (L) 04/28/2016  1200   PROT 6.0 01/29/2016 1154   ALBUMIN 3.5 01/29/2016 1154   AST 16 01/29/2016 1154   ALT 16 01/29/2016 1154   ALKPHOS 58 01/29/2016 1154   BILITOT 0.3 01/29/2016 1154   GFRNONAA >60 04/28/2016 1200   GFRNONAA >89 11/27/2015 1038   GFRAA >60 04/28/2016 1200   GFRAA >89 11/27/2015 1038   Lipase     Component Value Date/Time   LIPASE 12.0  01/29/2016 1154       Assessment & Plan:  50 year old female with a past medical history of GERD with dysphagia, functional dyspepsia/epigastric pain, chronic diarrhea who is here for follow-up.  1. GERD/functional dyspepsia/dysphagia -- she has used pantoprazole in the past and feels that it helps better for her epigastric discomfort than omeprazole. Discontinue omeprazole, changed to pantoprazole 40 mg 30 minutes before breakfast daily. GERD diet recommended. Given improvement in dysphagia after dilation, repeat dilation can be performed as needed  2. Chronic diarrhea -- negative biopsies for microscopic colitis. Bentyl hasn't really helped. Discontinue Bentyl. Symptoms seem to worsen after cholecystectomy. Trial of colestipol 2 g daily. She is asked to call me 2-3 weeks after beginning colestipol to assess response. Dose may need to be titrated. She was instructed to separate colestipol on 2 hours from either side of other medicines due to the potential of impaired absorption of other meds.  25 minutes spent with the patient today. Greater than 50% was spent in counseling and coordination of care with the patient

## 2016-06-29 NOTE — Patient Instructions (Addendum)
Discontinue omeprazole. Instead, you will take pantoprazole.  We have sent the following medications to your pharmacy for you to pick up at your convenience: Pantoprazole 40 mg every morning, 30 minutes before breakfast Colestipol 2 grams every morning (seperate by 2 hours from any other medication)  Discontinue bentyl  Follow up with Dr Rhea BeltonPyrtle on 09/24/16,Thursday @ 2 pm.  Call our office in 2-3 weeks to let us know how colestipol is working.  If you are age 765 or older, your body mass index should be between 23-30. Your Body mass index is 34.05 kg/m. If this is out of the aforementioned range listed, please consider follow up with your Primary Care Provider.  If you are age 51 or younger, your body mass index should be between 19-25. Your Body mass index is 34.05 kg/m. If this is out of the aformentioned range listed, please consider follow up with your Primary Care Provider.

## 2016-06-30 ENCOUNTER — Other Ambulatory Visit: Payer: Self-pay | Admitting: Family Medicine

## 2016-06-30 DIAGNOSIS — J302 Other seasonal allergic rhinitis: Secondary | ICD-10-CM

## 2016-06-30 MED FILL — ?CYCLOBENZAPRINE 10 MG TABL: 10 | 30 days supply | Qty: 60 | Fill #2

## 2016-06-30 MED FILL — OLOPATADINE HCL 0.1% EYE DR: 0.1 | 10 days supply | Qty: 5 | Fill #0

## 2016-06-30 MED FILL — ?HYDROCHLOROTHIAZIDE 12.5 M: 12.5 | 30 days supply | Qty: 30 | Fill #0

## 2016-06-30 MED FILL — traMADol HCL 50 MG TABS: 50 | 30 days supply | Qty: 60 | Fill #1

## 2016-07-06 ENCOUNTER — Encounter (INDEPENDENT_AMBULATORY_CARE_PROVIDER_SITE_OTHER): Payer: Self-pay | Admitting: Physical Medicine and Rehabilitation

## 2016-07-06 ENCOUNTER — Ambulatory Visit (INDEPENDENT_AMBULATORY_CARE_PROVIDER_SITE_OTHER): Payer: Self-pay | Admitting: Physical Medicine and Rehabilitation

## 2016-07-06 VITALS — BP 123/77 | HR 78 | Temp 97.7°F

## 2016-07-06 DIAGNOSIS — M47816 Spondylosis without myelopathy or radiculopathy, lumbar region: Secondary | ICD-10-CM

## 2016-07-06 MED ORDER — LIDOCAINE HCL (PF) 1 % IJ SOLN: 0.3300 mL | Freq: Once | INTRAMUSCULAR | Status: DC

## 2016-07-06 MED ORDER — METHYLPREDNISOLONE ACETATE 80 MG/ML IJ SUSP
80.0000 mg | Freq: Once | INTRAMUSCULAR | Status: AC
  Administered 2016-07-06: 80 mg

## 2016-07-06 NOTE — Patient Instructions (Signed)

## 2016-07-06 NOTE — Progress Notes (Signed)
Amanda [name] - 50 y.o. female MRN [id number]  Date of birth: 10-25-1965  Office Visit Note: Visit Date: 07/06/2016 PCP: Jaclyn ShaggyEnobong, Amao, MD Referred by: Jaclyn ShaggyAmao, Enobong, MD  Subjective: Chief Complaint  Patient presents with  . Lower Back - Pain   HPI: Amanda [name] is a 50 year old female who was kindly sent to us by Dr. Roda ShuttersXu for facet joint injections. She's been having pain center of lower back times several months. Constant pain at times and then other times it will come and go. Increased pain with walking. Numbness and tingling right leg. "pins and needles" both feet. She does report a history of neuropathy. She also has a history of seeing Dr. Ethelene Halamos for pain management. She did not have any injections by him because she was scared to have them done. We spent probably 15 minutes talking with her and her friend about injections in the safety of how is done. She does want to proceed. Her MRI findings show very mild facet joint arthropathy at L4-5 and otherwise a fairly normal spine. There is nothing in the spine that would cause tingling numbness down to the feet. We explained this to her. She is very pleasant lady and is been through a lot. She has bipolar disorder and major depressive disorder and a history of substance abuse disorder.   She has had back pain for many years. She also has other areas of pain that are been addressed. Dr. Roda ShuttersXu has been following her for her foot. She did ask about alternatives to injections. We discussed the use of various medications as well as physical therapy and chiropractic care.  Takes no blood thinners and no dye allergy    Review of Systems  Constitutional: Negative for chills, fever, malaise/fatigue and weight loss.  HENT: Negative for hearing loss and sinus pain.   Eyes: Negative for blurred vision, double vision and photophobia.  Respiratory: Negative for cough and shortness of breath.   Cardiovascular: Negative for chest pain, palpitations and leg  swelling.  Gastrointestinal: Negative for abdominal pain, nausea and vomiting.  Genitourinary: Negative for flank pain.  Musculoskeletal: Positive for back pain and joint pain. Negative for myalgias.  Skin: Negative for itching and rash.  Neurological: Negative for tremors, focal weakness and weakness.  Endo/Heme/Allergies: Negative.   Psychiatric/Behavioral: Negative for depression.  All other systems reviewed and are negative.  Otherwise per HPI.  Assessment & Plan: Visit Diagnoses:  1. Spondylosis without myelopathy or radiculopathy, lumbar region     Plan: Findings:  Complicated chronic pain patient with history of major depressive disorder bipolar disorder as well as substance abuse disorder. I did review her lumbar spine MRI which shows mild facet joint arthritis but otherwise fairly normal spine without compression or stenosis. The numbness and tingling in her feet is not coming from her spine. We going to complete diagnostic facet joint blocks after talking with the patient. Depending on that relief however she would probably do better in a comprehensive pain management type program that could provide the same injections along with possibly pain psychologist etc.    Meds & Orders:  Meds ordered this encounter  Medications  . lidocaine (PF) (XYLOCAINE) 1 % injection 0.3 mL  . methylPREDNISolone acetate (DEPO-MEDROL) injection 80 mg    Orders Placed This Encounter  Procedures  . Nerve Block    Follow-up: Return for Follow up with Dr. Roda ShuttersXu as scheduled.   Procedures: No procedures performed  No notes on file   Clinical History: No  specialty comments available.  She reports that she has never smoked. She has never used smokeless tobacco.   Recent Labs  07/24/15 1640  HGBA1C 4.90    Objective:  VS:  HT:    WT:   BMI:     BP:123/77  HR:78bpm  TEMP:97.7 F (36.5 C)( )  RESP:95 % Physical Exam  Constitutional: She is oriented to person, place, and time. She appears  well-developed and well-nourished.  Eyes: Conjunctivae and EOM are normal. Pupils are equal, round, and reactive to light.  Cardiovascular: Normal rate and intact distal pulses.   Pulmonary/Chest: Effort normal.  Musculoskeletal:  Patient is slow to rise from a seated position and ambulates without aid with normal distal strength.  Neurological: She is alert and oriented to person, place, and time.  Skin: Skin is warm and dry. No erythema.  Psychiatric: She has a normal mood and affect. Her behavior is normal.  Nursing note and vitals reviewed.   Ortho Exam Imaging: No results found.  Past Medical/Family/Surgical/Social History: Medications & Allergies reviewed per EMR Patient Active Problem List   Diagnosis Date Noted  . Painful orthopaedic hardware (HCC) 03/24/2016  . Corns and callosities 03/24/2016  . PUD (peptic ulcer disease) 01/02/2016  . Onychomycosis 01/02/2016  . Seasonal allergies 11/19/2015  . Wrist pain, left 10/23/2015  . GERD (gastroesophageal reflux disease) 08/06/2015  . Asthma 08/06/2015  . Back pain 08/06/2015  . Migraine 08/06/2015  . Bipolar disorder (HCC) 07/25/2015  . Neuropathy (HCC) 07/08/2015  . Major depressive disorder, recurrent, severe without psychotic features (HCC)   . Alcohol use disorder, severe, dependence (HCC) 11/16/2014  . Cocaine use disorder, severe, dependence (HCC) 11/16/2014  . Acute calculous cholecystitis s/p lap chole 02/09/2014 02/09/2014  . HTN (hypertension) 07/25/2013  . Pain in vertebral column 05/05/2013  . Carpal tunnel syndrome 11/18/2012  . Numbness and tingling of both legs below knees 11/18/2012  . Lumbago 11/18/2012  . Anxiety state, unspecified 11/18/2012   Past Medical History:  Diagnosis Date  . Anxiety   . Arthritis    lower back  . Asthma   . Bipolar disorder (HCC)   . Carpal tunnel syndrome, bilateral   . Chronic back pain   . Depression   . Diverticulosis   . Fx. left wrist   . GERD  (gastroesophageal reflux disease)   . Heart murmur    "born with"  . Hypertension   . IBS (irritable bowel syndrome)   . Internal hemorrhoids   . Muscle spasms of neck   . Neuropathy (HCC)   . PUD (peptic ulcer disease)    Family History  Problem Relation Age of Onset  . Pancreatic cancer Mother   . Heart failure Father   . Diabetes Father   . Breast cancer Paternal Grandmother   . HIV/AIDS Brother   . Other Sister     Brain tumor  . Colon cancer Neg Hx    Past Surgical History:  Procedure Laterality Date  . ARTHRODESIS METATARSALPHALANGEAL JOINT (MTPJ) Right 04/29/2016   Procedure: RIGHT GREAT TOE METATARSOPHALANGEAL JOINT (MTPJ) FUSION, HARDWARE REMOVAL;  Surgeon: Tarry KosNaiping M Xu, MD;  Location: [facility];  Service: Orthopedics;  Laterality: Right;  RIGHT GREAT TOE METATARSOPHALANGEAL JOINT (MTPJ) FUSION, HARDWARE REMOVAL  . CARPAL TUNNEL RELEASE Right 2003  . CHOLECYSTECTOMY N/A 02/09/2014   Procedure: LAPAROSCOPIC CHOLECYSTECTOMY WITH INTRAOPERATIVE CHOLANGIOGRAM;  Surgeon: Valarie MerinoMatthew B Martin, MD;  Location: [facility];  Service: General;  Laterality: N/A;  . HAMMER TOE FUSION Bilateral  2008  . HARDWARE REMOVAL Right 04/29/2016   Procedure: HARDWARE REMOVAL;  Surgeon: Tarry Kos, MD;  Location: [facility];  Service: Orthopedics;  Laterality: Right;  HARDWARE REMOVAL  . ROTATOR CUFF REPAIR Right 2011  . WRIST FRACTURE SURGERY Left    Social History   Occupational History  . [name]   Social History Main Topics  . Smoking status: Never Smoker  . Smokeless tobacco: Never Used  . Alcohol use No  . Drug use: No     Comment: last used two years ago  . Sexual activity: No

## 2016-07-07 NOTE — Procedures (Signed)
Lumbar Facet Joint Intra-Articular Injection(s) with Fluoroscopic Guidance  Patient: Amanda FillersDonna A Harvell      Date of Birth: Nov 30, 1965 MRN: [id number] PCP: Jaclyn ShaggyEnobong, Amao, MD      Visit Date: 07/06/2016   Universal Protocol:    Date/Time: 11/14/175:43 AM  Consent Given By: the patient  Position: PRONE   Additional Comments: Vital signs were monitored before and after the procedure. Patient was prepped and draped in the usual sterile fashion. The correct patient, procedure, and site was verified.   Injection Procedure Details:  Procedure Site One Meds Administered:  Meds ordered this encounter  Medications  . lidocaine (PF) (XYLOCAINE) 1 % injection 0.3 mL  . methylPREDNISolone acetate (DEPO-MEDROL) injection 80 mg     Laterality: Bilateral  Location/Site:  L4-L5  Needle size: 22 guage  Needle type: Spinal  Needle Placement: Articular  Findings:  -Contrast Used: 1 mL iohexol 180 mg iodine/mL   -Comments: Excellent flow of contrast producing a partial arthrogram.  Procedure Details: The fluoroscope beam is vertically oriented in AP, and the inferior recess is visualized beneath the lower pole of the inferior apophyseal process, which represents the target point for needle insertion. When direct visualization is difficult the target point is located at the medial projection of the vertebral pedicle. The region overlying each aforementioned target is locally anesthetized with a 1 to 2 ml. volume of 1% Lidocaine without Epinephrine.   The spinal needle was inserted into each of the above mentioned facet joints using biplanar fluoroscopic guidance. A 0.25 to 0.5 ml. volume of Isovue-250 was injected and a partial facet joint arthrogram was obtained. A single spot film was obtained of the resulting arthrogram.    One to 1.25 ml of the steroid/anesthetic solution was then injected into each of the facet joints noted above.   Additional Comments:  The patient tolerated the  procedure well Dressing: Band-Aid    Post-procedure details: Patient was observed during the procedure. Post-procedure instructions were reviewed.  Patient left the clinic in stable condition.

## 2016-07-13 MED FILL — GABAPENTIN 800 MG TABLET: 800 | 30 days supply | Qty: 90 | Fill #1

## 2016-07-13 MED FILL — HALOPERIDOL 5 MG TABLET: 5 | 30 days supply | Qty: 30 | Fill #1

## 2016-07-13 MED FILL — HYDROXYZINE PAM 50 MG CAP: 50 | 30 days supply | Qty: 120 | Fill #1

## 2016-07-13 MED FILL — ?MIRTAZAPINE 30 MG TABLET: 30 | 30 days supply | Qty: 30 | Fill #1

## 2016-07-21 ENCOUNTER — Ambulatory Visit: Payer: Self-pay | Attending: Family Medicine

## 2016-07-21 ENCOUNTER — Ambulatory Visit (INDEPENDENT_AMBULATORY_CARE_PROVIDER_SITE_OTHER): Payer: No Typology Code available for payment source | Admitting: Orthopaedic Surgery

## 2016-07-23 ENCOUNTER — Encounter (INDEPENDENT_AMBULATORY_CARE_PROVIDER_SITE_OTHER): Payer: Self-pay | Admitting: Orthopaedic Surgery

## 2016-07-23 ENCOUNTER — Ambulatory Visit (INDEPENDENT_AMBULATORY_CARE_PROVIDER_SITE_OTHER): Payer: Self-pay | Admitting: Orthopaedic Surgery

## 2016-07-23 ENCOUNTER — Ambulatory Visit (INDEPENDENT_AMBULATORY_CARE_PROVIDER_SITE_OTHER): Payer: Self-pay

## 2016-07-23 DIAGNOSIS — M2021 Hallux rigidus, right foot: Secondary | ICD-10-CM

## 2016-07-23 NOTE — Progress Notes (Signed)
Office Visit Note   Patient: Amanda [name]           Date of Birth: Aug 26, 1965           MRN: [id number] Visit Date: 07/23/2016              Requested by: Jaclyn ShaggyEnobong Amao, MD [address] [place], [place] [id number] PCP: Jaclyn ShaggyEnobong, Amao, MD   Assessment & Plan: Visit Diagnoses:  1. Hallux rigidus of right foot     Plan:  - patient is doing well from surgery - may resume normal activities - f/u prn  Follow-Up Instructions: Return if symptoms worsen or fail to improve.   Orders:  Orders Placed This Encounter  Procedures  . XR Foot Complete Right   No orders of the defined types were placed in this encounter.     Procedures: No procedures performed   Clinical Data: No additional findings.   Subjective: Chief Complaint  Patient presents with  . Right Great Toe - Pain  . Right Foot - Pain    HPI 3 month postop visit for hallux rigidus fusion.  She's doing well. Happy with her toe function.  Complaining of some lateral foot pain from altered gait.   Review of Systems   Objective: Vital Signs: LMP 06/15/2016 (Within Weeks)   Physical Exam  Ortho Exam 1st ray and GT are not tender.  No signs of infection.  Benign exam Specialty Comments:  MRI on 11/27/2015 left wrist: Severe de Quervain's tenosynovitis. Ulnar minus .Marland Kitchen. 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   S/p US guided injection of Wrist on: 12/16/15   Neuro Consult: Small fiber polyneuropathy.  Imaging: Xr Foot Complete Right  Result Date: 07/23/2016 Stable fusion of great toe with intact hardware    PMFS History: Patient Active Problem List   Diagnosis Date Noted  . Painful orthopaedic hardware (HCC) 03/24/2016  . Corns and callosities 03/24/2016  . PUD (peptic ulcer disease) 01/02/2016  . Onychomycosis 01/02/2016  . Seasonal allergies 11/19/2015  . Wrist pain, left 10/23/2015  . GERD (gastroesophageal reflux disease) 08/06/2015  . Asthma 08/06/2015  . Back pain  08/06/2015  . Migraine 08/06/2015  . Bipolar disorder (HCC) 07/25/2015  . Neuropathy (HCC) 07/08/2015  . Major depressive disorder, recurrent, severe without psychotic features (HCC)   . Alcohol use disorder, severe, dependence (HCC) 11/16/2014  . Cocaine use disorder, severe, dependence (HCC) 11/16/2014  . Acute calculous cholecystitis s/p lap chole 02/09/2014 02/09/2014  . HTN (hypertension) 07/25/2013  . Pain in vertebral column 05/05/2013  . Carpal tunnel syndrome 11/18/2012  . Numbness and tingling of both legs below knees 11/18/2012  . Lumbago 11/18/2012  . Anxiety state, unspecified 11/18/2012   Past Medical History:  Diagnosis Date  . Anxiety   . Arthritis    lower back  . Asthma   . Bipolar disorder (HCC)   . Carpal tunnel syndrome, bilateral   . Chronic back pain   . Depression   . Diverticulosis   . Fx. left wrist   . GERD (gastroesophageal reflux disease)   . Heart murmur    "born with"  . Hypertension   . IBS (irritable bowel syndrome)   . Internal hemorrhoids   . Muscle spasms of neck   . Neuropathy (HCC)   . PUD (peptic ulcer disease)     Family History  Problem Relation Age of Onset  . Pancreatic cancer Mother   . Heart failure Father   . Diabetes  Father   . Breast cancer Paternal Grandmother   . HIV/AIDS Brother   . Other Sister     Brain tumor  . Colon cancer Neg Hx     Past Surgical History:  Procedure Laterality Date  . ARTHRODESIS METATARSALPHALANGEAL JOINT (MTPJ) Right 04/29/2016   Procedure: RIGHT GREAT TOE METATARSOPHALANGEAL JOINT (MTPJ) FUSION, HARDWARE REMOVAL;  Surgeon: Tarry KosNaiping M Xu, MD;  Location: [facility];  Service: Orthopedics;  Laterality: Right;  RIGHT GREAT TOE METATARSOPHALANGEAL JOINT (MTPJ) FUSION, HARDWARE REMOVAL  . CARPAL TUNNEL RELEASE Right 2003  . CHOLECYSTECTOMY N/A 02/09/2014   Procedure: LAPAROSCOPIC CHOLECYSTECTOMY WITH INTRAOPERATIVE CHOLANGIOGRAM;  Surgeon: Valarie MerinoMatthew B Martin, MD;  Location: [facility];   Service: General;  Laterality: N/A;  . HAMMER TOE FUSION Bilateral 2008  . HARDWARE REMOVAL Right 04/29/2016   Procedure: HARDWARE REMOVAL;  Surgeon: Tarry KosNaiping M Xu, MD;  Location: [facility];  Service: Orthopedics;  Laterality: Right;  HARDWARE REMOVAL  . ROTATOR CUFF REPAIR Right 2011  . WRIST FRACTURE SURGERY Left    Social History   Occupational History  . [employer]     [employer]   Social History Main Topics  . Smoking status: Never Smoker  . Smokeless tobacco: Never Used  . Alcohol use No  . Drug use: No     Comment: last used two years ago  . Sexual activity: No

## 2016-07-24 ENCOUNTER — Ambulatory Visit (INDEPENDENT_AMBULATORY_CARE_PROVIDER_SITE_OTHER): Payer: No Typology Code available for payment source | Admitting: Orthopaedic Surgery

## 2016-08-05 MED FILL — ?PANTOPRAZOLE SOD DR 40MG: 40 MG | 30 days supply | Qty: 30 | Fill #1

## 2016-08-05 MED FILL — METOPROLOL TARTRATE 50 MG T: 50 | 30 days supply | Qty: 60 | Fill #2

## 2016-08-12 ENCOUNTER — Other Ambulatory Visit: Payer: Self-pay

## 2016-08-12 ENCOUNTER — Encounter: Payer: Self-pay | Admitting: Family Medicine

## 2016-08-12 ENCOUNTER — Ambulatory Visit: Payer: Self-pay | Attending: Family Medicine | Admitting: Family Medicine

## 2016-08-12 VITALS — BP 121/81 | HR 130 | Temp 98.2°F | Ht 67.0 in | Wt 224.0 lb

## 2016-08-12 DIAGNOSIS — F419 Anxiety disorder, unspecified: Secondary | ICD-10-CM | POA: Insufficient documentation

## 2016-08-12 DIAGNOSIS — Z6835 Body mass index (BMI) 35.0-35.9, adult: Secondary | ICD-10-CM | POA: Insufficient documentation

## 2016-08-12 DIAGNOSIS — G43709 Chronic migraine without aura, not intractable, without status migrainosus: Secondary | ICD-10-CM | POA: Insufficient documentation

## 2016-08-12 DIAGNOSIS — I10 Essential (primary) hypertension: Secondary | ICD-10-CM | POA: Insufficient documentation

## 2016-08-12 DIAGNOSIS — E669 Obesity, unspecified: Secondary | ICD-10-CM | POA: Insufficient documentation

## 2016-08-12 DIAGNOSIS — K5790 Diverticulosis of intestine, part unspecified, without perforation or abscess without bleeding: Secondary | ICD-10-CM | POA: Insufficient documentation

## 2016-08-12 DIAGNOSIS — F308 Other manic episodes: Secondary | ICD-10-CM | POA: Insufficient documentation

## 2016-08-12 DIAGNOSIS — G5603 Carpal tunnel syndrome, bilateral upper limbs: Secondary | ICD-10-CM | POA: Insufficient documentation

## 2016-08-12 DIAGNOSIS — K219 Gastro-esophageal reflux disease without esophagitis: Secondary | ICD-10-CM | POA: Insufficient documentation

## 2016-08-12 DIAGNOSIS — K648 Other hemorrhoids: Secondary | ICD-10-CM | POA: Insufficient documentation

## 2016-08-12 DIAGNOSIS — G629 Polyneuropathy, unspecified: Secondary | ICD-10-CM | POA: Insufficient documentation

## 2016-08-12 DIAGNOSIS — E6609 Other obesity due to excess calories: Secondary | ICD-10-CM

## 2016-08-12 DIAGNOSIS — M545 Low back pain: Secondary | ICD-10-CM | POA: Insufficient documentation

## 2016-08-12 DIAGNOSIS — R000 Tachycardia, unspecified: Secondary | ICD-10-CM | POA: Insufficient documentation

## 2016-08-12 DIAGNOSIS — Z8711 Personal history of peptic ulcer disease: Secondary | ICD-10-CM | POA: Insufficient documentation

## 2016-08-12 DIAGNOSIS — Z880 Allergy status to penicillin: Secondary | ICD-10-CM | POA: Insufficient documentation

## 2016-08-12 DIAGNOSIS — F329 Major depressive disorder, single episode, unspecified: Secondary | ICD-10-CM | POA: Insufficient documentation

## 2016-08-12 DIAGNOSIS — F319 Bipolar disorder, unspecified: Secondary | ICD-10-CM | POA: Insufficient documentation

## 2016-08-12 DIAGNOSIS — J45909 Unspecified asthma, uncomplicated: Secondary | ICD-10-CM | POA: Insufficient documentation

## 2016-08-12 DIAGNOSIS — K589 Irritable bowel syndrome without diarrhea: Secondary | ICD-10-CM | POA: Insufficient documentation

## 2016-08-12 DIAGNOSIS — G8929 Other chronic pain: Secondary | ICD-10-CM | POA: Insufficient documentation

## 2016-08-12 MED ORDER — HYDROCHLOROTHIAZIDE 12.5 MG PO CAPS: ORAL_CAPSULE | ORAL | 5 refills | Status: DC

## 2016-08-12 MED ORDER — CETIRIZINE HCL 10 MG PO TABS
10.0000 mg | ORAL_TABLET | Freq: Every day | ORAL | 2 refills | Status: DC
Start: 2016-08-12 — End: 2016-10-23

## 2016-08-12 MED ORDER — TOPIRAMATE 50 MG PO TABS: 100.0000 mg | ORAL_TABLET | Freq: Two times a day (BID) | ORAL | 5 refills | Status: DC

## 2016-08-12 MED ORDER — METOPROLOL TARTRATE 50 MG PO TABS: 50.0000 mg | ORAL_TABLET | Freq: Two times a day (BID) | ORAL | 3 refills | Status: DC

## 2016-08-12 MED ORDER — CYCLOBENZAPRINE HCL 10 MG PO TABS: 10.0000 mg | ORAL_TABLET | Freq: Two times a day (BID) | ORAL | 2 refills | Status: DC

## 2016-08-12 MED FILL — ?TOPIRAMATE 50 MG TAB: 50 MG | 30 days supply | Qty: 120 | Fill #0

## 2016-08-12 MED FILL — ?MIRTAZAPINE 30 MG TABLET: 30 | 30 days supply | Qty: 30 | Fill #2

## 2016-08-12 MED FILL — CYCLOBENZAPRINE 10 MG TAB: 10 | 30 days supply | Qty: 60 | Fill #0

## 2016-08-12 MED FILL — DIVALPROEX SOD 500 MG TAB D: 500 | 30 days supply | Qty: 120 | Fill #1

## 2016-08-12 MED FILL — HALOPERIDOL 5 MG TABLET: 5 | 30 days supply | Qty: 30 | Fill #2

## 2016-08-12 MED FILL — ?HYDROCHLOROTHIAZIDE 12.5 M: 12.5 | 30 days supply | Qty: 30 | Fill #0

## 2016-08-12 MED FILL — ?CETIRIZINE HCL 10 MG TABLE: 10 | 30 days supply | Qty: 30 | Fill #0

## 2016-08-12 NOTE — Patient Instructions (Signed)
Migraine Headache A migraine headache is an intense, throbbing pain on one side or both sides of the head. Migraines may also cause other symptoms, such as nausea, vomiting, and sensitivity to light and noise. What are the causes? Doing or taking certain things may also trigger migraines, such as:  Alcohol.  Smoking.  Medicines, such as: ? Medicine used to treat chest pain (nitroglycerine). ? Birth control pills. ? Estrogen pills. ? Certain blood pressure medicines.  Aged cheeses, chocolate, or caffeine.  Foods or drinks that contain nitrates, glutamate, aspartame, or tyramine.  Physical activity.  Other things that may trigger a migraine include:  Menstruation.  Pregnancy.  Hunger.  Stress, lack of sleep, too much sleep, or fatigue.  Weather changes.  What increases the risk? The following factors may make you more likely to experience migraine headaches:  Age. Risk increases with age.  Family history of migraine headaches.  Being Caucasian.  Depression and anxiety.  Obesity.  Being a woman.  Having a hole in the heart (patent foramen ovale) or other heart problems.  What are the signs or symptoms? The main symptom of this condition is pulsating or throbbing pain. Pain may:  Happen in any area of the head, such as on one side or both sides.  Interfere with daily activities.  Get worse with physical activity.  Get worse with exposure to bright lights or loud noises.  Other symptoms may include:  Nausea.  Vomiting.  Dizziness.  General sensitivity to bright lights, loud noises, or smells.  Before you get a migraine, you may get warning signs that a migraine is developing (aura). An aura may include:  Seeing flashing lights or having blind spots.  Seeing bright spots, halos, or zigzag lines.  Having tunnel vision or blurred vision.  Having numbness or a tingling feeling.  Having trouble talking.  Having muscle weakness.  How is this  diagnosed? A migraine headache can be diagnosed based on:  Your symptoms.  A physical exam.  Tests, such as CT scan or MRI of the head. These imaging tests can help rule out other causes of headaches.  Taking fluid from the spine (lumbar puncture) and analyzing it (cerebrospinal fluid analysis, or CSF analysis).  How is this treated? A migraine headache is usually treated with medicines that:  Relieve pain.  Relieve nausea.  Prevent migraines from coming back.  Treatment may also include:  Acupuncture.  Lifestyle changes like avoiding foods that trigger migraines.  Follow these instructions at home: Medicines  Take over-the-counter and prescription medicines only as told by your health care provider.  Do not drive or use heavy machinery while taking prescription pain medicine.  To prevent or treat constipation while you are taking prescription pain medicine, your health care provider may recommend that you: ? Drink enough fluid to keep your urine clear or pale yellow. ? Take over-the-counter or prescription medicines. ? Eat foods that are high in fiber, such as fresh fruits and vegetables, whole grains, and beans. ? Limit foods that are high in fat and processed sugars, such as fried and sweet foods. Lifestyle  Avoid alcohol use.  Do not use any products that contain nicotine or tobacco, such as cigarettes and e-cigarettes. If you need help quitting, ask your health care provider.  Get at least 8 hours of sleep every night.  Limit your stress. General instructions   Keep a journal to find out what may trigger your migraine headaches. For example, write down: ? What you eat and   drink. ? How much sleep you get. ? Any change to your diet or medicines.  If you have a migraine: ? Avoid things that make your symptoms worse, such as bright lights. ? It may help to lie down in a dark, quiet room. ? Do not drive or use heavy machinery. ? Ask your health care provider  what activities are safe for you while you are experiencing symptoms.  Keep all follow-up visits as told by your health care provider. This is important. Contact a health care provider if:  You develop symptoms that are different or more severe than your usual migraine symptoms. Get help right away if:  Your migraine becomes severe.  You have a fever.  You have a stiff neck.  You have vision loss.  Your muscles feel weak or like you cannot control them.  You start to lose your balance often.  You develop trouble walking.  You faint. This information is not intended to replace advice given to you by your health care provider. Make sure you discuss any questions you have with your health care provider. Document Released: 08/10/2005 Document Revised: 02/28/2016 Document Reviewed: 01/27/2016 Elsevier Interactive Patient Education  2017 [employer]   

## 2016-08-12 NOTE — Progress Notes (Signed)
Subjective:  Patient ID: Amanda [name], female    DOB: Oct 26, 1965  Age: 50 y.o. MRN: [id number]  CC: Headache (left sided, getting them for one month); Hypertension; and Manic Behavior   HPI Amanda [name] is a 50 year old female with a history of hypertension, bipolar disorder, neuropathy, chronic low back pain, PUD/GERD, carpal tunnel syndrome Symptoms who comes into the clinic for a follow-up visit.  She does have a history of migraines and has been on Topamax but complains of persistent headaches which occur daily. Denies blurry vision or photophobia or neck pains.  She was seen by a spine specialist (Dr Alvester Morin) and is status post epidural spinal injection but reports no relief of symptoms with injection. Complains of weight gain (has gained 9 pounds in the last 2 months) She is tachycardic today and has been compliant with her metoprolol; informs me she sometimes feels her heart pounding but denies chest pains or shortness of breath. Last TSH was normal.  She is requesting a letter to [employer] stating she has neuropathy and hypertension.  Past Medical History:  Diagnosis Date  . Anxiety   . Arthritis    lower back  . Asthma   . Bipolar disorder (HCC)   . Carpal tunnel syndrome, bilateral   . Chronic back pain   . Depression   . Diverticulosis   . Fx. left wrist   . GERD (gastroesophageal reflux disease)   . Heart murmur    "born with"  . Hypertension   . IBS (irritable bowel syndrome)   . Internal hemorrhoids   . Muscle spasms of neck   . Neuropathy (HCC)   . PUD (peptic ulcer disease)     Past Surgical History:  Procedure Laterality Date  . ARTHRODESIS METATARSALPHALANGEAL JOINT (MTPJ) Right 04/29/2016   Procedure: RIGHT GREAT TOE METATARSOPHALANGEAL JOINT (MTPJ) FUSION, HARDWARE REMOVAL;  Surgeon: Tarry Kos, MD;  Location: [facility];  Service: Orthopedics;  Laterality: Right;  RIGHT GREAT TOE METATARSOPHALANGEAL JOINT (MTPJ) FUSION,  HARDWARE REMOVAL  . CARPAL TUNNEL RELEASE Right 2003  . CHOLECYSTECTOMY N/A 02/09/2014   Procedure: LAPAROSCOPIC CHOLECYSTECTOMY WITH INTRAOPERATIVE CHOLANGIOGRAM;  Surgeon: Valarie Merino, MD;  Location: [facility];  Service: General;  Laterality: N/A;  . HAMMER TOE FUSION Bilateral 2008  . HARDWARE REMOVAL Right 04/29/2016   Procedure: HARDWARE REMOVAL;  Surgeon: Tarry Kos, MD;  Location: [facility];  Service: Orthopedics;  Laterality: Right;  HARDWARE REMOVAL  . ROTATOR CUFF REPAIR Right 2011  . WRIST FRACTURE SURGERY Left     Allergies  Allergen Reactions  . Penicillins Anaphylaxis    Has patient had a PCN reaction causing immediate rash, facial/tongue/throat swelling, SOB or lightheadedness with hypotension: Yes Has patient had a PCN reaction causing severe rash involving mucus membranes or skin necrosis: Yes Has patient had a PCN reaction that required hospitalization Yes Has patient had a PCN reaction occurring within the last 10 years: No-more than 10 years ago If all of the above answers are "NO", then may proceed with Cephalosporin use.   Jonne Ply [Aspirin] Rash     Outpatient Medications Prior to Visit  Medication Sig Dispense Refill  . colestipol (COLESTID) 1 g tablet Take 2 tablets (2 g total) by mouth every morning. 60 tablet 2  . divalproex (DEPAKOTE) 500 MG DR tablet Take 500 mg by mouth 3 (three) times daily.    Marland Kitchen gabapentin (NEURONTIN) 800 MG tablet Take 1 tablet (800 mg total)  by mouth 2 (two) times daily. 60 tablet 5  . haloperidol (HALDOL) 5 MG tablet Take 1 tablet by mouth daily.  0  . hydrOXYzine (ATARAX/VISTARIL) 50 MG tablet Take 1 tablet (50 mg total) by mouth 3 (three) times daily as needed for anxiety or itching. 90 tablet 2  . mirtazapine (REMERON) 30 MG tablet Take 45 mg by mouth at bedtime.    Marland Kitchen. olopatadine (PATANOL) 0.1 % ophthalmic solution PLACE 1 DROP INTO BOTH EYES 2 TIMES DAILY. 5 mL 0  . pantoprazole (PROTONIX) 40 MG tablet Take 1  tablet (40 mg total) by mouth daily. 30 tablet 3  . traMADol (ULTRAM) 50 MG tablet TAKE 1 TABLET BY MOUTH EVERY 12 HOURS AS NEEDED 60 tablet 1  . traZODone (DESYREL) 300 MG tablet Take 1 tablet (300 mg total) by mouth at bedtime. 30 tablet 0  . VENTOLIN HFA 108 (90 Base) MCG/ACT inhaler INHALE 1-2 PUFFS INTO THE LUNGS EVERY 6 HOURS AS NEEDED FOR WHEEZING OR SHORTNESS OF BREATH. 18 g 2  . cetirizine (ZYRTEC) 10 MG tablet TAKE 1 TABLET BY MOUTH DAILY. 30 tablet 2  . cyclobenzaprine (FLEXERIL) 10 MG tablet TAKE 1 TABLET BY MOUTH TWICE DAILY. 60 tablet 2  . hydrochlorothiazide (MICROZIDE) 12.5 MG capsule TAKE 1 CAPSULE BY MOUTH DAILY AS NEEDED FOR PEDAL EDEMA 30 capsule 5  . metoprolol (LOPRESSOR) 50 MG tablet Take 1 tablet (50 mg total) by mouth 2 (two) times daily. 60 tablet 3  . topiramate (TOPAMAX) 50 MG tablet Take 1 tablet (50 mg total) by mouth 2 (two) times daily. Patient 60 tablet 5  . cloNIDine (CATAPRES) 0.1 MG tablet Take 1 tablet (0.1 mg total) by mouth at bedtime. (Patient not taking: Reported on 08/12/2016) 60 tablet 2   Facility-Administered Medications Prior to Visit  Medication Dose Route Frequency Provider Last Rate Last Dose  . lidocaine (PF) (XYLOCAINE) 1 % injection 0.3 mL  0.3 mL Other Once Tyrell AntonioFrederic Newton, MD        ROS Review of Systems  Constitutional: Negative for activity change, appetite change and fatigue.  HENT: Negative for congestion, sinus pressure and sore throat.   Eyes: Negative for visual disturbance.  Respiratory: Negative for cough, chest tightness, shortness of breath and wheezing.   Cardiovascular: Positive for palpitations. Negative for chest pain.  Gastrointestinal: Negative for abdominal distention, abdominal pain and constipation.  Endocrine: Negative for polydipsia.  Genitourinary: Negative for dysuria and frequency.  Musculoskeletal: Positive for back pain. Negative for arthralgias.  Skin: Negative for rash.  Neurological: Positive for numbness  and headaches. Negative for tremors and light-headedness.  Hematological: Does not bruise/bleed easily.  Psychiatric/Behavioral: Negative for agitation and behavioral problems.    Objective:  BP 121/81 (BP Location: Right Arm, Patient Position: Sitting, Cuff Size: Large)   Pulse (!) 130   Temp 98.2 F (36.8 C) (Oral)   Ht 5\' 7"  (1.702 m)   Wt 224 lb (101.6 kg)   SpO2 98%   BMI 35.08 kg/m   BP/Weight 08/12/2016 07/06/2016 06/29/2016  Systolic BP 121 123 100  Diastolic BP 81 77 80  Wt. (Lbs) 224 - 217.38  BMI 35.08 - 34.05  Some encounter information is confidential and restricted. Go to Review Flowsheets activity to see all data.      Physical Exam  Constitutional: She is oriented to person, place, and time. She appears well-developed and well-nourished.  Cardiovascular: Normal rate, normal heart sounds and intact distal pulses.   No murmur heard. Pulmonary/Chest: Effort normal  and breath sounds normal. She has no wheezes. She has no rales. She exhibits no tenderness.  Abdominal: Soft. Bowel sounds are normal. She exhibits no distension and no mass. There is no tenderness.  Musculoskeletal: Normal range of motion.  Neurological: She is alert and oriented to person, place, and time.  Skin: Skin is warm and dry.  Psychiatric: She has a normal mood and affect.     Assessment & Plan:   1. Chronic migraine without aura without status migrainosus, not intractable Uncontrolled Increase dose of Topamax - topiramate (TOPAMAX) 50 MG tablet; Take 2 tablets (100 mg total) by mouth 2 (two) times daily.  Dispense: 120 tablet; Refill: 5  2. Essential hypertension Controlled - hydrochlorothiazide (MICROZIDE) 12.5 MG capsule; TAKE 1 CAPSULE BY MOUTH DAILY AS NEEDED FOR PEDAL EDEMA  Dispense: 30 capsule; Refill: 5  3. Tachycardia EKG reveals sinus tachycardia with a HR of 111 TSH normal Continue metoprolol  4. Class 2 obesity due to excess calories without serious comorbidity with  body mass index (BMI) of 35.0 to 35.9 in adult Recent steroid injections could be contributory Increase physical activity and reduce portion sizes  5. Neuropathy (HCC) Continue gabapentin   Meds ordered this encounter  Medications  . topiramate (TOPAMAX) 50 MG tablet    Sig: Take 2 tablets (100 mg total) by mouth 2 (two) times daily.    Dispense:  120 tablet    Refill:  5    Discontinue previous dose  . metoprolol (LOPRESSOR) 50 MG tablet    Sig: Take 1 tablet (50 mg total) by mouth 2 (two) times daily.    Dispense:  60 tablet    Refill:  3  . hydrochlorothiazide (MICROZIDE) 12.5 MG capsule    Sig: TAKE 1 CAPSULE BY MOUTH DAILY AS NEEDED FOR PEDAL EDEMA    Dispense:  30 capsule    Refill:  5  . cyclobenzaprine (FLEXERIL) 10 MG tablet    Sig: Take 1 tablet (10 mg total) by mouth 2 (two) times daily.    Dispense:  60 tablet    Refill:  2  . cetirizine (ZYRTEC) 10 MG tablet    Sig: Take 1 tablet (10 mg total) by mouth daily.    Dispense:  30 tablet    Refill:  2    Follow-up: Return in about 1 month (around 09/12/2016) for Follow-up on migraine.   Jaclyn ShaggyEnobong Amao MD

## 2016-08-13 ENCOUNTER — Telehealth (INDEPENDENT_AMBULATORY_CARE_PROVIDER_SITE_OTHER): Payer: Self-pay | Admitting: Orthopaedic Surgery

## 2016-08-13 DIAGNOSIS — M2021 Hallux rigidus, right foot: Secondary | ICD-10-CM

## 2016-08-13 MED FILL — GABAPENTIN 800 MG TABLET: 800 | 30 days supply | Qty: 90 | Fill #0

## 2016-08-13 MED FILL — traZODone HCL 150 MG TABS: 150 | 30 days supply | Qty: 30 | Fill #0

## 2016-08-13 NOTE — Telephone Encounter (Signed)
Patient called advised the injections that Dr Alvester MorinNewton gave her did not work. Patient asked what is her next plan of care with Dr Roda ShuttersXu. The number to contact her is [phone]

## 2016-08-13 NOTE — Telephone Encounter (Signed)
Pain clinic

## 2016-08-13 NOTE — Telephone Encounter (Signed)
Please advise. Thanks.  

## 2016-08-14 NOTE — Addendum Note (Signed)
Addended by: Albertina ParrGARCIA, Chesky Heyer on: 08/14/2016 09:12 AM   Modules accepted: Orders

## 2016-08-14 NOTE — Telephone Encounter (Signed)
Order put in. LMOM pt aware

## 2016-09-11 ENCOUNTER — Ambulatory Visit: Payer: Self-pay | Admitting: Family Medicine

## 2016-09-11 MED FILL — ?PANTOPRAZOLE SOD DR 40MG: 40 MG | 30 days supply | Qty: 30 | Fill #2

## 2016-09-11 MED FILL — METOPROLOL TARTRATE 50 MG T: 50 | 30 days supply | Qty: 60 | Fill #3

## 2016-09-15 MED FILL — ?HYDROCHLOROTHIAZIDE 12.5 M: 12.5 | 30 days supply | Qty: 30 | Fill #1

## 2016-09-15 MED FILL — GABAPENTIN 800 MG TABLET: 800 | 30 days supply | Qty: 90 | Fill #1

## 2016-09-15 MED FILL — ?TOPIRAMATE 50 MG TAB: 50 MG | 30 days supply | Qty: 120 | Fill #1

## 2016-09-15 MED FILL — CYCLOBENZAPRINE 10 MG TAB: 10 | 30 days supply | Qty: 60 | Fill #1

## 2016-09-15 MED FILL — ?CETIRIZINE HCL 10 MG TABLE: 10 | 30 days supply | Qty: 30 | Fill #1

## 2016-09-15 MED FILL — HYDROXYZINE PAM 50 MG CAP: 50 | 30 days supply | Qty: 120 | Fill #2

## 2016-09-15 MED FILL — traZODone HCL 150 MG TABS: 150 | 30 days supply | Qty: 30 | Fill #1

## 2016-09-15 MED FILL — HALOPERIDOL 5 MG TABLET: 5 | 30 days supply | Qty: 30 | Fill #0

## 2016-09-15 MED FILL — DIVALPROEX SOD 500 MG TAB D: 500 | 30 days supply | Qty: 120 | Fill #0

## 2016-09-15 MED FILL — MIRTAZAPINE 30 MG TABLET: 30 | 30 days supply | Qty: 30 | Fill #0

## 2016-09-18 ENCOUNTER — Ambulatory Visit: Payer: Self-pay | Admitting: Family Medicine

## 2016-09-21 ENCOUNTER — Ambulatory Visit: Payer: Self-pay | Admitting: Family Medicine

## 2016-09-24 ENCOUNTER — Encounter: Payer: Self-pay | Admitting: Internal Medicine

## 2016-09-24 ENCOUNTER — Encounter: Payer: Self-pay | Admitting: Family Medicine

## 2016-09-24 ENCOUNTER — Ambulatory Visit: Payer: Self-pay | Attending: Family Medicine | Admitting: Family Medicine

## 2016-09-24 ENCOUNTER — Ambulatory Visit (INDEPENDENT_AMBULATORY_CARE_PROVIDER_SITE_OTHER): Payer: Self-pay | Admitting: Internal Medicine

## 2016-09-24 VITALS — BP 112/78 | HR 59 | Ht 67.0 in | Wt 228.0 lb

## 2016-09-24 VITALS — BP 126/84 | HR 84 | Temp 97.6°F | Ht 67.0 in | Wt 231.6 lb

## 2016-09-24 DIAGNOSIS — K589 Irritable bowel syndrome without diarrhea: Secondary | ICD-10-CM | POA: Insufficient documentation

## 2016-09-24 DIAGNOSIS — Z8711 Personal history of peptic ulcer disease: Secondary | ICD-10-CM | POA: Insufficient documentation

## 2016-09-24 DIAGNOSIS — K219 Gastro-esophageal reflux disease without esophagitis: Secondary | ICD-10-CM

## 2016-09-24 DIAGNOSIS — K648 Other hemorrhoids: Secondary | ICD-10-CM | POA: Insufficient documentation

## 2016-09-24 DIAGNOSIS — J45909 Unspecified asthma, uncomplicated: Secondary | ICD-10-CM | POA: Insufficient documentation

## 2016-09-24 DIAGNOSIS — F419 Anxiety disorder, unspecified: Secondary | ICD-10-CM | POA: Insufficient documentation

## 2016-09-24 DIAGNOSIS — K529 Noninfective gastroenteritis and colitis, unspecified: Secondary | ICD-10-CM

## 2016-09-24 DIAGNOSIS — M545 Low back pain, unspecified: Secondary | ICD-10-CM

## 2016-09-24 DIAGNOSIS — G8929 Other chronic pain: Secondary | ICD-10-CM | POA: Insufficient documentation

## 2016-09-24 DIAGNOSIS — G5603 Carpal tunnel syndrome, bilateral upper limbs: Secondary | ICD-10-CM | POA: Insufficient documentation

## 2016-09-24 DIAGNOSIS — R531 Weakness: Secondary | ICD-10-CM | POA: Insufficient documentation

## 2016-09-24 DIAGNOSIS — Z880 Allergy status to penicillin: Secondary | ICD-10-CM | POA: Insufficient documentation

## 2016-09-24 DIAGNOSIS — F319 Bipolar disorder, unspecified: Secondary | ICD-10-CM | POA: Insufficient documentation

## 2016-09-24 DIAGNOSIS — I10 Essential (primary) hypertension: Secondary | ICD-10-CM | POA: Insufficient documentation

## 2016-09-24 DIAGNOSIS — G43709 Chronic migraine without aura, not intractable, without status migrainosus: Secondary | ICD-10-CM | POA: Insufficient documentation

## 2016-09-24 DIAGNOSIS — G629 Polyneuropathy, unspecified: Secondary | ICD-10-CM | POA: Insufficient documentation

## 2016-09-24 MED ORDER — COLESTIPOL HCL 1 G PO TABS: 1.0000 g | ORAL_TABLET | ORAL | 4 refills | Status: DC

## 2016-09-24 MED ORDER — TOPIRAMATE 100 MG PO TABS: 200.0000 mg | ORAL_TABLET | Freq: Two times a day (BID) | ORAL | 3 refills | Status: DC

## 2016-09-24 MED ORDER — PANTOPRAZOLE SODIUM 40 MG PO TBEC: 40.0000 mg | DELAYED_RELEASE_TABLET | Freq: Two times a day (BID) | ORAL | 3 refills | Status: DC

## 2016-09-24 MED ORDER — PREGABALIN 75 MG PO CAPS: 75.0000 mg | ORAL_CAPSULE | Freq: Two times a day (BID) | ORAL | 3 refills | Status: DC

## 2016-09-24 MED FILL — TOPIRAMATE 100 MG TABLET: 100 | 30 days supply | Qty: 120 | Fill #0

## 2016-09-24 MED FILL — COLESTIPOL HCL 1 GM TABLET: 1 | 30 days supply | Qty: 30 | Fill #0

## 2016-09-24 MED FILL — ?PANTOPRAZOLE SOD DR 40MG: 40 MG | 30 days supply | Qty: 60 | Fill #0

## 2016-09-24 NOTE — Patient Instructions (Signed)
Please increase your protonix to 40 mg twice daily before meals.  Decrease your colestipol from 2 grams to 1 gram daily  If you are age 52 or older, your body mass index should be between 23-30. Your Body mass index is 35.71 kg/m. If this is out of the aforementioned range listed, please consider follow up with your Primary Care Provider.  If you are age 52 or younger, your body mass index should be between 19-25. Your Body mass index is 35.71 kg/m. If this is out of the aformentioned range listed, please consider follow up with your Primary Care Provider.

## 2016-09-24 NOTE — Progress Notes (Signed)
Subjective:  Patient ID: Amanda [name], female    DOB: Jan 03, 1966  Age: 51 y.o. MRN: [id number]  CC: migraines; Back Pain (both legs R>L); and shakey (legs weak, "off balance")   HPI Amanda [name]  is a 51 year old female with a history of hypertension, bipolar disorder, neuropathy, chronic low back pain, PUD/GERD, carpal tunnel syndrome Symptoms who comes into the clinic for a follow-up visit.  She does have a history of migraines and has been on Topamax but complains of persistent headaches which occur daily. Denies blurry vision or photophobia or neck pains. She had an increase in dose of Topamax to 100 mg twice daily at her last office visit with no much relief in symptoms. Also complains her neuropathy and back pain are not controlled on gabapentin.  Past Medical History:  Diagnosis Date  . Anxiety   . Arthritis    lower back  . Asthma   . Bipolar disorder (HCC)   . Carpal tunnel syndrome, bilateral   . Chronic back pain   . Depression   . Diverticulosis   . Fx. left wrist   . GERD (gastroesophageal reflux disease)   . Heart murmur    "born with"  . Hypertension   . IBS (irritable bowel syndrome)   . Internal hemorrhoids   . Muscle spasms of neck   . Neuropathy (HCC)   . PUD (peptic ulcer disease)     Past Surgical History:  Procedure Laterality Date  . ARTHRODESIS METATARSALPHALANGEAL JOINT (MTPJ) Right 04/29/2016   Procedure: RIGHT GREAT TOE METATARSOPHALANGEAL JOINT (MTPJ) FUSION, HARDWARE REMOVAL;  Surgeon: Tarry Kos, MD;  Location: [facility];  Service: Orthopedics;  Laterality: Right;  RIGHT GREAT TOE METATARSOPHALANGEAL JOINT (MTPJ) FUSION, HARDWARE REMOVAL  . CARPAL TUNNEL RELEASE Right 2003  . CHOLECYSTECTOMY N/A 02/09/2014   Procedure: LAPAROSCOPIC CHOLECYSTECTOMY WITH INTRAOPERATIVE CHOLANGIOGRAM;  Surgeon: Valarie Merino, MD;  Location: [facility];  Service: General;  Laterality: N/A;  . HAMMER TOE FUSION Bilateral 2008  . HARDWARE  REMOVAL Right 04/29/2016   Procedure: HARDWARE REMOVAL;  Surgeon: Tarry Kos, MD;  Location: [facility];  Service: Orthopedics;  Laterality: Right;  HARDWARE REMOVAL  . ROTATOR CUFF REPAIR Right 2011  . WRIST FRACTURE SURGERY Left     Allergies  Allergen Reactions  . Penicillins Anaphylaxis    Has patient had a PCN reaction causing immediate rash, facial/tongue/throat swelling, SOB or lightheadedness with hypotension: Yes Has patient had a PCN reaction causing severe rash involving mucus membranes or skin necrosis: Yes Has patient had a PCN reaction that required hospitalization Yes Has patient had a PCN reaction occurring within the last 10 years: No-more than 10 years ago If all of the above answers are "NO", then may proceed with Cephalosporin use.   Jonne Ply [Aspirin] Rash     Outpatient Medications Prior to Visit  Medication Sig Dispense Refill  . cetirizine (ZYRTEC) 10 MG tablet Take 1 tablet (10 mg total) by mouth daily. 30 tablet 2  . cyclobenzaprine (FLEXERIL) 10 MG tablet Take 1 tablet (10 mg total) by mouth 2 (two) times daily. 60 tablet 2  . divalproex (DEPAKOTE) 500 MG DR tablet Take 500 mg by mouth 3 (three) times daily.    Marland Kitchen gabapentin (NEURONTIN) 800 MG tablet Take 1 tablet (800 mg total) by mouth 2 (two) times daily. 60 tablet 5  . haloperidol (HALDOL) 5 MG tablet Take 1 tablet by mouth daily.  0  .  hydrochlorothiazide (MICROZIDE) 12.5 MG capsule TAKE 1 CAPSULE BY MOUTH DAILY AS NEEDED FOR PEDAL EDEMA 30 capsule 5  . hydrOXYzine (ATARAX/VISTARIL) 50 MG tablet Take 1 tablet (50 mg total) by mouth 3 (three) times daily as needed for anxiety or itching. 90 tablet 2  . metoprolol (LOPRESSOR) 50 MG tablet Take 1 tablet (50 mg total) by mouth 2 (two) times daily. 60 tablet 3  . mirtazapine (REMERON) 30 MG tablet Take 45 mg by mouth at bedtime.    Marland Kitchen. olopatadine (PATANOL) 0.1 % ophthalmic solution PLACE 1 DROP INTO BOTH EYES 2 TIMES DAILY. 5 mL 0  . traZODone  (DESYREL) 300 MG tablet Take 1 tablet (300 mg total) by mouth at bedtime. 30 tablet 0  . VENTOLIN HFA 108 (90 Base) MCG/ACT inhaler INHALE 1-2 PUFFS INTO THE LUNGS EVERY 6 HOURS AS NEEDED FOR WHEEZING OR SHORTNESS OF BREATH. 18 g 2  . colestipol (COLESTID) 1 g tablet Take 2 tablets (2 g total) by mouth every morning. 60 tablet 2  . pantoprazole (PROTONIX) 40 MG tablet Take 1 tablet (40 mg total) by mouth daily. 30 tablet 3  . topiramate (TOPAMAX) 50 MG tablet Take 2 tablets (100 mg total) by mouth 2 (two) times daily. 120 tablet 5  . traMADol (ULTRAM) 50 MG tablet TAKE 1 TABLET BY MOUTH EVERY 12 HOURS AS NEEDED (Patient not taking: Reported on 09/24/2016) 60 tablet 1   Facility-Administered Medications Prior to Visit  Medication Dose Route Frequency Provider Last Rate Last Dose  . lidocaine (PF) (XYLOCAINE) 1 % injection 0.3 mL  0.3 mL Other Once Tyrell AntonioFrederic Newton, MD        ROS Review of Systems Constitutional: Negative for activity change, appetite change and fatigue.  HENT: Negative for congestion, sinus pressure and sore throat.   Eyes: Negative for visual disturbance.  Respiratory: Negative for cough, chest tightness, shortness of breath and wheezing.   Cardiovascular:. Negative for chest pain.  Gastrointestinal: Negative for abdominal distention, abdominal pain and constipation.  Endocrine: Negative for polydipsia.  Genitourinary: Negative for dysuria and frequency.  Musculoskeletal: Positive for back pain. Negative for arthralgias.  Skin: Negative for rash.  Neurological: Positive for numbness and headaches. Negative for tremors and light-headedness.  Hematological: Does not bruise/bleed easily.  Psychiatric/Behavioral: Negative for agitation and behavioral problems Objective:  BP 126/84 (BP Location: Right Arm, Patient Position: Sitting, Cuff Size: Large)   Pulse 84   Temp 97.6 F (36.4 C) (Oral)   Ht 5\' 7"  (1.702 m)   Wt 231 lb 9.6 oz (105.1 kg)   LMP 09/21/2016   SpO2 99%    BMI 36.27 kg/m   BP/Weight 09/24/2016 09/24/2016 08/12/2016  Systolic BP 126 112 121  Diastolic BP 84 78 81  Wt. (Lbs) 231.6 228 224  BMI 36.27 35.71 35.08  Some encounter information is confidential and restricted. Go to Review Flowsheets activity to see all data.      Physical Exam Constitutional: She is oriented to person, place, and time. She appears well-developed and well-nourished.  Cardiovascular: Normal rate, normal heart sounds and intact distal pulses.   No murmur heard. Pulmonary/Chest: Effort normal and breath sounds normal. She has no wheezes. She has no rales. She exhibits no tenderness.  Abdominal: Soft. Bowel sounds are normal. She exhibits no distension and no mass. There is no tenderness.  Musculoskeletal: Normal range of motion.  Neurological: She is alert and oriented to person, place, and time.  Skin: Skin is warm and dry.  Psychiatric: She has  a normal mood and affect  Assessment & Plan:   1. Chronic migraine without aura without status migrainosus, not intractable Increased dose of Topamax If symptoms persist I will add Elavil to her regimen - topiramate (TOPAMAX) 100 MG tablet; Take 2 tablets (200 mg total) by mouth 2 (two) times daily.  Dispense: 120 tablet; Refill: 3  2. Neuropathy (HCC) Placed on Lyrica which will need to be ordered. The medication assistance program We'll discontinue gabapentin once Lyrica is obtained   3. Chronic midline low back pain without sciatica We will also discuss possibly adding Cymbalta to control pain at her next visit   Meds ordered this encounter  Medications  . topiramate (TOPAMAX) 100 MG tablet    Sig: Take 2 tablets (200 mg total) by mouth 2 (two) times daily.    Dispense:  120 tablet    Refill:  3    Discontinue previous dose  . pregabalin (LYRICA) 75 MG capsule    Sig: Take 1 capsule (75 mg total) by mouth 2 (two) times daily.    Dispense:  60 capsule    Refill:  3    Follow-up: Return in about 1 month  (around 10/22/2016) for Follow up on migraines.   Jaclyn Shaggy MD

## 2016-09-24 NOTE — Progress Notes (Signed)
   Subjective:    Patient ID: Amanda [name], female    DOB: 06-Sep-1965, 51 y.o.   MRN: [id number]  HPI Amanda [name] is a 51 year old female with GERD, functional dyspepsia/epigastric pain, chronic diarrhea who is here for follow-up. She was last seen on 06/29/2016. She also has a history of bipolar disorder, migraines, substance abuse in remission, hypertension and chronic low back pain.  After her last visit we changed her omeprazole to pantoprazole 40 mg daily. This provided improvement in her GERD and dyspeptic symptoms however she is still noticed throat clearing as well as phlegm worse at night in the morning. Her dysphagia remains improved after dilation. Her epigastric pain has improved also.  Her loose stools were treated with colestipol 2 g daily and after taking this for a few days she became constipated and reports no bowel movement in a week. She discontinue the medication and her loose stools have returned. These are still postprandial in nature. No blood in her stool or melena.  Review of Systems As per history of present illness, otherwise negative  Current Medications, Allergies, Past Medical History, Past Surgical History, Family History and Social History were reviewed in [facility] electronic medical record.     Objective:   Physical Exam BP 112/78 (Cuff Size: Large)   Pulse (!) 59   Ht 5\' 7"  (1.702 m)   Wt 228 lb (103.4 kg)   LMP 09/21/2016   BMI 35.71 kg/m  Constitutional: Well-developed and well-nourished. No distress. HEENT: Normocephalic and atraumatic.  Conjunctivae are normal.  No scleral icterus. Neck: Neck supple. Trachea midline. Cardiovascular: Normal rate, regular rhythm and intact distal pulses.  Pulmonary/chest: Effort normal and breath sounds normal. No wheezing, rales or rhonchi. Abdominal: Soft, nontender, nondistended. Bowel sounds active throughout. There are no masses palpable. No hepatosplenomegaly. Extremities: no clubbing, cyanosis, or  edema Neurological: Alert and oriented to person place and time. Skin: Skin is warm and dry.  Psychiatric: Normal mood and flat affect. Behavior is normal.  EGD and colon: Performed on 02/18/2016 and 04/15/2016. EGD was unremarkable from a dysphagia respective though dilation was performed with a 51 Fr Savary dilator. Biopsies of the stomach were performed and duodenum was also biopsied but normal appearing. Biopsies from the esophagus, stomach and small bowel were normal. Colonoscopy revealed a few sigmoid diverticuli but was otherwise normal. Internal hemorrhoids are found on retroflexion. Biopsies were negative for microscopic colitis and IBD.     Assessment & Plan:  51 year old female with GERD, functional dyspepsia/epigastric pain, chronic diarrhea who is here for follow-up  1. GERD/Dyspepsia -- dyspeptic symptoms have improved as have reflux symptoms during the day. Some throat clearing and phlegm at night which may be secondary to uncontrolled reflux in the evening and overnight. We reviewed not eating within 2-3 hours of lying down. I advised that she probably had of her bed. Will increase pantoprazole to 40 mg twice a day before meals.  2. Chronic diarrhea -- colestipol 2 g caused constipation. Decrease colestipol to 1 g daily. Separate from other medications by 2 hours.  6 month follow-up, sooner if necessary 15 minutes spent with the patient today. Greater than 50% was spent in counseling and coordination of care with the patient

## 2016-10-03 ENCOUNTER — Other Ambulatory Visit: Payer: Self-pay | Admitting: Family Medicine

## 2016-10-03 DIAGNOSIS — Z1231 Encounter for screening mammogram for malignant neoplasm of breast: Secondary | ICD-10-CM

## 2016-10-06 ENCOUNTER — Ambulatory Visit: Payer: Self-pay | Attending: Family Medicine

## 2016-10-14 MED FILL — traZODone HCL 150 MG TABS: 150 | 30 days supply | Qty: 60 | Fill #0

## 2016-10-14 MED FILL — GABAPENTIN 800 MG TABLET: 800 | 30 days supply | Qty: 90 | Fill #0

## 2016-10-14 MED FILL — ?MIRTAZAPINE 30 MG TABLET: 30 | 30 days supply | Qty: 30 | Fill #0

## 2016-10-14 MED FILL — DIVALPROEX SOD DR 500 MG TA: 500 | 30 days supply | Qty: 90 | Fill #0

## 2016-10-14 MED FILL — ?HALOPERIDOL 5 MG TABLET: 5 | 30 days supply | Qty: 30 | Fill #0

## 2016-10-19 MED FILL — HYDROXYZINE PAM 50 MG CAP: 50 | 30 days supply | Qty: 120 | Fill #2

## 2016-10-19 MED FILL — ?CETIRIZINE HCL 10 MG TABLE: 10 | 30 days supply | Qty: 30 | Fill #2

## 2016-10-19 MED FILL — DIVALPROEX SOD 500 MG TAB D: 500 | 30 days supply | Qty: 120 | Fill #1

## 2016-10-19 MED FILL — MIRTAZAPINE 30 MG TABLET: 30 | 30 days supply | Qty: 30 | Fill #1

## 2016-10-19 MED FILL — HALOPERIDOL 5 MG TABLET: 5 | 30 days supply | Qty: 30 | Fill #1

## 2016-10-19 MED FILL — ?HYDROCHLOROTHIAZIDE 12.5 M: 12.5 | 30 days supply | Qty: 30 | Fill #2

## 2016-10-19 MED FILL — traZODone HCL 150 MG TABS: 150 | 30 days supply | Qty: 30 | Fill #2

## 2016-10-19 MED FILL — GABAPENTIN 800 MG TABLET: 800 | 30 days supply | Qty: 90 | Fill #2

## 2016-10-19 MED FILL — METOPROLOL TARTRATE 50 MG T: 50 | 30 days supply | Qty: 60 | Fill #0

## 2016-10-19 MED FILL — CYCLOBENZAPRINE 10 MG TAB: 10 | 30 days supply | Qty: 60 | Fill #2

## 2016-10-20 ENCOUNTER — Ambulatory Visit
Admission: RE | Admit: 2016-10-20 | Discharge: 2016-10-20 | Disposition: A | Discharge status: Home / Self Care | Payer: No Typology Code available for payment source | Source: Ambulatory Visit | Attending: Family Medicine | Admitting: Family Medicine

## 2016-10-20 DIAGNOSIS — Z1231 Encounter for screening mammogram for malignant neoplasm of breast: Secondary | ICD-10-CM

## 2016-10-20 MED FILL — VENTOLIN HFA 90 MCG INHALER: 108 (90 BAS | 20 days supply | Qty: 18 | Fill #1

## 2016-10-22 ENCOUNTER — Telehealth: Payer: Self-pay

## 2016-10-22 NOTE — Telephone Encounter (Signed)
-----   Message from Jaclyn ShaggyEnobong Amao, MD sent at 10/20/2016  1:47 PM EST ----- Mammogram is negative for malignancy

## 2016-10-22 NOTE — Telephone Encounter (Signed)
Writer called patient and discussed her mammogram results.  Patient stated understanding. 

## 2016-10-23 ENCOUNTER — Ambulatory Visit: Payer: Self-pay | Attending: Family Medicine | Admitting: Family Medicine

## 2016-10-23 ENCOUNTER — Encounter: Payer: Self-pay | Admitting: Family Medicine

## 2016-10-23 VITALS — BP 127/83 | HR 79 | Temp 97.6°F | Resp 18 | Ht 67.0 in | Wt 228.0 lb

## 2016-10-23 DIAGNOSIS — G5603 Carpal tunnel syndrome, bilateral upper limbs: Secondary | ICD-10-CM | POA: Insufficient documentation

## 2016-10-23 DIAGNOSIS — G8929 Other chronic pain: Secondary | ICD-10-CM | POA: Insufficient documentation

## 2016-10-23 DIAGNOSIS — K219 Gastro-esophageal reflux disease without esophagitis: Secondary | ICD-10-CM | POA: Insufficient documentation

## 2016-10-23 DIAGNOSIS — Z880 Allergy status to penicillin: Secondary | ICD-10-CM | POA: Insufficient documentation

## 2016-10-23 DIAGNOSIS — J45909 Unspecified asthma, uncomplicated: Secondary | ICD-10-CM | POA: Insufficient documentation

## 2016-10-23 DIAGNOSIS — F319 Bipolar disorder, unspecified: Secondary | ICD-10-CM | POA: Insufficient documentation

## 2016-10-23 DIAGNOSIS — I10 Essential (primary) hypertension: Secondary | ICD-10-CM | POA: Insufficient documentation

## 2016-10-23 DIAGNOSIS — K648 Other hemorrhoids: Secondary | ICD-10-CM | POA: Insufficient documentation

## 2016-10-23 DIAGNOSIS — G629 Polyneuropathy, unspecified: Secondary | ICD-10-CM | POA: Insufficient documentation

## 2016-10-23 DIAGNOSIS — M545 Low back pain: Secondary | ICD-10-CM | POA: Insufficient documentation

## 2016-10-23 DIAGNOSIS — G43709 Chronic migraine without aura, not intractable, without status migrainosus: Secondary | ICD-10-CM | POA: Insufficient documentation

## 2016-10-23 DIAGNOSIS — K589 Irritable bowel syndrome without diarrhea: Secondary | ICD-10-CM | POA: Insufficient documentation

## 2016-10-23 DIAGNOSIS — M62838 Other muscle spasm: Secondary | ICD-10-CM | POA: Insufficient documentation

## 2016-10-23 DIAGNOSIS — Z8711 Personal history of peptic ulcer disease: Secondary | ICD-10-CM | POA: Insufficient documentation

## 2016-10-23 DIAGNOSIS — F419 Anxiety disorder, unspecified: Secondary | ICD-10-CM | POA: Insufficient documentation

## 2016-10-23 MED ORDER — CYCLOBENZAPRINE HCL 10 MG PO TABS: 10.0000 mg | ORAL_TABLET | Freq: Two times a day (BID) | ORAL | 2 refills | Status: DC

## 2016-10-23 MED ORDER — ALBUTEROL SULFATE HFA 108 (90 BASE) MCG/ACT IN AERS: 2.0000 | INHALATION_SPRAY | Freq: Four times a day (QID) | RESPIRATORY_TRACT | 1 refills | Status: DC | PRN

## 2016-10-23 MED ORDER — CETIRIZINE HCL 10 MG PO TABS: 10.0000 mg | ORAL_TABLET | Freq: Every day | ORAL | 2 refills | Status: DC

## 2016-10-23 MED ORDER — METOPROLOL TARTRATE 50 MG PO TABS
50.0000 mg | ORAL_TABLET | Freq: Two times a day (BID) | ORAL | 3 refills | Status: DC
Start: 2016-10-23 — End: 2016-12-23

## 2016-10-23 MED ORDER — SUMATRIPTAN SUCCINATE 50 MG PO TABS: ORAL_TABLET | ORAL | 2 refills | Status: DC

## 2016-10-23 NOTE — Progress Notes (Signed)
Patient is here for FU migraines  Patient denies pain at this time.  Patient has taken medication today. Patient has eaten today.  Patient declined the flu vaccine today.

## 2016-10-23 NOTE — Progress Notes (Signed)
Subjective:  Patient ID: Amanda [name], female    DOB: 08-26-1965  Age: 51 y.o. MRN: [id number]  CC: Migraine   HPI Amanda FillersDonna A Roediger is a 51 year old female with a history of hypertension, bipolar disorder, neuropathy, chronic low back pain, PUD/GERD, Migraine, carpal tunnel syndrome Who comes in today for follow-up of her migraine.  At her last office visit Topamax had been increased to 200 mg twice daily and she reports decrease in intensity of the headaches but still has intermittent headaches which are mild to moderate but she is uncertain of the frequency. She has occasional nausea and photophobia with the headaches but no vomiting.  Past Medical History:  Diagnosis Date  . Anxiety   . Arthritis    lower back  . Asthma   . Bipolar disorder (HCC)   . Carpal tunnel syndrome, bilateral   . Chronic back pain   . Depression   . Diverticulosis   . Fx. left wrist   . GERD (gastroesophageal reflux disease)   . Heart murmur    "born with"  . Hypertension   . IBS (irritable bowel syndrome)   . Internal hemorrhoids   . Muscle spasms of neck   . Neuropathy (HCC)   . PUD (peptic ulcer disease)     Past Surgical History:  Procedure Laterality Date  . ARTHRODESIS METATARSALPHALANGEAL JOINT (MTPJ) Right 04/29/2016   Procedure: RIGHT GREAT TOE METATARSOPHALANGEAL JOINT (MTPJ) FUSION, HARDWARE REMOVAL;  Surgeon: Tarry KosNaiping M Xu, MD;  Location: [facility];  Service: Orthopedics;  Laterality: Right;  RIGHT GREAT TOE METATARSOPHALANGEAL JOINT (MTPJ) FUSION, HARDWARE REMOVAL  . CARPAL TUNNEL RELEASE Right 2003  . CHOLECYSTECTOMY N/A 02/09/2014   Procedure: LAPAROSCOPIC CHOLECYSTECTOMY WITH INTRAOPERATIVE CHOLANGIOGRAM;  Surgeon: Valarie MerinoMatthew B Martin, MD;  Location: [facility];  Service: General;  Laterality: N/A;  . HAMMER TOE FUSION Bilateral 2008  . HARDWARE REMOVAL Right 04/29/2016   Procedure: HARDWARE REMOVAL;  Surgeon: Tarry KosNaiping M Xu, MD;  Location: [facility];   Service: Orthopedics;  Laterality: Right;  HARDWARE REMOVAL  . ROTATOR CUFF REPAIR Right 2011  . WRIST FRACTURE SURGERY Left     Allergies  Allergen Reactions  . Penicillins Anaphylaxis    Has patient had a PCN reaction causing immediate rash, facial/tongue/throat swelling, SOB or lightheadedness with hypotension: Yes Has patient had a PCN reaction causing severe rash involving mucus membranes or skin necrosis: Yes Has patient had a PCN reaction that required hospitalization Yes Has patient had a PCN reaction occurring within the last 10 years: No-more than 10 years ago If all of the above answers are "NO", then may proceed with Cephalosporin use.   Jonne Ply. Asa [Aspirin] Rash     Outpatient Medications Prior to Visit  Medication Sig Dispense Refill  . colestipol (COLESTID) 1 g tablet Take 1 tablet (1 g total) by mouth every morning. 30 tablet 4  . divalproex (DEPAKOTE) 500 MG DR tablet Take 500 mg by mouth 3 (three) times daily.    Marland Kitchen. gabapentin (NEURONTIN) 800 MG tablet Take 1 tablet (800 mg total) by mouth 2 (two) times daily. 60 tablet 5  . haloperidol (HALDOL) 5 MG tablet Take 1 tablet by mouth daily.  0  . hydrochlorothiazide (MICROZIDE) 12.5 MG capsule TAKE 1 CAPSULE BY MOUTH DAILY AS NEEDED FOR PEDAL EDEMA 30 capsule 5  . hydrOXYzine (ATARAX/VISTARIL) 50 MG tablet Take 1 tablet (50 mg total) by mouth 3 (three) times daily as needed for anxiety or itching. 90  tablet 2  . mirtazapine (REMERON) 30 MG tablet Take 45 mg by mouth at bedtime.    Marland Kitchen olopatadine (PATANOL) 0.1 % ophthalmic solution PLACE 1 DROP INTO BOTH EYES 2 TIMES DAILY. 5 mL 0  . pantoprazole (PROTONIX) 40 MG tablet Take 1 tablet (40 mg total) by mouth 2 (two) times daily before a meal. 60 tablet 3  . topiramate (TOPAMAX) 100 MG tablet Take 2 tablets (200 mg total) by mouth 2 (two) times daily. 120 tablet 3  . traZODone (DESYREL) 300 MG tablet Take 1 tablet (300 mg total) by mouth at bedtime. 30 tablet 0  . cetirizine  (ZYRTEC) 10 MG tablet Take 1 tablet (10 mg total) by mouth daily. 30 tablet 2  . cyclobenzaprine (FLEXERIL) 10 MG tablet Take 1 tablet (10 mg total) by mouth 2 (two) times daily. 60 tablet 2  . metoprolol (LOPRESSOR) 50 MG tablet Take 1 tablet (50 mg total) by mouth 2 (two) times daily. 60 tablet 3  . VENTOLIN HFA 108 (90 Base) MCG/ACT inhaler INHALE 1-2 PUFFS INTO THE LUNGS EVERY 6 HOURS AS NEEDED FOR WHEEZING OR SHORTNESS OF BREATH. 18 g 2  . pregabalin (LYRICA) 75 MG capsule Take 1 capsule (75 mg total) by mouth 2 (two) times daily. (Patient not taking: Reported on 10/23/2016) 60 capsule 3   Facility-Administered Medications Prior to Visit  Medication Dose Route Frequency Provider Last Rate Last Dose  . lidocaine (PF) (XYLOCAINE) 1 % injection 0.3 mL  0.3 mL Other Once Tyrell Antonio, MD        ROS Review of Systems Constitutional: Negative for activity change, appetite change and fatigue.  HENT: Negative for congestion, sinus pressure and sore throat.   Eyes: Negative for visual disturbance.  Respiratory: Negative for cough, chest tightness, shortness of breath and wheezing.   Cardiovascular:. Negative for chest pain.  Gastrointestinal: Negative for abdominal distention, abdominal pain and constipation.  Endocrine: Negative for polydipsia.  Genitourinary: Negative for dysuria and frequency.  Musculoskeletal: Positive for back pain. Negative for arthralgias.  Skin: Negative for rash.  Neurological: Positive for numbness and headaches. Negative for tremors and light-headedness.  Hematological: Does not bruise/bleed easily.  Psychiatric/Behavioral: Negative for agitation and behavioral problems  Objective:  BP 127/83   Pulse 79   Temp 97.6 F (36.4 C) (Oral)   Resp 18   Ht 5\' 7"  (1.702 m)   Wt 228 lb (103.4 kg)   SpO2 99%   BMI 35.71 kg/m   BP/Weight 10/23/2016 09/24/2016 09/24/2016  Systolic BP 127 126 112  Diastolic BP 83 84 78  Wt. (Lbs) 228 231.6 228  BMI 35.71 36.27 35.71    Some encounter information is confidential and restricted. Go to Review Flowsheets activity to see all data.      Physical Exam Constitutional: She is oriented to person, place, and time. She appears well-developed and well-nourished.  Cardiovascular: Normal rate, normal heart sounds and intact distal pulses.   No murmur heard. Pulmonary/Chest: Effort normal and breath sounds normal. She has no wheezes. She has no rales. She exhibits no tenderness.  Abdominal: Soft. Bowel sounds are normal. She exhibits no distension and no mass. There is no tenderness.  Musculoskeletal: Normal range of motion.  Neurological: She is alert and oriented to person, place, and time.  Skin: Skin is warm and dry.  Psychiatric: She has a normal mood and affect  Assessment & Plan:   1. Essential hypertension Controlled - metoprolol (LOPRESSOR) 50 MG tablet; Take 1 tablet (50 mg  total) by mouth 2 (two) times daily.  Dispense: 60 tablet; Refill: 3 - COMPLETE METABOLIC PANEL WITH GFR; Future - Lipid Panel w/reflex Direct LDL; Future  2. Chronic migraine without aura without status migrainosus, not intractable Imitrex added for breakthrough Continue Topamax She is on a beta blocker which should also help with prophylaxis Avoid foods that trigger migraines. - SUMAtriptan (IMITREX) 50 MG tablet; Take 50 mg orally at the onset of a migraine. May repeat in 2 hours if headache persists or recurs. Maximum daily dose 200 mg  Dispense: 10 tablet; Refill: 2   Meds ordered this encounter  Medications  . cetirizine (ZYRTEC) 10 MG tablet    Sig: Take 1 tablet (10 mg total) by mouth daily.    Dispense:  30 tablet    Refill:  2  . cyclobenzaprine (FLEXERIL) 10 MG tablet    Sig: Take 1 tablet (10 mg total) by mouth 2 (two) times daily.    Dispense:  60 tablet    Refill:  2  . metoprolol (LOPRESSOR) 50 MG tablet    Sig: Take 1 tablet (50 mg total) by mouth 2 (two) times daily.    Dispense:  60 tablet    Refill:  3   . albuterol (PROAIR HFA) 108 (90 Base) MCG/ACT inhaler    Sig: Inhale 2 puffs into the lungs every 6 (six) hours as needed for wheezing or shortness of breath.    Dispense:  1 Inhaler    Refill:  1  . SUMAtriptan (IMITREX) 50 MG tablet    Sig: Take 50 mg orally at the onset of a migraine. May repeat in 2 hours if headache persists or recurs. Maximum daily dose 200 mg    Dispense:  10 tablet    Refill:  2    Follow-up: Return in about 2 months (around 12/23/2016) for Follow-up on chronic medical conditions.   Jaclyn Shaggy MD

## 2016-10-27 ENCOUNTER — Other Ambulatory Visit: Payer: Self-pay | Admitting: *Deleted

## 2016-10-27 MED ORDER — PREGABALIN 75 MG PO CAPS: 75.0000 mg | ORAL_CAPSULE | Freq: Two times a day (BID) | ORAL | 3 refills | Status: DC

## 2016-10-27 MED FILL — ?PANTOPRAZOLE SOD DR 40MG: 40 MG | 30 days supply | Qty: 60 | Fill #1

## 2016-10-27 NOTE — Telephone Encounter (Signed)
PRINTED FOR PASS PROGRAM 

## 2016-10-28 ENCOUNTER — Other Ambulatory Visit: Payer: Self-pay

## 2016-11-06 MED FILL — SUMATRIPTAN SUCC 50 MG TAB: 50 | 30 days supply | Qty: 9 | Fill #0

## 2016-11-16 ENCOUNTER — Other Ambulatory Visit: Payer: Self-pay | Admitting: Family Medicine

## 2016-11-16 MED FILL — traZODone HCL 150 MG TABS: 150 | 30 days supply | Qty: 60 | Fill #1

## 2016-11-16 MED FILL — DIVALPROEX SOD DR 500 MG TA: 500 | 30 days supply | Qty: 90 | Fill #1

## 2016-11-16 MED FILL — ?CYCLOBENZAPRINE 10 MG TABL: 10 | 30 days supply | Qty: 60 | Fill #0

## 2016-11-16 MED FILL — TOPIRAMATE 100 MG TABLET: 100 | 30 days supply | Qty: 120 | Fill #1

## 2016-11-16 MED FILL — ?HALOPERIDOL 5 MG TABLET: 5 | 30 days supply | Qty: 30 | Fill #1

## 2016-11-16 MED FILL — ?HYDROCHLOROTHIAZIDE 12.5MG: 12.5 | 30 days supply | Qty: 30 | Fill #3

## 2016-11-16 MED FILL — ?METOPROLOL 50 MG TABLET: 50 | 30 days supply | Qty: 60 | Fill #1

## 2016-11-16 MED FILL — ?MIRTAZAPINE 30 MG TABLET: 30 | 30 days supply | Qty: 30 | Fill #2

## 2016-11-16 MED FILL — GABAPENTIN 800 MG TABLET: 800 | 30 days supply | Qty: 90 | Fill #1

## 2016-11-16 MED FILL — !VENTOLIN HFA INHALER: 108 (90 BAS | 25 days supply | Qty: 18 | Fill #2

## 2016-11-16 MED FILL — HYDROXYZINE PAM 50 MG CAP: 50 | 30 days supply | Qty: 120 | Fill #3

## 2016-12-14 ENCOUNTER — Ambulatory Visit: Payer: Self-pay

## 2016-12-15 ENCOUNTER — Ambulatory Visit: Payer: Self-pay | Attending: Family Medicine

## 2016-12-15 ENCOUNTER — Other Ambulatory Visit: Payer: Self-pay | Admitting: Family Medicine

## 2016-12-15 DIAGNOSIS — J302 Other seasonal allergic rhinitis: Secondary | ICD-10-CM

## 2016-12-15 MED FILL — HYDROXYZINE PAM 50 MG CAP: 50 | 30 days supply | Qty: 120 | Fill #4

## 2016-12-15 MED FILL — !VENTOLIN HFA INHALER: 108 (90 BAS | 25 days supply | Qty: 18 | Fill #0

## 2016-12-15 MED FILL — DIVALPROEX SOD DR 500 MG TA: 500 | 30 days supply | Qty: 90 | Fill #2

## 2016-12-15 MED FILL — METOPROLOL TARTRATE 50 MG T: 50 | 30 days supply | Qty: 60 | Fill #2

## 2016-12-15 MED FILL — traZODone HCL 150 MG TABS: 150 | 30 days supply | Qty: 60 | Fill #2

## 2016-12-15 MED FILL — ?HALOPERIDOL 5 MG TABLET: 5 | 30 days supply | Qty: 30 | Fill #2

## 2016-12-15 MED FILL — PANTOPRAZOLE SOD DR 40 MG T: 40 | 30 days supply | Qty: 60 | Fill #2

## 2016-12-15 MED FILL — ?HYDROCHLOROTHIAZIDE 12.5MG: 12.5 | 30 days supply | Qty: 30 | Fill #4

## 2016-12-15 MED FILL — OLOPATADINE HCL 0.1% EYE DR: 0.1 | 10 days supply | Qty: 5 | Fill #0

## 2016-12-15 MED FILL — ?MIRTAZAPINE 30 MG TABLET: 30 | 30 days supply | Qty: 30 | Fill #1

## 2016-12-15 MED FILL — TOPIRAMATE 100 MG TABLET: 100 | 30 days supply | Qty: 120 | Fill #2

## 2016-12-15 MED FILL — GABAPENTIN 800 MG TABLET: 800 | 30 days supply | Qty: 90 | Fill #2

## 2016-12-15 MED FILL — ?CYCLOBENZAPRINE 10 MG TABL: 10 | 30 days supply | Qty: 60 | Fill #1

## 2016-12-15 MED FILL — SUMATRIPTAN SUCC 50 MG TAB: 50 | 30 days supply | Qty: 9 | Fill #1

## 2016-12-18 ENCOUNTER — Telehealth: Payer: Self-pay | Admitting: Internal Medicine

## 2016-12-18 ENCOUNTER — Ambulatory Visit: Payer: Self-pay | Attending: Family Medicine

## 2016-12-18 DIAGNOSIS — I10 Essential (primary) hypertension: Secondary | ICD-10-CM

## 2016-12-18 DIAGNOSIS — K8000 Calculus of gallbladder with acute cholecystitis without obstruction: Secondary | ICD-10-CM

## 2016-12-18 MED ORDER — RANITIDINE HCL 150 MG PO CAPS: 150.0000 mg | ORAL_CAPSULE | Freq: Every evening | ORAL | 3 refills | Status: DC

## 2016-12-18 MED ORDER — DEXLANSOPRAZOLE 60 MG PO CPDR: 60.0000 mg | DELAYED_RELEASE_CAPSULE | Freq: Every day | ORAL | 3 refills | Status: DC

## 2016-12-18 NOTE — Telephone Encounter (Signed)
If coverage available can try Dexilant 60 mg daily and Zantac 150 mg every evening as needed.  Dexilant replaces pantoprazole

## 2016-12-18 NOTE — Telephone Encounter (Signed)
Pt states she is taking protonix  BID and reports it is not working. Pt states she can feel burning liquid coming back in her throat. Pt is asking for something different. Please advise.

## 2016-12-18 NOTE — Telephone Encounter (Signed)
Spoke with pt and she is aware. Scripts sent to the pharmacy.

## 2016-12-19 LAB — CMP14+EGFR
ALT: 42 IU/L — ABNORMAL HIGH (ref 0–32)
AST: 24 IU/L (ref 0–40)
Albumin/Globulin Ratio: 1.6 (ref 1.2–2.2)
Albumin: 4.2 g/dL (ref 3.5–5.5)
Alkaline Phosphatase: 99 IU/L (ref 39–117)
BUN/Creatinine Ratio: 11 (ref 9–23)
BUN: 9 mg/dL (ref 6–24)
Bilirubin Total: <0.2 mg/dL (ref 0.0–1.2)
CO2: 18 mmol/L (ref 18–29)
Calcium: 9.6 mg/dL (ref 8.7–10.2)
Chloride: 106 mmol/L (ref 96–106)
Creatinine, Ser: 0.83 mg/dL (ref 0.57–1.00)
GFR calc Af Amer: 94 mL/min/{1.73_m2} (ref >59)
GFR calc non Af Amer: 82 mL/min/{1.73_m2} (ref >59)
Globulin, Total: 2.6 g/dL (ref 1.5–4.5)
Glucose: 119 mg/dL — ABNORMAL HIGH (ref 65–99)
Potassium: 4.1 mmol/L (ref 3.5–5.2)
Sodium: 141 mmol/L (ref 134–144)
Total Protein: 6.8 g/dL (ref 6.0–8.5)

## 2016-12-19 LAB — LIPID PANEL
Chol/HDL Ratio: 3.8 ratio (ref 0.0–4.4)
Cholesterol, Total: 234 mg/dL — ABNORMAL HIGH (ref 100–199)
HDL: 62 mg/dL (ref >39)
LDL Calculated: 143 mg/dL — ABNORMAL HIGH (ref 0–99)
Triglycerides: 144 mg/dL (ref 0–149)
VLDL Cholesterol Cal: 29 mg/dL (ref 5–40)

## 2016-12-21 ENCOUNTER — Other Ambulatory Visit: Payer: Self-pay | Admitting: Family Medicine

## 2016-12-21 MED ORDER — ATORVASTATIN CALCIUM 20 MG PO TABS: 20.0000 mg | ORAL_TABLET | Freq: Every day | ORAL | 3 refills | Status: DC

## 2016-12-21 MED FILL — ATORVASTATIN 20 MG TABLET: 20 | 30 days supply | Qty: 30 | Fill #0

## 2016-12-21 MED FILL — raNITIdine HCL 150 MG TABS: 150 | 30 days supply | Qty: 30 | Fill #0

## 2016-12-22 ENCOUNTER — Telehealth: Payer: Self-pay

## 2016-12-22 NOTE — Telephone Encounter (Signed)
-----   Message from Jaclyn Shaggy, MD sent at 12/21/2016  1:34 PM EDT ----- Cholesterol is elevated; I have sent a prescription for atorvastatin to her pharmacy.

## 2016-12-22 NOTE — Telephone Encounter (Signed)
Writer called and spoke with patient regarding her lab results.  Patient aware she has to pick up medication and has an appt tomorrow here- she will get it then.

## 2016-12-23 ENCOUNTER — Ambulatory Visit: Payer: Self-pay | Attending: Family Medicine | Admitting: Family Medicine

## 2016-12-23 ENCOUNTER — Encounter: Payer: Self-pay | Admitting: Family Medicine

## 2016-12-23 VITALS — BP 163/108 | HR 85 | Temp 97.5°F | Resp 16 | Ht 67.0 in | Wt 233.0 lb

## 2016-12-23 DIAGNOSIS — G5603 Carpal tunnel syndrome, bilateral upper limbs: Secondary | ICD-10-CM | POA: Insufficient documentation

## 2016-12-23 DIAGNOSIS — K589 Irritable bowel syndrome without diarrhea: Secondary | ICD-10-CM | POA: Insufficient documentation

## 2016-12-23 DIAGNOSIS — F319 Bipolar disorder, unspecified: Secondary | ICD-10-CM | POA: Insufficient documentation

## 2016-12-23 DIAGNOSIS — E7800 Pure hypercholesterolemia, unspecified: Secondary | ICD-10-CM | POA: Insufficient documentation

## 2016-12-23 DIAGNOSIS — G43709 Chronic migraine without aura, not intractable, without status migrainosus: Secondary | ICD-10-CM | POA: Insufficient documentation

## 2016-12-23 DIAGNOSIS — K219 Gastro-esophageal reflux disease without esophagitis: Secondary | ICD-10-CM | POA: Insufficient documentation

## 2016-12-23 DIAGNOSIS — J45909 Unspecified asthma, uncomplicated: Secondary | ICD-10-CM | POA: Insufficient documentation

## 2016-12-23 DIAGNOSIS — F419 Anxiety disorder, unspecified: Secondary | ICD-10-CM | POA: Insufficient documentation

## 2016-12-23 DIAGNOSIS — E785 Hyperlipidemia, unspecified: Secondary | ICD-10-CM | POA: Insufficient documentation

## 2016-12-23 DIAGNOSIS — I10 Essential (primary) hypertension: Secondary | ICD-10-CM | POA: Insufficient documentation

## 2016-12-23 DIAGNOSIS — G8929 Other chronic pain: Secondary | ICD-10-CM | POA: Insufficient documentation

## 2016-12-23 DIAGNOSIS — Z79899 Other long term (current) drug therapy: Secondary | ICD-10-CM | POA: Insufficient documentation

## 2016-12-23 DIAGNOSIS — M545 Low back pain: Secondary | ICD-10-CM | POA: Insufficient documentation

## 2016-12-23 DIAGNOSIS — K648 Other hemorrhoids: Secondary | ICD-10-CM | POA: Insufficient documentation

## 2016-12-23 MED ORDER — METOPROLOL TARTRATE 100 MG PO TABS: 100.0000 mg | ORAL_TABLET | Freq: Two times a day (BID) | ORAL | 3 refills | Status: DC

## 2016-12-23 MED ORDER — HYDROCHLOROTHIAZIDE 25 MG PO TABS: 25.0000 mg | ORAL_TABLET | Freq: Every day | ORAL | 3 refills | Status: DC

## 2016-12-23 MED FILL — ?METOPROLOL 100 MG TABLET: 100 | 30 days supply | Qty: 60 | Fill #0

## 2016-12-23 MED FILL — HYDROCHLOROTHIAZIDE 25 MG T: 25 | 30 days supply | Qty: 30 | Fill #0

## 2016-12-23 NOTE — Progress Notes (Signed)
Subjective:  Patient ID: Amanda [name], female    DOB: [birth year]  Age: 51 y.o. MRN: [id number]  CC: Follow-up (2 mth)   HPI Amanda [name] is a 51 year old female with a history of hypertension, bipolar disorder, neuropathy, chronic low back pain, PUD/GERD, Migraine, carpal tunnel syndrome Who comes in today for follow-up of her migraine.  At her last office visit Topamax had been increased to 200 mg twice daily and she reports decrease in intensity of the headaches but still has intermittent headaches which are mild to moderate but she is uncertain of the frequency. She has not been taking 200 mg twice daily but was has been taking 100 mg twice daily. She also uses Imitrex for breakthrough headaches. She has occasional nausea and photophobia with the headaches but no vomiting.   Her blood pressure is elevated despite compliance with her antihypertensives. Of note she has gained 45 pounds in the last 1 year. She does not exercise regularly. Of note she remains on Remeron which she receives from mental health for treatment of depression.  Request a letter to allow her to keep her dog for a companion and to provide her with a one level apartment.  Past Medical History:  Diagnosis Date  . Anxiety   . Arthritis    lower back  . Asthma   . Bipolar disorder (HCC)   . Carpal tunnel syndrome, bilateral   . Chronic back pain   . Depression   . Diverticulosis   . Fx. left wrist   . GERD (gastroesophageal reflux disease)   . Heart murmur    "born with"  . Hypertension   . IBS (irritable bowel syndrome)   . Internal hemorrhoids   . Muscle spasms of neck   . Neuropathy   . PUD (peptic ulcer disease)     Past Surgical History:  Procedure Laterality Date  . ARTHRODESIS METATARSALPHALANGEAL JOINT (MTPJ) Right 04/29/2016   Procedure: RIGHT GREAT TOE METATARSOPHALANGEAL JOINT (MTPJ) FUSION, HARDWARE REMOVAL;  Surgeon: Tarry Kos, MD;  Location: [facility];  Service:  Orthopedics;  Laterality: Right;  RIGHT GREAT TOE METATARSOPHALANGEAL JOINT (MTPJ) FUSION, HARDWARE REMOVAL  . CARPAL TUNNEL RELEASE Right 2003  . CHOLECYSTECTOMY N/A 02/09/2014   Procedure: LAPAROSCOPIC CHOLECYSTECTOMY WITH INTRAOPERATIVE CHOLANGIOGRAM;  Surgeon: Valarie Merino, MD;  Location: [facility];  Service: General;  Laterality: N/A;  . HAMMER TOE FUSION Bilateral 2008  . HARDWARE REMOVAL Right 04/29/2016   Procedure: HARDWARE REMOVAL;  Surgeon: Tarry Kos, MD;  Location: [facility];  Service: Orthopedics;  Laterality: Right;  HARDWARE REMOVAL  . ROTATOR CUFF REPAIR Right 2011  . WRIST FRACTURE SURGERY Left      Outpatient Medications Prior to Visit  Medication Sig Dispense Refill  . albuterol (PROAIR HFA) 108 (90 Base) MCG/ACT inhaler Inhale 2 puffs into the lungs every 6 (six) hours as needed for wheezing or shortness of breath. 1 Inhaler 1  . atorvastatin (LIPITOR) 20 MG tablet Take 1 tablet (20 mg total) by mouth daily. 30 tablet 3  . cetirizine (ZYRTEC) 10 MG tablet Take 1 tablet (10 mg total) by mouth daily. 30 tablet 2  . colestipol (COLESTID) 1 g tablet Take 1 tablet (1 g total) by mouth every morning. 30 tablet 4  . cyclobenzaprine (FLEXERIL) 10 MG tablet Take 1 tablet (10 mg total) by mouth 2 (two) times daily. 60 tablet 2  . divalproex (DEPAKOTE) 500 MG DR tablet Take 500 mg by  mouth 3 (three) times daily.    . haloperidol (HALDOL) 5 MG tablet Take 1 tablet by mouth daily.  0  . hydrOXYzine (ATARAX/VISTARIL) 50 MG tablet Take 1 tablet (50 mg total) by mouth 3 (three) times daily as needed for anxiety or itching. 90 tablet 2  . mirtazapine (REMERON) 30 MG tablet Take 45 mg by mouth at bedtime.    Marland Kitchen olopatadine (PATANOL) 0.1 % ophthalmic solution PLACE 1 DROP INTO BOTH EYES 2 TIMES DAILY. 5 mL 0  . gabapentin (NEURONTIN) 800 MG tablet Take 1 tablet (800 mg total) by mouth 2 (two) times daily. 60 tablet 5  . hydrochlorothiazide (MICROZIDE) 12.5 MG capsule TAKE 1  CAPSULE BY MOUTH DAILY AS NEEDED FOR PEDAL EDEMA 30 capsule 5  . metoprolol (LOPRESSOR) 50 MG tablet Take 1 tablet (50 mg total) by mouth 2 (two) times daily. 60 tablet 3  . dexlansoprazole (DEXILANT) 60 MG capsule Take 1 capsule (60 mg total) by mouth daily. (Patient not taking: Reported on 12/23/2016) 30 capsule 3  . pantoprazole (PROTONIX) 40 MG tablet Take 1 tablet (40 mg total) by mouth 2 (two) times daily before a meal. 60 tablet 3  . pregabalin (LYRICA) 75 MG capsule Take 1 capsule (75 mg total) by mouth 2 (two) times daily. 180 capsule 3  . ranitidine (ZANTAC) 150 MG capsule Take 1 capsule (150 mg total) by mouth every evening. 30 capsule 3  . SUMAtriptan (IMITREX) 50 MG tablet Take 50 mg orally at the onset of a migraine. May repeat in 2 hours if headache persists or recurs. Maximum daily dose 200 mg 10 tablet 2  . topiramate (TOPAMAX) 100 MG tablet Take 2 tablets (200 mg total) by mouth 2 (two) times daily. 120 tablet 3  . traZODone (DESYREL) 300 MG tablet Take 1 tablet (300 mg total) by mouth at bedtime. 30 tablet 0  . VENTOLIN HFA 108 (90 Base) MCG/ACT inhaler INHALE 1-2 PUFFS INTO THE LUNGS EVERY 6 HOURS AS NEEDED FOR WHEEZING OR SHORTNESS OF BREATH. 18 g 2   Facility-Administered Medications Prior to Visit  Medication Dose Route Frequency Provider Last Rate Last Dose  . lidocaine (PF) (XYLOCAINE) 1 % injection 0.3 mL  0.3 mL Other Once Tyrell Antonio, MD        ROS Review of Systems Constitutional: Negative for activity change, appetite change and fatigue.  HENT: Negative for congestion, sinus pressure and sore throat.   Eyes: Negative for visual disturbance.  Respiratory: Negative for cough, chest tightness, shortness of breath and wheezing.   Cardiovascular:. Negative for chest pain.  Gastrointestinal: Negative for abdominal distention, abdominal pain and constipation.  Endocrine: Negative for polydipsia.  Genitourinary: Negative for dysuria and frequency.  Musculoskeletal:  Positive for back pain. Negative for arthralgias.  Skin: Negative for rash.  Neurological: Positive for numbness and headaches. Negative for tremors and light-headedness.  Hematological: Does not bruise/bleed easily.  Psychiatric/Behavioral: Negative for agitation and behavioral problems  Objective:  BP (!) 163/108 (BP Location: Right Arm, Patient Position: Sitting, Cuff Size: Large) Comment: Pt stated she took BP meds around 12 noon  Pulse 85   Temp 97.5 F (36.4 C) (Oral)   Resp 16   Ht  (1.702 m)   Wt 233 lb (105.7 kg) Comment: w/shoes  LMP 11/02/2016 (Approximate)   SpO2 100%   BMI 36.49 kg/m   BP/Weight 12/23/2016 10/23/2016 09/24/2016  Systolic BP 163 127 126  Diastolic BP 108 83 84  Wt. (Lbs) 233 228 231.6  BMI  36.49 35.71 36.27  Some encounter information is confidential and restricted. Go to Review Flowsheets activity to see all data.      Physical Exam Constitutional: She is obese, oriented to person, place, and time. She appears well-developed and well-nourished.  Cardiovascular: Normal rate, normal heart sounds and intact distal pulses.   No murmur heard. Pulmonary/Chest: Effort normal and breath sounds normal. She has no wheezes. She has no rales. She exhibits no tenderness.  Abdominal: Soft. Bowel sounds are normal. She exhibits no distension and no mass. There is no tenderness.  Musculoskeletal: Normal range of motion.  Neurological: She is alert and oriented to person, place, and time.  Skin: Skin is warm and dry.  Psychiatric: She has a normal mood and affect  Assessment & Plan:   1. Essential hypertension Uncontrolled This could be attributed to weight gain of 45 pounds in the last 1 year Increased dose of hydrochlorothiazide and metoprolol - metoprolol (LOPRESSOR) 100 MG tablet; Take 1 tablet (100 mg total) by mouth 2 (two) times daily.  Dispense: 60 tablet; Refill: 3  2. Chronic migraine without aura without status migrainosus, not  intractable Uncontrolled as she has been taking less and the prescribed dose of Topamax Advised to take 200 mg of Topamax  3. Pure hypercholesterolemia Continue statin  4. Weight gain She has gained 45 pounds in the last 1 year Could be secondary to use of Remeron Provided a note to psychiatrist indicating concern of recent weight gain and possible change in therapy.  Provided the patient with requested letter.  Meds ordered this encounter  Medications  . metoprolol (LOPRESSOR) 100 MG tablet    Sig: Take 1 tablet (100 mg total) by mouth 2 (two) times daily.    Dispense:  60 tablet    Refill:  3    Discontinue previous dose  . hydrochlorothiazide (HYDRODIURIL) 25 MG tablet    Sig: Take 1 tablet (25 mg total) by mouth daily.    Dispense:  30 tablet    Refill:  3    Discontinue previous dose    Follow-up: Return in about 1 month (around 01/23/2017) for Follow-up on migraine.   Amanda Shaggy MD

## 2016-12-25 ENCOUNTER — Encounter: Payer: Self-pay | Admitting: Internal Medicine

## 2016-12-25 ENCOUNTER — Telehealth: Payer: Self-pay | Admitting: Internal Medicine

## 2016-12-25 NOTE — Telephone Encounter (Signed)
Error

## 2016-12-25 NOTE — Telephone Encounter (Signed)
Patient uses [facility] (she is on 100% discount with [facility], no insurance). She requests that I call pharmacy to see what they will provide after I requested that she contact them. I have found that they have on hand pepcid, nexium, aciphex, prevacid. They can get Dexilant through patient assistance but that may take some time. Patient has already tried and failed omeprazole and twice daily pantoprazole. Please advise.

## 2016-12-26 NOTE — Telephone Encounter (Signed)
Would ask her to apply for Dexilant patient assistance Until then try lansoprazole 30 mg BID-AC

## 2016-12-29 MED ORDER — LANSOPRAZOLE 30 MG PO CPDR: DELAYED_RELEASE_CAPSULE | ORAL | 0 refills | Status: DC

## 2016-12-29 MED FILL — ?LANSOPRAZOLE DR 30MG CAPSU: 30 | 30 days supply | Qty: 60 | Fill #0

## 2016-12-29 NOTE — Telephone Encounter (Signed)
I have spoken to Amanda [name] and have given her Dr Lauro FranklinPyrtle's recommendations. She verbalizes understanding that she will need to take Lansoprazole 30 mg twice daily before meals until she can get Dexilant patient assistance which she needs to get through her pharmacy.

## 2016-12-31 ENCOUNTER — Other Ambulatory Visit: Payer: Self-pay | Admitting: Family Medicine

## 2016-12-31 MED FILL — ?CETIRIZINE HCL 10 MG TABLE: 10 | 30 days supply | Qty: 30 | Fill #0

## 2017-01-01 ENCOUNTER — Ambulatory Visit: Payer: Self-pay | Attending: Family Medicine

## 2017-01-12 ENCOUNTER — Other Ambulatory Visit: Payer: Self-pay | Admitting: Family Medicine

## 2017-01-12 ENCOUNTER — Other Ambulatory Visit: Payer: Self-pay | Admitting: Internal Medicine

## 2017-01-12 DIAGNOSIS — G43709 Chronic migraine without aura, not intractable, without status migrainosus: Secondary | ICD-10-CM

## 2017-01-12 MED FILL — traZODone HCL 150 MG TABS: 150 | 30 days supply | Qty: 60 | Fill #0

## 2017-01-12 MED FILL — GABAPENTIN 800 MG TABLET: 800 | 22 days supply | Qty: 90 | Fill #0

## 2017-01-12 MED FILL — ?HALOPERIDOL 5 MG TABLET: 5 | 30 days supply | Qty: 30 | Fill #0

## 2017-01-12 MED FILL — SUMATRIPTAN SUCC 50 MG TAB: 50 | 30 days supply | Qty: 9 | Fill #2

## 2017-01-12 MED FILL — !VENTOLIN HFA INHALER: 108 (90 BAS | 25 days supply | Qty: 18 | Fill #1

## 2017-01-12 MED FILL — ?CYCLOBENZAPRINE 10 MG TABL: 10 | 30 days supply | Qty: 60 | Fill #2

## 2017-01-12 MED FILL — TOPIRAMATE 100 MG TABLET: 100 | 30 days supply | Qty: 120 | Fill #3

## 2017-01-12 MED FILL — HYDROXYZINE PAM 50 MG CAP: 50 | 30 days supply | Qty: 120 | Fill #5

## 2017-01-12 MED FILL — ?MIRTAZAPINE 30 MG TABLET: 30 | 30 days supply | Qty: 30 | Fill #2

## 2017-01-12 MED FILL — DIVALPROEX SOD DR 500 MG TA: 500 | 30 days supply | Qty: 90 | Fill #0

## 2017-01-12 MED FILL — PANTOPRAZOLE SOD DR 40 MG T: 40 | 30 days supply | Qty: 60 | Fill #3

## 2017-01-21 ENCOUNTER — Ambulatory Visit (INDEPENDENT_AMBULATORY_CARE_PROVIDER_SITE_OTHER): Payer: Self-pay | Admitting: Orthopaedic Surgery

## 2017-01-25 ENCOUNTER — Other Ambulatory Visit: Payer: Self-pay | Admitting: Family Medicine

## 2017-01-25 ENCOUNTER — Encounter: Payer: Self-pay | Admitting: Family Medicine

## 2017-01-25 ENCOUNTER — Ambulatory Visit: Payer: Self-pay | Attending: Family Medicine | Admitting: Family Medicine

## 2017-01-25 VITALS — BP 127/85 | HR 84 | Temp 98.0°F | Resp 18 | Ht 67.0 in | Wt 234.0 lb

## 2017-01-25 DIAGNOSIS — Z9889 Other specified postprocedural states: Secondary | ICD-10-CM | POA: Insufficient documentation

## 2017-01-25 DIAGNOSIS — Z79899 Other long term (current) drug therapy: Secondary | ICD-10-CM | POA: Insufficient documentation

## 2017-01-25 DIAGNOSIS — K648 Other hemorrhoids: Secondary | ICD-10-CM | POA: Insufficient documentation

## 2017-01-25 DIAGNOSIS — Z8711 Personal history of peptic ulcer disease: Secondary | ICD-10-CM | POA: Insufficient documentation

## 2017-01-25 DIAGNOSIS — M25561 Pain in right knee: Secondary | ICD-10-CM | POA: Insufficient documentation

## 2017-01-25 DIAGNOSIS — Z9049 Acquired absence of other specified parts of digestive tract: Secondary | ICD-10-CM | POA: Insufficient documentation

## 2017-01-25 DIAGNOSIS — F319 Bipolar disorder, unspecified: Secondary | ICD-10-CM | POA: Insufficient documentation

## 2017-01-25 DIAGNOSIS — M545 Low back pain: Secondary | ICD-10-CM | POA: Insufficient documentation

## 2017-01-25 DIAGNOSIS — G8929 Other chronic pain: Secondary | ICD-10-CM | POA: Insufficient documentation

## 2017-01-25 DIAGNOSIS — J302 Other seasonal allergic rhinitis: Secondary | ICD-10-CM

## 2017-01-25 DIAGNOSIS — Z886 Allergy status to analgesic agent status: Secondary | ICD-10-CM | POA: Insufficient documentation

## 2017-01-25 DIAGNOSIS — K219 Gastro-esophageal reflux disease without esophagitis: Secondary | ICD-10-CM | POA: Insufficient documentation

## 2017-01-25 DIAGNOSIS — F419 Anxiety disorder, unspecified: Secondary | ICD-10-CM | POA: Insufficient documentation

## 2017-01-25 DIAGNOSIS — G5603 Carpal tunnel syndrome, bilateral upper limbs: Secondary | ICD-10-CM | POA: Insufficient documentation

## 2017-01-25 DIAGNOSIS — G43709 Chronic migraine without aura, not intractable, without status migrainosus: Secondary | ICD-10-CM | POA: Insufficient documentation

## 2017-01-25 DIAGNOSIS — Z880 Allergy status to penicillin: Secondary | ICD-10-CM | POA: Insufficient documentation

## 2017-01-25 DIAGNOSIS — J45909 Unspecified asthma, uncomplicated: Secondary | ICD-10-CM | POA: Insufficient documentation

## 2017-01-25 DIAGNOSIS — K589 Irritable bowel syndrome without diarrhea: Secondary | ICD-10-CM | POA: Insufficient documentation

## 2017-01-25 DIAGNOSIS — I10 Essential (primary) hypertension: Secondary | ICD-10-CM | POA: Insufficient documentation

## 2017-01-25 MED ORDER — MELOXICAM 7.5 MG PO TABS: 7.5000 mg | ORAL_TABLET | Freq: Every day | ORAL | 2 refills | Status: DC

## 2017-01-25 MED FILL — ?ATORVASTATIN 20 MG TABLET: 20 | 30 days supply | Qty: 30 | Fill #1

## 2017-01-25 MED FILL — ?METOPROLOL 100 MG TABLET: 100 | 30 days supply | Qty: 60 | Fill #1

## 2017-01-25 MED FILL — HYDROCHLOROTHIAZIDE 25 MG T: 25 | 30 days supply | Qty: 30 | Fill #1

## 2017-01-25 MED FILL — MELOXICAM 7.5 MG TABLET: 7.5 | 30 days supply | Qty: 30 | Fill #0

## 2017-01-25 MED FILL — raNITIdine HCL 150 MG TABS: 150 | 30 days supply | Qty: 30 | Fill #1

## 2017-01-25 NOTE — Progress Notes (Signed)
Patient is here for FU  Patient complains of right knee joint pain being present. Patient scales pain at an 8 currently.  Patient has taken medication today. Patient has not eaten today.

## 2017-01-25 NOTE — Progress Notes (Signed)
Subjective:  Patient ID: Amanda [name], female    DOB: 1966-07-11  Age: 51 y.o. MRN: [id number]  CC: Follow-up   HPI Amanda [name] s a 51 year old female with a history of hypertension, bipolar disorder, neuropathy, chronic low back pain, PUD/GERD, Migraine, carpal tunnel syndrome Who comes in today for follow-up of her migraine and reports control of migraines after Topamax had been increased to 200 mg twice daily at her last visit and she uses Imitrex for breakthrough headaches.  She complains of pain on the medial aspect of her right knee rated as an 8/10 for the last 2 weeks; pain is worse with weightbearing. She denies swelling of her knee.  Of note she had gained 45 pounds in the last 1 year which was attributed to her Remeron and had sent a note to her psychiatrist regarding this; Remeron has been discontinued since then.  Past Medical History:  Diagnosis Date  . Anxiety   . Arthritis    lower back  . Asthma   . Bipolar disorder (HCC)   . Carpal tunnel syndrome, bilateral   . Chronic back pain   . Depression   . Diverticulosis   . Fx. left wrist   . GERD (gastroesophageal reflux disease)   . Heart murmur    "born with"  . Hypertension   . IBS (irritable bowel syndrome)   . Internal hemorrhoids   . Muscle spasms of neck   . Neuropathy   . PUD (peptic ulcer disease)     Past Surgical History:  Procedure Laterality Date  . ARTHRODESIS METATARSALPHALANGEAL JOINT (MTPJ) Right 04/29/2016   Procedure: RIGHT GREAT TOE METATARSOPHALANGEAL JOINT (MTPJ) FUSION, HARDWARE REMOVAL;  Surgeon: Tarry Kos, MD;  Location: [facility];  Service: Orthopedics;  Laterality: Right;  RIGHT GREAT TOE METATARSOPHALANGEAL JOINT (MTPJ) FUSION, HARDWARE REMOVAL  . CARPAL TUNNEL RELEASE Right 2003  . CHOLECYSTECTOMY N/A 02/09/2014   Procedure: LAPAROSCOPIC CHOLECYSTECTOMY WITH INTRAOPERATIVE CHOLANGIOGRAM;  Surgeon: Valarie Merino, MD;  Location: [facility];  Service: General;   Laterality: N/A;  . HAMMER TOE FUSION Bilateral 2008  . HARDWARE REMOVAL Right 04/29/2016   Procedure: HARDWARE REMOVAL;  Surgeon: Tarry Kos, MD;  Location: [facility];  Service: Orthopedics;  Laterality: Right;  HARDWARE REMOVAL  . ROTATOR CUFF REPAIR Right 2011  . WRIST FRACTURE SURGERY Left     Allergies  Allergen Reactions  . Penicillins Anaphylaxis    Has patient had a PCN reaction causing immediate rash, facial/tongue/throat swelling, SOB or lightheadedness with hypotension: Yes Has patient had a PCN reaction causing severe rash involving mucus membranes or skin necrosis: Yes Has patient had a PCN reaction that required hospitalization Yes Has patient had a PCN reaction occurring within the last 10 years: No-more than 10 years ago If all of the above answers are "NO", then may proceed with Cephalosporin use.   Jonne Ply [Aspirin] Rash     Outpatient Medications Prior to Visit  Medication Sig Dispense Refill  . albuterol (PROAIR HFA) 108 (90 Base) MCG/ACT inhaler Inhale 2 puffs into the lungs every 6 (six) hours as needed for wheezing or shortness of breath. 1 Inhaler 1  . atorvastatin (LIPITOR) 20 MG tablet Take 1 tablet (20 mg total) by mouth daily. 30 tablet 3  . cetirizine (ZYRTEC) 10 MG tablet TAKE 1 TABLET BY MOUTH DAILY. 30 tablet 2  . colestipol (COLESTID) 1 g tablet Take 1 tablet (1 g total) by mouth every morning.  30 tablet 4  . cyclobenzaprine (FLEXERIL) 10 MG tablet Take 1 tablet (10 mg total) by mouth 2 (two) times daily. 60 tablet 2  . divalproex (DEPAKOTE) 500 MG DR tablet Take 500 mg by mouth 3 (three) times daily.    . haloperidol (HALDOL) 5 MG tablet Take 1 tablet by mouth daily.  0  . hydrochlorothiazide (HYDRODIURIL) 25 MG tablet Take 1 tablet (25 mg total) by mouth daily. 30 tablet 3  . hydrOXYzine (ATARAX/VISTARIL) 50 MG tablet Take 1 tablet (50 mg total) by mouth 3 (three) times daily as needed for anxiety or itching. 90 tablet 2  . lansoprazole  (PREVACID) 30 MG capsule Take 1 cap po bid ac u ntil patient assistance for Dexilant has been completed (pharmacy please send for dexilant as previously rx'ed-thx) 60 capsule 0  . metoprolol (LOPRESSOR) 100 MG tablet Take 1 tablet (100 mg total) by mouth 2 (two) times daily. 60 tablet 3  . olopatadine (PATANOL) 0.1 % ophthalmic solution PLACE 1 DROP INTO BOTH EYES 2 TIMES DAILY. 5 mL 0  . pantoprazole (PROTONIX) 40 MG tablet Take 1 tablet (40 mg total) by mouth 2 (two) times daily before a meal. 60 tablet 3  . pregabalin (LYRICA) 75 MG capsule Take 1 capsule (75 mg total) by mouth 2 (two) times daily. 180 capsule 3  . ranitidine (ZANTAC) 150 MG capsule Take 1 capsule (150 mg total) by mouth every evening. 30 capsule 3  . SUMAtriptan (IMITREX) 50 MG tablet Take 50 mg orally at the onset of a migraine. May repeat in 2 hours if headache persists or recurs. Maximum daily dose 200 mg 10 tablet 2  . topiramate (TOPAMAX) 100 MG tablet Take 2 tablets (200 mg total) by mouth 2 (two) times daily. 120 tablet 3  . traZODone (DESYREL) 300 MG tablet Take 1 tablet (300 mg total) by mouth at bedtime. 30 tablet 0  . VENTOLIN HFA 108 (90 Base) MCG/ACT inhaler INHALE 1-2 PUFFS INTO THE LUNGS EVERY 6 HOURS AS NEEDED FOR WHEEZING OR SHORTNESS OF BREATH. 18 g 2  . dexlansoprazole (DEXILANT) 60 MG capsule Take 1 capsule (60 mg total) by mouth daily. (Patient not taking: Reported on 12/23/2016) 30 capsule 3  . mirtazapine (REMERON) 30 MG tablet Take 45 mg by mouth at bedtime.     Facility-Administered Medications Prior to Visit  Medication Dose Route Frequency Provider Last Rate Last Dose  . lidocaine (PF) (XYLOCAINE) 1 % injection 0.3 mL  0.3 mL Other Once Tyrell Antonio, MD        ROS Review of Systems  Constitutional: Negative for activity change, appetite change and fatigue.  HENT: Negative for congestion, sinus pressure and sore throat.   Eyes: Negative for visual disturbance.  Respiratory: Negative for cough,  chest tightness, shortness of breath and wheezing.   Cardiovascular: Negative for chest pain and palpitations.  Gastrointestinal: Negative for abdominal distention, abdominal pain and constipation.  Endocrine: Negative for polydipsia.  Genitourinary: Negative for dysuria and frequency.  Musculoskeletal:       See hpi  Skin: Negative for rash.  Neurological: Negative for tremors, light-headedness and numbness.  Hematological: Does not bruise/bleed easily.  Psychiatric/Behavioral: Negative for agitation and behavioral problems.    Objective:  BP 127/85 (BP Location: Right Arm, Patient Position: Sitting, Cuff Size: Large)   Pulse 84   Temp 98 F (36.7 C) (Oral)   Resp 18   Ht 5\' 7"  (1.702 m)   Wt 234 lb (106.1 kg)   SpO2  100%   BMI 36.65 kg/m   BP/Weight 01/25/2017 12/23/2016 10/23/2016  Systolic BP 127 163 127  Diastolic BP 85 108 83  Wt. (Lbs) 234 233 228  BMI 36.65 36.49 35.71  Some encounter information is confidential and restricted. Go to Review Flowsheets activity to see all data.      Physical Exam  Constitutional: She is oriented to person, place, and time. She appears well-developed and well-nourished.  Cardiovascular: Normal rate, normal heart sounds and intact distal pulses.   No murmur heard. Pulmonary/Chest: Effort normal and breath sounds normal. She has no wheezes. She has no rales. She exhibits no tenderness.  Abdominal: Soft. Bowel sounds are normal. She exhibits no distension and no mass. There is no tenderness.  Musculoskeletal: Normal range of motion. She exhibits tenderness (tenderness on deep palpation of medial aspect of right knee). She exhibits no edema or deformity.  Crepitus on range of motion of the right knee  Neurological: She is alert and oriented to person, place, and time.  Skin: Skin is warm and dry.  Psychiatric: She has a normal mood and affect.     Assessment & Plan:   1. Chronic migraine without aura without status migrainosus, not  intractable Controlled Continue Topamax and Imitrex  2. Acute pain of right knee Could be secondary osteoarthritis and coupled with her recent weight gain - meloxicam (MOBIC) 7.5 MG tablet; Take 1 tablet (7.5 mg total) by mouth daily.  Dispense: 30 tablet; Refill: 2  3. Morbid obesity (HCC) Advised to work on reducing portion sizes, increasing physical activity as tolerated   Meds ordered this encounter  Medications  . meloxicam (MOBIC) 7.5 MG tablet    Sig: Take 1 tablet (7.5 mg total) by mouth daily.    Dispense:  30 tablet    Refill:  2    Follow-up: Return in about 3 months (around 04/27/2017) for follow up of chronic medical conditions.    This note has been created with Education officer, environmentalDragon speech recognition software and smart phrase technology. Any transcriptional errors are unintentional.     Jaclyn ShaggyEnobong Amao MD

## 2017-01-25 NOTE — Patient Instructions (Signed)

## 2017-01-26 MED FILL — OLOPATADINE HCL 0.1% EYE DR: 0.1 | 30 days supply | Qty: 5 | Fill #0

## 2017-01-28 ENCOUNTER — Ambulatory Visit (INDEPENDENT_AMBULATORY_CARE_PROVIDER_SITE_OTHER): Payer: Self-pay

## 2017-01-28 ENCOUNTER — Ambulatory Visit (INDEPENDENT_AMBULATORY_CARE_PROVIDER_SITE_OTHER): Payer: Self-pay | Admitting: Orthopaedic Surgery

## 2017-01-28 ENCOUNTER — Encounter (INDEPENDENT_AMBULATORY_CARE_PROVIDER_SITE_OTHER): Payer: Self-pay | Admitting: Orthopaedic Surgery

## 2017-01-28 DIAGNOSIS — M79672 Pain in left foot: Secondary | ICD-10-CM

## 2017-01-28 DIAGNOSIS — M79671 Pain in right foot: Secondary | ICD-10-CM

## 2017-01-28 MED FILL — ?CETIRIZINE HCL 10 MG TABLE: 10 | 30 days supply | Qty: 30 | Fill #1

## 2017-01-28 NOTE — Progress Notes (Signed)
Office Visit Note   Patient: Amanda [name]           Date of Birth: 17-Sep-1965           MRN: [id number] Visit Date: 01/28/2017              Requested by: Jaclyn Shaggy, MD [address] [place], [place] [address] PCP: Jaclyn Shaggy, MD   Assessment & Plan: Visit Diagnoses:  1. Bilateral foot pain     Plan: Overall impression is right bunionette deformity. Recommend wider shoes. This does not seem to be bothering her too much. She also has a left second toe MTP joint synovitis. I recommend scheduled NSAIDs and relative rest. If not better and consider injection. Follow up with me as needed.  Follow-Up Instructions: Return if symptoms worsen or fail to improve.   Orders:  Orders Placed This Encounter  Procedures  . XR Foot Complete Left  . XR Foot Complete Right   No orders of the defined types were placed in this encounter.     Procedures: No procedures performed   Clinical Data: No additional findings.   Subjective: Chief Complaint  Patient presents with  . Right Foot - Pain  . Left Foot - Pain    Patient is a 51 year old female comes in with bilateral foot pain. She states that she has pain with her right small toe over the MTP joint. She's not complaining of her great toe. She is also complaining of left second toe MTP joint pain. This is a full with weightbearing and hurts occasionally. She denies any constitutional symptoms.    Review of Systems  Constitutional: Negative.   HENT: Negative.   Eyes: Negative.   Respiratory: Negative.   Cardiovascular: Negative.   Endocrine: Negative.   Musculoskeletal: Negative.   Neurological: Negative.   Hematological: Negative.   Psychiatric/Behavioral: Negative.   All other systems reviewed and are negative.    Objective: Vital Signs: There were no vitals taken for this visit.  Physical Exam  Constitutional: She is oriented to person, place, and time. She appears well-developed and well-nourished.    Pulmonary/Chest: Effort normal.  Neurological: She is alert and oriented to person, place, and time.  Skin: Skin is warm. Capillary refill takes less than 2 seconds.  Psychiatric: She has a normal mood and affect. Her behavior is normal. Judgment and thought content normal.  Nursing note and vitals reviewed.   Ortho Exam Exam of bilateral feet show well-healed surgical scars. She has a small bunionette deformity of her right foot. Her great toe on the right does not show any issues. Her left foot is tender at the second MTP joint. She has pain with range of motion. Specialty Comments:  MRI on 11/27/2015 left wrist: Severe de Quervain's tenosynovitis. Ulnar minus .Marland Kitchen Small enchondroma of the proximal pole of the scaphoid.   S/p US guided injection of Wrist on: 12/16/15   Neuro Consult: Small fiber polyneuropathy.  Imaging: Xr Foot Complete Left  Result Date: 01/28/2017 No acute or structural abnormalities.  Xr Foot Complete Right  Result Date: 01/28/2017 Stable fixation and fusion of great toe    PMFS History: Patient Active Problem List   Diagnosis Date Noted  . Morbid obesity (HCC) 12/24/2016  . Hyperlipidemia 12/23/2016  . Painful orthopaedic hardware (HCC) 03/24/2016  . Corns and callosities 03/24/2016  . PUD (peptic ulcer disease) 01/02/2016  . Onychomycosis 01/02/2016  . Seasonal allergies 11/19/2015  . Wrist pain, left 10/23/2015  . GERD (  gastroesophageal reflux disease) 08/06/2015  . Asthma 08/06/2015  . Back pain 08/06/2015  . Migraine 08/06/2015  . Bipolar disorder (HCC) 07/25/2015  . Neuropathy 07/08/2015  . Major depressive disorder, recurrent, severe without psychotic features (HCC)   . Alcohol use disorder, severe, dependence (HCC) 11/16/2014  . Cocaine use disorder, severe, dependence (HCC) 11/16/2014  . Acute calculous cholecystitis s/p lap chole 02/09/2014 02/09/2014  . HTN (hypertension) 07/25/2013  . Pain in vertebral column 05/05/2013  . Carpal tunnel  syndrome 11/18/2012  . Numbness and tingling of both legs below knees 11/18/2012  . Lumbago 11/18/2012  . Anxiety state, unspecified 11/18/2012   Past Medical History:  Diagnosis Date  . Anxiety   . Arthritis    lower back  . Asthma   . Bipolar disorder (HCC)   . Carpal tunnel syndrome, bilateral   . Chronic back pain   . Depression   . Diverticulosis   . Fx. left wrist   . GERD (gastroesophageal reflux disease)   . Heart murmur    "born with"  . Hypertension   . IBS (irritable bowel syndrome)   . Internal hemorrhoids   . Muscle spasms of neck   . Neuropathy   . PUD (peptic ulcer disease)     Family History  Problem Relation Age of Onset  . Pancreatic cancer Mother   . Heart failure Father   . Diabetes Father   . Breast cancer Paternal Grandmother   . HIV/AIDS Brother   . Other Sister        Brain tumor  . Colon cancer Neg Hx     Past Surgical History:  Procedure Laterality Date  . ARTHRODESIS METATARSALPHALANGEAL JOINT (MTPJ) Right 04/29/2016   Procedure: RIGHT GREAT TOE METATARSOPHALANGEAL JOINT (MTPJ) FUSION, HARDWARE REMOVAL;  Surgeon: Tarry KosNaiping M Yenni Carra, MD;  Location: [facility];  Service: Orthopedics;  Laterality: Right;  RIGHT GREAT TOE METATARSOPHALANGEAL JOINT (MTPJ) FUSION, HARDWARE REMOVAL  . CARPAL TUNNEL RELEASE Right 2003  . CHOLECYSTECTOMY N/A 02/09/2014   Procedure: LAPAROSCOPIC CHOLECYSTECTOMY WITH INTRAOPERATIVE CHOLANGIOGRAM;  Surgeon: Valarie MerinoMatthew B Martin, MD;  Location: [facility];  Service: General;  Laterality: N/A;  . HAMMER TOE FUSION Bilateral 2008  . HARDWARE REMOVAL Right 04/29/2016   Procedure: HARDWARE REMOVAL;  Surgeon: Tarry KosNaiping M Deissy Guilbert, MD;  Location: [facility];  Service: Orthopedics;  Laterality: Right;  HARDWARE REMOVAL  . ROTATOR CUFF REPAIR Right 2011  . WRIST FRACTURE SURGERY Left    Social History   Occupational History  . [employer]   Social History Main Topics  . Smoking status: Light Tobacco Smoker  .  Smokeless tobacco: Never Used     Comment: "takes a puff sometimes"  . Alcohol use No  . Drug use: No     Comment: last used two years ago  . Sexual activity: No

## 2017-02-05 ENCOUNTER — Other Ambulatory Visit: Payer: Self-pay | Admitting: Family Medicine

## 2017-02-05 DIAGNOSIS — G43709 Chronic migraine without aura, not intractable, without status migrainosus: Secondary | ICD-10-CM

## 2017-02-05 MED FILL — traZODone HCL 150 MG TABS: 150 | 30 days supply | Qty: 60 | Fill #1

## 2017-02-05 MED FILL — HYDROXYZINE PAM 50 MG CAP: 50 | 30 days supply | Qty: 120 | Fill #6

## 2017-02-05 MED FILL — DIVALPROEX SOD DR 500 MG TA: 500 | 30 days supply | Qty: 90 | Fill #1

## 2017-02-05 MED FILL — ?HALOPERIDOL 5 MG TABLET: 5 | 30 days supply | Qty: 30 | Fill #1

## 2017-02-05 MED FILL — SUMATRIPTAN SUCC 50 MG TAB: 50 | 10 days supply | Qty: 3 | Fill #3

## 2017-02-08 MED FILL — TOPIRAMATE 100 MG TABLET: 100 | 30 days supply | Qty: 120 | Fill #0

## 2017-02-08 MED FILL — CYCLOBENZAPRINE 10 MG TAB: 10 | 30 days supply | Qty: 60 | Fill #0

## 2017-02-10 ENCOUNTER — Other Ambulatory Visit: Payer: Self-pay | Admitting: Family Medicine

## 2017-02-10 MED FILL — GABAPENTIN 800 MG TABLET: 800 | 22 days supply | Qty: 90 | Fill #1

## 2017-02-10 MED FILL — !VENTOLIN HFA INHALER: 108 (90 BAS | 25 days supply | Qty: 18 | Fill #2

## 2017-02-22 ENCOUNTER — Other Ambulatory Visit: Payer: Self-pay | Admitting: *Deleted

## 2017-02-22 MED ORDER — DEXLANSOPRAZOLE 60 MG PO CPDR: 60.0000 mg | DELAYED_RELEASE_CAPSULE | Freq: Every day | ORAL | 3 refills | Status: DC

## 2017-02-22 MED ORDER — ALBUTEROL SULFATE HFA 108 (90 BASE) MCG/ACT IN AERS: 2.0000 | INHALATION_SPRAY | Freq: Four times a day (QID) | RESPIRATORY_TRACT | 3 refills | Status: DC | PRN

## 2017-02-22 MED FILL — MELOXICAM 7.5 MG TABLET: 7.5 | 30 days supply | Qty: 30 | Fill #1

## 2017-02-22 MED FILL — raNITIdine HCL 150 MG TABS: 150 | 30 days supply | Qty: 30 | Fill #2

## 2017-02-22 MED FILL — ?ATORVASTATIN 20 MG TABLET: 20 | 30 days supply | Qty: 30 | Fill #2

## 2017-02-22 MED FILL — ?METOPROLOL 100 MG TABLET: 100 | 30 days supply | Qty: 60 | Fill #2

## 2017-02-22 NOTE — Telephone Encounter (Signed)
PRINTED PASS PROGRAM 

## 2017-02-22 NOTE — Telephone Encounter (Signed)
PRINTED FOR PASS PROGRAM 

## 2017-02-25 ENCOUNTER — Ambulatory Visit: Payer: Self-pay

## 2017-03-01 ENCOUNTER — Ambulatory Visit: Payer: Self-pay

## 2017-03-03 ENCOUNTER — Ambulatory Visit: Payer: Self-pay | Attending: Family Medicine

## 2017-03-03 MED FILL — ?HALOPERIDOL 5 MG TABLET: 5 | 30 days supply | Qty: 30 | Fill #2

## 2017-03-03 MED FILL — !VENTOLIN HFA INHALER: 108 (90 BAS | 25 days supply | Qty: 18 | Fill #0

## 2017-03-03 MED FILL — GABAPENTIN 800 MG TABLET: 800 | 22 days supply | Qty: 90 | Fill #2

## 2017-03-03 MED FILL — traZODone HCL 150 MG TABS: 150 | 30 days supply | Qty: 60 | Fill #2

## 2017-03-03 MED FILL — DIVALPROEX SOD DR 500 MG TA: 500 | 30 days supply | Qty: 90 | Fill #2

## 2017-03-03 MED FILL — HYDROCHLOROTHIAZIDE 25 MG T: 25 | 30 days supply | Qty: 30 | Fill #2

## 2017-03-03 MED FILL — ?CETIRIZINE HCL 10 MG TABLE: 10 | 30 days supply | Qty: 30 | Fill #2

## 2017-03-03 MED FILL — HYDROXYZINE PAM 50 MG CAP: 50 | 30 days supply | Qty: 120 | Fill #7

## 2017-03-10 ENCOUNTER — Encounter: Payer: Self-pay | Admitting: Pharmacist

## 2017-03-22 MED FILL — GABAPENTIN 800 MG TABLET: 800 | 30 days supply | Qty: 120 | Fill #0

## 2017-03-22 MED FILL — BUPROPION SR 150 MG TABLET: 150 | 30 days supply | Qty: 30 | Fill #0

## 2017-03-24 MED FILL — raNITIdine HCL 150 MG TABS: 150 | 30 days supply | Qty: 30 | Fill #3

## 2017-03-24 MED FILL — MELOXICAM 7.5 MG TABLET: 7.5 | 30 days supply | Qty: 30 | Fill #2

## 2017-03-24 MED FILL — ?ATORVASTATIN 20 MG TABLET: 20 | 30 days supply | Qty: 30 | Fill #3

## 2017-03-24 MED FILL — CYCLOBENZAPRINE 10 MG TAB: 10 | 30 days supply | Qty: 60 | Fill #1

## 2017-03-24 MED FILL — ?METOPROLOL 100 MG TABLET: 100 | 30 days supply | Qty: 60 | Fill #3

## 2017-04-01 MED FILL — TOPIRAMATE 100 MG TABLET: 100 | 30 days supply | Qty: 120 | Fill #1

## 2017-04-01 MED FILL — ?SUMATRIPTAN SUCC 50 MG TAB: 50 | 27 days supply | Qty: 9 | Fill #0

## 2017-04-01 MED FILL — DIVALPROEX SOD DR 500 MG TA: 500 | 30 days supply | Qty: 90 | Fill #0

## 2017-04-01 MED FILL — HYDROCHLOROTHIAZIDE 25 MG T: 25 | 30 days supply | Qty: 30 | Fill #3

## 2017-04-01 MED FILL — traZODone HCL 150 MG TABS: 150 | 30 days supply | Qty: 60 | Fill #0

## 2017-04-01 MED FILL — ?HALOPERIDOL 5 MG TABLET: 5 | 30 days supply | Qty: 30 | Fill #0

## 2017-04-02 ENCOUNTER — Ambulatory Visit (INDEPENDENT_AMBULATORY_CARE_PROVIDER_SITE_OTHER): Payer: Self-pay

## 2017-04-02 ENCOUNTER — Ambulatory Visit (INDEPENDENT_AMBULATORY_CARE_PROVIDER_SITE_OTHER): Payer: Self-pay | Admitting: Family

## 2017-04-02 ENCOUNTER — Encounter (INDEPENDENT_AMBULATORY_CARE_PROVIDER_SITE_OTHER): Payer: Self-pay | Admitting: Family

## 2017-04-02 ENCOUNTER — Ambulatory Visit (INDEPENDENT_AMBULATORY_CARE_PROVIDER_SITE_OTHER): Payer: Self-pay | Admitting: Orthopaedic Surgery

## 2017-04-02 DIAGNOSIS — G8929 Other chronic pain: Secondary | ICD-10-CM

## 2017-04-02 DIAGNOSIS — M25562 Pain in left knee: Secondary | ICD-10-CM

## 2017-04-02 MED ORDER — METHYLPREDNISOLONE ACETATE 40 MG/ML IJ SUSP
40.0000 mg | INTRAMUSCULAR | Status: AC | PRN
  Administered 2017-04-02: 40 mg via INTRA_ARTICULAR

## 2017-04-02 MED ORDER — LIDOCAINE HCL 1 % IJ SOLN
5.0000 mL | INTRAMUSCULAR | Status: AC | PRN
  Administered 2017-04-02: 5 mL

## 2017-04-02 NOTE — Progress Notes (Signed)
Office Visit Note   Patient: Amanda [name]           Date of Birth: 07-16-1966           MRN: [id number] Visit Date: 04/02/2017              Requested by: Jaclyn ShaggyAmao, Enobong, MD [address] [place], [place] [id number] PCP: Jaclyn ShaggyAmao, Enobong, MD  Chief Complaint  Patient presents with  . Left Knee - Pain      HPI: The patient is a 51 year old woman who presents today complaining of 2 month history of left knee pain. Global pain. No known injury she states that she feels something is wrong with her cartilage in the morning her knee is stiff she wishes she will be able to "pop it" but feels unable to do so. She complains of some intermittent swelling and difficulty with flexion. Pain with start up.  States she is begun using a cane and is concerned she will be in a wheelchair by the time she is 59 due to knee pain.  Assessment & Plan: Visit Diagnoses:  1. Chronic pain of left knee     Plan: Depo medrol injection today. Follow up in office as needed.   Follow-Up Instructions: Return in about 4 weeks (around 04/30/2017).   Left Knee Exam   Tenderness  The patient is experiencing tenderness in the medial joint line, lateral joint line and medial retinaculum.  Range of Motion  The patient has normal left knee ROM.  Tests  Varus: negative Valgus: negative  Other  Erythema: absent Effusion: no effusion present      Patient is alert, oriented, no adenopathy, well-dressed, normal affect, normal respiratory effort.   Imaging: Xr Knee 1-2 Views Left  Result Date: 04/02/2017 Radiographs of the left knee show mild medial joint space narrowing no bone spurring.  No images are attached to the encounter.  Labs: Lab Results  Component Value Date   HGBA1C 4.90 07/24/2015   HGBA1C 5.1 11/18/2012    Orders:  Orders Placed This Encounter  Procedures  . XR Knee 1-2 Views Left   No orders of the defined types were placed in this encounter.    Procedures: Large Joint  Inj Date/Time: 04/02/2017 12:49 PM Performed by: Adonis HugueninZAMORA, ERIN R Authorized by: Barnie DelZAMORA, ERIN R   Consent Given by:  Patient Site marked: the procedure site was marked   Timeout: prior to procedure the correct patient, procedure, and site was verified   Indications:  Pain and diagnostic evaluation Location:  Knee Site:  L knee Needle Size:  22 G Needle Length:  1.5 inches Ultrasound Guidance: No   Fluoroscopic Guidance: No   Arthrogram: No   Medications:  5 mL lidocaine 1 %; 40 mg methylPREDNISolone acetate 40 MG/ML Aspiration Attempted: No   Patient tolerance:  Patient tolerated the procedure well with no immediate complications    Clinical Data: No additional findings.  ROS:  All other systems negative, except as noted in the HPI. Review of Systems  Constitutional: Negative for chills and fever.  Musculoskeletal: Positive for arthralgias. Negative for joint swelling.    Objective: Vital Signs: There were no vitals taken for this visit.  Specialty Comments:  MRI on 11/27/2015 left wrist: Severe de Quervain's tenosynovitis. Ulnar minus .Marland Kitchen. 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   S/p US guided injection of Wrist on: 12/16/15   Neuro Consult: Small fiber polyneuropathy.  PMFS History: Patient Active Problem List  Diagnosis Date Noted  . Morbid obesity (HCC) 12/24/2016  . Hyperlipidemia 12/23/2016  . Painful orthopaedic hardware (HCC) 03/24/2016  . Corns and callosities 03/24/2016  . PUD (peptic ulcer disease) 01/02/2016  . Onychomycosis 01/02/2016  . Seasonal allergies 11/19/2015  . Wrist pain, left 10/23/2015  . GERD (gastroesophageal reflux disease) 08/06/2015  . Asthma 08/06/2015  . Back pain 08/06/2015  . Migraine 08/06/2015  . Bipolar disorder (HCC) 07/25/2015  . Neuropathy 07/08/2015  . Major depressive disorder, recurrent, severe without psychotic features (HCC)   . Alcohol use disorder, severe, dependence (HCC) 11/16/2014  . Cocaine use  disorder, severe, dependence (HCC) 11/16/2014  . Acute calculous cholecystitis s/p lap chole 02/09/2014 02/09/2014  . HTN (hypertension) 07/25/2013  . Pain in vertebral column 05/05/2013  . Carpal tunnel syndrome 11/18/2012  . Numbness and tingling of both legs below knees 11/18/2012  . Lumbago 11/18/2012  . Anxiety state, unspecified 11/18/2012   Past Medical History:  Diagnosis Date  . Anxiety   . Arthritis    lower back  . Asthma   . Bipolar disorder (HCC)   . Carpal tunnel syndrome, bilateral   . Chronic back pain   . Depression   . Diverticulosis   . Fx. left wrist   . GERD (gastroesophageal reflux disease)   . Heart murmur    "born with"  . Hypertension   . IBS (irritable bowel syndrome)   . Internal hemorrhoids   . Muscle spasms of neck   . Neuropathy   . PUD (peptic ulcer disease)     Family History  Problem Relation Age of Onset  . Pancreatic cancer Mother   . Heart failure Father   . Diabetes Father   . Breast cancer Paternal Grandmother   . HIV/AIDS Brother   . Other Sister        Brain tumor  . Colon cancer Neg Hx     Past Surgical History:  Procedure Laterality Date  . ARTHRODESIS METATARSALPHALANGEAL JOINT (MTPJ) Right 04/29/2016   Procedure: RIGHT GREAT TOE METATARSOPHALANGEAL JOINT (MTPJ) FUSION, HARDWARE REMOVAL;  Surgeon: Tarry Kos, MD;  Location: [facility];  Service: Orthopedics;  Laterality: Right;  RIGHT GREAT TOE METATARSOPHALANGEAL JOINT (MTPJ) FUSION, HARDWARE REMOVAL  . CARPAL TUNNEL RELEASE Right 2003  . CHOLECYSTECTOMY N/A 02/09/2014   Procedure: LAPAROSCOPIC CHOLECYSTECTOMY WITH INTRAOPERATIVE CHOLANGIOGRAM;  Surgeon: Valarie Merino, MD;  Location: [facility];  Service: General;  Laterality: N/A;  . HAMMER TOE FUSION Bilateral 2008  . HARDWARE REMOVAL Right 04/29/2016   Procedure: HARDWARE REMOVAL;  Surgeon: Tarry Kos, MD;  Location: [facility];  Service: Orthopedics;  Laterality: Right;  HARDWARE REMOVAL  .  ROTATOR CUFF REPAIR Right 2011  . WRIST FRACTURE SURGERY Left    Social History   Occupational History  . [name]   Social History Main Topics  . Smoking status: Light Tobacco Smoker  . Smokeless tobacco: Never Used     Comment: "takes a puff sometimes"  . Alcohol use No  . Drug use: No     Comment: last used two years ago  . Sexual activity: No

## 2017-04-09 MED FILL — hydrOXYzine HCL 25 MG TABS: 25 | 30 days supply | Qty: 90 | Fill #0

## 2017-04-12 MED FILL — HYDROXYZINE PAM 50 MG CAP: 50 | 30 days supply | Qty: 90 | Fill #0

## 2017-04-15 ENCOUNTER — Other Ambulatory Visit: Payer: Self-pay | Admitting: Internal Medicine

## 2017-04-15 ENCOUNTER — Other Ambulatory Visit: Payer: Self-pay | Admitting: Family Medicine

## 2017-04-15 DIAGNOSIS — I10 Essential (primary) hypertension: Secondary | ICD-10-CM

## 2017-04-15 DIAGNOSIS — M25561 Pain in right knee: Secondary | ICD-10-CM

## 2017-04-15 MED FILL — ?METOPROLOL 100 MG TABLET: 100 | 30 days supply | Qty: 60 | Fill #0

## 2017-04-15 MED FILL — BUPROPION SR 150 MG TABLET: 150 | 30 days supply | Qty: 30 | Fill #1

## 2017-04-15 MED FILL — GABAPENTIN 800 MG TABLET: 800 | 30 days supply | Qty: 120 | Fill #1

## 2017-04-15 MED FILL — ?CETIRIZINE HCL 10 MG TABLE: 10 | 30 days supply | Qty: 30 | Fill #0

## 2017-04-15 MED FILL — ?ATORVASTATIN 20 MG TABLET: 20 | 30 days supply | Qty: 30 | Fill #0

## 2017-04-16 MED FILL — CYCLOBENZAPRINE 10 MG TAB: 10 | 30 days supply | Qty: 60 | Fill #2

## 2017-04-16 MED FILL — MELOXICAM 7.5 MG TABLET: 7.5 | 30 days supply | Qty: 30 | Fill #0

## 2017-04-21 ENCOUNTER — Other Ambulatory Visit: Payer: Self-pay | Admitting: Internal Medicine

## 2017-04-21 MED ORDER — LANSOPRAZOLE 30 MG PO CPDR: DELAYED_RELEASE_CAPSULE | ORAL | 0 refills | Status: DC

## 2017-04-21 NOTE — Telephone Encounter (Signed)
Patient advised that per last encounter, she should be on lansoprazole twice daily, not ranitidine.  She states that pharmacy just got her approved for Dexilant as we requested as well (she just got approval last week). I have advised patient that we will send 1 month of lansoprazole twice daily until her appointment with us in October and hopefully she will have already been started on Dexilant by the following month. She verbalizes understanding.

## 2017-04-21 NOTE — Telephone Encounter (Signed)
Appointment scheduled. [facility]

## 2017-04-28 ENCOUNTER — Encounter: Payer: Self-pay | Admitting: Family Medicine

## 2017-04-28 ENCOUNTER — Ambulatory Visit: Payer: Self-pay | Attending: Family Medicine | Admitting: Family Medicine

## 2017-04-28 VITALS — BP 135/84 | HR 73 | Temp 97.6°F | Ht 67.0 in | Wt 230.6 lb

## 2017-04-28 DIAGNOSIS — R21 Rash and other nonspecific skin eruption: Secondary | ICD-10-CM | POA: Insufficient documentation

## 2017-04-28 DIAGNOSIS — Z9889 Other specified postprocedural states: Secondary | ICD-10-CM | POA: Insufficient documentation

## 2017-04-28 DIAGNOSIS — R232 Flushing: Secondary | ICD-10-CM

## 2017-04-28 DIAGNOSIS — Z880 Allergy status to penicillin: Secondary | ICD-10-CM | POA: Insufficient documentation

## 2017-04-28 DIAGNOSIS — G629 Polyneuropathy, unspecified: Secondary | ICD-10-CM | POA: Insufficient documentation

## 2017-04-28 DIAGNOSIS — G5603 Carpal tunnel syndrome, bilateral upper limbs: Secondary | ICD-10-CM | POA: Insufficient documentation

## 2017-04-28 DIAGNOSIS — G8929 Other chronic pain: Secondary | ICD-10-CM | POA: Insufficient documentation

## 2017-04-28 DIAGNOSIS — F332 Major depressive disorder, recurrent severe without psychotic features: Secondary | ICD-10-CM

## 2017-04-28 DIAGNOSIS — Z79899 Other long term (current) drug therapy: Secondary | ICD-10-CM | POA: Insufficient documentation

## 2017-04-28 DIAGNOSIS — K589 Irritable bowel syndrome without diarrhea: Secondary | ICD-10-CM | POA: Insufficient documentation

## 2017-04-28 DIAGNOSIS — N920 Excessive and frequent menstruation with regular cycle: Secondary | ICD-10-CM | POA: Insufficient documentation

## 2017-04-28 DIAGNOSIS — K648 Other hemorrhoids: Secondary | ICD-10-CM | POA: Insufficient documentation

## 2017-04-28 DIAGNOSIS — Z9049 Acquired absence of other specified parts of digestive tract: Secondary | ICD-10-CM | POA: Insufficient documentation

## 2017-04-28 DIAGNOSIS — I10 Essential (primary) hypertension: Secondary | ICD-10-CM | POA: Insufficient documentation

## 2017-04-28 DIAGNOSIS — R109 Unspecified abdominal pain: Secondary | ICD-10-CM | POA: Insufficient documentation

## 2017-04-28 DIAGNOSIS — E7800 Pure hypercholesterolemia, unspecified: Secondary | ICD-10-CM | POA: Insufficient documentation

## 2017-04-28 DIAGNOSIS — N951 Menopausal and female climacteric states: Secondary | ICD-10-CM | POA: Insufficient documentation

## 2017-04-28 DIAGNOSIS — K279 Peptic ulcer, site unspecified, unspecified as acute or chronic, without hemorrhage or perforation: Secondary | ICD-10-CM | POA: Insufficient documentation

## 2017-04-28 DIAGNOSIS — M25561 Pain in right knee: Secondary | ICD-10-CM | POA: Insufficient documentation

## 2017-04-28 DIAGNOSIS — G43709 Chronic migraine without aura, not intractable, without status migrainosus: Secondary | ICD-10-CM | POA: Insufficient documentation

## 2017-04-28 DIAGNOSIS — K219 Gastro-esophageal reflux disease without esophagitis: Secondary | ICD-10-CM | POA: Insufficient documentation

## 2017-04-28 DIAGNOSIS — F419 Anxiety disorder, unspecified: Secondary | ICD-10-CM | POA: Insufficient documentation

## 2017-04-28 DIAGNOSIS — Z886 Allergy status to analgesic agent status: Secondary | ICD-10-CM | POA: Insufficient documentation

## 2017-04-28 DIAGNOSIS — F319 Bipolar disorder, unspecified: Secondary | ICD-10-CM | POA: Insufficient documentation

## 2017-04-28 DIAGNOSIS — J45909 Unspecified asthma, uncomplicated: Secondary | ICD-10-CM | POA: Insufficient documentation

## 2017-04-28 DIAGNOSIS — M545 Low back pain: Secondary | ICD-10-CM | POA: Insufficient documentation

## 2017-04-28 MED ORDER — CLONIDINE HCL 0.1 MG PO TABS: 0.1000 mg | ORAL_TABLET | Freq: Every evening | ORAL | 3 refills | Status: DC | PRN

## 2017-04-28 MED ORDER — HYDROCHLOROTHIAZIDE 25 MG PO TABS: 25.0000 mg | ORAL_TABLET | Freq: Every day | ORAL | 3 refills | Status: DC

## 2017-04-28 MED ORDER — METOPROLOL TARTRATE 100 MG PO TABS: 100.0000 mg | ORAL_TABLET | Freq: Two times a day (BID) | ORAL | 3 refills | Status: DC

## 2017-04-28 MED ORDER — TOPIRAMATE 100 MG PO TABS: 200.0000 mg | ORAL_TABLET | Freq: Two times a day (BID) | ORAL | 3 refills | Status: DC

## 2017-04-28 MED ORDER — SUMATRIPTAN SUCCINATE 50 MG PO TABS: ORAL_TABLET | ORAL | 3 refills | Status: DC

## 2017-04-28 MED ORDER — CYCLOBENZAPRINE HCL 10 MG PO TABS: 10.0000 mg | ORAL_TABLET | Freq: Two times a day (BID) | ORAL | 3 refills | Status: DC

## 2017-04-28 MED ORDER — NYSTATIN-TRIAMCINOLONE 100000-0.1 UNIT/GM-% EX OINT: 1.0000 "application " | TOPICAL_OINTMENT | Freq: Two times a day (BID) | CUTANEOUS | 0 refills | Status: DC

## 2017-04-28 MED FILL — HYDROCHLOROTHIAZIDE 25 MG T: 25 | 30 days supply | Qty: 30 | Fill #0

## 2017-04-28 MED FILL — NYSTATIN-TRIAMCINOLONE OINT: 100000-0.1 | 15 days supply | Qty: 30 | Fill #0

## 2017-04-28 MED FILL — cloNIDine HCL 0.1 MG TABS: 0.1 | 30 days supply | Qty: 30 | Fill #0

## 2017-04-28 MED FILL — SUMATRIPTAN SUCC 50 MG TABL: 50 | 9 days supply | Qty: 9 | Fill #0

## 2017-04-28 MED FILL — $DEXILANT DR 60 MG CAPSULE: 60 | 30 days supply | Qty: 30 | Fill #0

## 2017-04-28 MED FILL — TOPIRAMATE 100 MG TABLET: 100 | 30 days supply | Qty: 120 | Fill #0

## 2017-04-28 NOTE — Progress Notes (Signed)
Subjective:  Patient ID: Amanda [name], female    DOB: 15-Mar-1966  Age: 51 y.o. MRN: [id number]  CC: Hypertension   HPI Amanda [name] is a 51 year old female with a history of hypertension, bipolar disorder, neuropathy, chronic low back pain, PUD/GERD, Migraine, carpal tunnel syndrome Who comes in today for follow-up Visit.  She has chronic back pain. States this is doing well today and the R knee pain she previously controlled about is also better. Her neuropathy has been controlled ever since she was switched from gabapentin to Lyrica.  Doing well on the Topamax with infrequent headaches. Her bipolar depression is managed by mental health at [facility].  She complains of hot flashes which are worse at night and does have alternating hot and cold sensations. She has also had abdominal cramping with periods but now cramps occur even in the absence of her period. She has had dysmenorrhea for several years along with menorrhagia and passage of clots.  Has also noticed a scaly rash on her chin for the last 1 year which is not pruritic; she has also noticed some pinpoint hypopigmentation in her legs.  Past Medical History:  Diagnosis Date  . Anxiety   . Arthritis    lower back  . Asthma   . Bipolar disorder ([place])   . Carpal tunnel syndrome, bilateral   . Chronic back pain   . Depression   . Diverticulosis   . Fx. left wrist   . GERD (gastroesophageal reflux disease)   . Heart murmur    "born with"  . Hypertension   . IBS (irritable bowel syndrome)   . Internal hemorrhoids   . Muscle spasms of neck   . Neuropathy   . PUD (peptic ulcer disease)     Past Surgical History:  Procedure Laterality Date  . ARTHRODESIS METATARSALPHALANGEAL JOINT (MTPJ) Right 04/29/2016   Procedure: RIGHT GREAT TOE METATARSOPHALANGEAL JOINT (MTPJ) FUSION, HARDWARE REMOVAL;  Surgeon: Leandrew Koyanagi, MD;  Location: [place];  Service: Orthopedics;  Laterality: Right;  RIGHT GREAT TOE  METATARSOPHALANGEAL JOINT (MTPJ) FUSION, HARDWARE REMOVAL  . CARPAL TUNNEL RELEASE Right 2003  . CHOLECYSTECTOMY N/A 02/09/2014   Procedure: LAPAROSCOPIC CHOLECYSTECTOMY WITH INTRAOPERATIVE CHOLANGIOGRAM;  Surgeon: Pedro Earls, MD;  [facility];  Service: General;  Laterality: N/A;  . HAMMER TOE FUSION Bilateral 2008  . HARDWARE REMOVAL Right 04/29/2016   Procedure: HARDWARE REMOVAL;  Surgeon: Leandrew Koyanagi, MD;  Location: [place];  Service: Orthopedics;  Laterality: Right;  HARDWARE REMOVAL  . ROTATOR CUFF REPAIR Right 2011  . WRIST FRACTURE SURGERY Left     Allergies  Allergen Reactions  . Penicillins Anaphylaxis    Has patient had a PCN reaction causing immediate rash, facial/tongue/throat swelling, SOB or lightheadedness with hypotension: Yes Has patient had a PCN reaction causing severe rash involving mucus membranes or skin necrosis: Yes Has patient had a PCN reaction that required hospitalization Yes Has patient had a PCN reaction occurring within the last 10 years: No-more than 10 years ago If all of the above answers are "NO", then may proceed with Cephalosporin use.   Diona Fanti [Aspirin] Rash     Outpatient Medications Prior to Visit  Medication Sig Dispense Refill  . albuterol (VENTOLIN HFA) 108 (90 Base) MCG/ACT inhaler Inhale 2 puffs into the lungs every 6 (six) hours as needed for wheezing or shortness of breath. 54 g 3  . atorvastatin (LIPITOR) 20 MG tablet TAKE 1 TABLET  BY MOUTH DAILY 30 tablet 2  . cetirizine (ZYRTEC) 10 MG tablet TAKE 1 TABLET BY MOUTH DAILY. 30 tablet 2  . colestipol (COLESTID) 1 g tablet Take 1 tablet (1 g total) by mouth every morning. 30 tablet 4  . dexlansoprazole (DEXILANT) 60 MG capsule Take 1 capsule (60 mg total) by mouth daily. 90 capsule 3  . divalproex (DEPAKOTE) 500 MG DR tablet Take 500 mg by mouth 3 (three) times daily.    . haloperidol (HALDOL) 5 MG tablet Take 1 tablet by mouth daily.  0  . hydrOXYzine  (ATARAX/VISTARIL) 50 MG tablet Take 1 tablet (50 mg total) by mouth 3 (three) times daily as needed for anxiety or itching. 90 tablet 2  . lansoprazole (PREVACID) 30 MG capsule Take 1 cap po bid ac u ntil patient assistance for Dexilant has been completed (pharmacy please send for dexilant as previously rx'ed-thx) 60 capsule 0  . meloxicam (MOBIC) 7.5 MG tablet TAKE 1 TABLET BY MOUTH DAILY. 30 tablet 2  . olopatadine (PATANOL) 0.1 % ophthalmic solution PLACE 1 DROP INTO BOTH EYES 2 TIMES DAILY. 5 mL 0  . pregabalin (LYRICA) 75 MG capsule Take 1 capsule (75 mg total) by mouth 2 (two) times daily. 180 capsule 3  . ranitidine (ZANTAC) 150 MG capsule Take 1 capsule (150 mg total) by mouth every evening. 30 capsule 3  . traZODone (DESYREL) 300 MG tablet Take 1 tablet (300 mg total) by mouth at bedtime. 30 tablet 0  . albuterol (PROAIR HFA) 108 (90 Base) MCG/ACT inhaler Inhale 2 puffs into the lungs every 6 (six) hours as needed for wheezing or shortness of breath. 1 Inhaler 1  . cyclobenzaprine (FLEXERIL) 10 MG tablet TAKE 1 TABLET BY MOUTH 2 TIMES DAILY. 60 tablet 2  . hydrochlorothiazide (HYDRODIURIL) 25 MG tablet Take 1 tablet (25 mg total) by mouth daily. 30 tablet 3  . metoprolol tartrate (LOPRESSOR) 100 MG tablet TAKE 1 TABLET BY MOUTH 2 TIMES DAILY. 60 tablet 2  . SUMAtriptan (IMITREX) 50 MG tablet TAKE 50 MG ORALLY AT THE ONSET OF A MIGRAINE. MAY REPEAT IN 2 HOURS IF HEADACHE PERSISTS OR RECURS. MAXIMUM DAILY DOSE 200 MG 10 tablet 2  . topiramate (TOPAMAX) 100 MG tablet TAKE 2 TABLETS BY MOUTH 2 TIMES DAILY. 120 tablet 3  . mirtazapine (REMERON) 30 MG tablet Take 45 mg by mouth at bedtime.     Facility-Administered Medications Prior to Visit  Medication Dose Route Frequency Provider Last Rate Last Dose  . lidocaine (PF) (XYLOCAINE) 1 % injection 0.3 mL  0.3 mL Other Once Magnus Sinning, MD        ROS Review of Systems Constitutional: Negative for activity change, appetite change and  fatigue.  HENT: Negative for congestion, sinus pressure and sore throat.   Eyes: Negative for visual disturbance.  Respiratory: Negative for cough, chest tightness, shortness of breath and wheezing.   Cardiovascular: Negative for chest pain and palpitations.  Gastrointestinal: Negative for abdominal distention, abdominal pain and constipation.  Endocrine: Negative for polydipsia.  Genitourinary: Negative for dysuria and frequency.  Musculoskeletal:       See hpi  Skin: Positive for rash.  Neurological: Negative for tremors, light-headedness and positive numbness.  Hematological: Does not bruise/bleed easily.  Psychiatric/Behavioral: Negative for agitation and behavioral problems.  Objective:  BP 135/84   Pulse 73   Temp 97.6 F (36.4 C) (Oral)   Ht '5\' 7"'$  (1.702 m)   Wt 230 lb 9.6 oz (104.6 kg)  LMP 04/05/2017   SpO2 100%   BMI 36.12 kg/m   BP/Weight 04/28/2017 01/29/6719 04/28/7095  Systolic BP 283 662 947  Diastolic BP 84 85 654  Wt. (Lbs) 230.6 234 233  BMI 36.12 36.65 36.49  Some encounter information is confidential and restricted. Go to Review Flowsheets activity to see all data.      Physical Exam Constitutional: She is oriented to person, place, and time. She appears well-developed and well-nourished.  HEENT: Normal Neck: No JVD Cardiovascular: Normal rate, normal heart sounds and intact distal pulses.   No murmur heard. Pulmonary/Chest: Effort normal and breath sounds normal. She has no wheezes. She has no rales. She exhibits no tenderness.  Abdominal: Soft. Bowel sounds are normal. She exhibits no distension and no mass. There is no tenderness.  Musculoskeletal: Normal range of motion. She exhibits  She exhibits no edema or deformity.  Crepitus on range of motion of the right knee  Neurological: She is alert and oriented to person, place, and time.  Skin: Scaly rash on anterior aspect of chin  Psychiatric: She has a normal mood and affect.   CMP Latest Ref Rng &  Units 12/18/2016 04/28/2016 01/29/2016  Glucose 65 - 99 mg/dL 119(H) 87 89  BUN 6 - 24 mg/dL '9 8 11  '$ Creatinine 0.57 - 1.00 mg/dL 0.83 0.86 1.04  Sodium 134 - 144 mmol/L 141 139 140  Potassium 3.5 - 5.2 mmol/L 4.1 3.9 4.2  Chloride 96 - 106 mmol/L 106 112(H) 104  CO2 18 - 29 mmol/L 18 20(L) 30  Calcium 8.7 - 10.2 mg/dL 9.6 8.8(L) 8.7  Total Protein 6.0 - 8.5 g/dL 6.8 - 6.0  Total Bilirubin 0.0 - 1.2 mg/dL <0.2 - 0.3  Alkaline Phos 39 - 117 IU/L 99 - 58  AST 0 - 40 IU/L 24 - 16  ALT 0 - 32 IU/L 42(H) - 16    Lipid Panel     Component Value Date/Time   CHOL 234 (H) 12/18/2016 1021   TRIG 144 12/18/2016 1021   HDL 62 12/18/2016 1021   CHOLHDL 3.8 12/18/2016 1021   CHOLHDL 2.9 11/27/2015 1038   VLDL 9 11/27/2015 1038   LDLCALC 143 (H) 12/18/2016 1021    Assessment & Plan:   1. Chronic migraine without aura without status migrainosus, not intractable Controlled - topiramate (TOPAMAX) 100 MG tablet; Take 2 tablets (200 mg total) by mouth 2 (two) times daily.  Dispense: 120 tablet; Refill: 3 - SUMAtriptan (IMITREX) 50 MG tablet; TAKE 50 MG ORALLY AT THE ONSET OF A MIGRAINE. MAY REPEAT IN 2 HOURS IF HEADACHE PERSISTS OR RECURS. MAXIMUM DAILY DOSE 200 MG  Dispense: 10 tablet; Refill: 3  2. Essential hypertension Slightly above goal of less than 130/80 Low-sodium diet, no regimen change for now - CMP14+EGFR - metoprolol tartrate (LOPRESSOR) 100 MG tablet; Take 1 tablet (100 mg total) by mouth 2 (two) times daily.  Dispense: 60 tablet; Refill: 3 - hydrochlorothiazide (HYDRODIURIL) 25 MG tablet; Take 1 tablet (25 mg total) by mouth daily.  Dispense: 30 tablet; Refill: 3  3. PUD (peptic ulcer disease) Controlled Continue Dexilant Managed by gastroenterology  4. Neuropathy Doing well on Lyrica  5. Bipolar/ Major depressive disorder, recurrent, severe without psychotic features ([place]) Stable Management as per psychiatry  6. Hot flashes Will commence clonidine - cloNIDine (CATAPRES)  0.1 MG tablet; Take 1 tablet (0.1 mg total) by mouth at bedtime as needed. For hot flashes  Dispense: 30 tablet; Refill: 3  7. Menorrhagia with  regular cycle Will need to exclude underlying fibroids versus perimenopausal phase - US Pelvis Complete; Future - US Transvaginal Non-OB; Future  8. Pure hypercholesterolemia Uncontrolled We will adjust atorvastatin dose once lipid panel is obtained Low-cholesterol diet - Lipid panel  9. Rash and nonspecific skin eruption - nystatin-triamcinolone ointment (MYCOLOG); Apply 1 application topically 2 (two) times daily. Apply to rash on face  Dispense: 30 g; Refill: 0   Meds ordered this encounter  Medications  . topiramate (TOPAMAX) 100 MG tablet    Sig: Take 2 tablets (200 mg total) by mouth 2 (two) times daily.    Dispense:  120 tablet    Refill:  3  . SUMAtriptan (IMITREX) 50 MG tablet    Sig: TAKE 50 MG ORALLY AT THE ONSET OF A MIGRAINE. MAY REPEAT IN 2 HOURS IF HEADACHE PERSISTS OR RECURS. MAXIMUM DAILY DOSE 200 MG    Dispense:  10 tablet    Refill:  3  . metoprolol tartrate (LOPRESSOR) 100 MG tablet    Sig: Take 1 tablet (100 mg total) by mouth 2 (two) times daily.    Dispense:  60 tablet    Refill:  3  . cyclobenzaprine (FLEXERIL) 10 MG tablet    Sig: Take 1 tablet (10 mg total) by mouth 2 (two) times daily. prn    Dispense:  60 tablet    Refill:  3  . hydrochlorothiazide (HYDRODIURIL) 25 MG tablet    Sig: Take 1 tablet (25 mg total) by mouth daily.    Dispense:  30 tablet    Refill:  3    Discontinue previous dose  . cloNIDine (CATAPRES) 0.1 MG tablet    Sig: Take 1 tablet (0.1 mg total) by mouth at bedtime as needed. For hot flashes    Dispense:  30 tablet    Refill:  3  . nystatin-triamcinolone ointment (MYCOLOG)    Sig: Apply 1 application topically 2 (two) times daily. Apply to rash on face    Dispense:  30 g    Refill:  0    Follow-up: Return in about 3 months (around 07/28/2017) for Follow-up on menorrhagia, rash.     Arnoldo Morale MD

## 2017-05-03 ENCOUNTER — Ambulatory Visit (HOSPITAL_COMMUNITY): Payer: Self-pay

## 2017-05-06 ENCOUNTER — Ambulatory Visit (HOSPITAL_COMMUNITY): Payer: Self-pay

## 2017-05-10 MED FILL — ?CYCLOBENZAPRINE 10 MG TABL: 10 | 30 days supply | Qty: 60 | Fill #0

## 2017-05-11 MED FILL — ?ATORVASTATIN 20 MG TABLET: 20 | 30 days supply | Qty: 30 | Fill #1

## 2017-05-11 MED FILL — traZODone HCL 150 MG TABS: 150 | 30 days supply | Qty: 60 | Fill #1

## 2017-05-11 MED FILL — HYDROXYZINE PAM 50 MG CAP: 50 | 30 days supply | Qty: 90 | Fill #1

## 2017-05-11 MED FILL — ?CETIRIZINE HCL 10 MG TABLE: 10 | 30 days supply | Qty: 30 | Fill #1

## 2017-05-11 MED FILL — BUPROPION SR 150 MG TABLET: 150 | 30 days supply | Qty: 30 | Fill #2

## 2017-05-11 MED FILL — GABAPENTIN 800 MG TABLET: 800 | 30 days supply | Qty: 120 | Fill #2

## 2017-05-11 MED FILL — METOPROLOL TARTRATE 100 MG: 100 | 30 days supply | Qty: 60 | Fill #0

## 2017-05-11 MED FILL — MELOXICAM 7.5 MG TABLET: 7.5 | 30 days supply | Qty: 30 | Fill #1

## 2017-05-11 MED FILL — ?HALOPERIDOL 5 MG TABLET: 5 | 30 days supply | Qty: 30 | Fill #1

## 2017-05-13 ENCOUNTER — Ambulatory Visit (HOSPITAL_COMMUNITY): Payer: Self-pay

## 2017-05-24 MED FILL — $DEXILANT DR 60 MG CAPSULE: 60 | 30 days supply | Qty: 30 | Fill #1

## 2017-05-24 MED FILL — ?METOPROLOL 100 MG TABLET: 100 | 30 days supply | Qty: 60 | Fill #1

## 2017-05-24 MED FILL — HYDROCHLOROTHIAZIDE 25 MG T: 25 | 30 days supply | Qty: 30 | Fill #1

## 2017-05-24 MED FILL — cloNIDine HCL 0.1 MG TABS: 0.1 | 30 days supply | Qty: 30 | Fill #1

## 2017-05-24 MED FILL — TOPIRAMATE 100 MG TABLET: 100 | 30 days supply | Qty: 120 | Fill #1

## 2017-05-27 ENCOUNTER — Ambulatory Visit (INDEPENDENT_AMBULATORY_CARE_PROVIDER_SITE_OTHER): Payer: Self-pay | Admitting: Orthopaedic Surgery

## 2017-05-27 DIAGNOSIS — M25562 Pain in left knee: Secondary | ICD-10-CM

## 2017-05-27 DIAGNOSIS — G8929 Other chronic pain: Secondary | ICD-10-CM

## 2017-05-27 NOTE — Progress Notes (Signed)
Office Visit Note   Patient: Amanda [name]           Date of Birth: [birth year]           MRN: [id number] Visit Date: 05/27/2017              Requested by: Jaclyn Shaggy, MD [address] [place], [place] [address] PCP: Jaclyn Shaggy, MD   Assessment & Plan: Visit Diagnoses:  1. Chronic pain of left knee     Plan: MRI of the left knee to rule out medial meniscal tear. Patient has failed conservative treatment. Follow up after the MRI.  Follow-Up Instructions: Return in about 10 days (around 06/06/2017).   Orders:  Orders Placed This Encounter  Procedures  . MR Knee Left w/o contrast   No orders of the defined types were placed in this encounter.     Procedures: No procedures performed   Clinical Data: No additional findings.   Subjective: No chief complaint on file.   Amanda [name] follows up today for continued left knee pain mainly on the medial aspect. She received a previous injection which has not given her any significant relief. She states she had pain relief for that day from the injection. Denies any numbness and tingling. Denies any injuries.    Review of Systems  Constitutional: Negative.   HENT: Negative.   Eyes: Negative.   Respiratory: Negative.   Cardiovascular: Negative.   Endocrine: Negative.   Musculoskeletal: Negative.   Neurological: Negative.   Hematological: Negative.   Psychiatric/Behavioral: Negative.   All other systems reviewed and are negative.    Objective: Vital Signs: There were no vitals taken for this visit.  Physical Exam  Constitutional: She is oriented to person, place, and time. She appears well-developed and well-nourished.  Pulmonary/Chest: Effort normal.  Neurological: She is alert and oriented to person, place, and time.  Skin: Skin is warm. Capillary refill takes less than 2 seconds.  Psychiatric: She has a normal mood and affect. Her behavior is normal. Judgment and thought content normal.  Nursing note and  vitals reviewed.   Ortho Exam Left knee exam shows no joint effusion. Medial joint line tenderness. Specialty Comments:  MRI on 11/27/2015 left wrist: Severe de Quervain's tenosynovitis. Ulnar minus .Marland Kitchen Small enchondroma of the proximal pole of the scaphoid.   S/p US guided injection of Wrist on: 12/16/15   Neuro Consult: Small fiber polyneuropathy.  Imaging: No results found.   PMFS History: Patient Active Problem List   Diagnosis Date Noted  . Morbid obesity ([facility]) 12/24/2016  . Hyperlipidemia 12/23/2016  . Painful orthopaedic hardware ([facility]) 03/24/2016  . Corns and callosities 03/24/2016  . PUD (peptic ulcer disease) 01/02/2016  . Onychomycosis 01/02/2016  . Seasonal allergies 11/19/2015  . Wrist pain, left 10/23/2015  . GERD (gastroesophageal reflux disease) 08/06/2015  . Asthma 08/06/2015  . Back pain 08/06/2015  . Migraine 08/06/2015  . Bipolar disorder ([facility]) 07/25/2015  . Neuropathy 07/08/2015  . Major depressive disorder, recurrent, severe without psychotic features ([facility])   . Alcohol use disorder, severe, dependence ([facility]) 11/16/2014  . Cocaine use disorder, severe, dependence ([facility]) 11/16/2014  . Acute calculous cholecystitis s/p lap chole 02/09/2014 02/09/2014  . HTN (hypertension) 07/25/2013  . Pain in vertebral column 05/05/2013  . Carpal tunnel syndrome 11/18/2012  . Numbness and tingling of both legs below knees 11/18/2012  . Lumbago 11/18/2012  . Anxiety state, unspecified 11/18/2012   Past Medical History:  Diagnosis Date  . Anxiety   .  Arthritis    lower back  . Asthma   . Bipolar disorder ([facility])   . Carpal tunnel syndrome, bilateral   . Chronic back pain   . Depression   . Diverticulosis   . Fx. left wrist   . GERD (gastroesophageal reflux disease)   . Heart murmur    "born with"  . Hypertension   . IBS (irritable bowel syndrome)   . Internal hemorrhoids   . Muscle spasms of neck   . Neuropathy   . PUD (peptic ulcer disease)     Family History    Problem Relation Age of Onset  . Pancreatic cancer Mother   . Heart failure Father   . Diabetes Father   . Breast cancer Paternal Grandmother   . HIV/AIDS Brother   . Other Sister        Brain tumor  . Colon cancer Neg Hx     Past Surgical History:  Procedure Laterality Date  . ARTHRODESIS METATARSALPHALANGEAL JOINT (MTPJ) Right 04/29/2016   Procedure: RIGHT GREAT TOE METATARSOPHALANGEAL JOINT (MTPJ) FUSION, HARDWARE REMOVAL;  Surgeon: Tarry Kos, MD;  Location: [facility];  Service: Orthopedics;  Laterality: Right;  RIGHT GREAT TOE METATARSOPHALANGEAL JOINT (MTPJ) FUSION, HARDWARE REMOVAL  . CARPAL TUNNEL RELEASE Right 2003  . CHOLECYSTECTOMY N/A 02/09/2014   Procedure: LAPAROSCOPIC CHOLECYSTECTOMY WITH INTRAOPERATIVE CHOLANGIOGRAM;  Surgeon: Valarie Merino, MD;  [facility];  Service: General;  Laterality: N/A;  . HAMMER TOE FUSION Bilateral 2008  . HARDWARE REMOVAL Right 04/29/2016   Procedure: HARDWARE REMOVAL;  Surgeon: Tarry Kos, MD;  Location: [facility];  Service: Orthopedics;  Laterality: Right;  HARDWARE REMOVAL  . ROTATOR CUFF REPAIR Right 2011  . WRIST FRACTURE SURGERY Left    Social History   Occupational History  . Amanda [name]   Social History Main Topics  . Smoking status: Light Tobacco Smoker  . Smokeless tobacco: Never Used     Comment: "takes a puff sometimes"  . Alcohol use No  . Drug use: No     Comment: last used two years ago  . Sexual activity: No

## 2017-05-31 ENCOUNTER — Ambulatory Visit: Payer: Self-pay | Attending: Family Medicine

## 2017-06-01 ENCOUNTER — Ambulatory Visit (HOSPITAL_COMMUNITY): Payer: Self-pay

## 2017-06-04 ENCOUNTER — Ambulatory Visit (HOSPITAL_COMMUNITY)
Admission: RE | Admit: 2017-06-04 | Discharge: 2017-06-04 | Disposition: A | Discharge status: Home / Self Care | Payer: No Typology Code available for payment source | Source: Ambulatory Visit | Attending: Family Medicine | Admitting: Family Medicine

## 2017-06-04 DIAGNOSIS — N920 Excessive and frequent menstruation with regular cycle: Secondary | ICD-10-CM | POA: Insufficient documentation

## 2017-06-07 MED FILL — MELOXICAM 7.5 MG TABLET: 7.5 | 30 days supply | Qty: 30 | Fill #2

## 2017-06-07 MED FILL — METOPROLOL TARTRATE 100 MG: 100 | 30 days supply | Qty: 60 | Fill #1

## 2017-06-07 MED FILL — ?CETIRIZINE HCL 10 MG TABLE: 10 | 30 days supply | Qty: 30 | Fill #2

## 2017-06-07 MED FILL — traZODone HCL 150 MG TABS: 150 | 30 days supply | Qty: 60 | Fill #2

## 2017-06-07 MED FILL — ?HALOPERIDOL 5 MG TABLET: 5 | 30 days supply | Qty: 30 | Fill #2

## 2017-06-07 MED FILL — ?ATORVASTATIN 20 MG TABLET: 20 | 30 days supply | Qty: 30 | Fill #2

## 2017-06-08 MED FILL — ?CYCLOBENZAPRINE 10 MG TABL: 10 | 30 days supply | Qty: 60 | Fill #1

## 2017-06-10 MED FILL — GABAPENTIN 800 MG TABLET: 800 | 30 days supply | Qty: 120 | Fill #0

## 2017-06-10 MED FILL — DIVALPROEX SOD DR 500 MG TA: 500 | 30 days supply | Qty: 90 | Fill #1

## 2017-06-11 ENCOUNTER — Ambulatory Visit
Admission: RE | Admit: 2017-06-11 | Discharge: 2017-06-11 | Disposition: A | Discharge status: Home / Self Care | Payer: No Typology Code available for payment source | Source: Ambulatory Visit | Attending: Orthopaedic Surgery | Admitting: Orthopaedic Surgery

## 2017-06-11 ENCOUNTER — Telehealth: Payer: Self-pay

## 2017-06-11 DIAGNOSIS — M25562 Pain in left knee: Principal | ICD-10-CM

## 2017-06-11 DIAGNOSIS — G8929 Other chronic pain: Secondary | ICD-10-CM

## 2017-06-11 NOTE — Telephone Encounter (Signed)
Pt was called and informed of lab results. 

## 2017-06-14 ENCOUNTER — Other Ambulatory Visit: Payer: Self-pay

## 2017-06-14 ENCOUNTER — Ambulatory Visit (INDEPENDENT_AMBULATORY_CARE_PROVIDER_SITE_OTHER): Payer: Self-pay | Admitting: Orthopaedic Surgery

## 2017-06-14 ENCOUNTER — Encounter (INDEPENDENT_AMBULATORY_CARE_PROVIDER_SITE_OTHER): Payer: Self-pay | Admitting: Orthopaedic Surgery

## 2017-06-14 DIAGNOSIS — G8929 Other chronic pain: Secondary | ICD-10-CM

## 2017-06-14 DIAGNOSIS — M25562 Pain in left knee: Secondary | ICD-10-CM

## 2017-06-14 DIAGNOSIS — M1712 Unilateral primary osteoarthritis, left knee: Secondary | ICD-10-CM

## 2017-06-14 MED ORDER — METHYLPREDNISOLONE ACETATE 40 MG/ML IJ SUSP
40.0000 mg | INTRAMUSCULAR | Status: AC | PRN
  Administered 2017-06-14: 40 mg via INTRA_ARTICULAR

## 2017-06-14 MED ORDER — LIDOCAINE HCL 1 % IJ SOLN
2.0000 mL | INTRAMUSCULAR | Status: AC | PRN
  Administered 2017-06-14: 2 mL

## 2017-06-14 MED ORDER — PREGABALIN 75 MG PO CAPS: 75.0000 mg | ORAL_CAPSULE | Freq: Two times a day (BID) | ORAL | 1 refills | Status: DC

## 2017-06-14 MED ORDER — BUPIVACAINE HCL 0.5 % IJ SOLN
2.0000 mL | INTRAMUSCULAR | Status: AC | PRN
  Administered 2017-06-14: 2 mL via INTRA_ARTICULAR

## 2017-06-14 MED FILL — hydrOXYzine HCL 25 MG TABS: 25 | 30 days supply | Qty: 90 | Fill #0

## 2017-06-14 NOTE — Progress Notes (Signed)
Office Visit Note   Patient: Amanda [name]           Date of Birth: 27-Jun-1966           MRN: [id number] Visit Date: 06/14/2017              Requested by: Jaclyn ShaggyAmao, [place], MD [address] [place], [place] [id number] PCP: Jaclyn ShaggyAmao, Enobong, MD   Assessment & Plan: Visit Diagnoses:  1. Chronic pain of left knee   2. Unilateral primary osteoarthritis, left knee     Plan: Left knee shows degenerative changes of the medial and patellofemoral compartment without any meniscal pathology.  Large joint effusion was aspirated today and injected with cortisone.  We will follow-up with the patient regarding the fluid results.  Follow-Up Instructions: Return if symptoms worsen or fail to improve.   Orders:  Orders Placed This Encounter  Procedures  . Synovial cell count + diff, w/ crystals   No orders of the defined types were placed in this encounter.     Procedures: Large Joint Inj Date/Time: 06/14/2017 10:42 AM Performed by: Tarry KosXU, NAIPING M Authorized by: Tarry KosXU, NAIPING M   Consent Given by:  Patient Timeout: prior to procedure the correct patient, procedure, and site was verified   Indications:  Pain Location:  Knee Site:  L knee Prep: patient was prepped and draped in usual sterile fashion   Needle Size:  22 G Ultrasound Guidance: No   Fluoroscopic Guidance: No   Arthrogram: No   Medications:  2 mL lidocaine 1 %; 2 mL bupivacaine 0.5 %; 40 mg methylPREDNISolone acetate 40 MG/ML Aspirate amount (mL):  30 Aspirate:  Clear Patient tolerance:  Patient tolerated the procedure well with no immediate complications     Clinical Data: No additional findings.   Subjective: Chief Complaint  Patient presents with  . Left Knee - Pain, Follow-up    Patient follows up today for her left knee MRI.  She continues to have a large joint effusion.    Review of Systems  Constitutional: Negative.   HENT: Negative.   Eyes: Negative.   Respiratory: Negative.   Cardiovascular:  Negative.   Endocrine: Negative.   Musculoskeletal: Negative.   Neurological: Negative.   Hematological: Negative.   Psychiatric/Behavioral: Negative.   All other systems reviewed and are negative.    Objective: Vital Signs: LMP 06/10/2017   Physical Exam  Constitutional: She is oriented to person, place, and time. She appears well-developed and well-nourished.  Pulmonary/Chest: Effort normal.  Neurological: She is alert and oriented to person, place, and time.  Skin: Skin is warm. Capillary refill takes less than 2 seconds.  Psychiatric: She has a normal mood and affect. Her behavior is normal. Judgment and thought content normal.  Nursing note and vitals reviewed.   Ortho Exam Left knee exam shows a large effusion.  Exam is otherwise unchanged Specialty Comments:  MRI on 11/27/2015 left wrist: Severe de Quervain's tenosynovitis. Ulnar minus .Marland Kitchen. 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   S/p US guided injection of Wrist on: 12/16/15   Neuro Consult: Small fiber polyneuropathy.  Imaging: No results found.   PMFS History: Patient Active Problem List   Diagnosis Date Noted  . Morbid obesity ([facility]) 12/24/2016  . Hyperlipidemia 12/23/2016  . Painful orthopaedic hardware ([facility]) 03/24/2016  . Corns and callosities 03/24/2016  . PUD (peptic ulcer disease) 01/02/2016  . Onychomycosis 01/02/2016  . Seasonal allergies 11/19/2015  . Wrist pain, left 10/23/2015  . GERD (  gastroesophageal reflux disease) 08/06/2015  . Asthma 08/06/2015  . Back pain 08/06/2015  . Migraine 08/06/2015  . Bipolar disorder ([facility]) 07/25/2015  . Neuropathy 07/08/2015  . Major depressive disorder, recurrent, severe without psychotic features ([facility])   . Alcohol use disorder, severe, dependence ([facility]) 11/16/2014  . Cocaine use disorder, severe, dependence ([facility]CC) 11/16/2014  . Acute calculous cholecystitis s/p lap chole 02/09/2014 02/09/2014  . HTN (hypertension) 07/25/2013  . Pain in vertebral  column 05/05/2013  . Carpal tunnel syndrome 11/18/2012  . Numbness and tingling of both legs below knees 11/18/2012  . Lumbago 11/18/2012  . Anxiety state, unspecified 11/18/2012   Past Medical History:  Diagnosis Date  . Anxiety   . Arthritis    lower back  . Asthma   . Bipolar disorder ([facility])   . Carpal tunnel syndrome, bilateral   . Chronic back pain   . Depression   . Diverticulosis   . Fx. left wrist   . GERD (gastroesophageal reflux disease)   . Heart murmur    "born with"  . Hypertension   . IBS (irritable bowel syndrome)   . Internal hemorrhoids   . Muscle spasms of neck   . Neuropathy   . PUD (peptic ulcer disease)     Family History  Problem Relation Age of Onset  . Pancreatic cancer Mother   . Heart failure Father   . Diabetes Father   . Breast cancer Paternal Grandmother   . HIV/AIDS Brother   . Other Sister        Brain tumor  . Colon cancer Neg Hx     Past Surgical History:  Procedure Laterality Date  . ARTHRODESIS METATARSALPHALANGEAL JOINT (MTPJ) Right 04/29/2016   Procedure: RIGHT GREAT TOE METATARSOPHALANGEAL JOINT (MTPJ) FUSION, HARDWARE REMOVAL;  Surgeon: Tarry Kos, MD;  Location: [facility];  Service: Orthopedics;  Laterality: Right;  RIGHT GREAT TOE METATARSOPHALANGEAL JOINT (MTPJ) FUSION, HARDWARE REMOVAL  . CARPAL TUNNEL RELEASE Right 2003  . CHOLECYSTECTOMY N/A 02/09/2014   Procedure: LAPAROSCOPIC CHOLECYSTECTOMY WITH INTRAOPERATIVE CHOLANGIOGRAM;  Surgeon: Valarie Merino, MD;  [facility];  Service: General;  Laterality: N/A;  . HAMMER TOE FUSION Bilateral 2008  . HARDWARE REMOVAL Right 04/29/2016   Procedure: HARDWARE REMOVAL;  Surgeon: Tarry Kos, MD;  Location: [facility];  Service: Orthopedics;  Laterality: Right;  HARDWARE REMOVAL  . ROTATOR CUFF REPAIR Right 2011  . WRIST FRACTURE SURGERY Left    Social History   Occupational History  . [name]   Social History Main Topics  . Smoking  status: Light Tobacco Smoker  . Smokeless tobacco: Never Used     Comment: "takes a puff sometimes"  . Alcohol use No  . Drug use: No     Comment: last used two years ago  . Sexual activity: No

## 2017-06-15 LAB — SYNOVIAL CELL COUNT + DIFF, W/ CRYSTALS
Basophils, %: 0 %
Eosinophils-Synovial: 0 % (ref 0–2)
Lymphocytes-Synovial Fld: 45 % (ref 0–74)
Monocyte/Macrophage: 54 % (ref 0–69)
Neutrophil, Synovial: 1 % (ref 0–24)
Synoviocytes, %: 0 % (ref 0–15)
WBC, Synovial: 392 cells/uL — ABNORMAL HIGH (ref <150)

## 2017-06-15 LAB — TIQ-NTM

## 2017-06-15 NOTE — Progress Notes (Signed)
Please let her know she had a pseudogout flare up.  She doesn't need to do anything about it.  The sterile injection is going to treat it

## 2017-06-16 ENCOUNTER — Ambulatory Visit: Payer: Self-pay | Admitting: Internal Medicine

## 2017-06-25 MED FILL — hydrOXYzine HCL 50 MG TABS: 50 | 30 days supply | Qty: 90 | Fill #0

## 2017-06-25 MED FILL — BUPROPION SR 150 MG TABLET: 150 | 30 days supply | Qty: 30 | Fill #0

## 2017-06-29 ENCOUNTER — Ambulatory Visit (INDEPENDENT_AMBULATORY_CARE_PROVIDER_SITE_OTHER): Payer: Self-pay | Admitting: Orthopaedic Surgery

## 2017-06-29 ENCOUNTER — Telehealth (INDEPENDENT_AMBULATORY_CARE_PROVIDER_SITE_OTHER): Payer: Self-pay | Admitting: Orthopaedic Surgery

## 2017-06-29 NOTE — Telephone Encounter (Signed)
Patient called requesting a phone call from Dr. Roda ShuttersXu regarding her knee, she said it's still hurting pretty badly and she just wanted his advice. CB # [phone]

## 2017-06-29 NOTE — Telephone Encounter (Signed)
See message.

## 2017-06-29 NOTE — Telephone Encounter (Signed)
We can try another steroid injection.

## 2017-06-30 NOTE — Telephone Encounter (Signed)
Would like to come in on friday. Appt made

## 2017-07-02 ENCOUNTER — Telehealth (INDEPENDENT_AMBULATORY_CARE_PROVIDER_SITE_OTHER): Payer: Self-pay | Admitting: Orthopaedic Surgery

## 2017-07-02 ENCOUNTER — Ambulatory Visit (INDEPENDENT_AMBULATORY_CARE_PROVIDER_SITE_OTHER): Payer: Self-pay | Admitting: Orthopaedic Surgery

## 2017-07-02 DIAGNOSIS — M1712 Unilateral primary osteoarthritis, left knee: Secondary | ICD-10-CM

## 2017-07-02 NOTE — Telephone Encounter (Signed)
Patient had a couple of questions- did not want to specify. Please call patient back. Thanks!   [phone]

## 2017-07-02 NOTE — Progress Notes (Signed)
Office Visit Note   Patient: Amanda [name]           Date of Birth: Jun 13, 1966           MRN: [id number] Visit Date: 07/02/2017              Requested by: Jaclyn ShaggyAmao, Enobong, MD [address] [place], [place] [id number] PCP: Jaclyn ShaggyAmao, Enobong, MD   Assessment & Plan: Visit Diagnoses:  1. Unilateral primary osteoarthritis, left knee     Plan: Impression is left knee degenerative joint disease.  Patient has not gotten minimal and temporary relief from cortisone injection.  I do not think this is severe enough to warrant a knee replacement.  If her pain continues to be bothersome we could consider diagnostic arthroscopy and debridement of her chondromalacia and synovitis to see if this will improve her symptoms.  Questions encouraged and answered.  Follow-up as needed.  She will let us know how she wants to proceed. Total face to face encounter time was greater than 25 minutes and over half of this time was spent in counseling and/or coordination of care.  Follow-Up Instructions: Return if symptoms worsen or fail to improve.   Orders:  No orders of the defined types were placed in this encounter.  No orders of the defined types were placed in this encounter.     Procedures: No procedures performed   Clinical Data: No additional findings.   Subjective: No chief complaint on file.   Patient is following up today for her left knee pain.  She had temporary relief from the previous cortisone injection.  She is currently not taking any medicines for the pain.  Her MRI did show mild to moderate degenerative joint disease without any acute findings.    Review of Systems  Constitutional: Negative.   HENT: Negative.   Eyes: Negative.   Respiratory: Negative.   Cardiovascular: Negative.   Endocrine: Negative.   Musculoskeletal: Negative.   Neurological: Negative.   Hematological: Negative.   Psychiatric/Behavioral: Negative.   All other systems reviewed and are  negative.    Objective: Vital Signs: LMP 06/10/2017   Physical Exam  Constitutional: She is oriented to person, place, and time. She appears well-developed and well-nourished.  HENT:  Head: Normocephalic and atraumatic.  Eyes: EOM are normal.  Neck: Neck supple.  Pulmonary/Chest: Effort normal.  Abdominal: Soft.  Neurological: She is alert and oriented to person, place, and time.  Skin: Skin is warm. Capillary refill takes less than 2 seconds.  Psychiatric: She has a normal mood and affect. Her behavior is normal. Judgment and thought content normal.  Nursing note and vitals reviewed.   Ortho Exam Left knee exam is stable.  No joint effusion. Specialty Comments:  MRI on 11/27/2015 left wrist: Severe de Quervain's tenosynovitis. Ulnar minus .Marland Kitchen. 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   S/p US guided injection of Wrist on: 12/16/15   Neuro Consult: Small fiber polyneuropathy.  Imaging: No results found.   PMFS History: Patient Active Problem List   Diagnosis Date Noted  . Morbid obesity (HCC) 12/24/2016  . Hyperlipidemia 12/23/2016  . Painful orthopaedic hardware (HCC) 03/24/2016  . Corns and callosities 03/24/2016  . PUD (peptic ulcer disease) 01/02/2016  . Onychomycosis 01/02/2016  . Seasonal allergies 11/19/2015  . Wrist pain, left 10/23/2015  . GERD (gastroesophageal reflux disease) 08/06/2015  . Asthma 08/06/2015  . Back pain 08/06/2015  . Migraine 08/06/2015  . Bipolar disorder (HCC) 07/25/2015  .  Neuropathy 07/08/2015  . Major depressive disorder, recurrent, severe without psychotic features (HCC)   . Alcohol use disorder, severe, dependence (HCC) 11/16/2014  . Cocaine use disorder, severe, dependence (HCC) 11/16/2014  . Acute calculous cholecystitis s/p lap chole 02/09/2014 02/09/2014  . HTN (hypertension) 07/25/2013  . Pain in vertebral column 05/05/2013  . Carpal tunnel syndrome 11/18/2012  . Numbness and tingling of both legs below knees  11/18/2012  . Lumbago 11/18/2012  . Anxiety state, unspecified 11/18/2012   Past Medical History:  Diagnosis Date  . Anxiety   . Arthritis    lower back  . Asthma   . Bipolar disorder (HCC)   . Carpal tunnel syndrome, bilateral   . Chronic back pain   . Depression   . Diverticulosis   . Fx. left wrist   . GERD (gastroesophageal reflux disease)   . Heart murmur    "born with"  . Hypertension   . IBS (irritable bowel syndrome)   . Internal hemorrhoids   . Muscle spasms of neck   . Neuropathy   . PUD (peptic ulcer disease)     Family History  Problem Relation Age of Onset  . Pancreatic cancer Mother   . Heart failure Father   . Diabetes Father   . Breast cancer Paternal Grandmother   . HIV/AIDS Brother   . Other Sister        Brain tumor  . Colon cancer Neg Hx     Past Surgical History:  Procedure Laterality Date  . CARPAL TUNNEL RELEASE Right 2003  . [name] TOE FUSION Bilateral 2008  . ROTATOR CUFF REPAIR Right 2011  . WRIST FRACTURE SURGERY Left    Social History   Occupational History  . Occupation: Cook    Comment: [employer]  Tobacco Use  . Smoking status: Light Tobacco Smoker  . Smokeless tobacco: Never Used  . Tobacco comment: "takes a puff sometimes"  Substance and Sexual Activity  . Alcohol use: No  . Drug use: No    Comment: last used two years ago  . Sexual activity: No

## 2017-07-05 ENCOUNTER — Ambulatory Visit: Payer: Self-pay | Attending: Family Medicine

## 2017-07-05 ENCOUNTER — Ambulatory Visit (INDEPENDENT_AMBULATORY_CARE_PROVIDER_SITE_OTHER): Payer: Self-pay | Admitting: Orthopaedic Surgery

## 2017-07-05 NOTE — Telephone Encounter (Signed)
Called patient. States she already spoke to Dr Roda ShuttersXu

## 2017-07-07 ENCOUNTER — Other Ambulatory Visit: Payer: Self-pay | Admitting: Family Medicine

## 2017-07-07 MED FILL — HYDROCHLOROTHIAZIDE 25 MG T: 25 | 30 days supply | Qty: 30 | Fill #2

## 2017-07-07 MED FILL — ?CETIRIZINE HCL 10 MG TABLE: 10 | 30 days supply | Qty: 30 | Fill #0

## 2017-07-07 MED FILL — DIVALPROEX SOD DR 500 MG TA: 500 | 30 days supply | Qty: 90 | Fill #0

## 2017-07-07 MED FILL — ?SUMATRIPTAN SUCC 50 MG TAB: 50 | 9 days supply | Qty: 9 | Fill #1

## 2017-07-07 MED FILL — TOPIRAMATE 100 MG TABLET: 100 | 30 days supply | Qty: 120 | Fill #2

## 2017-07-07 MED FILL — ?ATORVASTATIN 20 MG TABLET: 20 | 30 days supply | Qty: 30 | Fill #0

## 2017-07-07 MED FILL — traZODone HCL 150 MG TABS: 150 | 30 days supply | Qty: 60 | Fill #0

## 2017-07-07 MED FILL — METOPROLOL TARTRATE 100 MG: 100 | 30 days supply | Qty: 60 | Fill #2

## 2017-07-07 MED FILL — ?CLONIDINE HCL 0.1 MG TABL: 0.1 | 30 days supply | Qty: 30 | Fill #2

## 2017-07-07 MED FILL — ?HALOPERIDOL 5 MG TABLET: 5 | 30 days supply | Qty: 30 | Fill #0

## 2017-07-07 MED FILL — GABAPENTIN 800 MG TABLET: 800 | 30 days supply | Qty: 120 | Fill #1

## 2017-07-07 MED FILL — ?CYCLOBENZAPRINE 10 MG TABL: 10 | 30 days supply | Qty: 60 | Fill #2

## 2017-07-07 MED FILL — $DEXILANT DR 60 MG CAPSULE: 60 | 30 days supply | Qty: 30 | Fill #2

## 2017-07-20 ENCOUNTER — Other Ambulatory Visit: Payer: Self-pay | Admitting: Family Medicine

## 2017-07-20 DIAGNOSIS — M25561 Pain in right knee: Secondary | ICD-10-CM

## 2017-07-23 ENCOUNTER — Other Ambulatory Visit: Payer: Self-pay

## 2017-07-23 ENCOUNTER — Encounter (HOSPITAL_BASED_OUTPATIENT_CLINIC_OR_DEPARTMENT_OTHER): Payer: Self-pay | Admitting: *Deleted

## 2017-07-23 ENCOUNTER — Other Ambulatory Visit (INDEPENDENT_AMBULATORY_CARE_PROVIDER_SITE_OTHER): Payer: Self-pay | Admitting: Orthopaedic Surgery

## 2017-07-23 DIAGNOSIS — M2242 Chondromalacia patellae, left knee: Secondary | ICD-10-CM

## 2017-07-26 ENCOUNTER — Encounter: Payer: Self-pay | Admitting: Family Medicine

## 2017-07-26 ENCOUNTER — Ambulatory Visit: Payer: Self-pay | Attending: Family Medicine | Admitting: Family Medicine

## 2017-07-26 ENCOUNTER — Telehealth (INDEPENDENT_AMBULATORY_CARE_PROVIDER_SITE_OTHER): Payer: Self-pay | Admitting: Orthopaedic Surgery

## 2017-07-26 VITALS — BP 136/81 | HR 82 | Temp 97.6°F | Ht 67.0 in | Wt 232.0 lb

## 2017-07-26 DIAGNOSIS — M1990 Unspecified osteoarthritis, unspecified site: Secondary | ICD-10-CM | POA: Insufficient documentation

## 2017-07-26 DIAGNOSIS — G8929 Other chronic pain: Secondary | ICD-10-CM | POA: Insufficient documentation

## 2017-07-26 DIAGNOSIS — Z880 Allergy status to penicillin: Secondary | ICD-10-CM | POA: Insufficient documentation

## 2017-07-26 DIAGNOSIS — Z9049 Acquired absence of other specified parts of digestive tract: Secondary | ICD-10-CM | POA: Insufficient documentation

## 2017-07-26 DIAGNOSIS — G629 Polyneuropathy, unspecified: Secondary | ICD-10-CM | POA: Insufficient documentation

## 2017-07-26 DIAGNOSIS — J45909 Unspecified asthma, uncomplicated: Secondary | ICD-10-CM | POA: Insufficient documentation

## 2017-07-26 DIAGNOSIS — K219 Gastro-esophageal reflux disease without esophagitis: Secondary | ICD-10-CM | POA: Insufficient documentation

## 2017-07-26 DIAGNOSIS — I10 Essential (primary) hypertension: Secondary | ICD-10-CM | POA: Insufficient documentation

## 2017-07-26 DIAGNOSIS — E785 Hyperlipidemia, unspecified: Secondary | ICD-10-CM | POA: Insufficient documentation

## 2017-07-26 DIAGNOSIS — F419 Anxiety disorder, unspecified: Secondary | ICD-10-CM | POA: Insufficient documentation

## 2017-07-26 DIAGNOSIS — G43709 Chronic migraine without aura, not intractable, without status migrainosus: Secondary | ICD-10-CM | POA: Insufficient documentation

## 2017-07-26 DIAGNOSIS — M1712 Unilateral primary osteoarthritis, left knee: Secondary | ICD-10-CM | POA: Insufficient documentation

## 2017-07-26 DIAGNOSIS — R232 Flushing: Secondary | ICD-10-CM

## 2017-07-26 DIAGNOSIS — E7800 Pure hypercholesterolemia, unspecified: Secondary | ICD-10-CM

## 2017-07-26 DIAGNOSIS — K589 Irritable bowel syndrome without diarrhea: Secondary | ICD-10-CM | POA: Insufficient documentation

## 2017-07-26 DIAGNOSIS — M545 Low back pain: Secondary | ICD-10-CM | POA: Insufficient documentation

## 2017-07-26 DIAGNOSIS — Z79899 Other long term (current) drug therapy: Secondary | ICD-10-CM | POA: Insufficient documentation

## 2017-07-26 DIAGNOSIS — Z886 Allergy status to analgesic agent status: Secondary | ICD-10-CM | POA: Insufficient documentation

## 2017-07-26 DIAGNOSIS — F3162 Bipolar disorder, current episode mixed, moderate: Secondary | ICD-10-CM | POA: Insufficient documentation

## 2017-07-26 DIAGNOSIS — Z131 Encounter for screening for diabetes mellitus: Secondary | ICD-10-CM | POA: Insufficient documentation

## 2017-07-26 DIAGNOSIS — Z791 Long term (current) use of non-steroidal anti-inflammatories (NSAID): Secondary | ICD-10-CM | POA: Insufficient documentation

## 2017-07-26 DIAGNOSIS — Z8711 Personal history of peptic ulcer disease: Secondary | ICD-10-CM | POA: Insufficient documentation

## 2017-07-26 DIAGNOSIS — N951 Menopausal and female climacteric states: Secondary | ICD-10-CM | POA: Insufficient documentation

## 2017-07-26 MED ORDER — CYCLOBENZAPRINE HCL 10 MG PO TABS: 10.0000 mg | ORAL_TABLET | Freq: Two times a day (BID) | ORAL | 3 refills | Status: DC

## 2017-07-26 MED ORDER — METOPROLOL TARTRATE 100 MG PO TABS: 100.0000 mg | ORAL_TABLET | Freq: Two times a day (BID) | ORAL | 3 refills | Status: DC

## 2017-07-26 MED ORDER — TOPIRAMATE 100 MG PO TABS: 200.0000 mg | ORAL_TABLET | Freq: Two times a day (BID) | ORAL | 3 refills | Status: DC

## 2017-07-26 MED ORDER — HYDROCHLOROTHIAZIDE 25 MG PO TABS: 25.0000 mg | ORAL_TABLET | Freq: Every day | ORAL | 3 refills | Status: DC

## 2017-07-26 MED ORDER — ATORVASTATIN CALCIUM 20 MG PO TABS: 20.0000 mg | ORAL_TABLET | Freq: Every day | ORAL | 5 refills | Status: DC

## 2017-07-26 MED ORDER — CETIRIZINE HCL 10 MG PO TABS: 10.0000 mg | ORAL_TABLET | Freq: Every day | ORAL | 5 refills | Status: DC

## 2017-07-26 MED ORDER — SUMATRIPTAN SUCCINATE 50 MG PO TABS
ORAL_TABLET | ORAL | 3 refills | Status: DC
Start: 2017-07-26 — End: 2017-11-22

## 2017-07-26 MED ORDER — CLONIDINE HCL 0.1 MG PO TABS: 0.1000 mg | ORAL_TABLET | Freq: Every evening | ORAL | 3 refills | Status: DC | PRN

## 2017-07-26 MED FILL — ?SUMATRIPTAN SUCC 50 MG TAB: 50 | 3 days supply | Qty: 10 | Fill #0

## 2017-07-26 NOTE — Telephone Encounter (Signed)
Patient called stated cancelling surgery for Wednesday 07/28/17 need another date-patient not feeling good and was told by surgery center and they told her to call you. Please call patient to r/s  Thanks

## 2017-07-26 NOTE — Progress Notes (Signed)
Subjective:  Patient ID: Amanda [name], female    DOB: [birth year]  Age: 51 y.o. MRN: [id number]  CC: Menorrhagia   HPI Amanda [name]  is a 51 year old female with a history of hypertension, bipolar disorder, neuropathy, chronic low back pain, PUD/GERD, Migraine, carpal tunnel syndrome Who comes in today for a follow-up Visit.  For her neuropathy she has been on Lyrica and did have some improvement after the switch from gabapentin to Lyrica but today informs me her neuropathy still persist and is more pronounced in both legs.  She has left knee osteoarthritis for which she sees orthopedics- Dr Erlinda Hong and is scheduled for left knee arthroscopy with chondroplasty synovectomy later this week.  Her knee pain is worse with going up stairs.  Her reflux and peptic ulcer symptoms are controlled and she denies any flares. Migraines are controlled on her current dose of Topamax.  She goes to mental health at [facility] for management of her bipolar disorder and denies any suicidal ideations or intents or manic symptoms. Her weight is stable compared to her last visit but she is unhappy that she has been unable to lose weight.  She had previously gained significant weight while on Remeron which was recently discontinued by psych.  Past Medical History:  Diagnosis Date  . Anxiety   . Arthritis    lower back  . Asthma   . Bipolar disorder ([place])   . Carpal tunnel syndrome, bilateral   . Chronic back pain   . Depression   . Diverticulosis   . Fx. left wrist   . GERD (gastroesophageal reflux disease)   . Heart murmur    "born with"  . Hypertension   . IBS (irritable bowel syndrome)   . Internal hemorrhoids   . Muscle spasms of neck   . Neuropathy   . PUD (peptic ulcer disease)     Past Surgical History:  Procedure Laterality Date  . ARTHRODESIS METATARSALPHALANGEAL JOINT (MTPJ) Right 04/29/2016   Procedure: RIGHT GREAT TOE METATARSOPHALANGEAL JOINT (MTPJ) FUSION, HARDWARE REMOVAL;  Surgeon:  Leandrew Koyanagi, MD;  Location: [facility];  Service: Orthopedics;  Laterality: Right;  RIGHT GREAT TOE METATARSOPHALANGEAL JOINT (MTPJ) FUSION, HARDWARE REMOVAL  . CARPAL TUNNEL RELEASE Right 2003  . CHOLECYSTECTOMY N/A 02/09/2014   Procedure: LAPAROSCOPIC CHOLECYSTECTOMY WITH INTRAOPERATIVE CHOLANGIOGRAM;  Surgeon: Pedro Earls, MD;  Location: [facility];  Service: General;  Laterality: N/A;  . HAMMER TOE FUSION Bilateral 2008  . HARDWARE REMOVAL Right 04/29/2016   Procedure: HARDWARE REMOVAL;  Surgeon: Leandrew Koyanagi, MD;  Location: [facility];  Service: Orthopedics;  Laterality: Right;  HARDWARE REMOVAL  . ROTATOR CUFF REPAIR Right 2011  . WRIST FRACTURE SURGERY Left     Allergies  Allergen Reactions  . Penicillins Anaphylaxis    Has patient had a PCN reaction causing immediate rash, facial/tongue/throat swelling, SOB or lightheadedness with hypotension: Yes Has patient had a PCN reaction causing severe rash involving mucus membranes or skin necrosis: Yes Has patient had a PCN reaction that required hospitalization Yes Has patient had a PCN reaction occurring within the last 10 years: No-more than 10 years ago If all of the above answers are "NO", then may proceed with Cephalosporin use.   Diona Fanti [Aspirin] Rash     Outpatient Medications Prior to Visit  Medication Sig Dispense Refill  . albuterol (VENTOLIN HFA) 108 (90 Base) MCG/ACT inhaler Inhale 2 puffs into the lungs every 6 (six) hours as  needed for wheezing or shortness of breath. 54 g 3  . buPROPion (WELLBUTRIN SR) 150 MG 12 hr tablet Take 150 mg by mouth 2 (two) times daily.    . colestipol (COLESTID) 1 g tablet Take 1 tablet (1 g total) by mouth every morning. 30 tablet 4  . dexlansoprazole (DEXILANT) 60 MG capsule Take 1 capsule (60 mg total) by mouth daily. 90 capsule 3  . divalproex (DEPAKOTE) 500 MG DR tablet Take 500 mg by mouth 3 (three) times daily.    . haloperidol (HALDOL) 5 MG tablet Take 1  tablet by mouth daily.  0  . hydrOXYzine (ATARAX/VISTARIL) 50 MG tablet Take 1 tablet (50 mg total) by mouth 3 (three) times daily as needed for anxiety or itching. 90 tablet 2  . meloxicam (MOBIC) 7.5 MG tablet TAKE 1 TABLET BY MOUTH DAILY. 30 tablet 2  . olopatadine (PATANOL) 0.1 % ophthalmic solution PLACE 1 DROP INTO BOTH EYES 2 TIMES DAILY. 5 mL 0  . pregabalin (LYRICA) 75 MG capsule Take 1 capsule (75 mg total) by mouth 2 (two) times daily. 180 capsule 1  . traZODone (DESYREL) 300 MG tablet Take 1 tablet (300 mg total) by mouth at bedtime. 30 tablet 0  . atorvastatin (LIPITOR) 20 MG tablet TAKE 1 TABLET BY MOUTH DAILY 30 tablet 2  . cetirizine (ZYRTEC) 10 MG tablet TAKE 1 TABLET BY MOUTH DAILY. 30 tablet 2  . cloNIDine (CATAPRES) 0.1 MG tablet Take 1 tablet (0.1 mg total) by mouth at bedtime as needed. For hot flashes 30 tablet 3  . cyclobenzaprine (FLEXERIL) 10 MG tablet Take 1 tablet (10 mg total) by mouth 2 (two) times daily. prn 60 tablet 3  . hydrochlorothiazide (HYDRODIURIL) 25 MG tablet Take 1 tablet (25 mg total) by mouth daily. 30 tablet 3  . metoprolol tartrate (LOPRESSOR) 100 MG tablet Take 1 tablet (100 mg total) by mouth 2 (two) times daily. 60 tablet 3  . SUMAtriptan (IMITREX) 50 MG tablet TAKE 50 MG ORALLY AT THE ONSET OF A MIGRAINE. MAY REPEAT IN 2 HOURS IF HEADACHE PERSISTS OR RECURS. MAXIMUM DAILY DOSE 200 MG 10 tablet 3  . topiramate (TOPAMAX) 100 MG tablet Take 2 tablets (200 mg total) by mouth 2 (two) times daily. 120 tablet 3  . nystatin-triamcinolone ointment (MYCOLOG) Apply 1 application topically 2 (two) times daily. Apply to rash on face (Patient not taking: Reported on 07/26/2017) 30 g 0  . ranitidine (ZANTAC) 150 MG capsule Take 1 capsule (150 mg total) by mouth every evening. (Patient not taking: Reported on 07/26/2017) 30 capsule 3   Facility-Administered Medications Prior to Visit  Medication Dose Route Frequency Provider Last Rate Last Dose  . lidocaine (PF)  (XYLOCAINE) 1 % injection 0.3 mL  0.3 mL Other Once Magnus Sinning, MD        ROS Review of Systems  Constitutional: Negative for activity change, appetite change and fatigue.  HENT: Negative for congestion, sinus pressure and sore throat.   Eyes: Negative for visual disturbance.  Respiratory: Negative for cough, chest tightness, shortness of breath and wheezing.   Cardiovascular: Negative for chest pain and palpitations.  Gastrointestinal: Negative for abdominal distention, abdominal pain and constipation.  Endocrine: Negative for polydipsia.  Genitourinary: Negative for dysuria and frequency.  Musculoskeletal:       See hpi  Skin: Negative for rash.  Neurological: Positive for numbness. Negative for tremors and light-headedness.  Hematological: Does not bruise/bleed easily.  Psychiatric/Behavioral: Negative for agitation and behavioral problems.  Objective:  BP 136/81   Pulse 82   Temp 97.6 F (36.4 C) (Oral)   Ht '5\' 7"'$  (1.702 m)   Wt 232 lb (105.2 kg)   LMP 06/10/2017   SpO2 98%   BMI 36.34 kg/m   BP/Weight 07/26/2017 68/07/7516 0/0/1749  Systolic BP 449 - 675  Diastolic BP 81 - 84  Wt. (Lbs) 232 230 230.6  BMI 36.34 - 36.12  Some encounter information is confidential and restricted. Go to Review Flowsheets activity to see all data.      Physical Exam  Constitutional: She is oriented to person, place, and time. She appears well-developed and well-nourished.  Neck: No JVD present.  Cardiovascular: Normal rate, normal heart sounds and intact distal pulses.  No murmur heard. Pulmonary/Chest: Effort normal and breath sounds normal. She has no wheezes. She has no rales. She exhibits no tenderness.  Abdominal: Soft. Bowel sounds are normal. She exhibits no distension and no mass. There is no tenderness.  Musculoskeletal: Normal range of motion. She exhibits tenderness (TTP on ROM of L knee; +crepitus).  Neurological: She is alert and oriented to person, place, and  time.  Skin: Skin is warm and dry.  Psychiatric: She has a normal mood and affect.    CMP Latest Ref Rng & Units 12/18/2016 04/28/2016 01/29/2016  Glucose 65 - 99 mg/dL 119(H) 87 89  BUN 6 - 24 mg/dL '9 8 11  '$ Creatinine 0.57 - 1.00 mg/dL 0.83 0.86 1.04  Sodium 134 - 144 mmol/L 141 139 140  Potassium 3.5 - 5.2 mmol/L 4.1 3.9 4.2  Chloride 96 - 106 mmol/L 106 112(H) 104  CO2 18 - 29 mmol/L 18 20(L) 30  Calcium 8.7 - 10.2 mg/dL 9.6 8.8(L) 8.7  Total Protein 6.0 - 8.5 g/dL 6.8 - 6.0  Total Bilirubin 0.0 - 1.2 mg/dL <0.2 - 0.3  Alkaline Phos 39 - 117 IU/L 99 - 58  AST 0 - 40 IU/L 24 - 16  ALT 0 - 32 IU/L 42(H) - 16     Lipid Panel     Component Value Date/Time   CHOL 234 (H) 12/18/2016 1021   TRIG 144 12/18/2016 1021   HDL 62 12/18/2016 1021   CHOLHDL 3.8 12/18/2016 1021   CHOLHDL 2.9 11/27/2015 1038   VLDL 9 11/27/2015 1038   LDLCALC 143 (H) 12/18/2016 1021      Assessment & Plan:   1. Hot flashes Controlled - cloNIDine (CATAPRES) 0.1 MG tablet; Take 1 tablet (0.1 mg total) by mouth at bedtime as needed. For hot flashes  Dispense: 30 tablet; Refill: 3  2. Essential hypertension Controlled - hydrochlorothiazide (HYDRODIURIL) 25 MG tablet; Take 1 tablet (25 mg total) by mouth daily.  Dispense: 30 tablet; Refill: 3 - metoprolol tartrate (LOPRESSOR) 100 MG tablet; Take 1 tablet (100 mg total) by mouth 2 (two) times daily.  Dispense: 60 tablet; Refill: 3 - CMP14+EGFR; Future - Lipid panel; Future  3. Chronic migraine without aura without status migrainosus, not intractable Stable - SUMAtriptan (IMITREX) 50 MG tablet; TAKE 50 MG ORALLY AT THE ONSET OF A MIGRAINE. MAY REPEAT IN 2 HOURS IF HEADACHE PERSISTS OR RECURS. MAXIMUM DAILY DOSE 200 MG  Dispense: 10 tablet; Refill: 3 - topiramate (TOPAMAX) 100 MG tablet; Take 2 tablets (200 mg total) by mouth 2 (two) times daily.  Dispense: 120 tablet; Refill: 3  4. Screening for diabetes mellitus Due to high risk medications will screen  for diabetes - Hemoglobin A1c; Future  5. Gastroesophageal reflux disease without  esophagitis Controlled on Dexilant  6. Neuropathy Uncontrolled on current dose of Lyrica We have discussed addition of Cymbalta which she will discuss with her psychiatrist If symptoms persist we will increase her dose of Lyrica  7. Bipolar disorder, current episode mixed, moderate ([facility]) Currently managed by psych  8. Primary osteoarthritis of left knee Scheduled for left knee arthroscopy with opacity synovectomy  9.  Upper lipidemia Uncontrolled Low-cholesterol diet We will adjust dose of atorvastatin once lipid panel is obtained.  Meds ordered this encounter  Medications  . atorvastatin (LIPITOR) 20 MG tablet    Sig: Take 1 tablet (20 mg total) by mouth daily.    Dispense:  30 tablet    Refill:  5  . cetirizine (ZYRTEC) 10 MG tablet    Sig: Take 1 tablet (10 mg total) by mouth daily.    Dispense:  30 tablet    Refill:  5  . cloNIDine (CATAPRES) 0.1 MG tablet    Sig: Take 1 tablet (0.1 mg total) by mouth at bedtime as needed. For hot flashes    Dispense:  30 tablet    Refill:  3  . cyclobenzaprine (FLEXERIL) 10 MG tablet    Sig: Take 1 tablet (10 mg total) by mouth 2 (two) times daily. prn    Dispense:  60 tablet    Refill:  3  . hydrochlorothiazide (HYDRODIURIL) 25 MG tablet    Sig: Take 1 tablet (25 mg total) by mouth daily.    Dispense:  30 tablet    Refill:  3    Discontinue previous dose  . metoprolol tartrate (LOPRESSOR) 100 MG tablet    Sig: Take 1 tablet (100 mg total) by mouth 2 (two) times daily.    Dispense:  60 tablet    Refill:  3  . SUMAtriptan (IMITREX) 50 MG tablet    Sig: TAKE 50 MG ORALLY AT THE ONSET OF A MIGRAINE. MAY REPEAT IN 2 HOURS IF HEADACHE PERSISTS OR RECURS. MAXIMUM DAILY DOSE 200 MG    Dispense:  10 tablet    Refill:  3  . topiramate (TOPAMAX) 100 MG tablet    Sig: Take 2 tablets (200 mg total) by mouth 2 (two) times daily.    Dispense:  120 tablet     Refill:  3    Follow-up: Return in about 3 months (around 10/24/2017) for follow up of chronic medical conditions.   Arnoldo Morale MD

## 2017-07-26 NOTE — Patient Instructions (Signed)

## 2017-07-27 MED FILL — MELOXICAM 7.5 MG TABLET: 7.5 | 30 days supply | Qty: 30 | Fill #0

## 2017-07-27 NOTE — Telephone Encounter (Signed)
I called patient and rescheduled surgery. 

## 2017-07-30 ENCOUNTER — Ambulatory Visit: Payer: Self-pay | Admitting: Family Medicine

## 2017-08-09 ENCOUNTER — Other Ambulatory Visit: Payer: Self-pay | Admitting: Family Medicine

## 2017-08-09 DIAGNOSIS — R21 Rash and other nonspecific skin eruption: Secondary | ICD-10-CM

## 2017-08-09 MED FILL — TOPIRAMATE 100 MG TABLET: 100 | 30 days supply | Qty: 120 | Fill #3

## 2017-08-09 MED FILL — traZODone HCL 150 MG TABS: 150 | 30 days supply | Qty: 60 | Fill #1

## 2017-08-09 MED FILL — HYDROCHLOROTHIAZIDE 25 MG T: 25 | 30 days supply | Qty: 30 | Fill #3

## 2017-08-09 MED FILL — $DEXILANT DR 60 MG CAPSULE: 60 | 30 days supply | Qty: 30 | Fill #3

## 2017-08-09 MED FILL — ?ATORVASTATIN 20 MG TABLET: 20 | 30 days supply | Qty: 30 | Fill #1

## 2017-08-09 MED FILL — ?HALOPERIDOL 5 MG TABLET: 5 | 30 days supply | Qty: 30 | Fill #1

## 2017-08-09 MED FILL — GABAPENTIN 800 MG TABLET: 800 | 30 days supply | Qty: 120 | Fill #2

## 2017-08-09 MED FILL — DIVALPROEX SOD DR 500 MG TA: 500 | 30 days supply | Qty: 90 | Fill #1

## 2017-08-09 MED FILL — ?CLONIDINE HCL 0.1 MG TABL: 0.1 | 30 days supply | Qty: 30 | Fill #3

## 2017-08-09 MED FILL — BUPROPION SR 150 MG TABLET: 150 | 30 days supply | Qty: 30 | Fill #1

## 2017-08-09 MED FILL — CYCLOBENZAPRINE 10 MG TAB: 10 | 30 days supply | Qty: 60 | Fill #3

## 2017-08-09 MED FILL — METOPROLOL TARTRATE 100 MG: 100 | 30 days supply | Qty: 60 | Fill #3

## 2017-08-09 MED FILL — ?CETIRIZINE HCL 10 MG TABLE: 10 | 30 days supply | Qty: 30 | Fill #1

## 2017-08-13 ENCOUNTER — Inpatient Hospital Stay (INDEPENDENT_AMBULATORY_CARE_PROVIDER_SITE_OTHER): Payer: Self-pay | Admitting: Orthopaedic Surgery

## 2017-08-26 MED FILL — MELOXICAM 7.5 MG TABLET: 7.5 | 30 days supply | Qty: 30 | Fill #1

## 2017-08-30 MED FILL — hydrOXYzine HCL 50 MG TABS: 50 | 30 days supply | Qty: 90 | Fill #1

## 2017-09-08 ENCOUNTER — Ambulatory Visit (HOSPITAL_BASED_OUTPATIENT_CLINIC_OR_DEPARTMENT_OTHER): Admission: RE | Admit: 2017-09-08 | Payer: Self-pay | Source: Ambulatory Visit | Admitting: Orthopaedic Surgery

## 2017-09-08 SURGERY — ARTHROSCOPY, KNEE, WITH MEDIAL MENISCECTOMY
Anesthesia: General | Laterality: Left

## 2017-09-08 MED FILL — HYDROCHLOROTHIAZIDE 25 MG T: 25 | 30 days supply | Qty: 30 | Fill #0

## 2017-09-08 MED FILL — ?METOPROLOL 100 MG TABLET: 100 | 30 days supply | Qty: 60 | Fill #2

## 2017-09-08 MED FILL — ?CETIRIZINE HCL 10 MG TABLE: 10 | 30 days supply | Qty: 30 | Fill #2

## 2017-09-08 MED FILL — TOPIRAMATE 100 MG TABLET: 100 | 30 days supply | Qty: 120 | Fill #2

## 2017-09-08 MED FILL — BUPROPION SR 150 MG TABLET: 150 | 30 days supply | Qty: 30 | Fill #2

## 2017-09-08 MED FILL — traZODone HCL 150 MG TABS: 150 | 30 days supply | Qty: 60 | Fill #2

## 2017-09-08 MED FILL — DIVALPROEX SOD DR 500 MG TA: 500 | 30 days supply | Qty: 90 | Fill #2

## 2017-09-08 MED FILL — ?ATORVASTATIN 20MG TABLET: 20 | 30 days supply | Qty: 30 | Fill #2

## 2017-09-08 MED FILL — ?HALOPERIDOL 5 MG TABLET: 5 | 30 days supply | Qty: 30 | Fill #2

## 2017-09-08 MED FILL — $DEXILANT DR 60 MG CAPSULE: 60 | 30 days supply | Qty: 30 | Fill #4

## 2017-09-08 MED FILL — CYCLOBENZAPRINE 10 MG TAB: 10 | 30 days supply | Qty: 60 | Fill #0

## 2017-09-08 MED FILL — ?CLONIDINE HCL 0.1 MG TABL: 0.1 | 30 days supply | Qty: 30 | Fill #0

## 2017-09-08 MED FILL — $VENTOLIN HFA 18G INHALER: 108 (90 BAS | 20 days supply | Qty: 18 | Fill #0

## 2017-09-08 MED FILL — GABAPENTIN 800 MG TABLET: 800 | 30 days supply | Qty: 120 | Fill #0

## 2017-09-10 ENCOUNTER — Ambulatory Visit (INDEPENDENT_AMBULATORY_CARE_PROVIDER_SITE_OTHER): Payer: Self-pay | Admitting: Orthopaedic Surgery

## 2017-09-13 ENCOUNTER — Ambulatory Visit (INDEPENDENT_AMBULATORY_CARE_PROVIDER_SITE_OTHER): Payer: Self-pay | Admitting: Orthopaedic Surgery

## 2017-09-17 ENCOUNTER — Ambulatory Visit (INDEPENDENT_AMBULATORY_CARE_PROVIDER_SITE_OTHER): Payer: Self-pay | Admitting: Orthopaedic Surgery

## 2017-09-17 ENCOUNTER — Encounter (INDEPENDENT_AMBULATORY_CARE_PROVIDER_SITE_OTHER): Payer: Self-pay | Admitting: Orthopaedic Surgery

## 2017-09-17 DIAGNOSIS — M5416 Radiculopathy, lumbar region: Secondary | ICD-10-CM

## 2017-09-17 MED ORDER — TIZANIDINE HCL 4 MG PO TABS: 4.0000 mg | ORAL_TABLET | Freq: Four times a day (QID) | ORAL | 2 refills | Status: DC | PRN

## 2017-09-17 MED ORDER — PREDNISONE 10 MG (21) PO TBPK: ORAL_TABLET | ORAL | 0 refills | Status: DC

## 2017-09-17 MED ORDER — NAPROXEN 500 MG PO TABS: 500.0000 mg | ORAL_TABLET | Freq: Two times a day (BID) | ORAL | 3 refills | Status: DC

## 2017-09-17 MED FILL — predniSONE 10 MG TABS: 10 | 6 days supply | Qty: 21 | Fill #0

## 2017-09-17 MED FILL — NAPROXEN 500 MG TABLET: 500 | 15 days supply | Qty: 30 | Fill #0

## 2017-09-17 MED FILL — ?TIZANIDINE HCL 4MG TABLETS: 4 | 8 days supply | Qty: 30 | Fill #0

## 2017-09-17 NOTE — Progress Notes (Signed)
Office Visit Note   Patient: Amanda [name]           Date of Birth: 1966/06/23           MRN: [id number] Visit Date: 09/17/2017              Requested by: Hoy Register, MD [address] [place], [place] [address] PCP: Hoy Register, MD   Assessment & Plan: Visit Diagnoses:  1. Lumbar radiculopathy     Plan: Impression is 52 year old female with left leg lumbar radiculopathy.  Prednisone taper and Zanaflex and naproxen.  Questions encouraged and answered.  Follow-up as needed.  If not better will need to consider MRI.  Follow-Up Instructions: Return if symptoms worsen or fail to improve.   Orders:  No orders of the defined types were placed in this encounter.  Meds ordered this encounter  Medications  . predniSONE (STERAPRED UNI-PAK 21 TAB) 10 MG (21) TBPK tablet    Sig: Take as directed    Dispense:  21 tablet    Refill:  0  . tiZANidine (ZANAFLEX) 4 MG tablet    Sig: Take 1 tablet (4 mg total) by mouth every 6 (six) hours as needed for muscle spasms.    Dispense:  30 tablet    Refill:  2  . naproxen (NAPROSYN) 500 MG tablet    Sig: Take 1 tablet (500 mg total) by mouth 2 (two) times daily with a meal.    Dispense:  30 tablet    Refill:  3      Procedures: No procedures performed   Clinical Data: No additional findings.   Subjective: Chief Complaint  Patient presents with  . Right Foot - Pain  . Left Knee - Pain    Patient comes in for back and left leg pain.  She has trouble walking.  She is states constant radiation of pain down the left leg.  Causing her to be unstable with ambulation.    Review of Systems  Constitutional: Negative.   HENT: Negative.   Eyes: Negative.   Respiratory: Negative.   Cardiovascular: Negative.   Endocrine: Negative.   Musculoskeletal: Negative.   Neurological: Negative.   Hematological: Negative.   Psychiatric/Behavioral: Negative.   All other systems reviewed and are negative.    Objective: Vital  Signs: LMP 06/10/2017   Physical Exam  Constitutional: She is oriented to person, place, and time. She appears well-developed and well-nourished.  Pulmonary/Chest: Effort normal.  Neurological: She is alert and oriented to person, place, and time.  Skin: Skin is warm. Capillary refill takes less than 2 seconds.  Psychiatric: She has a normal mood and affect. Her behavior is normal. Judgment and thought content normal.  Nursing note and vitals reviewed.   Ortho Exam Left lower extremity exam shows negative straight leg.  No focal motor or sensory deficits.  Impression is lumbar radiculopathy.  Prescription for prednisone taper, Zanaflex, naproxen.  If not better will need to consider MRI. Specialty Comments:  MRI on 11/27/2015 left wrist: Severe de Quervain's tenosynovitis. Ulnar minus .Marland Kitchen Small enchondroma of the proximal pole of the scaphoid.   S/p US guided injection of Wrist on: 12/16/15   Neuro Consult: Small fiber polyneuropathy.  Imaging: No results found.   PMFS History: Patient Active Problem List   Diagnosis Date Noted  . Degenerative joint disease 07/26/2017  . Morbid obesity (HCC) 12/24/2016  . Hyperlipidemia 12/23/2016  . Painful orthopaedic hardware (HCC) 03/24/2016  . Corns and callosities  03/24/2016  . PUD (peptic ulcer disease) 01/02/2016  . Onychomycosis 01/02/2016  . Seasonal allergies 11/19/2015  . Wrist pain, left 10/23/2015  . GERD (gastroesophageal reflux disease) 08/06/2015  . Asthma 08/06/2015  . Back pain 08/06/2015  . Migraine 08/06/2015  . Bipolar disorder (HCC) 07/25/2015  . Neuropathy 07/08/2015  . Major depressive disorder, recurrent, severe without psychotic features (HCC)   . Alcohol use disorder, severe, dependence (HCC) 11/16/2014  . Cocaine use disorder, severe, dependence (HCC) 11/16/2014  . Acute calculous cholecystitis s/p lap chole 02/09/2014 02/09/2014  . HTN (hypertension) 07/25/2013  . Pain in vertebral column 05/05/2013  . Carpal  tunnel syndrome 11/18/2012  . Numbness and tingling of both legs below knees 11/18/2012  . Lumbago 11/18/2012  . Anxiety state, unspecified 11/18/2012   Past Medical History:  Diagnosis Date  . Anxiety   . Arthritis    lower back  . Asthma   . Bipolar disorder (HCC)   . Carpal tunnel syndrome, bilateral   . Chronic back pain   . Depression   . Diverticulosis   . Fx. left wrist   . GERD (gastroesophageal reflux disease)   . Heart murmur    "born with"  . Hypertension   . IBS (irritable bowel syndrome)   . Internal hemorrhoids   . Muscle spasms of neck   . Neuropathy   . PUD (peptic ulcer disease)     Family History  Problem Relation Age of Onset  . Pancreatic cancer Mother   . Heart failure Father   . Diabetes Father   . Breast cancer Paternal Grandmother   . HIV/AIDS Brother   . Other Sister        Brain tumor  . Colon cancer Neg Hx     Past Surgical History:  Procedure Laterality Date  . ARTHRODESIS METATARSALPHALANGEAL JOINT (MTPJ) Right 04/29/2016   Procedure: RIGHT GREAT TOE METATARSOPHALANGEAL JOINT (MTPJ) FUSION, HARDWARE REMOVAL;  Surgeon: Tarry KosNaiping M Lakima Dona, MD;  Location: [facility];  Service: Orthopedics;  Laterality: Right;  RIGHT GREAT TOE METATARSOPHALANGEAL JOINT (MTPJ) FUSION, HARDWARE REMOVAL  . CARPAL TUNNEL RELEASE Right 2003  . CHOLECYSTECTOMY N/A 02/09/2014   Procedure: LAPAROSCOPIC CHOLECYSTECTOMY WITH INTRAOPERATIVE CHOLANGIOGRAM;  Surgeon: Valarie MerinoMatthew B Martin, MD;  Location: [facility];  Service: General;  Laterality: N/A;  . HAMMER TOE FUSION Bilateral 2008  . HARDWARE REMOVAL Right 04/29/2016   Procedure: HARDWARE REMOVAL;  Surgeon: Tarry KosNaiping M Mehar Sagen, MD;  Location: [facility];  Service: Orthopedics;  Laterality: Right;  HARDWARE REMOVAL  . ROTATOR CUFF REPAIR Right 2011  . WRIST FRACTURE SURGERY Left    Social History   Occupational History  . Occupation: Cook    Comment: [employer]  Tobacco Use  . Smoking status: Light Tobacco  Smoker  . Smokeless tobacco: Never Used  . Tobacco comment: "takes a puff sometimes"  Substance and Sexual Activity  . Alcohol use: No  . Drug use: No    Comment: last used two years ago  . Sexual activity: No

## 2017-09-20 ENCOUNTER — Ambulatory Visit: Payer: Self-pay | Attending: Family Medicine

## 2017-09-20 ENCOUNTER — Ambulatory Visit: Payer: Self-pay

## 2017-09-21 ENCOUNTER — Inpatient Hospital Stay (INDEPENDENT_AMBULATORY_CARE_PROVIDER_SITE_OTHER): Payer: Self-pay | Admitting: Orthopaedic Surgery

## 2017-09-28 MED FILL — BUPROPION SR 150 MG TABLET: 150 | 30 days supply | Qty: 60 | Fill #0

## 2017-09-30 ENCOUNTER — Other Ambulatory Visit: Payer: Self-pay | Admitting: Family Medicine

## 2017-09-30 ENCOUNTER — Ambulatory Visit: Payer: Self-pay | Attending: Family Medicine

## 2017-09-30 DIAGNOSIS — Z1321 Encounter for screening for nutritional disorder: Secondary | ICD-10-CM | POA: Insufficient documentation

## 2017-09-30 DIAGNOSIS — Z131 Encounter for screening for diabetes mellitus: Secondary | ICD-10-CM | POA: Insufficient documentation

## 2017-09-30 DIAGNOSIS — I10 Essential (primary) hypertension: Secondary | ICD-10-CM | POA: Insufficient documentation

## 2017-09-30 NOTE — Progress Notes (Signed)
Patient here for lab visit  

## 2017-10-01 ENCOUNTER — Other Ambulatory Visit: Payer: Self-pay | Admitting: Family Medicine

## 2017-10-01 LAB — CMP14+EGFR
ALT: 32 IU/L (ref 0–32)
AST: 26 IU/L (ref 0–40)
Albumin/Globulin Ratio: 1.7 (ref 1.2–2.2)
Albumin: 4.5 g/dL (ref 3.5–5.5)
Alkaline Phosphatase: 125 IU/L — ABNORMAL HIGH (ref 39–117)
BUN/Creatinine Ratio: 9 (ref 9–23)
BUN: 10 mg/dL (ref 6–24)
Bilirubin Total: 0.5 mg/dL (ref 0.0–1.2)
CO2: 20 mmol/L (ref 20–29)
Calcium: 9.3 mg/dL (ref 8.7–10.2)
Chloride: 106 mmol/L (ref 96–106)
Creatinine, Ser: 1.13 mg/dL — ABNORMAL HIGH (ref 0.57–1.00)
GFR calc Af Amer: 65 mL/min/{1.73_m2} (ref >59)
GFR calc non Af Amer: 56 mL/min/{1.73_m2} — ABNORMAL LOW (ref >59)
Globulin, Total: 2.7 g/dL (ref 1.5–4.5)
Glucose: 108 mg/dL — ABNORMAL HIGH (ref 65–99)
Potassium: 4.1 mmol/L (ref 3.5–5.2)
Sodium: 142 mmol/L (ref 134–144)
Total Protein: 7.2 g/dL (ref 6.0–8.5)

## 2017-10-01 LAB — LIPID PANEL
Chol/HDL Ratio: 3.9 ratio (ref 0.0–4.4)
Cholesterol, Total: 205 mg/dL — ABNORMAL HIGH (ref 100–199)
HDL: 52 mg/dL (ref >39)
LDL Calculated: 121 mg/dL — ABNORMAL HIGH (ref 0–99)
Triglycerides: 161 mg/dL — ABNORMAL HIGH (ref 0–149)
VLDL Cholesterol Cal: 32 mg/dL (ref 5–40)

## 2017-10-01 LAB — HEMOGLOBIN A1C
Est. average glucose Bld gHb Est-mCnc: 100 mg/dL
Hgb A1c MFr Bld: 5.1 % (ref 4.8–5.6)

## 2017-10-01 LAB — VITAMIN D 25 HYDROXY (VIT D DEFICIENCY, FRACTURES): Vit D, 25-Hydroxy: 12 ng/mL — ABNORMAL LOW (ref 30.0–100.0)

## 2017-10-01 MED ORDER — ERGOCALCIFEROL 1.25 MG (50000 UT) PO CAPS: 50000.0000 [IU] | ORAL_CAPSULE | ORAL | 1 refills | Status: DC

## 2017-10-01 MED FILL — VIT D2 1.25 MG (50,000 UNIT: 1.25 MG | 28 days supply | Qty: 4 | Fill #0

## 2017-10-07 ENCOUNTER — Telehealth: Payer: Self-pay

## 2017-10-07 ENCOUNTER — Other Ambulatory Visit: Payer: Self-pay | Admitting: Family Medicine

## 2017-10-07 DIAGNOSIS — J302 Other seasonal allergic rhinitis: Secondary | ICD-10-CM

## 2017-10-07 NOTE — Telephone Encounter (Signed)
Patient was called and informed of lab results. 

## 2017-10-11 MED FILL — OLOPATADINE HCL 0.1% EYE DR: 0.1 | 30 days supply | Qty: 5 | Fill #0

## 2017-10-11 MED FILL — ?CETIRIZINE HCL 10 MG TABLE: 10 | 30 days supply | Qty: 30 | Fill #0

## 2017-10-18 MED FILL — $DEXILANT DR 60 MG CAPSULE: 60 | 30 days supply | Qty: 30 | Fill #5

## 2017-10-22 MED FILL — HALOPERIDOL 5 MG TABLET: 5 | 30 days supply | Qty: 30 | Fill #0

## 2017-10-22 MED FILL — MELOXICAM 7.5 MG TABLET: 7.5 | 30 days supply | Qty: 30 | Fill #2

## 2017-10-22 MED FILL — ?METOPROLOL 100 MG TABLET: 100 | 30 days supply | Qty: 60 | Fill #0

## 2017-10-22 MED FILL — ?SUMATRIPTAN SUCC 50MG TAB: 50 | 3 days supply | Qty: 10 | Fill #1

## 2017-10-22 MED FILL — DIVALPROEX SOD 500 MG TAB D: 500 | 30 days supply | Qty: 90 | Fill #0

## 2017-10-22 MED FILL — GABAPENTIN 800 MG TABLET: 800 | 30 days supply | Qty: 120 | Fill #1

## 2017-10-22 MED FILL — TOPIRAMATE 100 MG TABLET: 100 | 30 days supply | Qty: 120 | Fill #3

## 2017-10-22 MED FILL — traZODone HCL 150 MG TABS: 150 | 30 days supply | Qty: 60 | Fill #0

## 2017-10-22 MED FILL — CYCLOBENZAPRINE 10 MG TAB: 10 | 30 days supply | Qty: 60 | Fill #1

## 2017-10-22 MED FILL — hydrOXYzine HCL 50 MG TABS: 50 | 30 days supply | Qty: 90 | Fill #2

## 2017-11-01 ENCOUNTER — Ambulatory Visit: Payer: Self-pay | Admitting: Family Medicine

## 2017-11-15 MED FILL — ?CETIRIZINE HCL 10 MG TABLE: 10 | 30 days supply | Qty: 30 | Fill #1

## 2017-11-15 MED FILL — BUPROPION SR 150 MG TABLET: 150 | 30 days supply | Qty: 60 | Fill #1

## 2017-11-15 MED FILL — ?TIZANIDINE HCL 4MG TABLETS: 4 | 8 days supply | Qty: 30 | Fill #1

## 2017-11-15 MED FILL — VIT D2 1.25 MG (50,000 UNIT: 1.25 MG | 28 days supply | Qty: 4 | Fill #1

## 2017-11-15 MED FILL — NAPROXEN 500 MG TABLET: 500 | 15 days supply | Qty: 30 | Fill #1

## 2017-11-15 MED FILL — $DEXILANT DR 60 MG CAPSULE: 60 | 30 days supply | Qty: 30 | Fill #6

## 2017-11-22 ENCOUNTER — Ambulatory Visit: Payer: Self-pay | Attending: Family Medicine | Admitting: Family Medicine

## 2017-11-22 ENCOUNTER — Other Ambulatory Visit: Payer: Self-pay | Admitting: Family Medicine

## 2017-11-22 ENCOUNTER — Encounter: Payer: Self-pay | Admitting: Family Medicine

## 2017-11-22 VITALS — BP 138/82 | HR 73 | Temp 97.9°F | Ht 67.0 in | Wt 225.2 lb

## 2017-11-22 DIAGNOSIS — K219 Gastro-esophageal reflux disease without esophagitis: Secondary | ICD-10-CM | POA: Insufficient documentation

## 2017-11-22 DIAGNOSIS — M1712 Unilateral primary osteoarthritis, left knee: Secondary | ICD-10-CM | POA: Insufficient documentation

## 2017-11-22 DIAGNOSIS — M25561 Pain in right knee: Secondary | ICD-10-CM

## 2017-11-22 DIAGNOSIS — F419 Anxiety disorder, unspecified: Secondary | ICD-10-CM | POA: Insufficient documentation

## 2017-11-22 DIAGNOSIS — N951 Menopausal and female climacteric states: Secondary | ICD-10-CM | POA: Insufficient documentation

## 2017-11-22 DIAGNOSIS — Z880 Allergy status to penicillin: Secondary | ICD-10-CM | POA: Insufficient documentation

## 2017-11-22 DIAGNOSIS — E7800 Pure hypercholesterolemia, unspecified: Secondary | ICD-10-CM

## 2017-11-22 DIAGNOSIS — R232 Flushing: Secondary | ICD-10-CM

## 2017-11-22 DIAGNOSIS — F332 Major depressive disorder, recurrent severe without psychotic features: Secondary | ICD-10-CM

## 2017-11-22 DIAGNOSIS — M545 Low back pain: Secondary | ICD-10-CM | POA: Insufficient documentation

## 2017-11-22 DIAGNOSIS — M5416 Radiculopathy, lumbar region: Secondary | ICD-10-CM

## 2017-11-22 DIAGNOSIS — G5603 Carpal tunnel syndrome, bilateral upper limbs: Secondary | ICD-10-CM | POA: Insufficient documentation

## 2017-11-22 DIAGNOSIS — G629 Polyneuropathy, unspecified: Secondary | ICD-10-CM | POA: Insufficient documentation

## 2017-11-22 DIAGNOSIS — G8929 Other chronic pain: Secondary | ICD-10-CM | POA: Insufficient documentation

## 2017-11-22 DIAGNOSIS — G43709 Chronic migraine without aura, not intractable, without status migrainosus: Secondary | ICD-10-CM

## 2017-11-22 DIAGNOSIS — Z886 Allergy status to analgesic agent status: Secondary | ICD-10-CM | POA: Insufficient documentation

## 2017-11-22 DIAGNOSIS — K589 Irritable bowel syndrome without diarrhea: Secondary | ICD-10-CM | POA: Insufficient documentation

## 2017-11-22 DIAGNOSIS — J329 Chronic sinusitis, unspecified: Secondary | ICD-10-CM | POA: Insufficient documentation

## 2017-11-22 DIAGNOSIS — F3162 Bipolar disorder, current episode mixed, moderate: Secondary | ICD-10-CM

## 2017-11-22 DIAGNOSIS — Z9049 Acquired absence of other specified parts of digestive tract: Secondary | ICD-10-CM | POA: Insufficient documentation

## 2017-11-22 DIAGNOSIS — K279 Peptic ulcer, site unspecified, unspecified as acute or chronic, without hemorrhage or perforation: Secondary | ICD-10-CM

## 2017-11-22 DIAGNOSIS — I10 Essential (primary) hypertension: Secondary | ICD-10-CM

## 2017-11-22 DIAGNOSIS — J328 Other chronic sinusitis: Secondary | ICD-10-CM

## 2017-11-22 DIAGNOSIS — J45909 Unspecified asthma, uncomplicated: Secondary | ICD-10-CM | POA: Insufficient documentation

## 2017-11-22 DIAGNOSIS — Z791 Long term (current) use of non-steroidal anti-inflammatories (NSAID): Secondary | ICD-10-CM | POA: Insufficient documentation

## 2017-11-22 DIAGNOSIS — Z79899 Other long term (current) drug therapy: Secondary | ICD-10-CM | POA: Insufficient documentation

## 2017-11-22 MED ORDER — CLONIDINE HCL 0.1 MG PO TABS: 0.1000 mg | ORAL_TABLET | Freq: Every evening | ORAL | 3 refills | Status: DC | PRN

## 2017-11-22 MED ORDER — SUMATRIPTAN SUCCINATE 50 MG PO TABS: ORAL_TABLET | ORAL | 3 refills | Status: DC

## 2017-11-22 MED ORDER — FLUTICASONE PROPIONATE 50 MCG/ACT NA SUSP: 2.0000 | Freq: Every day | NASAL | 1 refills | Status: DC

## 2017-11-22 MED ORDER — CYCLOBENZAPRINE HCL 10 MG PO TABS: 10.0000 mg | ORAL_TABLET | Freq: Two times a day (BID) | ORAL | 3 refills | Status: DC

## 2017-11-22 MED ORDER — TOPIRAMATE 100 MG PO TABS: 200.0000 mg | ORAL_TABLET | Freq: Two times a day (BID) | ORAL | 3 refills | Status: DC

## 2017-11-22 MED ORDER — ATORVASTATIN CALCIUM 20 MG PO TABS: 20.0000 mg | ORAL_TABLET | Freq: Every day | ORAL | 5 refills | Status: DC

## 2017-11-22 MED ORDER — METOPROLOL TARTRATE 100 MG PO TABS: 100.0000 mg | ORAL_TABLET | Freq: Two times a day (BID) | ORAL | 3 refills | Status: DC

## 2017-11-22 MED ORDER — CETIRIZINE HCL 10 MG PO TABS: 10.0000 mg | ORAL_TABLET | Freq: Every day | ORAL | 5 refills | Status: DC

## 2017-11-22 MED ORDER — HYDROCHLOROTHIAZIDE 25 MG PO TABS: 25.0000 mg | ORAL_TABLET | Freq: Every day | ORAL | 3 refills | Status: DC

## 2017-11-22 MED FILL — TOPIRAMATE 100 MG TABLET: 100 | 30 days supply | Qty: 120 | Fill #0

## 2017-11-22 MED FILL — ?SUMATRIPTAN SUCC 50MG TAB: 50 | 30 days supply | Qty: 9 | Fill #0

## 2017-11-22 MED FILL — ?METOPROLOL 100 MG TABLET: 100 | 30 days supply | Qty: 60 | Fill #0

## 2017-11-22 MED FILL — FLUTICASONE PROP 50 MCG SPR: 50 | 30 days supply | Qty: 16 | Fill #0

## 2017-11-22 MED FILL — HYDROCHLOROTHIAZIDE 25 MG T: 25 | 30 days supply | Qty: 30 | Fill #0

## 2017-11-22 MED FILL — ?CLONIDINE HCL 0.1 MG TABL: 0.1 | 30 days supply | Qty: 30 | Fill #0

## 2017-11-22 MED FILL — ?ATORVASTATIN 20 MG TABLET: 20 | 30 days supply | Qty: 30 | Fill #0

## 2017-11-22 MED FILL — CYCLOBENZAPRINE 10 MG TAB: 10 | 30 days supply | Qty: 60 | Fill #0

## 2017-11-22 NOTE — Progress Notes (Signed)
Subjective:  Patient ID: Amanda [name], female    DOB: 1966-08-24  Age: 52 y.o. MRN: [id number]  CC: Hypertension and Headache   HPI Amanda [name] is a 52 year old female with a history of hypertension, bipolar disorder, neuropathy, chronic low back pain, PUD/GERD, Migraine, carpal tunnel syndrome Who comes in today for a follow-up Visit. He has lost 7 pounds since her last visit 3 months ago and attributes this to reducing her portion sizes and increasing intake of water.  She has lumbar radiculopathy and neuropathy which is stable on her current dose of Lyrica. She has left knee osteoarthritis for which she sees orthopedics- Dr Roda ShuttersXu and had aspiration of her left knee due to pain as she had postponed the left knee arthroscopy with chondroplasty synovectomy scheduled for 08/2017 due to anxiety about the procedure. Her reflux and peptic ulcer symptoms are controlled and she denies any flares.  Over the last 3 weeks she developed headaches on both sides of her temples and is unsure if this is her migraines.  Also noticed pain in both cheek and has had rhinorrhea mostly from her left nostril. She currently takes Zyrtec She [facility] management of her bipolar disorder and denies any suicidal ideations or intents or manic symptoms.    Past Medical History:  Diagnosis Date  . Anxiety   . Arthritis    lower back  . Asthma   . Bipolar disorder (HCC)   . Carpal tunnel syndrome, bilateral   . Chronic back pain   . Depression   . Diverticulosis   . Fx. left wrist   . GERD (gastroesophageal reflux disease)   . Heart murmur    "born with"  . Hypertension   . IBS (irritable bowel syndrome)   . Internal hemorrhoids   . Muscle spasms of neck   . Neuropathy   . PUD (peptic ulcer disease)     Past Surgical History:  Procedure Laterality Date  . ARTHRODESIS METATARSALPHALANGEAL JOINT (MTPJ) Right 04/29/2016   Procedure: RIGHT GREAT TOE METATARSOPHALANGEAL JOINT  (MTPJ) FUSION, HARDWARE REMOVAL;  Surgeon: Tarry KosNaiping M Xu, MD;  Location: [facility];  Service: Orthopedics;  Laterality: Right;  RIGHT GREAT TOE METATARSOPHALANGEAL JOINT (MTPJ) FUSION, HARDWARE REMOVAL  . CARPAL TUNNEL RELEASE Right 2003  . CHOLECYSTECTOMY N/A 02/09/2014   Procedure: LAPAROSCOPIC CHOLECYSTECTOMY WITH INTRAOPERATIVE CHOLANGIOGRAM;  Surgeon: Valarie MerinoMatthew B Martin, MD;  Location: [facility];  Service: General;  Laterality: N/A;  . HAMMER TOE FUSION Bilateral 2008  . HARDWARE REMOVAL Right 04/29/2016   Procedure: HARDWARE REMOVAL;  Surgeon: Tarry KosNaiping M Xu, MD;  Location: [facility];  Service: Orthopedics;  Laterality: Right;  HARDWARE REMOVAL  . ROTATOR CUFF REPAIR Right 2011  . WRIST FRACTURE SURGERY Left     Allergies  Allergen Reactions  . Penicillins Anaphylaxis    Has patient had a PCN reaction causing immediate rash, facial/tongue/throat swelling, SOB or lightheadedness with hypotension: Yes Has patient had a PCN reaction causing severe rash involving mucus membranes or skin necrosis: Yes Has patient had a PCN reaction that required hospitalization Yes Has patient had a PCN reaction occurring within the last 10 years: No-more than 10 years ago If all of the above answers are "NO", then may proceed with Cephalosporin use.   Jonne Ply. Asa [Aspirin] Rash     Outpatient Medications Prior to Visit  Medication Sig Dispense Refill  . albuterol (VENTOLIN HFA) 108 (90 Base) MCG/ACT inhaler Inhale 2  puffs into the lungs every 6 (six) hours as needed for wheezing or shortness of breath. 54 g 3  . dexlansoprazole (DEXILANT) 60 MG capsule Take 1 capsule (60 mg total) by mouth daily. 90 capsule 3  . divalproex (DEPAKOTE) 500 MG DR tablet Take 500 mg by mouth 3 (three) times daily.    . ergocalciferol (DRISDOL) 50000 units capsule Take 1 capsule (50,000 Units total) by mouth once a week. 9 capsule 1  . haloperidol (HALDOL) 5 MG tablet Take 1 tablet by mouth daily.  0  .  hydrOXYzine (ATARAX/VISTARIL) 50 MG tablet Take 1 tablet (50 mg total) by mouth 3 (three) times daily as needed for anxiety or itching. 90 tablet 2  . meloxicam (MOBIC) 7.5 MG tablet TAKE 1 TABLET BY MOUTH DAILY. 30 tablet 2  . naproxen (NAPROSYN) 500 MG tablet Take 1 tablet (500 mg total) by mouth 2 (two) times daily with a meal. 30 tablet 3  . olopatadine (PATANOL) 0.1 % ophthalmic solution PLACE 1 DROP INTO BOTH EYES 2 TIMES DAILY. 5 mL 0  . predniSONE (STERAPRED UNI-PAK 21 TAB) 10 MG (21) TBPK tablet Take as directed 21 tablet 0  . pregabalin (LYRICA) 75 MG capsule Take 1 capsule (75 mg total) by mouth 2 (two) times daily. 180 capsule 1  . tiZANidine (ZANAFLEX) 4 MG tablet Take 1 tablet (4 mg total) by mouth every 6 (six) hours as needed for muscle spasms. 30 tablet 2  . traZODone (DESYREL) 300 MG tablet Take 1 tablet (300 mg total) by mouth at bedtime. 30 tablet 0  . atorvastatin (LIPITOR) 20 MG tablet Take 1 tablet (20 mg total) by mouth daily. 30 tablet 5  . cetirizine (ZYRTEC) 10 MG tablet Take 1 tablet (10 mg total) by mouth daily. 30 tablet 5  . cloNIDine (CATAPRES) 0.1 MG tablet Take 1 tablet (0.1 mg total) by mouth at bedtime as needed. For hot flashes 30 tablet 3  . cyclobenzaprine (FLEXERIL) 10 MG tablet Take 1 tablet (10 mg total) by mouth 2 (two) times daily. prn 60 tablet 3  . hydrochlorothiazide (HYDRODIURIL) 25 MG tablet Take 1 tablet (25 mg total) by mouth daily. 30 tablet 3  . metoprolol tartrate (LOPRESSOR) 100 MG tablet Take 1 tablet (100 mg total) by mouth 2 (two) times daily. 60 tablet 3  . SUMAtriptan (IMITREX) 50 MG tablet TAKE 50 MG ORALLY AT THE ONSET OF A MIGRAINE. MAY REPEAT IN 2 HOURS IF HEADACHE PERSISTS OR RECURS. MAXIMUM DAILY DOSE 200 MG 10 tablet 3  . topiramate (TOPAMAX) 100 MG tablet Take 2 tablets (200 mg total) by mouth 2 (two) times daily. 120 tablet 3  . buPROPion (WELLBUTRIN SR) 150 MG 12 hr tablet Take 150 mg by mouth 2 (two) times daily.    . colestipol  (COLESTID) 1 g tablet Take 1 tablet (1 g total) by mouth every morning. (Patient not taking: Reported on 11/22/2017) 30 tablet 4   Facility-Administered Medications Prior to Visit  Medication Dose Route Frequency Provider Last Rate Last Dose  . lidocaine (PF) (XYLOCAINE) 1 % injection 0.3 mL  0.3 mL Other Once Tyrell Antonio, MD        ROS Review of Systems  Constitutional: Negative for activity change, appetite change and fatigue.  HENT: Negative for congestion, sinus pressure and sore throat.   Eyes: Negative for visual disturbance.  Respiratory: Negative for cough, chest tightness, shortness of breath and wheezing.   Cardiovascular: Negative for chest pain and palpitations.  Gastrointestinal: Negative  for abdominal distention, abdominal pain and constipation.  Endocrine: Negative for polydipsia.  Genitourinary: Negative for dysuria and frequency.  Musculoskeletal: Positive for back pain. Negative for arthralgias.  Skin: Negative for rash.  Neurological: Positive for numbness and headaches. Negative for tremors and light-headedness.  Hematological: Does not bruise/bleed easily.  Psychiatric/Behavioral: Negative for agitation and behavioral problems.    Objective:  BP 138/82   Pulse 73   Temp 97.9 F (36.6 C) (Oral)   Ht 5\' 7"  (1.702 m)   Wt 225 lb 3.2 oz (102.2 kg)   LMP 06/10/2017   SpO2 100%   BMI 35.27 kg/m   BP/Weight 11/22/2017 09/08/2017 07/26/2017  Systolic BP 138 - 136  Diastolic BP 82 - 81  Wt. (Lbs) 225.2 - 232  BMI 35.27 36.02 36.34  Some encounter information is confidential and restricted. Go to Review Flowsheets activity to see all data.      Physical Exam  Constitutional: She is oriented to person, place, and time. She appears well-developed and well-nourished.  HENT:  Mouth/Throat: Oropharynx is clear and moist.  Bilateral maxillary sinus tenderness  Cardiovascular: Normal rate and intact distal pulses.  Murmur (2/6 systolic murmur)  heard. Pulmonary/Chest: Effort normal and breath sounds normal. She has no wheezes. She has no rales. She exhibits no tenderness.  Abdominal: Soft. Bowel sounds are normal. She exhibits no distension and no mass. There is no tenderness.  Musculoskeletal: Normal range of motion.  Neurological: She is alert and oriented to person, place, and time.  Skin: Skin is warm and dry.  Psychiatric: She has a normal mood and affect.    CMP Latest Ref Rng & Units 09/30/2017 12/18/2016 04/28/2016  Glucose 65 - 99 mg/dL 161(W) 960(A) 87  BUN 6 - 24 mg/dL 10 9 8   Creatinine 0.57 - 1.00 mg/dL 5.40(J) 8.11 9.14  Sodium 134 - 144 mmol/L 142 141 139  Potassium 3.5 - 5.2 mmol/L 4.1 4.1 3.9  Chloride 96 - 106 mmol/L 106 106 112(H)  CO2 20 - 29 mmol/L 20 18 20(L)  Calcium 8.7 - 10.2 mg/dL 9.3 9.6 7.8(G)  Total Protein 6.0 - 8.5 g/dL 7.2 6.8 -  Total Bilirubin 0.0 - 1.2 mg/dL 0.5 <9.5 -  Alkaline Phos 39 - 117 IU/L 125(H) 99 -  AST 0 - 40 IU/L 26 24 -  ALT 0 - 32 IU/L 32 42(H) -    Lipid Panel     Component Value Date/Time   CHOL 205 (H) 09/30/2017 1104   TRIG 161 (H) 09/30/2017 1104   HDL 52 09/30/2017 1104   CHOLHDL 3.9 09/30/2017 1104   CHOLHDL 2.9 11/27/2015 1038   VLDL 9 11/27/2015 1038   LDLCALC 121 (H) 09/30/2017 1104     Assessment & Plan:   1. Pure hypercholesterolemia Improved Continue statin - atorvastatin (LIPITOR) 20 MG tablet; Take 1 tablet (20 mg total) by mouth daily.  Dispense: 30 tablet; Refill: 5  2. Essential hypertension Controlled - hydrochlorothiazide (HYDRODIURIL) 25 MG tablet; Take 1 tablet (25 mg total) by mouth daily.  Dispense: 30 tablet; Refill: 3 - metoprolol tartrate (LOPRESSOR) 100 MG tablet; Take 1 tablet (100 mg total) by mouth 2 (two) times daily.  Dispense: 60 tablet; Refill: 3  3. Chronic migraine without aura without status migrainosus, not intractable Stable controlled - SUMAtriptan (IMITREX) 50 MG tablet; TAKE 50 MG ORALLY AT THE ONSET OF A MIGRAINE. MAY  REPEAT IN 2 HOURS IF HEADACHE PERSISTS OR RECURS. MAXIMUM DAILY DOSE 200 MG  Dispense: 10 tablet;  Refill: 3 - topiramate (TOPAMAX) 100 MG tablet; Take 2 tablets (200 mg total) by mouth 2 (two) times daily.  Dispense: 120 tablet; Refill: 3  4. PUD (peptic ulcer disease) Controlled  5. Major depressive disorder, recurrent, severe without psychotic features (HCC) Stable Continue trazodone  6. Bipolar disorder, current episode mixed, moderate (HCC) Currently on Haldol Management as per mental health  7. Hot flashes Stable - cloNIDine (CATAPRES) 0.1 MG tablet; Take 1 tablet (0.1 mg total) by mouth at bedtime as needed. For hot flashes  Dispense: 30 tablet; Refill: 3  8. Lumbar radiculopathy Lumbar spine surgery pending Follow-up with orthopedics - cyclobenzaprine (FLEXERIL) 10 MG tablet; Take 1 tablet (10 mg total) by mouth 2 (two) times daily. prn  Dispense: 60 tablet; Refill: 3  9. Other chronic sinusitis Could explain headaches We will commence Flonase - fluticasone (FLONASE) 50 MCG/ACT nasal spray; Place 2 sprays into both nostrils daily.  Dispense: 16 g; Refill: 1 - cetirizine (ZYRTEC) 10 MG tablet; Take 1 tablet (10 mg total) by mouth daily.  Dispense: 30 tablet; Refill: 5   Meds ordered this encounter  Medications  . fluticasone (FLONASE) 50 MCG/ACT nasal spray    Sig: Place 2 sprays into both nostrils daily.    Dispense:  16 g    Refill:  1  . cyclobenzaprine (FLEXERIL) 10 MG tablet    Sig: Take 1 tablet (10 mg total) by mouth 2 (two) times daily. prn    Dispense:  60 tablet    Refill:  3  . atorvastatin (LIPITOR) 20 MG tablet    Sig: Take 1 tablet (20 mg total) by mouth daily.    Dispense:  30 tablet    Refill:  5  . hydrochlorothiazide (HYDRODIURIL) 25 MG tablet    Sig: Take 1 tablet (25 mg total) by mouth daily.    Dispense:  30 tablet    Refill:  3    Discontinue previous dose  . metoprolol tartrate (LOPRESSOR) 100 MG tablet    Sig: Take 1 tablet (100 mg  total) by mouth 2 (two) times daily.    Dispense:  60 tablet    Refill:  3  . SUMAtriptan (IMITREX) 50 MG tablet    Sig: TAKE 50 MG ORALLY AT THE ONSET OF A MIGRAINE. MAY REPEAT IN 2 HOURS IF HEADACHE PERSISTS OR RECURS. MAXIMUM DAILY DOSE 200 MG    Dispense:  10 tablet    Refill:  3  . topiramate (TOPAMAX) 100 MG tablet    Sig: Take 2 tablets (200 mg total) by mouth 2 (two) times daily.    Dispense:  120 tablet    Refill:  3  . cetirizine (ZYRTEC) 10 MG tablet    Sig: Take 1 tablet (10 mg total) by mouth daily.    Dispense:  30 tablet    Refill:  5  . cloNIDine (CATAPRES) 0.1 MG tablet    Sig: Take 1 tablet (0.1 mg total) by mouth at bedtime as needed. For hot flashes    Dispense:  30 tablet    Refill:  3    Follow-up: No follow-ups on file.   Hoy Register MD

## 2017-11-22 NOTE — Patient Instructions (Signed)

## 2017-11-23 MED FILL — MELOXICAM 7.5 MG TABLET: 7.5 | 30 days supply | Qty: 30 | Fill #0

## 2017-12-15 ENCOUNTER — Other Ambulatory Visit: Payer: Self-pay | Admitting: Family Medicine

## 2017-12-15 MED FILL — BUPROPION SR 150 MG TABLET: 150 | 30 days supply | Qty: 60 | Fill #0

## 2017-12-15 MED FILL — hydrOXYzine HCL 50 MG TABS: 50 | 30 days supply | Qty: 90 | Fill #0

## 2017-12-15 MED FILL — ?CETIRIZINE HCL 10 MG TABLE: 10 | 30 days supply | Qty: 30 | Fill #2

## 2017-12-15 MED FILL — GABAPENTIN 800 MG TABLET: 800 | 30 days supply | Qty: 120 | Fill #0

## 2017-12-15 MED FILL — DIVALPROEX SOD 500 MG TAB D: 500 | 30 days supply | Qty: 90 | Fill #0

## 2017-12-15 MED FILL — VIT D2 1.25 MG (50,000 UNIT: 1.25 MG | 28 days supply | Qty: 4 | Fill #2

## 2017-12-15 MED FILL — ?HALOPERIDOL 5 MG TABLET: 5 | 30 days supply | Qty: 30 | Fill #0

## 2017-12-15 MED FILL — $DEXILANT DR 60 MG CAPSULE: 60 | 30 days supply | Qty: 30 | Fill #7

## 2017-12-15 MED FILL — traZODone HCL 150 MG TABS: 150 | 30 days supply | Qty: 60 | Fill #0

## 2017-12-16 MED FILL — $VENTOLIN HFA 18G INHALER: 108 (90 BAS | 25 days supply | Qty: 18 | Fill #0

## 2017-12-24 ENCOUNTER — Telehealth: Payer: Self-pay

## 2017-12-24 NOTE — Telephone Encounter (Signed)
JA 

## 2017-12-29 ENCOUNTER — Other Ambulatory Visit: Payer: Self-pay

## 2017-12-29 MED ORDER — PREGABALIN 75 MG PO CAPS: 75.0000 mg | ORAL_CAPSULE | Freq: Two times a day (BID) | ORAL | 1 refills | Status: DC

## 2018-01-07 ENCOUNTER — Encounter

## 2018-01-10 ENCOUNTER — Ambulatory Visit: Payer: Self-pay | Attending: Family Medicine

## 2018-01-10 MED FILL — VIT D2 1.25 MG (50,000 UNIT: 1.25 MG | 28 days supply | Qty: 4 | Fill #3

## 2018-01-19 MED FILL — $DEXILANT DR 60 MG CAPSULE: 60 | 30 days supply | Qty: 30 | Fill #8

## 2018-01-19 MED FILL — MELOXICAM 7.5 MG TABLET: 7.5 | 30 days supply | Qty: 30 | Fill #1

## 2018-01-19 MED FILL — ?CETIRIZINE HCL 10 MG TABLE: 10 | 30 days supply | Qty: 30 | Fill #3

## 2018-01-19 MED FILL — hydrOXYzine HCL 50 MG TABS: 50 | 30 days supply | Qty: 90 | Fill #1

## 2018-01-19 MED FILL — ?CLONIDINE HCL 0.1 MG TABL: 0.1 | 30 days supply | Qty: 30 | Fill #1

## 2018-01-19 MED FILL — BUPROPION SR 150 MG TABLET: 150 | 30 days supply | Qty: 60 | Fill #1

## 2018-01-19 MED FILL — TOPIRAMATE 100 MG TABS: 100 | 30 days supply | Qty: 120 | Fill #1

## 2018-01-19 MED FILL — DIVALPROEX SOD DR 500 MG TA: 500 | 30 days supply | Qty: 90 | Fill #1

## 2018-01-19 MED FILL — $VENTOLIN HFA 18G INHALER: 108 (90 BAS | 25 days supply | Qty: 18 | Fill #1

## 2018-01-19 MED FILL — FLUTICASONE PROP 50 MCG SPR: 50 | 30 days supply | Qty: 16 | Fill #1

## 2018-01-19 MED FILL — HYDROCHLOROTHIAZIDE 25 MG T: 25 | 30 days supply | Qty: 30 | Fill #1

## 2018-01-19 MED FILL — HALOPERIDOL 5 MG TABLET: 5 | 28 days supply | Qty: 28 | Fill #1

## 2018-01-19 MED FILL — CYCLOBENZAPRINE 10 MG TAB: 10 | 30 days supply | Qty: 60 | Fill #1

## 2018-01-19 MED FILL — traZODone HCL 150 MG TABS: 150 | 30 days supply | Qty: 60 | Fill #1

## 2018-01-19 MED FILL — ?METOPROLOL 100 MG TABLET: 100 | 30 days supply | Qty: 60 | Fill #1

## 2018-01-19 MED FILL — ?SUMATRIPTAN SUCC 50MG TAB: 50 | 30 days supply | Qty: 9 | Fill #1

## 2018-01-19 MED FILL — GABAPENTIN 800 MG TABLET: 800 | 30 days supply | Qty: 120 | Fill #1

## 2018-01-19 MED FILL — ?ATORVASTATIN 20 MG TABLET: 20 | 30 days supply | Qty: 30 | Fill #1

## 2018-03-01 ENCOUNTER — Other Ambulatory Visit: Payer: Self-pay | Admitting: Family Medicine

## 2018-03-01 MED FILL — HYDROCHLOROTHIAZIDE 25 MG T: 25 | 30 days supply | Qty: 30 | Fill #2

## 2018-03-01 MED FILL — $DEXILANT DR 60 MG CAPSULE: 60 | 30 days supply | Qty: 30 | Fill #0

## 2018-03-01 MED FILL — traZODone HCL 150 MG TABS: 150 | 30 days supply | Qty: 60 | Fill #2

## 2018-03-01 MED FILL — ?CLONIDINE HCL 0.1 MG TABL: 0.1 | 30 days supply | Qty: 30 | Fill #2

## 2018-03-01 MED FILL — HALOPERIDOL 5 MG TABS: 5 | 28 days supply | Qty: 28 | Fill #2

## 2018-03-01 MED FILL — MELOXICAM 7.5 MG TABLET: 7.5 | 30 days supply | Qty: 30 | Fill #2

## 2018-03-01 MED FILL — CYCLOBENZAPRINE 10 MG TAB: 10 | 30 days supply | Qty: 60 | Fill #2

## 2018-03-01 MED FILL — TOPIRAMATE 100 MG TABS: 100 | 30 days supply | Qty: 120 | Fill #2

## 2018-03-01 MED FILL — ?ATORVASTATIN 20 MG TABLET: 20 | 30 days supply | Qty: 30 | Fill #2

## 2018-03-01 MED FILL — BUPROPION SR 150 MG TABLET: 150 | 30 days supply | Qty: 60 | Fill #2

## 2018-03-01 MED FILL — DIVALPROEX SOD DR 500 MG TA: 500 | 30 days supply | Qty: 90 | Fill #2

## 2018-03-01 MED FILL — GABAPENTIN 800 MG TABLET: 800 | 30 days supply | Qty: 120 | Fill #2

## 2018-03-01 MED FILL — hydrOXYzine HCL 50 MG TABS: 50 | 30 days supply | Qty: 90 | Fill #2

## 2018-03-01 MED FILL — ?METOPROLOL 100 MG TABLET: 100 | 30 days supply | Qty: 60 | Fill #2

## 2018-03-02 MED FILL — $VENTOLIN HFA 18G INHALER: 108 (90 BAS | 75 days supply | Qty: 54 | Fill #0

## 2018-03-02 MED FILL — ?CETIRIZINE HCL 10 MG TABLE: 10 | 30 days supply | Qty: 30 | Fill #4

## 2018-03-08 ENCOUNTER — Ambulatory Visit (INDEPENDENT_AMBULATORY_CARE_PROVIDER_SITE_OTHER): Payer: Self-pay | Admitting: Orthopaedic Surgery

## 2018-03-15 ENCOUNTER — Ambulatory Visit (INDEPENDENT_AMBULATORY_CARE_PROVIDER_SITE_OTHER): Payer: Self-pay | Admitting: Orthopaedic Surgery

## 2018-03-30 MED FILL — GABAPENTIN 800 MG TABLET: 800 | 30 days supply | Qty: 120 | Fill #0

## 2018-03-30 MED FILL — BUPROPION SR 150 MG TABLET: 150 | 30 days supply | Qty: 30 | Fill #0

## 2018-03-30 MED FILL — traZODone HCL 100 MG TABS: 100 | 30 days supply | Qty: 90 | Fill #0

## 2018-03-30 MED FILL — HALOPERIDOL 5 MG TABS: 5 | 30 days supply | Qty: 45 | Fill #0

## 2018-03-30 MED FILL — DIVALPROEX SOD DR 500 MG TA: 500 | 30 days supply | Qty: 90 | Fill #1

## 2018-03-30 MED FILL — hydrOXYzine HCL 50 MG TABS: 50 | 30 days supply | Qty: 90 | Fill #0

## 2018-03-31 ENCOUNTER — Ambulatory Visit: Payer: Self-pay | Admitting: Family Medicine

## 2018-04-04 MED FILL — VIT D2 1.25 MG (50,000 UNIT: 1.25 MG | 14 days supply | Qty: 2 | Fill #4

## 2018-04-04 MED FILL — SUMAtriptan SUCCINATE 50 MG: 50 | 30 days supply | Qty: 9 | Fill #2

## 2018-04-05 ENCOUNTER — Ambulatory Visit: Payer: Self-pay | Attending: Family Medicine

## 2018-04-12 MED FILL — ?METOPROLOL 100 MG TABLET: 100 | 30 days supply | Qty: 60 | Fill #3

## 2018-04-12 MED FILL — ?ATORVASTATIN 20 MG TABLET: 20 | 30 days supply | Qty: 30 | Fill #3

## 2018-04-12 MED FILL — TOPIRAMATE 100 MG TABS: 100 | 30 days supply | Qty: 120 | Fill #3

## 2018-04-12 MED FILL — HYDROCHLOROTHIAZIDE 25 MG T: 25 | 30 days supply | Qty: 30 | Fill #3

## 2018-04-12 MED FILL — ?CLONIDINE HCL 0.1 MG TABL: 0.1 | 30 days supply | Qty: 30 | Fill #3

## 2018-04-12 MED FILL — CYCLOBENZAPRINE 10 MG TAB: 10 | 30 days supply | Qty: 60 | Fill #3

## 2018-04-15 ENCOUNTER — Ambulatory Visit: Payer: Self-pay | Admitting: Family Medicine

## 2018-04-22 ENCOUNTER — Ambulatory Visit: Payer: Self-pay | Attending: Family Medicine

## 2018-04-22 ENCOUNTER — Other Ambulatory Visit: Payer: Self-pay | Admitting: Family Medicine

## 2018-04-22 DIAGNOSIS — M25561 Pain in right knee: Secondary | ICD-10-CM

## 2018-04-22 MED FILL — $DEXILANT DR 60 MG CAPSULE: 60 | 60 days supply | Qty: 60 | Fill #1

## 2018-04-22 MED FILL — MELOXICAM 7.5 MG TABLET: 7.5 | 30 days supply | Qty: 30 | Fill #0

## 2018-05-19 ENCOUNTER — Other Ambulatory Visit: Payer: Self-pay | Admitting: Family Medicine

## 2018-05-19 DIAGNOSIS — K219 Gastro-esophageal reflux disease without esophagitis: Secondary | ICD-10-CM

## 2018-05-19 DIAGNOSIS — J328 Other chronic sinusitis: Secondary | ICD-10-CM

## 2018-05-19 DIAGNOSIS — J302 Other seasonal allergic rhinitis: Secondary | ICD-10-CM

## 2018-05-19 MED FILL — CYCLOBENZAPRINE 10 MG TAB: 10 | 30 days supply | Qty: 60 | Fill #2

## 2018-05-19 MED FILL — traZODone HCL 100 MG TABS: 100 | 30 days supply | Qty: 90 | Fill #1

## 2018-05-19 MED FILL — ?CLONIDINE HCL 0.1 MG TABL: 0.1 | 30 days supply | Qty: 30 | Fill #1

## 2018-05-19 MED FILL — BUPROPION SR 150 MG TABLET: 150 | 30 days supply | Qty: 30 | Fill #1

## 2018-05-19 MED FILL — hydrOXYzine HCL 50 MG TABS: 50 | 30 days supply | Qty: 90 | Fill #1

## 2018-05-19 MED FILL — HYDROCHLOROTHIAZIDE 25 MG T: 25 | 30 days supply | Qty: 30 | Fill #1

## 2018-05-19 MED FILL — HALOPERIDOL 5 MG TABS: 5 | 30 days supply | Qty: 45 | Fill #1

## 2018-05-19 MED FILL — TOPIRAMATE 100 MG TABS: 100 | 30 days supply | Qty: 120 | Fill #0

## 2018-05-19 MED FILL — DIVALPROEX SOD 500 MG TAB D: 500 | 30 days supply | Qty: 90 | Fill #2

## 2018-05-19 MED FILL — METOPROLOL TARTRATE 100 MG: 100 | 30 days supply | Qty: 60 | Fill #1

## 2018-05-19 MED FILL — GABAPENTIN 800 MG TABLET: 800 | 30 days supply | Qty: 120 | Fill #0

## 2018-05-19 MED FILL — ?CETIRIZINE HCL 10 MG TABLE: 10 | 30 days supply | Qty: 30 | Fill #5

## 2018-05-19 MED FILL — ?ATORVASTATIN 20 MG TABLET: 20 | 30 days supply | Qty: 30 | Fill #4

## 2018-05-20 MED FILL — FLUTICASONE PROP 50 MCG SPR: 50 | 30 days supply | Qty: 16 | Fill #0

## 2018-05-20 MED FILL — OLOPATADINE HCL 0.1% EYE DR: 0.1 | 15 days supply | Qty: 5 | Fill #0

## 2018-05-24 ENCOUNTER — Telehealth: Payer: Self-pay | Admitting: Family Medicine

## 2018-05-24 NOTE — Telephone Encounter (Signed)
Pt came in to request a call back from her financial counselor, please give patient a call when possible she has some questions about her discount

## 2018-06-10 ENCOUNTER — Other Ambulatory Visit: Payer: Self-pay | Admitting: Family Medicine

## 2018-06-10 DIAGNOSIS — M25561 Pain in right knee: Secondary | ICD-10-CM

## 2018-06-10 MED FILL — $DEXILANT DR 60 MG CAPSULE: 60 | 90 days supply | Qty: 90 | Fill #0

## 2018-06-10 MED FILL — ?SUMATRIPTAN SUCC 50 MG TAB: 50 | 30 days supply | Qty: 9 | Fill #3

## 2018-06-13 ENCOUNTER — Ambulatory Visit: Payer: Self-pay | Admitting: Family Medicine

## 2018-06-14 ENCOUNTER — Other Ambulatory Visit: Payer: Self-pay | Admitting: Family Medicine

## 2018-06-14 DIAGNOSIS — M25561 Pain in right knee: Secondary | ICD-10-CM

## 2018-06-15 MED FILL — MELOXICAM 7.5 MG TABLET: 7.5 | 30 days supply | Qty: 30 | Fill #0

## 2018-07-08 ENCOUNTER — Other Ambulatory Visit: Payer: Self-pay | Admitting: Family Medicine

## 2018-07-08 DIAGNOSIS — J302 Other seasonal allergic rhinitis: Secondary | ICD-10-CM

## 2018-07-08 MED FILL — DIVALPROEX SOD 500 MG TAB D: 500 | 30 days supply | Qty: 90 | Fill #0

## 2018-07-08 MED FILL — GABAPENTIN 800 MG TABLET: 800 | 30 days supply | Qty: 120 | Fill #1

## 2018-07-08 MED FILL — hydrOXYzine HCL 50 MG TABS: 50 | 30 days supply | Qty: 90 | Fill #2

## 2018-07-08 MED FILL — CYCLOBENZAPRINE 10 MG TAB: 10 | 30 days supply | Qty: 60 | Fill #3

## 2018-07-08 MED FILL — ?CLONIDINE HCL 0.1 MG TABL: 0.1 | 30 days supply | Qty: 30 | Fill #2

## 2018-07-08 MED FILL — METOPROLOL TARTRATE 100 MG: 100 | 30 days supply | Qty: 60 | Fill #2

## 2018-07-08 MED FILL — TOPIRAMATE 100 MG TABS: 100 | 30 days supply | Qty: 120 | Fill #1

## 2018-07-08 MED FILL — ?CETIRIZINE HCL 10 MG TABLE: 10 | 30 days supply | Qty: 30 | Fill #0

## 2018-07-08 MED FILL — HALOPERIDOL 5 MG TABS: 5 | 30 days supply | Qty: 45 | Fill #2

## 2018-07-08 MED FILL — HYDROCHLOROTHIAZIDE 25 MG T: 25 | 30 days supply | Qty: 30 | Fill #2

## 2018-07-08 MED FILL — BUPROPION SR 150 MG TABLET: 150 | 30 days supply | Qty: 30 | Fill #2

## 2018-07-08 MED FILL — traZODone HCL 100 MG TABS: 100 | 30 days supply | Qty: 90 | Fill #2

## 2018-07-08 MED FILL — FLUTICASONE PROP 50 MCG SPR: 50 | 30 days supply | Qty: 16 | Fill #1

## 2018-07-08 MED FILL — ?ATORVASTATIN 20 MG TABLET: 20 | 30 days supply | Qty: 30 | Fill #5

## 2018-07-11 MED FILL — OLOPATADINE HCL 0.1% EYE DR: 0.1 | 18 days supply | Qty: 5 | Fill #0

## 2018-07-11 MED FILL — ALBUTEROL SULFATE HFA 108 (: 108 (90 BAS | 25 days supply | Qty: 18 | Fill #0

## 2018-07-13 ENCOUNTER — Telehealth: Payer: Self-pay | Admitting: Family Medicine

## 2018-07-13 NOTE — Telephone Encounter (Signed)
LVM that I was returning her call. 

## 2018-07-25 ENCOUNTER — Encounter: Payer: Self-pay | Admitting: Family Medicine

## 2018-07-25 ENCOUNTER — Ambulatory Visit: Payer: Self-pay | Attending: Family Medicine | Admitting: Family Medicine

## 2018-07-25 ENCOUNTER — Ambulatory Visit: Payer: Self-pay

## 2018-07-25 VITALS — BP 171/94 | HR 101 | Temp 98.5°F | Ht 67.0 in | Wt 224.0 lb

## 2018-07-25 DIAGNOSIS — J329 Chronic sinusitis, unspecified: Secondary | ICD-10-CM | POA: Insufficient documentation

## 2018-07-25 DIAGNOSIS — Z886 Allergy status to analgesic agent status: Secondary | ICD-10-CM | POA: Insufficient documentation

## 2018-07-25 DIAGNOSIS — K219 Gastro-esophageal reflux disease without esophagitis: Secondary | ICD-10-CM

## 2018-07-25 DIAGNOSIS — H6121 Impacted cerumen, right ear: Secondary | ICD-10-CM

## 2018-07-25 DIAGNOSIS — M5416 Radiculopathy, lumbar region: Secondary | ICD-10-CM

## 2018-07-25 DIAGNOSIS — I10 Essential (primary) hypertension: Secondary | ICD-10-CM

## 2018-07-25 DIAGNOSIS — J45909 Unspecified asthma, uncomplicated: Secondary | ICD-10-CM | POA: Insufficient documentation

## 2018-07-25 DIAGNOSIS — Z79899 Other long term (current) drug therapy: Secondary | ICD-10-CM | POA: Insufficient documentation

## 2018-07-25 DIAGNOSIS — K589 Irritable bowel syndrome without diarrhea: Secondary | ICD-10-CM | POA: Insufficient documentation

## 2018-07-25 DIAGNOSIS — M25561 Pain in right knee: Secondary | ICD-10-CM

## 2018-07-25 DIAGNOSIS — E559 Vitamin D deficiency, unspecified: Secondary | ICD-10-CM

## 2018-07-25 DIAGNOSIS — N951 Menopausal and female climacteric states: Secondary | ICD-10-CM | POA: Insufficient documentation

## 2018-07-25 DIAGNOSIS — G5603 Carpal tunnel syndrome, bilateral upper limbs: Secondary | ICD-10-CM | POA: Insufficient documentation

## 2018-07-25 DIAGNOSIS — F419 Anxiety disorder, unspecified: Secondary | ICD-10-CM | POA: Insufficient documentation

## 2018-07-25 DIAGNOSIS — E7800 Pure hypercholesterolemia, unspecified: Secondary | ICD-10-CM

## 2018-07-25 DIAGNOSIS — G43709 Chronic migraine without aura, not intractable, without status migrainosus: Secondary | ICD-10-CM

## 2018-07-25 DIAGNOSIS — F319 Bipolar disorder, unspecified: Secondary | ICD-10-CM | POA: Insufficient documentation

## 2018-07-25 DIAGNOSIS — G629 Polyneuropathy, unspecified: Secondary | ICD-10-CM | POA: Insufficient documentation

## 2018-07-25 DIAGNOSIS — J328 Other chronic sinusitis: Secondary | ICD-10-CM

## 2018-07-25 DIAGNOSIS — R232 Flushing: Secondary | ICD-10-CM

## 2018-07-25 DIAGNOSIS — Z880 Allergy status to penicillin: Secondary | ICD-10-CM | POA: Insufficient documentation

## 2018-07-25 DIAGNOSIS — Z791 Long term (current) use of non-steroidal anti-inflammatories (NSAID): Secondary | ICD-10-CM | POA: Insufficient documentation

## 2018-07-25 MED ORDER — MELOXICAM 15 MG PO TABS: 15.0000 mg | ORAL_TABLET | Freq: Every day | ORAL | 3 refills | Status: DC

## 2018-07-25 MED ORDER — DEXLANSOPRAZOLE 60 MG PO CPDR: 1.0000 | DELAYED_RELEASE_CAPSULE | Freq: Every day | ORAL | 2 refills | Status: DC

## 2018-07-25 MED ORDER — ATORVASTATIN CALCIUM 20 MG PO TABS: 20.0000 mg | ORAL_TABLET | Freq: Every day | ORAL | 5 refills | Status: DC

## 2018-07-25 MED ORDER — HYDROCHLOROTHIAZIDE 25 MG PO TABS: 25.0000 mg | ORAL_TABLET | Freq: Every day | ORAL | 3 refills | Status: DC

## 2018-07-25 MED ORDER — CLONIDINE HCL 0.1 MG PO TABS: 0.1000 mg | ORAL_TABLET | Freq: Every evening | ORAL | 3 refills | Status: DC | PRN

## 2018-07-25 MED ORDER — CETIRIZINE HCL 10 MG PO TABS: 10.0000 mg | ORAL_TABLET | Freq: Every day | ORAL | 5 refills | Status: DC

## 2018-07-25 MED ORDER — CYCLOBENZAPRINE HCL 10 MG PO TABS: 10.0000 mg | ORAL_TABLET | Freq: Two times a day (BID) | ORAL | 3 refills | Status: DC

## 2018-07-25 MED ORDER — TOPIRAMATE 100 MG PO TABS: 200.0000 mg | ORAL_TABLET | Freq: Two times a day (BID) | ORAL | 3 refills | Status: DC

## 2018-07-25 MED ORDER — METOPROLOL TARTRATE 100 MG PO TABS: 100.0000 mg | ORAL_TABLET | Freq: Two times a day (BID) | ORAL | 3 refills | Status: DC

## 2018-07-25 MED ORDER — PREGABALIN 75 MG PO CAPS: 75.0000 mg | ORAL_CAPSULE | Freq: Two times a day (BID) | ORAL | 1 refills | Status: DC

## 2018-07-25 MED ORDER — AMLODIPINE BESYLATE 5 MG PO TABS: 5.0000 mg | ORAL_TABLET | Freq: Every day | ORAL | 3 refills | Status: DC

## 2018-07-25 MED ORDER — CARBAMIDE PEROXIDE 6.5 % OT SOLN: 5.0000 [drp] | Freq: Two times a day (BID) | OTIC | 0 refills | Status: DC

## 2018-07-25 NOTE — Progress Notes (Signed)
Subjective:  Patient ID: Amanda [name], female    DOB: 08-May-1966  Age: 52 y.o. MRN: [id number]  CC: Leg Pain and Ear Pain   HPI Amanda [name] is a 52 year old female with a history of hypertension, bipolar disorder, neuropathy, chronic low back pain, PUD/GERD, Migraine, carpal tunnel syndrome Who comes in today for a follow-up Visit. She is requesting a letter for disability for her Amanda [name] as she has been unable to work due to persistent leg pains from her peripheral neuropathy which also affects her feet and she describes this as feeling like she will fall.  She also has low back pain with lumbar radiculopathy and has been seen by orthopedics in the past status post epidural spinal injections with no much relief. Currently on Lyrica and meloxicam but symptoms persist and this prevented her from working. Her blood pressure is elevated and she endorses compliance with her antihypertensives. Bipolar disorder is managed by her psychiatrist at [facility]. Migraine has been controlled. Today she complains of hearing impairment from both the ears for the last 3 months which she describes as a wave in her ears.  Denies sinus symptoms, upper respiratory symptoms or fever.  Past Medical History:  Diagnosis Date  . Anxiety   . Arthritis    lower back  . Asthma   . Bipolar disorder ([place])   . Carpal tunnel syndrome, bilateral   . Chronic back pain   . Depression   . Diverticulosis   . Fx. left wrist   . GERD (gastroesophageal reflux disease)   . Heart murmur    "born with"  . Hypertension   . IBS (irritable bowel syndrome)   . Internal hemorrhoids   . Muscle spasms of neck   . Neuropathy   . PUD (peptic ulcer disease)     Past Surgical History:  Procedure Laterality Date  . ARTHRODESIS METATARSALPHALANGEAL JOINT (MTPJ) Right 04/29/2016   Procedure: RIGHT GREAT TOE METATARSOPHALANGEAL JOINT (MTPJ) FUSION, HARDWARE REMOVAL;  Surgeon: Leandrew Koyanagi, MD;  Location: [facility];  Service: Orthopedics;  Laterality: Right;  RIGHT GREAT TOE METATARSOPHALANGEAL JOINT (MTPJ) FUSION, HARDWARE REMOVAL  . CARPAL TUNNEL RELEASE Right 2003  . CHOLECYSTECTOMY N/A 02/09/2014   Procedure: LAPAROSCOPIC CHOLECYSTECTOMY WITH INTRAOPERATIVE CHOLANGIOGRAM;  Surgeon: Pedro Earls, MD;  Location: [facility];  Service: General;  Laterality: N/A;  . HAMMER TOE FUSION Bilateral 2008  . HARDWARE REMOVAL Right 04/29/2016   Procedure: HARDWARE REMOVAL;  Surgeon: Leandrew Koyanagi, MD;  Location: [place] [facility];  Service: Orthopedics;  Laterality: Right;  HARDWARE REMOVAL  . ROTATOR CUFF REPAIR Right 2011  . WRIST FRACTURE SURGERY Left     Allergies  Allergen Reactions  . Penicillins Anaphylaxis    Has patient had a PCN reaction causing immediate rash, facial/tongue/throat swelling, SOB or lightheadedness with hypotension: Yes Has patient had a PCN reaction causing severe rash involving mucus membranes or skin necrosis: Yes Has patient had a PCN reaction that required hospitalization Yes Has patient had a PCN reaction occurring within the last 10 years: No-more than 10 years ago If all of the above answers are "NO", then may proceed with Cephalosporin use.   Diona Fanti [Aspirin] Rash     Outpatient Medications Prior to Visit  Medication Sig Dispense Refill  . buPROPion (WELLBUTRIN SR) 150 MG 12 hr tablet Take 150 mg by mouth 2 (two) times daily.    . colestipol (COLESTID) 1 g tablet Take 1 tablet (1 g  total) by mouth every morning. 30 tablet 4  . divalproex (DEPAKOTE) 500 MG DR tablet Take 500 mg by mouth 3 (three) times daily.    . fluticasone (FLONASE) 50 MCG/ACT nasal spray PLACE 2 SPRAYS INTO BOTH NOSTRILS DAILY. 16 g 1  . haloperidol (HALDOL) 5 MG tablet Take 1 tablet by mouth daily.  0  . hydrOXYzine (ATARAX/VISTARIL) 50 MG tablet Take 1 tablet (50 mg total) by mouth 3 (three) times daily as needed for anxiety or itching. 90 tablet 2  . naproxen (NAPROSYN) 500 MG tablet  Take 1 tablet (500 mg total) by mouth 2 (two) times daily with a meal. 30 tablet 3  . olopatadine (PATANOL) 0.1 % ophthalmic solution PLACE 1 DROP INTO BOTH EYES 2 TIMES DAILY. 5 mL 0  . SUMAtriptan (IMITREX) 50 MG tablet TAKE 50 MG ORALLY AT THE ONSET OF A MIGRAINE. MAY REPEAT IN 2 HOURS IF HEADACHE PERSISTS OR RECURS. MAXIMUM DAILY DOSE 200 MG 10 tablet 3  . traZODone (DESYREL) 300 MG tablet Take 1 tablet (300 mg total) by mouth at bedtime. 30 tablet 0  . VENTOLIN HFA 108 (90 Base) MCG/ACT inhaler INHALE 2 PUFFS INTO THE LUNGS EVERY 6 HOURS AS NEEDED FOR WHEEZING OR SHORTNESS OF BREATH. 18 g 0  . atorvastatin (LIPITOR) 20 MG tablet Take 1 tablet (20 mg total) by mouth daily. 30 tablet 5  . cetirizine (ZYRTEC) 10 MG tablet Take 1 tablet (10 mg total) by mouth daily. 30 tablet 5  . cloNIDine (CATAPRES) 0.1 MG tablet Take 1 tablet (0.1 mg total) by mouth at bedtime as needed. For hot flashes 30 tablet 3  . cyclobenzaprine (FLEXERIL) 10 MG tablet Take 1 tablet (10 mg total) by mouth 2 (two) times daily. prn 60 tablet 3  . DEXILANT 60 MG capsule TAKE 1 CAPSULE BY MOUTH DAILY 30 capsule 2  . hydrochlorothiazide (HYDRODIURIL) 25 MG tablet Take 1 tablet (25 mg total) by mouth daily. 30 tablet 3  . meloxicam (MOBIC) 7.5 MG tablet TAKE 1 TABLET BY MOUTH DAILY. 30 tablet 0  . metoprolol tartrate (LOPRESSOR) 100 MG tablet Take 1 tablet (100 mg total) by mouth 2 (two) times daily. 60 tablet 3  . predniSONE (STERAPRED UNI-PAK 21 TAB) 10 MG (21) TBPK tablet Take as directed 21 tablet 0  . pregabalin (LYRICA) 75 MG capsule Take 1 capsule (75 mg total) by mouth 2 (two) times daily. 180 capsule 1  . tiZANidine (ZANAFLEX) 4 MG tablet Take 1 tablet (4 mg total) by mouth every 6 (six) hours as needed for muscle spasms. 30 tablet 2  . topiramate (TOPAMAX) 100 MG tablet Take 2 tablets (200 mg total) by mouth 2 (two) times daily. 120 tablet 3  . ergocalciferol (DRISDOL) 50000 units capsule Take 1 capsule (50,000 Units  total) by mouth once a week. (Patient not taking: Reported on 07/25/2018) 9 capsule 1   Facility-Administered Medications Prior to Visit  Medication Dose Route Frequency Provider Last Rate Last Dose  . lidocaine (PF) (XYLOCAINE) 1 % injection 0.3 mL  0.3 mL Other Once Magnus Sinning, MD        ROS Review of Systems  Constitutional: Negative for activity change, appetite change and fatigue.  HENT: Positive for ear discharge. Negative for congestion, sinus pressure and sore throat.   Eyes: Negative for visual disturbance.  Respiratory: Negative for cough, chest tightness, shortness of breath and wheezing.   Cardiovascular: Negative for chest pain and palpitations.  Gastrointestinal: Negative for abdominal distention, abdominal pain  and constipation.  Endocrine: Negative for polydipsia.  Genitourinary: Negative for dysuria and frequency.  Musculoskeletal: Positive for back pain. Negative for arthralgias.  Skin: Negative for rash.  Neurological: Positive for numbness. Negative for tremors and light-headedness.  Hematological: Does not bruise/bleed easily.  Psychiatric/Behavioral: Negative for agitation and behavioral problems.    Objective:  BP (!) 171/94   Pulse (!) 101   Temp 98.5 F (36.9 C) (Oral)   Ht _0  (1.702 m)   Wt 224 lb (101.6 kg)   LMP 06/10/2017   SpO2 99%   BMI 35.08 kg/m   BP/Weight 07/25/2018 11/22/2017 6/31/4970  Systolic BP 263 785 -  Diastolic BP 94 82 -  Wt. (Lbs) 224 225.2 -  BMI 35.08 35.27 36.02  Some encounter information is confidential and restricted. Go to Review Flowsheets activity to see all data.      Physical Exam  Constitutional: She is oriented to person, place, and time. She appears well-developed and well-nourished.  HENT:  Cerumen obscuring right tympanic membrane Left eustachian tube is normal  Cardiovascular: Normal rate, normal heart sounds and intact distal pulses.  No murmur heard. Pulmonary/Chest: Effort normal and breath  sounds normal. She has no wheezes. She has no rales. She exhibits no tenderness.  Abdominal: Soft. Bowel sounds are normal. She exhibits no distension and no mass. There is no tenderness.  Musculoskeletal:  Tenderness on palpation of left side of lumbar spine Negative straight leg raise bilaterally  Neurological: She is alert and oriented to person, place, and time. She displays normal reflexes.  Skin: Skin is warm and dry.  Psychiatric: She has a normal mood and affect.    CMP Latest Ref Rng & Units 09/30/2017 12/18/2016 04/28/2016  Glucose 65 - 99 mg/dL 108(H) 119(H) 87  BUN 6 - 24 mg/dL _1 Creatinine 0.57 - 1.00 mg/dL 1.13(H) 0.83 0.86  Sodium 134 - 144 mmol/L 142 141 139  Potassium 3.5 - 5.2 mmol/L 4.1 4.1 3.9  Chloride 96 - 106 mmol/L 106 106 112(H)  CO2 20 - 29 mmol/L 20 18 20(L)  Calcium 8.7 - 10.2 mg/dL 9.3 9.6 8.8(L)  Total Protein 6.0 - 8.5 g/dL 7.2 6.8 -  Total Bilirubin 0.0 - 1.2 mg/dL 0.5 <0.2 -  Alkaline Phos 39 - 117 IU/L 125(H) 99 -  AST 0 - 40 IU/L 26 24 -  ALT 0 - 32 IU/L 32 42(H) -    Lipid Panel     Component Value Date/Time   CHOL 205 (H) 09/30/2017 1104   TRIG 161 (H) 09/30/2017 1104   HDL 52 09/30/2017 1104   CHOLHDL 3.9 09/30/2017 1104   CHOLHDL 2.9 11/27/2015 1038   VLDL 9 11/27/2015 1038   LDLCALC 121 (H) 09/30/2017 1104     Assessment & Plan:   1. Pure hypercholesterolemia Stable Low-cholesterol diet - atorvastatin (LIPITOR) 20 MG tablet; Take 1 tablet (20 mg total) by mouth daily.  Dispense: 30 tablet; Refill: 5  2. Other chronic sinusitis Controlled - cetirizine (ZYRTEC) 10 MG tablet; Take 1 tablet (10 mg total) by mouth daily.  Dispense: 30 tablet; Refill: 5  3. Gastroesophageal reflux disease without esophagitis Controlled - dexlansoprazole (DEXILANT) 60 MG capsule; Take 1 capsule (60 mg total) by mouth daily.  Dispense: 30 capsule; Refill: 2  4. Hot flashes Controlled - cloNIDine (CATAPRES) 0.1 MG tablet; Take 1 tablet (0.1 mg  total) by mouth at bedtime as needed. For hot flashes  Dispense: 30 tablet; Refill: 3  5. Lumbar  radiculopathy Uncontrolled Apply heat Letter has been provided for her Attorney to this effect - cyclobenzaprine (FLEXERIL) 10 MG tablet; Take 1 tablet (10 mg total) by mouth 2 (two) times daily. prn  Dispense: 60 tablet; Refill: 3 - pregabalin (LYRICA) 75 MG capsule; Take 1 capsule (75 mg total) by mouth 2 (two) times daily.  Dispense: 180 capsule; Refill: 1  6. Essential hypertension Uncontrolled Amlodipine added to regimen - VITAMIN D 25 Hydroxy (Vit-D Deficiency, Fractures) - hydrochlorothiazide (HYDRODIURIL) 25 MG tablet; Take 1 tablet (25 mg total) by mouth daily.  Dispense: 30 tablet; Refill: 3 - metoprolol tartrate (LOPRESSOR) 100 MG tablet; Take 1 tablet (100 mg total) by mouth 2 (two) times daily.  Dispense: 60 tablet; Refill: 3 - amLODipine (NORVASC) 5 MG tablet; Take 1 tablet (5 mg total) by mouth daily.  Dispense: 30 tablet; Refill: 3 - CMP14+EGFR  7. Acute pain of right knee - meloxicam (MOBIC) 15 MG tablet; Take 1 tablet (15 mg total) by mouth daily.  Dispense: 30 tablet; Refill: 3  8. Chronic migraine without aura without status migrainosus, not intractable Controlled - topiramate (TOPAMAX) 100 MG tablet; Take 2 tablets (200 mg total) by mouth 2 (two) times daily.  Dispense: 120 tablet; Refill: 3  9. Impacted cerumen of right ear - carbamide peroxide (DEBROX) 6.5 % OTIC solution; Place 5 drops into the right ear 2 (two) times daily.  Dispense: 15 mL; Refill: 0   Meds ordered this encounter  Medications  . carbamide peroxide (DEBROX) 6.5 % OTIC solution    Sig: Place 5 drops into the right ear 2 (two) times daily.    Dispense:  15 mL    Refill:  0  . atorvastatin (LIPITOR) 20 MG tablet    Sig: Take 1 tablet (20 mg total) by mouth daily.    Dispense:  30 tablet    Refill:  5  . cetirizine (ZYRTEC) 10 MG tablet    Sig: Take 1 tablet (10 mg total) by mouth daily.     Dispense:  30 tablet    Refill:  5  . dexlansoprazole (DEXILANT) 60 MG capsule    Sig: Take 1 capsule (60 mg total) by mouth daily.    Dispense:  30 capsule    Refill:  2  . cloNIDine (CATAPRES) 0.1 MG tablet    Sig: Take 1 tablet (0.1 mg total) by mouth at bedtime as needed. For hot flashes    Dispense:  30 tablet    Refill:  3  . cyclobenzaprine (FLEXERIL) 10 MG tablet    Sig: Take 1 tablet (10 mg total) by mouth 2 (two) times daily. prn    Dispense:  60 tablet    Refill:  3  . hydrochlorothiazide (HYDRODIURIL) 25 MG tablet    Sig: Take 1 tablet (25 mg total) by mouth daily.    Dispense:  30 tablet    Refill:  3    Discontinue previous dose  . meloxicam (MOBIC) 15 MG tablet    Sig: Take 1 tablet (15 mg total) by mouth daily.    Dispense:  30 tablet    Refill:  3  . metoprolol tartrate (LOPRESSOR) 100 MG tablet    Sig: Take 1 tablet (100 mg total) by mouth 2 (two) times daily.    Dispense:  60 tablet    Refill:  3  . pregabalin (LYRICA) 75 MG capsule    Sig: Take 1 capsule (75 mg total) by mouth 2 (two) times daily.  Dispense:  180 capsule    Refill:  1  . topiramate (TOPAMAX) 100 MG tablet    Sig: Take 2 tablets (200 mg total) by mouth 2 (two) times daily.    Dispense:  120 tablet    Refill:  3  . amLODipine (NORVASC) 5 MG tablet    Sig: Take 1 tablet (5 mg total) by mouth daily.    Dispense:  30 tablet    Refill:  3    Follow-up: Return in about 3 months (around 10/24/2018) for Follow-up of chronic medical conditions.   Charlott Rakes MD

## 2018-07-26 ENCOUNTER — Other Ambulatory Visit: Payer: Self-pay | Admitting: Family Medicine

## 2018-07-26 LAB — CMP14+EGFR
ALT: 28 IU/L (ref 0–32)
AST: 24 IU/L (ref 0–40)
Albumin/Globulin Ratio: 1.8 (ref 1.2–2.2)
Albumin: 4.4 g/dL (ref 3.5–5.5)
Alkaline Phosphatase: 107 IU/L (ref 39–117)
BUN/Creatinine Ratio: 11 (ref 9–23)
BUN: 11 mg/dL (ref 6–24)
Bilirubin Total: 0.2 mg/dL (ref 0.0–1.2)
CO2: 20 mmol/L (ref 20–29)
Calcium: 9.5 mg/dL (ref 8.7–10.2)
Chloride: 107 mmol/L — ABNORMAL HIGH (ref 96–106)
Creatinine, Ser: 0.99 mg/dL (ref 0.57–1.00)
GFR calc Af Amer: 75 mL/min/{1.73_m2} (ref >59)
GFR calc non Af Amer: 65 mL/min/{1.73_m2} (ref >59)
Globulin, Total: 2.4 g/dL (ref 1.5–4.5)
Glucose: 85 mg/dL (ref 65–99)
Potassium: 3.9 mmol/L (ref 3.5–5.2)
Sodium: 141 mmol/L (ref 134–144)
Total Protein: 6.8 g/dL (ref 6.0–8.5)

## 2018-07-26 LAB — VITAMIN D 25 HYDROXY (VIT D DEFICIENCY, FRACTURES): Vit D, 25-Hydroxy: 22.4 ng/mL — ABNORMAL LOW (ref 30.0–100.0)

## 2018-07-26 MED ORDER — ERGOCALCIFEROL 1.25 MG (50000 UT) PO CAPS: 50000.0000 [IU] | ORAL_CAPSULE | ORAL | 1 refills | Status: DC

## 2018-07-26 MED FILL — ?CLONIDINE HCL 0.1 MG TABL: 0.1 | 30 days supply | Qty: 30 | Fill #0

## 2018-07-26 MED FILL — ?AMLODIPINE BESYLATE 5 MG T: 5 | 30 days supply | Qty: 30 | Fill #0

## 2018-07-26 MED FILL — MELOXICAM 15 MG TABLET: 15 | 30 days supply | Qty: 30 | Fill #0

## 2018-07-27 MED FILL — VIT D2 1.25 MG (50,000 UNIT: 1.25 MG | 28 days supply | Qty: 4 | Fill #0

## 2018-08-01 ENCOUNTER — Telehealth: Payer: Self-pay

## 2018-08-01 NOTE — Telephone Encounter (Signed)
Patient was called and informed of lab results and medication being sent to pharmacy. 

## 2018-08-01 NOTE — Telephone Encounter (Signed)
-----   Message from Hoy RegisterEnobong Newlin, MD sent at 07/26/2018  5:18 PM EST ----- Vitamin D has improved from previous labs but is still low.  I have sent a refill to her pharmacy

## 2018-08-24 DIAGNOSIS — I469 Cardiac arrest, cause unspecified: Secondary | ICD-10-CM

## 2018-08-24 HISTORY — DX: Cardiac arrest, cause unspecified: I46.9

## 2018-08-25 ENCOUNTER — Emergency Department (HOSPITAL_COMMUNITY): Payer: BLUE CROSS/BLUE SHIELD

## 2018-08-25 ENCOUNTER — Inpatient Hospital Stay (HOSPITAL_COMMUNITY)
Admission: EM | Admit: 2018-08-25 | Discharge: 2018-08-30 | DRG: 193 | Disposition: A | Discharge status: Home / Self Care | Payer: BLUE CROSS/BLUE SHIELD | Attending: Internal Medicine | Admitting: Internal Medicine

## 2018-08-25 ENCOUNTER — Encounter (HOSPITAL_COMMUNITY): Payer: Self-pay | Admitting: Internal Medicine

## 2018-08-25 ENCOUNTER — Other Ambulatory Visit: Payer: Self-pay

## 2018-08-25 DIAGNOSIS — J9601 Acute respiratory failure with hypoxia: Secondary | ICD-10-CM | POA: Diagnosis present

## 2018-08-25 DIAGNOSIS — I214 Non-ST elevation (NSTEMI) myocardial infarction: Secondary | ICD-10-CM | POA: Diagnosis present

## 2018-08-25 DIAGNOSIS — G8929 Other chronic pain: Secondary | ICD-10-CM | POA: Diagnosis present

## 2018-08-25 DIAGNOSIS — S20219A Contusion of unspecified front wall of thorax, initial encounter: Secondary | ICD-10-CM | POA: Diagnosis present

## 2018-08-25 DIAGNOSIS — Z8711 Personal history of peptic ulcer disease: Secondary | ICD-10-CM

## 2018-08-25 DIAGNOSIS — J189 Pneumonia, unspecified organism: Secondary | ICD-10-CM

## 2018-08-25 DIAGNOSIS — R092 Respiratory arrest: Secondary | ICD-10-CM

## 2018-08-25 DIAGNOSIS — N183 Chronic kidney disease, stage 3 (moderate): Secondary | ICD-10-CM | POA: Diagnosis present

## 2018-08-25 DIAGNOSIS — R945 Abnormal results of liver function studies: Secondary | ICD-10-CM

## 2018-08-25 DIAGNOSIS — I130 Hypertensive heart and chronic kidney disease with heart failure and stage 1 through stage 4 chronic kidney disease, or unspecified chronic kidney disease: Secondary | ICD-10-CM | POA: Diagnosis present

## 2018-08-25 DIAGNOSIS — E785 Hyperlipidemia, unspecified: Secondary | ICD-10-CM | POA: Diagnosis present

## 2018-08-25 DIAGNOSIS — Z833 Family history of diabetes mellitus: Secondary | ICD-10-CM

## 2018-08-25 DIAGNOSIS — R739 Hyperglycemia, unspecified: Secondary | ICD-10-CM | POA: Diagnosis present

## 2018-08-25 DIAGNOSIS — R778 Other specified abnormalities of plasma proteins: Secondary | ICD-10-CM

## 2018-08-25 DIAGNOSIS — Z8249 Family history of ischemic heart disease and other diseases of the circulatory system: Secondary | ICD-10-CM

## 2018-08-25 DIAGNOSIS — I10 Essential (primary) hypertension: Secondary | ICD-10-CM | POA: Diagnosis present

## 2018-08-25 DIAGNOSIS — Z79899 Other long term (current) drug therapy: Secondary | ICD-10-CM

## 2018-08-25 DIAGNOSIS — Z880 Allergy status to penicillin: Secondary | ICD-10-CM

## 2018-08-25 DIAGNOSIS — I509 Heart failure, unspecified: Secondary | ICD-10-CM | POA: Diagnosis present

## 2018-08-25 DIAGNOSIS — J9811 Atelectasis: Secondary | ICD-10-CM | POA: Diagnosis present

## 2018-08-25 DIAGNOSIS — Z800 Family history of malignant neoplasm of digestive organs: Secondary | ICD-10-CM

## 2018-08-25 DIAGNOSIS — F17210 Nicotine dependence, cigarettes, uncomplicated: Secondary | ICD-10-CM | POA: Diagnosis present

## 2018-08-25 DIAGNOSIS — I469 Cardiac arrest, cause unspecified: Secondary | ICD-10-CM

## 2018-08-25 DIAGNOSIS — I700 Atherosclerosis of aorta: Secondary | ICD-10-CM | POA: Diagnosis present

## 2018-08-25 DIAGNOSIS — Z8669 Personal history of other diseases of the nervous system and sense organs: Secondary | ICD-10-CM

## 2018-08-25 DIAGNOSIS — F319 Bipolar disorder, unspecified: Secondary | ICD-10-CM | POA: Diagnosis present

## 2018-08-25 DIAGNOSIS — F1410 Cocaine abuse, uncomplicated: Secondary | ICD-10-CM | POA: Diagnosis present

## 2018-08-25 DIAGNOSIS — Z886 Allergy status to analgesic agent status: Secondary | ICD-10-CM

## 2018-08-25 DIAGNOSIS — R579 Shock, unspecified: Secondary | ICD-10-CM | POA: Diagnosis present

## 2018-08-25 DIAGNOSIS — Z6834 Body mass index (BMI) 34.0-34.9, adult: Secondary | ICD-10-CM

## 2018-08-25 DIAGNOSIS — R7989 Other specified abnormal findings of blood chemistry: Secondary | ICD-10-CM | POA: Diagnosis present

## 2018-08-25 DIAGNOSIS — N179 Acute kidney failure, unspecified: Secondary | ICD-10-CM | POA: Diagnosis present

## 2018-08-25 DIAGNOSIS — J45909 Unspecified asthma, uncomplicated: Secondary | ICD-10-CM | POA: Diagnosis present

## 2018-08-25 HISTORY — DX: Hyperlipidemia, unspecified: E78.5

## 2018-08-25 LAB — CBC WITH DIFFERENTIAL/PLATELET
Abs Immature Granulocytes: 0.63 10*3/uL — ABNORMAL HIGH (ref 0.00–0.07)
Basophils Absolute: 0.1 10*3/uL (ref 0.0–0.1)
Basophils Relative: 1 %
Eosinophils Absolute: 0.2 10*3/uL (ref 0.0–0.5)
Eosinophils Relative: 2 %
HCT: 41 % (ref 36.0–46.0)
Hemoglobin: 12.5 g/dL (ref 12.0–15.0)
Immature Granulocytes: 5 %
Lymphocytes Relative: 36 %
Lymphs Abs: 4.3 10*3/uL — ABNORMAL HIGH (ref 0.7–4.0)
MCH: 32.6 pg (ref 26.0–34.0)
MCHC: 30.5 g/dL (ref 30.0–36.0)
MCV: 107 fL — ABNORMAL HIGH (ref 80.0–100.0)
Monocytes Absolute: 0.3 10*3/uL (ref 0.1–1.0)
Monocytes Relative: 3 %
Neutro Abs: 6.4 10*3/uL (ref 1.7–7.7)
Neutrophils Relative %: 53 %
Platelets: 257 10*3/uL (ref 150–400)
RBC: 3.83 MIL/uL — ABNORMAL LOW (ref 3.87–5.11)
RDW: 13.1 % (ref 11.5–15.5)
WBC: 11.9 10*3/uL — ABNORMAL HIGH (ref 4.0–10.5)
nRBC: 0.8 % — ABNORMAL HIGH (ref 0.0–0.2)

## 2018-08-25 LAB — I-STAT CG4 LACTIC ACID, ED
Lactic Acid, Venous: 10.53 mmol/L (ref 0.5–1.9)
Lactic Acid, Venous: 2.21 mmol/L (ref 0.5–1.9)

## 2018-08-25 LAB — COMPREHENSIVE METABOLIC PANEL
ALT: 151 U/L — ABNORMAL HIGH (ref 0–44)
AST: 215 U/L — ABNORMAL HIGH (ref 15–41)
Albumin: 3.8 g/dL (ref 3.5–5.0)
Alkaline Phosphatase: 113 U/L (ref 38–126)
Anion gap: 20 — ABNORMAL HIGH (ref 5–15)
BUN: 10 mg/dL (ref 6–20)
CO2: 19 mmol/L — ABNORMAL LOW (ref 22–32)
Calcium: 8.6 mg/dL — ABNORMAL LOW (ref 8.9–10.3)
Chloride: 100 mmol/L (ref 98–111)
Creatinine, Ser: 1.46 mg/dL — ABNORMAL HIGH (ref 0.44–1.00)
GFR calc Af Amer: 47 mL/min — ABNORMAL LOW (ref >60)
GFR calc non Af Amer: 41 mL/min — ABNORMAL LOW (ref >60)
Glucose, Bld: 251 mg/dL — ABNORMAL HIGH (ref 70–99)
Potassium: 4 mmol/L (ref 3.5–5.1)
Sodium: 139 mmol/L (ref 135–145)
Total Bilirubin: 0.3 mg/dL (ref 0.3–1.2)
Total Protein: 7.1 g/dL (ref 6.5–8.1)

## 2018-08-25 LAB — I-STAT CHEM 8, ED
BUN: 10 mg/dL (ref 6–20)
Calcium, Ion: 1.09 mmol/L — ABNORMAL LOW (ref 1.15–1.40)
Chloride: 101 mmol/L (ref 98–111)
Creatinine, Ser: 1.4 mg/dL — ABNORMAL HIGH (ref 0.44–1.00)
Glucose, Bld: 245 mg/dL — ABNORMAL HIGH (ref 70–99)
HCT: 42 % (ref 36.0–46.0)
Hemoglobin: 14.3 g/dL (ref 12.0–15.0)
Potassium: 4 mmol/L (ref 3.5–5.1)
Sodium: 137 mmol/L (ref 135–145)
TCO2: 20 mmol/L — ABNORMAL LOW (ref 22–32)

## 2018-08-25 LAB — URINALYSIS, ROUTINE W REFLEX MICROSCOPIC
Bacteria, UA: NONE SEEN
Bilirubin Urine: NEGATIVE
Glucose, UA: NEGATIVE mg/dL
Ketones, ur: NEGATIVE mg/dL
Leukocytes, UA: NEGATIVE
Nitrite: NEGATIVE
Protein, ur: NEGATIVE mg/dL
Specific Gravity, Urine: 1.017 (ref 1.005–1.030)
pH: 7 (ref 5.0–8.0)

## 2018-08-25 LAB — RAPID URINE DRUG SCREEN, HOSP PERFORMED
Amphetamines: NOT DETECTED
Barbiturates: NOT DETECTED
Benzodiazepines: NOT DETECTED
Cocaine: POSITIVE — AB
Opiates: NOT DETECTED
Tetrahydrocannabinol: NOT DETECTED

## 2018-08-25 LAB — I-STAT ARTERIAL BLOOD GAS, ED
Acid-base deficit: 1 mmol/L (ref 0.0–2.0)
Bicarbonate: 24.1 mmol/L (ref 20.0–28.0)
O2 Saturation: 96 %
TCO2: 25 mmol/L (ref 22–32)
pCO2 arterial: 43 mmHg (ref 32.0–48.0)
pH, Arterial: 7.357 (ref 7.350–7.450)
pO2, Arterial: 85 mmHg (ref 83.0–108.0)

## 2018-08-25 LAB — I-STAT TROPONIN, ED: Troponin i, poc: 0.51 ng/mL (ref 0.00–0.08)

## 2018-08-25 MED ORDER — ALBUTEROL SULFATE (2.5 MG/3ML) 0.083% IN NEBU
5.0000 mg | INHALATION_SOLUTION | Freq: Once | RESPIRATORY_TRACT | Status: AC
  Administered 2018-08-25: 5 mg via RESPIRATORY_TRACT
  Filled 2018-08-25: qty 6

## 2018-08-25 MED ORDER — SODIUM CHLORIDE 0.9 % IV BOLUS
500.0000 mL | Freq: Once | INTRAVENOUS | Status: AC
  Administered 2018-08-25: 500 mL via INTRAVENOUS

## 2018-08-25 MED ORDER — LEVOFLOXACIN IN D5W 750 MG/150ML IV SOLN
750.0000 mg | Freq: Once | INTRAVENOUS | Status: AC
  Administered 2018-08-25: 750 mg via INTRAVENOUS
  Filled 2018-08-25: qty 150

## 2018-08-25 MED ORDER — METHYLPREDNISOLONE SODIUM SUCC 125 MG IJ SOLR
125.0000 mg | Freq: Once | INTRAMUSCULAR | Status: AC
  Administered 2018-08-25: 125 mg via INTRAVENOUS
  Filled 2018-08-25: qty 2

## 2018-08-25 MED ORDER — IOPAMIDOL (ISOVUE-370) INJECTION 76%
INTRAVENOUS | Status: AC
  Administered 2018-08-25: 100 mL
  Filled 2018-08-25: qty 100

## 2018-08-25 MED ORDER — LEVOFLOXACIN IN D5W 750 MG/150ML IV SOLN: 750.0000 mg | INTRAVENOUS | Status: DC

## 2018-08-25 MED ORDER — ALPRAZOLAM 0.25 MG PO TABS
0.2500 mg | ORAL_TABLET | Freq: Once | ORAL | Status: AC
  Administered 2018-08-25: 0.25 mg via ORAL
  Filled 2018-08-25: qty 1

## 2018-08-25 NOTE — ED Notes (Signed)
Attempted to call report advised ER nurse that  "charge nurse has not approved yet" .

## 2018-08-25 NOTE — ED Triage Notes (Signed)
Pt arrives via EMS from home with reports of respiratory distress. EMS reports pt went PEA and did CPR for 5 min. Pt arrives restless with central chest pain, pulling at NRB. Pt O2 sats in 70s on RA.

## 2018-08-25 NOTE — Progress Notes (Signed)
Pharmacy Antibiotic Note  Amanda [name] is a 53 y.o. female admitted on 08/25/2018 with sepsis.  Pharmacy has been consulted for Levofloxacin dosing.  Plan: Levofloxacin 750 mg IV q24hr Will monitor renal function and cultures and sensitivities.  Height: 5\' 8"  (172.7 cm) Weight: 225 lb (102.1 kg) IBW/kg (Calculated) : 63.9  Temp (24hrs), Avg:98.9 F (37.2 C), Min:98.9 F (37.2 C), Max:98.9 F (37.2 C)  Recent Labs  Lab 08/25/18 1619 08/25/18 1627 08/25/18 1628  WBC 11.9*  --   --   CREATININE 1.46* 1.40*  --   LATICACIDVEN  --   --  10.53*    Estimated Creatinine Clearance: 58.1 mL/min (A) (by C-G formula based on SCr of 1.4 mg/dL (H)).    Allergies  Allergen Reactions  . Penicillins Anaphylaxis  . Aspirin Rash    Antimicrobials this admission: Levofloxacin 01/02 >>   Thank you for allowing pharmacy to be a part of this patient's care.  Jeanella Cara, PharmD, [facility] Clinical Pharmacist Please see AMION for all Pharmacists' Contact Phone Numbers 08/25/2018, 6:51 PM

## 2018-08-25 NOTE — ED Provider Notes (Signed)
[facility] EMERGENCY DEPARTMENT Provider Note   CSN: [id number] Arrival date & time: 08/25/18  1557     History   Chief Complaint Chief Complaint  Patient presents with  . Respiratory Arrest    HPI Amanda [name] is a 53 y.o. female.  Patient is a middle-aged female who comes in as a Amanda [name] following a respiratory/cardiac arrest.  EMS was called due to respiratory distress.  On scene, they did find an inhaler and presume that she has a history of asthma.  She was having severe shortness of breath.  They started a nebulizer treatment but patient immediately became unresponsive.  She was ventilated with a bag valve mask and went into PEA rhythm.  CPR was performed for about 5 minutes.  She then became more responsive and regained a pulse.  She was maintained on nonrebreather mask.  She gradually became more alert in route.  An IO was placed in the left proximal tibia.  She was given 1 mg of epinephrine.  She was also paced briefly by EMS.  She currently is awake and answering questions but confused.  History is limited due to this.     No past medical history on file.  There are no active problems to display for this patient.    The histories are not reviewed yet. Please review them in the "History" navigator section and refresh this SmartLink.   OB History   No obstetric history on file.      Home Medications    Prior to Admission medications   Medication Sig Start Date End Date Taking? Authorizing Provider  albuterol (VENTOLIN HFA) 108 (90 Base) MCG/ACT inhaler Inhale 2 puffs into the lungs every 6 (six) hours as needed for wheezing or shortness of breath.   Yes [provider]  amLODipine (NORVASC) 5 MG tablet Take 5 mg by mouth daily.   Yes [provider]  atorvastatin (LIPITOR) 20 MG tablet Take 20 mg by mouth at bedtime.   Yes [provider]  buPROPion (WELLBUTRIN SR) 150 MG 12 hr tablet Take 150 mg by mouth daily.    Yes [provider]  cetirizine (ZYRTEC) 10 MG tablet Take 10 mg by mouth daily.   Yes [provider]  cloNIDine (CATAPRES) 0.1 MG tablet Take 0.1 mg by mouth at bedtime.   Yes [provider]  cyclobenzaprine (FLEXERIL) 10 MG tablet Take 10 mg by mouth 2 (two) times daily as needed for muscle spasms.   Yes [provider]  dexlansoprazole (DEXILANT) 60 MG capsule Take 60 mg by mouth daily.   Yes [provider]  fluticasone (FLONASE) 50 MCG/ACT nasal spray Place 2 sprays into both nostrils daily as needed for allergies or rhinitis.   Yes [provider]  haloperidol (HALDOL) 5 MG tablet Take 7.5 mg by mouth at bedtime.   Yes [provider]  hydrochlorothiazide (HYDRODIURIL) 25 MG tablet Take 25 mg by mouth daily.   Yes [provider]  hydrOXYzine (ATARAX/VISTARIL) 50 MG tablet Take 50 mg by mouth 3 (three) times daily as needed for anxiety.   Yes [provider]  meloxicam (MOBIC) 15 MG tablet Take 15 mg by mouth at bedtime.   Yes [provider]  metoprolol tartrate (LOPRESSOR) 100 MG tablet Take 100 mg by mouth 2 (two) times daily.   Yes [provider]  traZODone (DESYREL) 100 MG tablet Take 300 mg by mouth at bedtime.   Yes [provider]  Vitamin D, Ergocalciferol, (DRISDOL) 1.25 MG (50000 UT) CAPS capsule Take 50,000 Units by mouth every 7 (seven) days.   Yes [provider]    Family History No family history on file.  Social History Social History   Tobacco Use  . Smoking status: Not on file  Substance Use Topics  . Alcohol use: Not on file  . Drug use: Not on file     Allergies   Penicillins and Aspirin   Review of Systems Review of Systems  Unable to perform ROS: Mental status change     Physical Exam Updated Vital Signs BP (!) 159/93   Pulse (!) 109   Temp 98.9 F (37.2 C) (Oral)   Resp (!) 22   Ht 5\' 8"  (1.727 m)   Wt 102.1 kg   SpO2  100%   BMI 34.21 kg/m   Physical Exam Constitutional:      General: She is in acute distress.     Appearance: She is well-developed. She is diaphoretic.  HENT:     Head: Normocephalic and atraumatic.  Eyes:     Pupils: Pupils are equal, round, and reactive to light.  Neck:     Musculoskeletal: Normal range of motion and neck supple.  Cardiovascular:     Rate and Rhythm: Normal rate and regular rhythm.     Heart sounds: Normal heart sounds.  Pulmonary:     Effort: Pulmonary effort is normal. No respiratory distress.     Breath sounds: Rhonchi present. No wheezing or rales.  Chest:     Chest wall: No tenderness.  Abdominal:     General: Bowel sounds are normal.     Palpations: Abdomen is soft.     Tenderness: There is no abdominal tenderness. There is no guarding or rebound.  Musculoskeletal: Normal range of motion.  Lymphadenopathy:     Cervical: No cervical adenopathy.  Skin:    General: Skin is warm.     Findings: No rash.  Neurological:     Mental Status: She is alert.     Comments: Patient is awake and alert.  She can tell me her name.  She is moving all extremities symmetrically.  She is otherwise confused.      ED Treatments / Results  Labs (all labs ordered are listed, but only abnormal results are displayed) Labs Reviewed  COMPREHENSIVE METABOLIC PANEL - Abnormal; Notable for the following components:      Result Value   CO2 19 (*)    Glucose, Bld 251 (*)    Creatinine, Ser 1.46 (*)    Calcium 8.6 (*)    AST 215 (*)    ALT 151 (*)    GFR calc non Af Amer 41 (*)    GFR calc Af Amer 47 (*)    Anion gap 20 (*)    All other components within normal limits  CBC WITH DIFFERENTIAL/PLATELET - Abnormal; Notable for the following components:   WBC 11.9 (*)    RBC 3.83 (*)    MCV 107.0 (*)    nRBC 0.8 (*)    Lymphs Abs 4.3 (*)    Abs Immature Granulocytes 0.63 (*)    All other components within normal limits  URINALYSIS, ROUTINE W REFLEX MICROSCOPIC -  Abnormal; Notable for the following components:   Color, Urine COLORLESS (*)    Hgb urine dipstick LARGE (*)    All other components within normal limits  I-STAT CHEM 8, ED - Abnormal; Notable for the following components:  Creatinine, Ser 1.40 (*)    Glucose, Bld 245 (*)    Calcium, Ion 1.09 (*)    TCO2 20 (*)    All other components within normal limits  I-STAT TROPONIN, ED - Abnormal; Notable for the following components:   Troponin i, poc 0.51 (*)    All other components within normal limits  I-STAT CG4 LACTIC ACID, ED - Abnormal; Notable for the following components:   Lactic Acid, Venous 10.53 (*)    All other components within normal limits  I-STAT CG4 LACTIC ACID, ED - Abnormal; Notable for the following components:   Lactic Acid, Venous 2.21 (*)    All other components within normal limits  CULTURE, BLOOD (ROUTINE X 2)  CULTURE, BLOOD (ROUTINE X 2)  BLOOD GAS, ARTERIAL  RAPID URINE DRUG SCREEN, HOSP PERFORMED  I-STAT ARTERIAL BLOOD GAS, ED    EKG EKG Interpretation  Date/Time:  Thursday August 25 2018 15:57:47 EST Ventricular Rate:  114 PR Interval:    QRS Duration: 79 QT Interval:  326 QTC Calculation: 449 R Axis:   65 Text Interpretation:  Sinus tachycardia Probable left atrial enlargement ST depression, probably rate related Baseline wander in lead(s) II III aVF No old tracing to compare Confirmed by Rolan BuccoBelfi, Reynold Mantell [phone]([id number]) on 08/25/2018 4:19:52 PM   Radiology Ct Head Wo Contrast  Result Date: 08/25/2018 CLINICAL DATA:  Altered mental status.  Post CPR. EXAM: CT HEAD WITHOUT CONTRAST TECHNIQUE: Contiguous axial images were obtained from the base of the skull through the vertex without intravenous contrast. COMPARISON:  None. FINDINGS: Brain: No evidence of acute infarction, hemorrhage, hydrocephalus, extra-axial collection or mass lesion/mass effect. Vascular: No hyperdense vessel or unexpected calcification. Skull: Normal. Negative for fracture or focal lesion.  Sinuses/Orbits: Near complete opacification of the left mastoid air cells. No osseous destruction. The right mastoid air cells and paranasal sinuses are clear. The orbits are unremarkable. Other: None. IMPRESSION: 1.  No acute intracranial abnormality. 2. Near complete opacification of the left mastoid air cells. Electronically Signed   By: Obie DredgeWilliam T Derry M.D.   On: 08/25/2018 18:22   Ct Angio Chest Pe W/cm &/or Wo Cm  Result Date: 08/25/2018 CLINICAL DATA:  Altered level of consciousness. Respiratory distress. Patient underwent 5 minutes of CPR in route to the hospital. Central chest pain. EXAM: CT ANGIOGRAPHY CHEST WITH CONTRAST TECHNIQUE: Multidetector CT imaging of the chest was performed using the standard protocol during bolus administration of intravenous contrast. Multiplanar CT image reconstructions and MIPs were obtained to evaluate the vascular anatomy. CONTRAST:  100mL ISOVUE-370 IOPAMIDOL (ISOVUE-370) INJECTION 76% COMPARISON:  Current chest radiographs. FINDINGS: Cardiovascular: Satisfactory opacification of the pulmonary arteries to the segmental level. No evidence of pulmonary embolism. Normal heart size. No pericardial effusion. No coronary artery calcifications. Great vessels are normal in caliber. No aortic dissection. Minimal aortic atherosclerosis along the arch. Mediastinum/Nodes: No enlarged mediastinal, hilar, or axillary lymph nodes. Thyroid gland, trachea, and esophagus demonstrate no significant findings. Lungs/Pleura: There are patchy areas of predominantly peribronchovascular ground-glass airspace opacity in the upper lobes, right greater than left. There is additional peribronchovascular airspace opacity in the posterior lower lobes, right slightly greater than left. Remainder of the lungs is clear. Minimal right pleural effusion. No pneumothorax. Upper Abdomen: No acute abnormality. Musculoskeletal: No chest wall abnormality. No acute or significant osseous findings. Review of the  MIP images confirms the above findings. IMPRESSION: 1. No evidence of a pulmonary embolism. 2. Bilateral areas of peribronchovascular ground-glass opacity in the upper lobes and lower  lobes, right greater than left. A component of the lower lobe opacity is likely atelectasis. Findings are consistent with multifocal pneumonia. Asymmetric pulmonary edema is felt less likely but not excluded. Aortic Atherosclerosis (ICD10-I70.0). Electronically Signed   By: Amie Portland M.D.   On: 08/25/2018 18:21   Dg Chest Portable 1 View  Result Date: 08/25/2018 CLINICAL DATA:  Shortness of breath. EXAM: PORTABLE CHEST 1 VIEW COMPARISON:  None. FINDINGS: Provided date of birth 08/24/1876. 1559 hours. The heart size is at the upper limits of normal for portable technique. The pulmonary vasculature is ill-defined and there are diffuse bilateral pulmonary opacities most consistent with acute congestive heart failure. No consolidation, pneumothorax or significant pleural effusion identified. The bones appear unremarkable. External pacer and telemetry leads are in place. IMPRESSION: Probable congestive heart failure. No comparison studies are currently available. Provided demographics are incorrect. If corrected and prior studies can be located, this report can be addended. Electronically Signed   By: Carey Bullocks M.D.   On: 08/25/2018 16:16    Procedures Procedures (including critical care time)  Medications Ordered in ED Medications  levofloxacin (LEVAQUIN) IVPB 750 mg (750 mg Intravenous New Bag/Given 08/25/18 1859)  levofloxacin (LEVAQUIN) IVPB 750 mg (750 mg Intravenous Not Given 08/25/18 1907)  sodium chloride 0.9 % bolus 500 mL (0 mLs Intravenous Stopped 08/25/18 1838)  albuterol (PROVENTIL) (2.5 MG/3ML) 0.083% nebulizer solution 5 mg (5 mg Nebulization Given 08/25/18 1707)  methylPREDNISolone sodium succinate (SOLU-MEDROL) 125 mg/2 mL injection 125 mg (125 mg Intravenous Given 08/25/18 1710)  iopamidol (ISOVUE-370) 76 %  injection (100 mLs  Contrast Given 08/25/18 1736)     Initial Impression / Assessment and Plan / ED Course  I have reviewed the triage vital signs and the nursing notes.  Pertinent labs & imaging results that were available during my care of the patient were reviewed by me and considered in my medical decision making (see chart for details).     Patient presents after respiratory/cardiac arrest.  She had CPR done about 5 minutes.  She was given 1 mg epinephrine.  On arrival she was breathing on her own and awake and talking although confused.  During the ED course she became much more alert.  Her diaphoresis resolved.  She was more comfortable.  She is maintaining oxygen saturations with a nasal cannula at 2 L/min.  She does have some soreness across her chest which is reproducible and likely from the chest compressions.  Her troponin is mildly elevated but she does not have any ischemic changes noted on EKG.  She did have a CT of her chest which shows no evidence of PE although there is suggestions of multifocal pneumonia.  She was started on IV antibiotics.  Her lactate initially was markedly elevated at 10 but this has improved with IV fluids to 2.  I discussed this with the intensivist who feels that with improvement of the lactate, she can be admitted to the hospitalist service.  I spoke with Dr. Toniann Fail who will admit the patient for further treatment.  CRITICAL CARE Performed by: Rolan Bucco Total critical care time: 60 minutes Critical care time was exclusive of separately billable procedures and treating other patients. Critical care was necessary to treat or prevent imminent or life-threatening deterioration. Critical care was time spent personally by me on the following activities: development of treatment plan with patient and/or surrogate as well as nursing, discussions with consultants, evaluation of patient's response to treatment, examination of patient, obtaining history from  patient or surrogate, ordering and performing treatments and interventions, ordering and review of laboratory studies, ordering and review of radiographic studies, pulse oximetry and re-evaluation of patient's condition.   Final Clinical Impressions(s) / ED Diagnoses   Final diagnoses:  Cardiac arrest (HCC)  Respiratory arrest (HCC)  Contusion of chest wall, unspecified laterality, initial encounter  Elevated troponin  Community acquired pneumonia, unspecified laterality    ED Discharge Orders    None       Rolan Bucco, MD 08/25/18 1933

## 2018-08-26 ENCOUNTER — Encounter (HOSPITAL_COMMUNITY): Payer: Self-pay | Admitting: Internal Medicine

## 2018-08-26 ENCOUNTER — Other Ambulatory Visit (HOSPITAL_COMMUNITY): Payer: Self-pay

## 2018-08-26 DIAGNOSIS — F1410 Cocaine abuse, uncomplicated: Secondary | ICD-10-CM | POA: Diagnosis not present

## 2018-08-26 DIAGNOSIS — I10 Essential (primary) hypertension: Secondary | ICD-10-CM | POA: Diagnosis present

## 2018-08-26 DIAGNOSIS — S20219A Contusion of unspecified front wall of thorax, initial encounter: Secondary | ICD-10-CM

## 2018-08-26 DIAGNOSIS — F319 Bipolar disorder, unspecified: Secondary | ICD-10-CM | POA: Diagnosis present

## 2018-08-26 DIAGNOSIS — I214 Non-ST elevation (NSTEMI) myocardial infarction: Secondary | ICD-10-CM | POA: Diagnosis not present

## 2018-08-26 DIAGNOSIS — R945 Abnormal results of liver function studies: Secondary | ICD-10-CM

## 2018-08-26 DIAGNOSIS — R739 Hyperglycemia, unspecified: Secondary | ICD-10-CM | POA: Diagnosis present

## 2018-08-26 DIAGNOSIS — J9601 Acute respiratory failure with hypoxia: Secondary | ICD-10-CM

## 2018-08-26 DIAGNOSIS — R778 Other specified abnormalities of plasma proteins: Secondary | ICD-10-CM

## 2018-08-26 DIAGNOSIS — R092 Respiratory arrest: Secondary | ICD-10-CM

## 2018-08-26 DIAGNOSIS — R7989 Other specified abnormal findings of blood chemistry: Secondary | ICD-10-CM | POA: Diagnosis not present

## 2018-08-26 DIAGNOSIS — Z8669 Personal history of other diseases of the nervous system and sense organs: Secondary | ICD-10-CM

## 2018-08-26 DIAGNOSIS — I469 Cardiac arrest, cause unspecified: Secondary | ICD-10-CM

## 2018-08-26 DIAGNOSIS — J189 Pneumonia, unspecified organism: Secondary | ICD-10-CM | POA: Diagnosis not present

## 2018-08-26 DIAGNOSIS — E785 Hyperlipidemia, unspecified: Secondary | ICD-10-CM | POA: Diagnosis present

## 2018-08-26 LAB — TROPONIN I
Troponin I: 0.3 ng/mL (ref <0.03)
Troponin I: 0.24 ng/mL (ref <0.03)
Troponin I: 0.26 ng/mL (ref <0.03)

## 2018-08-26 LAB — BASIC METABOLIC PANEL
Anion gap: 14 (ref 5–15)
BUN: 10 mg/dL (ref 6–20)
CO2: 23 mmol/L (ref 22–32)
Calcium: 8.8 mg/dL — ABNORMAL LOW (ref 8.9–10.3)
Chloride: 100 mmol/L (ref 98–111)
Creatinine, Ser: 1.18 mg/dL — ABNORMAL HIGH (ref 0.44–1.00)
GFR calc Af Amer: >60 mL/min (ref >60)
GFR calc non Af Amer: 53 mL/min — ABNORMAL LOW (ref >60)
Glucose, Bld: 136 mg/dL — ABNORMAL HIGH (ref 70–99)
Potassium: 3.6 mmol/L (ref 3.5–5.1)
Sodium: 137 mmol/L (ref 135–145)

## 2018-08-26 LAB — GLUCOSE, CAPILLARY
Glucose-Capillary: 131 mg/dL — ABNORMAL HIGH (ref 70–99)
Glucose-Capillary: 136 mg/dL — ABNORMAL HIGH (ref 70–99)
Glucose-Capillary: 141 mg/dL — ABNORMAL HIGH (ref 70–99)
Glucose-Capillary: 132 mg/dL — ABNORMAL HIGH (ref 70–99)

## 2018-08-26 LAB — HEMOGLOBIN A1C
Hgb A1c MFr Bld: 4.3 % — ABNORMAL LOW (ref 4.8–5.6)
Mean Plasma Glucose: 76.71 mg/dL

## 2018-08-26 LAB — STREP PNEUMONIAE URINARY ANTIGEN: Strep Pneumo Urinary Antigen: NEGATIVE

## 2018-08-26 LAB — MRSA PCR SCREENING: MRSA by PCR: NEGATIVE

## 2018-08-26 LAB — PROCALCITONIN: Procalcitonin: 1.46 ng/mL

## 2018-08-26 LAB — HEPATIC FUNCTION PANEL
ALT: 125 U/L — ABNORMAL HIGH (ref 0–44)
AST: 97 U/L — ABNORMAL HIGH (ref 15–41)
Albumin: 3.8 g/dL (ref 3.5–5.0)
Alkaline Phosphatase: 97 U/L (ref 38–126)
Bilirubin, Direct: <0.1 mg/dL (ref 0.0–0.2)
Total Bilirubin: 0.5 mg/dL (ref 0.3–1.2)
Total Protein: 7.2 g/dL (ref 6.5–8.1)

## 2018-08-26 LAB — BRAIN NATRIURETIC PEPTIDE: B Natriuretic Peptide: 527.1 pg/mL — ABNORMAL HIGH (ref 0.0–100.0)

## 2018-08-26 LAB — TSH: TSH: 0.483 u[IU]/mL (ref 0.350–4.500)

## 2018-08-26 LAB — LACTIC ACID, PLASMA: Lactic Acid, Venous: 2.7 mmol/L (ref 0.5–1.9)

## 2018-08-26 LAB — MAGNESIUM: Magnesium: 1.6 mg/dL — ABNORMAL LOW (ref 1.7–2.4)

## 2018-08-26 LAB — HIV ANTIBODY (ROUTINE TESTING W REFLEX): HIV Screen 4th Generation wRfx: NONREACTIVE

## 2018-08-26 MED ORDER — ACETAMINOPHEN 325 MG PO TABS
650.0000 mg | ORAL_TABLET | Freq: Four times a day (QID) | ORAL | Status: DC | PRN
  Administered 2018-08-26 – 2018-08-28 (×3): 650 mg via ORAL
  Filled 2018-08-26 (×3): qty 2

## 2018-08-26 MED ORDER — TRAZODONE HCL 150 MG PO TABS
300.0000 mg | ORAL_TABLET | Freq: Every day | ORAL | Status: DC
  Administered 2018-08-26 – 2018-08-29 (×5): 300 mg via ORAL
  Filled 2018-08-26 (×5): qty 2

## 2018-08-26 MED ORDER — HYDRALAZINE HCL 20 MG/ML IJ SOLN: 5.0000 mg | INTRAMUSCULAR | Status: DC | PRN

## 2018-08-26 MED ORDER — HALOPERIDOL 1 MG PO TABS
7.5000 mg | ORAL_TABLET | Freq: Every day | ORAL | Status: DC
  Filled 2018-08-26: qty 1

## 2018-08-26 MED ORDER — FLUTICASONE PROPIONATE 50 MCG/ACT NA SUSP
2.0000 | Freq: Every day | NASAL | Status: DC | PRN
  Administered 2018-08-27: 2 via NASAL

## 2018-08-26 MED ORDER — TOPIRAMATE 100 MG PO TABS
200.0000 mg | ORAL_TABLET | Freq: Two times a day (BID) | ORAL | Status: DC
  Administered 2018-08-26 – 2018-08-29 (×8): 200 mg via ORAL
  Filled 2018-08-26 (×11): qty 2

## 2018-08-26 MED ORDER — CLONIDINE HCL 0.1 MG PO TABS
0.1000 mg | ORAL_TABLET | Freq: Every day | ORAL | Status: DC
  Administered 2018-08-26 – 2018-08-29 (×5): 0.1 mg via ORAL
  Filled 2018-08-26 (×5): qty 1

## 2018-08-26 MED ORDER — AMLODIPINE BESYLATE 5 MG PO TABS
5.0000 mg | ORAL_TABLET | Freq: Every day | ORAL | Status: DC
  Administered 2018-08-26 – 2018-08-29 (×4): 5 mg via ORAL
  Filled 2018-08-26 (×5): qty 1

## 2018-08-26 MED ORDER — PREGABALIN 75 MG PO CAPS: 75.0000 mg | ORAL_CAPSULE | Freq: Two times a day (BID) | ORAL | Status: DC | PRN

## 2018-08-26 MED ORDER — BUPROPION HCL ER (SR) 150 MG PO TB12
150.0000 mg | ORAL_TABLET | Freq: Every day | ORAL | Status: DC
  Administered 2018-08-26 – 2018-08-29 (×4): 150 mg via ORAL
  Filled 2018-08-26 (×5): qty 1

## 2018-08-26 MED ORDER — INSULIN ASPART 100 UNIT/ML ~~LOC~~ SOLN
0.0000 [IU] | Freq: Three times a day (TID) | SUBCUTANEOUS | Status: DC
  Administered 2018-08-26 (×3): 1 [IU] via SUBCUTANEOUS

## 2018-08-26 MED ORDER — PANTOPRAZOLE SODIUM 40 MG PO TBEC
40.0000 mg | DELAYED_RELEASE_TABLET | Freq: Every day | ORAL | Status: DC
  Administered 2018-08-26 – 2018-08-29 (×4): 40 mg via ORAL
  Filled 2018-08-26 (×5): qty 1

## 2018-08-26 MED ORDER — LEVOFLOXACIN 750 MG PO TABS
750.0000 mg | ORAL_TABLET | Freq: Every day | ORAL | Status: DC
  Administered 2018-08-26 – 2018-08-29 (×4): 750 mg via ORAL
  Filled 2018-08-26 (×4): qty 1

## 2018-08-26 MED ORDER — CYCLOBENZAPRINE HCL 10 MG PO TABS
10.0000 mg | ORAL_TABLET | Freq: Two times a day (BID) | ORAL | Status: DC | PRN
  Administered 2018-08-26 – 2018-08-27 (×3): 10 mg via ORAL
  Filled 2018-08-26 (×3): qty 1

## 2018-08-26 MED ORDER — ONDANSETRON HCL 4 MG/2ML IJ SOLN: 4.0000 mg | Freq: Four times a day (QID) | INTRAMUSCULAR | Status: DC | PRN

## 2018-08-26 MED ORDER — HYDROXYZINE HCL 25 MG PO TABS: 50.0000 mg | ORAL_TABLET | Freq: Three times a day (TID) | ORAL | Status: DC | PRN

## 2018-08-26 MED ORDER — ACETAMINOPHEN 650 MG RE SUPP: 650.0000 mg | Freq: Four times a day (QID) | RECTAL | Status: DC | PRN

## 2018-08-26 MED ORDER — ALBUTEROL SULFATE (2.5 MG/3ML) 0.083% IN NEBU: 2.5000 mg | INHALATION_SOLUTION | RESPIRATORY_TRACT | Status: DC | PRN

## 2018-08-26 MED ORDER — ONDANSETRON HCL 4 MG PO TABS: 4.0000 mg | ORAL_TABLET | Freq: Four times a day (QID) | ORAL | Status: DC | PRN

## 2018-08-26 MED ORDER — HYDROCHLOROTHIAZIDE 25 MG PO TABS: 25.0000 mg | ORAL_TABLET | Freq: Every day | ORAL | Status: DC

## 2018-08-26 MED ORDER — FLUTICASONE PROPIONATE 50 MCG/ACT NA SUSP
2.0000 | Freq: Every day | NASAL | Status: DC
  Administered 2018-08-26 – 2018-08-29 (×4): 2 via NASAL
  Filled 2018-08-26: qty 16

## 2018-08-26 MED ORDER — HALOPERIDOL 5 MG PO TABS
7.5000 mg | ORAL_TABLET | Freq: Every day | ORAL | Status: DC
  Administered 2018-08-27 – 2018-08-29 (×4): 7.5 mg via ORAL
  Filled 2018-08-26 (×5): qty 1.5

## 2018-08-26 NOTE — Progress Notes (Signed)
PHARMACIST - PHYSICIAN COMMUNICATION DR:  Allena KatzPatel CONCERNING: Antibiotic IV to Oral Route Change Policy  RECOMMENDATION: This patient is receiving levaquin by the intravenous route.  Based on criteria approved by the Pharmacy and Therapeutics Committee, the antibiotic(s) is/are being converted to the equivalent oral dose form(s).   DESCRIPTION: These criteria include:  Patient being treated for a respiratory tract infection, urinary tract infection, cellulitis or clostridium difficile associated diarrhea if on metronidazole  The patient is not neutropenic and does not exhibit a GI malabsorption state  The patient is eating (either orally or via tube) and/or has been taking other orally administered medications for a least 24 hours  The patient is improving clinically and has a Tmax < 100.5  If you have questions about this conversion, please contact the Pharmacy Department  []   [phone]( [phone] )  [facility] []   [phone]( [phone] )  [facility] [x]   [phone]( [phone] )  [facility] []   [phone]( [phone] )  [facility] []   [phone]( [phone] )  [facility]   NyeMinh Travelle Mcclimans, PharmD, [place]IDP, AAHIVP, CPP Infectious Disease Pharmacist 08/26/2018 9:00 AM

## 2018-08-26 NOTE — Consult Note (Addendum)
Cardiology Consultation:   Patient ID: [name] MRN: [id number]; DOB: 06/18/1966  Admit date: 08/25/2018 Date of Consult: 08/26/2018  Primary Care Provider: Hoy Register, MD Primary Cardiologist: No primary care provider on file. New Dr. Tresa Endo Primary Electrophysiologist:  None    Patient Profile:   Amanda [name] is a 53 y.o. female with a hx of Asthma, bipolar disorder, HLD and HTN and reported heart murmur who is being seen today for the evaluation of PEA arrest at the request of Dr. Allena Katz.  History of Present Illness:   Amanda [name] a 53 year old female with above hx called EMS for chest pressure and increased SOB yesterday.  EMS reported she became unresponsive and given 5 min of CPR for PEA arrest and 1 dose of epinephrine through IO. In ER she was alert and awake and breathing on her own.  She did report 2 days ago of cocaine use.    EKG  I personally reviewed SR and non specific ST changes but no old to compare.   Tele: I personally reviewed. SR   Troponin poc 0.51, troponin I 0.30; 0.24;0.26   BNP 527 Na 137, K+ 3.6, Cr 1.18, AST 97 ALT 125  TSH 0.483 Hgb 12.5, WBC 11.9 and plts 257   Chest CTA :  IMPRESSION: 1. No evidence of a pulmonary embolism. 2. Bilateral areas of peribronchovascular ground-glass opacity in the upper lobes and lower lobes, right greater than left. A component of the lower lobe opacity is likely atelectasis. Findings are consistent with multifocal pneumonia. Asymmetric pulmonary edema is felt less likely but not excluded. Aortic Atherosclerosis (ICD10-I70.0).  Currently comfortable in bed, no complaints, still with wheezes but no chest pain.    Past Medical History:  Diagnosis Date  . Asthma   . Bipolar disorder (HCC)   . Heart murmur   . HLD (hyperlipidemia)   . Hypertension     Past Surgical History:  Procedure Laterality Date  . CHOLECYSTECTOMY    . FOOT SURGERY       Home Medications:  Prior to Admission medications     Medication Sig Start Date End Date Taking? Authorizing Provider  albuterol (VENTOLIN HFA) 108 (90 Base) MCG/ACT inhaler Inhale 2 puffs into the lungs every 6 (six) hours as needed for wheezing or shortness of breath.   Yes [provider]  amLODipine (NORVASC) 5 MG tablet Take 5 mg by mouth daily.   Yes [provider]  atorvastatin (LIPITOR) 20 MG tablet Take 20 mg by mouth at bedtime.   Yes [provider]  buPROPion (WELLBUTRIN SR) 150 MG 12 hr tablet Take 150 mg by mouth daily.   Yes [provider]  cetirizine (ZYRTEC) 10 MG tablet Take 10 mg by mouth daily.   Yes [provider]  cloNIDine (CATAPRES) 0.1 MG tablet Take 0.1 mg by mouth at bedtime as needed (for hot flashes).    Yes [provider]  cyclobenzaprine (FLEXERIL) 10 MG tablet Take 10 mg by mouth 2 (two) times daily as needed for muscle spasms.   Yes [provider]  dexlansoprazole (DEXILANT) 60 MG capsule Take 60 mg by mouth daily.   Yes [provider]  fluticasone (FLONASE) 50 MCG/ACT nasal spray Place 2 sprays into both nostrils daily as needed for allergies or rhinitis.   Yes [provider]  haloperidol (HALDOL) 5 MG tablet Take 7.5 mg by mouth at bedtime.   Yes [provider]  hydrochlorothiazide (HYDRODIURIL) 25 MG tablet  Take 25 mg by mouth daily.   Yes [provider]  hydrOXYzine (ATARAX/VISTARIL) 50 MG tablet Take 50 mg by mouth 3 (three) times daily as needed for anxiety.   Yes [provider]  meloxicam (MOBIC) 15 MG tablet Take 15 mg by mouth at bedtime.   Yes [provider]  metoprolol tartrate (LOPRESSOR) 100 MG tablet Take 100 mg by mouth 2 (two) times daily.   Yes [provider]  olopatadine (PATANOL) 0.1 % ophthalmic solution Place 1-2 drops into both eyes 2 (two) times daily.   Yes [provider]  pregabalin (LYRICA) 75 MG capsule Take 75 mg by mouth 2 (two) times daily as  needed (for nerve pain).   Yes [provider]  SUMAtriptan (IMITREX) 50 MG tablet Take 50 mg by mouth See admin instructions. Take 50 mg by mouth at onset of headache and may repeat once in 2 hours, if no relief   Yes [provider]  topiramate (TOPAMAX) 100 MG tablet Take 200 mg by mouth 2 (two) times daily.   Yes [provider]  traZODone (DESYREL) 100 MG tablet Take 300 mg by mouth at bedtime.   Yes [provider]  Vitamin D, Ergocalciferol, (DRISDOL) 1.25 MG (50000 UT) CAPS capsule Take 50,000 Units by mouth every 7 (seven) days.   Yes [provider]    Inpatient Medications: Scheduled Meds: . amLODipine  5 mg Oral Daily  . buPROPion  150 mg Oral Daily  . cloNIDine  0.1 mg Oral QHS  . haloperidol  7.5 mg Oral QHS  . insulin aspart  0-9 Units Subcutaneous TID WC  . levofloxacin  750 mg Oral q1800  . pantoprazole  40 mg Oral Daily  . topiramate  200 mg Oral BID  . traZODone  300 mg Oral QHS   Continuous Infusions:  PRN Meds: acetaminophen **OR** acetaminophen, albuterol, cyclobenzaprine, fluticasone, hydrALAZINE, hydrOXYzine, ondansetron **OR** ondansetron (ZOFRAN) IV, pregabalin  Allergies:    Allergies  Allergen Reactions  . Penicillins Anaphylaxis    DID THE REACTION INVOLVE: Swelling of the face/tongue/throat, SOB, or low BP? Yes Sudden or severe rash/hives, skin peeling, or the inside of the mouth or nose? Yes Did it require medical treatment? No When did it last happen? Within the past 10 years If all above answers are "NO", may proceed with cephalosporin use.    . Aspirin Rash    Social History:   Social History   Socioeconomic History  . Marital status: Single    Spouse name: Not on file  . Number of children: Not on file  . Years of education: Not on file  . Highest education level: Not on file  Occupational History  . Not on file  Social Needs  . Financial resource strain: Not on file  . Food insecurity:      Worry: Not on file    Inability: Not on file  . Transportation needs:    Medical: Not on file    Non-medical: Not on file  Tobacco Use  . Smoking status: Current Some Day Smoker    Types: Cigarettes  . Smokeless tobacco: Never Used  Substance and Sexual Activity  . Alcohol use: Not Currently    Comment: Last drink 6 months ago.  . Drug use: Yes    Types: Cocaine    Comment: " I dont use it like that"   . Sexual activity: Yes    Partners: Female  Lifestyle  . Physical activity:  Days per week: Not on file    Minutes per session: Not on file  . Stress: Not on file  Relationships  . Social connections:    Talks on phone: Not on file    Gets together: Not on file    Attends religious service: Not on file    Active member of club or organization: Not on file    Attends meetings of clubs or organizations: Not on file    Relationship status: Not on file  . Intimate partner violence:    Fear of current or ex partner: Not on file    Emotionally abused: Not on file    Physically abused: Not on file    Forced sexual activity: Not on file  Other Topics Concern  . Not on file  Social History Narrative  . Not on file    Family History:    Family History  Problem Relation Age of Onset  . Pancreatic cancer Mother   . CAD Father   . Diabetes Mellitus II Father      ROS:  Please see the history of present illness.  General:no colds or fevers, no weight changes Skin:no rashes or ulcers HEENT:no blurred vision, no congestion CV:see HPI PUL:see HPI GI:no diarrhea constipation or melena, no indigestion GU:no hematuria, no dysuria MS:no joint pain, no claudication Neuro:no syncope, no lightheadedness Endo:no diabetes, no thyroid disease  All other ROS reviewed and negative.     Physical Exam/Data:   Vitals:   08/26/18 0247 08/26/18 0500 08/26/18 0759 08/26/18 1139  BP: (!) 155/80  (!) 166/80 (!) 148/76  Pulse: 83  72 80  Resp: 20     Temp:   98.6 F (37 C)    TempSrc:   Oral   SpO2: 97%  98% 96%  Weight:  102 kg    Height:        Intake/Output Summary (Last 24 hours) at 08/26/2018 1727 Last data filed at 08/26/2018 0200 Gross per 24 hour  Intake 750 ml  Output 900 ml  Net -150 ml   Filed Weights   08/25/18 1838 08/26/18 0500  Weight: 102.1 kg 102 kg   Body mass index is 34.19 kg/m.  General:  Well nourished, well developed, in no acute distress HEENT: normal Lymph: no adenopathy Neck: no JVD Endocrine:  No thryomegaly Vascular: No carotid bruits; pedal pulses 2+ bilaterally  Cardiac:  normal S1, S2; RRR; no murmur gallup rub or click Lungs:  Rhonchi and wheezes to auscultation bilaterally,  no rales  Abd: soft, nontender, no hepatomegaly  Ext: no edema Musculoskeletal:  No deformities, BUE and BLE strength normal and equal Skin: warm and dry  Neuro:  CNs 2-12 intact, no focal abnormalities noted Psych:  Normal affect    Relevant CV Studies: Echo Pending  Laboratory Data:  Chemistry Recent Labs  Lab 08/25/18 1619 08/25/18 1627 08/26/18 0249  NA 139 137 137  K 4.0 4.0 3.6  CL 100 101 100  CO2 19*  --  23  GLUCOSE 251* 245* 136*  BUN 10 10 10   CREATININE 1.46* 1.40* 1.18*  CALCIUM 8.6*  --  8.8*  GFRNONAA 41*  --  53*  GFRAA 47*  --  >60  ANIONGAP 20*  --  14    Recent Labs  Lab 08/25/18 1619 08/26/18 0249  PROT 7.1 7.2  ALBUMIN 3.8 3.8  AST 215* 97*  ALT 151* 125*  ALKPHOS 113 97  BILITOT 0.3 0.5   Hematology Recent Labs  Lab 08/25/18 1619 08/25/18 1627  WBC 11.9*  --   RBC 3.83*  --   HGB 12.5 14.3  HCT 41.0 42.0  MCV 107.0*  --   MCH 32.6  --   MCHC 30.5  --   RDW 13.1  --   PLT 257  --    Cardiac Enzymes Recent Labs  Lab 08/26/18 0249 08/26/18 0845 08/26/18 1427  TROPONINI 0.30* 0.24* 0.26*    Recent Labs  Lab 08/25/18 1625  TROPIPOC 0.51*    BNP Recent Labs  Lab 08/26/18 0249  BNP 527.1*    DDimer No results for input(s): DDIMER in the last 168 hours.  Lipid Panel   No results found for: CHOL, TRIG, HDL, CHOLHDL, VLDL, LDLCALC, LDLDIRECT  Radiology/Studies:  Ct Head Wo Contrast  Result Date: 08/25/2018 CLINICAL DATA:  Altered mental status.  Post CPR. EXAM: CT HEAD WITHOUT CONTRAST TECHNIQUE: Contiguous axial images were obtained from the base of the skull through the vertex without intravenous contrast. COMPARISON:  None. FINDINGS: Brain: No evidence of acute infarction, hemorrhage, hydrocephalus, extra-axial collection or mass lesion/mass effect. Vascular: No hyperdense vessel or unexpected calcification. Skull: Normal. Negative for fracture or focal lesion. Sinuses/Orbits: Near complete opacification of the left mastoid air cells. No osseous destruction. The right mastoid air cells and paranasal sinuses are clear. The orbits are unremarkable. Other: None. IMPRESSION: 1.  No acute intracranial abnormality. 2. Near complete opacification of the left mastoid air cells. Electronically Signed   By: Obie Dredge M.D.   On: 08/25/2018 18:22   Ct Angio Chest Pe W/cm &/or Wo Cm  Result Date: 08/25/2018 CLINICAL DATA:  Altered level of consciousness. Respiratory distress. Patient underwent 5 minutes of CPR in route to the hospital. Central chest pain. EXAM: CT ANGIOGRAPHY CHEST WITH CONTRAST TECHNIQUE: Multidetector CT imaging of the chest was performed using the standard protocol during bolus administration of intravenous contrast. Multiplanar CT image reconstructions and MIPs were obtained to evaluate the vascular anatomy. CONTRAST:  ISOVUE-370 IOPAMIDOL (ISOVUE-370) INJECTION 76% COMPARISON:  Current chest radiographs. FINDINGS: Cardiovascular: Satisfactory opacification of the pulmonary arteries to the segmental level. No evidence of pulmonary embolism. Normal heart size. No pericardial effusion. No coronary artery calcifications. Great vessels are normal in caliber. No aortic dissection. Minimal aortic atherosclerosis along the arch. Mediastinum/Nodes: No  enlarged mediastinal, hilar, or axillary lymph nodes. Thyroid gland, trachea, and esophagus demonstrate no significant findings. Lungs/Pleura: There are patchy areas of predominantly peribronchovascular ground-glass airspace opacity in the upper lobes, right greater than left. There is additional peribronchovascular airspace opacity in the posterior lower lobes, right slightly greater than left. Remainder of the lungs is clear. Minimal right pleural effusion. No pneumothorax. Upper Abdomen: No acute abnormality. Musculoskeletal: No chest wall abnormality. No acute or significant osseous findings. Review of the MIP images confirms the above findings. IMPRESSION: 1. No evidence of a pulmonary embolism. 2. Bilateral areas of peribronchovascular ground-glass opacity in the upper lobes and lower lobes, right greater than left. A component of the lower lobe opacity is likely atelectasis. Findings are consistent with multifocal pneumonia. Asymmetric pulmonary edema is felt less likely but not excluded. Aortic Atherosclerosis (ICD10-I70.0). Electronically Signed   By: Amie Portland M.D.   On: 08/25/2018 18:21   Dg Chest Portable 1 View  Result Date: 08/25/2018 CLINICAL DATA:  Shortness of breath. EXAM: PORTABLE CHEST 1 VIEW COMPARISON:  None. FINDINGS: Provided date of birth 08/24/1876. 1559 hours. The heart size is at the upper limits of normal for portable  technique. The pulmonary vasculature is ill-defined and there are diffuse bilateral pulmonary opacities most consistent with acute congestive heart failure. No consolidation, pneumothorax or significant pleural effusion identified. The bones appear unremarkable. External pacer and telemetry leads are in place. IMPRESSION: Probable congestive heart failure. No comparison studies are currently available. Provided demographics are incorrect. If corrected and prior studies can be located, this report can be addended. Electronically Signed   By: Carey BullocksWilliam  Veazey M.D.   On:  08/25/2018 16:16    Assessment and Plan:   1. PEA arrest 5 min of CPR and epi, in setting of respiratory failure and cocaine use.  No acute EKG changes, pt is on welbutrin, haldol for her bipolar disease and + cocaine. troponins elevated, but this could be due to CPR.  + FH of premature CAD father at 9150 with MI, would do cardiac CTA vs. Cath.  Dr. Tresa EndoKelly to see.   Did have chest pressure with her SOB.  HTN on amlodipine 5 , catapres 0.1 mg at hx, hydrodiuril, lopressor  Echo pending.   2. Asthma per IM and PNA 3. Bipolar disorder treated 4. elevated LFTs per IM 5. HLD on statin 6. HTN on medication but elevated earlier currently 148/76       For questions or updates, please contact [facility] Please consult [email or web address] for contact info under     Signed, Nada BoozerLaura Ingold, NP  08/26/2018 5:27 PM    Patient seen and examined. Agree with assessment and plan.  Amanda [name] is a 53-year-old African-American female [place]  She has a history of asthma, bipolar disorder, hypertension, hyperlipidemia, as well as history of migraine headaches for which she takes as needed Imitrex and open max.  For the past week she had noticed increasing episodes of shortness of breath with difficult breathing.  She has a history of asthma and has noticed some recent wheezing.  She has a history of intermittent cocaine use with her last cocaine 2 days ago.  Yesterday he developed increasing shortness of breath, called EMS, and shortly after arrival she became unresponsive.  CPR was administered for 5 minutes for PEA arrest and she received 1 dose of epinephrine.  She responded quickly with spontaneous breathing.  Initial lactate was elevated but this normalized quickly.  Initial troponins were borderline positive suggesting a demand ischemic etiology.  CTA of her chest did not reveal any pulmonary embolism.  She is noted to have bilateral peribronchial groundglass opacity in  the upper and lower lobes right greater than left in addition to atelectasis.  She was felt possibly to have multifocal pneumonia.  She has a history of tobacco use but admits to only smoking for the past 5 years.  She has been on Wellbutrin and Haldol for her bipolar disorder.  She has been treated with amlodipine, metoprolol tartrate in addition to hydrochlorothiazide for hypertension.  Presently, she is chest pain-free and breathing better.  Exam is notable in that she is obese.  Pressure was elevated earlier today at 166/80 and later 148/76. I/O since admission -150 cc.  Has a thick neck.  Mallampati scale is 3.  There was no wheezing.  Breath sounds were decreased.  Rhythm was regular with a faint systolic murmur.  There was no S3 gallop.  She had central adiposity.  Pulses were 2+.  Neurologic exam was grossly nonfocal.  She had a normal affect.  ECG revealed initial sinus tachycardia without acute ST-T changes with normal sinus  rhythm on subsequent tracing.  Echocardiogram has been ordered but results are pending.  Plan to obtain echo to assess hypertensive cardiovascular disease, LV systolic and diastolic function and valvular architecture.  She has a few premature family history for CAD.  I had a long discussion regarding cocaine induced vasospasm as well as potential Imitrex contributing to nasal spasm.  I would recommend discontinuance of Imitrex.  We discussed complete abstinence of cocaine.  With her recent PEA arrest, recommend definitive coronary angiography for coronary artery assessment.  LFTs are elevated bili contributed by PEA arrest.  Would hold statin.  Monitor QTc interval on current therapy.  Will follow.   Lennette Biharihomas A. Lisette Mancebo, MD, [facility] 08/26/2018 7:31 PM

## 2018-08-26 NOTE — Progress Notes (Signed)
[facility] PLAN OF CARE NOTE Patient: Amanda [name] TGG:[id number]   PCP: Hoy Register, MD DOB: 10-14-1965   DOA: 08/25/2018   DOS: 08/26/2018    Patient was admitted by my colleague Dr. Toniann Fail  earlier on 08/26/2018. I have reviewed the H&P as well as assessment and plan and agree with the same. Important changes in the plan are listed below.  Plan of care: Principal Problem:   Acute respiratory failure with hypoxia (HCC) Active Problems:   Respiratory arrest (HCC)   Elevated troponin   Contusion of chest   Community acquired pneumonia   Cardiac arrest (HCC)   Cocaine abuse (HCC)   Hyperglycemia   Bipolar 1 disorder (HCC)   HLD (hyperlipidemia)   Elevated LFTs   Essential hypertension   Continue close monitoring. Cardiology was consulted last night, will follow recommendation. Echocardiogram currently pending. Suspect PEA arrest secondary to cocaine use as well as asthma with severe bronchospasm.  Author: Lynden Oxford, MD [facility] Pager: [phone] 08/26/2018 7:35 PM   If 7PM-7AM, please contact night-coverage at [email or web address], password [facility]

## 2018-08-26 NOTE — H&P (Addendum)
History and Physical    Amanda [name] ZOX:[id number] DOB: 06/20/1966 DOA: 08/25/2018  PCP: Hoy Register, MD  Patient coming from: Home.  Chief Complaint: Respiratory distress cardiac arrest.  HPI: Amanda [name] is a 53 y.o. female with history of hypertension, bipolar disorder, hyperlipidemia, asthma was brought to the ER after patient was in acute respiratory distress had gone into PEA arrest was resuscitated by CPR for 5 minutes with 1 dose of epinephrine given.  By the time patient reached the ER patient became more alert awake and was breathing by herself.  Patient states she has been feeling short of breath for the last 1 week even at rest.  At times has been having some wheezing denies any productive cough fever or chills.  Yesterday patient also started developing some chest pressure and at this point because patient's breathing became more acutely worse patient called EMS.  As per the report by the time EMS reached patient has become unresponsive and had to be given CPR and 1 dose of epinephrine through IO.  Per ER physician patient was given CPR for 5 minutes.  Patient on my questioning admits to having taken cocaine 2 days ago.  ED Course: By the time patient is the ER patient is able to breathe herself and had a rhythm.  Complain of some chest pain.  Was initially mildly confused.  Lactate was around 10 which improved with fluids.  EKG was showing sinus tachycardia point-of-care troponin is mildly elevated.  Critical care was consulted initially but since lactate improved and patient is back to baseline patient has been admitted to hospitalist service.  Labs also show elevated AST ALT.  CT angiogram of the chest done shows possibility of pneumonia versus pulmonary edema.  Note that patient has another chart which shows a normal creatinine in the last month.  Creatinine is increased today.  Patient was started on Levaquin for pneumonia.  Review of Systems: As per HPI, rest all  negative.   Past Medical History:  Diagnosis Date  . Asthma   . Bipolar disorder (HCC)   . Heart murmur   . HLD (hyperlipidemia)   . Hypertension     Past Surgical History:  Procedure Laterality Date  . CHOLECYSTECTOMY    . FOOT SURGERY       reports that she has been smoking cigarettes. She has never used smokeless tobacco. She reports previous alcohol use. She reports current drug use. Drug: Cocaine.  Allergies  Allergen Reactions  . Penicillins Anaphylaxis    DID THE REACTION INVOLVE: Swelling of the face/tongue/throat, SOB, or low BP? Yes Sudden or severe rash/hives, skin peeling, or the inside of the mouth or nose? Yes Did it require medical treatment? No When did it last happen? Within the past 10 years If all above answers are "NO", may proceed with cephalosporin use.    . Aspirin Rash    Family History  Problem Relation Age of Onset  . Pancreatic cancer Mother   . CAD Father   . Diabetes Mellitus II Father     Prior to Admission medications   Medication Sig Start Date End Date Taking? Authorizing Provider  albuterol (VENTOLIN HFA) 108 (90 Base) MCG/ACT inhaler Inhale 2 puffs into the lungs every 6 (six) hours as needed for wheezing or shortness of breath.   Yes [provider]  amLODipine (NORVASC) 5 MG tablet Take 5 mg by mouth daily.   Yes [provider]  atorvastatin (LIPITOR) 20 MG tablet Take  20 mg by mouth at bedtime.   Yes [provider]  buPROPion (WELLBUTRIN SR) 150 MG 12 hr tablet Take 150 mg by mouth daily.   Yes [provider]  cetirizine (ZYRTEC) 10 MG tablet Take 10 mg by mouth daily.   Yes [provider]  cloNIDine (CATAPRES) 0.1 MG tablet Take 0.1 mg by mouth at bedtime as needed (for hot flashes).    Yes [provider]  cyclobenzaprine (FLEXERIL) 10 MG tablet Take 10 mg by mouth 2 (two) times daily as needed for muscle spasms.   Yes [provider]  dexlansoprazole (DEXILANT)  60 MG capsule Take 60 mg by mouth daily.   Yes [provider]  fluticasone (FLONASE) 50 MCG/ACT nasal spray Place 2 sprays into both nostrils daily as needed for allergies or rhinitis.   Yes [provider]  haloperidol (HALDOL) 5 MG tablet Take 7.5 mg by mouth at bedtime.   Yes [provider]  hydrochlorothiazide (HYDRODIURIL) 25 MG tablet Take 25 mg by mouth daily.   Yes [provider]  hydrOXYzine (ATARAX/VISTARIL) 50 MG tablet Take 50 mg by mouth 3 (three) times daily as needed for anxiety.   Yes [provider]  meloxicam (MOBIC) 15 MG tablet Take 15 mg by mouth at bedtime.   Yes [provider]  metoprolol tartrate (LOPRESSOR) 100 MG tablet Take 100 mg by mouth 2 (two) times daily.   Yes [provider]  olopatadine (PATANOL) 0.1 % ophthalmic solution Place 1-2 drops into both eyes 2 (two) times daily.   Yes [provider]  pregabalin (LYRICA) 75 MG capsule Take 75 mg by mouth 2 (two) times daily as needed (for nerve pain).   Yes [provider]  SUMAtriptan (IMITREX) 50 MG tablet Take 50 mg by mouth See admin instructions. Take 50 mg by mouth at onset of headache and may repeat once in 2 hours, if no relief   Yes [provider]  topiramate (TOPAMAX) 100 MG tablet Take 200 mg by mouth 2 (two) times daily.   Yes [provider]  traZODone (DESYREL) 100 MG tablet Take 300 mg by mouth at bedtime.   Yes [provider]  Vitamin D, Ergocalciferol, (DRISDOL) 1.25 MG (50000 UT) CAPS capsule Take 50,000 Units by mouth every 7 (seven) days.   Yes [provider]    Physical Exam: Vitals:   08/25/18 1930 08/25/18 2000 08/25/18 2114 08/25/18 2334  BP: (!) 162/99 (!) 156/92 (!) 179/103   Pulse: 79 83 80   Resp: 20 12 (!) 22   Temp:   98.5 F (36.9 C) 98 F (36.7 C)  TempSrc:   Oral   SpO2: 100% 100% 100%   Weight:      Height:          Constitutional: Moderately built  and nourished. Vitals:   08/25/18 1930 08/25/18 2000 08/25/18 2114 08/25/18 2334  BP: (!) 162/99 (!) 156/92 (!) 179/103   Pulse: 79 83 80   Resp: 20 12 (!) 22   Temp:   98.5 F (36.9 C) 98 F (36.7 C)  TempSrc:   Oral   SpO2: 100% 100% 100%   Weight:      Height:       Eyes: Anicteric no pallor. ENMT: No discharge from the ears eyes nose or mouth. Neck: No JVD appreciated no mass felt. Respiratory: No rhonchi or crepitations. Cardiovascular: S1-S2 heard. Abdomen: Soft nontender bowel sounds present. Musculoskeletal: No edema.  No  joint effusion. Skin: No rash. Neurologic: Alert awake oriented to time place and person.  Moves all extremities. Psychiatric: Appears normal.   Labs on Admission: I have personally reviewed following labs and imaging studies  CBC: Recent Labs  Lab 08/25/18 1619 08/25/18 1627  WBC 11.9*  --   NEUTROABS 6.4  --   HGB 12.5 14.3  HCT 41.0 42.0  MCV 107.0*  --   PLT 257  --    Basic Metabolic Panel: Recent Labs  Lab 08/25/18 1619 08/25/18 1627  NA 139 137  K 4.0 4.0  CL 100 101  CO2 19*  --   GLUCOSE 251* 245*  BUN 10 10  CREATININE 1.46* 1.40*  CALCIUM 8.6*  --    GFR: Estimated Creatinine Clearance: 58.1 mL/min (A) (by C-G formula based on SCr of 1.4 mg/dL (H)). Liver Function Tests: Recent Labs  Lab 08/25/18 1619  AST 215*  ALT 151*  ALKPHOS 113  BILITOT 0.3  PROT 7.1  ALBUMIN 3.8   No results for input(s): LIPASE, AMYLASE in the last 168 hours. No results for input(s): AMMONIA in the last 168 hours. Coagulation Profile: No results for input(s): INR, PROTIME in the last 168 hours. Cardiac Enzymes: No results for input(s): CKTOTAL, CKMB, CKMBINDEX, TROPONINI in the last 168 hours. BNP (last 3 results) No results for input(s): PROBNP in the last 8760 hours. HbA1C: No results for input(s): HGBA1C in the last 72 hours. CBG: No results for input(s): GLUCAP in the last 168 hours. Lipid Profile: No results for input(s):  CHOL, HDL, LDLCALC, TRIG, CHOLHDL, LDLDIRECT in the last 72 hours. Thyroid Function Tests: No results for input(s): TSH, T4TOTAL, FREET4, T3FREE, THYROIDAB in the last 72 hours. Anemia Panel: No results for input(s): VITAMINB12, FOLATE, FERRITIN, TIBC, IRON, RETICCTPCT in the last 72 hours. Urine analysis:    Component Value Date/Time   COLORURINE COLORLESS (A) 08/25/2018 1840   APPEARANCEUR CLEAR 08/25/2018 1840   LABSPEC 1.017 08/25/2018 1840   PHURINE 7.0 08/25/2018 1840   GLUCOSEU NEGATIVE 08/25/2018 1840   HGBUR LARGE (A) 08/25/2018 1840   BILIRUBINUR NEGATIVE 08/25/2018 1840   KETONESUR NEGATIVE 08/25/2018 1840   PROTEINUR NEGATIVE 08/25/2018 1840   NITRITE NEGATIVE 08/25/2018 1840   LEUKOCYTESUR NEGATIVE 08/25/2018 1840   Sepsis Labs: @LABRCNTIP (procalcitonin:4,lacticidven:4) )No results found for this or any previous visit (from the past 240 hour(s)).   Radiological Exams on Admission: Ct Head Wo Contrast  Result Date: 08/25/2018 CLINICAL DATA:  Altered mental status.  Post CPR. EXAM: CT HEAD WITHOUT CONTRAST TECHNIQUE: Contiguous axial images were obtained from the base of the skull through the vertex without intravenous contrast. COMPARISON:  None. FINDINGS: Brain: No evidence of acute infarction, hemorrhage, hydrocephalus, extra-axial collection or mass lesion/mass effect. Vascular: No hyperdense vessel or unexpected calcification. Skull: Normal. Negative for fracture or focal lesion. Sinuses/Orbits: Near complete opacification of the left mastoid air cells. No osseous destruction. The right mastoid air cells and paranasal sinuses are clear. The orbits are unremarkable. Other: None. IMPRESSION: 1.  No acute intracranial abnormality. 2. Near complete opacification of the left mastoid air cells. Electronically Signed   By: Obie DredgeWilliam T Derry M.D.   On: 08/25/2018 18:22   Ct Angio Chest Pe W/cm &/or Wo Cm  Result Date: 08/25/2018 CLINICAL DATA:  Altered level of consciousness.  Respiratory distress. Patient underwent 5 minutes of CPR in route to the hospital. Central chest pain. EXAM: CT ANGIOGRAPHY CHEST WITH CONTRAST TECHNIQUE: Multidetector CT imaging of the chest was performed using the  standard protocol during bolus administration of intravenous contrast. Multiplanar CT image reconstructions and MIPs were obtained to evaluate the vascular anatomy. CONTRAST:  ISOVUE-370 IOPAMIDOL (ISOVUE-370) INJECTION 76% COMPARISON:  Current chest radiographs. FINDINGS: Cardiovascular: Satisfactory opacification of the pulmonary arteries to the segmental level. No evidence of pulmonary embolism. Normal heart size. No pericardial effusion. No coronary artery calcifications. Great vessels are normal in caliber. No aortic dissection. Minimal aortic atherosclerosis along the arch. Mediastinum/Nodes: No enlarged mediastinal, hilar, or axillary lymph nodes. Thyroid gland, trachea, and esophagus demonstrate no significant findings. Lungs/Pleura: There are patchy areas of predominantly peribronchovascular ground-glass airspace opacity in the upper lobes, right greater than left. There is additional peribronchovascular airspace opacity in the posterior lower lobes, right slightly greater than left. Remainder of the lungs is clear. Minimal right pleural effusion. No pneumothorax. Upper Abdomen: No acute abnormality. Musculoskeletal: No chest wall abnormality. No acute or significant osseous findings. Review of the MIP images confirms the above findings. IMPRESSION: 1. No evidence of a pulmonary embolism. 2. Bilateral areas of peribronchovascular ground-glass opacity in the upper lobes and lower lobes, right greater than left. A component of the lower lobe opacity is likely atelectasis. Findings are consistent with multifocal pneumonia. Asymmetric pulmonary edema is felt less likely but not excluded. Aortic Atherosclerosis (ICD10-I70.0). Electronically Signed   By: Amie Portland M.D.   On: 08/25/2018  18:21   Dg Chest Portable 1 View  Result Date: 08/25/2018 CLINICAL DATA:  Shortness of breath. EXAM: PORTABLE CHEST 1 VIEW COMPARISON:  None. FINDINGS: Provided date of birth 08/24/1876. 1559 hours. The heart size is at the upper limits of normal for portable technique. The pulmonary vasculature is ill-defined and there are diffuse bilateral pulmonary opacities most consistent with acute congestive heart failure. No consolidation, pneumothorax or significant pleural effusion identified. The bones appear unremarkable. External pacer and telemetry leads are in place. IMPRESSION: Probable congestive heart failure. No comparison studies are currently available. Provided demographics are incorrect. If corrected and prior studies can be located, this report can be addended. Electronically Signed   By: Carey Bullocks M.D.   On: 08/25/2018 16:16    EKG: Independently reviewed.  Sinus tachycardia.  Nonspecific ST-T changes.  Assessment/Plan Principal Problem:   Acute respiratory failure with hypoxia (HCC) Active Problems:   Respiratory arrest (HCC)   Elevated troponin   Contusion of chest   Community acquired pneumonia   Cardiac arrest (HCC)   Cocaine abuse (HCC)   Hyperglycemia   Bipolar 1 disorder (HCC)   HLD (hyperlipidemia)   Elevated LFTs   Essential hypertension    1. Acute respiratory failure with hypoxia likely from possible pneumonia versus CHF.  Patient has been placed on Levaquin check blood cultures sputum cultures procalcitonin urine for strep antigen Legionella and influenza PCR.  Will check BNP and cycle cardiac markers check 2D echo.  Not order any Lasix at this time. 2. PEA arrest in the setting of respiratory failure and cocaine abuse -we will cycle cardiac markers check 2D echo cardiology consulted.  Advised patient not to use cocaine. 3. Cocaine abuse -counseled. 4. Hypertension -on amlodipine and hydrochlorothiazide.  Will hold metoprolol since patient is positive for  cocaine.  PRN IV hydralazine. 5. Hyperglycemia -check hemoglobin A1c. 6. Hyperlipidemia on statins.  Will hold statins due to elevated LFTs. 7. Elevated LFTs -cause not sure.  Follow LFTs abdomen appears benign hold statins continue hydration check Tylenol level acute hepatitis panel.  Could be from hypotensive episode from PEA arrest. 8. Bipolar disorder  on Haldol Wellbutrin. 9. Chronic pain on Lyrica and NSAIDs. 10. Acute renal failure cause not clear.  Could be from hypotensive episode.  UA unremarkable.  Follow metabolic panel. 11. History of peptic ulcer disease and GERD. 12. History of asthma presently not wheezing was wheezing initially.  Will keep patient on PRN albuterol.   DVT prophylaxis: SCDs for now since patient had CPR. Code Status: Full code. Family Communication: Discussed with patient. Disposition Plan: To be determined. Consults called: Cardiology. Admission status: Inpatient.   Eduard Clos MD [facility] Pager [phone].  If 7PM-7AM, please contact night-coverage [email or web address] Password [email or web address]  08/26/2018, 2:19 AM

## 2018-08-26 NOTE — Progress Notes (Signed)
Pt admitted to 2W a/o x4. Only skin issue noted was the bloody spot on her leg from the IO. Pt stating she is in pain from the compressions. Girlfriend at the bedside. Medication given to her to take home. Pt VSS. Paged MD about pain medication and pts night medication.   0230- pts regular medication ordered at this time- some medications are for sleep. Clarified with md for pt to get the medication this late.. Stated to give.

## 2018-08-26 NOTE — Progress Notes (Signed)
   08/26/18 1114  Clinical Encounter Type  Visited With Patient  Visit Type Initial;Other (Comment) (AD)  Referral From Physician  Consult/Referral To Chaplain  The chaplain responded to Pt. spiritual care consult for AD.  The Pt. was sleeping at time of chaplain's visit.  The chaplain will F/U as necessary.

## 2018-08-27 ENCOUNTER — Inpatient Hospital Stay (HOSPITAL_COMMUNITY): Payer: BLUE CROSS/BLUE SHIELD

## 2018-08-27 DIAGNOSIS — J9601 Acute respiratory failure with hypoxia: Secondary | ICD-10-CM | POA: Diagnosis not present

## 2018-08-27 DIAGNOSIS — I370 Nonrheumatic pulmonary valve stenosis: Secondary | ICD-10-CM | POA: Diagnosis not present

## 2018-08-27 DIAGNOSIS — I469 Cardiac arrest, cause unspecified: Secondary | ICD-10-CM

## 2018-08-27 DIAGNOSIS — I214 Non-ST elevation (NSTEMI) myocardial infarction: Secondary | ICD-10-CM | POA: Diagnosis not present

## 2018-08-27 DIAGNOSIS — J189 Pneumonia, unspecified organism: Secondary | ICD-10-CM | POA: Diagnosis not present

## 2018-08-27 DIAGNOSIS — I361 Nonrheumatic tricuspid (valve) insufficiency: Secondary | ICD-10-CM

## 2018-08-27 LAB — COMPREHENSIVE METABOLIC PANEL
ALT: 67 U/L — ABNORMAL HIGH (ref 0–44)
AST: 28 U/L (ref 15–41)
Albumin: 3.5 g/dL (ref 3.5–5.0)
Alkaline Phosphatase: 77 U/L (ref 38–126)
Anion gap: 7 (ref 5–15)
BUN: 17 mg/dL (ref 6–20)
CO2: 24 mmol/L (ref 22–32)
Calcium: 8.9 mg/dL (ref 8.9–10.3)
Chloride: 106 mmol/L (ref 98–111)
Creatinine, Ser: 1.39 mg/dL — ABNORMAL HIGH (ref 0.44–1.00)
GFR calc Af Amer: 50 mL/min — ABNORMAL LOW (ref >60)
GFR calc non Af Amer: 43 mL/min — ABNORMAL LOW (ref >60)
Glucose, Bld: 104 mg/dL — ABNORMAL HIGH (ref 70–99)
Potassium: 3.8 mmol/L (ref 3.5–5.1)
Sodium: 137 mmol/L (ref 135–145)
Total Bilirubin: 0.4 mg/dL (ref 0.3–1.2)
Total Protein: 6.8 g/dL (ref 6.5–8.1)

## 2018-08-27 LAB — LEGIONELLA PNEUMOPHILA SEROGP 1 UR AG: L. pneumophila Serogp 1 Ur Ag: NEGATIVE

## 2018-08-27 LAB — GLUCOSE, CAPILLARY
Glucose-Capillary: 102 mg/dL — ABNORMAL HIGH (ref 70–99)
Glucose-Capillary: 111 mg/dL — ABNORMAL HIGH (ref 70–99)
Glucose-Capillary: 119 mg/dL — ABNORMAL HIGH (ref 70–99)
Glucose-Capillary: 105 mg/dL — ABNORMAL HIGH (ref 70–99)

## 2018-08-27 LAB — CBC WITH DIFFERENTIAL/PLATELET
Abs Immature Granulocytes: 0.05 10*3/uL (ref 0.00–0.07)
Basophils Absolute: 0 10*3/uL (ref 0.0–0.1)
Basophils Relative: 0 %
Eosinophils Absolute: 0 10*3/uL (ref 0.0–0.5)
Eosinophils Relative: 0 %
HCT: 35.3 % — ABNORMAL LOW (ref 36.0–46.0)
Hemoglobin: 11.3 g/dL — ABNORMAL LOW (ref 12.0–15.0)
Immature Granulocytes: 0 %
Lymphocytes Relative: 23 %
Lymphs Abs: 2.6 10*3/uL (ref 0.7–4.0)
MCH: 32.6 pg (ref 26.0–34.0)
MCHC: 32 g/dL (ref 30.0–36.0)
MCV: 101.7 fL — ABNORMAL HIGH (ref 80.0–100.0)
Monocytes Absolute: 0.9 10*3/uL (ref 0.1–1.0)
Monocytes Relative: 8 %
Neutro Abs: 7.6 10*3/uL (ref 1.7–7.7)
Neutrophils Relative %: 69 %
Platelets: 226 10*3/uL (ref 150–400)
RBC: 3.47 MIL/uL — ABNORMAL LOW (ref 3.87–5.11)
RDW: 13.2 % (ref 11.5–15.5)
WBC: 11.3 10*3/uL — ABNORMAL HIGH (ref 4.0–10.5)
nRBC: 0 % (ref 0.0–0.2)

## 2018-08-27 LAB — HEPATITIS PANEL, ACUTE
HCV Ab: <0.1 s/co ratio (ref 0.0–0.9)
Hep A IgM: NEGATIVE
Hep B C IgM: NEGATIVE
Hepatitis B Surface Ag: NEGATIVE

## 2018-08-27 LAB — ECHOCARDIOGRAM COMPLETE
Height: 68 in
Weight: 3568 oz

## 2018-08-27 MED ORDER — ASPIRIN 81 MG PO CHEW
81.0000 mg | CHEWABLE_TABLET | Freq: Every day | ORAL | Status: DC
  Administered 2018-08-27 – 2018-08-30 (×4): 81 mg via ORAL
  Filled 2018-08-27 (×4): qty 1

## 2018-08-27 MED ORDER — CYCLOBENZAPRINE HCL 10 MG PO TABS
10.0000 mg | ORAL_TABLET | Freq: Three times a day (TID) | ORAL | Status: DC
  Administered 2018-08-27 – 2018-08-29 (×8): 10 mg via ORAL
  Filled 2018-08-27 (×9): qty 1

## 2018-08-27 MED ORDER — LIDOCAINE 5 % EX PTCH
1.0000 | MEDICATED_PATCH | CUTANEOUS | Status: DC
  Administered 2018-08-27 – 2018-08-30 (×4): 1 via TRANSDERMAL
  Filled 2018-08-27 (×5): qty 1

## 2018-08-27 NOTE — Progress Notes (Signed)
*  PRELIMINARY RESULTS* Echocardiogram 2D Echocardiogram has been performed.  Amanda [name] 08/27/2018, 10:37 AM

## 2018-08-27 NOTE — Social Work (Signed)
CSW acknowledging consult for access to medications at discharge.  For medication access please consult RN Case Management.   CSW signing off. Please consult if any additional needs arise.  Cletus Mehlhoff, MSW, LCSWA [facility] [phone]     

## 2018-08-27 NOTE — Progress Notes (Signed)
Progress Note  Patient Name: Amanda [name] Date of Encounter: 08/27/2018  Primary Cardiologist: Dr Tresa Endo  Subjective   Has CP from CPR; mild dyspnea  Inpatient Medications    Scheduled Meds: . amLODipine  5 mg Oral Daily  . buPROPion  150 mg Oral Daily  . cloNIDine  0.1 mg Oral QHS  . fluticasone  2 spray Each Nare Daily  . haloperidol  7.5 mg Oral QHS  . insulin aspart  0-9 Units Subcutaneous TID WC  . levofloxacin  750 mg Oral q1800  . pantoprazole  40 mg Oral Daily  . topiramate  200 mg Oral BID  . traZODone  300 mg Oral QHS   Continuous Infusions:  PRN Meds: acetaminophen **OR** acetaminophen, albuterol, cyclobenzaprine, fluticasone, hydrALAZINE, hydrOXYzine, ondansetron **OR** ondansetron (ZOFRAN) IV, pregabalin   Vital Signs    Vitals:   08/26/18 2342 08/27/18 0030 08/27/18 0500 08/27/18 0739  BP: (!) 173/91 (!) 151/90  (!) 155/86  Pulse: 71   63  Resp: 18   12  Temp: 97.7 F (36.5 C)   97.7 F (36.5 C)  TempSrc: Oral   Oral  SpO2: 100%   100%  Weight:   101.2 kg   Height:        Intake/Output Summary (Last 24 hours) at 08/27/2018 1049 Last data filed at 08/27/2018 0500 Gross per 24 hour  Intake -  Output 1000 ml  Net -1000 ml   Filed Weights   08/25/18 1838 08/26/18 0500 08/27/18 0500  Weight: 102.1 kg 102 kg 101.2 kg    Telemetry    Sinus- Personally Reviewed  ECG    Sinus rhythm, probable early repolarization pattern, prolonged QT interval- Personally Reviewed  Physical Exam   GEN: No acute distress.   Neck: No JVD Cardiac: RRR, no murmurs, rubs, or gallops.  Respiratory: Clear to auscultation bilaterally. CP reproduced with palpation GI: Soft, nontender, non-distended  MS: No edema Neuro:  Nonfocal  Psych: Normal affect   Labs    Chemistry Recent Labs  Lab 08/25/18 1619 08/25/18 1627 08/26/18 0249 08/27/18 0731  NA 139 137 137 137  K 4.0 4.0 3.6 3.8  CL 100 101 100 106  CO2 19*  --  23 24  GLUCOSE 251* 245* 136* 104*    BUN 10 10 10 17   CREATININE 1.46* 1.40* 1.18* 1.39*  CALCIUM 8.6*  --  8.8* 8.9  PROT 7.1  --  7.2 6.8  ALBUMIN 3.8  --  3.8 3.5  AST 215*  --  97* 28  ALT 151*  --  125* 67*  ALKPHOS 113  --  97 77  BILITOT 0.3  --  0.5 0.4  GFRNONAA 41*  --  53* 43*  GFRAA 47*  --  >60 50*  ANIONGAP 20*  --  14 7     Hematology Recent Labs  Lab 08/25/18 1619 08/25/18 1627 08/27/18 0731  WBC 11.9*  --  11.3*  RBC 3.83*  --  3.47*  HGB 12.5 14.3 11.3*  HCT 41.0 42.0 35.3*  MCV 107.0*  --  101.7*  MCH 32.6  --  32.6  MCHC 30.5  --  32.0  RDW 13.1  --  13.2  PLT 257  --  226    Cardiac Enzymes Recent Labs  Lab 08/26/18 0249 08/26/18 0845 08/26/18 1427  TROPONINI 0.30* 0.24* 0.26*    Recent Labs  Lab 08/25/18 1625  TROPIPOC 0.51*     BNP Recent Labs  Lab 08/26/18  0249  BNP 527.1*     Radiology    Ct Head Wo Contrast  Result Date: 08/25/2018 CLINICAL DATA:  Altered mental status.  Post CPR. EXAM: CT HEAD WITHOUT CONTRAST TECHNIQUE: Contiguous axial images were obtained from the base of the skull through the vertex without intravenous contrast. COMPARISON:  None. FINDINGS: Brain: No evidence of acute infarction, hemorrhage, hydrocephalus, extra-axial collection or mass lesion/mass effect. Vascular: No hyperdense vessel or unexpected calcification. Skull: Normal. Negative for fracture or focal lesion. Sinuses/Orbits: Near complete opacification of the left mastoid air cells. No osseous destruction. The right mastoid air cells and paranasal sinuses are clear. The orbits are unremarkable. Other: None. IMPRESSION: 1.  No acute intracranial abnormality. 2. Near complete opacification of the left mastoid air cells. Electronically Signed   By: Obie DredgeWilliam T Derry M.D.   On: 08/25/2018 18:22   Ct Angio Chest Pe W/cm &/or Wo Cm  Result Date: 08/25/2018 CLINICAL DATA:  Altered level of consciousness. Respiratory distress. Patient underwent 5 minutes of CPR in route to the hospital. Central  chest pain. EXAM: CT ANGIOGRAPHY CHEST WITH CONTRAST TECHNIQUE: Multidetector CT imaging of the chest was performed using the standard protocol during bolus administration of intravenous contrast. Multiplanar CT image reconstructions and MIPs were obtained to evaluate the vascular anatomy. CONTRAST:  100mL ISOVUE-370 IOPAMIDOL (ISOVUE-370) INJECTION 76% COMPARISON:  Current chest radiographs. FINDINGS: Cardiovascular: Satisfactory opacification of the pulmonary arteries to the segmental level. No evidence of pulmonary embolism. Normal heart size. No pericardial effusion. No coronary artery calcifications. Great vessels are normal in caliber. No aortic dissection. Minimal aortic atherosclerosis along the arch. Mediastinum/Nodes: No enlarged mediastinal, hilar, or axillary lymph nodes. Thyroid gland, trachea, and esophagus demonstrate no significant findings. Lungs/Pleura: There are patchy areas of predominantly peribronchovascular ground-glass airspace opacity in the upper lobes, right greater than left. There is additional peribronchovascular airspace opacity in the posterior lower lobes, right slightly greater than left. Remainder of the lungs is clear. Minimal right pleural effusion. No pneumothorax. Upper Abdomen: No acute abnormality. Musculoskeletal: No chest wall abnormality. No acute or significant osseous findings. Review of the MIP images confirms the above findings. IMPRESSION: 1. No evidence of a pulmonary embolism. 2. Bilateral areas of peribronchovascular ground-glass opacity in the upper lobes and lower lobes, right greater than left. A component of the lower lobe opacity is likely atelectasis. Findings are consistent with multifocal pneumonia. Asymmetric pulmonary edema is felt less likely but not excluded. Aortic Atherosclerosis (ICD10-I70.0). Electronically Signed   By: Amie Portlandavid  Ormond M.D.   On: 08/25/2018 18:21   Dg Chest Portable 1 View  Result Date: 08/25/2018 CLINICAL DATA:  Shortness of  breath. EXAM: PORTABLE CHEST 1 VIEW COMPARISON:  None. FINDINGS: Provided date of birth 08/24/1876. 1559 hours. The heart size is at the upper limits of normal for portable technique. The pulmonary vasculature is ill-defined and there are diffuse bilateral pulmonary opacities most consistent with acute congestive heart failure. No consolidation, pneumothorax or significant pleural effusion identified. The bones appear unremarkable. External pacer and telemetry leads are in place. IMPRESSION: Probable congestive heart failure. No comparison studies are currently available. Provided demographics are incorrect. If corrected and prior studies can be located, this report can be addended. Electronically Signed   By: Carey BullocksWilliam  Veazey M.D.   On: 08/25/2018 16:16    Patient Profile     53 y.o. female admitted following PEA arrest.  Occurred in the setting of acute respiratory distress and recent cocaine use.  Troponin minimally elevated.  Assessment & Plan  1 status post PEA arrest-occurred in the setting of respiratory distress and cocaine abuse.  Troponin minimally elevated.  Await echocardiogram performed earlier today.  Dr. Tresa Endo has reviewed and has recommended cardiac catheterization.  I discussed the risks and benefits including myocardial infarction, CVA and death and we will plan to proceed on Monday.  Treat with aspirin.  Note CT did not show a pulmonary embolus.  2 possible pneumonia-antibiotics per primary care.  3 elevated troponin-minimally elevated but incurred in the setting of CPR.  Does not appear to be consistent with acute coronary syndrome.  4 cocaine abuse-patient counseled on avoiding.  5 hypertension-continue present blood pressure medications and follow.  6 renal insufficiency-duration unknown.  Will hydrate prior to catheterization.  Follow renal function while in-house.  For questions or updates, please contact [facility] Please consult [email or web address] for contact info  under        Signed, Olga Millers, MD  08/27/2018, 10:49 AM

## 2018-08-27 NOTE — Progress Notes (Signed)
[facility] Progress Note  Patient: Amanda [name] UJW:[id number]   PCP: Hoy RegisterNewlin, Enobong, MD DOB: Nov 18, 1965   DOA: 08/25/2018   DOS: 08/27/2018   Date of Service: the patient was seen and examined on 08/27/2018  Brief hospital course: Pt. with PMH of Asthma, bipolar disorder, HLD and HTN; admitted on 08/25/2018, presented with complaint of unresponsiveness, was found to have possibly cocaine induced PEA arrest. Currently further plan is continue monitoring.  Subjective: Continues to complain of chest pain.  Continues to complain of left leg pain where the IO was inserted.  No nausea no vomiting.  Breathing is okay.  Assessment and Plan: 1. Acute respiratory failure with hypoxia likely from possible pneumonia versus CHF.  Patient has been placed on Levaquin, negative blood cultures sputum cultures procalcitonin urine for strep antigen Legionella and influenza PCR.  Currently on room air 2. PEA arrest in the setting of respiratory failure and cocaine abuse -troponin negative, echocardiogram unremarkable.  Cardiology was consulted who recommends cardiac catheterization possibly Monday.  Strongly Advised patient not to use cocaine. 3. Cocaine abuse -counseled. 4. Hypertension -on amlodipine and hydrochlorothiazide.  Will hold metoprolol since patient is positive for cocaine.  PRN IV hydralazine. 5. Hyperglycemia -4.3 hemoglobin A1c.  Currently resolved. 6. Hyperlipidemia on statins.  Will hold statins due to elevated LFTs. 7. Elevated LFTs -likely from PE and shock, currently getting better.   8. Bipolar disorder on Haldol Wellbutrin. 9. Chronic pain on Lyrica and NSAIDs.  Patient also reports chest pain, will use lidocaine patch. 10. Acute renal failure cause not clear.  Could be from hypotensive episode.  UA unremarkable.    Improving. 11. History of peptic ulcer disease and GERD. 12. History of asthma presently not wheezing was wheezing initially.  Will keep patient on PRN  albuterol. 13. Morbid obesity  Body mass index is 33.91 kg/m.  Dietary consultation   Diet: cardiac diet DVT Prophylaxis: subcutaneous Heparin  Advance goals of care discussion: full code  Family Communication: no family was present at bedside, at the time of interview.   Disposition:  Discharge to home Monday after Cardiac Catheterization .  Consultants: cardiology  Procedures: Echocardiogram   Scheduled Meds: . amLODipine  5 mg Oral Daily  . aspirin  81 mg Oral Daily  . buPROPion  150 mg Oral Daily  . cloNIDine  0.1 mg Oral QHS  . cyclobenzaprine  10 mg Oral TID  . fluticasone  2 spray Each Nare Daily  . haloperidol  7.5 mg Oral QHS  . insulin aspart  0-9 Units Subcutaneous TID WC  . levofloxacin  750 mg Oral q1800  . lidocaine  1 patch Transdermal Q24H  . pantoprazole  40 mg Oral Daily  . topiramate  200 mg Oral BID  . traZODone  300 mg Oral QHS   Continuous Infusions: PRN Meds: acetaminophen **OR** acetaminophen, albuterol, fluticasone, hydrALAZINE, hydrOXYzine, ondansetron **OR** ondansetron (ZOFRAN) IV, pregabalin Antibiotics: Anti-infectives (From admission, onward)   Start     Dose/Rate Route Frequency Ordered Stop   08/26/18 1800  levofloxacin (LEVAQUIN) tablet 750 mg     750 mg Oral Daily-1800 08/26/18 0859     08/25/18 1900  levofloxacin (LEVAQUIN) IVPB 750 mg  Status:  Discontinued     750 mg 100 mL/hr over 90 Minutes Intravenous Every 24 hours 08/25/18 1848 08/26/18 0859   08/25/18 1845  levofloxacin (LEVAQUIN) IVPB 750 mg     750 mg 100 mL/hr over 90 Minutes Intravenous  Once 08/25/18 1832 08/25/18 2028  Objective: Physical Exam: Vitals:   08/27/18 0030 08/27/18 0500 08/27/18 0739 08/27/18 1608  BP: (!) 151/90  (!) 155/86 (!) 123/58  Pulse:   63 68  Resp:   12 12  Temp:   97.7 F (36.5 C) 97.6 F (36.4 C)  TempSrc:   Oral Oral  SpO2:   100% 99%  Weight:  101.2 kg    Height:        Intake/Output Summary (Last 24 hours) at 08/27/2018  1706 Last data filed at 08/27/2018 0500 Gross per 24 hour  Intake -  Output 1000 ml  Net -1000 ml   Filed Weights   08/25/18 1838 08/26/18 0500 08/27/18 0500  Weight: 102.1 kg 102 kg 101.2 kg   General: Alert, Awake and Oriented to Time, Place and Person. Appear in mild distress, affect appropriate Eyes: PERRL, Conjunctiva normal ENT: Oral Mucosa clear moist. Neck: no JVD, no Abnormal Mass Or lumps Cardiovascular: S1 and S2 Present, no Murmur, Peripheral Pulses Present Respiratory: normal respiratory effort, Bilateral Air entry equal and Decreased, no use of accessory muscle, Clear to Auscultation, no Crackles, no wheezes Abdomen: Bowel Sound present, Soft and no tenderness, no hernia Skin: no redness, no Rash, no induration Extremities: no Pedal edema, no calf tenderness Neurologic: Grossly no focal neuro deficit. Bilaterally Equal motor strength  Data Reviewed: CBC: Recent Labs  Lab 08/25/18 1619 08/25/18 1627 08/27/18 0731  WBC 11.9*  --  11.3*  NEUTROABS 6.4  --  7.6  HGB 12.5 14.3 11.3*  HCT 41.0 42.0 35.3*  MCV 107.0*  --  101.7*  PLT 257  --  226   Basic Metabolic Panel: Recent Labs  Lab 08/25/18 1619 08/25/18 1627 08/26/18 0249 08/27/18 0731  NA 139 137 137 137  K 4.0 4.0 3.6 3.8  CL 100 101 100 106  CO2 19*  --  23 24  GLUCOSE 251* 245* 136* 104*  BUN 10 10 10 17   CREATININE 1.46* 1.40* 1.18* 1.39*  CALCIUM 8.6*  --  8.8* 8.9  MG  --   --  1.6*  --     Liver Function Tests: Recent Labs  Lab 08/25/18 1619 08/26/18 0249 08/27/18 0731  AST 215* 97* 28  ALT 151* 125* 67*  ALKPHOS 113 97 77  BILITOT 0.3 0.5 0.4  PROT 7.1 7.2 6.8  ALBUMIN 3.8 3.8 3.5   No results for input(s): LIPASE, AMYLASE in the last 168 hours. No results for input(s): AMMONIA in the last 168 hours. Coagulation Profile: No results for input(s): INR, PROTIME in the last 168 hours. Cardiac Enzymes: Recent Labs  Lab 08/26/18 0249 08/26/18 0845 08/26/18 1427  TROPONINI  0.30* 0.24* 0.26*   BNP (last 3 results) No results for input(s): PROBNP in the last 8760 hours. CBG: Recent Labs  Lab 08/26/18 1809 08/26/18 2156 08/27/18 0736 08/27/18 1205 08/27/18 1652  GLUCAP 141* 132* 102* 111* 119*   Studies: No results found.   Time spent: 35 minutes  Author: Lynden Oxford, MD [facility] Pager: [phone] 08/27/2018 5:06 PM  Between 7PM-7AM, please contact night-coverage at [email or web address], password [facility]

## 2018-08-28 DIAGNOSIS — I469 Cardiac arrest, cause unspecified: Secondary | ICD-10-CM | POA: Diagnosis not present

## 2018-08-28 LAB — CBC WITH DIFFERENTIAL/PLATELET
Abs Immature Granulocytes: 0.04 10*3/uL (ref 0.00–0.07)
Basophils Absolute: 0.1 10*3/uL (ref 0.0–0.1)
Basophils Relative: 1 %
Eosinophils Absolute: 0.1 10*3/uL (ref 0.0–0.5)
Eosinophils Relative: 2 %
HCT: 36.1 % (ref 36.0–46.0)
Hemoglobin: 11.3 g/dL — ABNORMAL LOW (ref 12.0–15.0)
Immature Granulocytes: 1 %
Lymphocytes Relative: 35 %
Lymphs Abs: 2.8 10*3/uL (ref 0.7–4.0)
MCH: 32.5 pg (ref 26.0–34.0)
MCHC: 31.3 g/dL (ref 30.0–36.0)
MCV: 103.7 fL — ABNORMAL HIGH (ref 80.0–100.0)
Monocytes Absolute: 0.7 10*3/uL (ref 0.1–1.0)
Monocytes Relative: 9 %
Neutro Abs: 4.2 10*3/uL (ref 1.7–7.7)
Neutrophils Relative %: 52 %
Platelets: 223 10*3/uL (ref 150–400)
RBC: 3.48 MIL/uL — ABNORMAL LOW (ref 3.87–5.11)
RDW: 13.4 % (ref 11.5–15.5)
WBC: 7.9 10*3/uL (ref 4.0–10.5)
nRBC: 0 % (ref 0.0–0.2)

## 2018-08-28 LAB — BASIC METABOLIC PANEL
Anion gap: 7 (ref 5–15)
BUN: 19 mg/dL (ref 6–20)
CO2: 22 mmol/L (ref 22–32)
Calcium: 8.8 mg/dL — ABNORMAL LOW (ref 8.9–10.3)
Chloride: 107 mmol/L (ref 98–111)
Creatinine, Ser: 1.28 mg/dL — ABNORMAL HIGH (ref 0.44–1.00)
GFR calc Af Amer: 55 mL/min — ABNORMAL LOW (ref >60)
GFR calc non Af Amer: 48 mL/min — ABNORMAL LOW (ref >60)
Glucose, Bld: 117 mg/dL — ABNORMAL HIGH (ref 70–99)
Potassium: 3.9 mmol/L (ref 3.5–5.1)
Sodium: 136 mmol/L (ref 135–145)

## 2018-08-28 LAB — HEPATIC FUNCTION PANEL
ALT: 50 U/L — ABNORMAL HIGH (ref 0–44)
AST: 21 U/L (ref 15–41)
Albumin: 3.3 g/dL — ABNORMAL LOW (ref 3.5–5.0)
Alkaline Phosphatase: 73 U/L (ref 38–126)
Bilirubin, Direct: <0.1 mg/dL (ref 0.0–0.2)
Total Bilirubin: 0.3 mg/dL (ref 0.3–1.2)
Total Protein: 6.2 g/dL — ABNORMAL LOW (ref 6.5–8.1)

## 2018-08-28 LAB — MAGNESIUM: Magnesium: 2 mg/dL (ref 1.7–2.4)

## 2018-08-28 LAB — GLUCOSE, CAPILLARY
Glucose-Capillary: 115 mg/dL — ABNORMAL HIGH (ref 70–99)
Glucose-Capillary: 81 mg/dL (ref 70–99)
Glucose-Capillary: 103 mg/dL — ABNORMAL HIGH (ref 70–99)
Glucose-Capillary: 112 mg/dL — ABNORMAL HIGH (ref 70–99)

## 2018-08-28 MED ORDER — ASPIRIN 81 MG PO CHEW
81.0000 mg | CHEWABLE_TABLET | ORAL | Status: AC
  Filled 2018-08-28: qty 1

## 2018-08-28 MED ORDER — HEPARIN SODIUM (PORCINE) 5000 UNIT/ML IJ SOLN
5000.0000 [IU] | Freq: Three times a day (TID) | INTRAMUSCULAR | Status: DC
  Administered 2018-08-28 – 2018-08-30 (×4): 5000 [IU] via SUBCUTANEOUS
  Filled 2018-08-28 (×4): qty 1

## 2018-08-28 MED ORDER — SODIUM CHLORIDE 0.9% FLUSH: 3.0000 mL | INTRAVENOUS | Status: DC | PRN

## 2018-08-28 MED ORDER — SODIUM CHLORIDE 0.9% FLUSH
3.0000 mL | Freq: Two times a day (BID) | INTRAVENOUS | Status: DC
  Administered 2018-08-28 – 2018-08-29 (×3): 3 mL via INTRAVENOUS

## 2018-08-28 MED ORDER — ACETAMINOPHEN-CODEINE #3 300-30 MG PO TABS
1.0000 | ORAL_TABLET | ORAL | Status: DC | PRN
  Administered 2018-08-28 – 2018-08-30 (×3): 1 via ORAL
  Filled 2018-08-28 (×3): qty 1

## 2018-08-28 MED ORDER — SODIUM CHLORIDE 0.9 % WEIGHT BASED INFUSION
1.0000 mL/kg/h | INTRAVENOUS | Status: DC
  Administered 2018-08-29 – 2018-08-30 (×3): 1 mL/kg/h via INTRAVENOUS

## 2018-08-28 MED ORDER — SODIUM CHLORIDE 0.9 % IV SOLN: 250.0000 mL | INTRAVENOUS | Status: DC | PRN

## 2018-08-28 NOTE — Progress Notes (Signed)
Progress Note  Patient Name: Amanda [name] Date of Encounter: 08/28/2018  Primary Cardiologist: Dr Tresa Endo  Subjective   Still with CP from CPR; mild dyspnea  Inpatient Medications    Scheduled Meds: . amLODipine  5 mg Oral Daily  . aspirin  81 mg Oral Daily  . buPROPion  150 mg Oral Daily  . cloNIDine  0.1 mg Oral QHS  . cyclobenzaprine  10 mg Oral TID  . fluticasone  2 spray Each Nare Daily  . haloperidol  7.5 mg Oral QHS  . insulin aspart  0-9 Units Subcutaneous TID WC  . levofloxacin  750 mg Oral q1800  . lidocaine  1 patch Transdermal Q24H  . pantoprazole  40 mg Oral Daily  . topiramate  200 mg Oral BID  . traZODone  300 mg Oral QHS   Continuous Infusions:  PRN Meds: acetaminophen **OR** acetaminophen, albuterol, fluticasone, hydrALAZINE, hydrOXYzine, ondansetron **OR** ondansetron (ZOFRAN) IV, pregabalin   Vital Signs    Vitals:   08/27/18 1608 08/27/18 2208 08/28/18 0500 08/28/18 0836  BP: (!) 123/58 119/78  113/81  Pulse: 68 65  81  Resp: 12 18  18   Temp: 97.6 F (36.4 C) 97.8 F (36.6 C)  98.6 F (37 C)  TempSrc: Oral Oral  Oral  SpO2: 99% 99%  99%  Weight:   101.6 kg   Height:       No intake or output data in the 24 hours ending 08/28/18 1038 Filed Weights   08/26/18 0500 08/27/18 0500 08/28/18 0500  Weight: 102 kg 101.2 kg 101.6 kg    Telemetry    Sinus- Personally Reviewed  Physical Exam   GEN: No acute distress.  WD WN Neck: No JVD, supple Cardiac: RRR Respiratory: CTA GI: Soft, NT/ND MS: No edema Neuro:  Grossly intact   Labs    Chemistry Recent Labs  Lab 08/26/18 0249 08/27/18 0731 08/28/18 0315  NA 137 137 136  K 3.6 3.8 3.9  CL 100 106 107  CO2 23 24 22   GLUCOSE 136* 104* 117*  BUN 10 17 19   CREATININE 1.18* 1.39* 1.28*  CALCIUM 8.8* 8.9 8.8*  PROT 7.2 6.8 6.2*  ALBUMIN 3.8 3.5 3.3*  AST 97* 28 21  ALT 125* 67* 50*  ALKPHOS 97 77 73  BILITOT 0.5 0.4 0.3  GFRNONAA 53* 43* 48*  GFRAA >60 50* 55*  ANIONGAP  14 7 7      Hematology Recent Labs  Lab 08/25/18 1619 08/25/18 1627 08/27/18 0731 08/28/18 0315  WBC 11.9*  --  11.3* 7.9  RBC 3.83*  --  3.47* 3.48*  HGB 12.5 14.3 11.3* 11.3*  HCT 41.0 42.0 35.3* 36.1  MCV 107.0*  --  101.7* 103.7*  MCH 32.6  --  32.6 32.5  MCHC 30.5  --  32.0 31.3  RDW 13.1  --  13.2 13.4  PLT 257  --  226 223    Cardiac Enzymes Recent Labs  Lab 08/26/18 0249 08/26/18 0845 08/26/18 1427  TROPONINI 0.30* 0.24* 0.26*    Recent Labs  Lab 08/25/18 1625  TROPIPOC 0.51*     BNP Recent Labs  Lab 08/26/18 0249  BNP 527.1*     Patient Profile     53 y.o. female admitted following PEA arrest.  Occurred in the setting of acute respiratory distress and recent cocaine use.  Troponin minimally elevated.  Assessment & Plan    1 status post PEA arrest-occurred in the setting of respiratory distress and cocaine  abuse.  Troponin minimally elevated.  Echocardiogram shows vigorous LV function.  Dr. Tresa Endo has reviewed and has recommended cardiac catheterization.  I discussed the risks and benefits previously including myocardial infarction, CVA and death and we will plan to proceed tomorrow.  Continue ASA. Note CT did not show a pulmonary embolus.  2 possible pneumonia-antibiotics per primary care.  3 elevated troponin-minimal elevation occurring in the setting of CPR but does not appear to be consistent with acute coronary syndrome.  For catheterization as outlined above.  4 cocaine abuse-patient again counseled on avoiding.  5 hypertension-blood pressure is controlled.  Continue present medications.  6 renal insufficiency-improved on follow-up laboratories.  For questions or updates, please contact [facility] Please consult [email or web address] for contact info under        Signed, Olga Millers, MD  08/28/2018, 10:38 AM

## 2018-08-28 NOTE — Progress Notes (Signed)
[facility] Progress Note  Patient: Amanda [name] KZL:[id number]   PCP: Hoy Register, MD DOB: 06-25-1966   DOA: 08/25/2018   DOS: 08/28/2018   Date of Service: the patient was seen and examined on 08/28/2018  Brief hospital course: Pt. with PMH of Asthma, bipolar disorder, HLD and HTN; admitted on 08/25/2018, presented with complaint of unresponsiveness, was found to have possibly cocaine induced PEA arrest. Currently further plan is continue monitoring.  Subjective: Still has reproducible chest pain.  No nausea no vomiting.  LFTs getting better.  Assessment and Plan: 1. Acute respiratory failure with hypoxia likely from possible pneumonia versus CHF.  Patient has been placed on Levaquin, negative blood cultures sputum cultures procalcitonin urine for strep antigen Legionella and influenza PCR.  Currently on room air 2. PEA arrest in the setting of respiratory failure and cocaine abuse -troponin negative, echocardiogram unremarkable.  Cardiology was consulted who recommends cardiac catheterization possibly Monday.  Strongly Advised patient not to use cocaine. 3. Cocaine abuse -counseled. 4. Hypertension -on amlodipine and hydrochlorothiazide.  Will hold metoprolol since patient is positive for cocaine.  PRN IV hydralazine. 5. Hyperglycemia -4.3 hemoglobin A1c.  Currently resolved. 6. Hyperlipidemia on statins.  Will hold statins due to elevated LFTs. 7. Elevated LFTs -likely from PE and shock, currently getting better.   8. Bipolar disorder on Haldol Wellbutrin. 9. Chronic pain on Lyrica and NSAIDs.  Patient also reports chest pain, will use lidocaine patch. 10. Acute renal failure cause not clear.  Could be from hypotensive episode.  UA unremarkable.    Improving. 11. History of peptic ulcer disease and GERD. 12. History of asthma presently not wheezing was wheezing initially.  Will keep patient on PRN albuterol. 13. Morbid obesity  Body mass index is 34.06 kg/m.  Dietary  consultation 14. Chest pain.We will provide supportive measures.   Diet: cardiac diet DVT Prophylaxis: subcutaneous Heparin  Advance goals of care discussion: full code  Family Communication: no family was present at bedside, at the time of interview.   Disposition:  Discharge to home Monday after Cardiac Catheterization .  Consultants: cardiology  Procedures: Echocardiogram   Scheduled Meds: . amLODipine  5 mg Oral Daily  . aspirin  81 mg Oral Daily  . buPROPion  150 mg Oral Daily  . cloNIDine  0.1 mg Oral QHS  . cyclobenzaprine  10 mg Oral TID  . fluticasone  2 spray Each Nare Daily  . haloperidol  7.5 mg Oral QHS  . heparin injection (subcutaneous)  5,000 Units Subcutaneous Q8H  . insulin aspart  0-9 Units Subcutaneous TID WC  . levofloxacin  750 mg Oral q1800  . lidocaine  1 patch Transdermal Q24H  . pantoprazole  40 mg Oral Daily  . topiramate  200 mg Oral BID  . traZODone  300 mg Oral QHS   Continuous Infusions: PRN Meds: acetaminophen **OR** acetaminophen, acetaminophen-codeine, albuterol, fluticasone, hydrALAZINE, hydrOXYzine, ondansetron **OR** ondansetron (ZOFRAN) IV, pregabalin Antibiotics: Anti-infectives (From admission, onward)   Start     Dose/Rate Route Frequency Ordered Stop   08/26/18 1800  levofloxacin (LEVAQUIN) tablet 750 mg     750 mg Oral Daily-1800 08/26/18 0859     08/25/18 1900  levofloxacin (LEVAQUIN) IVPB 750 mg  Status:  Discontinued     750 mg 100 mL/hr over 90 Minutes Intravenous Every 24 hours 08/25/18 1848 08/26/18 0859   08/25/18 1845  levofloxacin (LEVAQUIN) IVPB 750 mg     750 mg 100 mL/hr over 90 Minutes Intravenous  Once 08/25/18  1832 08/25/18 2028       Objective: Physical Exam: Vitals:   08/27/18 2208 08/28/18 0500 08/28/18 0836 08/28/18 1838  BP: 119/78  113/81 123/73  Pulse: 65  81 84  Resp: 18  18 18   Temp: 97.8 F (36.6 C)  98.6 F (37 C) 98.1 F (36.7 C)  TempSrc: Oral  Oral Oral  SpO2: 99%  99%   Weight:   101.6 kg    Height:        Intake/Output Summary (Last 24 hours) at 08/28/2018 1918 Last data filed at 08/28/2018 1100 Gross per 24 hour  Intake 240 ml  Output -  Net 240 ml   Filed Weights   08/26/18 0500 08/27/18 0500 08/28/18 0500  Weight: 102 kg 101.2 kg 101.6 kg   General: Alert, Awake and Oriented to Time, Place and Person. Appear in mild distress, affect appropriate Eyes: PERRL, Conjunctiva normal ENT: Oral Mucosa clear moist. Neck: no JVD, no Abnormal Mass Or lumps Cardiovascular: S1 and S2 Present, no Murmur, Peripheral Pulses Present Respiratory: normal respiratory effort, Bilateral Air entry equal and Decreased, no use of accessory muscle, Clear to Auscultation, no Crackles, no wheezes Abdomen: Bowel Sound present, Soft and no tenderness, no hernia Skin: no redness, no Rash, no induration Extremities: no Pedal edema, no calf tenderness Neurologic: Grossly no focal neuro deficit. Bilaterally Equal motor strength  Data Reviewed: CBC: Recent Labs  Lab 08/25/18 1619 08/25/18 1627 08/27/18 0731 08/28/18 0315  WBC 11.9*  --  11.3* 7.9  NEUTROABS 6.4  --  7.6 4.2  HGB 12.5 14.3 11.3* 11.3*  HCT 41.0 42.0 35.3* 36.1  MCV 107.0*  --  101.7* 103.7*  PLT 257  --  226 223   Basic Metabolic Panel: Recent Labs  Lab 08/25/18 1619 08/25/18 1627 08/26/18 0249 08/27/18 0731 08/28/18 0315  NA 139 137 137 137 136  K 4.0 4.0 3.6 3.8 3.9  CL 100 101 100 106 107  CO2 19*  --  23 24 22   GLUCOSE 251* 245* 136* 104* 117*  BUN 10 10 10 17 19   CREATININE 1.46* 1.40* 1.18* 1.39* 1.28*  CALCIUM 8.6*  --  8.8* 8.9 8.8*  MG  --   --  1.6*  --  2.0    Liver Function Tests: Recent Labs  Lab 08/25/18 1619 08/26/18 0249 08/27/18 0731 08/28/18 0315  AST 215* 97* 28 21  ALT 151* 125* 67* 50*  ALKPHOS 113 97 77 73  BILITOT 0.3 0.5 0.4 0.3  PROT 7.1 7.2 6.8 6.2*  ALBUMIN 3.8 3.8 3.5 3.3*   No results for input(s): LIPASE, AMYLASE in the last 168 hours. No results for  input(s): AMMONIA in the last 168 hours. Coagulation Profile: No results for input(s): INR, PROTIME in the last 168 hours. Cardiac Enzymes: Recent Labs  Lab 08/26/18 0249 08/26/18 0845 08/26/18 1427  TROPONINI 0.30* 0.24* 0.26*   BNP (last 3 results) No results for input(s): PROBNP in the last 8760 hours. CBG: Recent Labs  Lab 08/27/18 1652 08/27/18 2145 08/28/18 0849 08/28/18 1236 08/28/18 1802  GLUCAP 119* 105* 115* 81 103*   Studies: No results found.   Time spent: 35 minutes  Author: Lynden OxfordPranav Brittany Osier, MD [facility] Pager: [phone] 08/28/2018 7:18 PM  Between 7PM-7AM, please contact night-coverage at [email or web address], password [email or web address]

## 2018-08-29 DIAGNOSIS — I469 Cardiac arrest, cause unspecified: Secondary | ICD-10-CM | POA: Diagnosis not present

## 2018-08-29 LAB — GLUCOSE, CAPILLARY
Glucose-Capillary: 93 mg/dL (ref 70–99)
Glucose-Capillary: 82 mg/dL (ref 70–99)
Glucose-Capillary: 81 mg/dL (ref 70–99)
Glucose-Capillary: 104 mg/dL — ABNORMAL HIGH (ref 70–99)

## 2018-08-29 LAB — BASIC METABOLIC PANEL
Anion gap: 8 (ref 5–15)
BUN: 18 mg/dL (ref 6–20)
CO2: 20 mmol/L — ABNORMAL LOW (ref 22–32)
Calcium: 8.7 mg/dL — ABNORMAL LOW (ref 8.9–10.3)
Chloride: 109 mmol/L (ref 98–111)
Creatinine, Ser: 1.34 mg/dL — ABNORMAL HIGH (ref 0.44–1.00)
GFR calc Af Amer: 52 mL/min — ABNORMAL LOW (ref >60)
GFR calc non Af Amer: 45 mL/min — ABNORMAL LOW (ref >60)
Glucose, Bld: 108 mg/dL — ABNORMAL HIGH (ref 70–99)
Potassium: 4 mmol/L (ref 3.5–5.1)
Sodium: 137 mmol/L (ref 135–145)

## 2018-08-29 NOTE — Progress Notes (Signed)
[facility] Progress Note  Patient: Amanda [name] FAO:[id number]   PCP: Amanda [name], Enobong, Amanda [name] DOB: May 13, 1966   DOA: 08/25/2018   DOS: 08/29/2018   Date of Service: the patient was seen and examined on 08/29/2018  Brief hospital course: Pt. with PMH of Asthma, bipolar disorder, HLD and HTN; admitted on 08/25/2018, presented with complaint of unresponsiveness, was found to have possibly cocaine induced PEA arrest. Currently further plan is continue monitoring.  Subjective: Continues to have chest pain.  No nausea no vomiting no fever no chills.  No diarrhea.  No blood in the stool.  Assessment and Plan: 1. Acute respiratory failure with hypoxia likely from possible pneumonia versus CHF.  Patient has been placed on Levaquin, negative blood cultures sputum cultures procalcitonin urine for strep antigen Legionella and influenza PCR.  Currently on room air 2. PEA arrest in the setting of respiratory failure and cocaine abuse -troponin negative, echocardiogram unremarkable.  Cardiology was consulted who recommends cardiac catheterization possibly Monday.  Strongly Advised patient not to use cocaine. 3. Cocaine abuse -counseled. 4. Hypertension -on amlodipine and hydrochlorothiazide.  Will hold metoprolol since patient is positive for cocaine.  PRN IV hydralazine. 5. Hyperglycemia -4.3 hemoglobin A1c.  Currently resolved. 6. Hyperlipidemia on statins.  Will hold statins due to elevated LFTs. 7. Elevated LFTs -likely from PE and shock, currently getting better.   8. Bipolar disorder on Haldol Wellbutrin. 9. Chronic pain on Lyrica and NSAIDs.  Patient also reports chest pain, will use lidocaine patch. 10. Acute renal failure cause not clear.  Could be from hypotensive episode.  UA unremarkable.    Improving. 11. History of peptic ulcer disease and GERD. 12. History of asthma presently not wheezing was wheezing initially.  Will keep patient on PRN albuterol. 13. Morbid obesity  Body mass index is 34.06  kg/m.  Dietary consultation 14. Chest pain.We will provide supportive measures.  Diet: cardiac diet DVT Prophylaxis: subcutaneous Heparin  Advance goals of care discussion: full code  Family Communication: no family was present at bedside, at the time of interview.   Disposition:  Discharge to home tomorrow  Consultants: cardiology  Procedures: Echocardiogram   Scheduled Meds: . amLODipine  5 mg Oral Daily  . aspirin  81 mg Oral Daily  . aspirin  81 mg Oral Pre-Cath  . buPROPion  150 mg Oral Daily  . cloNIDine  0.1 mg Oral QHS  . cyclobenzaprine  10 mg Oral TID  . fluticasone  2 spray Each Nare Daily  . haloperidol  7.5 mg Oral QHS  . heparin injection (subcutaneous)  5,000 Units Subcutaneous Q8H  . insulin aspart  0-9 Units Subcutaneous TID WC  . levofloxacin  750 mg Oral q1800  . lidocaine  1 patch Transdermal Q24H  . pantoprazole  40 mg Oral Daily  . sodium chloride flush  3 mL Intravenous Q12H  . topiramate  200 mg Oral BID  . traZODone  300 mg Oral QHS   Continuous Infusions: . sodium chloride    . sodium chloride 1 mL/kg/hr (08/29/18 0139)   PRN Meds: sodium chloride, acetaminophen **OR** acetaminophen, acetaminophen-codeine, albuterol, fluticasone, hydrALAZINE, hydrOXYzine, ondansetron **OR** ondansetron (ZOFRAN) IV, pregabalin, sodium chloride flush Antibiotics: Anti-infectives (From admission, onward)   Start     Dose/Rate Route Frequency Ordered Stop   08/26/18 1800  levofloxacin (LEVAQUIN) tablet 750 mg     750 mg Oral Daily-1800 08/26/18 0859     08/25/18 1900  levofloxacin (LEVAQUIN) IVPB 750 mg  Status:  Discontinued  750 mg 100 mL/hr over 90 Minutes Intravenous Every 24 hours 08/25/18 1848 08/26/18 0859   08/25/18 1845  levofloxacin (LEVAQUIN) IVPB 750 mg     750 mg 100 mL/hr over 90 Minutes Intravenous  Once 08/25/18 1832 08/25/18 2028       Objective: Physical Exam: Vitals:   08/28/18 1838 08/28/18 2223 08/29/18 1215 08/29/18 1817  BP:  123/73 111/71 115/82 127/83  Pulse: 84 69  77  Resp: 18 18    Temp: 98.1 F (36.7 C) 98.3 F (36.8 C)  98 F (36.7 C)  TempSrc: Oral Oral  Oral  SpO2:  100%  97%  Weight:      Height:        Intake/Output Summary (Last 24 hours) at 08/29/2018 1829 Last data filed at 08/29/2018 0300 Gross per 24 hour  Intake 237 ml  Output -  Net 237 ml   Filed Weights   08/26/18 0500 08/27/18 0500 08/28/18 0500  Weight: 102 kg 101.2 kg 101.6 kg   General: Alert, Awake and Oriented to Time, Place and Person. Appear in mild distress, affect appropriate Eyes: PERRL, Conjunctiva normal ENT: Oral Mucosa clear moist. Neck: no JVD, no Abnormal Mass Or lumps Cardiovascular: S1 and S2 Present, no Murmur, Peripheral Pulses Present Respiratory: normal respiratory effort, Bilateral Air entry equal and Decreased, no use of accessory muscle, Clear to Auscultation, no Crackles, no wheezes Abdomen: Bowel Sound present, Soft and no tenderness, no hernia Skin: no redness, no Rash, no induration Extremities: no Pedal edema, no calf tenderness Neurologic: Grossly no focal neuro deficit. Bilaterally Equal motor strength  Data Reviewed: CBC: Recent Labs  Lab 08/25/18 1619 08/25/18 1627 08/27/18 0731 08/28/18 0315  WBC 11.9*  --  11.3* 7.9  NEUTROABS 6.4  --  7.6 4.2  HGB 12.5 14.3 11.3* 11.3*  HCT 41.0 42.0 35.3* 36.1  MCV 107.0*  --  101.7* 103.7*  PLT 257  --  226 223   Basic Metabolic Panel: Recent Labs  Lab 08/25/18 1619 08/25/18 1627 08/26/18 0249 08/27/18 0731 08/28/18 0315 08/29/18 0256  NA 139 137 137 137 136 137  K 4.0 4.0 3.6 3.8 3.9 4.0  CL 100 101 100 106 107 109  CO2 19*  --  23 24 22  20*  GLUCOSE 251* 245* 136* 104* 117* 108*  BUN 10 10 10 17 19 18   CREATININE 1.46* 1.40* 1.18* 1.39* 1.28* 1.34*  CALCIUM 8.6*  --  8.8* 8.9 8.8* 8.7*  MG  --   --  1.6*  --  2.0  --     Liver Function Tests: Recent Labs  Lab 08/25/18 1619 08/26/18 0249 08/27/18 0731 08/28/18 0315  AST  215* 97* 28 21  ALT 151* 125* 67* 50*  ALKPHOS 113 97 77 73  BILITOT 0.3 0.5 0.4 0.3  PROT 7.1 7.2 6.8 6.2*  ALBUMIN 3.8 3.8 3.5 3.3*   No results for input(s): LIPASE, AMYLASE in the last 168 hours. No results for input(s): AMMONIA in the last 168 hours. Coagulation Profile: No results for input(s): INR, PROTIME in the last 168 hours. Cardiac Enzymes: Recent Labs  Lab 08/26/18 0249 08/26/18 0845 08/26/18 1427  TROPONINI 0.30* 0.24* 0.26*   BNP (last 3 results) No results for input(s): PROBNP in the last 8760 hours. CBG: Recent Labs  Lab 08/28/18 1802 08/28/18 2106 08/29/18 0727 08/29/18 1212 08/29/18 1817  GLUCAP 103* 112* 93 82 81   Studies: No results found.   Time spent: 35 minutes  Author: Edsel Petrin  Allena KatzPatel, Amanda [name] [facility] Pager: [phone] 08/29/2018 6:29 PM  Between 7PM-7AM, please contact night-coverage at [email or web address], password [email or web address]

## 2018-08-29 NOTE — Progress Notes (Signed)
Progress Note  Patient Name: Amanda [name] Date of Encounter: 08/29/2018  Primary Cardiologist: No primary care provider on file.   Subjective   The patient continues to complain of chest soreness with movement and deep inspiration.  Otherwise no shortness of breath or cough.  Inpatient Medications    Scheduled Meds: . amLODipine  5 mg Oral Daily  . aspirin  81 mg Oral Daily  . aspirin  81 mg Oral Pre-Cath  . buPROPion  150 mg Oral Daily  . cloNIDine  0.1 mg Oral QHS  . cyclobenzaprine  10 mg Oral TID  . fluticasone  2 spray Each Nare Daily  . haloperidol  7.5 mg Oral QHS  . heparin injection (subcutaneous)  5,000 Units Subcutaneous Q8H  . insulin aspart  0-9 Units Subcutaneous TID WC  . levofloxacin  750 mg Oral q1800  . lidocaine  1 patch Transdermal Q24H  . pantoprazole  40 mg Oral Daily  . sodium chloride flush  3 mL Intravenous Q12H  . topiramate  200 mg Oral BID  . traZODone  300 mg Oral QHS   Continuous Infusions: . sodium chloride    . sodium chloride 1 mL/kg/hr (08/29/18 0139)   PRN Meds: sodium chloride, acetaminophen **OR** acetaminophen, acetaminophen-codeine, albuterol, fluticasone, hydrALAZINE, hydrOXYzine, ondansetron **OR** ondansetron (ZOFRAN) IV, pregabalin, sodium chloride flush   Vital Signs    Vitals:   08/28/18 0500 08/28/18 0836 08/28/18 1838 08/28/18 2223  BP:  113/81 123/73 111/71  Pulse:  81 84 69  Resp:  18 18 18   Temp:  98.6 F (37 C) 98.1 F (36.7 C) 98.3 F (36.8 C)  TempSrc:  Oral Oral Oral  SpO2:  99%  100%  Weight: 101.6 kg     Height:        Intake/Output Summary (Last 24 hours) at 08/29/2018 0736 Last data filed at 08/29/2018 0300 Gross per 24 hour  Intake 477 ml  Output -  Net 477 ml   Filed Weights   08/26/18 0500 08/27/18 0500 08/28/18 0500  Weight: 102 kg 101.2 kg 101.6 kg    Telemetry    Normal sinus rhythm without arrhythmia- Personally Reviewed   Physical Exam  Alert, oriented, pleasant overweight woman  in no distress GEN: No acute distress.   Neck: No JVD Cardiac: RRR, no murmurs, rubs, or gallops.  Respiratory: Clear to auscultation bilaterally. GI: Soft, nontender, non-distended  MS: No edema; No deformity. Neuro:  Nonfocal  Psych: Normal affect   Labs    Chemistry Recent Labs  Lab 08/26/18 0249 08/27/18 0731 08/28/18 0315 08/29/18 0256  NA 137 137 136 137  K 3.6 3.8 3.9 4.0  CL 100 106 107 109  CO2 23 24 22  PENDING  GLUCOSE 136* 104* 117* 108*  BUN 10 17 19 18   CREATININE 1.18* 1.39* 1.28* 1.34*  CALCIUM 8.8* 8.9 8.8* 8.7*  PROT 7.2 6.8 6.2*  --   ALBUMIN 3.8 3.5 3.3*  --   AST 97* 28 21  --   ALT 125* 67* 50*  --   ALKPHOS 97 77 73  --   BILITOT 0.5 0.4 0.3  --   GFRNONAA 53* 43* 48* 45*  GFRAA >60 50* 55* 52*  ANIONGAP 14 7 7  PENDING     Hematology Recent Labs  Lab 08/25/18 1619 08/25/18 1627 08/27/18 0731 08/28/18 0315  WBC 11.9*  --  11.3* 7.9  RBC 3.83*  --  3.47* 3.48*  HGB 12.5 14.3 11.3* 11.3*  HCT 41.0  42.0 35.3* 36.1  MCV 107.0*  --  101.7* 103.7*  MCH 32.6  --  32.6 32.5  MCHC 30.5  --  32.0 31.3  RDW 13.1  --  13.2 13.4  PLT 257  --  226 223    Cardiac Enzymes Recent Labs  Lab 08/26/18 0249 08/26/18 0845 08/26/18 1427  TROPONINI 0.30* 0.24* 0.26*    Recent Labs  Lab 08/25/18 1625  TROPIPOC 0.51*     BNP Recent Labs  Lab 08/26/18 0249  BNP 527.1*     DDimer No results for input(s): DDIMER in the last 168 hours.   Radiology    No results found.  Cardiac Studies   Echo: Study Conclusions  - Left ventricle: The cavity size was normal. Wall thickness was   increased in a pattern of mild LVH. Systolic function was   vigorous. The estimated ejection fraction was in the range of 65%   to 70%. Wall motion was normal; there were no regional wall   motion abnormalities. Doppler parameters are consistent with   abnormal left ventricular relaxation (grade 1 diastolic   dysfunction). - Aortic valve: Trileaflet; mildly  thickened, mildly calcified   leaflets. - Mitral valve: Mildly calcified annulus. - Pulmonary arteries: Systolic pressure was mildly increased. PA   peak pressure: 36 mm Hg (S).  Patient Profile     53 y.o. female who presented with PEA arrest in the setting of acute respiratory distress and cocaine use  Assessment & Plan    1.  PEA arrest: Patient noted to have normal LV systolic function without regional wall motion abnormality.  Minimal troponin elevation noted.  Plans for cardiac catheterization and possible PCI today are reviewed with the patient. I have reviewed the risks, indications, and alternatives to cardiac catheterization, possible angioplasty, and stenting with the patient. Risks include but are not limited to bleeding, infection, vascular injury, stroke, myocardial infection, arrhythmia, kidney injury, radiation-related injury in the case of prolonged fluoroscopy use, emergency cardiac surgery, and death. The patient understands the risks of serious complication is 1-2 in 1000 with diagnostic cardiac cath and 1-2% or less with angioplasty/stenting.  All of her questions are answered.  She appears to be medically stable for hospital discharge as long as her cardiac catheterization demonstrates no evidence of obstructive CAD.  2.  Elevated troponin: Suspect related to cardiac arrest and demand ischemia rather than acute coronary syndrome considering flat low level troponin trend.  3.  Chronic kidney disease stage III: Creatinine is stable today at 1.34 mg/dL.   For questions or updates, please contact [facility] Please consult [email or web address] for contact info under        Signed, Tonny BollmanMichael Taner Rzepka, MD  08/29/2018, 7:36 AM

## 2018-08-29 NOTE — H&P (View-Only) (Signed)
 Progress Note  Patient Name: Amanda [name] Date of Encounter: 08/29/2018  Primary Cardiologist: No primary care provider on file.   Subjective   The patient continues to complain of chest soreness with movement and deep inspiration.  Otherwise no shortness of breath or cough.  Inpatient Medications    Scheduled Meds: . amLODipine  5 mg Oral Daily  . aspirin  81 mg Oral Daily  . aspirin  81 mg Oral Pre-Cath  . buPROPion  150 mg Oral Daily  . cloNIDine  0.1 mg Oral QHS  . cyclobenzaprine  10 mg Oral TID  . fluticasone  2 spray Each Nare Daily  . haloperidol  7.5 mg Oral QHS  . heparin injection (subcutaneous)  5,000 Units Subcutaneous Q8H  . insulin aspart  0-9 Units Subcutaneous TID WC  . levofloxacin  750 mg Oral q1800  . lidocaine  1 patch Transdermal Q24H  . pantoprazole  40 mg Oral Daily  . sodium chloride flush  3 mL Intravenous Q12H  . topiramate  200 mg Oral BID  . traZODone  300 mg Oral QHS   Continuous Infusions: . sodium chloride    . sodium chloride 1 mL/kg/hr (08/29/18 0139)   PRN Meds: sodium chloride, acetaminophen **OR** acetaminophen, acetaminophen-codeine, albuterol, fluticasone, hydrALAZINE, hydrOXYzine, ondansetron **OR** ondansetron (ZOFRAN) IV, pregabalin, sodium chloride flush   Vital Signs    Vitals:   08/28/18 0500 08/28/18 0836 08/28/18 1838 08/28/18 2223  BP:  113/81 123/73 111/71  Pulse:  81 84 69  Resp:  18 18 18  Temp:  98.6 F (37 C) 98.1 F (36.7 C) 98.3 F (36.8 C)  TempSrc:  Oral Oral Oral  SpO2:  99%  100%  Weight: 101.6 kg     Height:        Intake/Output Summary (Last 24 hours) at 08/29/2018 0736 Last data filed at 08/29/2018 0300 Gross per 24 hour  Intake 477 ml  Output -  Net 477 ml   Filed Weights   08/26/18 0500 08/27/18 0500 08/28/18 0500  Weight: 102 kg 101.2 kg 101.6 kg    Telemetry    Normal sinus rhythm without arrhythmia- Personally Reviewed   Physical Exam  Alert, oriented, pleasant overweight woman  in no distress GEN: No acute distress.   Neck: No JVD Cardiac: RRR, no murmurs, rubs, or gallops.  Respiratory: Clear to auscultation bilaterally. GI: Soft, nontender, non-distended  MS: No edema; No deformity. Neuro:  Nonfocal  Psych: Normal affect   Labs    Chemistry Recent Labs  Lab 08/26/18 0249 08/27/18 0731 08/28/18 0315 08/29/18 0256  NA 137 137 136 137  K 3.6 3.8 3.9 4.0  CL 100 106 107 109  CO2 23 24 22 PENDING  GLUCOSE 136* 104* 117* 108*  BUN 10 17 19 18  CREATININE 1.18* 1.39* 1.28* 1.34*  CALCIUM 8.8* 8.9 8.8* 8.7*  PROT 7.2 6.8 6.2*  --   ALBUMIN 3.8 3.5 3.3*  --   AST 97* 28 21  --   ALT 125* 67* 50*  --   ALKPHOS 97 77 73  --   BILITOT 0.5 0.4 0.3  --   GFRNONAA 53* 43* 48* 45*  GFRAA >60 50* 55* 52*  ANIONGAP 14 7 7 PENDING     Hematology Recent Labs  Lab 08/25/18 1619 08/25/18 1627 08/27/18 0731 08/28/18 0315  WBC 11.9*  --  11.3* 7.9  RBC 3.83*  --  3.47* 3.48*  HGB 12.5 14.3 11.3* 11.3*  HCT 41.0   42.0 35.3* 36.1  MCV 107.0*  --  101.7* 103.7*  MCH 32.6  --  32.6 32.5  MCHC 30.5  --  32.0 31.3  RDW 13.1  --  13.2 13.4  PLT 257  --  226 223    Cardiac Enzymes Recent Labs  Lab 08/26/18 0249 08/26/18 0845 08/26/18 1427  TROPONINI 0.30* 0.24* 0.26*    Recent Labs  Lab 08/25/18 1625  TROPIPOC 0.51*     BNP Recent Labs  Lab 08/26/18 0249  BNP 527.1*     DDimer No results for input(s): DDIMER in the last 168 hours.   Radiology    No results found.  Cardiac Studies   Echo: Study Conclusions  - Left ventricle: The cavity size was normal. Wall thickness was   increased in a pattern of mild LVH. Systolic function was   vigorous. The estimated ejection fraction was in the range of 65%   to 70%. Wall motion was normal; there were no regional wall   motion abnormalities. Doppler parameters are consistent with   abnormal left ventricular relaxation (grade 1 diastolic   dysfunction). - Aortic valve: Trileaflet; mildly  thickened, mildly calcified   leaflets. - Mitral valve: Mildly calcified annulus. - Pulmonary arteries: Systolic pressure was mildly increased. PA   peak pressure: 36 mm Hg (S).  Patient Profile     53 y.o. female who presented with PEA arrest in the setting of acute respiratory distress and cocaine use  Assessment & Plan    1.  PEA arrest: Patient noted to have normal LV systolic function without regional wall motion abnormality.  Minimal troponin elevation noted.  Plans for cardiac catheterization and possible PCI today are reviewed with the patient. I have reviewed the risks, indications, and alternatives to cardiac catheterization, possible angioplasty, and stenting with the patient. Risks include but are not limited to bleeding, infection, vascular injury, stroke, myocardial infection, arrhythmia, kidney injury, radiation-related injury in the case of prolonged fluoroscopy use, emergency cardiac surgery, and death. The patient understands the risks of serious complication is 1-2 in 1000 with diagnostic cardiac cath and 1-2% or less with angioplasty/stenting.  All of her questions are answered.  She appears to be medically stable for hospital discharge as long as her cardiac catheterization demonstrates no evidence of obstructive CAD.  2.  Elevated troponin: Suspect related to cardiac arrest and demand ischemia rather than acute coronary syndrome considering flat low level troponin trend.  3.  Chronic kidney disease stage III: Creatinine is stable today at 1.34 mg/dL.   For questions or updates, please contact [facility] Please consult [email or web address] for contact info under        Signed, Tonny BollmanMichael Jep Dyas, MD  08/29/2018, 7:36 AM

## 2018-08-30 ENCOUNTER — Encounter (HOSPITAL_COMMUNITY)
Admission: EM | Disposition: A | Discharge status: Home / Self Care | Payer: Self-pay | Source: Home / Self Care | Attending: Internal Medicine

## 2018-08-30 ENCOUNTER — Encounter (HOSPITAL_COMMUNITY): Payer: Self-pay | Admitting: Cardiology

## 2018-08-30 DIAGNOSIS — I214 Non-ST elevation (NSTEMI) myocardial infarction: Secondary | ICD-10-CM

## 2018-08-30 DIAGNOSIS — I469 Cardiac arrest, cause unspecified: Secondary | ICD-10-CM

## 2018-08-30 HISTORY — PX: LEFT HEART CATH AND CORONARY ANGIOGRAPHY: CATH118249

## 2018-08-30 LAB — BASIC METABOLIC PANEL
Anion gap: 7 (ref 5–15)
BUN: 16 mg/dL (ref 6–20)
CO2: 20 mmol/L — ABNORMAL LOW (ref 22–32)
Calcium: 8.7 mg/dL — ABNORMAL LOW (ref 8.9–10.3)
Chloride: 110 mmol/L (ref 98–111)
Creatinine, Ser: 1.31 mg/dL — ABNORMAL HIGH (ref 0.44–1.00)
GFR calc Af Amer: 54 mL/min — ABNORMAL LOW (ref >60)
GFR calc non Af Amer: 46 mL/min — ABNORMAL LOW (ref >60)
Glucose, Bld: 97 mg/dL (ref 70–99)
Potassium: 4.2 mmol/L (ref 3.5–5.1)
Sodium: 137 mmol/L (ref 135–145)

## 2018-08-30 LAB — CULTURE, BLOOD (ROUTINE X 2)
Culture: NO GROWTH
Culture: NO GROWTH
Special Requests: ADEQUATE

## 2018-08-30 LAB — CBC
HCT: 34.1 % — ABNORMAL LOW (ref 36.0–46.0)
Hemoglobin: 10.8 g/dL — ABNORMAL LOW (ref 12.0–15.0)
MCH: 32.7 pg (ref 26.0–34.0)
MCHC: 31.7 g/dL (ref 30.0–36.0)
MCV: 103.3 fL — ABNORMAL HIGH (ref 80.0–100.0)
Platelets: 219 10*3/uL (ref 150–400)
RBC: 3.3 MIL/uL — ABNORMAL LOW (ref 3.87–5.11)
RDW: 13.2 % (ref 11.5–15.5)
WBC: 6 10*3/uL (ref 4.0–10.5)
nRBC: 0 % (ref 0.0–0.2)

## 2018-08-30 LAB — CREATININE, SERUM
Creatinine, Ser: 1.24 mg/dL — ABNORMAL HIGH (ref 0.44–1.00)
GFR calc Af Amer: 57 mL/min — ABNORMAL LOW (ref >60)
GFR calc non Af Amer: 50 mL/min — ABNORMAL LOW (ref >60)

## 2018-08-30 LAB — GLUCOSE, CAPILLARY: Glucose-Capillary: 113 mg/dL — ABNORMAL HIGH (ref 70–99)

## 2018-08-30 SURGERY — LEFT HEART CATH AND CORONARY ANGIOGRAPHY
Anesthesia: LOCAL

## 2018-08-30 MED ORDER — FENTANYL CITRATE (PF) 100 MCG/2ML IJ SOLN
INTRAMUSCULAR | Status: AC
  Filled 2018-08-30: qty 2

## 2018-08-30 MED ORDER — ISOSORBIDE MONONITRATE ER 30 MG PO TB24: 30.0000 mg | ORAL_TABLET | Freq: Every day | ORAL | 0 refills | Status: DC

## 2018-08-30 MED ORDER — SODIUM CHLORIDE 0.9 % IV SOLN: 250.0000 mL | INTRAVENOUS | Status: DC | PRN

## 2018-08-30 MED ORDER — LEVOFLOXACIN 750 MG PO TABS: 750.0000 mg | ORAL_TABLET | Freq: Every day | ORAL | 0 refills | Status: DC

## 2018-08-30 MED ORDER — NITROGLYCERIN 1 MG/10 ML FOR IR/CATH LAB
INTRA_ARTERIAL | Status: AC
  Filled 2018-08-30: qty 10

## 2018-08-30 MED ORDER — NITROGLYCERIN 1 MG/10 ML FOR IR/CATH LAB
INTRA_ARTERIAL | Status: DC | PRN
  Administered 2018-08-30: 200 ug via INTRACORONARY

## 2018-08-30 MED ORDER — FENTANYL CITRATE (PF) 100 MCG/2ML IJ SOLN
INTRAMUSCULAR | Status: DC | PRN
  Administered 2018-08-30 (×3): 25 ug via INTRAVENOUS

## 2018-08-30 MED ORDER — SODIUM CHLORIDE 0.9 % IV SOLN: INTRAVENOUS | Status: AC

## 2018-08-30 MED ORDER — ASPIRIN EC 81 MG PO TBEC: 81.0000 mg | DELAYED_RELEASE_TABLET | Freq: Every day | ORAL | 0 refills | Status: AC

## 2018-08-30 MED ORDER — HEPARIN SODIUM (PORCINE) 1000 UNIT/ML IJ SOLN
INTRAMUSCULAR | Status: DC | PRN
  Administered 2018-08-30: 5000 [IU] via INTRAVENOUS

## 2018-08-30 MED ORDER — VERAPAMIL HCL 2.5 MG/ML IV SOLN
INTRAVENOUS | Status: AC
  Filled 2018-08-30: qty 2

## 2018-08-30 MED ORDER — ISOSORBIDE MONONITRATE ER 30 MG PO TB24
30.0000 mg | ORAL_TABLET | Freq: Every day | ORAL | Status: DC
  Administered 2018-08-30: 30 mg via ORAL
  Filled 2018-08-30: qty 1

## 2018-08-30 MED ORDER — VERAPAMIL HCL 2.5 MG/ML IV SOLN
INTRAVENOUS | Status: DC | PRN
  Administered 2018-08-30: 10 mL via INTRA_ARTERIAL

## 2018-08-30 MED ORDER — IOHEXOL 350 MG/ML SOLN
INTRAVENOUS | Status: DC | PRN
  Administered 2018-08-30: 75 mL via INTRA_ARTERIAL

## 2018-08-30 MED ORDER — MIDAZOLAM HCL 2 MG/2ML IJ SOLN
INTRAMUSCULAR | Status: DC | PRN
  Administered 2018-08-30 (×2): 1 mg via INTRAVENOUS

## 2018-08-30 MED ORDER — SODIUM CHLORIDE 0.9% FLUSH
3.0000 mL | INTRAVENOUS | Status: DC | PRN
  Administered 2018-08-30: 3 mL via INTRAVENOUS
  Filled 2018-08-30: qty 3

## 2018-08-30 MED ORDER — LIDOCAINE HCL (PF) 1 % IJ SOLN
INTRAMUSCULAR | Status: DC | PRN
  Administered 2018-08-30: 2 mL

## 2018-08-30 MED ORDER — HEPARIN (PORCINE) IN NACL 1000-0.9 UT/500ML-% IV SOLN
INTRAVENOUS | Status: DC | PRN
  Administered 2018-08-30 (×2): 500 mL

## 2018-08-30 MED ORDER — CYCLOBENZAPRINE HCL 10 MG PO TABS: 10.0000 mg | ORAL_TABLET | Freq: Three times a day (TID) | ORAL | 0 refills | Status: DC | PRN

## 2018-08-30 MED ORDER — MIDAZOLAM HCL 2 MG/2ML IJ SOLN
INTRAMUSCULAR | Status: AC
  Filled 2018-08-30: qty 2

## 2018-08-30 MED ORDER — HEPARIN (PORCINE) IN NACL 1000-0.9 UT/500ML-% IV SOLN
INTRAVENOUS | Status: AC
  Filled 2018-08-30: qty 1000

## 2018-08-30 MED ORDER — LIDOCAINE HCL (PF) 1 % IJ SOLN
INTRAMUSCULAR | Status: AC
  Filled 2018-08-30: qty 30

## 2018-08-30 MED ORDER — SODIUM CHLORIDE 0.9% FLUSH
3.0000 mL | Freq: Two times a day (BID) | INTRAVENOUS | Status: DC
  Administered 2018-08-30: 3 mL via INTRAVENOUS

## 2018-08-30 MED FILL — ISOSORBIDE MN ER 30 MG TAB: 30 | 30 days supply | Qty: 30 | Fill #0

## 2018-08-30 MED FILL — CYCLOBENZAPRINE 10 MG TAB: 10 | 10 days supply | Qty: 30 | Fill #0

## 2018-08-30 MED FILL — levoFLOXacin 750 MG TABS: 750 | 2 days supply | Qty: 2 | Fill #0

## 2018-08-30 SURGICAL SUPPLY — 11 items
CATH INFINITI 5 FR JL3.5 (CATHETERS) ×1 IMPLANT
CATH OPTITORQUE TIG 4.0 5F (CATHETERS) ×1 IMPLANT
DEVICE RAD COMP TR BAND LRG (VASCULAR PRODUCTS) ×1 IMPLANT
GLIDESHEATH SLEND SS 6F .021 (SHEATH) ×1 IMPLANT
GUIDEWIRE INQWIRE 1.5J.035X260 (WIRE) IMPLANT
INQWIRE 1.5J .035X260CM (WIRE) ×2
KIT HEART LEFT (KITS) ×2 IMPLANT
PACK CARDIAC CATHETERIZATION (CUSTOM PROCEDURE TRAY) ×2 IMPLANT
SHEATH PROBE COVER 6X72 (BAG) ×1 IMPLANT
TRANSDUCER W/STOPCOCK (MISCELLANEOUS) ×2 IMPLANT
TUBING CIL FLEX 10 FLL-RA (TUBING) ×2 IMPLANT

## 2018-08-30 NOTE — Progress Notes (Signed)
Progress Note  Patient Name: Amanda [name] Date of Encounter: 08/30/2018  Primary Cardiologist: New to Dr. Tresa Endo   Subjective   No chest pain or dyspnea.   Inpatient Medications    Scheduled Meds: . amLODipine  5 mg Oral Daily  . aspirin  81 mg Oral Daily  . buPROPion  150 mg Oral Daily  . cloNIDine  0.1 mg Oral QHS  . cyclobenzaprine  10 mg Oral TID  . fluticasone  2 spray Each Nare Daily  . haloperidol  7.5 mg Oral QHS  . heparin injection (subcutaneous)  5,000 Units Subcutaneous Q8H  . insulin aspart  0-9 Units Subcutaneous TID WC  . isosorbide mononitrate  30 mg Oral Daily  . levofloxacin  750 mg Oral q1800  . lidocaine  1 patch Transdermal Q24H  . pantoprazole  40 mg Oral Daily  . sodium chloride flush  3 mL Intravenous Q12H  . topiramate  200 mg Oral BID  . traZODone  300 mg Oral QHS   Continuous Infusions: . sodium chloride     PRN Meds: sodium chloride, acetaminophen **OR** acetaminophen, acetaminophen-codeine, albuterol, fluticasone, hydrALAZINE, hydrOXYzine, ondansetron **OR** ondansetron (ZOFRAN) IV, pregabalin, sodium chloride flush   Vital Signs    Vitals:   08/30/18 0950 08/30/18 1005 08/30/18 1020 08/30/18 1100  BP: (!) 117/57 (!) 129/56 122/60 (!) 148/85  Pulse: 87 87 87 83  Resp: 13 16 (!) 9 16  Temp:    98.4 F (36.9 C)  TempSrc:    Oral  SpO2: 98% 99% 99%   Weight:      Height:        Intake/Output Summary (Last 24 hours) at 08/30/2018 1318 Last data filed at 08/30/2018 0500 Gross per 24 hour  Intake 1334.2 ml  Output -  Net 1334.2 ml   Filed Weights   08/27/18 0500 08/28/18 0500 08/30/18 0631  Weight: 101.2 kg 101.6 kg 101.4 kg    Telemetry    Off tele post cath   ECG    N/A  Physical Exam   GEN: No acute distress.   Neck: No JVD Cardiac: RRR, no murmurs, rubs, or gallops. R radial cath site with mild hematoma.  Respiratory: Clear to auscultation bilaterally. GI: Soft, nontender, non-distended  MS: No edema; No  deformity. Neuro:  Nonfocal  Psych: Normal affect   Labs    Chemistry Recent Labs  Lab 08/26/18 0249 08/27/18 0731 08/28/18 0315 08/29/18 0256 08/30/18 0417 08/30/18 1053  NA 137 137 136 137 137  --   K 3.6 3.8 3.9 4.0 4.2  --   CL 100 106 107 109 110  --   CO2 23 24 22  20* 20*  --   GLUCOSE 136* 104* 117* 108* 97  --   BUN 10 17 19 18 16   --   CREATININE 1.18* 1.39* 1.28* 1.34* 1.31* 1.24*  CALCIUM 8.8* 8.9 8.8* 8.7* 8.7*  --   PROT 7.2 6.8 6.2*  --   --   --   ALBUMIN 3.8 3.5 3.3*  --   --   --   AST 97* 28 21  --   --   --   ALT 125* 67* 50*  --   --   --   ALKPHOS 97 77 73  --   --   --   BILITOT 0.5 0.4 0.3  --   --   --   GFRNONAA 53* 43* 48* 45* 46* 50*  GFRAA >60 50* 55* 52*  54* 57*  ANIONGAP 14 7 7 8 7   --      Hematology Recent Labs  Lab 08/27/18 0731 08/28/18 0315 08/30/18 1053  WBC 11.3* 7.9 6.0  RBC 3.47* 3.48* 3.30*  HGB 11.3* 11.3* 10.8*  HCT 35.3* 36.1 34.1*  MCV 101.7* 103.7* 103.3*  MCH 32.6 32.5 32.7  MCHC 32.0 31.3 31.7  RDW 13.2 13.4 13.2  PLT 226 223 219    Cardiac Enzymes Recent Labs  Lab 08/26/18 0249 08/26/18 0845 08/26/18 1427  TROPONINI 0.30* 0.24* 0.26*    Recent Labs  Lab 08/25/18 1625  TROPIPOC 0.51*     BNP Recent Labs  Lab 08/26/18 0249  BNP 527.1*      Radiology    No results found.  Cardiac Studies   Echo: Study Conclusions  - Left ventricle: The cavity size was normal. Wall thickness was increased in a pattern of mild LVH. Systolic function was vigorous. The estimated ejection fraction was in the range of 65% to 70%. Wall motion was normal; there were no regional wall motion abnormalities. Doppler parameters are consistent with abnormal left ventricular relaxation (grade 1 diastolic dysfunction). - Aortic valve: Trileaflet; mildly thickened, mildly calcified leaflets. - Mitral valve: Mildly calcified annulus. - Pulmonary arteries: Systolic pressure was mildly increased.  PA peak pressure: 36 mm Hg (S).  LEFT HEART CATH AND CORONARY ANGIOGRAPHY  Conclusion     LV end diastolic pressure is normal.  Prox RCA lesion is 30% stenosed.   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  Defer further management to primary team.     Patient Profile     53 y.o. female who presented with PEA arrest in the setting of acute respiratory distress and cocaine use   Assessment & Plan    1. PEA arrest - Echo showed normal LVEF, no WM abnormality, grade 1 DD. Troponin minimally elevated. 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 Added Imdur 30mg  daily. On amlodipine 5mg  daily (up titrate as blood pressure allows).   2. Elevated Troponin - Peak of troponin 0.51. cath without obstructive disease. Demand in setting PEA arrest and cocaine abuse.   3. HTN - AS above  4. AKi - Scr stable   Will sing off. Call with questions. Continue Imdur at current dose. Up-titrate Norvasc as blood pressure tolerates.  Will schedule follow up.    For questions or updates, please contact [facility] Please consult [email or web address] for contact info under        SignedManson Passey, PA  08/30/2018, 1:18 PM

## 2018-08-30 NOTE — Evaluation (Signed)
Physical Therapy Evaluation Patient Details Name: Amanda [name] MRN: [id number] DOB: [birth year] Today's Date: 08/30/2018   History of Present Illness  Patient is a 53 y/o female who presents with episode of unresponsiveness; found to have possible cocaine induced PEA arrest. s/p CPR for 5 mins. Also with respiratory failure with hypoxia likely from possible pneumonia versus CHF. Chest CT-PNA vs pulmonary edema. s/p left cardiac cath 1/7. PMH includes Asthma, bipolar disorder, HLD and HTN.  Clinical Impression  Patient presents with chest soreness, impaired cognition- impaired memory, slow processing, impaired attention- generalized deconditioning, imbalance and impaired mobility s/p above. Tolerated gait training with Min guard for safety due to mild imbalance but no overt LOB. VSS throughout and no dizziness. Pt independent PTA, lives with partner and used to be a Investment banker, operational but trying to get disability. Not sure of pt's cognitive baseline vs new impairments from possibly hypoxia. Pt has support from partner. Education re: activity recommendations at home, safety etc. Will follow acutely to maximize independence and mobility prior to return home.     Follow Up Recommendations No PT follow up;Supervision - Intermittent    Equipment Recommendations  None recommended by PT    Recommendations for Other Services       Precautions / Restrictions Precautions Precautions: Fall Restrictions Weight Bearing Restrictions: No      Mobility  Bed Mobility Overal bed mobility: Needs Assistance Bed Mobility: Supine to Sit     Supine to sit: Modified independent (Device/Increase time);HOB elevated     General bed mobility comments: no assist needed, increased time. No dizziness.  Transfers Overall transfer level: Needs assistance Equipment used: None Transfers: Sit to/from Stand Sit to Stand: Supervision         General transfer comment: Supervision for safety. Stood  from [employer], from toilet x1.   Ambulation/Gait Ambulation/Gait assistance: Min guard Gait Distance (Feet): 350 Feet Assistive device: None Gait Pattern/deviations: Step-through pattern;Decreased stride length Gait velocity: decreased   General Gait Details: Slow, guarded and mildly unsteady gait which improved with increased distance. holding onto rail at times for support. VSS throughout, HR 115 bpm  Stairs            Wheelchair Mobility    Modified Rankin (Stroke Patients Only)       Balance Overall balance assessment: Needs assistance Sitting-balance support: Feet supported;No upper extremity supported Sitting balance-Leahy Scale: Good Sitting balance - Comments: Able to donn socks sitting EOB without difficulty.    Standing balance support: No upper extremity supported;During functional activity Standing balance-Leahy Scale: Good                               Pertinent Vitals/Pain Pain Assessment: Faces Faces Pain Scale: Hurts little more Pain Location: chest Pain Descriptors / Indicators: Discomfort;Sore Pain Intervention(s): Monitored during session;Repositioned    Home Living Family/patient expects to be discharged to:: Private residence Living Arrangements: Spouse/significant other Available Help at Discharge: Family;Available 24 hours/day Type of Home: House Home Access: Level entry     Home Layout: One level Home Equipment: None      Prior Function Level of Independence: Independent         Comments: Trying to get disability. Does her own ADls/iADLS. 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 Does not drive. Takes the bus.     Hand Dominance        Extremity/Trunk Assessment   Upper Extremity Assessment Upper Extremity Assessment:  Defer to OT evaluation    Lower Extremity Assessment Lower Extremity Assessment: Generalized weakness       Communication   Communication: HOH  Cognition Arousal/Alertness:  Awake/alert Behavior During Therapy: Flat affect Overall Cognitive Status: No family/caregiver present to determine baseline cognitive functioning Area of Impairment: Memory;Problem solving;Attention                   Current Attention Level: Selective Memory: Decreased short-term memory       Problem Solving: Slow processing;Requires verbal cues General Comments: Slow to respond to all questions. Reports feeling "foggy." Does not recall anything during hospitalization. Not a great historian.       General Comments General comments (skin integrity, edema, etc.): Partner present in room during session. Reports pt is not at cognitive baseline.    Exercises     Assessment/Plan    PT Assessment Patient needs continued PT services  PT Problem List Decreased strength;Decreased balance;Decreased cognition       PT Treatment Interventions Balance training;Patient/family education;Gait training;Therapeutic exercise;Therapeutic activities    PT Goals (Current goals can be found in the Care Plan section)  Acute Rehab PT Goals Patient Stated Goal: to go home PT Goal Formulation: With patient Time For Goal Achievement: 09/13/18 Potential to Achieve Goals: Good    Frequency Min 3X/week   Barriers to discharge        Co-evaluation               AM-PAC PT "6 Clicks" Mobility  Outcome Measure Help needed turning from your back to your side while in a flat bed without using bedrails?: None Help needed moving from lying on your back to sitting on the side of a flat bed without using bedrails?: None Help needed moving to and from a bed to a chair (including a wheelchair)?: None Help needed standing up from a chair using your arms (e.g., wheelchair or bedside chair)?: None Help needed to walk in hospital room?: A Little Help needed climbing 3-5 steps with a railing? : A Little 6 Click Score: 22    End of Session Equipment Utilized During Treatment: Gait belt Activity  Tolerance: Patient tolerated treatment well Patient left: in bed;with call bell/phone within reach;with family/visitor present Nurse Communication: Mobility status PT Visit Diagnosis: Muscle weakness (generalized) (M62.81);Difficulty in walking, not elsewhere classified (R26.2);Pain Pain - part of body: (chest)    Time: 9458-5929 PT Time Calculation (min) (ACUTE ONLY): 21 min   Charges:   PT Evaluation $PT Eval Moderate Complexity: 1 Mod          Mylo Red, PT, DPT Acute Rehabilitation Services Pager [phone] Office [phone]      Blake Divine A Lanier Ensign 08/30/2018, 3:09 PM

## 2018-08-30 NOTE — Care Management Note (Addendum)
Case Management Note  Patient Details  Name: Warden FillersDonna A Amend MRN: [id number] Date of Birth: 1965-11-28  Subjective/Objective:                    Action/Plan:  PTA independent from home, pt informed CM that she just became insured by [employer] on Jan [employer].  Pt now denies barriers with affording medications.  Pt does not have PCP - Pt has appt with [facility] specified on AVS.  NO CM needs identified   Expected Discharge Date:  08/30/18               Expected Discharge Plan:  Home/Self Care  In-House Referral:     Discharge planning Services  CM Consult  Post Acute Care Choice:    Choice offered to:     DME Arranged:    DME Agency:     HH Arranged:    HH Agency:     Status of Service:  Completed, signed off  If discussed at [employer] Length of Stay Meetings, dates discussed:    Additional Comments:  Cherylann ParrClaxton, Effie Wahlert S, RN 08/30/2018, 3:21 PM

## 2018-08-30 NOTE — Interval H&P Note (Signed)
History and Physical Interval Note:  08/30/2018 7:42 AM  Amanda [name]  has presented today for surgery, with the diagnosis of NSTEMI - PEA Arrest.   The various methods of treatment have been discussed with the patient and family. After consideration of risks, benefits and other options for treatment, the patient has consented to  Procedure(s): LEFT HEART CATH AND CORONARY ANGIOGRAPHY (N/A) with possible PERCUTANEOUS CORONARY INTERVENTION as a surgical intervention .  The patient's history has been reviewed, patient examined, no change in status, stable for surgery.  I have reviewed the patient's chart and labs.  Questions were answered to the patient's satisfaction.     Cath Lab Visit (complete for each Cath Lab visit)  Clinical Evaluation Leading to the Procedure:   ACS: Yes.    Non-ACS:    Anginal Classification: CCS II  Anti-ischemic medical therapy: Minimal Therapy (1 class of medications)  Non-Invasive Test Results: No non-invasive testing performed  Prior CABG: No previous CABG   Bryan Lemmaavid [place] Bohac

## 2018-08-30 NOTE — Progress Notes (Signed)
TR BAND REMOVAL  LOCATION:    Radial rt radial  DEFLATED PER PROTOCOL:   yes  TIME BAND OFF / DRESSING APPLIED:    1015/gauze and tegaderm  SITE UPON ARRIVAL:    Level 0  SITE AFTER BAND REMOVAL:    Level 0  CIRCULATION SENSATION AND MOVEMENT:    Within Normal Limits :  Yes, rt hand and fingers warm and pink, rt arm elevated on pillow  COMMENTS:   Instructions reviewed w/patient. Reinforced not to bend/extend/use rt hand or wrist

## 2018-08-31 ENCOUNTER — Encounter: Payer: Self-pay | Admitting: Family Medicine

## 2018-09-01 MED FILL — ?SUMATRIPTAN SUCC 50 MG TAB: 50 | 13 days supply | Qty: 4 | Fill #4

## 2018-09-01 MED FILL — ?CETIRIZINE HCL 10 MG TABLE: 10 | 30 days supply | Qty: 30 | Fill #0

## 2018-09-01 MED FILL — TOPIRAMATE 100 MG TABS: 100 | 30 days supply | Qty: 120 | Fill #0

## 2018-09-01 MED FILL — MELOXICAM 15 MG TABLET: 15 | 30 days supply | Qty: 30 | Fill #1

## 2018-09-01 MED FILL — ?ATORVASTATIN 20 MG TABLET: 20 | 30 days supply | Qty: 30 | Fill #0

## 2018-09-01 MED FILL — VIT D2 1.25 MG (50,000 UNIT: 1.25 MG | 28 days supply | Qty: 4 | Fill #1

## 2018-09-01 MED FILL — HYDROCHLOROTHIAZIDE 25 MG T: 25 | 30 days supply | Qty: 30 | Fill #0

## 2018-09-01 MED FILL — $DEXILANT DR 60 MG CAPSULE: 60 | 90 days supply | Qty: 90 | Fill #0

## 2018-09-01 MED FILL — ?CLONIDINE HCL 0.1 MG TABL: 0.1 | 30 days supply | Qty: 30 | Fill #1

## 2018-09-01 MED FILL — METOPROLOL TARTRATE 100 MG: 100 | 30 days supply | Qty: 60 | Fill #0

## 2018-09-01 MED FILL — ?AMLODIPINE BESYLATE 5 MG T: 5 | 30 days supply | Qty: 30 | Fill #1

## 2018-09-01 MED FILL — BUPROPION SR 150 MG TABLET: 150 | 30 days supply | Qty: 60 | Fill #0

## 2018-09-01 NOTE — Discharge Summary (Signed)
[facility] Discharge Summary   Patient: Amanda FillersDonna A Sedgwick JXB:[id number]   PCP: Hoy RegisterNewlin, Enobong, MD DOB: [birth year]   Date of admission: 08/25/2018   Date of discharge: 08/30/2018     Discharge Diagnoses:  Principal diagnosis Cardiopulmonary arrest  Principal Problem:   Acute respiratory failure with hypoxia (HCC) Active Problems:   Respiratory arrest (HCC)   Elevated troponin   Contusion of chest   Community acquired pneumonia   Cardiac arrest (HCC)   Cocaine abuse (HCC)   Hyperglycemia   Bipolar 1 disorder (HCC)   HLD (hyperlipidemia)   Elevated LFTs   Essential hypertension   History of migraine   Non-ST elevation (NSTEMI) myocardial infarction (HCC)   PEA (Pulseless electrical activity) (HCC)   Admitted From: Home Disposition: Home  Recommendations for Outpatient Follow-up:  1. Please follow-up with PCP in 1 week, cardiology as recommended  Follow-up Information    [facility] Follow up on 09/13/2018.   Why:  1:30 for hospital follow up and medication discounts- will see Dr. Randa LynnWright Contact information: [address] [place] [phone]       Azalee CourseMeng, Hao, [place]. Go on 09/21/2018.   Specialties:  Cardiology, Radiology Why:  @2pm  for hospital follow up with Dr. Landry DykeKelly's PA Please arrive 15 minutes early  Contact information: [address] [place] [id number] [phone]          Diet recommendation: Cardiac diet  Activity: The patient is advised to gradually reintroduce usual activities.  Discharge Condition: good  Code Status: Full code  History of present illness: As per the H and P dictated on admission, "Amanda FillersDonna A Ciccone is a 53 y.o. female with history of hypertension, bipolar disorder, hyperlipidemia, asthma was brought to the ER after patient was in acute respiratory distress had gone into PEA arrest was resuscitated by CPR for 5 minutes with 1 dose of epinephrine given.   By the time patient reached the ER patient became more alert awake and was breathing by herself.  Patient states she has been feeling short of breath for the last 1 week even at rest.  At times has been having some wheezing denies any productive cough fever or chills.  Yesterday patient also started developing some chest pressure and at this point because patient's breathing became more acutely worse patient called EMS.  As per the report by the time EMS reached patient has become unresponsive and had to be given CPR and 1 dose of epinephrine through IO.  Per ER physician patient was given CPR for 5 minutes.  Patient on my questioning admits to having taken cocaine 2 days ago.  ED Course: By the time patient is the ER patient is able to breathe herself and had a rhythm.  Complain of some chest pain.  Was initially mildly confused.  Lactate was around 10 which improved with fluids.  EKG was showing sinus tachycardia point-of-care troponin is mildly elevated.  Critical care was consulted initially but since lactate improved and patient is back to baseline patient has been admitted to hospitalist service.  Labs also show elevated AST ALT.  CT angiogram of the chest done shows possibility of pneumonia versus pulmonary edema.  Note that patient has another chart which shows a normal creatinine in the last month.  Creatinine is increased today.  Patient was started on Levaquin for pneumonia."  Hospital Course:  Summary of her active problems in the hospital is as following. 1. Acute respiratory failure  with hypoxia likely from possible pneumonia. Patient has been placed on Levaquin, negative blood cultures sputum cultures procalcitonin urine for strep antigen Legionella and influenza PCR.  Currently on room air 2. PEA arrest in the setting of respiratory failure and cocaine abuse-troponin negative, echocardiogram unremarkable.  Cardiology was consulted who recommends cardiac catheterization.  Cath was  negative for any significant obstruction although did have some vasospasm and therefore Imdur was added StronglyAdvised patient not to use cocaine. 3. Cocaine abuse-counseled. 4. Hypertension-on amlodipine and hydrochlorothiazide. Will hold metoprolol, also HCTZ due to positive orthostatics. Imdur was added. 5. Hyperglycemia-4.3 hemoglobin A1c.  Currently resolved. 6. Hyperlipidemia on statins. Will hold statins due to elevated LFTs. 7. Elevated LFTs-likely from cardiac arrest and shock, currently getting better.   8. Bipolar disorder on Haldol Wellbutrin. 9. Chronic pain on Lyrica and NSAIDs.  Patient also reports chest pain, will use lidocaine patch.  Increase Flexeril frequency. 10. Acute renal failure cause not clear. Could be from hypotensive episode. UA unremarkable.   Improving. 11. History of peptic ulcer disease and GERD. 12. History of asthma presently not wheezing was wheezing initially. Will keep patient on PRN albuterol. 13. Morbid obesity  Body mass index is 34.06 kg/m.  Dietary consultation 14. Chest pain.We will provide supportive measures.  Pain control  - [employer] Controlled Substance Reporting System database was reviewed. Flexeril was given on discharge. - Patient was instructed, not to drive, operate heavy machinery, perform activities at heights, swimming or participation in water activities or provide baby sitting services while on Pain, Sleep and Anxiety Medications; until her outpatient Physician has advised to do so again.  - Also recommended to not to take more than prescribed Pain, Sleep and Anxiety Medications.  Patient was ambulatory without any assistance. On the day of the discharge the patient's vitals are stable, and no other acute medical condition were reported by patient. the patient was felt safe to be discharge at home with family.  Consultants: Cardiology, critical care Procedures: Echocardiogram, cardiac cath  DISCHARGE  MEDICATION: Allergies as of 08/30/2018      Reactions   Penicillins Anaphylaxis   Has patient had a PCN reaction causing immediate rash, facial/tongue/throat swelling, SOB or lightheadedness with hypotension: Yes Has patient had a PCN reaction causing severe rash involving mucus membranes or skin necrosis: Yes Has patient had a PCN reaction that required hospitalization Yes Has patient had a PCN reaction occurring within the last 10 years: No-more than 10 years ago If all of the above answers are "NO", then may proceed with Cephalosporin use.   Penicillins Anaphylaxis   DID THE REACTION INVOLVE: Swelling of the face/tongue/throat, SOB, or low BP? Yes Sudden or severe rash/hives, skin peeling, or the inside of the mouth or nose? Yes Did it require medical treatment? No When did it last happen? Within the past 10 years If all above answers are "NO", may proceed with cephalosporin use.   Asa [aspirin] Rash   Aspirin Rash      Medication List    STOP taking these medications   hydrochlorothiazide 25 MG tablet Commonly known as:  HYDRODIURIL   metoprolol tartrate 100 MG tablet Commonly known as:  LOPRESSOR     TAKE these medications   amLODipine 5 MG tablet Commonly known as:  NORVASC Take 5 mg by mouth daily.   aspirin EC 81 MG tablet Take 1 tablet (81 mg total) by mouth daily.   atorvastatin 20 MG tablet Commonly known as:  LIPITOR Take 20 mg by  mouth at bedtime.   buPROPion 150 MG 12 hr tablet Commonly known as:  WELLBUTRIN SR Take 150 mg by mouth daily.   cetirizine 10 MG tablet Commonly known as:  ZYRTEC Take 10 mg by mouth daily.   cloNIDine 0.1 MG tablet Commonly known as:  CATAPRES Take 0.1 mg by mouth at bedtime as needed (for hot flashes).   cyclobenzaprine 10 MG tablet Commonly known as:  FLEXERIL Take 1 tablet (10 mg total) by mouth 3 (three) times daily as needed for muscle spasms. What changed:  when to take this   DEXILANT 60 MG capsule Generic drug:   dexlansoprazole Take 60 mg by mouth daily.   fluticasone 50 MCG/ACT nasal spray Commonly known as:  FLONASE Place 2 sprays into both nostrils daily as needed for allergies or rhinitis.   haloperidol 5 MG tablet Commonly known as:  HALDOL Take 7.5 mg by mouth at bedtime.   hydrOXYzine 50 MG tablet Commonly known as:  ATARAX/VISTARIL Take 50 mg by mouth 3 (three) times daily as needed for anxiety.   isosorbide mononitrate 30 MG 24 hr tablet Commonly known as:  IMDUR Take 1 tablet (30 mg total) by mouth daily.   levofloxacin 750 MG tablet Commonly known as:  LEVAQUIN Take 1 tablet (750 mg total) by mouth daily at 6 PM.   meloxicam 15 MG tablet Commonly known as:  MOBIC Take 15 mg by mouth at bedtime.   olopatadine 0.1 % ophthalmic solution Commonly known as:  PATANOL Place 1-2 drops into both eyes 2 (two) times daily.   pregabalin 75 MG capsule Commonly known as:  LYRICA Take 75 mg by mouth 2 (two) times daily as needed (for nerve pain).   SUMAtriptan 50 MG tablet Commonly known as:  IMITREX Take 50 mg by mouth See admin instructions. Take 50 mg by mouth at onset of headache and may repeat once in 2 hours, if no relief   topiramate 100 MG tablet Commonly known as:  TOPAMAX Take 200 mg by mouth 2 (two) times daily.   traZODone 100 MG tablet Commonly known as:  DESYREL Take 300 mg by mouth at bedtime.   VENTOLIN HFA 108 (90 Base) MCG/ACT inhaler Generic drug:  albuterol Inhale 2 puffs into the lungs every 6 (six) hours as needed for wheezing or shortness of breath.   Vitamin D (Ergocalciferol) 1.25 MG (50000 UT) Caps capsule Commonly known as:  DRISDOL Take 50,000 Units by mouth every 7 (seven) days.      Allergies  Allergen Reactions  . Penicillins Anaphylaxis    Has patient had a PCN reaction causing immediate rash, facial/tongue/throat swelling, SOB or lightheadedness with hypotension: Yes Has patient had a PCN reaction causing severe rash involving mucus  membranes or skin necrosis: Yes Has patient had a PCN reaction that required hospitalization Yes Has patient had a PCN reaction occurring within the last 10 years: No-more than 10 years ago If all of the above answers are "NO", then may proceed with Cephalosporin use.   Marland Kitchen Penicillins Anaphylaxis    DID THE REACTION INVOLVE: Swelling of the face/tongue/throat, SOB, or low BP? Yes Sudden or severe rash/hives, skin peeling, or the inside of the mouth or nose? Yes Did it require medical treatment? No When did it last happen? Within the past 10 years If all above answers are "NO", may proceed with cephalosporin use.    Jonne Ply [Aspirin] Rash  . Aspirin Rash   Discharge Instructions    Diet - low  sodium heart healthy   Complete by:  As directed    Discharge instructions   Complete by:  As directed    It is important that you read the given instructions as well as go over your medication list with RN to help you understand your care after this hospitalization.  Discharge Instructions: Please follow-up with PCP in 1-2 weeks  Please request your primary care physician to go over all Hospital Tests and Procedure/Radiological results at the follow up. Please get all Hospital records sent to your PCP by signing hospital release before you go home.   Do not drive, operating heavy machinery, perform activities at heights, swimming or participation in water activities or provide baby sitting services while you are on Pain, Sleep and Anxiety Medications; until you have been seen by Primary Care Physician or a Neurologist and advised to do so again. Do not take more than prescribed Pain, Sleep and Anxiety Medications. You were cared for by a hospitalist during your hospital stay. If you have any questions about your discharge medications or the care you received while you were in the hospital after you are discharged, you can call the unit @UNIT @ you were admitted to and ask to speak with the hospitalist  on call if the hospitalist that took care of you is not available.  Once you are discharged, your primary care physician will handle any further medical issues. Please note that NO REFILLS for any discharge medications will be authorized once you are discharged, as it is imperative that you return to your primary care physician (or establish a relationship with a primary care physician if you do not have one) for your aftercare needs so that they can reassess your need for medications and monitor your lab values. You Must read complete instructions/literature along with all the possible adverse reactions/side effects for all the Medicines you take and that have been prescribed to you. Take any new Medicines after you have completely understood and accept all the possible adverse reactions/side effects. Wear Seat belts while driving. If you have smoked or chewed Tobacco in the last 2 yrs please stop smoking and/or stop any Recreational drug use.  If you drink alcohol, please moderate the use and do not drive, operating heavy machinery, perform activities at heights, swimming or participation in water activities or provide baby sitting services under influence.   Increase activity slowly   Complete by:  As directed      Discharge Exam: Filed Weights   08/27/18 0500 08/28/18 0500 08/30/18 0631  Weight: 101.2 kg 101.6 kg 101.4 kg   Vitals:   08/30/18 1357 08/30/18 1402  BP: 136/71 123/73  Pulse: 90 (!) 101  Resp:    Temp:    SpO2:     General: Appear in mild distress, no Rash; Oral Mucosa moist. Cardiovascular: S1 and S2 Present, no Murmur, no JVD Respiratory: Bilateral Air entry present and Clear to Auscultation, no Crackles, no wheezes Abdomen: Bowel Sound present, Soft and no tenderness Extremities: no Pedal edema, no calf tenderness Neurology: Grossly no focal neuro deficit.  The results of significant diagnostics from this hospitalization (including imaging, microbiology, ancillary  and laboratory) are listed below for reference.    Significant Diagnostic Studies: Ct Head Wo Contrast  Result Date: 08/25/2018 CLINICAL DATA:  Altered mental status.  Post CPR. EXAM: CT HEAD WITHOUT CONTRAST TECHNIQUE: Contiguous axial images were obtained from the base of the skull through the vertex without intravenous contrast. COMPARISON:  None. FINDINGS: Brain: No  evidence of acute infarction, hemorrhage, hydrocephalus, extra-axial collection or mass lesion/mass effect. Vascular: No hyperdense vessel or unexpected calcification. Skull: Normal. Negative for fracture or focal lesion. Sinuses/Orbits: Near complete opacification of the left mastoid air cells. No osseous destruction. The right mastoid air cells and paranasal sinuses are clear. The orbits are unremarkable. Other: None. IMPRESSION: 1.  No acute intracranial abnormality. 2. Near complete opacification of the left mastoid air cells. Electronically Signed   By: Obie Dredge M.D.   On: 08/25/2018 18:22   Ct Angio Chest Pe W/cm &/or Wo Cm  Result Date: 08/25/2018 CLINICAL DATA:  Altered level of consciousness. Respiratory distress. Patient underwent 5 minutes of CPR in route to the hospital. Central chest pain. EXAM: CT ANGIOGRAPHY CHEST WITH CONTRAST TECHNIQUE: Multidetector CT imaging of the chest was performed using the standard protocol during bolus administration of intravenous contrast. Multiplanar CT image reconstructions and MIPs were obtained to evaluate the vascular anatomy. CONTRAST:  ISOVUE-370 IOPAMIDOL (ISOVUE-370) INJECTION 76% COMPARISON:  Current chest radiographs. FINDINGS: Cardiovascular: Satisfactory opacification of the pulmonary arteries to the segmental level. No evidence of pulmonary embolism. Normal heart size. No pericardial effusion. No coronary artery calcifications. Great vessels are normal in caliber. No aortic dissection. Minimal aortic atherosclerosis along the arch. Mediastinum/Nodes: No enlarged  mediastinal, hilar, or axillary lymph nodes. Thyroid gland, trachea, and esophagus demonstrate no significant findings. Lungs/Pleura: There are patchy areas of predominantly peribronchovascular ground-glass airspace opacity in the upper lobes, right greater than left. There is additional peribronchovascular airspace opacity in the posterior lower lobes, right slightly greater than left. Remainder of the lungs is clear. Minimal right pleural effusion. No pneumothorax. Upper Abdomen: No acute abnormality. Musculoskeletal: No chest wall abnormality. No acute or significant osseous findings. Review of the MIP images confirms the above findings. IMPRESSION: 1. No evidence of a pulmonary embolism. 2. Bilateral areas of peribronchovascular ground-glass opacity in the upper lobes and lower lobes, right greater than left. A component of the lower lobe opacity is likely atelectasis. Findings are consistent with multifocal pneumonia. Asymmetric pulmonary edema is felt less likely but not excluded. Aortic Atherosclerosis (ICD10-I70.0). Electronically Signed   By: Amie Portland M.D.   On: 08/25/2018 18:21   Dg Chest Portable 1 View  Result Date: 08/25/2018 CLINICAL DATA:  Shortness of breath. EXAM: PORTABLE CHEST 1 VIEW COMPARISON:  None. FINDINGS: Provided date of birth 08/24/1876. 1559 hours. The heart size is at the upper limits of normal for portable technique. The pulmonary vasculature is ill-defined and there are diffuse bilateral pulmonary opacities most consistent with acute congestive heart failure. No consolidation, pneumothorax or significant pleural effusion identified. The bones appear unremarkable. External pacer and telemetry leads are in place. IMPRESSION: Probable congestive heart failure. No comparison studies are currently available. Provided demographics are incorrect. If corrected and prior studies can be located, this report can be addended. Electronically Signed   By: Carey Bullocks M.D.   On:  08/25/2018 16:16    Microbiology: Recent Results (from the past 240 hour(s))  Blood Culture (routine x 2)     Status: None   Collection Time: 08/25/18  6:46 PM  Result Value Ref Range Status   Specimen Description BLOOD SITE NOT SPECIFIED  Final   Special Requests   Final    BOTTLES DRAWN AEROBIC AND ANAEROBIC Blood Culture adequate volume   Culture   Final    NO GROWTH 5 DAYS Performed at [facility], [address] [place], [place] [address]  Report Status 08/30/2018 FINAL  Final  Blood Culture (routine x 2)     Status: None   Collection Time: 08/25/18  6:54 PM  Result Value Ref Range Status   Specimen Description BLOOD SITE NOT SPECIFIED  Final   Special Requests   Final    BOTTLES DRAWN AEROBIC ONLY Blood Culture results may not be optimal due to an inadequate volume of blood received in culture bottles   Culture   Final    NO GROWTH 5 DAYS Performed at [facility], [address], [place], [place] [address]    Report Status 08/30/2018 FINAL  Final  MRSA PCR Screening     Status: None   Collection Time: 08/26/18  3:49 AM  Result Value Ref Range Status   MRSA by PCR NEGATIVE NEGATIVE Final    Comment:        The GeneXpert MRSA Assay (FDA approved for NASAL specimens only), is one component of a comprehensive MRSA colonization surveillance program. It is not intended to diagnose MRSA infection nor to guide or monitor treatment for MRSA infections. Performed at [facility], [address] [place], [place] [address]      Labs: CBC: Recent Labs  Lab 08/27/18 0731 08/28/18 0315 08/30/18 1053  WBC 11.3* 7.9 6.0  NEUTROABS 7.6 4.2  --   HGB 11.3* 11.3* 10.8*  HCT 35.3* 36.1 34.1*  MCV 101.7* 103.7* 103.3*  PLT 226 223 219   Basic Metabolic Panel: Recent Labs  Lab 08/26/18 0249 08/27/18 0731 08/28/18 0315 08/29/18 0256 08/30/18 0417 08/30/18 1053  NA 137 137 136 137 137  --   K 3.6 3.8 3.9 4.0 4.2  --   CL 100 106 107 109 110   --   CO2 23 24 22  20* 20*  --   GLUCOSE 136* 104* 117* 108* 97  --   BUN 10 17 19 18 16   --   CREATININE 1.18* 1.39* 1.28* 1.34* 1.31* 1.24*  CALCIUM 8.8* 8.9 8.8* 8.7* 8.7*  --   MG 1.6*  --  2.0  --   --   --    Liver Function Tests: Recent Labs  Lab 08/26/18 0249 08/27/18 0731 08/28/18 0315  AST 97* 28 21  ALT 125* 67* 50*  ALKPHOS 97 77 73  BILITOT 0.5 0.4 0.3  PROT 7.2 6.8 6.2*  ALBUMIN 3.8 3.5 3.3*   No results for input(s): LIPASE, AMYLASE in the last 168 hours. No results for input(s): AMMONIA in the last 168 hours. Cardiac Enzymes: Recent Labs  Lab 08/26/18 0249 08/26/18 0845 08/26/18 1427  TROPONINI 0.30* 0.24* 0.26*   BNP (last 3 results) Recent Labs    08/26/18 0249  BNP 527.1*   CBG: Recent Labs  Lab 08/29/18 0727 08/29/18 1212 08/29/18 1817 08/29/18 2145 08/30/18 1136  GLUCAP 93 82 81 104* 113*   Time spent: 35 minutes  Signed:  Lynden Oxford  [facility] 08/30/2018

## 2018-09-13 ENCOUNTER — Encounter: Payer: Self-pay | Admitting: Critical Care Medicine

## 2018-09-13 ENCOUNTER — Ambulatory Visit: Payer: BLUE CROSS/BLUE SHIELD | Attending: Critical Care Medicine | Admitting: Critical Care Medicine

## 2018-09-13 VITALS — BP 102/71 | HR 67 | Temp 97.7°F | Ht 68.0 in | Wt 221.0 lb

## 2018-09-13 DIAGNOSIS — Z6834 Body mass index (BMI) 34.0-34.9, adult: Secondary | ICD-10-CM | POA: Diagnosis not present

## 2018-09-13 DIAGNOSIS — Z79899 Other long term (current) drug therapy: Secondary | ICD-10-CM | POA: Insufficient documentation

## 2018-09-13 DIAGNOSIS — R7989 Other specified abnormal findings of blood chemistry: Secondary | ICD-10-CM

## 2018-09-13 DIAGNOSIS — F14288 Cocaine dependence with other cocaine-induced disorder: Secondary | ICD-10-CM

## 2018-09-13 DIAGNOSIS — I10 Essential (primary) hypertension: Secondary | ICD-10-CM | POA: Insufficient documentation

## 2018-09-13 DIAGNOSIS — Z713 Dietary counseling and surveillance: Secondary | ICD-10-CM | POA: Insufficient documentation

## 2018-09-13 DIAGNOSIS — R945 Abnormal results of liver function studies: Secondary | ICD-10-CM | POA: Diagnosis not present

## 2018-09-13 DIAGNOSIS — F1420 Cocaine dependence, uncomplicated: Secondary | ICD-10-CM | POA: Insufficient documentation

## 2018-09-13 DIAGNOSIS — I214 Non-ST elevation (NSTEMI) myocardial infarction: Secondary | ICD-10-CM | POA: Diagnosis not present

## 2018-09-13 DIAGNOSIS — F17210 Nicotine dependence, cigarettes, uncomplicated: Secondary | ICD-10-CM | POA: Diagnosis not present

## 2018-09-13 DIAGNOSIS — J9601 Acute respiratory failure with hypoxia: Secondary | ICD-10-CM | POA: Diagnosis not present

## 2018-09-13 DIAGNOSIS — I2510 Atherosclerotic heart disease of native coronary artery without angina pectoris: Secondary | ICD-10-CM | POA: Diagnosis not present

## 2018-09-13 DIAGNOSIS — E785 Hyperlipidemia, unspecified: Secondary | ICD-10-CM | POA: Insufficient documentation

## 2018-09-13 DIAGNOSIS — X58XXXA Exposure to other specified factors, initial encounter: Secondary | ICD-10-CM | POA: Insufficient documentation

## 2018-09-13 DIAGNOSIS — F1020 Alcohol dependence, uncomplicated: Secondary | ICD-10-CM | POA: Diagnosis not present

## 2018-09-13 DIAGNOSIS — F3162 Bipolar disorder, current episode mixed, moderate: Secondary | ICD-10-CM | POA: Diagnosis not present

## 2018-09-13 DIAGNOSIS — I469 Cardiac arrest, cause unspecified: Secondary | ICD-10-CM | POA: Insufficient documentation

## 2018-09-13 DIAGNOSIS — R739 Hyperglycemia, unspecified: Secondary | ICD-10-CM | POA: Diagnosis not present

## 2018-09-13 DIAGNOSIS — Z8249 Family history of ischemic heart disease and other diseases of the circulatory system: Secondary | ICD-10-CM | POA: Insufficient documentation

## 2018-09-13 DIAGNOSIS — M1990 Unspecified osteoarthritis, unspecified site: Secondary | ICD-10-CM | POA: Diagnosis not present

## 2018-09-13 DIAGNOSIS — Z880 Allergy status to penicillin: Secondary | ICD-10-CM | POA: Insufficient documentation

## 2018-09-13 DIAGNOSIS — Z8674 Personal history of sudden cardiac arrest: Secondary | ICD-10-CM

## 2018-09-13 DIAGNOSIS — F419 Anxiety disorder, unspecified: Secondary | ICD-10-CM | POA: Insufficient documentation

## 2018-09-13 DIAGNOSIS — S20219A Contusion of unspecified front wall of thorax, initial encounter: Secondary | ICD-10-CM | POA: Insufficient documentation

## 2018-09-13 DIAGNOSIS — Z886 Allergy status to analgesic agent status: Secondary | ICD-10-CM | POA: Insufficient documentation

## 2018-09-13 DIAGNOSIS — Z7982 Long term (current) use of aspirin: Secondary | ICD-10-CM | POA: Insufficient documentation

## 2018-09-13 DIAGNOSIS — G8929 Other chronic pain: Secondary | ICD-10-CM | POA: Insufficient documentation

## 2018-09-13 DIAGNOSIS — Z8711 Personal history of peptic ulcer disease: Secondary | ICD-10-CM | POA: Insufficient documentation

## 2018-09-13 DIAGNOSIS — K219 Gastro-esophageal reflux disease without esophagitis: Secondary | ICD-10-CM | POA: Insufficient documentation

## 2018-09-13 DIAGNOSIS — J4540 Moderate persistent asthma, uncomplicated: Secondary | ICD-10-CM | POA: Insufficient documentation

## 2018-09-13 DIAGNOSIS — J189 Pneumonia, unspecified organism: Secondary | ICD-10-CM | POA: Diagnosis not present

## 2018-09-13 DIAGNOSIS — S20219D Contusion of unspecified front wall of thorax, subsequent encounter: Secondary | ICD-10-CM

## 2018-09-13 DIAGNOSIS — Z791 Long term (current) use of non-steroidal anti-inflammatories (NSAID): Secondary | ICD-10-CM | POA: Insufficient documentation

## 2018-09-13 DIAGNOSIS — G629 Polyneuropathy, unspecified: Secondary | ICD-10-CM | POA: Diagnosis not present

## 2018-09-13 DIAGNOSIS — Z8679 Personal history of other diseases of the circulatory system: Secondary | ICD-10-CM

## 2018-09-13 MED ORDER — LEVOFLOXACIN 750 MG PO TABS: 750.0000 mg | ORAL_TABLET | Freq: Every day | ORAL | 0 refills | Status: AC

## 2018-09-13 NOTE — Assessment & Plan Note (Signed)
Ongoing gastroesophageal reflux disease  We will continue proton pump inhibitor

## 2018-09-13 NOTE — Assessment & Plan Note (Signed)
Non-STEMI in a patient with coronary vasospasm and cocaine use  Continue Imdur

## 2018-09-13 NOTE — Assessment & Plan Note (Signed)
Community-acquired pneumonia likely on presentation now resolved

## 2018-09-13 NOTE — Assessment & Plan Note (Signed)
Bipolar disorder currently stable on current medication profile

## 2018-09-13 NOTE — Assessment & Plan Note (Signed)
History of cardiac arrest with PEA and coronary vasospasm with cocaine use  Patient improved at this time  Patient will continue Imdur for now and has been encouraged to keep upcoming cardiology appointment

## 2018-09-13 NOTE — Patient Instructions (Signed)
Laboratory will include a complete metabolic panel and blood count Please get the 81 mg aspirin filled and begin that daily Finish your course of Levaquin as prescribed 1 daily till gone Keep your cardiology appointment on January 29 as scheduled  Continue to stay on your blood pressure medicines as prescribed  Follow-up with Dr. Alvis Lemmings in 3 weeks

## 2018-09-13 NOTE — Assessment & Plan Note (Signed)
History of severe alcohol use in the past apparently now is abstinent

## 2018-09-13 NOTE — Assessment & Plan Note (Signed)
Ongoing hypertension that has been poorly controlled previously now under improved control with compliance to medications  Continue current medication profile

## 2018-09-13 NOTE — Progress Notes (Signed)
Subjective:    Patient ID: Amanda [name], female    DOB: 20-Oct-1965, 53 y.o.   MRN: [id number]  53 y.o.F s/p PEA  cardiac arrest. Adm 08/25/18 d/c 08/30/18  No CAD but did have vasospasm.  Cocaine use HTN, NSTEMI,did not get intubated , awoke immediately after arrest in ED, GERD CAP asthma.  Below is segments of the discharge summary and hospital presentation  Discharge Diagnoses:  Principal diagnosis Cardiopulmonary arrest  Principal Problem:   Acute respiratory failure with hypoxia (HCC) Active Problems:   Respiratory arrest (HCC)   Elevated troponin   Contusion of chest   Community acquired pneumonia   Cardiac arrest (HCC)   Cocaine abuse (HCC)   Hyperglycemia   Bipolar 1 disorder (HCC)   HLD (hyperlipidemia)   Elevated LFTs   Essential hypertension   History of migraine   Non-ST elevation (NSTEMI) myocardial infarction (HCC)   PEA (Pulseless electrical activity) (HCC)  History of present illness: As per the H and P dictated on admission, "Amanda Junk Dunstonis a 53 y.o.femalewithhistory of hypertension, bipolar disorder, hyperlipidemia, asthma was brought to the ER after patient was in acute respiratory distress had gone into PEA arrest was resuscitated by CPR for 5 minutes with 1 dose of epinephrine given. By the time patient reached the ER patient became more alert awake and was breathing by herself.  Patient states she has been feeling short of breath for the last 1 week even at rest. At times has been having some wheezing denies any productive cough fever or chills. Yesterday patient also started developing some chest pressure and at this point because patient's breathing became more acutely worse patient called EMS. As per the report by the time EMS reached patient has become unresponsive and had to be given CPR and 1 dose of epinephrine through IO. Per ER physician patient was given CPR for 5 minutes.  Patient on my questioning admits to having taken cocaine 2  days ago.  ED Course:By the time patient is the ER patient is able to breathe herself and had a rhythm. Complain of some chest pain. Was initially mildly confused. Lactate was around 10 which improved with fluids. EKG was showing sinus tachycardia point-of-care troponin is mildly elevated. Critical care was consulted initially but since lactate improved and patient is back to baseline patient has been admitted to hospitalist service. Labs also show elevated AST ALT. CT angiogram of the chest done shows possibility of pneumonia versus pulmonary edema.Note that patient has another chart which shows a normal creatinine in the last month. Creatinine is increased today. Patient was started on Levaquin for pneumonia."  Hospital Course:  Summary of her active problems in the hospital is as following. 1. Acute respiratory failure with hypoxia likely from possible pneumonia. Patient has been placed on Levaquin, negative blood cultures sputum cultures procalcitonin urine for strep antigen Legionella and influenza PCR. Currently on room air 2. PEA arrest in the setting of respiratory failure and cocaine abuse-troponin negative, echocardiogram unremarkable. Cardiology was consulted who recommends cardiac catheterization.  Cath was negative for any significant obstruction although did have some vasospasm and therefore Imdur was addedStronglyAdvised patient not to use cocaine. 3. Cocaine abuse-counseled. 4. Hypertension-on amlodipine and hydrochlorothiazide. Will hold metoprolol, also HCTZ due to positive orthostatics. Imdur was added. 5. Hyperglycemia-4.3 hemoglobin A1c. Currently resolved. 6. Hyperlipidemia on statins. Will hold statins due to elevated LFTs. 7. Elevated LFTs-likely from cardiac arrest and shock, currently getting better.  8. Bipolar disorder on Haldol Wellbutrin. 9.  Chronic pain on Lyrica and NSAIDs. Patient also reports chest pain, will use lidocaine patch.  Increase  Flexeril frequency. 10. Acute renal failure cause not clear. Could be from hypotensive episode. UA unremarkable. Improving. 11. History of peptic ulcer disease and GERD. 12. History of asthma presently not wheezing was wheezing initially. Will keep patient on PRN albuterol. 13. Morbid obesityBody mass index is 34.06 kg/m.Dietary consultation 14. Chest pain.We will provide supportive measures.  Now: Since discharge the patient notes she is dyspneic if she walks very fast.  She notes fatigue.  There is anterior chest soreness where she had CPR.  She notes a slight dry cough.  There is no wheezing.  I offered a flu vaccine and she denied wanting the flu vaccine and refused the vaccine.    She is just now getting her medications filled from discharge.  She no longer is using cocaine.  She is working on smoking cessation.   Past Medical History:  Diagnosis Date  . Anxiety   . Arthritis    lower back  . Asthma   . Bipolar disorder (HCC)   . Carpal tunnel syndrome, bilateral   . Chronic back pain   . Depression   . Diverticulosis   . Fx. left wrist   . GERD (gastroesophageal reflux disease)   . Heart murmur    "born with"  . Heart murmur   . HLD (hyperlipidemia)   . Hypertension   . IBS (irritable bowel syndrome)   . Internal hemorrhoids   . Muscle spasms of neck   . Neuropathy   . PUD (peptic ulcer disease)      Family History  Problem Relation Age of Onset  . Pancreatic cancer Mother   . CAD Father   . Diabetes Mellitus II Father   . Heart failure Father   . Diabetes Father   . Breast cancer Paternal Grandmother   . HIV/AIDS Brother   . Other Sister        Brain tumor  . Colon cancer Neg Hx      Social History   Socioeconomic History  . Marital status: Single    Spouse name: Not on file  . Number of children: 0  . Years of education: 64  . Highest education level: Not on file  Occupational History  . Occupation: [employer]    Comment: [employer]  Social  Needs  . Financial resource strain: Not on file  . Food insecurity:    Worry: Not on file    Inability: Not on file  . Transportation needs:    Medical: Not on file    Non-medical: Not on file  Tobacco Use  . Smoking status: Current Some Day Smoker    Types: Cigarettes  . Smokeless tobacco: Never Used  . Tobacco comment: "takes a puff sometimes"  Substance and Sexual Activity  . Alcohol use: Not Currently    Comment: Last drink 6 months ago.  . Drug use: Yes    Types: Cocaine, "Crack" cocaine    Comment: last used two years ago  . Sexual activity: Yes    Partners: Female  Lifestyle  . Physical activity:    Days per week: Not on file    Minutes per session: Not on file  . Stress: Not on file  Relationships  . Social connections:    Talks on phone: Not on file    Gets together: Not on file    Attends religious service: Not on file    Active  member of club or organization: Not on file    Attends meetings of clubs or organizations: Not on file    Relationship status: Not on file  . Intimate partner violence:    Fear of current or ex partner: Not on file    Emotionally abused: Not on file    Physically abused: Not on file    Forced sexual activity: Not on file  Other Topics Concern  . Not on file  Social History Narrative   ** Merged History Encounter **       Pt lives at home alone. She has a HS education level and does not have any children.  She drinks 2 cups of caffeine daily.     Allergies  Allergen Reactions  . Penicillins Anaphylaxis    Has patient had a PCN reaction causing immediate rash, facial/tongue/throat swelling, SOB or lightheadedness with hypotension: Yes Has patient had a PCN reaction causing severe rash involving mucus membranes or skin necrosis: Yes Has patient had a PCN reaction that required hospitalization Yes Has patient had a PCN reaction occurring within the last 10 years: No-more than 10 years ago If all of the above answers are "NO", then  may proceed with Cephalosporin use.   Marland Kitchen. Penicillins Anaphylaxis    DID THE REACTION INVOLVE: Swelling of the face/tongue/throat, SOB, or low BP? Yes Sudden or severe rash/hives, skin peeling, or the inside of the mouth or nose? Yes Did it require medical treatment? No When did it last happen? Within the past 10 years If all above answers are "NO", may proceed with cephalosporin use.    Jonne Ply. Asa [Aspirin] Rash  . Aspirin Rash     Outpatient Medications Prior to Visit  Medication Sig Dispense Refill  . amLODipine (NORVASC) 5 MG tablet Take 1 tablet (5 mg total) by mouth daily. 30 tablet 3  . atorvastatin (LIPITOR) 20 MG tablet Take 1 tablet (20 mg total) by mouth daily. 30 tablet 5  . buPROPion (WELLBUTRIN SR) 150 MG 12 hr tablet Take 150 mg by mouth 2 (two) times daily.    . cetirizine (ZYRTEC) 10 MG tablet Take 1 tablet (10 mg total) by mouth daily. 30 tablet 5  . cloNIDine (CATAPRES) 0.1 MG tablet Take 1 tablet (0.1 mg total) by mouth at bedtime as needed. For hot flashes 30 tablet 3  . colestipol (COLESTID) 1 g tablet Take 1 tablet (1 g total) by mouth every morning. 30 tablet 4  . cyclobenzaprine (FLEXERIL) 10 MG tablet Take 1 tablet (10 mg total) by mouth 2 (two) times daily. prn 60 tablet 3  . dexlansoprazole (DEXILANT) 60 MG capsule Take 1 capsule (60 mg total) by mouth daily. 30 capsule 2  . divalproex (DEPAKOTE) 500 MG DR tablet Take 500 mg by mouth 3 (three) times daily.    . ergocalciferol (DRISDOL) 1.25 MG (50000 UT) capsule Take 1 capsule (50,000 Units total) by mouth once a week. 9 capsule 1  . fluticasone (FLONASE) 50 MCG/ACT nasal spray PLACE 2 SPRAYS INTO BOTH NOSTRILS DAILY. 16 g 1  . haloperidol (HALDOL) 5 MG tablet Take 1 tablet by mouth daily.  0  . hydrochlorothiazide (HYDRODIURIL) 25 MG tablet Take 1 tablet (25 mg total) by mouth daily. 30 tablet 3  . hydrOXYzine (ATARAX/VISTARIL) 50 MG tablet Take 1 tablet (50 mg total) by mouth 3 (three) times daily as needed for  anxiety or itching. 90 tablet 2  . isosorbide mononitrate (IMDUR) 30 MG 24 hr tablet Take 1 tablet (  30 mg total) by mouth daily. 30 tablet 0  . metoprolol tartrate (LOPRESSOR) 100 MG tablet Take 1 tablet (100 mg total) by mouth 2 (two) times daily. 60 tablet 3  . naproxen (NAPROSYN) 500 MG tablet Take 1 tablet (500 mg total) by mouth 2 (two) times daily with a meal. 30 tablet 3  . olopatadine (PATANOL) 0.1 % ophthalmic solution PLACE 1 DROP INTO BOTH EYES 2 TIMES DAILY. 5 mL 0  . pregabalin (LYRICA) 75 MG capsule Take 1 capsule (75 mg total) by mouth 2 (two) times daily. 180 capsule 1  . SUMAtriptan (IMITREX) 50 MG tablet TAKE 50 MG ORALLY AT THE ONSET OF A MIGRAINE. MAY REPEAT IN 2 HOURS IF HEADACHE PERSISTS OR RECURS. MAXIMUM DAILY DOSE 200 MG 10 tablet 3  . topiramate (TOPAMAX) 100 MG tablet Take 200 mg by mouth 2 (two) times daily.    . traZODone (DESYREL) 300 MG tablet Take 1 tablet (300 mg total) by mouth at bedtime. 30 tablet 0  . VENTOLIN HFA 108 (90 Base) MCG/ACT inhaler INHALE 2 PUFFS INTO THE LUNGS EVERY 6 HOURS AS NEEDED FOR WHEEZING OR SHORTNESS OF BREATH. 18 g 0  . albuterol (VENTOLIN HFA) 108 (90 Base) MCG/ACT inhaler Inhale 2 puffs into the lungs every 6 (six) hours as needed for wheezing or shortness of breath.     . cloNIDine (CATAPRES) 0.1 MG tablet Take 0.1 mg by mouth at bedtime as needed (for hot flashes).     . fluticasone (FLONASE) 50 MCG/ACT nasal spray Place 2 sprays into both nostrils daily as needed for allergies or rhinitis.    Marland Kitchen aspirin EC 81 MG tablet Take 1 tablet (81 mg total) by mouth daily. (Patient not taking: Reported on 09/13/2018) 150 tablet 0  . meloxicam (MOBIC) 15 MG tablet Take 1 tablet (15 mg total) by mouth daily. (Patient not taking: Reported on 09/13/2018) 30 tablet 3  . amLODipine (NORVASC) 5 MG tablet Take 5 mg by mouth daily.    Marland Kitchen atorvastatin (LIPITOR) 20 MG tablet Take 20 mg by mouth at bedtime.    Marland Kitchen buPROPion (WELLBUTRIN SR) 150 MG 12 hr tablet Take  150 mg by mouth daily.    . carbamide peroxide (DEBROX) 6.5 % OTIC solution Place 5 drops into the right ear 2 (two) times daily. 15 mL 0  . cetirizine (ZYRTEC) 10 MG tablet Take 10 mg by mouth daily.    . cyclobenzaprine (FLEXERIL) 10 MG tablet Take 1 tablet (10 mg total) by mouth 3 (three) times daily as needed for muscle spasms. 30 tablet 0  . dexlansoprazole (DEXILANT) 60 MG capsule Take 60 mg by mouth daily.    . haloperidol (HALDOL) 5 MG tablet Take 7.5 mg by mouth at bedtime.    . hydrOXYzine (ATARAX/VISTARIL) 50 MG tablet Take 50 mg by mouth 3 (three) times daily as needed for anxiety.    Marland Kitchen levofloxacin (LEVAQUIN) 750 MG tablet Take 1 tablet (750 mg total) by mouth daily at 6 PM. (Patient not taking: Reported on 09/13/2018) 2 tablet 0  . meloxicam (MOBIC) 15 MG tablet Take 15 mg by mouth at bedtime.    Marland Kitchen olopatadine (PATANOL) 0.1 % ophthalmic solution Place 1-2 drops into both eyes 2 (two) times daily.    . pregabalin (LYRICA) 75 MG capsule Take 75 mg by mouth 2 (two) times daily as needed (for nerve pain).    . SUMAtriptan (IMITREX) 50 MG tablet Take 50 mg by mouth See admin instructions. Take 50 mg  by mouth at onset of headache and may repeat once in 2 hours, if no relief    . topiramate (TOPAMAX) 100 MG tablet Take 2 tablets (200 mg total) by mouth 2 (two) times daily. 120 tablet 3  . traZODone (DESYREL) 100 MG tablet Take 300 mg by mouth at bedtime.    . Vitamin D, Ergocalciferol, (DRISDOL) 1.25 MG (50000 UT) CAPS capsule Take 50,000 Units by mouth every 7 (seven) days.     Facility-Administered Medications Prior to Visit  Medication Dose Route Frequency Provider Last Rate Last Dose  . lidocaine (PF) (XYLOCAINE) 1 % injection 0.3 mL  0.3 mL Other Once Tyrell Antonio, MD         Review of Systems  Constitutional: Positive for fatigue. Negative for diaphoresis and fever.  HENT: Negative for congestion, sinus pressure, sinus pain and sore throat.   Respiratory: Positive for cough,  chest tightness and shortness of breath. Negative for wheezing.   Cardiovascular: Positive for chest pain and palpitations. Negative for leg swelling.  Gastrointestinal: Negative for abdominal pain.       Occ GERD  Genitourinary: Negative for dysuria and urgency.  Musculoskeletal: Positive for back pain and myalgias. Negative for joint swelling.  Neurological: Negative for dizziness, seizures, syncope and headaches.  Hematological: Does not bruise/bleed easily.  Psychiatric/Behavioral: Positive for sleep disturbance. Negative for self-injury and suicidal ideas. The patient is not nervous/anxious.        Objective:   Physical Exam Vitals:   09/13/18 1340  BP: 102/71  Pulse: 67  Temp: 97.7 F (36.5 C)  SpO2: 97%  Weight: 221 lb (100.2 kg)  Height: 5\' 8"  (1.727 m)    Gen: Pleasant, well-nourished, in no distress,  normal affect  ENT: No lesions,  mouth clear,  oropharynx clear, no postnasal drip  Neck: No JVD, no TMG, no carotid bruits  Lungs: No use of accessory muscles, no dullness to percussion, clear without rales or rhonchi significant tenderness in the anterior chest wall on both parasternal areas at the location of the CPR  Cardiovascular: RRR, heart sounds normal, no murmur or gallops, no peripheral edema  Abdomen: soft and NT, no HSM,  BS normal  Musculoskeletal: No deformities, no cyanosis or clubbing  Neuro: alert, non focal  Skin: Warm, no lesions or rashes  No results found.  Echo 08/27/18: Study Conclusions  - Left ventricle: The cavity size was normal. Wall thickness was   increased in a pattern of mild LVH. Systolic function was   vigorous. The estimated ejection fraction was in the range of 65%   to 70%. Wall motion was normal; there were no regional wall   motion abnormalities. Doppler parameters are consistent with   abnormal left ventricular relaxation (grade 1 diastolic   dysfunction). - Aortic valve: Trileaflet; mildly thickened, mildly  calcified   leaflets. - Mitral valve: Mildly calcified annulus. - Pulmonary arteries: Systolic pressure was mildly increased. PA   peak pressure: 36 mm Hg (S).  ------------------------------------------------------------------- Cardiac Cath 1/7: SUMMARY  Minimal single vessel CAD in prox RCA with evidence of global arterial spasm  High normal LVEDP   1/2 CT Chest: IMPRESSION: 1. No evidence of a pulmonary embolism. 2. Bilateral areas of peribronchovascular ground-glass opacity in the upper lobes and lower lobes, right greater than left. A component of the lower lobe opacity is likely atelectasis. Findings are consistent with multifocal pneumonia. Asymmetric pulmonary edema is felt less likely but not excluded.  Urine strep pneumo and legionella NEG BC  x 2 NEG HIV neg Hepatitis panel NEG     Assessment & Plan:  I personally reviewed all images and lab data in [facility] system as well as any outside material available during this office visit and agree with the  radiology impressions.   HTN (hypertension) Ongoing hypertension that has been poorly controlled previously now under improved control with compliance to medications  Continue current medication profile  Cardiac arrest (HCC) History of cardiac arrest with PEA and coronary vasospasm with cocaine use  Patient improved at this time  Patient will continue Imdur for now and has been encouraged to keep upcoming cardiology appointment  Non-ST elevation (NSTEMI) myocardial infarction (HCC) Non-STEMI in a patient with coronary vasospasm and cocaine use  Continue Imdur  Acute respiratory failure with hypoxia (HCC) History of respiratory arrest with cardiac arrest now resolved  Community acquired pneumonia Community-acquired pneumonia likely on presentation now resolved  GERD (gastroesophageal reflux disease) Ongoing gastroesophageal reflux disease  We will continue proton pump inhibitor  Alcohol use  disorder, severe, dependence History of severe alcohol use in the past apparently now is abstinent  Cocaine use disorder, severe, dependence Longstanding history of cocaine use with severe dependence  Bipolar disorder (HCC) Bipolar disorder currently stable on current medication profile  Contusion of chest Contusion of chest wall slowly improving status post CPR  Elevated LFTs Elevated liver function tests status post cardiac arrest  We will follow-up liver function profile   Diagnoses and all orders for this visit:  PEA (Pulseless electrical activity) (HCC) -     Comprehensive metabolic panel  Essential hypertension -     Comprehensive metabolic panel  Community acquired pneumonia, unspecified laterality -     CBC with Differential/Platelet; Future -     CBC with Differential/Platelet  Elevated LFTs -     Comprehensive metabolic panel  Cardiac arrest (HCC)  Acute respiratory failure with hypoxia (HCC)  Non-ST elevation (NSTEMI) myocardial infarction (HCC)  Moderate persistent asthma without complication  Gastroesophageal reflux disease without esophagitis  Alcohol use disorder, severe, dependence (HCC)  Cocaine use disorder, severe, dependence (HCC)  Bipolar disorder, current episode mixed, moderate (HCC)  Contusion of chest wall, unspecified laterality, subsequent encounter  Other orders -     levofloxacin (LEVAQUIN) 750 MG tablet; Take 1 tablet (750 mg total) by mouth daily for 2 days.

## 2018-09-13 NOTE — Assessment & Plan Note (Signed)
Longstanding history of cocaine use with severe dependence

## 2018-09-13 NOTE — Assessment & Plan Note (Signed)
Elevated liver function tests status post cardiac arrest  We will follow-up liver function profile

## 2018-09-13 NOTE — Assessment & Plan Note (Signed)
Contusion of chest wall slowly improving status post CPR

## 2018-09-13 NOTE — Assessment & Plan Note (Signed)
History of respiratory arrest with cardiac arrest now resolved

## 2018-09-14 ENCOUNTER — Telehealth: Payer: Self-pay | Admitting: *Deleted

## 2018-09-14 LAB — CBC WITH DIFFERENTIAL/PLATELET
Basophils Absolute: 0.1 10*3/uL (ref 0.0–0.2)
Basos: 1 %
EOS (ABSOLUTE): 0.2 10*3/uL (ref 0.0–0.4)
Eos: 3 %
Hematocrit: 32.9 % — ABNORMAL LOW (ref 34.0–46.6)
Hemoglobin: 11.5 g/dL (ref 11.1–15.9)
Immature Grans (Abs): 0 10*3/uL (ref 0.0–0.1)
Immature Granulocytes: 0 %
Lymphocytes Absolute: 2.2 10*3/uL (ref 0.7–3.1)
Lymphs: 33 %
MCH: 34.2 pg — ABNORMAL HIGH (ref 26.6–33.0)
MCHC: 35 g/dL (ref 31.5–35.7)
MCV: 98 fL — ABNORMAL HIGH (ref 79–97)
Monocytes Absolute: 0.6 10*3/uL (ref 0.1–0.9)
Monocytes: 9 %
Neutrophils Absolute: 3.6 10*3/uL (ref 1.4–7.0)
Neutrophils: 54 %
Platelets: 244 10*3/uL (ref 150–450)
RBC: 3.36 x10E6/uL — ABNORMAL LOW (ref 3.77–5.28)
RDW: 12.8 % (ref 11.7–15.4)
WBC: 6.7 10*3/uL (ref 3.4–10.8)

## 2018-09-14 LAB — COMPREHENSIVE METABOLIC PANEL
ALT: 17 IU/L (ref 0–32)
AST: 13 IU/L (ref 0–40)
Albumin/Globulin Ratio: 1.6 (ref 1.2–2.2)
Albumin: 4.1 g/dL (ref 3.8–4.9)
Alkaline Phosphatase: 138 IU/L — ABNORMAL HIGH (ref 39–117)
BUN/Creatinine Ratio: 7 — ABNORMAL LOW (ref 9–23)
BUN: 8 mg/dL (ref 6–24)
Bilirubin Total: <0.2 mg/dL (ref 0.0–1.2)
CO2: 19 mmol/L — ABNORMAL LOW (ref 20–29)
Calcium: 9.3 mg/dL (ref 8.7–10.2)
Chloride: 107 mmol/L — ABNORMAL HIGH (ref 96–106)
Creatinine, Ser: 1.12 mg/dL — ABNORMAL HIGH (ref 0.57–1.00)
GFR calc Af Amer: 65 mL/min/{1.73_m2} (ref >59)
GFR calc non Af Amer: 56 mL/min/{1.73_m2} — ABNORMAL LOW (ref >59)
Globulin, Total: 2.6 g/dL (ref 1.5–4.5)
Glucose: 90 mg/dL (ref 65–99)
Potassium: 4.4 mmol/L (ref 3.5–5.2)
Sodium: 140 mmol/L (ref 134–144)
Total Protein: 6.7 g/dL (ref 6.0–8.5)

## 2018-09-14 NOTE — Telephone Encounter (Signed)
-----   Message from Storm Frisk, MD sent at 09/14/2018  8:16 AM EST ----- [place], let the patient know all labs improved.  Her kidney function is better, her blood counts are improved, stay on the folic acid,  Liver function is normalized

## 2018-09-14 NOTE — Telephone Encounter (Signed)
Patient verified DOB Patient is aware of labs improving and to stay on the folic acid. No further questions.

## 2018-09-16 MED FILL — hydrOXYzine HCL 50 MG TABS: 50 | 30 days supply | Qty: 90 | Fill #0

## 2018-09-16 MED FILL — traZODone HCL 100 MG TABS: 100 | 30 days supply | Qty: 90 | Fill #0

## 2018-09-16 MED FILL — GABAPENTIN 800 MG TABLET: 800 | 30 days supply | Qty: 120 | Fill #2

## 2018-09-16 MED FILL — DIVALPROEX SOD DR 500 MG TA: 500 | 30 days supply | Qty: 90 | Fill #1

## 2018-09-16 MED FILL — HALOPERIDOL 5 MG TABS: 5 | 30 days supply | Qty: 45 | Fill #0

## 2018-09-21 ENCOUNTER — Encounter: Payer: Self-pay | Admitting: Physician Assistant

## 2018-09-21 ENCOUNTER — Ambulatory Visit (INDEPENDENT_AMBULATORY_CARE_PROVIDER_SITE_OTHER): Payer: Self-pay | Admitting: Physician Assistant

## 2018-09-21 VITALS — BP 121/84 | HR 76 | Ht 67.0 in | Wt 217.2 lb

## 2018-09-21 DIAGNOSIS — F319 Bipolar disorder, unspecified: Secondary | ICD-10-CM

## 2018-09-21 DIAGNOSIS — F1410 Cocaine abuse, uncomplicated: Secondary | ICD-10-CM

## 2018-09-21 DIAGNOSIS — E7800 Pure hypercholesterolemia, unspecified: Secondary | ICD-10-CM

## 2018-09-21 DIAGNOSIS — I10 Essential (primary) hypertension: Secondary | ICD-10-CM

## 2018-09-21 DIAGNOSIS — I469 Cardiac arrest, cause unspecified: Secondary | ICD-10-CM

## 2018-09-21 MED ORDER — ATORVASTATIN CALCIUM 40 MG PO TABS: 40.0000 mg | ORAL_TABLET | Freq: Every day | ORAL | 6 refills | Status: DC

## 2018-09-21 MED FILL — ATORVASTATIN CALCIUM 40 MG: 40 | 30 days supply | Qty: 30 | Fill #0

## 2018-09-21 NOTE — Patient Instructions (Addendum)
Medication Instructions:  INCREASE Lipitor to 40mg  Take 1 tablet once a day If you need a refill on your cardiac medications before your next appointment, please call your pharmacy.   Lab work: Your physician recommends that you return for lab work in: 2 months FASTING LIPID, LFT (11/18/2018) If you have labs (blood work) drawn today and your tests are completely normal, you will receive your results only by: Marland Kitchen MyChart Message (if you have MyChart) OR . A paper copy in the mail If you have any lab test that is abnormal or we need to change your treatment, we will call you to review the results.  Testing/Procedures: NONE   Follow-Up: At [facility], you and your health needs are our priority.  As part of our continuing mission to provide you with exceptional heart care, we have created designated Provider Care Teams.  These Care Teams include your primary Cardiologist (physician) and Advanced Practice Providers (APPs -  Physician Assistants and Nurse Practitioners) who all work together to provide you with the care you need, when you need it. You will need a follow up appointment in 3 months. You may see Dr Nicki Guadalajara or one of the following Advanced Practice Providers on your designated Care Team: [place], [place] . Micah Flesher, PA-C  Any Other Special Instructions Will Be Listed Below (If Applicable).

## 2018-09-21 NOTE — Progress Notes (Signed)
Cardiology Office Note    Date:  09/21/2018   ID:  Amanda [name], DOB Apr 29, 1966, MRN [id number]  PCP:  Hoy Register, MD  Cardiologist:  Dr. Tresa Endo   Chief Complaint  Patient presents with  . Hospitalization Follow-up    seen for Dr. Tresa Endo.     History of Present Illness:  Amanda [name] is a 53 y.o. female with past medical history of asthma, bipolar disorder, HTN, HLD and reported heart murmur who recently was admitted to the hospital on 08/25/2018 with PEA arrest.  She called EMS for chest pressure and increased dyspnea.  EMS reported she became unresponsive and required 5 minutes of CPR for PEA arrest and 1 dose of epinephrine.  By the time she reached the emergency room, she was alert and awake and was breathing on her own.  EKG showed sinus rhythm with nonspecific ST changes.  Urine drug test was positive for cocaine.  CT angiogram of the chest showed no evidence of PE, bilateral area of peri-bronchovascular groundglass opacity in the upper lobes and lower lobes, right greater than left consistent with multifocal pneumonia.  This was felt to be the likely trigger for her acute respiratory failure in combination with cocaine abuse.  Point-of-care troponin was elevated on arrival.  Echocardiogram obtained on 08/27/2018 showed EF 65 to 70%, grade 1 DD, mild LVH, PA peak pressure 36 mmHg.  She eventually underwent cardiac catheterization on 08/30/2018 which showed 30% proximal RCA lesion, normal LVEDP.  No culprit lesion was identified, Imdur 30 mg and amlodipine were added to her medical regimen.  She was advised repeatedly not to use cocaine anymore.  Patient presents today for cardiology office visit.  She continued to have diffuse substernal chest soreness after the recent CPR.  This is exacerbated by deep inspiration, body rotation or palpation.  I explained to the patient the recent cath report and echocardiogram.  I think she is quite stable from cardiology perspective.  I did not  appreciate significant wheezing, rhonchi or crackles on physical exam.  She has no lower extremity edema to suggest volume overload.  Her heart rate is quite regular today.  Her blood pressure is also very well controlled.  We reemphasized the importance of cocaine cessation.  She is aware and determined to quit up at this point.  I did increase her Lipitor to 40 mg daily.  She will need a fasting lipid panel and LFT in 2 months.  She can follow-up with Dr. Tresa Endo in 3 months.   Past Medical History:  Diagnosis Date  . Anxiety   . Arthritis    lower back  . Asthma   . Bipolar disorder (HCC)   . Carpal tunnel syndrome, bilateral   . Chronic back pain   . Depression   . Diverticulosis   . Fx. left wrist   . GERD (gastroesophageal reflux disease)   . Heart murmur    "born with"  . Heart murmur   . HLD (hyperlipidemia)   . Hypertension   . IBS (irritable bowel syndrome)   . Internal hemorrhoids   . Muscle spasms of neck   . Neuropathy   . PUD (peptic ulcer disease)     Past Surgical History:  Procedure Laterality Date  . ARTHRODESIS METATARSALPHALANGEAL JOINT (MTPJ) Right 04/29/2016   Procedure: RIGHT GREAT TOE METATARSOPHALANGEAL JOINT (MTPJ) FUSION, HARDWARE REMOVAL;  Surgeon: Tarry Kos, MD;  Location: [facility];  Service: Orthopedics;  Laterality: Right;  RIGHT GREAT TOE  METATARSOPHALANGEAL JOINT (MTPJ) FUSION, HARDWARE REMOVAL  . CARPAL TUNNEL RELEASE Right 2003  . CHOLECYSTECTOMY N/A 02/09/2014   Procedure: LAPAROSCOPIC CHOLECYSTECTOMY WITH INTRAOPERATIVE CHOLANGIOGRAM;  Surgeon: Valarie Merino, MD;  Location: [facility];  Service: General;  Laterality: N/A;  . CHOLECYSTECTOMY    . FOOT SURGERY    . HAMMER TOE FUSION Bilateral 2008  . HARDWARE REMOVAL Right 04/29/2016   Procedure: HARDWARE REMOVAL;  Surgeon: Tarry Kos, MD;  Location: [facility];  Service: Orthopedics;  Laterality: Right;  HARDWARE REMOVAL  . LEFT HEART CATH AND CORONARY  ANGIOGRAPHY N/A 08/30/2018   Procedure: LEFT HEART CATH AND CORONARY ANGIOGRAPHY;  Surgeon: Marykay Lex, MD;  Location: [facility] IN[facility];  Service: Cardiovascular;  Laterality: N/A;  . ROTATOR CUFF REPAIR Right 2011  . WRIST FRACTURE SURGERY Left     Current Medications: Outpatient Medications Prior to Visit  Medication Sig Dispense Refill  . amLODipine (NORVASC) 5 MG tablet Take 1 tablet (5 mg total) by mouth daily. 30 tablet 3  . buPROPion (WELLBUTRIN SR) 150 MG 12 hr tablet Take 150 mg by mouth 2 (two) times daily.    . cetirizine (ZYRTEC) 10 MG tablet Take 1 tablet (10 mg total) by mouth daily. 30 tablet 5  . cloNIDine (CATAPRES) 0.1 MG tablet Take 1 tablet (0.1 mg total) by mouth at bedtime as needed. For hot flashes 30 tablet 3  . colestipol (COLESTID) 1 g tablet Take 1 tablet (1 g total) by mouth every morning. 30 tablet 4  . cyclobenzaprine (FLEXERIL) 10 MG tablet Take 1 tablet (10 mg total) by mouth 2 (two) times daily. prn 60 tablet 3  . dexlansoprazole (DEXILANT) 60 MG capsule Take 1 capsule (60 mg total) by mouth daily. 30 capsule 2  . divalproex (DEPAKOTE) 500 MG DR tablet Take 500 mg by mouth 3 (three) times daily.    . ergocalciferol (DRISDOL) 1.25 MG (50000 UT) capsule Take 1 capsule (50,000 Units total) by mouth once a week. 9 capsule 1  . fluticasone (FLONASE) 50 MCG/ACT nasal spray PLACE 2 SPRAYS INTO BOTH NOSTRILS DAILY. 16 g 1  . gabapentin (NEURONTIN) 800 MG tablet Take 1 tablet by mouth 4 (four) times daily.    . haloperidol (HALDOL) 5 MG tablet Take 1 tablet by mouth daily.  0  . hydrochlorothiazide (HYDRODIURIL) 25 MG tablet Take 1 tablet (25 mg total) by mouth daily. 30 tablet 3  . hydrOXYzine (ATARAX/VISTARIL) 50 MG tablet Take 1 tablet (50 mg total) by mouth 3 (three) times daily as needed for anxiety or itching. 90 tablet 2  . isosorbide mononitrate (IMDUR) 30 MG 24 hr tablet Take 1 tablet (30 mg total) by mouth daily. 30 tablet 0  . meloxicam (MOBIC) 15 MG  tablet Take 1 tablet (15 mg total) by mouth daily. 30 tablet 3  . metoprolol tartrate (LOPRESSOR) 100 MG tablet Take 1 tablet (100 mg total) by mouth 2 (two) times daily. 60 tablet 3  . naproxen (NAPROSYN) 500 MG tablet Take 1 tablet (500 mg total) by mouth 2 (two) times daily with a meal. 30 tablet 3  . olopatadine (PATANOL) 0.1 % ophthalmic solution PLACE 1 DROP INTO BOTH EYES 2 TIMES DAILY. 5 mL 0  . pregabalin (LYRICA) 75 MG capsule Take 1 capsule (75 mg total) by mouth 2 (two) times daily. 180 capsule 1  . SUMAtriptan (IMITREX) 50 MG tablet TAKE 50 MG ORALLY AT THE ONSET OF A MIGRAINE. MAY REPEAT IN 2 HOURS IF  HEADACHE PERSISTS OR RECURS. MAXIMUM DAILY DOSE 200 MG 10 tablet 3  . topiramate (TOPAMAX) 100 MG tablet Take 200 mg by mouth 2 (two) times daily.    . traZODone (DESYREL) 300 MG tablet Take 1 tablet (300 mg total) by mouth at bedtime. 30 tablet 0  . VENTOLIN HFA 108 (90 Base) MCG/ACT inhaler INHALE 2 PUFFS INTO THE LUNGS EVERY 6 HOURS AS NEEDED FOR WHEEZING OR SHORTNESS OF BREATH. 18 g 0  . atorvastatin (LIPITOR) 20 MG tablet Take 1 tablet (20 mg total) by mouth daily. 30 tablet 5  . aspirin EC 81 MG tablet Take 1 tablet (81 mg total) by mouth daily. (Patient not taking: Reported on 09/21/2018) 150 tablet 0   Facility-Administered Medications Prior to Visit  Medication Dose Route Frequency Provider Last Rate Last Dose  . lidocaine (PF) (XYLOCAINE) 1 % injection 0.3 mL  0.3 mL Other Once Tyrell AntonioNewton, Frederic, MD         Allergies:   Penicillins; Penicillins; Asa [aspirin]; and Aspirin   Social History   Socioeconomic History  . Marital status: Single    Spouse name: Not on file  . Number of children: 0  . Years of education: 3112  . Highest education level: Not on file  Occupational History  . Occupation: [employer]    Comment: [employer]  Social Needs  . Financial resource strain: Not on file  . Food insecurity:    Worry: Not on file    Inability: Not on file  . Transportation needs:     Medical: Not on file    Non-medical: Not on file  Tobacco Use  . Smoking status: Current Some Day Smoker    Types: Cigarettes  . Smokeless tobacco: Never Used  . Tobacco comment: "takes a puff sometimes"  Substance and Sexual Activity  . Alcohol use: Not Currently    Comment: Last drink 6 months ago.  . Drug use: Yes    Types: Cocaine, "Crack" cocaine    Comment: last used two years ago  . Sexual activity: Yes    Partners: Female  Lifestyle  . Physical activity:    Days per week: Not on file    Minutes per session: Not on file  . Stress: Not on file  Relationships  . Social connections:    Talks on phone: Not on file    Gets together: Not on file    Attends religious service: Not on file    Active member of club or organization: Not on file    Attends meetings of clubs or organizations: Not on file    Relationship status: Not on file  Other Topics Concern  . Not on file  Social History Narrative   ** Merged History Encounter **       Pt lives at home alone. She has a HS education level and does not have any children.  She drinks 2 cups of caffeine daily.     Family History:  The patient's family history includes Breast cancer in her paternal grandmother; CAD in her father; Diabetes in her father; Diabetes Mellitus II in her father; HIV/AIDS in her brother; Heart failure in her father; Other in her sister; Pancreatic cancer in her mother.   ROS:   Please see the history of present illness.    ROS All other systems reviewed and are negative.   PHYSICAL EXAM:   VS:  BP 121/84   Pulse 76   Ht 5\' 7"  (1.702 m)   Wt 217  lb 3.2 oz (98.5 kg)   SpO2 100%   BMI 34.02 kg/m    GEN: Well nourished, well developed, in no acute distress  HEENT: normal  Neck: no JVD, carotid bruits, or masses Cardiac: RRR; no murmurs, rubs, or gallops,no edema  Respiratory:  clear to auscultation bilaterally, normal work of breathing GI: soft, nontender, nondistended, + BS MS: no deformity  or atrophy  Skin: warm and dry, no rash Neuro:  Alert and Oriented x 3, Strength and sensation are intact Psych: euthymic mood, full affect  Wt Readings from Last 3 Encounters:  09/21/18 217 lb 3.2 oz (98.5 kg)  09/13/18 221 lb (100.2 kg)  08/30/18 223 lb 8 oz (101.4 kg)      Studies/Labs Reviewed:   EKG:  EKG is not ordered today.   Recent Labs: 08/26/2018: B Natriuretic Peptide 527.1; TSH 0.483 08/28/2018: Magnesium 2.0 09/13/2018: ALT 17; BUN 8; Creatinine, Ser 1.12; Hemoglobin 11.5; Platelets 244; Potassium 4.4; Sodium 140   Lipid Panel    Component Value Date/Time   CHOL 205 (H) 09/30/2017 1104   TRIG 161 (H) 09/30/2017 1104   HDL 52 09/30/2017 1104   CHOLHDL 3.9 09/30/2017 1104   CHOLHDL 2.9 11/27/2015 1038   VLDL 9 11/27/2015 1038   LDLCALC 121 (H) 09/30/2017 1104    Additional studies/ records that were reviewed today include:   CTA of chest 08/25/2018 IMPRESSION: 1. No evidence of a pulmonary embolism. 2. Bilateral areas of peribronchovascular ground-glass opacity in the upper lobes and lower lobes, right greater than left. A component of the lower lobe opacity is likely atelectasis. Findings are consistent with multifocal pneumonia. Asymmetric pulmonary edema is felt less likely but not excluded.   Echo 08/27/2018 LV EF: 65% -   70% Study Conclusions  - Left ventricle: The cavity size was normal. Wall thickness was   increased in a pattern of mild LVH. Systolic function was   vigorous. The estimated ejection fraction was in the range of 65%   to 70%. Wall motion was normal; there were no regional wall   motion abnormalities. Doppler parameters are consistent with   abnormal left ventricular relaxation (grade 1 diastolic   dysfunction). - Aortic valve: Trileaflet; mildly thickened, mildly calcified   leaflets. - Mitral valve: Mildly calcified annulus. - Pulmonary arteries: Systolic pressure was mildly increased. PA   peak pressure: 36 mm Hg (S).   Cath  08/30/2018  LV end diastolic pressure is normal.  Prox RCA lesion is 30% stenosed.   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  Defer further management to primary team.   Diagnostic  Dominance: Right      ASSESSMENT:    1. PEA (Pulseless electrical activity) (HCC)   2. Pure hypercholesterolemia   3. Essential hypertension   4. Cocaine abuse (HCC)   5. Bipolar affective disorder, remission status unspecified (HCC)      PLAN:  In order of problems listed above:  1. PEA arrest: Occurred in the setting of multifocal pneumonia and cocaine use.  Patient is recovering quite well.  Unlikely to be cardiogenic in nature given reassuring cardiac catheterization and echocardiogram.  She is currently on both amlodipine and Imdur for vasodilatory effect to prevent any vasoconstriction.  2. Cocaine abuse: She  is aware of significant vasoconstriction that it can occur in the setting of cocaine use.  She is determined to quit.  Continue on amlodipine and Imdur.  3. Hyperlipidemia: Previous lab work in early 2019 showed uncontrolled cholesterol.  I will increase her Lipitor to 40 mg daily.  She will need a fasting lipid panel and LFT in 2 months  4. Hypertension: Blood pressure stable on current medication  5. Bipolar disorder: We will defer management to primary care provider.    Medication Adjustments/Labs and Tests Ordered: Current medicines are reviewed at length with the patient today.  Concerns regarding medicines are outlined above.  Medication changes, Labs and Tests ordered today are listed in the Patient Instructions below. Patient Instructions  Medication Instructions:  INCREASE Lipitor to 40mg  Take 1 tablet once a day If you need a refill on  your cardiac medications before your next appointment, please call your pharmacy.   Lab work: Your physician recommends that you return for lab work in: 2 months FASTING LIPID, LFT (11/18/2018) If you have labs (blood work) drawn today and your tests are completely normal, you will receive your results only by: Marland Kitchen MyChart Message (if you have MyChart) OR . A paper copy in the mail If you have any lab test that is abnormal or we need to change your treatment, we will call you to review the results.  Testing/Procedures: NONE   Follow-Up: At [facility], you and your health needs are our priority.  As part of our continuing mission to provide you with exceptional heart care, we have created designated Provider Care Teams.  These Care Teams include your primary Cardiologist (physician) and Advanced Practice Providers (APPs -  Physician Assistants and Nurse Practitioners) who all work together to provide you with the care you need, when you need it. You will need a follow up appointment in 3 months. You may see Dr Nicki Guadalajara or one of the following Advanced Practice Providers on your designated Care Team: [place], [place] . Micah Flesher, PA-C  Any Other Special Instructions Will Be Listed Below (If Applicable).      Ramond Dial, [place]  09/21/2018 2:49 PM    [facility] [address] [place], [place], [place]  [address] Phone: [phone]; Fax: [phone]

## 2018-10-04 ENCOUNTER — Telehealth: Payer: Self-pay | Admitting: Cardiovascular Disease

## 2018-10-04 MED ORDER — ISOSORBIDE MONONITRATE ER 30 MG PO TB24: 30.0000 mg | ORAL_TABLET | Freq: Every day | ORAL | 3 refills | Status: DC

## 2018-10-04 NOTE — Telephone Encounter (Signed)
RX sent in for Imdur.  

## 2018-10-04 NOTE — Telephone Encounter (Signed)
Pt calling requesting a refill on isosorbide mononitrate 30 mg 24 hr tablet. Please address this is Dr. Landry Dyke pt.

## 2018-10-05 ENCOUNTER — Other Ambulatory Visit: Payer: Self-pay | Admitting: Family Medicine

## 2018-10-05 DIAGNOSIS — Z1231 Encounter for screening mammogram for malignant neoplasm of breast: Secondary | ICD-10-CM

## 2018-10-06 ENCOUNTER — Ambulatory Visit: Payer: Self-pay | Admitting: Family Medicine

## 2018-10-17 ENCOUNTER — Other Ambulatory Visit: Payer: Self-pay | Admitting: Pharmacist

## 2018-10-17 ENCOUNTER — Other Ambulatory Visit: Payer: Self-pay | Admitting: Family Medicine

## 2018-10-17 DIAGNOSIS — J302 Other seasonal allergic rhinitis: Secondary | ICD-10-CM

## 2018-10-17 DIAGNOSIS — M5416 Radiculopathy, lumbar region: Secondary | ICD-10-CM

## 2018-10-17 DIAGNOSIS — G43709 Chronic migraine without aura, not intractable, without status migrainosus: Secondary | ICD-10-CM

## 2018-10-17 DIAGNOSIS — K219 Gastro-esophageal reflux disease without esophagitis: Secondary | ICD-10-CM

## 2018-10-17 MED ORDER — CYCLOBENZAPRINE HCL 10 MG PO TABS: 10.0000 mg | ORAL_TABLET | Freq: Two times a day (BID) | ORAL | 2 refills | Status: DC

## 2018-10-17 MED FILL — HALOPERIDOL 5 MG TABS: 5 | 30 days supply | Qty: 45 | Fill #0

## 2018-10-17 MED FILL — cloNIDine HCL 0.1 MG TABS: 0.1 | 30 days supply | Qty: 30 | Fill #2

## 2018-10-17 MED FILL — VIT D2 1.25 MG (50,000 UNIT: 1.25 MG | 28 days supply | Qty: 4 | Fill #2

## 2018-10-17 MED FILL — BUPROPION SR 150 MG TABLET: 150 | 30 days supply | Qty: 60 | Fill #0

## 2018-10-17 MED FILL — HYDROCHLOROTHIAZIDE 25 MG T: 25 | 30 days supply | Qty: 30 | Fill #1

## 2018-10-17 MED FILL — hydrOXYzine HCL 50 MG TABS: 50 | 30 days supply | Qty: 90 | Fill #0

## 2018-10-17 MED FILL — MELOXICAM 15 MG TABLET: 15 | 30 days supply | Qty: 30 | Fill #2

## 2018-10-17 MED FILL — ISOSORBIDE MN ER 30 MG TAB: 30 | 90 days supply | Qty: 90 | Fill #0

## 2018-10-17 MED FILL — AMLODIPINE BESYLATE 5 MG TA: 5 | 30 days supply | Qty: 30 | Fill #2

## 2018-10-17 MED FILL — DIVALPROEX SOD DR 500 MG TA: 500 | 30 days supply | Qty: 90 | Fill #0

## 2018-10-17 MED FILL — TOPIRAMATE 100 MG TABS: 100 | 30 days supply | Qty: 120 | Fill #1

## 2018-10-17 MED FILL — GABAPENTIN 800 MG TABLET: 800 | 30 days supply | Qty: 120 | Fill #0

## 2018-10-17 MED FILL — traZODone HCL 100 MG TABS: 100 | 30 days supply | Qty: 90 | Fill #0

## 2018-10-17 MED FILL — ATORVASTATIN CALCIUM 40 MG: 40 | 30 days supply | Qty: 30 | Fill #0

## 2018-10-17 MED FILL — METOPROLOL TARTRATE 100 MG: 100 | 30 days supply | Qty: 60 | Fill #1

## 2018-10-18 MED FILL — CYCLOBENZAPRINE 10 MG TAB: 10 | 30 days supply | Qty: 60 | Fill #0

## 2018-10-18 MED FILL — OLOPATADINE HCL 0.1% EYE DR: 0.1 | 18 days supply | Qty: 5 | Fill #0

## 2018-10-18 MED FILL — !DEXILANT DR 60 MG CAPSULE: 60 | 30 days supply | Qty: 30 | Fill #0

## 2018-10-20 ENCOUNTER — Telehealth: Payer: Self-pay

## 2018-10-20 DIAGNOSIS — M5416 Radiculopathy, lumbar region: Secondary | ICD-10-CM

## 2018-10-20 MED ORDER — PREGABALIN 75 MG PO CAPS: 75.0000 mg | ORAL_CAPSULE | Freq: Two times a day (BID) | ORAL | 3 refills | Status: DC

## 2018-10-20 NOTE — Telephone Encounter (Signed)
Refilled

## 2018-10-20 NOTE — Telephone Encounter (Signed)
Tresa Endo needs hard copy of lyrica for PASS. 75mg ,bid,qty-180

## 2018-10-25 ENCOUNTER — Ambulatory Visit: Payer: Self-pay | Attending: Family Medicine | Admitting: Family Medicine

## 2018-10-25 ENCOUNTER — Encounter: Payer: Self-pay | Admitting: Family Medicine

## 2018-10-25 VITALS — BP 113/80 | HR 72 | Temp 97.7°F | Ht 67.0 in | Wt 223.8 lb

## 2018-10-25 DIAGNOSIS — I469 Cardiac arrest, cause unspecified: Secondary | ICD-10-CM

## 2018-10-25 DIAGNOSIS — R232 Flushing: Secondary | ICD-10-CM

## 2018-10-25 DIAGNOSIS — I10 Essential (primary) hypertension: Secondary | ICD-10-CM

## 2018-10-25 DIAGNOSIS — F319 Bipolar disorder, unspecified: Secondary | ICD-10-CM

## 2018-10-25 DIAGNOSIS — M5416 Radiculopathy, lumbar region: Secondary | ICD-10-CM

## 2018-10-25 DIAGNOSIS — R0789 Other chest pain: Secondary | ICD-10-CM

## 2018-10-25 MED ORDER — ALBUTEROL SULFATE HFA 108 (90 BASE) MCG/ACT IN AERS: INHALATION_SPRAY | RESPIRATORY_TRACT | 3 refills | Status: DC

## 2018-10-25 MED ORDER — METOPROLOL TARTRATE 100 MG PO TABS: 100.0000 mg | ORAL_TABLET | Freq: Two times a day (BID) | ORAL | 3 refills | Status: DC

## 2018-10-25 MED ORDER — LIDOCAINE 5 % EX PTCH: 1.0000 | MEDICATED_PATCH | CUTANEOUS | 3 refills | Status: DC

## 2018-10-25 MED ORDER — CLONIDINE HCL 0.1 MG PO TABS: 0.1000 mg | ORAL_TABLET | Freq: Every evening | ORAL | 3 refills | Status: DC | PRN

## 2018-10-25 NOTE — Patient Instructions (Signed)
Acute Back Pain, Adult  Acute back pain is sudden and usually short-lived. It is often caused by an injury to the muscles and tissues in the back. The injury may result from:   A muscle or ligament getting overstretched or torn (strained). Ligaments are tissues that connect bones to each other. Lifting something improperly can cause a back strain.   Wear and tear (degeneration) of the spinal disks. Spinal disks are circular tissue that provides cushioning between the bones of the spine (vertebrae).   Twisting motions, such as while playing sports or doing yard work.   A hit to the back.   Arthritis.  You may have a physical exam, lab tests, and imaging tests to find the cause of your pain. Acute back pain usually goes away with rest and home care.  Follow these instructions at home:  Managing pain, stiffness, and swelling   Take over-the-counter and prescription medicines only as told by your health care provider.   Your health care provider may recommend applying ice during the first 24-48 hours after your pain starts. To do this:  ? Put ice in a plastic bag.  ? Place a towel between your skin and the bag.  ? Leave the ice on for 20 minutes, 2-3 times a day.   If directed, apply heat to the affected area as often as told by your health care provider. Use the heat source that your health care provider recommends, such as a moist heat pack or a heating pad.  ? Place a towel between your skin and the heat source.  ? Leave the heat on for 20-30 minutes.  ? Remove the heat if your skin turns bright red. This is especially important if you are unable to feel pain, heat, or cold. You have a greater risk of getting burned.  Activity     Do not stay in bed. Staying in bed for more than 1-2 days can delay your recovery.   Sit up and stand up straight. Avoid leaning forward when you sit, or hunching over when you stand.  ? If you work at a desk, sit close to it so you do not need to lean over. Keep your chin tucked  in. Keep your neck drawn back, and keep your elbows bent at a right angle. Your arms should look like the letter "L."  ? Sit high and close to the steering wheel when you drive. Add lower back (lumbar) support to your car seat, if needed.   Take short walks on even surfaces as soon as you are able. Try to increase the length of time you walk each day.   Do not sit, drive, or stand in one place for more than 30 minutes at a time. Sitting or standing for long periods of time can put stress on your back.   Do not drive or use heavy machinery while taking prescription pain medicine.   Use proper lifting techniques. When you bend and lift, use positions that put less stress on your back:  ? Bend your knees.  ? Keep the load close to your body.  ? Avoid twisting.   Exercise regularly as told by your health care provider. Exercising helps your back heal faster and helps prevent back injuries by keeping muscles strong and flexible.   Work with a physical therapist to make a safe exercise program, as recommended by your health care provider. Do any exercises as told by your physical therapist.  Lifestyle   Maintain   a healthy weight. Extra weight puts stress on your back and makes it difficult to have good posture.   Avoid activities or situations that make you feel anxious or stressed. Stress and anxiety increase muscle tension and can make back pain worse. Learn ways to manage anxiety and stress, such as through exercise.  General instructions   Sleep on a firm mattress in a comfortable position. Try lying on your side with your knees slightly bent. If you lie on your back, put a pillow under your knees.   Follow your treatment plan as told by your health care provider. This may include:  ? Cognitive or behavioral therapy.  ? Acupuncture or massage therapy.  ? Meditation or yoga.  Contact a health care provider if:   You have pain that is not relieved with rest or medicine.   You have increasing pain going down  into your legs or buttocks.   Your pain does not improve after 2 weeks.   You have pain at night.   You lose weight without trying.   You have a fever or chills.  Get help right away if:   You develop new bowel or bladder control problems.   You have unusual weakness or numbness in your arms or legs.   You develop nausea or vomiting.   You develop abdominal pain.   You feel faint.  Summary   Acute back pain is sudden and usually short-lived.   Use proper lifting techniques. When you bend and lift, use positions that put less stress on your back.   Take over-the-counter and prescription medicines and apply heat or ice as directed by your health care provider.  This information is not intended to replace advice given to you by your health care provider. Make sure you discuss any questions you have with your health care provider.  Document Released: 08/10/2005 Document Revised: 03/17/2018 Document Reviewed: 03/24/2017  [employer]vier Interactive Patient Education  2019 [employer]

## 2018-10-25 NOTE — Progress Notes (Signed)
Subjective:  Patient ID: Amanda [name], female    DOB: 08-13-1966  Age: 53 y.o. MRN: [id number]  CC: Hypertension; Headache; and Back Pain   HPI [name] is a 53 year old female with a history of hypertension, bipolar disorder, neuropathy, chronic low back pain, PUD/GERD, Migraine, carpal tunnel syndrome Who comes in today for a follow-up Visit.  She had a hospitalization at [facility] from 08/25/2018 through 08/30/2022 acute respiratory failure with hypoxia secondary to multifocal pneumonia, PEA arrest in the setting of respiratory failure and cocaine abuse. She had called EMS due to chest pressure and dyspnea, she became unresponsive and required CPR and epinephrine for PEA arrest and had recovered by the time she presented to the ED. Troponins were 0.30, 0.24, 0.26, urine drug screen was positive for cocaine, chest x-ray revealed probable CHF, CT head revealed no intracranial abnormality, CT angiogram of the chest was negative for PE but revealed findings consistent with multifocal pneumonia. Echocardiogram revealed EF of 65 to 70%, grade 1 diastolic dysfunction, elevated PA pressure. Cardiac cath performed due to concern for NSTEMI and revealed 30% proximal RCA lesion, normal LVEDP. Seen by cardiology for follow-up on 09/21/2018.  She presents today complaining of residual chest pain around her sternum.  She also has left-sided back pain which radiates intermittently down her left lower extremity and is uncontrolled on meloxicam and Flexeril.  She describes pain as severe and denies recent falls. With regards to cocaine use she has discontinued this she says.  Past Medical History:  Diagnosis Date  . Anxiety   . Arthritis    lower back  . Asthma   . Bipolar disorder (HCC)   . Carpal tunnel syndrome, bilateral   . Chronic back pain   . Depression   . Diverticulosis   . Fx. left wrist   . GERD (gastroesophageal reflux disease)   . Heart murmur    "born with"  .  Heart murmur   . HLD (hyperlipidemia)   . Hypertension   . IBS (irritable bowel syndrome)   . Internal hemorrhoids   . Muscle spasms of neck   . Neuropathy   . PUD (peptic ulcer disease)     Past Surgical History:  Procedure Laterality Date  . ARTHRODESIS METATARSALPHALANGEAL JOINT (MTPJ) Right 04/29/2016   Procedure: RIGHT GREAT TOE METATARSOPHALANGEAL JOINT (MTPJ) FUSION, HARDWARE REMOVAL;  Surgeon: Tarry Kos, MD;  Location: [facility];  Service: Orthopedics;  Laterality: Right;  RIGHT GREAT TOE METATARSOPHALANGEAL JOINT (MTPJ) FUSION, HARDWARE REMOVAL  . CARPAL TUNNEL RELEASE Right 2003  . CHOLECYSTECTOMY N/A 02/09/2014   Procedure: LAPAROSCOPIC CHOLECYSTECTOMY WITH INTRAOPERATIVE CHOLANGIOGRAM;  Surgeon: Valarie Merino, MD;  Location: [facility];  Service: General;  Laterality: N/A;  . CHOLECYSTECTOMY    . FOOT SURGERY    . HAMMER TOE FUSION Bilateral 2008  . HARDWARE REMOVAL Right 04/29/2016   Procedure: HARDWARE REMOVAL;  Surgeon: Tarry Kos, MD;  Location: [facility];  Service: Orthopedics;  Laterality: Right;  HARDWARE REMOVAL  . LEFT HEART CATH AND CORONARY ANGIOGRAPHY N/A 08/30/2018   Procedure: LEFT HEART CATH AND CORONARY ANGIOGRAPHY;  Surgeon: Marykay Lex, MD;  Location: [facility];  Service: Cardiovascular;  Laterality: N/A;  . ROTATOR CUFF REPAIR Right 2011  . WRIST FRACTURE SURGERY Left     Family History  Problem Relation Age of Onset  . Pancreatic cancer Mother   . CAD Father   . Diabetes Mellitus II Father   .  Heart failure Father   . Diabetes Father   . Breast cancer Paternal Grandmother   . HIV/AIDS Brother   . Other Sister        Brain tumor  . Colon cancer Neg Hx     Allergies  Allergen Reactions  . Penicillins Anaphylaxis    Has patient had a PCN reaction causing immediate rash, facial/tongue/throat swelling, SOB or lightheadedness with hypotension: Yes Has patient had a PCN reaction causing severe rash  involving mucus membranes or skin necrosis: Yes Has patient had a PCN reaction that required hospitalization Yes Has patient had a PCN reaction occurring within the last 10 years: No-more than 10 years ago If all of the above answers are "NO", then may proceed with Cephalosporin use.   Amanda [name] Penicillins Anaphylaxis    DID THE REACTION INVOLVE: Swelling of the face/tongue/throat, SOB, or low BP? Yes Sudden or severe rash/hives, skin peeling, or the inside of the mouth or nose? Yes Did it require medical treatment? No When did it last happen? Within the past 10 years If all above answers are "NO", may proceed with cephalosporin use.    Jonne Ply [Aspirin] Rash  . Aspirin Rash    Outpatient Medications Prior to Visit  Medication Sig Dispense Refill  . amLODipine (NORVASC) 5 MG tablet Take 1 tablet (5 mg total) by mouth daily. 30 tablet 3  . aspirin EC 81 MG tablet Take 1 tablet (81 mg total) by mouth daily. 150 tablet 0  . atorvastatin (LIPITOR) 40 MG tablet Take 1 tablet (40 mg total) by mouth daily. 30 tablet 6  . buPROPion (WELLBUTRIN SR) 150 MG 12 hr tablet Take 150 mg by mouth 2 (two) times daily.    . cetirizine (ZYRTEC) 10 MG tablet Take 1 tablet (10 mg total) by mouth daily. 30 tablet 5  . colestipol (COLESTID) 1 g tablet Take 1 tablet (1 g total) by mouth every morning. 30 tablet 4  . cyclobenzaprine (FLEXERIL) 10 MG tablet Take 1 tablet (10 mg total) by mouth 2 (two) times daily. prn 60 tablet 2  . DEXILANT 60 MG capsule TAKE 1 CAPSULE (60 MG TOTAL) BY MOUTH DAILY. 30 capsule 2  . divalproex (DEPAKOTE) 500 MG DR tablet Take 500 mg by mouth 3 (three) times daily.    . ergocalciferol (DRISDOL) 1.25 MG (50000 UT) capsule Take 1 capsule (50,000 Units total) by mouth once a week. 9 capsule 1  . fluticasone (FLONASE) 50 MCG/ACT nasal spray PLACE 2 SPRAYS INTO BOTH NOSTRILS DAILY. 16 g 1  . haloperidol (HALDOL) 5 MG tablet Take 1 tablet by mouth daily.  0  . hydrochlorothiazide (HYDRODIURIL)  25 MG tablet Take 1 tablet (25 mg total) by mouth daily. 30 tablet 3  . hydrOXYzine (ATARAX/VISTARIL) 50 MG tablet Take 1 tablet (50 mg total) by mouth 3 (three) times daily as needed for anxiety or itching. 90 tablet 2  . isosorbide mononitrate (IMDUR) 30 MG 24 hr tablet Take 1 tablet (30 mg total) by mouth daily. 90 tablet 3  . meloxicam (MOBIC) 15 MG tablet Take 1 tablet (15 mg total) by mouth daily. 30 tablet 3  . olopatadine (PATANOL) 0.1 % ophthalmic solution PLACE 1 DROP INTO BOTH EYES 2 TIMES DAILY. 5 mL 2  . pregabalin (LYRICA) 75 MG capsule Take 1 capsule (75 mg total) by mouth 2 (two) times daily. 180 capsule 3  . SUMAtriptan (IMITREX) 50 MG tablet TAKE 50 MG ORALLY AT THE ONSET OF A MIGRAINE. MAY REPEAT  IN 2 HOURS IF HEADACHE PERSISTS OR RECURS. MAXIMUM DAILY DOSE 200 MG 10 tablet 3  . topiramate (TOPAMAX) 100 MG tablet Take 200 mg by mouth 2 (two) times daily.    . traZODone (DESYREL) 300 MG tablet Take 1 tablet (300 mg total) by mouth at bedtime. 30 tablet 0  . cloNIDine (CATAPRES) 0.1 MG tablet Take 1 tablet (0.1 mg total) by mouth at bedtime as needed. For hot flashes 30 tablet 3  . gabapentin (NEURONTIN) 800 MG tablet Take 1 tablet by mouth 4 (four) times daily.    . metoprolol tartrate (LOPRESSOR) 100 MG tablet Take 1 tablet (100 mg total) by mouth 2 (two) times daily. 60 tablet 3  . naproxen (NAPROSYN) 500 MG tablet Take 1 tablet (500 mg total) by mouth 2 (two) times daily with a meal. 30 tablet 3  . VENTOLIN HFA 108 (90 Base) MCG/ACT inhaler INHALE 2 PUFFS INTO THE LUNGS EVERY 6 HOURS AS NEEDED FOR WHEEZING OR SHORTNESS OF BREATH. 18 g 0   Facility-Administered Medications Prior to Visit  Medication Dose Route Frequency Provider Last Rate Last Dose  . lidocaine (PF) (XYLOCAINE) 1 % injection 0.3 mL  0.3 mL Other Once Tyrell Antonio, MD         ROS Review of Systems General: negative for fever, weight loss, appetite change Eyes: no visual symptoms. ENT: no ear symptoms,  no sinus tenderness, no nasal congestion or sore throat. Neck: no pain  Respiratory: no wheezing, shortness of breath, cough Cardiovascular: +chest pain, no dyspnea on exertion, no pedal edema, no orthopnea. Gastrointestinal: no abdominal pain, no diarrhea, no constipation Genito-Urinary: no urinary frequency, no dysuria, no polyuria. Hematologic: no bruising Endocrine: no cold or heat intolerance Neurological: no headaches, no seizures, no tremors Musculoskeletal: no joint pains, no joint swelling, + back pain Skin: no pruritus, no rash. Psychological: no depression, no anxiety,    Objective:  BP 113/80   Pulse 72   Temp 97.7 F (36.5 C) (Oral)   Ht 5\' 7"  (1.702 m)   Wt 223 lb 12.8 oz (101.5 kg)   SpO2 100%   BMI 35.05 kg/m   BP/Weight 10/25/2018 09/21/2018 09/13/2018  Systolic BP 113 121 102  Diastolic BP 80 84 71  Wt. (Lbs) 223.8 217.2 221  BMI 35.05 34.02 33.6  Some encounter information is confidential and restricted. Go to Review Flowsheets activity to see all data.      Physical Exam Constitutional: normal appearing,  Eyes: PERRLA HEENT: Head is atraumatic, normal sinuses, normal oropharynx, normal appearing tonsils and palate, tympanic membrane is normal bilaterally. Neck: normal range of motion, no thyromegaly, no JVD Cardiovascular: normal rate and rhythm, normal heart sounds, no murmurs, rub or gallop, no pedal edema, TTP of sternum Respiratory: Normal breath sounds, clear to auscultation bilaterally, no wheezes, no rales, no rhonchi Abdomen: soft, not tender to palpation, normal bowel sounds, no enlarged organs Musculoskeletal: Full ROM, no tenderness in joints Skin: warm and dry, no lesions. Neurological: alert, oriented x3, cranial nerves I-XII grossly intact , normal motor strength, normal sensation. Psychological: normal mood.   CMP Latest Ref Rng & Units 09/13/2018 08/30/2018 08/30/2018  Glucose 65 - 99 mg/dL 90 - 97  BUN 6 - 24 mg/dL 8 - 16  Creatinine  2.86 - 1.00 mg/dL 3.81(R) 7.11(A) 5.79(U)  Sodium 134 - 144 mmol/L 140 - 137  Potassium 3.5 - 5.2 mmol/L 4.4 - 4.2  Chloride 96 - 106 mmol/L 107(H) - 110  CO2 20 - 29 mmol/L  19(L) - 20(L)  Calcium 8.7 - 10.2 mg/dL 9.3 - 8.7(L)  Total Protein 6.0 - 8.5 g/dL 6.7 - -  Total Bilirubin 0.0 - 1.2 mg/dL <1.0 - -  Alkaline Phos 39 - 117 IU/L 138(H) - -  AST 0 - 40 IU/L 13 - -  ALT 0 - 32 IU/L 17 - -    Lipid Panel     Component Value Date/Time   CHOL 205 (H) 09/30/2017 1104   TRIG 161 (H) 09/30/2017 1104   HDL 52 09/30/2017 1104   CHOLHDL 3.9 09/30/2017 1104   CHOLHDL 2.9 11/27/2015 1038   VLDL 9 11/27/2015 1038   LDLCALC 121 (H) 09/30/2017 1104    CBC    Component Value Date/Time   WBC 6.7 09/13/2018 1528   WBC 6.0 08/30/2018 1053   RBC 3.36 (L) 09/13/2018 1528   RBC 3.30 (L) 08/30/2018 1053   HGB 11.5 09/13/2018 1528   HCT 32.9 (L) 09/13/2018 1528   PLT 244 09/13/2018 1528   MCV 98 (H) 09/13/2018 1528   MCH 34.2 (H) 09/13/2018 1528   MCH 32.7 08/30/2018 1053   MCHC 35.0 09/13/2018 1528   MCHC 31.7 08/30/2018 1053   RDW 12.8 09/13/2018 1528   LYMPHSABS 2.2 09/13/2018 1528   MONOABS 0.7 08/28/2018 0315   EOSABS 0.2 09/13/2018 1528   BASOSABS 0.1 09/13/2018 1528    Lab Results  Component Value Date   HGBA1C 4.3 (L) 08/26/2018    Assessment & Plan:   1. Hot flashes Stable - cloNIDine (CATAPRES) 0.1 MG tablet; Take 1 tablet (0.1 mg total) by mouth at bedtime as needed. For hot flashes  Dispense: 30 tablet; Refill: 3  2. Essential hypertension Controlled Counseled on blood pressure goal of less than 130/80, low-sodium, DASH diet, medication compliance, 150 minutes of moderate intensity exercise per week. Discussed medication compliance, adverse effects. - metoprolol tartrate (LOPRESSOR) 100 MG tablet; Take 1 tablet (100 mg total) by mouth 2 (two) times daily.  Dispense: 60 tablet; Refill: 3  3. Bipolar affective disorder, remission status unspecified  (HCC) Stable Managed by [facility]  4. Lumbar radiculopathy Uncontrolled Placed on Lidoderm patch Continue meloxicam, Flexeril Will benefit from PT however she lacks medical coverage and has been advised to recertify for the [place] financial discount to facilitate referral to [facility]  5. PEA (Pulseless electrical activity) (HCC) Status post CPR Occurred in the setting of cocaine abuse (which she states she has discontinued) and multifocal pneumonia Cardiac cath-nonobstructive CAD Risk factor modification Continue current medications.  6. Musculoskeletal chest pain Secondary to CPR Currently on NSAIDs and muscle relaxant Will resolve over time.   Meds ordered this encounter  Medications  . cloNIDine (CATAPRES) 0.1 MG tablet    Sig: Take 1 tablet (0.1 mg total) by mouth at bedtime as needed. For hot flashes    Dispense:  30 tablet    Refill:  3  . lidocaine (LIDODERM) 5 %    Sig: Place 1 patch onto the skin daily. Remove & Discard patch within 12 hours or as directed by MD    Dispense:  30 patch    Refill:  3  . albuterol (VENTOLIN HFA) 108 (90 Base) MCG/ACT inhaler    Sig: INHALE 2 PUFFS INTO THE LUNGS EVERY 6 HOURS AS NEEDED FOR WHEEZING OR SHORTNESS OF BREATH.    Dispense:  18 g    Refill:  3  . metoprolol tartrate (LOPRESSOR) 100 MG tablet    Sig: Take 1 tablet (  100 mg total) by mouth 2 (two) times daily.    Dispense:  60 tablet    Refill:  3    Follow-up: Return in about 3 months (around 01/25/2019) for Follow-up of chronic medical conditions.       Hoy Register, MD, FAAFP. [facility] [place], [place] [phone]   10/25/2018, 11:42 AM

## 2018-10-26 ENCOUNTER — Ambulatory Visit: Payer: Self-pay | Admitting: Family Medicine

## 2018-11-01 ENCOUNTER — Ambulatory Visit: Payer: Self-pay

## 2018-12-05 ENCOUNTER — Other Ambulatory Visit: Payer: Self-pay | Admitting: Family Medicine

## 2018-12-05 DIAGNOSIS — G43709 Chronic migraine without aura, not intractable, without status migrainosus: Secondary | ICD-10-CM

## 2018-12-12 MED FILL — HALOPERIDOL 5 MG TABS: 5 | 60 days supply | Qty: 90 | Fill #1

## 2018-12-12 MED FILL — DEXILANT DR 60 MG CAPSULE: 60 | 30 days supply | Qty: 30 | Fill #1

## 2018-12-12 MED FILL — traZODone HCL 100 MG TABS: 100 | 60 days supply | Qty: 180 | Fill #1

## 2018-12-12 MED FILL — DIVALPROEX SOD DR 500 MG TA: 500 | 60 days supply | Qty: 180 | Fill #1

## 2018-12-12 MED FILL — cloNIDine HCL 0.1 MG TABS: 0.1 | 90 days supply | Qty: 90 | Fill #0

## 2018-12-12 MED FILL — VENTOLIN HFA 90 MCG INHALER: 108 (90 BAS | 25 days supply | Qty: 18 | Fill #0

## 2018-12-12 MED FILL — TOPIRAMATE 100 MG TABS: 100 | 60 days supply | Qty: 240 | Fill #2

## 2018-12-12 MED FILL — AMLODIPINE BESYLATE 5 MG TA: 5 | 30 days supply | Qty: 30 | Fill #3

## 2018-12-12 MED FILL — hydrOXYzine HCL 50 MG TABS: 50 | 60 days supply | Qty: 180 | Fill #1

## 2018-12-12 MED FILL — GABAPENTIN 800 MG TABLET: 800 | 30 days supply | Qty: 120 | Fill #1

## 2018-12-12 MED FILL — VIT D2 1.25 MG (50,000 UNIT: 1.25 MG | 28 days supply | Qty: 4 | Fill #3

## 2018-12-12 MED FILL — MELOXICAM 15 MG TABLET: 15 | 30 days supply | Qty: 30 | Fill #3

## 2018-12-12 MED FILL — SUMATRIPTAN SUCC 50 MG TAB: 50 | 30 days supply | Qty: 9 | Fill #0

## 2018-12-12 MED FILL — OLOPATADINE HCL 0.1% EYE DR: 0.1 | 18 days supply | Qty: 5 | Fill #1

## 2018-12-12 MED FILL — ATORVASTATIN CALCIUM 40 MG: 40 | 90 days supply | Qty: 90 | Fill #1

## 2018-12-12 MED FILL — METOPROLOL TARTRATE 100 MG: 100 | 90 days supply | Qty: 180 | Fill #0

## 2018-12-12 MED FILL — CYCLOBENZAPRINE 10 MG TAB: 10 | 60 days supply | Qty: 120 | Fill #1

## 2018-12-12 MED FILL — HYDROCHLOROTHIAZIDE 25 MG T: 25 | 60 days supply | Qty: 60 | Fill #2

## 2018-12-12 MED FILL — BUPROPION SR 150 MG TABLET: 150 | 60 days supply | Qty: 120 | Fill #1

## 2018-12-21 ENCOUNTER — Telehealth: Payer: Self-pay | Admitting: Cardiovascular Disease

## 2018-12-21 NOTE — Telephone Encounter (Signed)
Attempted to reach pt to set up virtual visit with Dr. Tresa Endo on 4/30. Phone numbers listed not working. Home and Mobile numbers are the same and unable to take calls. The "Other number" listed keeps hanging up with no ring.

## 2018-12-22 ENCOUNTER — Telehealth: Payer: BLUE CROSS/BLUE SHIELD | Admitting: Cardiovascular Disease

## 2019-01-30 ENCOUNTER — Ambulatory Visit: Payer: BLUE CROSS/BLUE SHIELD | Attending: Family Medicine | Admitting: Family Medicine

## 2019-01-30 ENCOUNTER — Other Ambulatory Visit: Payer: Self-pay

## 2019-01-30 ENCOUNTER — Encounter: Payer: Self-pay | Admitting: Family Medicine

## 2019-01-30 VITALS — BP 125/84 | HR 124 | Temp 98.4°F | Ht 67.0 in | Wt 219.0 lb

## 2019-01-30 DIAGNOSIS — M5416 Radiculopathy, lumbar region: Secondary | ICD-10-CM | POA: Diagnosis not present

## 2019-01-30 DIAGNOSIS — K219 Gastro-esophageal reflux disease without esophagitis: Secondary | ICD-10-CM | POA: Diagnosis not present

## 2019-01-30 DIAGNOSIS — G5603 Carpal tunnel syndrome, bilateral upper limbs: Secondary | ICD-10-CM

## 2019-01-30 DIAGNOSIS — M19042 Primary osteoarthritis, left hand: Secondary | ICD-10-CM

## 2019-01-30 DIAGNOSIS — F319 Bipolar disorder, unspecified: Secondary | ICD-10-CM

## 2019-01-30 DIAGNOSIS — J328 Other chronic sinusitis: Secondary | ICD-10-CM

## 2019-01-30 DIAGNOSIS — G43709 Chronic migraine without aura, not intractable, without status migrainosus: Secondary | ICD-10-CM | POA: Diagnosis not present

## 2019-01-30 DIAGNOSIS — M19041 Primary osteoarthritis, right hand: Secondary | ICD-10-CM

## 2019-01-30 DIAGNOSIS — R000 Tachycardia, unspecified: Secondary | ICD-10-CM | POA: Insufficient documentation

## 2019-01-30 DIAGNOSIS — I10 Essential (primary) hypertension: Secondary | ICD-10-CM

## 2019-01-30 MED ORDER — HYDROCHLOROTHIAZIDE 25 MG PO TABS: 25.0000 mg | ORAL_TABLET | Freq: Every day | ORAL | 3 refills | Status: DC

## 2019-01-30 MED ORDER — METOPROLOL TARTRATE 100 MG PO TABS: 100.0000 mg | ORAL_TABLET | Freq: Two times a day (BID) | ORAL | 3 refills | Status: DC

## 2019-01-30 MED ORDER — CYCLOBENZAPRINE HCL 10 MG PO TABS: 10.0000 mg | ORAL_TABLET | Freq: Two times a day (BID) | ORAL | 3 refills | Status: DC

## 2019-01-30 MED ORDER — DEXLANSOPRAZOLE 60 MG PO CPDR: DELAYED_RELEASE_CAPSULE | ORAL | 3 refills | Status: DC

## 2019-01-30 MED ORDER — ALBUTEROL SULFATE HFA 108 (90 BASE) MCG/ACT IN AERS: INHALATION_SPRAY | RESPIRATORY_TRACT | 3 refills | Status: DC

## 2019-01-30 MED ORDER — SUMATRIPTAN SUCCINATE 50 MG PO TABS: ORAL_TABLET | ORAL | 2 refills | Status: DC

## 2019-01-30 MED ORDER — MELOXICAM 15 MG PO TABS: 15.0000 mg | ORAL_TABLET | Freq: Every day | ORAL | 3 refills | Status: DC

## 2019-01-30 MED ORDER — CETIRIZINE HCL 10 MG PO TABS: 10.0000 mg | ORAL_TABLET | Freq: Every day | ORAL | 5 refills | Status: DC

## 2019-01-30 MED ORDER — AMLODIPINE BESYLATE 5 MG PO TABS: 5.0000 mg | ORAL_TABLET | Freq: Every day | ORAL | 3 refills | Status: DC

## 2019-01-30 MED FILL — DEXILANT DR 60 MG CAPSULE: 60 | 30 days supply | Qty: 30 | Fill #2

## 2019-01-30 MED FILL — SUMATRIPTAN SUCC 50 MG TAB: 50 | 30 days supply | Qty: 9 | Fill #1

## 2019-01-30 MED FILL — VENTOLIN HFA 90 MCG INHALER: 108 (90 BAS | 25 days supply | Qty: 18 | Fill #1

## 2019-01-30 MED FILL — HYDROCHLOROTHIAZIDE 25 MG T: 25 | 30 days supply | Qty: 30 | Fill #0

## 2019-01-30 MED FILL — ISOSORBIDE MN ER 30 MG TAB: 30 | 90 days supply | Qty: 90 | Fill #1

## 2019-01-30 MED FILL — AMLODIPINE BESYLATE 5 MG TA: 5 | 30 days supply | Qty: 30 | Fill #0

## 2019-01-30 MED FILL — CYCLOBENZAPRINE 10 MG TAB: 10 | 30 days supply | Qty: 60 | Fill #0

## 2019-01-30 MED FILL — MELOXICAM 15 MG TABLET: 15 | 30 days supply | Qty: 30 | Fill #0

## 2019-01-30 NOTE — Patient Instructions (Signed)
Carpal Tunnel Syndrome    Carpal tunnel syndrome is a condition that causes pain in your hand and arm. The carpal tunnel is a narrow area that is on the palm side of your wrist. Repeated wrist motion or certain diseases may cause swelling in the tunnel. This swelling can pinch the main nerve in the wrist (median nerve).  What are the causes?  This condition may be caused by:   Repeated wrist motions.   Wrist injuries.   Arthritis.   A sac of fluid (cyst) or abnormal growth (tumor) in the carpal tunnel.   Fluid buildup during pregnancy.  Sometimes the cause is not known.  What increases the risk?  The following factors may make you more likely to develop this condition:   Having a job in which you move your wrist in the same way many times. This includes jobs like being a butcher or a cashier.   Being a woman.   Having other health conditions, such as:  ? Diabetes.  ? Obesity.  ? A thyroid gland that is not active enough (hypothyroidism).  ? Kidney failure.  What are the signs or symptoms?  Symptoms of this condition include:   A tingling feeling in your fingers.   Tingling or a loss of feeling (numbness) in your hand.   Pain in your entire arm. This pain may get worse when you bend your wrist and elbow for a long time.   Pain in your wrist that goes up your arm to your shoulder.   Pain that goes down into your palm or fingers.   A weak feeling in your hands. You may find it hard to grab and hold items.  You may feel worse at night.  How is this diagnosed?  This condition is diagnosed with a medical history and physical exam. You may also have tests, such as:   Electromyogram (EMG). This test checks the signals that the nerves send to the muscles.   Nerve conduction study. This test checks how well signals pass through your nerves.   Imaging tests, such as X-rays, ultrasound, and MRI. These tests check for what might be the cause of your condition.  How is this treated?  This condition may be treated  with:   Lifestyle changes. You will be asked to stop or change the activity that caused your problem.   Doing exercise and activities that make bones and muscles stronger (physical therapy).   Learning how to use your hand again (occupational therapy).   Medicines for pain and swelling (inflammation). You may have injections in your wrist.   A wrist splint.   Surgery.  Follow these instructions at home:  If you have a splint:   Wear the splint as told by your doctor. Remove it only as told by your doctor.   Loosen the splint if your fingers:  ? Tingle.  ? Lose feeling (become numb).  ? Turn cold and blue.   Keep the splint clean.   If the splint is not waterproof:  ? Do not let it get wet.  ? Cover it with a watertight covering when you take a bath or a shower.  Managing pain, stiffness, and swelling     If told, put ice on the painful area:  ? If you have a removable splint, remove it as told by your doctor.  ? Put ice in a plastic bag.  ? Place a towel between your skin and the bag.  ? Leave the   ice on for 20 minutes, 2-3 times per day.  General instructions   Take over-the-counter and prescription medicines only as told by your doctor.   Rest your wrist from any activity that may cause pain. If needed, talk with your boss at work about changes that can help your wrist heal.   Do any exercises as told by your doctor, physical therapist, or occupational therapist.   Keep all follow-up visits as told by your doctor. This is important.  Contact a doctor if:   You have new symptoms.   Medicine does not help your pain.   Your symptoms get worse.  Get help right away if:   You have very bad numbness or tingling in your wrist or hand.  Summary   Carpal tunnel syndrome is a condition that causes pain in your hand and arm.   It is often caused by repeated wrist motions.   Lifestyle changes and medicines are used to treat this problem. Surgery may help in very bad cases.   Follow your doctor's  instructions about wearing a splint, resting your wrist, keeping follow-up visits, and calling for help.  This information is not intended to replace advice given to you by your health care provider. Make sure you discuss any questions you have with your health care provider.  Document Released: 07/30/2011 Document Revised: 12/17/2017 Document Reviewed: 12/17/2017  Elsevier Interactive Patient Education  2019 [employer]

## 2019-01-30 NOTE — Progress Notes (Signed)
Patient is having leg and back pain  Patient states that hands are very painful in the morning due to arthritis.

## 2019-01-30 NOTE — Progress Notes (Signed)
Subjective:  Patient ID: Amanda [name], female    DOB: 09-15-1965  Age: 53 y.o. MRN: [id number]  CC: Hypertension and Back Pain   HPI Amanda [name] is a 53 year old female with a history of hypertension, bipolar disorder, neuropathy, chronic low back pain, PUD/GERD, Migraine, carpal tunnel syndrome, PEA arrest in the setting of cocaine abuse in 08/2018 who comes in today for a follow-up Visit. She complains of worsening carpal tunnel syndrome in her left hand; she has had carpal tunnel release surgery in her right hand.  Complains of tingling of her fingers and is unable to find the brace which she used previously. She also complains of stiffness in her hands on waking up in the morning and is wondering if she has osteoarthritis.  She has chronic low back pain which radiates to her legs despite compliance with Flexeril, Cymbalta, Lyrica.  She denies recent falls or loss of sphincteric function. Her bipolar disorder is stable and she is currently followed by mental health at [facility]. Migraines are controlled on her current regimen. She is tachycardic today denies dyspnea or chest pains but informs me that if she forgets to take her isosorbide nitrate she develops chest pain.  She denies cocaine use.  Past Medical History:  Diagnosis Date  . Anxiety   . Arthritis    lower back  . Asthma   . Bipolar disorder (HCC)   . Carpal tunnel syndrome, bilateral   . Chronic back pain   . Depression   . Diverticulosis   . Fx. left wrist   . GERD (gastroesophageal reflux disease)   . Heart murmur    "born with"  . Heart murmur   . HLD (hyperlipidemia)   . Hypertension   . IBS (irritable bowel syndrome)   . Internal hemorrhoids   . Muscle spasms of neck   . Neuropathy   . PUD (peptic ulcer disease)     Past Surgical History:  Procedure Laterality Date  . ARTHRODESIS METATARSALPHALANGEAL JOINT (MTPJ) Right 04/29/2016   Procedure: RIGHT GREAT TOE METATARSOPHALANGEAL JOINT (MTPJ) FUSION,  HARDWARE REMOVAL;  Surgeon: Tarry Kos, MD;  Location: [facility];  Service: Orthopedics;  Laterality: Right;  RIGHT GREAT TOE METATARSOPHALANGEAL JOINT (MTPJ) FUSION, HARDWARE REMOVAL  . CARPAL TUNNEL RELEASE Right 2003  . CHOLECYSTECTOMY N/A 02/09/2014   Procedure: LAPAROSCOPIC CHOLECYSTECTOMY WITH INTRAOPERATIVE CHOLANGIOGRAM;  Surgeon: Valarie Merino, MD;  Location: [facility];  Service: General;  Laterality: N/A;  . CHOLECYSTECTOMY    . FOOT SURGERY    . HAMMER TOE FUSION Bilateral 2008  . HARDWARE REMOVAL Right 04/29/2016   Procedure: HARDWARE REMOVAL;  Surgeon: Tarry Kos, MD;  Location: [facility];  Service: Orthopedics;  Laterality: Right;  HARDWARE REMOVAL  . LEFT HEART CATH AND CORONARY ANGIOGRAPHY N/A 08/30/2018   Procedure: LEFT HEART CATH AND CORONARY ANGIOGRAPHY;  Surgeon: Marykay Lex, MD;  Location: [facility];  Service: Cardiovascular;  Laterality: N/A;  . ROTATOR CUFF REPAIR Right 2011  . WRIST FRACTURE SURGERY Left     Family History  Problem Relation Age of Onset  . Pancreatic cancer Mother   . CAD Father   . Diabetes Mellitus II Father   . Heart failure Father   . Diabetes Father   . Breast cancer Paternal Grandmother   . HIV/AIDS Brother   . Other Sister        Brain tumor  . Colon cancer Neg Hx  Allergies  Allergen Reactions  . Penicillins Anaphylaxis    Has patient had a PCN reaction causing immediate rash, facial/tongue/throat swelling, SOB or lightheadedness with hypotension: Yes Has patient had a PCN reaction causing severe rash involving mucus membranes or skin necrosis: Yes Has patient had a PCN reaction that required hospitalization Yes Has patient had a PCN reaction occurring within the last 10 years: No-more than 10 years ago If all of the above answers are "NO", then may proceed with Cephalosporin use.   Marland Kitchen. Penicillins Anaphylaxis    DID THE REACTION INVOLVE: Swelling of the face/tongue/throat, SOB, or  low BP? Yes Sudden or severe rash/hives, skin peeling, or the inside of the mouth or nose? Yes Did it require medical treatment? No When did it last happen? Within the past 10 years If all above answers are "NO", may proceed with cephalosporin use.    Jonne Ply. Asa [Aspirin] Rash  . Aspirin Rash    Outpatient Medications Prior to Visit  Medication Sig Dispense Refill  . atorvastatin (LIPITOR) 40 MG tablet Take 1 tablet (40 mg total) by mouth daily. 30 tablet 6  . buPROPion (WELLBUTRIN SR) 150 MG 12 hr tablet Take 150 mg by mouth 2 (two) times daily.    . cloNIDine (CATAPRES) 0.1 MG tablet Take 1 tablet (0.1 mg total) by mouth at bedtime as needed. For hot flashes 30 tablet 3  . colestipol (COLESTID) 1 g tablet Take 1 tablet (1 g total) by mouth every morning. 30 tablet 4  . divalproex (DEPAKOTE) 500 MG DR tablet Take 500 mg by mouth 3 (three) times daily.    . ergocalciferol (DRISDOL) 1.25 MG (50000 UT) capsule Take 1 capsule (50,000 Units total) by mouth once a week. 9 capsule 1  . fluticasone (FLONASE) 50 MCG/ACT nasal spray PLACE 2 SPRAYS INTO BOTH NOSTRILS DAILY. 16 g 1  . haloperidol (HALDOL) 5 MG tablet Take 1 tablet by mouth daily.  0  . hydrOXYzine (ATARAX/VISTARIL) 50 MG tablet Take 1 tablet (50 mg total) by mouth 3 (three) times daily as needed for anxiety or itching. 90 tablet 2  . isosorbide mononitrate (IMDUR) 30 MG 24 hr tablet Take 1 tablet (30 mg total) by mouth daily. 90 tablet 3  . olopatadine (PATANOL) 0.1 % ophthalmic solution PLACE 1 DROP INTO BOTH EYES 2 TIMES DAILY. 5 mL 2  . pregabalin (LYRICA) 75 MG capsule Take 1 capsule (75 mg total) by mouth 2 (two) times daily. 180 capsule 3  . topiramate (TOPAMAX) 100 MG tablet Take 200 mg by mouth 2 (two) times daily.    . traZODone (DESYREL) 300 MG tablet Take 1 tablet (300 mg total) by mouth at bedtime. 30 tablet 0  . albuterol (VENTOLIN HFA) 108 (90 Base) MCG/ACT inhaler INHALE 2 PUFFS INTO THE LUNGS EVERY 6 HOURS AS NEEDED FOR  WHEEZING OR SHORTNESS OF BREATH. 18 g 3  . amLODipine (NORVASC) 5 MG tablet Take 1 tablet (5 mg total) by mouth daily. 30 tablet 3  . cetirizine (ZYRTEC) 10 MG tablet Take 1 tablet (10 mg total) by mouth daily. 30 tablet 5  . cyclobenzaprine (FLEXERIL) 10 MG tablet Take 1 tablet (10 mg total) by mouth 2 (two) times daily. prn 60 tablet 2  . DEXILANT 60 MG capsule TAKE 1 CAPSULE (60 MG TOTAL) BY MOUTH DAILY. 30 capsule 2  . hydrochlorothiazide (HYDRODIURIL) 25 MG tablet Take 1 tablet (25 mg total) by mouth daily. 30 tablet 3  . meloxicam (MOBIC) 15 MG tablet Take  1 tablet (15 mg total) by mouth daily. 30 tablet 3  . metoprolol tartrate (LOPRESSOR) 100 MG tablet Take 1 tablet (100 mg total) by mouth 2 (two) times daily. 60 tablet 3  . SUMAtriptan (IMITREX) 50 MG tablet TAKE 1 TABLET BY MOUTH AT THE ONSET OF A MIGRAINE. MAY REPEAT IN 2 HOURS IF HEADACHE PERSISTS OR RECURS. MAXIMUM DAILY DOSE 200 MG 10 tablet 2  . aspirin EC 81 MG tablet Take 1 tablet (81 mg total) by mouth daily. (Patient not taking: Reported on 01/30/2019) 150 tablet 0  . lidocaine (LIDODERM) 5 % Place 1 patch onto the skin daily. Remove & Discard patch within 12 hours or as directed by MD (Patient not taking: Reported on 01/30/2019) 30 patch 3   Facility-Administered Medications Prior to Visit  Medication Dose Route Frequency Provider Last Rate Last Dose  . lidocaine (PF) (XYLOCAINE) 1 % injection 0.3 mL  0.3 mL Other Once Magnus Sinning, MD         ROS Review of Systems  Constitutional: Negative for activity change, appetite change and fatigue.  HENT: Negative for congestion, sinus pressure and sore throat.   Eyes: Negative for visual disturbance.  Respiratory: Negative for cough, chest tightness, shortness of breath and wheezing.   Cardiovascular: Negative for chest pain and palpitations.  Gastrointestinal: Negative for abdominal distention, abdominal pain and constipation.  Endocrine: Negative for polydipsia.   Genitourinary: Negative for dysuria and frequency.  Musculoskeletal:       See HPI  Skin: Negative for rash.  Neurological: Negative for tremors, light-headedness and numbness.  Hematological: Does not bruise/bleed easily.  Psychiatric/Behavioral: Negative for agitation and behavioral problems.    Objective:  BP 125/84   Pulse (!) 124   Temp 98.4 F (36.9 C) (Oral)   Ht 5\' 7"  (1.702 m)   Wt 219 lb (99.3 kg)   SpO2 96%   BMI 34.30 kg/m   BP/Weight 01/30/2019 10/25/2018 1/82/9937  Systolic BP 169 678 938  Diastolic BP 84 80 84  Wt. (Lbs) 219 223.8 217.2  BMI 34.3 35.05 34.02  Some encounter information is confidential and restricted. Go to Review Flowsheets activity to see all data.      Physical Exam Constitutional:      Appearance: She is well-developed.  Cardiovascular:     Rate and Rhythm: Tachycardia present.     Heart sounds: Normal heart sounds. No murmur.  Pulmonary:     Effort: Pulmonary effort is normal.     Breath sounds: Normal breath sounds. No wheezing or rales.  Chest:     Chest wall: No tenderness.  Abdominal:     General: Bowel sounds are normal. There is no distension.     Palpations: Abdomen is soft. There is no mass.     Tenderness: There is no abdominal tenderness.  Musculoskeletal: Normal range of motion.     Comments: Positive Tinel sign in left wrist Normal appearance of the metacarpophalangeal and interphalangeal joints Able to make a fist  Neurological:     Mental Status: She is alert and oriented to person, place, and time.     CMP Latest Ref Rng & Units 09/13/2018 08/30/2018 08/30/2018  Glucose 65 - 99 mg/dL 90 - 97  BUN 6 - 24 mg/dL 8 - 16  Creatinine 0.57 - 1.00 mg/dL 1.12(H) 1.24(H) 1.31(H)  Sodium 134 - 144 mmol/L 140 - 137  Potassium 3.5 - 5.2 mmol/L 4.4 - 4.2  Chloride 96 - 106 mmol/L 107(H) - 110  CO2  20 - 29 mmol/L 19(L) - 20(L)  Calcium 8.7 - 10.2 mg/dL 9.3 - 8.7(L)  Total Protein 6.0 - 8.5 g/dL 6.7 - -  Total Bilirubin 0.0 -  1.2 mg/dL <0.9<0.2 - -  Alkaline Phos 39 - 117 IU/L 138(H) - -  AST 0 - 40 IU/L 13 - -  ALT 0 - 32 IU/L 17 - -    Lipid Panel     Component Value Date/Time   CHOL 205 (H) 09/30/2017 1104   TRIG 161 (H) 09/30/2017 1104   HDL 52 09/30/2017 1104   CHOLHDL 3.9 09/30/2017 1104   CHOLHDL 2.9 11/27/2015 1038   VLDL 9 11/27/2015 1038   LDLCALC 121 (H) 09/30/2017 1104    CBC    Component Value Date/Time   WBC 6.7 09/13/2018 1528   WBC 6.0 08/30/2018 1053   RBC 3.36 (L) 09/13/2018 1528   RBC 3.30 (L) 08/30/2018 1053   HGB 11.5 09/13/2018 1528   HCT 32.9 (L) 09/13/2018 1528   PLT 244 09/13/2018 1528   MCV 98 (H) 09/13/2018 1528   MCH 34.2 (H) 09/13/2018 1528   MCH 32.7 08/30/2018 1053   MCHC 35.0 09/13/2018 1528   MCHC 31.7 08/30/2018 1053   RDW 12.8 09/13/2018 1528   LYMPHSABS 2.2 09/13/2018 1528   MONOABS 0.7 08/28/2018 0315   EOSABS 0.2 09/13/2018 1528   BASOSABS 0.1 09/13/2018 1528    Lab Results  Component Value Date   HGBA1C 4.3 (L) 08/26/2018    Assessment & Plan:   1. Lumbar radiculopathy Uncontrolled Advised to apply heat Refer to physical therapy - cyclobenzaprine (FLEXERIL) 10 MG tablet; Take 1 tablet (10 mg total) by mouth 2 (two) times daily. prn  Dispense: 60 tablet; Refill: 3 - Ambulatory referral to Physical Therapy  2. Gastroesophageal reflux disease without esophagitis Stable - dexlansoprazole (DEXILANT) 60 MG capsule; TAKE 1 CAPSULE (60 MG TOTAL) BY MOUTH DAILY.  Dispense: 30 capsule; Refill: 3  3. Chronic migraine without aura without status migrainosus, not intractable Controlled - SUMAtriptan (IMITREX) 50 MG tablet; TAKE 1 TABLET BY MOUTH AT THE ONSET OF A MIGRAINE. MAY REPEAT IN 2 HOURS IF HEADACHE PERSISTS OR RECURS. MAXIMUM DAILY DOSE 200 MG  Dispense: 10 tablet; Refill: 2  4. Other chronic sinusitis Stable - cetirizine (ZYRTEC) 10 MG tablet; Take 1 tablet (10 mg total) by mouth daily.  Dispense: 30 tablet; Refill: 5  5. Essential  hypertension Controlled Counseled on blood pressure goal of less than 130/80, low-sodium, DASH diet, medication compliance, 150 minutes of moderate intensity exercise per week. Discussed medication compliance, adverse effects. - metoprolol tartrate (LOPRESSOR) 100 MG tablet; Take 1 tablet (100 mg total) by mouth 2 (two) times daily.  Dispense: 60 tablet; Refill: 3 - hydrochlorothiazide (HYDRODIURIL) 25 MG tablet; Take 1 tablet (25 mg total) by mouth daily.  Dispense: 30 tablet; Refill: 3 - amLODipine (NORVASC) 5 MG tablet; Take 1 tablet (5 mg total) by mouth daily.  Dispense: 30 tablet; Refill: 3  6. Bilateral carpal tunnel syndrome Status post release surgery and right hand - Ambulatory referral to Orthopedic Surgery - meloxicam (MOBIC) 15 MG tablet; Take 1 tablet (15 mg total) by mouth daily.  Dispense: 30 tablet; Refill: 3  7. Primary osteoarthritis of both hands Currently on NSAID  8. Bipolar affective disorder, remission status unspecified (HCC) Stable [facility]  9. Tachycardia Secondary to not using metoprolol today EKG reveals sinus tachycardia, possible left atrial hypertrophy, LVH We will bring back to repeat  EKG in 1 week given this EKG is abnormal compared to previous  My CMA call her to this effect   Meds ordered this encounter  Medications  . cyclobenzaprine (FLEXERIL) 10 MG tablet    Sig: Take 1 tablet (10 mg total) by mouth 2 (two) times daily. prn    Dispense:  60 tablet    Refill:  3  . albuterol (VENTOLIN HFA) 108 (90 Base) MCG/ACT inhaler    Sig: INHALE 2 PUFFS INTO THE LUNGS EVERY 6 HOURS AS NEEDED FOR WHEEZING OR SHORTNESS OF BREATH.    Dispense:  18 g    Refill:  3  . dexlansoprazole (DEXILANT) 60 MG capsule    Sig: TAKE 1 CAPSULE (60 MG TOTAL) BY MOUTH DAILY.    Dispense:  30 capsule    Refill:  3  . SUMAtriptan (IMITREX) 50 MG tablet    Sig: TAKE 1 TABLET BY MOUTH AT THE ONSET OF A MIGRAINE. MAY REPEAT IN 2 HOURS IF HEADACHE  PERSISTS OR RECURS. MAXIMUM DAILY DOSE 200 MG    Dispense:  10 tablet    Refill:  2  . cetirizine (ZYRTEC) 10 MG tablet    Sig: Take 1 tablet (10 mg total) by mouth daily.    Dispense:  30 tablet    Refill:  5  . metoprolol tartrate (LOPRESSOR) 100 MG tablet    Sig: Take 1 tablet (100 mg total) by mouth 2 (two) times daily.    Dispense:  60 tablet    Refill:  3  . meloxicam (MOBIC) 15 MG tablet    Sig: Take 1 tablet (15 mg total) by mouth daily.    Dispense:  30 tablet    Refill:  3  . hydrochlorothiazide (HYDRODIURIL) 25 MG tablet    Sig: Take 1 tablet (25 mg total) by mouth daily.    Dispense:  30 tablet    Refill:  3    Discontinue previous dose  . amLODipine (NORVASC) 5 MG tablet    Sig: Take 1 tablet (5 mg total) by mouth daily.    Dispense:  30 tablet    Refill:  3    Follow-up: Return in about 3 months (around 05/02/2019) for Medical conditions.       Hoy RegisterEnobong Ernisha Sorn, MD, FAAFP. [facility] [address] [place], [place] [phone]   01/30/2019, 3:03 PM

## 2019-02-13 ENCOUNTER — Ambulatory Visit: Payer: BLUE CROSS/BLUE SHIELD | Admitting: Family Medicine

## 2019-02-14 ENCOUNTER — Ambulatory Visit: Payer: BLUE CROSS/BLUE SHIELD | Admitting: Physical Therapy

## 2019-02-20 NOTE — Telephone Encounter (Signed)
Opened in error

## 2019-02-21 ENCOUNTER — Ambulatory Visit: Payer: BLUE CROSS/BLUE SHIELD | Admitting: Orthopaedic Surgery

## 2019-02-21 MED FILL — VIT D2 1.25 MG (50,000 UNIT: 1.25 MG | 13 days supply | Qty: 2 | Fill #4

## 2019-02-21 MED FILL — cloNIDine HCL 0.1 MG TABS: 0.1 | 30 days supply | Qty: 30 | Fill #3

## 2019-02-21 MED FILL — hydrOXYzine HCL 50 MG TABS: 50 | 90 days supply | Qty: 270 | Fill #0

## 2019-02-23 ENCOUNTER — Other Ambulatory Visit: Payer: Self-pay

## 2019-02-23 ENCOUNTER — Encounter: Payer: Self-pay | Admitting: Family Medicine

## 2019-02-23 ENCOUNTER — Ambulatory Visit: Payer: BLUE CROSS/BLUE SHIELD | Attending: Family Medicine | Admitting: Family Medicine

## 2019-02-23 VITALS — BP 95/65 | HR 62 | Temp 98.5°F | Ht 67.0 in | Wt 218.0 lb

## 2019-02-23 DIAGNOSIS — J45909 Unspecified asthma, uncomplicated: Secondary | ICD-10-CM | POA: Diagnosis not present

## 2019-02-23 DIAGNOSIS — Z8249 Family history of ischemic heart disease and other diseases of the circulatory system: Secondary | ICD-10-CM | POA: Insufficient documentation

## 2019-02-23 DIAGNOSIS — Z886 Allergy status to analgesic agent status: Secondary | ICD-10-CM | POA: Diagnosis not present

## 2019-02-23 DIAGNOSIS — Z800 Family history of malignant neoplasm of digestive organs: Secondary | ICD-10-CM | POA: Diagnosis not present

## 2019-02-23 DIAGNOSIS — Z9049 Acquired absence of other specified parts of digestive tract: Secondary | ICD-10-CM | POA: Insufficient documentation

## 2019-02-23 DIAGNOSIS — Z79899 Other long term (current) drug therapy: Secondary | ICD-10-CM | POA: Insufficient documentation

## 2019-02-23 DIAGNOSIS — F419 Anxiety disorder, unspecified: Secondary | ICD-10-CM | POA: Diagnosis not present

## 2019-02-23 DIAGNOSIS — Z880 Allergy status to penicillin: Secondary | ICD-10-CM | POA: Insufficient documentation

## 2019-02-23 DIAGNOSIS — I10 Essential (primary) hypertension: Secondary | ICD-10-CM | POA: Insufficient documentation

## 2019-02-23 DIAGNOSIS — Z791 Long term (current) use of non-steroidal anti-inflammatories (NSAID): Secondary | ICD-10-CM | POA: Insufficient documentation

## 2019-02-23 DIAGNOSIS — Z833 Family history of diabetes mellitus: Secondary | ICD-10-CM | POA: Diagnosis not present

## 2019-02-23 DIAGNOSIS — Z8674 Personal history of sudden cardiac arrest: Secondary | ICD-10-CM | POA: Diagnosis not present

## 2019-02-23 DIAGNOSIS — K219 Gastro-esophageal reflux disease without esophagitis: Secondary | ICD-10-CM | POA: Diagnosis not present

## 2019-02-23 DIAGNOSIS — G8929 Other chronic pain: Secondary | ICD-10-CM | POA: Insufficient documentation

## 2019-02-23 DIAGNOSIS — Z803 Family history of malignant neoplasm of breast: Secondary | ICD-10-CM | POA: Insufficient documentation

## 2019-02-23 DIAGNOSIS — Z8711 Personal history of peptic ulcer disease: Secondary | ICD-10-CM | POA: Diagnosis not present

## 2019-02-23 DIAGNOSIS — F319 Bipolar disorder, unspecified: Secondary | ICD-10-CM | POA: Insufficient documentation

## 2019-02-23 DIAGNOSIS — R9431 Abnormal electrocardiogram [ECG] [EKG]: Secondary | ICD-10-CM | POA: Diagnosis not present

## 2019-02-23 DIAGNOSIS — M47819 Spondylosis without myelopathy or radiculopathy, site unspecified: Secondary | ICD-10-CM | POA: Diagnosis not present

## 2019-02-23 DIAGNOSIS — M5416 Radiculopathy, lumbar region: Secondary | ICD-10-CM | POA: Diagnosis present

## 2019-02-23 DIAGNOSIS — E785 Hyperlipidemia, unspecified: Secondary | ICD-10-CM | POA: Diagnosis not present

## 2019-02-23 NOTE — Progress Notes (Signed)
Subjective:  Patient ID: Amanda [name], female    DOB: 1965-10-25  Age: 53 y.o. MRN: [id number]  CC: Back Pain   HPI Amanda [name] Amanda [name] is a 53 year old female with a history of hypertension, bipolar disorder, neuropathy, chronic low back pain, PUD/GERD, Migraine, carpal tunnel syndrome, PEA arrest in the setting of cocaine abuse in 08/2018 who comes in today for a follow-up Visit. At her last office visit she was tachycardic with a heart rate of 124 and did have a prolonged QTC of 476, possible left atrial enlargement, LVH.  She had endorsed not taking her metoprolol. Today in the clinic her EKG reveals a normal heart rate of 62, QTc of 493. Denies chest pains, dyspnea and has quit cocaine use since her cardiac arrest 6 months ago.  She continues to experience low back pain which radiates down her legs with associated numbness and this is uncontrolled on her current pain regimen.  I had referred her to physical therapy but was informed [facility] and she is requesting referral elsewhere.  Past Medical History:  Diagnosis Date  . Anxiety   . Arthritis    lower back  . Asthma   . Bipolar disorder (HCC)   . Carpal tunnel syndrome, bilateral   . Chronic back pain   . Depression   . Diverticulosis   . Fx. left wrist   . GERD (gastroesophageal reflux disease)   . Heart murmur    "born with"  . Heart murmur   . HLD (hyperlipidemia)   . Hypertension   . IBS (irritable bowel syndrome)   . Internal hemorrhoids   . Muscle spasms of neck   . Neuropathy   . PUD (peptic ulcer disease)     Past Surgical History:  Procedure Laterality Date  . ARTHRODESIS METATARSALPHALANGEAL JOINT (MTPJ) Right 04/29/2016   Procedure: RIGHT GREAT TOE METATARSOPHALANGEAL JOINT (MTPJ) FUSION, HARDWARE REMOVAL;  Surgeon: Tarry KosNaiping M Xu, MD;  Location: [facility];  Service: Orthopedics;  Laterality: Right;  RIGHT  GREAT TOE METATARSOPHALANGEAL JOINT (MTPJ) FUSION, HARDWARE REMOVAL  . CARPAL TUNNEL RELEASE Right 2003  . CHOLECYSTECTOMY N/A 02/09/2014   Procedure: LAPAROSCOPIC CHOLECYSTECTOMY WITH INTRAOPERATIVE CHOLANGIOGRAM;  Surgeon: Valarie MerinoMatthew B Martin, MD;  Location: [facility];  Service: General;  Laterality: N/A;  . CHOLECYSTECTOMY    . FOOT SURGERY    . HAMMER TOE FUSION Bilateral 2008  . HARDWARE REMOVAL Right 04/29/2016   Procedure: HARDWARE REMOVAL;  Surgeon: Tarry KosNaiping M Xu, MD;  Location: [facility];  Service: Orthopedics;  Laterality: Right;  HARDWARE REMOVAL  . LEFT HEART CATH AND CORONARY ANGIOGRAPHY N/A 08/30/2018   Procedure: LEFT HEART CATH AND CORONARY ANGIOGRAPHY;  Surgeon: Marykay LexHarding, David W, MD;  [facility];  Service: Cardiovascular;  Laterality: N/A;  . ROTATOR CUFF REPAIR Right 2011  . WRIST FRACTURE SURGERY Left     Family History  Problem Relation Age of Onset  . Pancreatic cancer Mother   . CAD Father   . Diabetes Mellitus II Father   . Heart failure Father   . Diabetes Father   . Breast cancer Paternal Grandmother   . HIV/AIDS Brother   . Other Sister        Brain tumor  . Colon cancer Neg Hx     Allergies  Allergen Reactions  . Penicillins Anaphylaxis    Has patient had a PCN reaction  causing immediate rash, facial/tongue/throat swelling, SOB or lightheadedness with hypotension: Yes Has patient had a PCN reaction causing severe rash involving mucus membranes or skin necrosis: Yes Has patient had a PCN reaction that required hospitalization Yes Has patient had a PCN reaction occurring within the last 10 years: No-more than 10 years ago If all of the above answers are "NO", then may proceed with Cephalosporin use.   Marland Kitchen. Penicillins Anaphylaxis    DID THE REACTION INVOLVE: Swelling of the face/tongue/throat, SOB, or low BP? Yes Sudden or severe rash/hives, skin peeling, or the inside of the mouth or nose? Yes Did it require medical treatment? No  When did it last happen? Within the past 10 years If all above answers are "NO", may proceed with cephalosporin use.    Jonne Ply. Asa [Aspirin] Rash  . Aspirin Rash    Outpatient Medications Prior to Visit  Medication Sig Dispense Refill  . albuterol (VENTOLIN HFA) 108 (90 Base) MCG/ACT inhaler INHALE 2 PUFFS INTO THE LUNGS EVERY 6 HOURS AS NEEDED FOR WHEEZING OR SHORTNESS OF BREATH. 18 g 3  . amLODipine (NORVASC) 5 MG tablet Take 1 tablet (5 mg total) by mouth daily. 30 tablet 3  . atorvastatin (LIPITOR) 40 MG tablet Take 1 tablet (40 mg total) by mouth daily. 30 tablet 6  . buPROPion (WELLBUTRIN SR) 150 MG 12 hr tablet Take 150 mg by mouth 2 (two) times daily.    . cetirizine (ZYRTEC) 10 MG tablet Take 1 tablet (10 mg total) by mouth daily. 30 tablet 5  . cloNIDine (CATAPRES) 0.1 MG tablet Take 1 tablet (0.1 mg total) by mouth at bedtime as needed. For hot flashes 30 tablet 3  . colestipol (COLESTID) 1 g tablet Take 1 tablet (1 g total) by mouth every morning. 30 tablet 4  . cyclobenzaprine (FLEXERIL) 10 MG tablet Take 1 tablet (10 mg total) by mouth 2 (two) times daily. prn 60 tablet 3  . dexlansoprazole (DEXILANT) 60 MG capsule TAKE 1 CAPSULE (60 MG TOTAL) BY MOUTH DAILY. 30 capsule 3  . divalproex (DEPAKOTE) 500 MG DR tablet Take 500 mg by mouth 3 (three) times daily.    . ergocalciferol (DRISDOL) 1.25 MG (50000 UT) capsule Take 1 capsule (50,000 Units total) by mouth once a week. 9 capsule 1  . fluticasone (FLONASE) 50 MCG/ACT nasal spray PLACE 2 SPRAYS INTO BOTH NOSTRILS DAILY. 16 g 1  . haloperidol (HALDOL) 5 MG tablet Take 1 tablet by mouth daily.  0  . hydrochlorothiazide (HYDRODIURIL) 25 MG tablet Take 1 tablet (25 mg total) by mouth daily. 30 tablet 3  . hydrOXYzine (ATARAX/VISTARIL) 50 MG tablet Take 1 tablet (50 mg total) by mouth 3 (three) times daily as needed for anxiety or itching. 90 tablet 2  . isosorbide mononitrate (IMDUR) 30 MG 24 hr tablet Take 1 tablet (30 mg total) by  mouth daily. 90 tablet 3  . meloxicam (MOBIC) 15 MG tablet Take 1 tablet (15 mg total) by mouth daily. 30 tablet 3  . metoprolol tartrate (LOPRESSOR) 100 MG tablet Take 1 tablet (100 mg total) by mouth 2 (two) times daily. 60 tablet 3  . olopatadine (PATANOL) 0.1 % ophthalmic solution PLACE 1 DROP INTO BOTH EYES 2 TIMES DAILY. 5 mL 2  . pregabalin (LYRICA) 75 MG capsule Take 1 capsule (75 mg total) by mouth 2 (two) times daily. 180 capsule 3  . SUMAtriptan (IMITREX) 50 MG tablet TAKE 1 TABLET BY MOUTH AT THE ONSET OF A MIGRAINE. MAY REPEAT  IN 2 HOURS IF HEADACHE PERSISTS OR RECURS. MAXIMUM DAILY DOSE 200 MG 10 tablet 2  . topiramate (TOPAMAX) 100 MG tablet Take 200 mg by mouth 2 (two) times daily.    . traZODone (DESYREL) 300 MG tablet Take 1 tablet (300 mg total) by mouth at bedtime. 30 tablet 0  . aspirin EC 81 MG tablet Take 1 tablet (81 mg total) by mouth daily. (Patient not taking: Reported on 01/30/2019) 150 tablet 0  . lidocaine (LIDODERM) 5 % Place 1 patch onto the skin daily. Remove & Discard patch within 12 hours or as directed by MD (Patient not taking: Reported on 01/30/2019) 30 patch 3   Facility-Administered Medications Prior to Visit  Medication Dose Route Frequency Provider Last Rate Last Dose  . lidocaine (PF) (XYLOCAINE) 1 % injection 0.3 mL  0.3 mL Other Once Tyrell AntonioNewton, Frederic, MD         ROS Review of Systems  Constitutional: Negative for activity change, appetite change and fatigue.  HENT: Negative for congestion, sinus pressure and sore throat.   Eyes: Negative for visual disturbance.  Respiratory: Negative for cough, chest tightness, shortness of breath and wheezing.   Cardiovascular: Negative for chest pain and palpitations.  Gastrointestinal: Negative for abdominal distention, abdominal pain and constipation.  Endocrine: Negative for polydipsia.  Genitourinary: Negative for dysuria and frequency.  Musculoskeletal: Positive for back pain. Negative for arthralgias.   Skin: Negative for rash.  Neurological: Positive for numbness. Negative for tremors and light-headedness.  Hematological: Does not bruise/bleed easily.  Psychiatric/Behavioral: Negative for agitation and behavioral problems.    Objective:  BP 95/65   Pulse 62   Temp 98.5 F (36.9 C) (Oral)   Ht 5\' 7"  (1.702 m)   Wt 218 lb (98.9 kg)   SpO2 98%   BMI 34.14 kg/m   BP/Weight 02/23/2019 01/30/2019 10/25/2018  Systolic BP 95 125 113  Diastolic BP 65 84 80  Wt. (Lbs) 218 219 223.8  BMI 34.14 34.3 35.05  Some encounter information is confidential and restricted. Go to Review Flowsheets activity to see all data.      Physical Exam  CMP Latest Ref Rng & Units 09/13/2018 08/30/2018 08/30/2018  Glucose 65 - 99 mg/dL 90 - 97  BUN 6 - 24 mg/dL 8 - 16  Creatinine 1.610.57 - 1.00 mg/dL 0.96(E1.12(H) 4.54(U1.24(H) 9.81(X1.31(H)  Sodium 134 - 144 mmol/L 140 - 137  Potassium 3.5 - 5.2 mmol/L 4.4 - 4.2  Chloride 96 - 106 mmol/L 107(H) - 110  CO2 20 - 29 mmol/L 19(L) - 20(L)  Calcium 8.7 - 10.2 mg/dL 9.3 - 8.7(L)  Total Protein 6.0 - 8.5 g/dL 6.7 - -  Total Bilirubin 0.0 - 1.2 mg/dL <9.1<0.2 - -  Alkaline Phos 39 - 117 IU/L 138(H) - -  AST 0 - 40 IU/L 13 - -  ALT 0 - 32 IU/L 17 - -    Lipid Panel     Component Value Date/Time   CHOL 205 (H) 09/30/2017 1104   TRIG 161 (H) 09/30/2017 1104   HDL 52 09/30/2017 1104   CHOLHDL 3.9 09/30/2017 1104   CHOLHDL 2.9 11/27/2015 1038   VLDL 9 11/27/2015 1038   LDLCALC 121 (H) 09/30/2017 1104    CBC    Component Value Date/Time   WBC 6.7 09/13/2018 1528   WBC 6.0 08/30/2018 1053   RBC 3.36 (L) 09/13/2018 1528   RBC 3.30 (L) 08/30/2018 1053   HGB 11.5 09/13/2018 1528   HCT 32.9 (L) 09/13/2018 1528  PLT 244 09/13/2018 1528   MCV 98 (H) 09/13/2018 1528   MCH 34.2 (H) 09/13/2018 1528   MCH 32.7 08/30/2018 1053   MCHC 35.0 09/13/2018 1528   MCHC 31.7 08/30/2018 1053   RDW 12.8 09/13/2018 1528   LYMPHSABS 2.2 09/13/2018 1528   MONOABS 0.7 08/28/2018 0315   EOSABS 0.2  09/13/2018 1528   BASOSABS 0.1 09/13/2018 1528    Lab Results  Component Value Date   HGBA1C 4.3 (L) 08/26/2018    Assessment & Plan:   1. Lumbar radiculopathy Uncontrolled Continue meloxicam, Lyrica, Flexeril - Ambulatory referral to Physical Therapy  2. Prolonged Q-T interval on ECG Prolonged QTc interval which is increased from 476 to 493 in the last 1 month This could be attributed to trazodone I have provided her a copy of her EKG and she will need to speak with her psychiatrist about a possible medication change.  No orders of the defined types were placed in this encounter.   Follow-up: Return for meidcal conditions keep previous appt.Charlott Rakes, MD, FAAFP. [facility] [place], [place]   02/23/2019, 11:51 AM

## 2019-02-23 NOTE — Progress Notes (Signed)
Patient is having pain in back and legs.

## 2019-02-23 NOTE — Patient Instructions (Signed)
Back Pain in Pregnancy °Back pain during pregnancy is common. Back pain may be caused by several factors that are related to changes during your pregnancy. °Follow these instructions at home: °Managing pain, stiffness, and swelling ° °  ° °· If directed, for sudden (acute) back pain, put ice on the painful area. °? Put ice in a plastic bag. °? Place a towel between your skin and the bag. °? Leave the ice on for 20 minutes, 2-3 times per day. °· If directed, apply heat to the affected area before you exercise. Use the heat source that your health care provider recommends, such as a moist heat pack or a heating pad. °? Place a towel between your skin and the heat source. °? Leave the heat on for 20-30 minutes. °? Remove the heat if your skin turns bright red. This is especially important if you are unable to feel pain, heat, or cold. You may have a greater risk of getting burned. °· If directed, massage the affected area. °Activity °· Exercise as told by your health care provider. Gentle exercise is the best way to prevent or manage back pain. °· Listen to your body when lifting. If lifting hurts, ask for help or bend your knees. This uses your leg muscles instead of your back muscles. °· Squat down when picking up something from the floor. Do not bend over. °· Only use bed rest for short periods as told by your health care provider. Bed rest should only be used for the most severe episodes of back pain. °Standing, sitting, and lying down °· Do not stand in one place for long periods of time. °· Use good posture when sitting. Make sure your head rests over your shoulders and is not hanging forward. Use a pillow on your lower back if necessary. °· Try sleeping on your side, preferably the left side, with a pregnancy support pillow or 1-2 regular pillows between your legs. °? If you have back pain after a night's rest, your bed may be too soft. °? A firm mattress may provide more support for your back during  pregnancy. °General instructions °· Do not wear high heels. °· Eat a healthy diet. Try to gain weight within your health care provider's recommendations. °· Use a maternity girdle, elastic sling, or back brace as told by your health care provider. °· Take over-the-counter and prescription medicines only as told by your health care provider. °· Work with a physical therapist or massage therapist to find ways to manage back pain. Acupuncture or massage therapy may be helpful. °· Keep all follow-up visits as told by your health care provider. This is important. °Contact a health care provider if: °· Your back pain interferes with your daily activities. °· You have increasing pain in other parts of your body. °Get help right away if: °· You develop numbness, tingling, weakness, or problems with the use of your arms or legs. °· You develop severe back pain that is not controlled with medicine. °· You have a change in bowel or bladder control. °· You develop shortness of breath, dizziness, or you faint. °· You develop nausea, vomiting, or sweating. °· You have back pain that is a rhythmic, cramping pain similar to labor pains. Labor pain is usually 1-2 minutes apart, lasts for about 1 minute, and involves a bearing down feeling or pressure in your pelvis. °· You have back pain and your water breaks or you have vaginal bleeding. °· You have back pain or numbness   that travels down your leg. °· Your back pain developed after you fell. °· You develop pain on one side of your back. °· You see blood in your urine. °· You develop skin blisters in the area of your back pain. °Summary °· Back pain may be caused by several factors that are related to changes during your pregnancy. °· Follow instructions as told by your health care provider for managing pain, stiffness, and swelling. °· Exercise as told by your health care provider. Gentle exercise is the best way to prevent or manage back pain. °· Take over-the-counter and  prescription medicines only as told by your health care provider. °· Keep all follow-up visits as told by your health care provider. This is important. °This information is not intended to replace advice given to you by your health care provider. Make sure you discuss any questions you have with your health care provider. °Document Released: 11/18/2005 Document Revised: 11/29/2018 Document Reviewed: 01/26/2018 °Elsevier Patient Education © 2020 [employer] ° °

## 2019-02-28 ENCOUNTER — Ambulatory Visit: Payer: BLUE CROSS/BLUE SHIELD | Admitting: Orthopaedic Surgery

## 2019-03-14 ENCOUNTER — Ambulatory Visit: Payer: BLUE CROSS/BLUE SHIELD | Admitting: Physician Assistant

## 2019-03-14 ENCOUNTER — Telehealth: Payer: Self-pay | Admitting: Physician Assistant

## 2019-03-14 NOTE — Telephone Encounter (Signed)
LVM, reminding pt of her appt for 03-14-19 with Almyra Deforest.

## 2019-03-14 NOTE — Progress Notes (Deleted)
Cardiology Clinic Note   Patient Name: Amanda FillersDonna A Groninger Date of Encounter: 03/14/2019  Primary Care Provider:  Hoy RegisterNewlin, Enobong, MD Primary Cardiologist:  Nicki Guadalajarahomas Kelly, MD  Patient Profile    ***  Past Medical History    Past Medical History:  Diagnosis Date  . Anxiety   . Arthritis    lower back  . Asthma   . Bipolar disorder (HCC)   . Carpal tunnel syndrome, bilateral   . Chronic back pain   . Depression   . Diverticulosis   . Fx. left wrist   . GERD (gastroesophageal reflux disease)   . Heart murmur    "born with"  . Heart murmur   . HLD (hyperlipidemia)   . Hypertension   . IBS (irritable bowel syndrome)   . Internal hemorrhoids   . Muscle spasms of neck   . Neuropathy   . PUD (peptic ulcer disease)    Past Surgical History:  Procedure Laterality Date  . ARTHRODESIS METATARSALPHALANGEAL JOINT (MTPJ) Right 04/29/2016   Procedure: RIGHT GREAT TOE METATARSOPHALANGEAL JOINT (MTPJ) FUSION, HARDWARE REMOVAL;  Surgeon: Tarry KosNaiping M Xu, MD;  Location: [facility];  Service: Orthopedics;  Laterality: Right;  RIGHT GREAT TOE METATARSOPHALANGEAL JOINT (MTPJ) FUSION, HARDWARE REMOVAL  . CARPAL TUNNEL RELEASE Right 2003  . CHOLECYSTECTOMY N/A 02/09/2014   Procedure: LAPAROSCOPIC CHOLECYSTECTOMY WITH INTRAOPERATIVE CHOLANGIOGRAM;  Surgeon: Valarie MerinoMatthew B Martin, MD;  Location: [facility];  Service: General;  Laterality: N/A;  . CHOLECYSTECTOMY    . FOOT SURGERY    . HAMMER TOE FUSION Bilateral 2008  . HARDWARE REMOVAL Right 04/29/2016   Procedure: HARDWARE REMOVAL;  Surgeon: Tarry KosNaiping M Xu, MD;  Location: [facility];  Service: Orthopedics;  Laterality: Right;  HARDWARE REMOVAL  . LEFT HEART CATH AND CORONARY ANGIOGRAPHY N/A 08/30/2018   Procedure: LEFT HEART CATH AND CORONARY ANGIOGRAPHY;  Surgeon: Marykay LexHarding, David W, MD;  [facility];  Service: Cardiovascular;  Laterality: N/A;  . ROTATOR CUFF REPAIR Right 2011  . WRIST FRACTURE SURGERY Left      Allergies  Allergies  Allergen Reactions  . Penicillins Anaphylaxis    Has patient had a PCN reaction causing immediate rash, facial/tongue/throat swelling, SOB or lightheadedness with hypotension: Yes Has patient had a PCN reaction causing severe rash involving mucus membranes or skin necrosis: Yes Has patient had a PCN reaction that required hospitalization Yes Has patient had a PCN reaction occurring within the last 10 years: No-more than 10 years ago If all of the above answers are "NO", then may proceed with Cephalosporin use.   Marland Kitchen. Penicillins Anaphylaxis    DID THE REACTION INVOLVE: Swelling of the face/tongue/throat, SOB, or low BP? Yes Sudden or severe rash/hives, skin peeling, or the inside of the mouth or nose? Yes Did it require medical treatment? No When did it last happen? Within the past 10 years If all above answers are "NO", may proceed with cephalosporin use.    Jonne Ply. Asa [Aspirin] Rash  . Aspirin Rash    History of Present Illness    ***  Home Medications    Prior to Admission medications   Medication Sig Start Date End Date Taking? Authorizing Provider  albuterol (VENTOLIN HFA) 108 (90 Base) MCG/ACT inhaler INHALE 2 PUFFS INTO THE LUNGS EVERY 6 HOURS AS NEEDED FOR WHEEZING OR SHORTNESS OF BREATH. 01/30/19   Hoy RegisterNewlin, Enobong, MD  amLODipine (NORVASC) 5 MG tablet Take 1 tablet (5 mg total) by mouth daily. 01/30/19   Newlin,  Enobong, MD  aspirin EC 81 MG tablet Take 1 tablet (81 mg total) by mouth daily. Patient not taking: Reported on 01/30/2019 08/30/18 08/30/19  Rolly SalterPatel, Pranav M, MD  atorvastatin (LIPITOR) 40 MG tablet Take 1 tablet (40 mg total) by mouth daily. 09/21/18   Azalee CourseMeng, Hao, PA  buPROPion (WELLBUTRIN SR) 150 MG 12 hr tablet Take 150 mg by mouth 2 (two) times daily.    [provider]  cetirizine (ZYRTEC) 10 MG tablet Take 1 tablet (10 mg total) by mouth daily. 01/30/19   Hoy RegisterNewlin, Enobong, MD  cloNIDine (CATAPRES) 0.1 MG tablet Take 1 tablet (0.1 mg total) by  mouth at bedtime as needed. For hot flashes 10/25/18   Hoy RegisterNewlin, Enobong, MD  colestipol (COLESTID) 1 g tablet Take 1 tablet (1 g total) by mouth every morning. 09/24/16   Pyrtle, Carie CaddyJay M, MD  cyclobenzaprine (FLEXERIL) 10 MG tablet Take 1 tablet (10 mg total) by mouth 2 (two) times daily. prn 01/30/19   Hoy RegisterNewlin, Enobong, MD  dexlansoprazole (DEXILANT) 60 MG capsule TAKE 1 CAPSULE (60 MG TOTAL) BY MOUTH DAILY. 01/30/19   Hoy RegisterNewlin, Enobong, MD  divalproex (DEPAKOTE) 500 MG DR tablet Take 500 mg by mouth 3 (three) times daily.    [provider]  ergocalciferol (DRISDOL) 1.25 MG (50000 UT) capsule Take 1 capsule (50,000 Units total) by mouth once a week. 07/26/18   Hoy RegisterNewlin, Enobong, MD  fluticasone (FLONASE) 50 MCG/ACT nasal spray PLACE 2 SPRAYS INTO BOTH NOSTRILS DAILY. 05/19/18   Hoy RegisterNewlin, Enobong, MD  haloperidol (HALDOL) 5 MG tablet Take 1 tablet by mouth daily. 02/13/16   [provider]  hydrochlorothiazide (HYDRODIURIL) 25 MG tablet Take 1 tablet (25 mg total) by mouth daily. 01/30/19   Hoy RegisterNewlin, Enobong, MD  hydrOXYzine (ATARAX/VISTARIL) 50 MG tablet Take 1 tablet (50 mg total) by mouth 3 (three) times daily as needed for anxiety or itching. 10/23/15   Hoy RegisterNewlin, Enobong, MD  isosorbide mononitrate (IMDUR) 30 MG 24 hr tablet Take 1 tablet (30 mg total) by mouth daily. 10/04/18   Lennette BihariKelly, Thomas A, MD  lidocaine (LIDODERM) 5 % Place 1 patch onto the skin daily. Remove & Discard patch within 12 hours or as directed by MD Patient not taking: Reported on 01/30/2019 10/25/18   Hoy RegisterNewlin, Enobong, MD  meloxicam (MOBIC) 15 MG tablet Take 1 tablet (15 mg total) by mouth daily. 01/30/19   Hoy RegisterNewlin, Enobong, MD  metoprolol tartrate (LOPRESSOR) 100 MG tablet Take 1 tablet (100 mg total) by mouth 2 (two) times daily. 01/30/19   Hoy RegisterNewlin, Enobong, MD  olopatadine (PATANOL) 0.1 % ophthalmic solution PLACE 1 DROP INTO BOTH EYES 2 TIMES DAILY. 10/18/18   Hoy RegisterNewlin, Enobong, MD  pregabalin (LYRICA) 75 MG capsule Take 1 capsule (75 mg total) by  mouth 2 (two) times daily. 10/20/18   Hoy RegisterNewlin, Enobong, MD  SUMAtriptan (IMITREX) 50 MG tablet TAKE 1 TABLET BY MOUTH AT THE ONSET OF A MIGRAINE. MAY REPEAT IN 2 HOURS IF HEADACHE PERSISTS OR RECURS. MAXIMUM DAILY DOSE 200 MG 01/30/19   Hoy RegisterNewlin, Enobong, MD  topiramate (TOPAMAX) 100 MG tablet Take 200 mg by mouth 2 (two) times daily.    [provider]  traZODone (DESYREL) 300 MG tablet Take 1 tablet (300 mg total) by mouth at bedtime. 11/21/14   Adonis BrookAgustin, Sheila, NP    Family History    Family History  Problem Relation Age of Onset  . Pancreatic cancer Mother   . CAD Father   . Diabetes Mellitus II Father   .  Heart failure Father   . Diabetes Father   . Breast cancer Paternal Grandmother   . HIV/AIDS Brother   . Other Sister        Brain tumor  . Colon cancer Neg Hx    She indicated that her mother is deceased. She indicated that her father is deceased. She indicated that the status of her sister is unknown. She indicated that the status of her brother is unknown. She indicated that her paternal grandmother is deceased. She indicated that the status of her neg hx is unknown.  Social History    Social History   Socioeconomic History  . Marital status: Single    Spouse name: Not on file  . Number of children: 0  . Years of education: 212  . Highest education level: Not on file  Occupational History  . Occupation: [employer]    Comment: [employer]  Social Needs  . Financial resource strain: Not on file  . Food insecurity    Worry: Not on file    Inability: Not on file  . Transportation needs    Medical: Not on file    Non-medical: Not on file  Tobacco Use  . Smoking status: Light Tobacco Smoker    Types: Cigarettes  . Smokeless tobacco: Never Used  . Tobacco comment: "takes a puff sometimes"  Substance and Sexual Activity  . Alcohol use: Not Currently    Comment: Last drink 6 months ago.  . Drug use: Yes    Types: Cocaine, "Crack" cocaine    Comment: last used two years ago   . Sexual activity: Yes    Partners: Female  Lifestyle  . Physical activity    Days per week: Not on file    Minutes per session: Not on file  . Stress: Not on file  Relationships  . Social Musicianconnections    Talks on phone: Not on file    Gets together: Not on file    Attends religious service: Not on file    Active member of club or organization: Not on file    Attends meetings of clubs or organizations: Not on file    Relationship status: Not on file  . Intimate partner violence    Fear of current or ex partner: Not on file    Emotionally abused: Not on file    Physically abused: Not on file    Forced sexual activity: Not on file  Other Topics Concern  . Not on file  Social History Narrative   ** Merged History Encounter **       Pt lives at home alone. She has a HS education level and does not have any children.  She drinks 2 cups of caffeine daily.     Review of Systems    General:  No chills, fever, night sweats or weight changes.  Cardiovascular:  No chest pain, dyspnea on exertion, edema, orthopnea, palpitations, paroxysmal nocturnal dyspnea. Dermatological: No rash, lesions/masses Respiratory: No cough, dyspnea Urologic: No hematuria, dysuria Abdominal:   No nausea, vomiting, diarrhea, bright red blood per rectum, melena, or hematemesis Neurologic:  No visual changes, wkns, changes in mental status. All other systems reviewed and are otherwise negative except as noted above.  Physical Exam    VS:  There were no vitals taken for this visit. , BMI There is no height or weight on file to calculate BMI. GEN: Well nourished, well developed, in no acute distress. HEENT: normal. Neck: Supple, no JVD, carotid bruits, or masses.  Cardiac: RRR, no murmurs, rubs, or gallops. No clubbing, cyanosis, edema.  Radials/DP/PT 2+ and equal bilaterally.  Respiratory:  Respirations regular and unlabored, clear to auscultation bilaterally. GI: Soft, nontender, nondistended, BS + x 4.  MS: no deformity or atrophy. Skin: warm and dry, no rash. Neuro:  Strength and sensation are intact. Psych: Normal affect.  Accessory Clinical Findings    ECG personally reviewed by me today- *** - No acute changes  Assessment & Plan   1.  ***  Deberah Pelton, NP 03/14/2019, 1:15 PM

## 2019-04-03 ENCOUNTER — Other Ambulatory Visit: Payer: Self-pay | Admitting: Family Medicine

## 2019-04-03 MED FILL — ATORVASTATIN CALCIUM 40 MG: 40 | 90 days supply | Qty: 90 | Fill #2

## 2019-04-03 MED FILL — MELOXICAM 15 MG TABLET: 15 | 90 days supply | Qty: 90 | Fill #1

## 2019-04-03 MED FILL — HYDROCHLOROTHIAZIDE 25 MG T: 25 | 90 days supply | Qty: 90 | Fill #1

## 2019-04-03 MED FILL — traZODone HCL 100 MG TABS: 100 | 90 days supply | Qty: 270 | Fill #0

## 2019-04-03 MED FILL — DIVALPROEX SOD DR 500 MG TA: 500 | 90 days supply | Qty: 270 | Fill #0

## 2019-04-03 MED FILL — SUMATRIPTAN SUCC 50 MG TAB: 50 | 30 days supply | Qty: 9 | Fill #2

## 2019-04-03 MED FILL — BUPROPION SR 150 MG TABLET: 150 | 90 days supply | Qty: 180 | Fill #0

## 2019-04-03 MED FILL — GABAPENTIN 800 MG TABLET: 800 | 90 days supply | Qty: 360 | Fill #0

## 2019-04-03 MED FILL — HALOPERIDOL 5 MG TABS: 5 | 90 days supply | Qty: 135 | Fill #0

## 2019-04-03 MED FILL — DEXILANT DR 60 MG CAPSULE: 60 | 30 days supply | Qty: 30 | Fill #0

## 2019-04-03 MED FILL — METOPROLOL TARTRATE 100 MG: 100 | 90 days supply | Qty: 180 | Fill #0

## 2019-04-03 MED FILL — VENTOLIN HFA 90 MCG INHALER: 108 (90 BAS | 25 days supply | Qty: 18 | Fill #2

## 2019-04-03 MED FILL — cloNIDine HCL 0.1 MG TABS: 0.1 | 30 days supply | Qty: 30 | Fill #1

## 2019-04-03 MED FILL — CYCLOBENZAPRINE 10 MG TAB: 10 | 30 days supply | Qty: 60 | Fill #1

## 2019-04-05 MED FILL — TOPIRAMATE 100 MG TABLET: 100 | 30 days supply | Qty: 120 | Fill #0

## 2019-04-06 MED FILL — VIT D2 1.25 MG (50,000 UNIT: 1.25 MG | 84 days supply | Qty: 12 | Fill #0

## 2019-04-10 MED FILL — AMLODIPINE BESYLATE 5 MG TA: 5 | 30 days supply | Qty: 30 | Fill #1

## 2019-04-24 MED FILL — ISOSORBIDE MN ER 30 MG TAB: 30 | 90 days supply | Qty: 90 | Fill #2

## 2019-05-02 ENCOUNTER — Ambulatory Visit: Payer: BLUE CROSS/BLUE SHIELD | Attending: Family Medicine | Admitting: Family Medicine

## 2019-05-02 ENCOUNTER — Encounter: Payer: Self-pay | Admitting: Family Medicine

## 2019-05-02 ENCOUNTER — Other Ambulatory Visit: Payer: Self-pay

## 2019-05-02 VITALS — BP 109/73 | HR 67 | Temp 98.5°F | Ht 67.0 in | Wt 214.4 lb

## 2019-05-02 DIAGNOSIS — G5603 Carpal tunnel syndrome, bilateral upper limbs: Secondary | ICD-10-CM | POA: Diagnosis not present

## 2019-05-02 DIAGNOSIS — J328 Other chronic sinusitis: Secondary | ICD-10-CM | POA: Diagnosis not present

## 2019-05-02 DIAGNOSIS — M5416 Radiculopathy, lumbar region: Secondary | ICD-10-CM | POA: Diagnosis not present

## 2019-05-02 DIAGNOSIS — K219 Gastro-esophageal reflux disease without esophagitis: Secondary | ICD-10-CM

## 2019-05-02 DIAGNOSIS — Z760 Encounter for issue of repeat prescription: Secondary | ICD-10-CM

## 2019-05-02 DIAGNOSIS — I10 Essential (primary) hypertension: Secondary | ICD-10-CM

## 2019-05-02 DIAGNOSIS — G43709 Chronic migraine without aura, not intractable, without status migrainosus: Secondary | ICD-10-CM

## 2019-05-02 MED ORDER — CETIRIZINE HCL 10 MG PO TABS: 10.0000 mg | ORAL_TABLET | Freq: Every day | ORAL | 5 refills | Status: DC

## 2019-05-02 MED ORDER — AMLODIPINE BESYLATE 5 MG PO TABS: 5.0000 mg | ORAL_TABLET | Freq: Every day | ORAL | 6 refills | Status: AC

## 2019-05-02 MED ORDER — PREGABALIN 75 MG PO CAPS: 75.0000 mg | ORAL_CAPSULE | Freq: Two times a day (BID) | ORAL | 3 refills | Status: DC

## 2019-05-02 MED ORDER — MELOXICAM 15 MG PO TABS: 15.0000 mg | ORAL_TABLET | Freq: Every day | ORAL | 6 refills | Status: DC

## 2019-05-02 MED ORDER — HYDROCHLOROTHIAZIDE 25 MG PO TABS: 25.0000 mg | ORAL_TABLET | Freq: Every day | ORAL | 6 refills | Status: DC

## 2019-05-02 MED ORDER — TOPIRAMATE 100 MG PO TABS: ORAL_TABLET | ORAL | 6 refills | Status: DC

## 2019-05-02 MED ORDER — PREGABALIN 75 MG PO CAPS: 75.0000 mg | ORAL_CAPSULE | Freq: Two times a day (BID) | ORAL | 1 refills | Status: DC

## 2019-05-02 MED ORDER — CYCLOBENZAPRINE HCL 10 MG PO TABS: 10.0000 mg | ORAL_TABLET | Freq: Two times a day (BID) | ORAL | 6 refills | Status: DC

## 2019-05-02 MED ORDER — DEXILANT 60 MG PO CPDR: DELAYED_RELEASE_CAPSULE | ORAL | 6 refills | Status: DC

## 2019-05-02 MED ORDER — METOPROLOL TARTRATE 100 MG PO TABS: 100.0000 mg | ORAL_TABLET | Freq: Two times a day (BID) | ORAL | 6 refills | Status: DC

## 2019-05-02 MED FILL — TOPIRAMATE 100 MG TABS: 100 | 30 days supply | Qty: 120 | Fill #0

## 2019-05-02 MED FILL — CYCLOBENZAPRINE 10 MG TAB: 10 | 30 days supply | Qty: 60 | Fill #0

## 2019-05-02 MED FILL — DEXILANT DR 60 MG CAPSULE: 60 | 30 days supply | Qty: 30 | Fill #0

## 2019-05-02 MED FILL — hydrOXYzine HCL 50 MG TABS: 50 | 30 days supply | Qty: 90 | Fill #0

## 2019-05-02 NOTE — Progress Notes (Signed)
Subjective:  Patient ID: Amanda [name], female    DOB: 01-07-1966  Age: 53 y.o. MRN: [id number]  CC: Hypertension   HPI Amanda [name] is a 53 year old female with a history of hypertension, bipolar disorder, neuropathy, chronic low back pain, PUD/GERD, Migraine, carpal tunnel syndrome, PEA arrest in the setting of cocaine abuse in 08/2018 who comes in today for a follow-up Visit. She complains of persisting low back pain in her lumbar region bilaterally and is currently being evaluated by the disability physician.  I had referred her to PT but she never made it there as [place]-minute walk from the bus stop.  Pain is moderate to severe and radiates down her lower extremities bilaterally; no loss of sphincteric function.  She has associated tingling in her legs and her feet.  Currently on Lyrica, Cymbalta and a muscle relaxant. Her carpal tunnel syndrome is also acting up with associated tingling and numbness in her hands and wrist pain.  Refer to Ortho care and notes reveal attempt by orthopedics to return failed.  She informs me she had changed her phone number. Her migraine is controlled, she is compliant with her antihypertensive and informs me she has quit cocaine use.  Past Medical History:  Diagnosis Date  . Anxiety   . Arthritis    lower back  . Asthma   . Bipolar disorder (HCC)   . Carpal tunnel syndrome, bilateral   . Chronic back pain   . Depression   . Diverticulosis   . Fx. left wrist   . GERD (gastroesophageal reflux disease)   . Heart murmur    "born with"  . Heart murmur   . HLD (hyperlipidemia)   . Hypertension   . IBS (irritable bowel syndrome)   . Internal hemorrhoids   . Muscle spasms of neck   . Neuropathy   . PUD (peptic ulcer disease)     Past Surgical History:  Procedure Laterality Date  . ARTHRODESIS METATARSALPHALANGEAL JOINT (MTPJ) Right 04/29/2016   Procedure: RIGHT GREAT TOE METATARSOPHALANGEAL JOINT (MTPJ) FUSION, HARDWARE REMOVAL;  Surgeon:  Tarry Kos, MD;  Location: [facility];  Service: Orthopedics;  Laterality: Right;  RIGHT GREAT TOE METATARSOPHALANGEAL JOINT (MTPJ) FUSION, HARDWARE REMOVAL  . CARPAL TUNNEL RELEASE Right 2003  . CHOLECYSTECTOMY N/A 02/09/2014   Procedure: LAPAROSCOPIC CHOLECYSTECTOMY WITH INTRAOPERATIVE CHOLANGIOGRAM;  Surgeon: Valarie Merino, MD;  Location: [facility];  Service: General;  Laterality: N/A;  . CHOLECYSTECTOMY    . FOOT SURGERY    . HAMMER TOE FUSION Bilateral 2008  . HARDWARE REMOVAL Right 04/29/2016   Procedure: HARDWARE REMOVAL;  Surgeon: Tarry Kos, MD;  Location: [facility];  Service: Orthopedics;  Laterality: Right;  HARDWARE REMOVAL  . LEFT HEART CATH AND CORONARY ANGIOGRAPHY N/A 08/30/2018   Procedure: LEFT HEART CATH AND CORONARY ANGIOGRAPHY;  Surgeon: Marykay Lex, MD;  Location: [facility];  Service: Cardiovascular;  Laterality: N/A;  . ROTATOR CUFF REPAIR Right 2011  . WRIST FRACTURE SURGERY Left     Family History  Problem Relation Age of Onset  . Pancreatic cancer Mother   . CAD Father   . Diabetes Mellitus II Father   . Heart failure Father   . Diabetes Father   . Breast cancer Paternal Grandmother   . HIV/AIDS Brother   . Other Sister        Brain tumor  . Colon cancer Neg Hx  Allergies  Allergen Reactions  . Penicillins Anaphylaxis    Has patient had a PCN reaction causing immediate rash, facial/tongue/throat swelling, SOB or lightheadedness with hypotension: Yes Has patient had a PCN reaction causing severe rash involving mucus membranes or skin necrosis: Yes Has patient had a PCN reaction that required hospitalization Yes Has patient had a PCN reaction occurring within the last 10 years: No-more than 10 years ago If all of the above answers are "NO", then may proceed with Cephalosporin use.   Marland Kitchen Penicillins Anaphylaxis    DID THE REACTION INVOLVE: Swelling of the face/tongue/throat, SOB, or low BP? Yes Sudden or severe  rash/hives, skin peeling, or the inside of the mouth or nose? Yes Did it require medical treatment? No When did it last happen? Within the past 10 years If all above answers are "NO", may proceed with cephalosporin use.    Diona Fanti [Aspirin] Rash  . Aspirin Rash    Outpatient Medications Prior to Visit  Medication Sig Dispense Refill  . albuterol (VENTOLIN HFA) 108 (90 Base) MCG/ACT inhaler INHALE 2 PUFFS INTO THE LUNGS EVERY 6 HOURS AS NEEDED FOR WHEEZING OR SHORTNESS OF BREATH. 18 g 3  . atorvastatin (LIPITOR) 40 MG tablet Take 1 tablet (40 mg total) by mouth daily. 30 tablet 6  . buPROPion (WELLBUTRIN SR) 150 MG 12 hr tablet Take 150 mg by mouth 2 (two) times daily.    . cloNIDine (CATAPRES) 0.1 MG tablet Take 1 tablet (0.1 mg total) by mouth at bedtime as needed. For hot flashes 30 tablet 3  . colestipol (COLESTID) 1 g tablet Take 1 tablet (1 g total) by mouth every morning. 30 tablet 4  . divalproex (DEPAKOTE) 500 MG DR tablet Take 500 mg by mouth 3 (three) times daily.    . fluticasone (FLONASE) 50 MCG/ACT nasal spray PLACE 2 SPRAYS INTO BOTH NOSTRILS DAILY. 16 g 1  . haloperidol (HALDOL) 5 MG tablet Take 1 tablet by mouth daily.  0  . isosorbide mononitrate (IMDUR) 30 MG 24 hr tablet Take 1 tablet (30 mg total) by mouth daily. 90 tablet 3  . olopatadine (PATANOL) 0.1 % ophthalmic solution PLACE 1 DROP INTO BOTH EYES 2 TIMES DAILY. 5 mL 2  . SUMAtriptan (IMITREX) 50 MG tablet TAKE 1 TABLET BY MOUTH AT THE ONSET OF A MIGRAINE. MAY REPEAT IN 2 HOURS IF HEADACHE PERSISTS OR RECURS. MAXIMUM DAILY DOSE 200 MG 10 tablet 2  . traZODone (DESYREL) 300 MG tablet Take 1 tablet (300 mg total) by mouth at bedtime. 30 tablet 0  . Vitamin D, Ergocalciferol, (DRISDOL) 1.25 MG (50000 UT) CAPS capsule TAKE 1 CAPSULE (50,000 UNITS TOTAL) BY MOUTH ONCE A WEEK. 9 capsule 1  . amLODipine (NORVASC) 5 MG tablet Take 1 tablet (5 mg total) by mouth daily. 30 tablet 3  . cetirizine (ZYRTEC) 10 MG tablet Take 1  tablet (10 mg total) by mouth daily. 30 tablet 5  . cyclobenzaprine (FLEXERIL) 10 MG tablet Take 1 tablet (10 mg total) by mouth 2 (two) times daily. prn 60 tablet 3  . dexlansoprazole (DEXILANT) 60 MG capsule TAKE 1 CAPSULE (60 MG TOTAL) BY MOUTH DAILY. 30 capsule 3  . hydrochlorothiazide (HYDRODIURIL) 25 MG tablet Take 1 tablet (25 mg total) by mouth daily. 30 tablet 3  . hydrOXYzine (ATARAX/VISTARIL) 50 MG tablet Take 1 tablet (50 mg total) by mouth 3 (three) times daily as needed for anxiety or itching. 90 tablet 2  . meloxicam (MOBIC) 15 MG tablet Take  1 tablet (15 mg total) by mouth daily. 30 tablet 3  . metoprolol tartrate (LOPRESSOR) 100 MG tablet Take 1 tablet (100 mg total) by mouth 2 (two) times daily. 60 tablet 3  . pregabalin (LYRICA) 75 MG capsule Take 1 capsule (75 mg total) by mouth 2 (two) times daily. 180 capsule 3  . topiramate (TOPAMAX) 100 MG tablet TAKE 2 TABLETS (200 MG TOTAL) BY MOUTH 2 (TWO) TIMES DAILY. 120 tablet 3  . aspirin EC 81 MG tablet Take 1 tablet (81 mg total) by mouth daily. (Patient not taking: Reported on 01/30/2019) 150 tablet 0  . lidocaine (LIDODERM) 5 % Place 1 patch onto the skin daily. Remove & Discard patch within 12 hours or as directed by MD (Patient not taking: Reported on 01/30/2019) 30 patch 3   Facility-Administered Medications Prior to Visit  Medication Dose Route Frequency Provider Last Rate Last Dose  . lidocaine (PF) (XYLOCAINE) 1 % injection 0.3 mL  0.3 mL Other Once Tyrell AntonioNewton, Frederic, MD         ROS Review of Systems  Constitutional: Negative for activity change, appetite change and fatigue.  HENT: Negative for congestion, sinus pressure and sore throat.   Eyes: Negative for visual disturbance.  Respiratory: Negative for cough, chest tightness, shortness of breath and wheezing.   Cardiovascular: Negative for chest pain and palpitations.  Gastrointestinal: Negative for abdominal distention, abdominal pain and constipation.  Endocrine:  Negative for polydipsia.  Genitourinary: Negative for dysuria and frequency.  Musculoskeletal: Positive for back pain. Negative for arthralgias.  Skin: Negative for rash.  Neurological: Positive for numbness. Negative for tremors and light-headedness.  Hematological: Does not bruise/bleed easily.  Psychiatric/Behavioral: Negative for agitation and behavioral problems.    Objective:  BP 109/73   Pulse 67   Temp 98.5 F (36.9 C) (Oral)   Ht 5\' 7"  (1.702 m)   Wt 214 lb 6.4 oz (97.3 kg)   SpO2 99%   BMI 33.58 kg/m   BP/Weight 05/02/2019 02/23/2019 01/30/2019  Systolic BP 109 95 125  Diastolic BP 73 65 84  Wt. (Lbs) 214.4 218 219  BMI 33.58 34.14 34.3  Some encounter information is confidential and restricted. Go to Review Flowsheets activity to see all data.      Physical Exam Constitutional:      Appearance: She is well-developed.  Cardiovascular:     Rate and Rhythm: Normal rate.     Heart sounds: Normal heart sounds. No murmur.  Pulmonary:     Effort: Pulmonary effort is normal.     Breath sounds: Normal breath sounds. No wheezing or rales.  Chest:     Chest wall: No tenderness.  Abdominal:     General: Bowel sounds are normal. There is no distension.     Palpations: Abdomen is soft. There is no mass.     Tenderness: There is no abdominal tenderness.  Musculoskeletal: Normal range of motion.     Comments: Negative straight leg raise bilaterally  Neurological:     Mental Status: She is alert and oriented to person, place, and time.     Sensory: Sensory deficit (dysesthesia in legs b/l) present.  Psychiatric:        Mood and Affect: Mood normal.     CMP Latest Ref Rng & Units 09/13/2018 08/30/2018 08/30/2018  Glucose 65 - 99 mg/dL 90 - 97  BUN 6 - 24 mg/dL 8 - 16  Creatinine 1.610.57 - 1.00 mg/dL 0.96(E1.12(H) 4.54(U1.24(H) 9.81(X1.31(H)  Sodium 134 - 144 mmol/L 140 -  137  Potassium 3.5 - 5.2 mmol/L 4.4 - 4.2  Chloride 96 - 106 mmol/L 107(H) - 110  CO2 20 - 29 mmol/L 19(L) - 20(L)  Calcium  8.7 - 10.2 mg/dL 9.3 - 8.7(L)  Total Protein 6.0 - 8.5 g/dL 6.7 - -  Total Bilirubin 0.0 - 1.2 mg/dL <4.0 - -  Alkaline Phos 39 - 117 IU/L 138(H) - -  AST 0 - 40 IU/L 13 - -  ALT 0 - 32 IU/L 17 - -    Lipid Panel     Component Value Date/Time   CHOL 205 (H) 09/30/2017 1104   TRIG 161 (H) 09/30/2017 1104   HDL 52 09/30/2017 1104   CHOLHDL 3.9 09/30/2017 1104   CHOLHDL 2.9 11/27/2015 1038   VLDL 9 11/27/2015 1038   LDLCALC 121 (H) 09/30/2017 1104    CBC    Component Value Date/Time   WBC 6.7 09/13/2018 1528   WBC 6.0 08/30/2018 1053   RBC 3.36 (L) 09/13/2018 1528   RBC 3.30 (L) 08/30/2018 1053   HGB 11.5 09/13/2018 1528   HCT 32.9 (L) 09/13/2018 1528   PLT 244 09/13/2018 1528   MCV 98 (H) 09/13/2018 1528   MCH 34.2 (H) 09/13/2018 1528   MCH 32.7 08/30/2018 1053   MCHC 35.0 09/13/2018 1528   MCHC 31.7 08/30/2018 1053   RDW 12.8 09/13/2018 1528   LYMPHSABS 2.2 09/13/2018 1528   MONOABS 0.7 08/28/2018 0315   EOSABS 0.2 09/13/2018 1528   BASOSABS 0.1 09/13/2018 1528    Lab Results  Component Value Date   HGBA1C 4.3 (L) 08/26/2018    Assessment & Plan:   1. Lumbar radiculopathy Uncontrolled She will benefit for PT but due to transportation issues she has been unable to make it there - pregabalin (LYRICA) 75 MG capsule; Take 1 capsule (75 mg total) by mouth 2 (two) times daily.  Dispense: 180 capsule; Refill: 3 - cyclobenzaprine (FLEXERIL) 10 MG tablet; Take 1 tablet (10 mg total) by mouth 2 (two) times daily. prn  Dispense: 60 tablet; Refill: 6  2. Essential hypertension Controlled Counseled on blood pressure goal of less than 130/80, low-sodium, DASH diet, medication compliance, 150 minutes of moderate intensity exercise per week. Discussed medication compliance, adverse effects. - amLODipine (NORVASC) 5 MG tablet; Take 1 tablet (5 mg total) by mouth daily.  Dispense: 30 tablet; Refill: 6 - hydrochlorothiazide (HYDRODIURIL) 25 MG tablet; Take 1 tablet (25 mg  total) by mouth daily.  Dispense: 30 tablet; Refill: 6 - metoprolol tartrate (LOPRESSOR) 100 MG tablet; Take 1 tablet (100 mg total) by mouth 2 (two) times daily.  Dispense: 60 tablet; Refill: 6 - Basic Metabolic Panel  3. Bilateral carpal tunnel syndrome Uncontrolled Referred to [facility] who was unable to reach her to schedule - meloxicam (MOBIC) 15 MG tablet; Take 1 tablet (15 mg total) by mouth daily.  Dispense: 30 tablet; Refill: 6  4. Other chronic sinusitis Stable - cetirizine (ZYRTEC) 10 MG tablet; Take 1 tablet (10 mg total) by mouth daily.  Dispense: 30 tablet; Refill: 5  5. Gastroesophageal reflux disease without esophagitis Stable - dexlansoprazole (DEXILANT) 60 MG capsule; TAKE 1 CAPSULE (60 MG TOTAL) BY MOUTH DAILY.  Dispense: 30 capsule; Refill: 6  6. Chronic migraine without aura without status migrainosus, not intractable Stable - topiramate (TOPAMAX) 100 MG tablet; TAKE 2 TABLETS (200 MG TOTAL) BY MOUTH 2 (TWO) TIMES DAILY.  Dispense: 120 tablet; Refill: 6   Health Care Maintenance: At next visit Meds ordered this  encounter  Medications  . pregabalin (LYRICA) 75 MG capsule    Sig: Take 1 capsule (75 mg total) by mouth 2 (two) times daily.    Dispense:  180 capsule    Refill:  3  . amLODipine (NORVASC) 5 MG tablet    Sig: Take 1 tablet (5 mg total) by mouth daily.    Dispense:  30 tablet    Refill:  6  . hydrochlorothiazide (HYDRODIURIL) 25 MG tablet    Sig: Take 1 tablet (25 mg total) by mouth daily.    Dispense:  30 tablet    Refill:  6  . meloxicam (MOBIC) 15 MG tablet    Sig: Take 1 tablet (15 mg total) by mouth daily.    Dispense:  30 tablet    Refill:  6  . cyclobenzaprine (FLEXERIL) 10 MG tablet    Sig: Take 1 tablet (10 mg total) by mouth 2 (two) times daily. prn    Dispense:  60 tablet    Refill:  6  . metoprolol tartrate (LOPRESSOR) 100 MG tablet    Sig: Take 1 tablet (100 mg total) by mouth 2 (two) times daily.    Dispense:  60 tablet     Refill:  6  . cetirizine (ZYRTEC) 10 MG tablet    Sig: Take 1 tablet (10 mg total) by mouth daily.    Dispense:  30 tablet    Refill:  5  . dexlansoprazole (DEXILANT) 60 MG capsule    Sig: TAKE 1 CAPSULE (60 MG TOTAL) BY MOUTH DAILY.    Dispense:  30 capsule    Refill:  6  . topiramate (TOPAMAX) 100 MG tablet    Sig: TAKE 2 TABLETS (200 MG TOTAL) BY MOUTH 2 (TWO) TIMES DAILY.    Dispense:  120 tablet    Refill:  6    Follow-up: Return in about 6 months (around 10/30/2019) for medical conditions.       Hoy RegisterEnobong Stephaniemarie Stoffel, MD, FAAFP. [facility] [place], [place] [phone]   05/02/2019, 2:13 PM

## 2019-05-02 NOTE — Patient Instructions (Addendum)
Please call for your appointment with Orthopedics:  Clance Boll Referral Coordinator Joice [phone]

## 2019-05-03 ENCOUNTER — Telehealth: Payer: Self-pay

## 2019-05-03 LAB — BASIC METABOLIC PANEL
BUN/Creatinine Ratio: 9 (ref 9–23)
BUN: 11 mg/dL (ref 6–24)
CO2: 24 mmol/L (ref 20–29)
Calcium: 9.4 mg/dL (ref 8.7–10.2)
Chloride: 106 mmol/L (ref 96–106)
Creatinine, Ser: 1.16 mg/dL — ABNORMAL HIGH (ref 0.57–1.00)
GFR calc Af Amer: 62 mL/min/{1.73_m2} (ref >59)
GFR calc non Af Amer: 54 mL/min/{1.73_m2} — ABNORMAL LOW (ref >59)
Glucose: 83 mg/dL (ref 65–99)
Potassium: 4.2 mmol/L (ref 3.5–5.2)
Sodium: 144 mmol/L (ref 134–144)

## 2019-05-03 NOTE — Telephone Encounter (Signed)
Patient name and DOB has been verified Patient was informed of lab results. Patient had no questions.  

## 2019-05-03 NOTE — Telephone Encounter (Signed)
-----   Message from Charlott Rakes, MD sent at 05/03/2019  8:53 AM EDT ----- Labs are stable

## 2019-06-14 ENCOUNTER — Other Ambulatory Visit: Payer: Self-pay | Admitting: Family Medicine

## 2019-06-14 DIAGNOSIS — G43709 Chronic migraine without aura, not intractable, without status migrainosus: Secondary | ICD-10-CM

## 2019-06-14 MED FILL — VENTOLIN HFA 90 MCG INHALER: 108 (90 BAS | 25 days supply | Qty: 18 | Fill #0

## 2019-06-14 MED FILL — MELOXICAM 15 MG TABLET: 15 | 30 days supply | Qty: 30 | Fill #0

## 2019-06-14 MED FILL — DEXILANT DR 60 MG CAPSULE: 60 | 30 days supply | Qty: 30 | Fill #0

## 2019-06-14 MED FILL — HYDROCHLOROTHIAZIDE 25 MG T: 25 | 30 days supply | Qty: 30 | Fill #0

## 2019-06-14 MED FILL — AMLODIPINE BESYLATE 5 MG TA: 5 | 30 days supply | Qty: 30 | Fill #2

## 2019-06-14 MED FILL — CYCLOBENZAPRINE 10 MG TAB: 10 | 30 days supply | Qty: 60 | Fill #0

## 2019-06-14 MED FILL — hydrOXYzine HCL 50 MG TABS: 50 | 30 days supply | Qty: 90 | Fill #1

## 2019-06-15 MED FILL — SUMATRIPTAN SUCC 50 MG TAB: 50 | 23 days supply | Qty: 9 | Fill #0

## 2019-06-23 MED FILL — METOPROLOL TARTRATE 100 MG: 100 | 30 days supply | Qty: 60 | Fill #1

## 2019-06-23 MED FILL — VIT D2 1.25 MG (50,000 UNIT: 1.25 MG | 42 days supply | Qty: 6 | Fill #1

## 2019-06-23 MED FILL — TOPIRAMATE 100 MG TABLET: 100 | 30 days supply | Qty: 120 | Fill #1

## 2019-07-19 ENCOUNTER — Ambulatory Visit: Payer: BLUE CROSS/BLUE SHIELD

## 2019-07-19 MED FILL — METOPROLOL TARTRATE 100 MG: 100 | 30 days supply | Qty: 60 | Fill #0

## 2019-07-19 MED FILL — AMLODIPINE BESYLATE 5 MG TA: 5 | 30 days supply | Qty: 30 | Fill #3

## 2019-07-19 MED FILL — ALBUTEROL SULFATE HFA 108 (: 108 (90 BAS | 25 days supply | Qty: 18 | Fill #1

## 2019-07-19 MED FILL — hydrOXYzine HCL 50 MG TABS: 50 | 30 days supply | Qty: 90 | Fill #2

## 2019-07-19 MED FILL — HYDROCHLOROTHIAZIDE 25 MG T: 25 | 30 days supply | Qty: 30 | Fill #1

## 2019-07-19 MED FILL — ISOSORBIDE MN ER 30 MG TAB: 30 | 30 days supply | Qty: 30 | Fill #3

## 2019-07-19 MED FILL — VIT D2 1.25 MG (50,000 UNIT: 1.25 MG | 28 days supply | Qty: 4 | Fill #1

## 2019-07-19 MED FILL — CYCLOBENZAPRINE 10 MG TAB: 10 | 30 days supply | Qty: 60 | Fill #1

## 2019-07-24 MED FILL — DIVALPROEX SOD DR 500 MG TA: 500 | 20 days supply | Qty: 60 | Fill #0

## 2019-08-10 MED FILL — HYDROCHLOROTHIAZIDE 25 MG T: 25 | 30 days supply | Qty: 30 | Fill #2

## 2019-08-10 MED FILL — DIVALPROEX SOD DR 500 MG TA: 500 | 20 days supply | Qty: 60 | Fill #0

## 2019-08-10 MED FILL — METOPROLOL TARTRATE 100 MG: 100 | 30 days supply | Qty: 60 | Fill #1

## 2019-08-10 MED FILL — AMLODIPINE BESYLATE 5 MG TA: 5 | 30 days supply | Qty: 30 | Fill #0

## 2019-08-10 MED FILL — CYCLOBENZAPRINE 10 MG TAB: 10 | 30 days supply | Qty: 60 | Fill #2

## 2019-08-10 MED FILL — ISOSORBIDE MN ER 30 MG TAB: 30 | 30 days supply | Qty: 30 | Fill #4

## 2019-08-10 MED FILL — traZODone HCL 100 MG TABS: 100 | 30 days supply | Qty: 90 | Fill #0

## 2019-08-10 MED FILL — ALBUTEROL SULFATE HFA 108 (: 108 (90 BAS | 25 days supply | Qty: 18 | Fill #2

## 2019-08-10 MED FILL — VIT D2 1.25 MG (50,000 UNIT: 1.25 MG | 13 days supply | Qty: 2 | Fill #2

## 2019-08-10 MED FILL — MELOXICAM 15 MG TABLET: 15 | 30 days supply | Qty: 30 | Fill #1

## 2019-09-12 ENCOUNTER — Ambulatory Visit: Payer: Medicaid Other

## 2019-09-27 ENCOUNTER — Other Ambulatory Visit: Payer: Self-pay

## 2019-09-27 ENCOUNTER — Ambulatory Visit: Payer: Medicaid Other | Admitting: Podiatry

## 2019-09-27 ENCOUNTER — Telehealth: Payer: Self-pay

## 2019-09-27 ENCOUNTER — Other Ambulatory Visit: Payer: Self-pay | Admitting: Family Medicine

## 2019-09-27 ENCOUNTER — Encounter: Payer: Self-pay | Admitting: Podiatry

## 2019-09-27 VITALS — BP 137/83 | HR 71

## 2019-09-27 DIAGNOSIS — M79671 Pain in right foot: Secondary | ICD-10-CM

## 2019-09-27 DIAGNOSIS — Q828 Other specified congenital malformations of skin: Secondary | ICD-10-CM

## 2019-09-27 DIAGNOSIS — G629 Polyneuropathy, unspecified: Secondary | ICD-10-CM

## 2019-09-27 MED FILL — AMLODIPINE BESYLATE 5 MG TA: 5 | 30 days supply | Qty: 30 | Fill #1

## 2019-09-27 MED FILL — MELOXICAM 15 MG TABLET: 15 | 30 days supply | Qty: 30 | Fill #2

## 2019-09-27 MED FILL — HYDROCHLOROTHIAZIDE 25 MG T: 25 | 30 days supply | Qty: 30 | Fill #3

## 2019-09-27 MED FILL — CYCLOBENZAPRINE 10 MG TAB: 10 | 30 days supply | Qty: 60 | Fill #3

## 2019-09-27 MED FILL — ALBUTEROL SULFATE HFA 108 (: 108 (90 BAS | 25 days supply | Qty: 18 | Fill #3

## 2019-09-27 MED FILL — METOPROLOL TARTRATE 100 MG: 100 | 30 days supply | Qty: 60 | Fill #2

## 2019-09-27 MED FILL — ISOSORBIDE MN ER 30 MG TAB: 30 | 30 days supply | Qty: 30 | Fill #5

## 2019-09-27 NOTE — Patient Instructions (Signed)

## 2019-09-27 NOTE — Telephone Encounter (Signed)
Medicaid does not cover Dexilant-if appropriate can you change to protonix or omeprazole

## 2019-09-27 NOTE — Progress Notes (Signed)
Subjective: MARICSA SAMMONS presents today referred by Hoy Register, MD for complaint of painful callus(es) plantar aspect of right foot. She has had this lesion for several years. Relates difficulty ambulating due to lesion and has to alter her gait pattern to avoid putting pressure on lesion. Has tried filing it on occasion, but it has not relieved her pain.   She has history of lumbar radiculopathy and takes gabapentin for neuropathic pain.   She also has h/o fusion of right 1st MPJ fusion.   Past Medical History:  Diagnosis Date  . Anxiety   . Arthritis    lower back  . Asthma   . Bipolar disorder (HCC)   . Carpal tunnel syndrome, bilateral   . Chronic back pain   . Depression   . Diverticulosis   . Fx. left wrist   . GERD (gastroesophageal reflux disease)   . Heart murmur    "born with"  . Heart murmur   . HLD (hyperlipidemia)   . Hypertension   . IBS (irritable bowel syndrome)   . Internal hemorrhoids   . Muscle spasms of neck   . Neuropathy   . PUD (peptic ulcer disease)      Patient Active Problem List   Diagnosis Date Noted  . Non-ST elevation (NSTEMI) myocardial infarction (HCC)   . Contusion of chest 08/26/2018  . Cardiac arrest (HCC) 08/26/2018  . HLD (hyperlipidemia) 08/26/2018  . Elevated LFTs 08/26/2018  . History of migraine   . Degenerative joint disease 07/26/2017  . Morbid obesity (HCC) 12/24/2016  . Hyperlipidemia 12/23/2016  . Painful orthopaedic hardware (HCC) 03/24/2016  . Corns and callosities 03/24/2016  . PUD (peptic ulcer disease) 01/02/2016  . Onychomycosis 01/02/2016  . Seasonal allergies 11/19/2015  . GERD (gastroesophageal reflux disease) 08/06/2015  . Asthma 08/06/2015  . Migraine 08/06/2015  . Bipolar disorder (HCC) 07/25/2015  . Neuropathy 07/08/2015  . Major depressive disorder, recurrent, severe without psychotic features (HCC)   . Alcohol use disorder, severe, dependence (HCC) 11/16/2014  . Cocaine use disorder, severe,  dependence (HCC) 11/16/2014  . Acute calculous cholecystitis s/p lap chole 02/09/2014 02/09/2014  . HTN (hypertension) 07/25/2013  . Carpal tunnel syndrome 11/18/2012  . Lumbar radiculopathy 11/18/2012  . Anxiety state, unspecified 11/18/2012     Past Surgical History:  Procedure Laterality Date  . ARTHRODESIS METATARSALPHALANGEAL JOINT (MTPJ) Right 04/29/2016   Procedure: RIGHT GREAT TOE METATARSOPHALANGEAL JOINT (MTPJ) FUSION, HARDWARE REMOVAL;  Surgeon: Tarry Kos, MD;  Location: [facility];  Service: Orthopedics;  Laterality: Right;  RIGHT GREAT TOE METATARSOPHALANGEAL JOINT (MTPJ) FUSION, HARDWARE REMOVAL  . CARPAL TUNNEL RELEASE Right 2003  . CHOLECYSTECTOMY N/A 02/09/2014   Procedure: LAPAROSCOPIC CHOLECYSTECTOMY WITH INTRAOPERATIVE CHOLANGIOGRAM;  Surgeon: Valarie Merino, MD;  Location: [facility];  Service: General;  Laterality: N/A;  . CHOLECYSTECTOMY    . FOOT SURGERY    . HAMMER TOE FUSION Bilateral 2008  . HARDWARE REMOVAL Right 04/29/2016   Procedure: HARDWARE REMOVAL;  Surgeon: Tarry Kos, MD;  Location: [facility];  Service: Orthopedics;  Laterality: Right;  HARDWARE REMOVAL  . LEFT HEART CATH AND CORONARY ANGIOGRAPHY N/A 08/30/2018   Procedure: LEFT HEART CATH AND CORONARY ANGIOGRAPHY;  Surgeon: Marykay Lex, MD;  Location: [facility];  Service: Cardiovascular;  Laterality: N/A;  . ROTATOR CUFF REPAIR Right 2011  . WRIST FRACTURE SURGERY Left      Current Outpatient Medications on File Prior to Visit  Medication Sig Dispense Refill  .  albuterol (VENTOLIN HFA) 108 (90 Base) MCG/ACT inhaler INHALE 2 PUFFS INTO THE LUNGS EVERY 6 HOURS AS NEEDED FOR WHEEZING OR SHORTNESS OF BREATH. 18 g 3  . ALPRAZolam (XANAX) 0.25 MG tablet alprazolam 0.25 mg tablet  TAKE 1 2 TO 1 (ONE HALF TO ONE) TABLET BY MOUTH TWICE DAILY    . amLODipine (NORVASC) 5 MG tablet Take 1 tablet (5 mg total) by mouth daily. 30 tablet 6  . atorvastatin (LIPITOR) 40 MG  tablet Take 1 tablet (40 mg total) by mouth daily. 30 tablet 6  . buPROPion (WELLBUTRIN SR) 150 MG 12 hr tablet Take 150 mg by mouth 2 (two) times daily.    . cetirizine (ZYRTEC) 10 MG tablet Take 1 tablet (10 mg total) by mouth daily. 30 tablet 5  . cloNIDine (CATAPRES) 0.1 MG tablet Take 1 tablet (0.1 mg total) by mouth at bedtime as needed. For hot flashes 30 tablet 3  . colestipol (COLESTID) 1 g tablet Take 1 tablet (1 g total) by mouth every morning. 30 tablet 4  . cyclobenzaprine (FLEXERIL) 10 MG tablet Take 1 tablet (10 mg total) by mouth 2 (two) times daily. prn 60 tablet 6  . dexlansoprazole (DEXILANT) 60 MG capsule TAKE 1 CAPSULE (60 MG TOTAL) BY MOUTH DAILY. 30 capsule 6  . divalproex (DEPAKOTE) 500 MG DR tablet Take 500 mg by mouth 3 (three) times daily.    Marland Kitchen doxycycline (VIBRA-TABS) 100 MG tablet doxycycline hyclate 100 mg tablet  TAKE 1 TABLET BY MOUTH TWICE DAILY FOR 10 DAYS    . fluticasone (FLONASE) 50 MCG/ACT nasal spray PLACE 2 SPRAYS INTO BOTH NOSTRILS DAILY. 16 g 1  . gabapentin (NEURONTIN) 800 MG tablet gabapentin 800 mg tablet    . haloperidol (HALDOL) 5 MG tablet Take 1 tablet by mouth daily.  0  . hydrochlorothiazide (HYDRODIURIL) 25 MG tablet Take 1 tablet (25 mg total) by mouth daily. 30 tablet 6  . hydrOXYzine (ATARAX/VISTARIL) 50 MG tablet hydroxyzine HCl 50 mg tablet  TAKE 1 TABLET BY MOUTH 4 TIMES DAILY    . isosorbide mononitrate (IMDUR) 30 MG 24 hr tablet Take 1 tablet (30 mg total) by mouth daily. 90 tablet 3  . levofloxacin (LEVAQUIN) 750 MG tablet levofloxacin 750 mg tablet    . lidocaine (LIDODERM) 5 % Place 1 patch onto the skin daily. Remove & Discard patch within 12 hours or as directed by MD (Patient not taking: Reported on 01/30/2019) 30 patch 3  . meloxicam (MOBIC) 15 MG tablet Take 1 tablet (15 mg total) by mouth daily. 30 tablet 6  . metoprolol tartrate (LOPRESSOR) 100 MG tablet Take 1 tablet (100 mg total) by mouth 2 (two) times daily. 60 tablet 6  .  olopatadine (PATANOL) 0.1 % ophthalmic solution PLACE 1 DROP INTO BOTH EYES 2 TIMES DAILY. 5 mL 2  . oxyCODONE-acetaminophen (PERCOCET) 10-325 MG tablet oxycodone-acetaminophen 10 mg-325 mg tablet  TAKE 1 TABLET BY MOUTH THREE TIMES DAILY AS NEEDED    . pregabalin (LYRICA) 75 MG capsule Take 1 capsule (75 mg total) by mouth 2 (two) times daily. 180 capsule 1  . SUMAtriptan (IMITREX) 50 MG tablet TAKE 1 TABLET BY MOUTH AT THE ONSET OF A MIGRAINE. MAY REPEAT IN 2 HOURS IF HEADACHE PERSISTS OR RECURS. MAXIMUM DAILY DOSE 200 MG 9 tablet 2  . topiramate (TOPAMAX) 100 MG tablet TAKE 2 TABLETS (200 MG TOTAL) BY MOUTH 2 (TWO) TIMES DAILY. 120 tablet 6  . traZODone (DESYREL) 300 MG tablet Take 1  tablet (300 mg total) by mouth at bedtime. 30 tablet 0  . Vitamin D, Ergocalciferol, (DRISDOL) 1.25 MG (50000 UT) CAPS capsule TAKE 1 CAPSULE (50,000 UNITS TOTAL) BY MOUTH ONCE A WEEK. 9 capsule 1   Current Facility-Administered Medications on File Prior to Visit  Medication Dose Route Frequency Provider Last Rate Last Admin  . lidocaine (PF) (XYLOCAINE) 1 % injection 0.3 mL  0.3 mL Other Once Tyrell Antonio, MD         Allergies  Allergen Reactions  . Penicillins Anaphylaxis    Has patient had a PCN reaction causing immediate rash, facial/tongue/throat swelling, SOB or lightheadedness with hypotension: Yes Has patient had a PCN reaction causing severe rash involving mucus membranes or skin necrosis: Yes Has patient had a PCN reaction that required hospitalization Yes Has patient had a PCN reaction occurring within the last 10 years: No-more than 10 years ago If all of the above answers are "NO", then may proceed with Cephalosporin use.   Marland Kitchen Penicillins Anaphylaxis    DID THE REACTION INVOLVE: Swelling of the face/tongue/throat, SOB, or low BP? Yes Sudden or severe rash/hives, skin peeling, or the inside of the mouth or nose? Yes Did it require medical treatment? No When did it last happen? Within the past  10 years If all above answers are "NO", may proceed with cephalosporin use.    Jonne Ply [Aspirin] Rash  . Aspirin Rash     Social History   Occupational History  . Occupation: Cook    Comment: [employer]  Tobacco Use  . Smoking status: Light Tobacco Smoker    Types: Cigarettes  . Smokeless tobacco: Never Used  . Tobacco comment: "takes a puff sometimes"  Substance and Sexual Activity  . Alcohol use: Not Currently    Comment: Last drink 6 months ago.  . Drug use: Yes    Types: Cocaine, "Crack" cocaine    Comment: last used two years ago  . Sexual activity: Yes    Partners: Female     Family History  Problem Relation Age of Onset  . Pancreatic cancer Mother   . CAD Father   . Diabetes Mellitus II Father   . Heart failure Father   . Diabetes Father   . Breast cancer Paternal Grandmother   . HIV/AIDS Brother   . Other Sister        Brain tumor  . Colon cancer Neg Hx      Immunization History  Administered Date(s) Administered  . Tdap 12/15/2015     Objective: Vitals:   09/27/19 0813  BP: 137/83  Pulse: 71    Vascular Examination:  Capillary fill time to digits <3s b/l, palpable DP pulses b/l, faintly palpable PT pulses b/l, pedal hair sparse b/l and skin temperature gradient within normal limits b/l  Dermatological Examination: Pedal skin with normal turgor, texture and tone bilaterally, no open wounds bilaterally, no interdigital macerations bilaterally and porokeratotic lesion(s) plantarlateral aspect right foot. No erythema, no edema, no drainage, no flocculence.  Musculoskeletal: Normal muscle strength 5/5 to all lower extremity muscle groups bilaterally.  There is joint limitation consistent with fusion of 1st MPJ right foot.  Adductovarus right 5th digit.  Neurological: Protective sensation intact 5/5 intact bilaterally with 10g monofilament b/l, vibratory sensation intact b/l and proprioception intact bilaterally  Assessment: 1. Porokeratosis   2. Pain  in right foot   3. Neuropathy     Plan: -Medicaid ABN signed for 2021. Patient given a copy of her signed Medicaid ABN. -  Painful porokeratotic lesions right plantarlateral midfoot pared and enucleated with sterile scalpel blade. Light bleeding addressed with Lumicain Hemostatic Solution. Triple antibiotic ointment and band-aid applied. She may remove dressing on tomorrow. No further treatment required. -Applied horse shoe pad to right shoe insert. Dispensed additional felt horse shoe pads with instruction on application to shoe insert. -Patient to continue soft, supportive shoe gear daily. -Patient to report any pedal injuries to medical professional immediately. -Patient/POA to call should there be question/concern in the interim.  Return if symptoms worsen or fail to improve, for nail and callus trim.

## 2019-09-28 MED ORDER — ESOMEPRAZOLE MAGNESIUM 40 MG PO CPDR: 40.0000 mg | DELAYED_RELEASE_CAPSULE | Freq: Every day | ORAL | 6 refills | Status: DC

## 2019-09-28 MED FILL — VIT D2 1.25 MG (50,000 UNIT: 1.25 MG | 28 days supply | Qty: 4 | Fill #0

## 2019-09-28 MED FILL — ESOMEPRAZOLE MAG DR 40 MG C: 40 | 30 days supply | Qty: 30 | Fill #0

## 2019-09-28 NOTE — Addendum Note (Signed)
Addended by: Hoy Register on: 09/28/2019 08:34 AM   Modules accepted: Orders

## 2019-09-28 NOTE — Telephone Encounter (Signed)
Can you please inform this patient of insurance coverage?  Thank you

## 2019-10-02 MED FILL — SUMATRIPTAN SUCC 50 MG TAB: 50 | 23 days supply | Qty: 9 | Fill #0

## 2019-10-26 ENCOUNTER — Ambulatory Visit: Payer: Medicaid Other

## 2019-10-31 ENCOUNTER — Ambulatory Visit: Payer: Medicaid Other | Admitting: Family Medicine

## 2019-11-02 ENCOUNTER — Ambulatory Visit: Payer: Medicaid Other | Attending: Family

## 2019-11-02 DIAGNOSIS — Z23 Encounter for immunization: Secondary | ICD-10-CM

## 2019-11-02 NOTE — Progress Notes (Signed)
   Covid-19 Vaccination Clinic  Name:  KIANDRIA CLUM    MRN: [id number] DOB: [birth year]  11/02/2019  Ms. Hartney was observed post Covid-19 immunization for 15 minutes without incident. She was provided with Vaccine Information Sheet and instruction to access the V-Safe system.   Ms. Sliwinski was instructed to call 911 with any severe reactions post vaccine: Marland Kitchen Difficulty breathing  . Swelling of face and throat  . A fast heartbeat  . A bad rash all over body  . Dizziness and weakness   Immunizations Administered    Name Date Dose VIS Date Route   Moderna COVID-19 Vaccine 11/02/2019 10:12 AM 0.5 mL 07/25/2019 Intramuscular   Manufacturer: Moderna   Lot: [id number]   NDC: 91660-600-45

## 2019-11-22 ENCOUNTER — Other Ambulatory Visit: Payer: Self-pay | Admitting: Family Medicine

## 2019-11-22 DIAGNOSIS — Z1231 Encounter for screening mammogram for malignant neoplasm of breast: Secondary | ICD-10-CM

## 2019-11-24 ENCOUNTER — Ambulatory Visit: Payer: Medicaid Other

## 2019-12-05 ENCOUNTER — Ambulatory Visit: Payer: Medicaid Other | Attending: Family

## 2019-12-05 DIAGNOSIS — Z23 Encounter for immunization: Secondary | ICD-10-CM

## 2019-12-05 NOTE — Progress Notes (Signed)
   Covid-19 Vaccination Clinic  Name:  Amanda [name]    MRN: [id number] DOB: [birth year]  12/05/2019  [name] was observed post Covid-19 immunization for 15 minutes without incident. She was provided with Vaccine Information Sheet and instruction to access the V-Safe system.   [name] was instructed to call 911 with any severe reactions post vaccine: Marland Kitchen Difficulty breathing  . Swelling of face and throat  . A fast heartbeat  . A bad rash all over body  . Dizziness and weakness   Immunizations Administered    Name Date Dose VIS Date Route   Moderna COVID-19 Vaccine 12/05/2019 10:02 AM 0.5 mL 07/25/2019 Intramuscular   Manufacturer: Moderna   Lot: [id number]   NDC: 99144-458-48

## 2020-01-16 ENCOUNTER — Ambulatory Visit
Admission: RE | Admit: 2020-01-16 | Discharge: 2020-01-16 | Disposition: A | Discharge status: Home / Self Care | Payer: Medicaid Other | Source: Ambulatory Visit | Attending: Family Medicine | Admitting: Family Medicine

## 2020-01-16 ENCOUNTER — Other Ambulatory Visit: Payer: Self-pay

## 2020-01-16 ENCOUNTER — Other Ambulatory Visit: Payer: Self-pay | Admitting: Nurse Practitioner

## 2020-01-16 DIAGNOSIS — Z1231 Encounter for screening mammogram for malignant neoplasm of breast: Secondary | ICD-10-CM

## 2020-01-17 ENCOUNTER — Telehealth: Payer: Self-pay

## 2020-01-17 NOTE — Telephone Encounter (Signed)
-----   Message from Marcine Matar, MD sent at 01/16/2020  6:26 PM EDT ----- Let patient know that her mammogram came back okay.

## 2020-01-17 NOTE — Telephone Encounter (Signed)
Patient name and DOB has been verified Patient was informed of lab results. Patient had no questions.  

## 2020-01-25 ENCOUNTER — Emergency Department (HOSPITAL_COMMUNITY)
Admission: EM | Admit: 2020-01-25 | Discharge: 2020-01-26 | Disposition: A | Discharge status: Home / Self Care | Payer: Medicaid Other | Attending: Emergency Medicine | Admitting: Emergency Medicine

## 2020-01-25 ENCOUNTER — Encounter (HOSPITAL_COMMUNITY): Payer: Self-pay

## 2020-01-25 ENCOUNTER — Other Ambulatory Visit: Payer: Self-pay

## 2020-01-25 DIAGNOSIS — G8929 Other chronic pain: Secondary | ICD-10-CM | POA: Insufficient documentation

## 2020-01-25 DIAGNOSIS — F1410 Cocaine abuse, uncomplicated: Secondary | ICD-10-CM | POA: Diagnosis not present

## 2020-01-25 DIAGNOSIS — I252 Old myocardial infarction: Secondary | ICD-10-CM | POA: Diagnosis not present

## 2020-01-25 DIAGNOSIS — F1110 Opioid abuse, uncomplicated: Secondary | ICD-10-CM | POA: Diagnosis not present

## 2020-01-25 DIAGNOSIS — F1910 Other psychoactive substance abuse, uncomplicated: Secondary | ICD-10-CM | POA: Diagnosis not present

## 2020-01-25 DIAGNOSIS — F17210 Nicotine dependence, cigarettes, uncomplicated: Secondary | ICD-10-CM | POA: Diagnosis not present

## 2020-01-25 DIAGNOSIS — J45909 Unspecified asthma, uncomplicated: Secondary | ICD-10-CM | POA: Insufficient documentation

## 2020-01-25 DIAGNOSIS — F1210 Cannabis abuse, uncomplicated: Secondary | ICD-10-CM | POA: Diagnosis not present

## 2020-01-25 DIAGNOSIS — F1420 Cocaine dependence, uncomplicated: Secondary | ICD-10-CM | POA: Diagnosis present

## 2020-01-25 DIAGNOSIS — I10 Essential (primary) hypertension: Secondary | ICD-10-CM | POA: Insufficient documentation

## 2020-01-25 DIAGNOSIS — Z79899 Other long term (current) drug therapy: Secondary | ICD-10-CM | POA: Insufficient documentation

## 2020-01-25 DIAGNOSIS — R079 Chest pain, unspecified: Secondary | ICD-10-CM | POA: Insufficient documentation

## 2020-01-25 LAB — COMPREHENSIVE METABOLIC PANEL
ALT: 76 U/L — ABNORMAL HIGH (ref 0–44)
AST: 42 U/L — ABNORMAL HIGH (ref 15–41)
Albumin: 4.4 g/dL (ref 3.5–5.0)
Alkaline Phosphatase: 112 U/L (ref 38–126)
Anion gap: 12 (ref 5–15)
BUN: 9 mg/dL (ref 6–20)
CO2: 25 mmol/L (ref 22–32)
Calcium: 9.5 mg/dL (ref 8.9–10.3)
Chloride: 103 mmol/L (ref 98–111)
Creatinine, Ser: 0.89 mg/dL (ref 0.44–1.00)
GFR calc Af Amer: >60 mL/min (ref >60)
GFR calc non Af Amer: >60 mL/min (ref >60)
Glucose, Bld: 95 mg/dL (ref 70–99)
Potassium: 4 mmol/L (ref 3.5–5.1)
Sodium: 140 mmol/L (ref 135–145)
Total Bilirubin: 1 mg/dL (ref 0.3–1.2)
Total Protein: 7.6 g/dL (ref 6.5–8.1)

## 2020-01-25 LAB — CBC
HCT: 38.6 % (ref 36.0–46.0)
Hemoglobin: 12.5 g/dL (ref 12.0–15.0)
MCH: 31.2 pg (ref 26.0–34.0)
MCHC: 32.4 g/dL (ref 30.0–36.0)
MCV: 96.3 fL (ref 80.0–100.0)
Platelets: 253 10*3/uL (ref 150–400)
RBC: 4.01 MIL/uL (ref 3.87–5.11)
RDW: 15.9 % — ABNORMAL HIGH (ref 11.5–15.5)
WBC: 7.5 10*3/uL (ref 4.0–10.5)
nRBC: 0 % (ref 0.0–0.2)

## 2020-01-25 LAB — RAPID URINE DRUG SCREEN, HOSP PERFORMED
Amphetamines: NOT DETECTED
Barbiturates: NOT DETECTED
Benzodiazepines: NOT DETECTED
Cocaine: POSITIVE — AB
Opiates: POSITIVE — AB
Tetrahydrocannabinol: POSITIVE — AB

## 2020-01-25 LAB — ETHANOL: Alcohol, Ethyl (B): 47 mg/dL — ABNORMAL HIGH (ref <10)

## 2020-01-25 LAB — TROPONIN I (HIGH SENSITIVITY): Troponin I (High Sensitivity): 21 ng/L — ABNORMAL HIGH (ref <18)

## 2020-01-25 NOTE — ED Triage Notes (Signed)
Arrived by [facility] from home. Patient reports she wants to detox from crack, cocaine, alcohol, and marijuana.

## 2020-01-25 NOTE — ED Notes (Signed)
Pt complains of chest pain. I ran a EKG.

## 2020-01-25 NOTE — ED Provider Notes (Signed)
[facility]-EMERGENCY DEPT Provider Note   CSN: [id number] Arrival date & time: 01/25/20  2059     History Chief Complaint  Patient presents with  . Detox    Amanda [name] is a 54 y.o. female.  54 year old female who presents for help with her cocaine and alcohol use.  Patient also notes intermittent chronic chest pain which she states she is had for very long time.  States her chest pain it can be associated with drug use at times.  Denies any exertional component to it.  She is currently pain-free.  No fever cough or congestion.  Denies any SI or HI.  Denies any hallucinations or responding to internal stimuli.  No treatment use prior to arrival        Past Medical History:  Diagnosis Date  . Anxiety   . Arthritis    lower back  . Asthma   . Bipolar disorder (HCC)   . Carpal tunnel syndrome, bilateral   . Chronic back pain   . Depression   . Diverticulosis   . Fx. left wrist   . GERD (gastroesophageal reflux disease)   . Heart murmur    "born with"  . Heart murmur   . HLD (hyperlipidemia)   . Hypertension   . IBS (irritable bowel syndrome)   . Internal hemorrhoids   . Muscle spasms of neck   . Neuropathy   . PUD (peptic ulcer disease)     Patient Active Problem List   Diagnosis Date Noted  . Non-ST elevation (NSTEMI) myocardial infarction (HCC)   . Contusion of chest 08/26/2018  . Cardiac arrest (HCC) 08/26/2018  . HLD (hyperlipidemia) 08/26/2018  . Elevated LFTs 08/26/2018  . History of migraine   . Degenerative joint disease 07/26/2017  . Morbid obesity (HCC) 12/24/2016  . Hyperlipidemia 12/23/2016  . Painful orthopaedic hardware (HCC) 03/24/2016  . Corns and callosities 03/24/2016  . PUD (peptic ulcer disease) 01/02/2016  . Onychomycosis 01/02/2016  . Seasonal allergies 11/19/2015  . GERD (gastroesophageal reflux disease) 08/06/2015  . Asthma 08/06/2015  . Migraine 08/06/2015  . Bipolar disorder (HCC) 07/25/2015  .  Neuropathy 07/08/2015  . Major depressive disorder, recurrent, severe without psychotic features (HCC)   . Alcohol use disorder, severe, dependence (HCC) 11/16/2014  . Cocaine use disorder, severe, dependence (HCC) 11/16/2014  . Acute calculous cholecystitis s/p lap chole 02/09/2014 02/09/2014  . HTN (hypertension) 07/25/2013  . Carpal tunnel syndrome 11/18/2012  . Lumbar radiculopathy 11/18/2012  . Anxiety state, unspecified 11/18/2012    Past Surgical History:  Procedure Laterality Date  . ARTHRODESIS METATARSALPHALANGEAL JOINT (MTPJ) Right 04/29/2016   Procedure: RIGHT GREAT TOE METATARSOPHALANGEAL JOINT (MTPJ) FUSION, HARDWARE REMOVAL;  Surgeon: Tarry Kos, MD;  Location: [facility];  Service: Orthopedics;  Laterality: Right;  RIGHT GREAT TOE METATARSOPHALANGEAL JOINT (MTPJ) FUSION, HARDWARE REMOVAL  . CARPAL TUNNEL RELEASE Right 2003  . CHOLECYSTECTOMY N/A 02/09/2014   Procedure: LAPAROSCOPIC CHOLECYSTECTOMY WITH INTRAOPERATIVE CHOLANGIOGRAM;  Surgeon: Valarie Merino, MD;  Location: [facility];  Service: General;  Laterality: N/A;  . CHOLECYSTECTOMY    . FOOT SURGERY    . HAMMER TOE FUSION Bilateral 2008  . HARDWARE REMOVAL Right 04/29/2016   Procedure: HARDWARE REMOVAL;  Surgeon: Tarry Kos, MD;  Location: [facility];  Service: Orthopedics;  Laterality: Right;  HARDWARE REMOVAL  . LEFT HEART CATH AND CORONARY ANGIOGRAPHY N/A 08/30/2018   Procedure: LEFT HEART CATH AND CORONARY ANGIOGRAPHY;  Surgeon: Marykay Lex,  MD;  Location: [facility];  Service: Cardiovascular;  Laterality: N/A;  . ROTATOR CUFF REPAIR Right 2011  . WRIST FRACTURE SURGERY Left      OB History   No obstetric history on file.     Family History  Problem Relation Age of Onset  . Pancreatic cancer Mother   . CAD Father   . Diabetes Mellitus II Father   . Heart failure Father   . Diabetes Father   . Breast cancer Paternal Grandmother   . HIV/AIDS Brother   . Other  Sister        Brain tumor  . Colon cancer Neg Hx     Social History   Tobacco Use  . Smoking status: Light Tobacco Smoker    Types: Cigarettes  . Smokeless tobacco: Never Used  . Tobacco comment: "takes a puff sometimes"  Substance Use Topics  . Alcohol use: Yes    Comment: heavily daily  . Drug use: Yes    Types: Cocaine, "Crack" cocaine, Marijuana    Comment: last used two years ago    Home Medications Prior to Admission medications   Medication Sig Start Date End Date Taking? Authorizing Provider  albuterol (VENTOLIN HFA) 108 (90 Base) MCG/ACT inhaler INHALE 2 PUFFS INTO THE LUNGS EVERY 6 HOURS AS NEEDED FOR WHEEZING OR SHORTNESS OF BREATH. 01/30/19   Hoy Register, MD  ALPRAZolam (XANAX) 0.25 MG tablet alprazolam 0.25 mg tablet  TAKE 1 2 TO 1 (ONE HALF TO ONE) TABLET BY MOUTH TWICE DAILY    [provider]  amLODipine (NORVASC) 5 MG tablet Take 1 tablet (5 mg total) by mouth daily. 05/02/19   Hoy Register, MD  atorvastatin (LIPITOR) 40 MG tablet Take 1 tablet (40 mg total) by mouth daily. 09/21/18   Azalee Course, PA  buPROPion (WELLBUTRIN SR) 150 MG 12 hr tablet Take 150 mg by mouth 2 (two) times daily.    [provider]  cetirizine (ZYRTEC) 10 MG tablet Take 1 tablet (10 mg total) by mouth daily. 05/02/19   Hoy Register, MD  cloNIDine (CATAPRES) 0.1 MG tablet Take 1 tablet (0.1 mg total) by mouth at bedtime as needed. For hot flashes 10/25/18   Hoy Register, MD  colestipol (COLESTID) 1 g tablet Take 1 tablet (1 g total) by mouth every morning. 09/24/16   Pyrtle, Carie Caddy, MD  cyclobenzaprine (FLEXERIL) 10 MG tablet Take 1 tablet (10 mg total) by mouth 2 (two) times daily. prn 05/02/19   Hoy Register, MD  divalproex (DEPAKOTE) 500 MG DR tablet Take 500 mg by mouth 3 (three) times daily.    [provider]  doxycycline (VIBRA-TABS) 100 MG tablet doxycycline hyclate 100 mg tablet  TAKE 1 TABLET BY MOUTH TWICE DAILY FOR 10 DAYS    [provider]    esomeprazole (NEXIUM) 40 MG capsule Take 1 capsule (40 mg total) by mouth daily. 09/28/19   Hoy Register, MD  fluticasone (FLONASE) 50 MCG/ACT nasal spray PLACE 2 SPRAYS INTO BOTH NOSTRILS DAILY. 05/19/18   Hoy Register, MD  gabapentin (NEURONTIN) 800 MG tablet gabapentin 800 mg tablet    [provider]  haloperidol (HALDOL) 5 MG tablet Take 1 tablet by mouth daily. 02/13/16   [provider]  hydrochlorothiazide (HYDRODIURIL) 25 MG tablet Take 1 tablet (25 mg total) by mouth daily. 05/02/19   Hoy Register, MD  hydrOXYzine (ATARAX/VISTARIL) 50 MG tablet hydroxyzine HCl 50 mg tablet  TAKE 1 TABLET BY MOUTH 4 TIMES DAILY  [provider]  isosorbide mononitrate (IMDUR) 30 MG 24 hr tablet Take 1 tablet (30 mg total) by mouth daily. 10/04/18   Lennette Bihari, MD  levofloxacin (LEVAQUIN) 750 MG tablet levofloxacin 750 mg tablet    [provider]  lidocaine (LIDODERM) 5 % Place 1 patch onto the skin daily. Remove & Discard patch within 12 hours or as directed by MD Patient not taking: Reported on 01/30/2019 10/25/18   Hoy Register, MD  meloxicam (MOBIC) 15 MG tablet Take 1 tablet (15 mg total) by mouth daily. 05/02/19   Hoy Register, MD  metoprolol tartrate (LOPRESSOR) 100 MG tablet Take 1 tablet (100 mg total) by mouth 2 (two) times daily. 05/02/19   Hoy Register, MD  olopatadine (PATANOL) 0.1 % ophthalmic solution PLACE 1 DROP INTO BOTH EYES 2 TIMES DAILY. 10/18/18   Hoy Register, MD  oxyCODONE-acetaminophen (PERCOCET) 10-325 MG tablet oxycodone-acetaminophen 10 mg-325 mg tablet  TAKE 1 TABLET BY MOUTH THREE TIMES DAILY AS NEEDED    [provider]  pregabalin (LYRICA) 75 MG capsule Take 1 capsule (75 mg total) by mouth 2 (two) times daily. 05/02/19   Hoy Register, MD  SUMAtriptan (IMITREX) 50 MG tablet TAKE 1 TABLET BY MOUTH AT THE ONSET OF A MIGRAINE. MAY REPEAT IN 2 HOURS IF HEADACHE PERSISTS OR RECURS. MAXIMUM DAILY DOSE 200 MG 06/15/19    Newlin, Enobong, MD  topiramate (TOPAMAX) 100 MG tablet TAKE 2 TABLETS (200 MG TOTAL) BY MOUTH 2 (TWO) TIMES DAILY. 05/02/19   Hoy Register, MD  traZODone (DESYREL) 300 MG tablet Take 1 tablet (300 mg total) by mouth at bedtime. 11/21/14   Adonis Brook, NP  Vitamin D, Ergocalciferol, (DRISDOL) 1.25 MG (50000 UNIT) CAPS capsule TAKE 1 CAPSULE (50,000 UNITS TOTAL) BY MOUTH ONCE A WEEK. 09/28/19   Hoy Register, MD    Allergies    Penicillins, Penicillins, Asa [aspirin], and Aspirin  Review of Systems   Review of Systems  All other systems reviewed and are negative.   Physical Exam Updated Vital Signs BP (!) 146/84 (BP Location: Left Arm)   Pulse (!) 115   Temp 98.1 F (36.7 C) (Oral)   Resp 19   Ht 1.702 m (5\' 7" )   Wt 93.9 kg   SpO2 95%   BMI 32.42 kg/m   Physical Exam Vitals and nursing note reviewed.  Constitutional:      General: She is not in acute distress.    Appearance: Normal appearance. She is well-developed. She is not toxic-appearing.  HENT:     Head: Normocephalic and atraumatic.  Eyes:     General: Lids are normal.     Conjunctiva/sclera: Conjunctivae normal.     Pupils: Pupils are equal, round, and reactive to light.  Neck:     Thyroid: No thyroid mass.     Trachea: No tracheal deviation.  Cardiovascular:     Rate and Rhythm: Normal rate and regular rhythm.     Heart sounds: Normal heart sounds. No murmur. No gallop.   Pulmonary:     Effort: Pulmonary effort is normal. No respiratory distress.     Breath sounds: Normal breath sounds. No stridor. No decreased breath sounds, wheezing, rhonchi or rales.  Abdominal:     General: Bowel sounds are normal. There is no distension.     Palpations: Abdomen is soft.     Tenderness: There is no abdominal tenderness. There is no rebound.  Musculoskeletal:        General: No tenderness. Normal  range of motion.     Cervical back: Normal range of motion and neck supple.  Skin:    General: Skin is warm and dry.       Findings: No abrasion or rash.  Neurological:     Mental Status: She is alert and oriented to person, place, and time.     GCS: GCS eye subscore is 4. GCS verbal subscore is 5. GCS motor subscore is 6.     Cranial Nerves: No cranial nerve deficit.     Sensory: No sensory deficit.  Psychiatric:        Attention and Perception: Attention normal.        Speech: Speech normal.        Behavior: Behavior normal.        Thought Content: Thought content does not include homicidal or suicidal ideation. Thought content does not include homicidal or suicidal plan.     ED Results / Procedures / Treatments   Labs (all labs ordered are listed, but only abnormal results are displayed) Labs Reviewed  COMPREHENSIVE METABOLIC PANEL - Abnormal; Notable for the following components:      Result Value   AST 42 (*)    ALT 76 (*)    All other components within normal limits  ETHANOL - Abnormal; Notable for the following components:   Alcohol, Ethyl (B) 47 (*)    All other components within normal limits  CBC - Abnormal; Notable for the following components:   RDW 15.9 (*)    All other components within normal limits  RAPID URINE DRUG SCREEN, HOSP PERFORMED - Abnormal; Notable for the following components:   Opiates POSITIVE (*)    Cocaine POSITIVE (*)    Tetrahydrocannabinol POSITIVE (*)    All other components within normal limits  TROPONIN I (HIGH SENSITIVITY) - Abnormal; Notable for the following components:   Troponin I (High Sensitivity) 21 (*)    All other components within normal limits  TROPONIN I (HIGH SENSITIVITY)    EKG None ED ECG REPORT   Date: 01/26/2020  Rate: 109  Rhythm: sinus tachycardia  QRS Axis: normal  Intervals: normal  ST/T Wave abnormalities: nonspecific ST changes  Conduction Disutrbances:none  Narrative Interpretation:   Old EKG Reviewed: none available  I have personally reviewed the EKG tracing and agree with the computerized printout as  noted. Radiology No results found.  Procedures Procedures (including critical care time)  Medications Ordered in ED Medications - No data to display  ED Course  I have reviewed the triage vital signs and the nursing notes.  Pertinent labs & imaging results that were available during my care of the patient were reviewed by me and considered in my medical decision making (see chart for details).    MDM Rules/Calculators/A&P                      Patient without acute psychiatric emergency here.  Patient's first troponin mildly elevated and will repeat.  EKG without acute ischemic changes.  She has no active chest pain.  Patient to be given outpatient resources. Will give patient referral for outpatient resources Final Clinical Impression(s) / ED Diagnoses Final diagnoses:  None    Rx / DC Orders ED Discharge Orders    None       Lacretia Leigh, MD 01/26/20 [phone]

## 2020-01-26 ENCOUNTER — Telehealth (HOSPITAL_COMMUNITY): Payer: Self-pay | Admitting: Licensed Clinical Social Worker

## 2020-01-26 LAB — TROPONIN I (HIGH SENSITIVITY): Troponin I (High Sensitivity): 22 ng/L — ABNORMAL HIGH (ref <18)

## 2020-01-26 NOTE — ED Provider Notes (Signed)
12:52 AM Assumed care from Dr. Freida Busman, please see their note for full history, physical and decision making until this point. In brief this is a 54 y.o. year old female who presented to the ED tonight with Detox     Here with multiple complaints most of which is want detox of multiple drugs and alcohol.  Also had some intermittent chest pain chest pain second troponin otherwise stable for discharge and outpatient resources.  Second troponin with minimal change. Stable for discharge.   Discharge instructions, including strict return precautions for new or worsening symptoms, given. Patient and/or family verbalized understanding and agreement with the plan as described.   Labs, studies and imaging reviewed by myself and considered in medical decision making if ordered. Imaging interpreted by radiology.  Labs Reviewed  COMPREHENSIVE METABOLIC PANEL - Abnormal; Notable for the following components:      Result Value   AST 42 (*)    ALT 76 (*)    All other components within normal limits  ETHANOL - Abnormal; Notable for the following components:   Alcohol, Ethyl (B) 47 (*)    All other components within normal limits  CBC - Abnormal; Notable for the following components:   RDW 15.9 (*)    All other components within normal limits  RAPID URINE DRUG SCREEN, HOSP PERFORMED - Abnormal; Notable for the following components:   Opiates POSITIVE (*)    Cocaine POSITIVE (*)    Tetrahydrocannabinol POSITIVE (*)    All other components within normal limits  TROPONIN I (HIGH SENSITIVITY) - Abnormal; Notable for the following components:   Troponin I (High Sensitivity) 21 (*)    All other components within normal limits  TROPONIN I (HIGH SENSITIVITY) - Abnormal; Notable for the following components:   Troponin I (High Sensitivity) 22 (*)    All other components within normal limits    No orders to display    No follow-ups on file.    Laneisha Mino, Barbara Cower, MD 01/26/20 [phone]

## 2020-01-30 NOTE — Telephone Encounter (Signed)
Assertive engagement and referral for alternate providers

## 2020-02-06 ENCOUNTER — Encounter: Payer: Self-pay | Admitting: Podiatry

## 2020-02-06 ENCOUNTER — Ambulatory Visit (INDEPENDENT_AMBULATORY_CARE_PROVIDER_SITE_OTHER): Payer: Medicaid Other | Admitting: Podiatry

## 2020-02-06 ENCOUNTER — Other Ambulatory Visit: Payer: Self-pay

## 2020-02-06 DIAGNOSIS — Q666 Other congenital valgus deformities of feet: Secondary | ICD-10-CM | POA: Diagnosis not present

## 2020-02-06 DIAGNOSIS — G629 Polyneuropathy, unspecified: Secondary | ICD-10-CM

## 2020-02-06 DIAGNOSIS — Q828 Other specified congenital malformations of skin: Secondary | ICD-10-CM

## 2020-02-06 DIAGNOSIS — M79671 Pain in right foot: Secondary | ICD-10-CM

## 2020-02-06 NOTE — Patient Instructions (Signed)

## 2020-02-13 NOTE — Progress Notes (Signed)
Subjective: Amanda [name] is a pleasant 54 y.o. female patient seen today painful plantar lesions bl feet.  Pain prevent comfortable ambulation. Aggravating factor is weightbearing with or without shoegear. Patient has h/o neuropathy.   She voices no new pedal problems on today's visit.  Past Medical History:  Diagnosis Date  . Anxiety   . Arthritis    lower back  . Asthma   . Bipolar disorder ([place])   . Carpal tunnel syndrome, bilateral   . Chronic back pain   . Depression   . Diverticulosis   . Fx. left wrist   . GERD (gastroesophageal reflux disease)   . Heart murmur    "born with"  . Heart murmur   . HLD (hyperlipidemia)   . Hypertension   . IBS (irritable bowel syndrome)   . Internal hemorrhoids   . Muscle spasms of neck   . Neuropathy   . PUD (peptic ulcer disease)     Patient Active Problem List   Diagnosis Date Noted  . Non-ST elevation (NSTEMI) myocardial infarction ([place])   . Contusion of chest 08/26/2018  . Cardiac arrest ([place]) 08/26/2018  . HLD (hyperlipidemia) 08/26/2018  . Elevated LFTs 08/26/2018  . History of migraine   . Degenerative joint disease 07/26/2017  . Morbid obesity ([place]) 12/24/2016  . Hyperlipidemia 12/23/2016  . Painful orthopaedic hardware ([place]) 03/24/2016  . Corns and callosities 03/24/2016  . PUD (peptic ulcer disease) 01/02/2016  . Onychomycosis 01/02/2016  . Seasonal allergies 11/19/2015  . GERD (gastroesophageal reflux disease) 08/06/2015  . Asthma 08/06/2015  . Migraine 08/06/2015  . Bipolar disorder ([place]) 07/25/2015  . Neuropathy 07/08/2015  . Major depressive disorder, recurrent, severe without psychotic features ([place])   . Alcohol use disorder, severe, dependence ([place]) 11/16/2014  . Cocaine use disorder, severe, dependence ([place]) 11/16/2014  . Acute calculous cholecystitis s/p lap chole 02/09/2014 02/09/2014  . HTN (hypertension) 07/25/2013  . Carpal tunnel syndrome 11/18/2012  . Lumbar radiculopathy 11/18/2012  . Anxiety  state, unspecified 11/18/2012    Current Outpatient Medications on File Prior to Visit  Medication Sig Dispense Refill  . Cholecalciferol 125 MCG (5000 UT) TABS Take 1 tablet by mouth 2 times a day    . albuterol (VENTOLIN HFA) 108 (90 Base) MCG/ACT inhaler INHALE 2 PUFFS INTO THE LUNGS EVERY 6 HOURS AS NEEDED FOR WHEEZING OR SHORTNESS OF BREATH. 18 g 3  . ALPRAZolam (XANAX) 0.25 MG tablet alprazolam 0.25 mg tablet  TAKE 1 2 TO 1 (ONE HALF TO ONE) TABLET BY MOUTH TWICE DAILY    . amLODipine (NORVASC) 5 MG tablet Take 1 tablet (5 mg total) by mouth daily. 30 tablet 6  . atorvastatin (LIPITOR) 40 MG tablet Take 1 tablet (40 mg total) by mouth daily. 30 tablet 6  . buPROPion (WELLBUTRIN SR) 150 MG 12 hr tablet Take 150 mg by mouth 2 (two) times daily.    Marland Kitchen buPROPion (WELLBUTRIN XL) 300 MG 24 hr tablet Take 300 mg by mouth every morning.    . cetirizine (ZYRTEC) 10 MG tablet Take 1 tablet (10 mg total) by mouth daily. 30 tablet 5  . cloNIDine (CATAPRES) 0.1 MG tablet Take 1 tablet (0.1 mg total) by mouth at bedtime as needed. For hot flashes 30 tablet 3  . colestipol (COLESTID) 1 g tablet Take 1 tablet (1 g total) by mouth every morning. 30 tablet 4  . cyclobenzaprine (FLEXERIL) 10 MG tablet Take 1 tablet (10 mg total) by mouth 2 (two) times daily. prn 60  tablet 6  . divalproex (DEPAKOTE) 500 MG DR tablet Take 500 mg by mouth 3 (three) times daily.    Marland Kitchen doxycycline (VIBRA-TABS) 100 MG tablet doxycycline hyclate 100 mg tablet  TAKE 1 TABLET BY MOUTH TWICE DAILY FOR 10 DAYS    . esomeprazole (NEXIUM) 40 MG capsule Take 1 capsule (40 mg total) by mouth daily. 30 capsule 6  . fluticasone (FLONASE) 50 MCG/ACT nasal spray PLACE 2 SPRAYS INTO BOTH NOSTRILS DAILY. 16 g 1  . gabapentin (NEURONTIN) 100 MG capsule     . gabapentin (NEURONTIN) 800 MG tablet gabapentin 800 mg tablet    . haloperidol (HALDOL) 5 MG tablet Take 1 tablet by mouth daily.  0  . hydrochlorothiazide (HYDRODIURIL) 25 MG tablet Take 1  tablet (25 mg total) by mouth daily. 30 tablet 6  . HYDROcodone-acetaminophen (NORCO/VICODIN) 5-325 MG tablet Take 1 tablet by mouth 4 (four) times daily as needed.    . hydrOXYzine (ATARAX/VISTARIL) 50 MG tablet hydroxyzine HCl 50 mg tablet  TAKE 1 TABLET BY MOUTH 4 TIMES DAILY    . ibuprofen (ADVIL) 800 MG tablet Take 800 mg by mouth 4 (four) times daily as needed.    Marland Kitchen ipratropium (ATROVENT) 0.03 % nasal spray Place 2 sprays into both nostrils 2 (two) times daily.    . isosorbide mononitrate (IMDUR) 30 MG 24 hr tablet Take 1 tablet (30 mg total) by mouth daily. 90 tablet 3  . levofloxacin (LEVAQUIN) 750 MG tablet levofloxacin 750 mg tablet    . lidocaine (LIDODERM) 5 % Place 1 patch onto the skin daily. Remove & Discard patch within 12 hours or as directed by MD (Patient not taking: Reported on 01/30/2019) 30 patch 3  . meloxicam (MOBIC) 15 MG tablet Take 1 tablet (15 mg total) by mouth daily. 30 tablet 6  . meloxicam (MOBIC) 7.5 MG tablet Take 7.5 mg by mouth daily as needed.    . metoprolol tartrate (LOPRESSOR) 100 MG tablet Take 1 tablet (100 mg total) by mouth 2 (two) times daily. 60 tablet 6  . NARCAN 4 MG/0.1ML LIQD nasal spray kit     . nitroGLYCERIN (NITROSTAT) 0.4 MG SL tablet     . NUCYNTA 50 MG tablet Take 50 mg by mouth 3 (three) times daily as needed.    Marland Kitchen olopatadine (PATANOL) 0.1 % ophthalmic solution PLACE 1 DROP INTO BOTH EYES 2 TIMES DAILY. 5 mL 2  . oxyCODONE-acetaminophen (PERCOCET) 10-325 MG tablet oxycodone-acetaminophen 10 mg-325 mg tablet  TAKE 1 TABLET BY MOUTH THREE TIMES DAILY AS NEEDED    . pantoprazole (PROTONIX) 40 MG tablet Take 40 mg by mouth every morning.    . pregabalin (LYRICA) 75 MG capsule Take 1 capsule (75 mg total) by mouth 2 (two) times daily. 180 capsule 1  . SUMAtriptan (IMITREX) 50 MG tablet TAKE 1 TABLET BY MOUTH AT THE ONSET OF A MIGRAINE. MAY REPEAT IN 2 HOURS IF HEADACHE PERSISTS OR RECURS. MAXIMUM DAILY DOSE 200 MG 9 tablet 2  . topiramate  (TOPAMAX) 100 MG tablet TAKE 2 TABLETS (200 MG TOTAL) BY MOUTH 2 (TWO) TIMES DAILY. 120 tablet 6  . topiramate (TOPAMAX) 25 MG tablet     . traMADol (ULTRAM-ER) 200 MG 24 hr tablet Take 200 mg by mouth daily.    . traZODone (DESYREL) 300 MG tablet Take 1 tablet (300 mg total) by mouth at bedtime. 30 tablet 0  . Vitamin D, Ergocalciferol, (DRISDOL) 1.25 MG (50000 UNIT) CAPS capsule TAKE 1 CAPSULE (50,000 UNITS  TOTAL) BY MOUTH ONCE A WEEK. 2 capsule 1   Current Facility-Administered Medications on File Prior to Visit  Medication Dose Route Frequency Provider Last Rate Last Admin  . lidocaine (PF) (XYLOCAINE) 1 % injection 0.3 mL  0.3 mL Other Once Magnus Sinning, MD        Allergies  Allergen Reactions  . Penicillins Anaphylaxis    Has patient had a PCN reaction causing immediate rash, facial/tongue/throat swelling, SOB or lightheadedness with hypotension: Yes Has patient had a PCN reaction causing severe rash involving mucus membranes or skin necrosis: Yes Has patient had a PCN reaction that required hospitalization Yes Has patient had a PCN reaction occurring within the last 10 years: No-more than 10 years ago If all of the above answers are "NO", then may proceed with Cephalosporin use.   Marland Kitchen Penicillins Anaphylaxis    DID THE REACTION INVOLVE: Swelling of the face/tongue/throat, SOB, or low BP? Yes Sudden or severe rash/hives, skin peeling, or the inside of the mouth or nose? Yes Did it require medical treatment? No When did it last happen? Within the past 10 years If all above answers are "NO", may proceed with cephalosporin use.    Diona Fanti [Aspirin] Rash  . Aspirin Rash    Objective: Physical Exam  General: Amanda [name] is a pleasant 54 y.o. African American female, WD, WN in NAD. AAO x 3.   Vascular:  Capillary refill time to digits immediate b/l. Palpable pedal pulses b/l LE. Pedal hair sparse. Lower extremity skin temperature gradient within normal  limits.  Dermatological:  Pedal skin with normal turgor, texture and tone bilaterally. No open wounds bilaterally. No interdigital macerations bilaterally. Porokeratotic lesion(s) plantarlateral aspect of the right foot. No erythema, no edema, no drainage, no flocculence.  Musculoskeletal:  Normal muscle strength 5/5 to all lower extremity muscle groups bilaterally. No gross bony deformities bilaterally. Limited joint ROM to the 1st MPJ of the right foot consistent with MPJ fusion. 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  Neurological:  Protective sensation intact 5/5 intact bilaterally with 10g monofilament b/l. Vibratory sensation intact b/l. Proprioception intact bilaterally.  Assessment and Plan:  1. Porokeratosis   2. Pain in right foot   3. Neuropathy    -Examined patient. -ABN signed for 2021. Patient has copy and copy is in her chart. -No new findings. No new orders. -Painful porokeratotic lesion(s) plantarlateral aspect of the right foot pared and enucleated with sterile scalpel blade without incident. -Patient to report any pedal injuries to medical professional immediately. -Patient to continue soft, supportive shoe gear daily. -Patient/POA to call should there be question/concern in the interim.  Return in about 3 months (around 05/08/2020) for callus trim.  Marzetta Board, DPM

## 2020-05-17 ENCOUNTER — Encounter: Payer: Self-pay | Admitting: Podiatry

## 2020-05-17 ENCOUNTER — Ambulatory Visit (INDEPENDENT_AMBULATORY_CARE_PROVIDER_SITE_OTHER): Payer: Medicaid Other | Admitting: Podiatry

## 2020-05-17 ENCOUNTER — Other Ambulatory Visit: Payer: Self-pay

## 2020-05-17 DIAGNOSIS — Q828 Other specified congenital malformations of skin: Secondary | ICD-10-CM

## 2020-05-17 DIAGNOSIS — G629 Polyneuropathy, unspecified: Secondary | ICD-10-CM

## 2020-05-17 DIAGNOSIS — M79671 Pain in right foot: Secondary | ICD-10-CM

## 2020-05-21 NOTE — Progress Notes (Signed)
Subjective: Amanda [name] is a pleasant 54 y.o. female patient seen today painful plantar lesions of the right foot.  Pain prevents comfortable ambulation. Aggravating factor is weightbearing with or without shoegear. Patient has h/o neuropathy.  She states she will be going to her niece's wedding reception.   Volney Presser, FNP, is her PCP. Last vist was 02/06/2020.  She voices no new pedal problems on today's visit.  Past Medical History:  Diagnosis Date  . Anxiety   . Arthritis    lower back  . Asthma   . Bipolar disorder ([place])   . Carpal tunnel syndrome, bilateral   . Chronic back pain   . Depression   . Diverticulosis   . Fx. left wrist   . GERD (gastroesophageal reflux disease)   . Heart murmur    "born with"  . Heart murmur   . HLD (hyperlipidemia)   . Hypertension   . IBS (irritable bowel syndrome)   . Internal hemorrhoids   . Muscle spasms of neck   . Neuropathy   . PUD (peptic ulcer disease)     Patient Active Problem List   Diagnosis Date Noted  . Non-ST elevation (NSTEMI) myocardial infarction ([place])   . Contusion of chest 08/26/2018  . Cardiac arrest ([place]) 08/26/2018  . HLD (hyperlipidemia) 08/26/2018  . Elevated LFTs 08/26/2018  . History of migraine   . Degenerative joint disease 07/26/2017  . Morbid obesity ([place]) 12/24/2016  . Hyperlipidemia 12/23/2016  . Painful orthopaedic hardware ([place]) 03/24/2016  . Corns and callosities 03/24/2016  . PUD (peptic ulcer disease) 01/02/2016  . Onychomycosis 01/02/2016  . Seasonal allergies 11/19/2015  . GERD (gastroesophageal reflux disease) 08/06/2015  . Asthma 08/06/2015  . Migraine 08/06/2015  . Bipolar disorder ([place]) 07/25/2015  . Neuropathy 07/08/2015  . Major depressive disorder, recurrent, severe without psychotic features ([place])   . Alcohol use disorder, severe, dependence ([place]) 11/16/2014  . Cocaine use disorder, severe, dependence ([place]) 11/16/2014  . Acute calculous cholecystitis s/p lap chole  02/09/2014 02/09/2014  . HTN (hypertension) 07/25/2013  . Carpal tunnel syndrome 11/18/2012  . Lumbar radiculopathy 11/18/2012  . Anxiety state, unspecified 11/18/2012    Current Outpatient Medications on File Prior to Visit  Medication Sig Dispense Refill  . albuterol (VENTOLIN HFA) 108 (90 Base) MCG/ACT inhaler INHALE 2 PUFFS INTO THE LUNGS EVERY 6 HOURS AS NEEDED FOR WHEEZING OR SHORTNESS OF BREATH. 18 g 3  . ALPRAZolam (XANAX) 0.25 MG tablet alprazolam 0.25 mg tablet  TAKE 1 2 TO 1 (ONE HALF TO ONE) TABLET BY MOUTH TWICE DAILY    . amLODipine (NORVASC) 5 MG tablet Take 1 tablet (5 mg total) by mouth daily. 30 tablet 6  . atorvastatin (LIPITOR) 40 MG tablet Take 1 tablet (40 mg total) by mouth daily. 30 tablet 6  . baclofen (LIORESAL) 10 MG tablet Take 10 mg by mouth 3 (three) times daily as needed.    Marland Kitchen buPROPion (WELLBUTRIN SR) 100 MG 12 hr tablet Take 100 mg by mouth every morning.    Marland Kitchen buPROPion (WELLBUTRIN SR) 150 MG 12 hr tablet Take 150 mg by mouth 2 (two) times daily.    Marland Kitchen buPROPion (WELLBUTRIN XL) 300 MG 24 hr tablet Take 300 mg by mouth every morning.    . cetirizine (ZYRTEC) 10 MG tablet Take 1 tablet (10 mg total) by mouth daily. 30 tablet 5  . Cholecalciferol 125 MCG (5000 UT) TABS Take 1 tablet by mouth 2 times a day    .  citalopram (CELEXA) 10 MG tablet Take 10 mg by mouth daily.    . citalopram (CELEXA) 20 MG tablet Take 20 mg by mouth daily.    . cloNIDine (CATAPRES) 0.1 MG tablet Take 1 tablet (0.1 mg total) by mouth at bedtime as needed. For hot flashes 30 tablet 3  . colestipol (COLESTID) 1 g tablet Take 1 tablet (1 g total) by mouth every morning. 30 tablet 4  . cyclobenzaprine (FLEXERIL) 10 MG tablet Take 1 tablet (10 mg total) by mouth 2 (two) times daily. prn 60 tablet 6  . diclofenac Sodium (VOLTAREN) 1 % GEL SMARTSIG:4 Gram(s) Topical Every 6 Hours PRN    . divalproex (DEPAKOTE) 500 MG DR tablet Take 500 mg by mouth 3 (three) times daily.    Marland Kitchen doxycycline  (VIBRA-TABS) 100 MG tablet doxycycline hyclate 100 mg tablet  TAKE 1 TABLET BY MOUTH TWICE DAILY FOR 10 DAYS    . esomeprazole (NEXIUM) 40 MG capsule Take 1 capsule (40 mg total) by mouth daily. 30 capsule 6  . fluticasone (FLONASE) 50 MCG/ACT nasal spray PLACE 2 SPRAYS INTO BOTH NOSTRILS DAILY. 16 g 1  . gabapentin (NEURONTIN) 100 MG capsule     . gabapentin (NEURONTIN) 800 MG tablet gabapentin 800 mg tablet    . haloperidol (HALDOL) 1 MG tablet Take by mouth.    . haloperidol (HALDOL) 5 MG tablet Take 1 tablet by mouth daily.  0  . hydrochlorothiazide (HYDRODIURIL) 25 MG tablet Take 1 tablet (25 mg total) by mouth daily. 30 tablet 6  . HYDROcodone-acetaminophen (NORCO/VICODIN) 5-325 MG tablet Take 1 tablet by mouth 4 (four) times daily as needed.    . hydrOXYzine (ATARAX/VISTARIL) 50 MG tablet hydroxyzine HCl 50 mg tablet  TAKE 1 TABLET BY MOUTH 4 TIMES DAILY    . ibuprofen (ADVIL) 800 MG tablet Take 800 mg by mouth 4 (four) times daily as needed.    Marland Kitchen ipratropium (ATROVENT) 0.03 % nasal spray Place 2 sprays into both nostrils 2 (two) times daily.    . isosorbide mononitrate (IMDUR) 30 MG 24 hr tablet Take 1 tablet (30 mg total) by mouth daily. 90 tablet 3  . levofloxacin (LEVAQUIN) 750 MG tablet levofloxacin 750 mg tablet    . lidocaine (LIDODERM) 5 % Place 1 patch onto the skin daily. Remove & Discard patch within 12 hours or as directed by MD (Patient not taking: Reported on 01/30/2019) 30 patch 3  . meloxicam (MOBIC) 15 MG tablet Take 1 tablet (15 mg total) by mouth daily. 30 tablet 6  . meloxicam (MOBIC) 7.5 MG tablet Take 7.5 mg by mouth daily as needed.    . metoprolol tartrate (LOPRESSOR) 100 MG tablet Take 1 tablet (100 mg total) by mouth 2 (two) times daily. 60 tablet 6  . metoprolol tartrate (LOPRESSOR) 50 MG tablet Take 50 mg by mouth 2 (two) times daily.    Marland Kitchen NARCAN 4 MG/0.1ML LIQD nasal spray kit     . nitroGLYCERIN (NITROSTAT) 0.4 MG SL tablet     . NUCYNTA 50 MG tablet Take 50  mg by mouth 3 (three) times daily as needed.    Marland Kitchen olopatadine (PATANOL) 0.1 % ophthalmic solution PLACE 1 DROP INTO BOTH EYES 2 TIMES DAILY. 5 mL 2  . oxyCODONE-acetaminophen (PERCOCET) 10-325 MG tablet oxycodone-acetaminophen 10 mg-325 mg tablet  TAKE 1 TABLET BY MOUTH THREE TIMES DAILY AS NEEDED    . pantoprazole (PROTONIX) 40 MG tablet Take 40 mg by mouth every morning.    Marland Kitchen  pregabalin (LYRICA) 75 MG capsule Take 1 capsule (75 mg total) by mouth 2 (two) times daily. 180 capsule 1  . QUEtiapine (SEROQUEL) 25 MG tablet Take 25 mg by mouth at bedtime.    . SUMAtriptan (IMITREX) 50 MG tablet TAKE 1 TABLET BY MOUTH AT THE ONSET OF A MIGRAINE. MAY REPEAT IN 2 HOURS IF HEADACHE PERSISTS OR RECURS. MAXIMUM DAILY DOSE 200 MG 9 tablet 2  . SYMBICORT 80-4.5 MCG/ACT inhaler SMARTSIG:2 Puff(s) By Mouth Twice Daily    . topiramate (TOPAMAX) 100 MG tablet TAKE 2 TABLETS (200 MG TOTAL) BY MOUTH 2 (TWO) TIMES DAILY. 120 tablet 6  . topiramate (TOPAMAX) 25 MG tablet     . traMADol (ULTRAM-ER) 200 MG 24 hr tablet Take 200 mg by mouth daily.    . traZODone (DESYREL) 300 MG tablet Take 1 tablet (300 mg total) by mouth at bedtime. 30 tablet 0  . Vitamin D, Ergocalciferol, (DRISDOL) 1.25 MG (50000 UNIT) CAPS capsule TAKE 1 CAPSULE (50,000 UNITS TOTAL) BY MOUTH ONCE A WEEK. 2 capsule 1   Current Facility-Administered Medications on File Prior to Visit  Medication Dose Route Frequency Provider Last Rate Last Admin  . lidocaine (PF) (XYLOCAINE) 1 % injection 0.3 mL  0.3 mL Other Once Magnus Sinning, MD        Allergies  Allergen Reactions  . Penicillins Anaphylaxis    Has patient had a PCN reaction causing immediate rash, facial/tongue/throat swelling, SOB or lightheadedness with hypotension: Yes Has patient had a PCN reaction causing severe rash involving mucus membranes or skin necrosis: Yes Has patient had a PCN reaction that required hospitalization Yes Has patient had a PCN reaction occurring within the  last 10 years: No-more than 10 years ago If all of the above answers are "NO", then may proceed with Cephalosporin use.   Marland Kitchen Penicillins Anaphylaxis    DID THE REACTION INVOLVE: Swelling of the face/tongue/throat, SOB, or low BP? Yes Sudden or severe rash/hives, skin peeling, or the inside of the mouth or nose? Yes Did it require medical treatment? No When did it last happen? Within the past 10 years If all above answers are "NO", may proceed with cephalosporin use.    Diona Fanti [Aspirin] Rash  . Aspirin Rash    Objective: Physical Exam  General: Amanda [name] is a pleasant 54 y.o. African American female, WD, WN in NAD. AAO x 3.   Vascular:  Capillary refill time to digits immediate b/l. Palpable pedal pulses b/l LE. Pedal hair sparse. Lower extremity skin temperature gradient within normal limits.  Dermatological:  Pedal skin with normal turgor, texture and tone bilaterally. No open wounds bilaterally. No interdigital macerations bilaterally. Symptomatic porokeratotic lesion(s) plantarlateral aspect of the right foot. No erythema, no edema, no drainage, no flocculence.  Musculoskeletal:  Normal muscle strength 5/5 to all lower extremity muscle groups bilaterally. No gross bony deformities bilaterally. Limited joint ROM to the 1st MPJ of the right foot consistent with MPJ fusion. 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  Neurological:  Protective sensation intact 5/5 intact bilaterally with 10g monofilament b/l. Vibratory sensation intact b/l. Proprioception intact bilaterally.  Assessment and Plan:  1. Porokeratosis   2. Pain in right foot   3. Neuropathy    -Examined patient. -ABN signed for 2021. Patient has copy and copy is in her chart. . -No new findings. No new orders. -Painful porokeratotic lesion(s) plantarlateral aspect of the right foot pared and enucleated with sterile scalpel blade without incident. -Patient to report any  pedal injuries to medical professional  immediately. -Patient to continue soft, supportive shoe gear daily. -Patient/POA to call should there be question/concern in the interim.  Return in about 3 months (around 08/16/2020).  Marzetta Board, DPM

## 2020-06-20 ENCOUNTER — Ambulatory Visit (INDEPENDENT_AMBULATORY_CARE_PROVIDER_SITE_OTHER): Payer: Medicare Other | Admitting: Sports Medicine

## 2020-06-20 ENCOUNTER — Other Ambulatory Visit: Payer: Self-pay

## 2020-06-20 ENCOUNTER — Encounter: Payer: Self-pay | Admitting: Sports Medicine

## 2020-06-20 DIAGNOSIS — Q828 Other specified congenital malformations of skin: Secondary | ICD-10-CM | POA: Diagnosis not present

## 2020-06-20 DIAGNOSIS — G629 Polyneuropathy, unspecified: Secondary | ICD-10-CM

## 2020-06-20 DIAGNOSIS — M79672 Pain in left foot: Secondary | ICD-10-CM

## 2020-06-20 DIAGNOSIS — L603 Nail dystrophy: Secondary | ICD-10-CM | POA: Diagnosis not present

## 2020-06-20 DIAGNOSIS — B070 Plantar wart: Secondary | ICD-10-CM

## 2020-06-20 DIAGNOSIS — M79671 Pain in right foot: Secondary | ICD-10-CM

## 2020-06-20 NOTE — Progress Notes (Signed)
Subjective: Amanda [name] is a 54 y.o. female patient who presents to office for evaluation of Right> Left foot pain secondary to callus skin. Patient complains of pain at the lesion present Right>Left foot at the bottom of the feet for years.  Patient reports that she goes routinely to Dr. Adah Perl to have them trimmed and that helps very little well but wants to discuss the possibility having them cut out.  Patient is also concerned with thickness to her toenails and reports that many years ago she had the big toenails removed due to severe fungus but is concerned about fungus in her other toenails.  Patient also reports that she has a history of previous foot surgery and correction of her hammertoes and reports that that surgery was very painful.  Patient reports that she deals with a lot of neuropathy pains and has a history of back problems as well.  Patient denies any other pedal complaints at this time.  Patient Active Problem List   Diagnosis Date Noted  . Non-ST elevation (NSTEMI) myocardial infarction ([place])   . Contusion of chest 08/26/2018  . Cardiac arrest ([place]) 08/26/2018  . HLD (hyperlipidemia) 08/26/2018  . Elevated LFTs 08/26/2018  . History of migraine   . Degenerative joint disease 07/26/2017  . Morbid obesity ([place]) 12/24/2016  . Hyperlipidemia 12/23/2016  . Painful orthopaedic hardware ([place]) 03/24/2016  . Corns and callosities 03/24/2016  . PUD (peptic ulcer disease) 01/02/2016  . Onychomycosis 01/02/2016  . Seasonal allergies 11/19/2015  . GERD (gastroesophageal reflux disease) 08/06/2015  . Asthma 08/06/2015  . Migraine 08/06/2015  . Bipolar disorder ([place]) 07/25/2015  . Neuropathy 07/08/2015  . Major depressive disorder, recurrent, severe without psychotic features ([place])   . Alcohol use disorder, severe, dependence ([place]) 11/16/2014  . Cocaine use disorder, severe, dependence ([place]) 11/16/2014  . Acute calculous cholecystitis s/p lap chole 02/09/2014 02/09/2014  . HTN  (hypertension) 07/25/2013  . Carpal tunnel syndrome 11/18/2012  . Lumbar radiculopathy 11/18/2012  . Anxiety state, unspecified 11/18/2012    Current Outpatient Medications on File Prior to Visit  Medication Sig Dispense Refill  . albuterol (VENTOLIN HFA) 108 (90 Base) MCG/ACT inhaler INHALE 2 PUFFS INTO THE LUNGS EVERY 6 HOURS AS NEEDED FOR WHEEZING OR SHORTNESS OF BREATH. 18 g 3  . ALPRAZolam (XANAX) 0.25 MG tablet alprazolam 0.25 mg tablet  TAKE 1 2 TO 1 (ONE HALF TO ONE) TABLET BY MOUTH TWICE DAILY    . amLODipine (NORVASC) 5 MG tablet Take 1 tablet (5 mg total) by mouth daily. 30 tablet 6  . atorvastatin (LIPITOR) 40 MG tablet Take 1 tablet (40 mg total) by mouth daily. (Patient taking differently: Take 40 mg by mouth daily. ) 30 tablet 6  . baclofen (LIORESAL) 10 MG tablet Take 10 mg by mouth 3 (three) times daily as needed.    Marland Kitchen buPROPion (WELLBUTRIN SR) 100 MG 12 hr tablet Take 100 mg by mouth every morning.    Marland Kitchen buPROPion (WELLBUTRIN SR) 150 MG 12 hr tablet Take 150 mg by mouth 2 (two) times daily.    Marland Kitchen buPROPion (WELLBUTRIN XL) 300 MG 24 hr tablet Take 300 mg by mouth every morning.    . cetirizine (ZYRTEC) 10 MG tablet Take 1 tablet (10 mg total) by mouth daily. 30 tablet 5  . Cholecalciferol 125 MCG (5000 UT) TABS Take 1 tablet by mouth 2 times a day    . citalopram (CELEXA) 10 MG tablet Take 10 mg by mouth daily.    Marland Kitchen  citalopram (CELEXA) 20 MG tablet Take 20 mg by mouth daily.    . cloNIDine (CATAPRES) 0.1 MG tablet Take 1 tablet (0.1 mg total) by mouth at bedtime as needed. For hot flashes 30 tablet 3  . colestipol (COLESTID) 1 g tablet Take 1 tablet (1 g total) by mouth every morning. 30 tablet 4  . cyclobenzaprine (FLEXERIL) 10 MG tablet Take 1 tablet (10 mg total) by mouth 2 (two) times daily. prn 60 tablet 6  . diclofenac Sodium (VOLTAREN) 1 % GEL SMARTSIG:4 Gram(s) Topical Every 6 Hours PRN    . divalproex (DEPAKOTE) 500 MG DR tablet Take 500 mg by mouth 3 (three) times  daily.    Marland Kitchen doxycycline (VIBRA-TABS) 100 MG tablet doxycycline hyclate 100 mg tablet  TAKE 1 TABLET BY MOUTH TWICE DAILY FOR 10 DAYS    . esomeprazole (NEXIUM) 40 MG capsule Take 1 capsule (40 mg total) by mouth daily. 30 capsule 6  . fluticasone (FLONASE) 50 MCG/ACT nasal spray PLACE 2 SPRAYS INTO BOTH NOSTRILS DAILY. 16 g 1  . gabapentin (NEURONTIN) 100 MG capsule     . gabapentin (NEURONTIN) 800 MG tablet gabapentin 800 mg tablet    . haloperidol (HALDOL) 1 MG tablet Take by mouth.    . haloperidol (HALDOL) 5 MG tablet Take 1 tablet by mouth daily.  0  . hydrochlorothiazide (HYDRODIURIL) 25 MG tablet Take 1 tablet (25 mg total) by mouth daily. 30 tablet 6  . HYDROcodone-acetaminophen (NORCO/VICODIN) 5-325 MG tablet Take 1 tablet by mouth 4 (four) times daily as needed.    . hydrOXYzine (ATARAX/VISTARIL) 50 MG tablet hydroxyzine HCl 50 mg tablet  TAKE 1 TABLET BY MOUTH 4 TIMES DAILY    . ibuprofen (ADVIL) 800 MG tablet Take 800 mg by mouth 4 (four) times daily as needed.    Marland Kitchen ipratropium (ATROVENT) 0.03 % nasal spray Place 2 sprays into both nostrils 2 (two) times daily.    . isosorbide mononitrate (IMDUR) 30 MG 24 hr tablet Take 1 tablet (30 mg total) by mouth daily. 90 tablet 3  . levofloxacin (LEVAQUIN) 750 MG tablet levofloxacin 750 mg tablet    . lidocaine (LIDODERM) 5 % Place 1 patch onto the skin daily. Remove & Discard patch within 12 hours or as directed by MD 30 patch 3  . meloxicam (MOBIC) 15 MG tablet Take 1 tablet (15 mg total) by mouth daily. 30 tablet 6  . meloxicam (MOBIC) 7.5 MG tablet Take 7.5 mg by mouth daily as needed.    . metoprolol tartrate (LOPRESSOR) 100 MG tablet Take 1 tablet (100 mg total) by mouth 2 (two) times daily. 60 tablet 6  . metoprolol tartrate (LOPRESSOR) 50 MG tablet Take 50 mg by mouth 2 (two) times daily.    Marland Kitchen NARCAN 4 MG/0.1ML LIQD nasal spray kit     . nitroGLYCERIN (NITROSTAT) 0.4 MG SL tablet     . NUCYNTA 50 MG tablet Take 50 mg by mouth 3  (three) times daily as needed.    Marland Kitchen olopatadine (PATANOL) 0.1 % ophthalmic solution PLACE 1 DROP INTO BOTH EYES 2 TIMES DAILY. 5 mL 2  . oxyCODONE-acetaminophen (PERCOCET) 10-325 MG tablet oxycodone-acetaminophen 10 mg-325 mg tablet  TAKE 1 TABLET BY MOUTH THREE TIMES DAILY AS NEEDED    . pantoprazole (PROTONIX) 40 MG tablet Take 40 mg by mouth every morning.    . pregabalin (LYRICA) 75 MG capsule Take 1 capsule (75 mg total) by mouth 2 (two) times daily. 180 capsule 1  .  QUEtiapine (SEROQUEL) 25 MG tablet Take 25 mg by mouth at bedtime.    . SUMAtriptan (IMITREX) 50 MG tablet TAKE 1 TABLET BY MOUTH AT THE ONSET OF A MIGRAINE. MAY REPEAT IN 2 HOURS IF HEADACHE PERSISTS OR RECURS. MAXIMUM DAILY DOSE 200 MG 9 tablet 2  . SYMBICORT 80-4.5 MCG/ACT inhaler SMARTSIG:2 Puff(s) By Mouth Twice Daily    . topiramate (TOPAMAX) 100 MG tablet TAKE 2 TABLETS (200 MG TOTAL) BY MOUTH 2 (TWO) TIMES DAILY. 120 tablet 6  . topiramate (TOPAMAX) 25 MG tablet     . traMADol (ULTRAM-ER) 200 MG 24 hr tablet Take 200 mg by mouth daily.    . traZODone (DESYREL) 300 MG tablet Take 1 tablet (300 mg total) by mouth at bedtime. 30 tablet 0  . Vitamin D, Ergocalciferol, (DRISDOL) 1.25 MG (50000 UNIT) CAPS capsule TAKE 1 CAPSULE (50,000 UNITS TOTAL) BY MOUTH ONCE A WEEK. 2 capsule 1   Current Facility-Administered Medications on File Prior to Visit  Medication Dose Route Frequency Provider Last Rate Last Admin  . lidocaine (PF) (XYLOCAINE) 1 % injection 0.3 mL  0.3 mL Other Once Magnus Sinning, MD        Allergies  Allergen Reactions  . Penicillins Anaphylaxis    Has patient had a PCN reaction causing immediate rash, facial/tongue/throat swelling, SOB or lightheadedness with hypotension: Yes Has patient had a PCN reaction causing severe rash involving mucus membranes or skin necrosis: Yes Has patient had a PCN reaction that required hospitalization Yes Has patient had a PCN reaction occurring within the last 10 years:  No-more than 10 years ago If all of the above answers are "NO", then may proceed with Cephalosporin use.   Marland Kitchen Penicillins Anaphylaxis    DID THE REACTION INVOLVE: Swelling of the face/tongue/throat, SOB, or low BP? Yes Sudden or severe rash/hives, skin peeling, or the inside of the mouth or nose? Yes Did it require medical treatment? No When did it last happen? Within the past 10 years If all above answers are "NO", may proceed with cephalosporin use.    Diona Fanti [Aspirin] Rash  . Aspirin Rash    Objective:  General: Alert and oriented x3 in no acute distress  Dermatology: Keratotic lesion present plantar foot right greater than left at the cuboid on the right and submet 5 on the left with skin lines transversing the lesion, pain is present with direct pressure to the lesion with a central nucleated core noted, no webspace macerations, no ecchymosis bilateral, all nails x 8 are mildly elongated and thickened, there is minimal nails noted to bilateral hallux from previous history of nail removal.  Vascular: Dorsalis Pedis and Posterior Tibial pedal pulses 1/4, Capillary Fill Time 5 seconds, decreased pedal hair growth bilateral, no edema bilateral lower extremities, Temperature gradient within normal limits.  Neurology: Gross sensation intact via light touch bilateral.  Subjective burning bilateral with history of neuropathy.  Musculoskeletal: Mild tenderness with palpation at the keratotic lesion site on Right>Left, Muscular strength 5/5 in all groups without pain or limitation on range of motion.  Pes planus foot deformity noted bilateral.  Assessment and Plan: Problem List Items Addressed This Visit      Nervous and Auditory   Neuropathy    Other Visit Diagnoses    Onychodystrophy    -  Primary   Relevant Orders   Culture, fungus without smear   Plantar wart       Porokeratosis       Pain in right foot  Left foot pain          -Complete examination performed -Discussed  treatment options -Parred keratoic lesion using a chisel x2 blade; treated the area with Cantharone and advised on blister reaction -Advised patient that we should consider treating her conservatively because surgery may not result in a favorable outcome due to the likelihood of recurrence and scarring -Advised good supportive shoes and inserts may benefit from custom orthotics to offload problem areas -Fungal culture was obtained by removing a portion of the hard nail itself from each of the involved toenails 2 through 5 bilateral using a sterile nail nipper and sent to [facility] lab. Patient tolerated the biopsy procedure well without discomfort or need for anesthesia. -Patient to return to office 3 weeks for follow culture results and follow-up on keratotic lesions or sooner if condition worsens.  Landis Martins, DPM

## 2020-07-11 ENCOUNTER — Ambulatory Visit (INDEPENDENT_AMBULATORY_CARE_PROVIDER_SITE_OTHER): Payer: Medicare Other | Admitting: Sports Medicine

## 2020-07-11 ENCOUNTER — Encounter: Payer: Self-pay | Admitting: Sports Medicine

## 2020-07-11 ENCOUNTER — Other Ambulatory Visit: Payer: Self-pay

## 2020-07-11 DIAGNOSIS — M79672 Pain in left foot: Secondary | ICD-10-CM

## 2020-07-11 DIAGNOSIS — Q828 Other specified congenital malformations of skin: Secondary | ICD-10-CM

## 2020-07-11 DIAGNOSIS — B070 Plantar wart: Secondary | ICD-10-CM

## 2020-07-11 DIAGNOSIS — G629 Polyneuropathy, unspecified: Secondary | ICD-10-CM | POA: Diagnosis not present

## 2020-07-11 DIAGNOSIS — L603 Nail dystrophy: Secondary | ICD-10-CM | POA: Diagnosis not present

## 2020-07-11 DIAGNOSIS — M79671 Pain in right foot: Secondary | ICD-10-CM

## 2020-07-11 MED ORDER — GORDONS UREA 40 % EX OINT: TOPICAL_OINTMENT | CUTANEOUS | 0 refills | Status: DC | PRN

## 2020-07-11 NOTE — Progress Notes (Signed)
Subjective: Amanda [name] is a 54 y.o. female patient who returns to office for follow up to hard skin and for discussion of fungal nail results.  Patient denies any other pedal complaints at this time.  Patient Active Problem List   Diagnosis Date Noted  . Non-ST elevation (NSTEMI) myocardial infarction ([place])   . Contusion of chest 08/26/2018  . Cardiac arrest ([place]) 08/26/2018  . HLD (hyperlipidemia) 08/26/2018  . Elevated LFTs 08/26/2018  . History of migraine   . Degenerative joint disease 07/26/2017  . Morbid obesity ([place]) 12/24/2016  . Hyperlipidemia 12/23/2016  . Painful orthopaedic hardware ([place]) 03/24/2016  . Corns and callosities 03/24/2016  . PUD (peptic ulcer disease) 01/02/2016  . Onychomycosis 01/02/2016  . Seasonal allergies 11/19/2015  . GERD (gastroesophageal reflux disease) 08/06/2015  . Asthma 08/06/2015  . Migraine 08/06/2015  . Bipolar disorder ([place]) 07/25/2015  . Neuropathy 07/08/2015  . Major depressive disorder, recurrent, severe without psychotic features ([place])   . Alcohol use disorder, severe, dependence ([place]) 11/16/2014  . Cocaine use disorder, severe, dependence ([place]) 11/16/2014  . Acute calculous cholecystitis s/p lap chole 02/09/2014 02/09/2014  . HTN (hypertension) 07/25/2013  . Carpal tunnel syndrome 11/18/2012  . Lumbar radiculopathy 11/18/2012  . Anxiety state, unspecified 11/18/2012    Current Outpatient Medications on File Prior to Visit  Medication Sig Dispense Refill  . albuterol (VENTOLIN HFA) 108 (90 Base) MCG/ACT inhaler INHALE 2 PUFFS INTO THE LUNGS EVERY 6 HOURS AS NEEDED FOR WHEEZING OR SHORTNESS OF BREATH. 18 g 3  . ALPRAZolam (XANAX) 0.25 MG tablet alprazolam 0.25 mg tablet  TAKE 1 2 TO 1 (ONE HALF TO ONE) TABLET BY MOUTH TWICE DAILY    . ALPRAZolam (XANAX) 0.5 MG tablet Take 0.5 mg by mouth daily as needed.    Marland Kitchen amLODipine (NORVASC) 5 MG tablet Take 1 tablet (5 mg total) by mouth daily. 30 tablet 6  . atorvastatin (LIPITOR) 10 MG  tablet Take 10 mg by mouth at bedtime.    Marland Kitchen atorvastatin (LIPITOR) 40 MG tablet Take 1 tablet (40 mg total) by mouth daily. (Patient taking differently: Take 40 mg by mouth daily. ) 30 tablet 6  . baclofen (LIORESAL) 10 MG tablet Take 10 mg by mouth 3 (three) times daily as needed.    Marland Kitchen buPROPion (WELLBUTRIN SR) 100 MG 12 hr tablet Take 100 mg by mouth every morning.    Marland Kitchen buPROPion (WELLBUTRIN SR) 150 MG 12 hr tablet Take 150 mg by mouth 2 (two) times daily.    Marland Kitchen buPROPion (WELLBUTRIN XL) 300 MG 24 hr tablet Take 300 mg by mouth every morning.    . cetirizine (ZYRTEC) 10 MG tablet Take 1 tablet (10 mg total) by mouth daily. 30 tablet 5  . Cholecalciferol 125 MCG (5000 UT) TABS Take 1 tablet by mouth 2 times a day    . citalopram (CELEXA) 10 MG tablet Take 10 mg by mouth daily.    . citalopram (CELEXA) 20 MG tablet Take 20 mg by mouth daily.    . cloNIDine (CATAPRES) 0.1 MG tablet Take 1 tablet (0.1 mg total) by mouth at bedtime as needed. For hot flashes 30 tablet 3  . colestipol (COLESTID) 1 g tablet Take 1 tablet (1 g total) by mouth every morning. 30 tablet 4  . cyclobenzaprine (FLEXERIL) 10 MG tablet Take 1 tablet (10 mg total) by mouth 2 (two) times daily. prn 60 tablet 6  . diclofenac Sodium (VOLTAREN) 1 % GEL SMARTSIG:4 Gram(s) Topical Every 6  Hours PRN    . divalproex (DEPAKOTE) 500 MG DR tablet Take 500 mg by mouth 3 (three) times daily.    Marland Kitchen doxycycline (VIBRA-TABS) 100 MG tablet doxycycline hyclate 100 mg tablet  TAKE 1 TABLET BY MOUTH TWICE DAILY FOR 10 DAYS    . esomeprazole (NEXIUM) 40 MG capsule Take 1 capsule (40 mg total) by mouth daily. 30 capsule 6  . fluticasone (FLONASE) 50 MCG/ACT nasal spray PLACE 2 SPRAYS INTO BOTH NOSTRILS DAILY. 16 g 1  . gabapentin (NEURONTIN) 100 MG capsule     . gabapentin (NEURONTIN) 800 MG tablet gabapentin 800 mg tablet    . haloperidol (HALDOL) 1 MG tablet Take by mouth.    . haloperidol (HALDOL) 2 MG tablet Take 2 mg by mouth 2 (two) times  daily.    . haloperidol (HALDOL) 5 MG tablet Take 1 tablet by mouth daily.  0  . hydrochlorothiazide (HYDRODIURIL) 25 MG tablet Take 1 tablet (25 mg total) by mouth daily. 30 tablet 6  . HYDROcodone-acetaminophen (NORCO/VICODIN) 5-325 MG tablet Take 1 tablet by mouth 4 (four) times daily as needed.    . hydrOXYzine (ATARAX/VISTARIL) 50 MG tablet hydroxyzine HCl 50 mg tablet  TAKE 1 TABLET BY MOUTH 4 TIMES DAILY    . ibuprofen (ADVIL) 800 MG tablet Take 800 mg by mouth 4 (four) times daily as needed.    Marland Kitchen ipratropium (ATROVENT) 0.03 % nasal spray Place 2 sprays into both nostrils 2 (two) times daily.    . isosorbide mononitrate (IMDUR) 30 MG 24 hr tablet Take 1 tablet (30 mg total) by mouth daily. 90 tablet 3  . levofloxacin (LEVAQUIN) 750 MG tablet levofloxacin 750 mg tablet    . lidocaine (LIDODERM) 5 % Place 1 patch onto the skin daily. Remove & Discard patch within 12 hours or as directed by MD 30 patch 3  . meloxicam (MOBIC) 15 MG tablet Take 1 tablet (15 mg total) by mouth daily. 30 tablet 6  . meloxicam (MOBIC) 7.5 MG tablet Take 7.5 mg by mouth daily as needed.    . metoprolol tartrate (LOPRESSOR) 100 MG tablet Take 1 tablet (100 mg total) by mouth 2 (two) times daily. 60 tablet 6  . metoprolol tartrate (LOPRESSOR) 50 MG tablet Take 50 mg by mouth 2 (two) times daily.    Marland Kitchen NARCAN 4 MG/0.1ML LIQD nasal spray kit     . nitroGLYCERIN (NITROSTAT) 0.4 MG SL tablet     . NUCYNTA 50 MG tablet Take 50 mg by mouth 3 (three) times daily as needed.    Marland Kitchen olopatadine (PATANOL) 0.1 % ophthalmic solution PLACE 1 DROP INTO BOTH EYES 2 TIMES DAILY. 5 mL 2  . oxyCODONE-acetaminophen (PERCOCET) 10-325 MG tablet oxycodone-acetaminophen 10 mg-325 mg tablet  TAKE 1 TABLET BY MOUTH THREE TIMES DAILY AS NEEDED    . pantoprazole (PROTONIX) 40 MG tablet Take 40 mg by mouth every morning.    . pregabalin (LYRICA) 75 MG capsule Take 1 capsule (75 mg total) by mouth 2 (two) times daily. 180 capsule 1  . QUEtiapine  (SEROQUEL) 25 MG tablet Take 25 mg by mouth at bedtime.    . SUMAtriptan (IMITREX) 50 MG tablet TAKE 1 TABLET BY MOUTH AT THE ONSET OF A MIGRAINE. MAY REPEAT IN 2 HOURS IF HEADACHE PERSISTS OR RECURS. MAXIMUM DAILY DOSE 200 MG 9 tablet 2  . SYMBICORT 80-4.5 MCG/ACT inhaler SMARTSIG:2 Puff(s) By Mouth Twice Daily    . topiramate (TOPAMAX) 100 MG tablet TAKE 2 TABLETS (200  MG TOTAL) BY MOUTH 2 (TWO) TIMES DAILY. 120 tablet 6  . topiramate (TOPAMAX) 25 MG tablet     . traMADol (ULTRAM-ER) 200 MG 24 hr tablet Take 200 mg by mouth daily.    . traMADol (ULTRAM-ER) 300 MG 24 hr tablet Take 300 mg by mouth daily.    . traZODone (DESYREL) 300 MG tablet Take 1 tablet (300 mg total) by mouth at bedtime. 30 tablet 0  . Vitamin D, Ergocalciferol, (DRISDOL) 1.25 MG (50000 UNIT) CAPS capsule TAKE 1 CAPSULE (50,000 UNITS TOTAL) BY MOUTH ONCE A WEEK. 2 capsule 1   Current Facility-Administered Medications on File Prior to Visit  Medication Dose Route Frequency Provider Last Rate Last Admin  . lidocaine (PF) (XYLOCAINE) 1 % injection 0.3 mL  0.3 mL Other Once Magnus Sinning, MD        Allergies  Allergen Reactions  . Penicillins Anaphylaxis    Has patient had a PCN reaction causing immediate rash, facial/tongue/throat swelling, SOB or lightheadedness with hypotension: Yes Has patient had a PCN reaction causing severe rash involving mucus membranes or skin necrosis: Yes Has patient had a PCN reaction that required hospitalization Yes Has patient had a PCN reaction occurring within the last 10 years: No-more than 10 years ago If all of the above answers are "NO", then may proceed with Cephalosporin use.   Marland Kitchen Penicillins Anaphylaxis    DID THE REACTION INVOLVE: Swelling of the face/tongue/throat, SOB, or low BP? Yes Sudden or severe rash/hives, skin peeling, or the inside of the mouth or nose? Yes Did it require medical treatment? No When did it last happen? Within the past 10 years If all above answers are  "NO", may proceed with cephalosporin use.    Diona Fanti [Aspirin] Rash  . Aspirin Rash    Objective:  General: Alert and oriented x3 in no acute distress  Dermatology: Keratotic lesion present plantar foot right greater than left at the cuboid on the right and submet 5 on the left with skin lines transversing the lesion, pain is present with direct pressure to the lesion with a central nucleated core noted, no webspace macerations, no ecchymosis bilateral, all nails x 8 are mildly elongated and thickened, there is minimal nails noted to bilateral hallux from previous history of nail removal.  Vascular: Dorsalis Pedis and Posterior Tibial pedal pulses 1/4, Capillary Fill Time 5 seconds, decreased pedal hair growth bilateral, no edema bilateral lower extremities, Temperature gradient within normal limits.  Neurology: Gross sensation intact via light touch bilateral.  Subjective burning bilateral with history of neuropathy.  Musculoskeletal: Mild tenderness with palpation at the keratotic lesion site on Right>Left, Muscular strength 5/5 in all groups without pain or limitation on range of motion.  Pes planus foot deformity noted bilateral.  Assessment and Plan: Problem List Items Addressed This Visit      Nervous and Auditory   Neuropathy    Other Visit Diagnoses    Onychodystrophy    -  Primary   Plantar wart       Porokeratosis       Pain in right foot       Left foot pain          -Complete examination performed -Re-Discussed treatment options -Parred keratoic lesion using a chisel x2 blade; treated the area with Cantharone and advised on blister reaction; This is treatment # 2 to area -Fungal culture negative; microtrauma  -Patient to return to office 4-5 weeks for follow-up on keratotic lesions or sooner  if condition worsens.  Landis Martins, DPM

## 2020-08-15 ENCOUNTER — Other Ambulatory Visit: Payer: Self-pay

## 2020-08-15 ENCOUNTER — Encounter: Payer: Self-pay | Admitting: Sports Medicine

## 2020-08-15 ENCOUNTER — Ambulatory Visit (INDEPENDENT_AMBULATORY_CARE_PROVIDER_SITE_OTHER): Payer: Medicare Other | Admitting: Sports Medicine

## 2020-08-15 DIAGNOSIS — B070 Plantar wart: Secondary | ICD-10-CM | POA: Diagnosis not present

## 2020-08-15 DIAGNOSIS — G629 Polyneuropathy, unspecified: Secondary | ICD-10-CM

## 2020-08-15 DIAGNOSIS — M79672 Pain in left foot: Secondary | ICD-10-CM

## 2020-08-15 DIAGNOSIS — L603 Nail dystrophy: Secondary | ICD-10-CM

## 2020-08-15 DIAGNOSIS — Q828 Other specified congenital malformations of skin: Secondary | ICD-10-CM

## 2020-08-15 DIAGNOSIS — M79671 Pain in right foot: Secondary | ICD-10-CM

## 2020-08-15 MED ORDER — DULOXETINE HCL 20 MG PO CPEP
20.0000 mg | ORAL_CAPSULE | Freq: Every day | ORAL | 3 refills | Status: DC
Start: 2020-08-15 — End: 2021-02-21

## 2020-08-15 NOTE — Progress Notes (Signed)
Subjective: Amanda [name] is a 54 y.o. female patient who returns to office for follow up to hard skin and for foot pain. Reports that callus/wart areas are sore on R>L and states that toes are numb. Patient denies any other pedal complaints at this time.  Patient Active Problem List   Diagnosis Date Noted  . Non-ST elevation (NSTEMI) myocardial infarction ([place])   . Contusion of chest 08/26/2018  . Cardiac arrest ([place]) 08/26/2018  . HLD (hyperlipidemia) 08/26/2018  . Elevated LFTs 08/26/2018  . History of migraine   . Degenerative joint disease 07/26/2017  . Morbid obesity ([place]) 12/24/2016  . Hyperlipidemia 12/23/2016  . Painful orthopaedic hardware ([place]) 03/24/2016  . Corns and callosities 03/24/2016  . PUD (peptic ulcer disease) 01/02/2016  . Onychomycosis 01/02/2016  . Seasonal allergies 11/19/2015  . GERD (gastroesophageal reflux disease) 08/06/2015  . Asthma 08/06/2015  . Migraine 08/06/2015  . Bipolar disorder ([place]) 07/25/2015  . Neuropathy 07/08/2015  . Major depressive disorder, recurrent, severe without psychotic features ([place])   . Alcohol use disorder, severe, dependence ([place]) 11/16/2014  . Cocaine use disorder, severe, dependence ([place]) 11/16/2014  . Acute calculous cholecystitis s/p lap chole 02/09/2014 02/09/2014  . HTN (hypertension) 07/25/2013  . Carpal tunnel syndrome 11/18/2012  . Lumbar radiculopathy 11/18/2012  . Anxiety state, unspecified 11/18/2012    Current Outpatient Medications on File Prior to Visit  Medication Sig Dispense Refill  . albuterol (VENTOLIN HFA) 108 (90 Base) MCG/ACT inhaler INHALE 2 PUFFS INTO THE LUNGS EVERY 6 HOURS AS NEEDED FOR WHEEZING OR SHORTNESS OF BREATH. 18 g 3  . ALPRAZolam (XANAX) 0.25 MG tablet alprazolam 0.25 mg tablet  TAKE 1 2 TO 1 (ONE HALF TO ONE) TABLET BY MOUTH TWICE DAILY    . ALPRAZolam (XANAX) 0.5 MG tablet Take 0.5 mg by mouth daily as needed.    Marland Kitchen amLODipine (NORVASC) 5 MG tablet Take 1 tablet (5 mg total) by  mouth daily. 30 tablet 6  . atorvastatin (LIPITOR) 10 MG tablet Take 10 mg by mouth at bedtime.    Marland Kitchen atorvastatin (LIPITOR) 40 MG tablet Take 1 tablet (40 mg total) by mouth daily. (Patient taking differently: Take 40 mg by mouth daily. ) 30 tablet 6  . baclofen (LIORESAL) 10 MG tablet Take 10 mg by mouth 3 (three) times daily as needed.    Marland Kitchen buPROPion (WELLBUTRIN SR) 100 MG 12 hr tablet Take 100 mg by mouth every morning.    Marland Kitchen buPROPion (WELLBUTRIN SR) 150 MG 12 hr tablet Take 150 mg by mouth 2 (two) times daily.    Marland Kitchen buPROPion (WELLBUTRIN XL) 300 MG 24 hr tablet Take 300 mg by mouth every morning.    . cetirizine (ZYRTEC) 10 MG tablet Take 1 tablet (10 mg total) by mouth daily. 30 tablet 5  . Cholecalciferol 125 MCG (5000 UT) TABS Take 1 tablet by mouth 2 times a day    . citalopram (CELEXA) 10 MG tablet Take 10 mg by mouth daily.    . citalopram (CELEXA) 20 MG tablet Take 20 mg by mouth daily.    . cloNIDine (CATAPRES) 0.1 MG tablet Take 1 tablet (0.1 mg total) by mouth at bedtime as needed. For hot flashes 30 tablet 3  . colestipol (COLESTID) 1 g tablet Take 1 tablet (1 g total) by mouth every morning. 30 tablet 4  . cyclobenzaprine (FLEXERIL) 10 MG tablet Take 1 tablet (10 mg total) by mouth 2 (two) times daily. prn 60 tablet 6  . diclofenac  Sodium (VOLTAREN) 1 % GEL SMARTSIG:4 Gram(s) Topical Every 6 Hours PRN    . divalproex (DEPAKOTE) 500 MG DR tablet Take 500 mg by mouth 3 (three) times daily.    Marland Kitchen doxycycline (VIBRA-TABS) 100 MG tablet doxycycline hyclate 100 mg tablet  TAKE 1 TABLET BY MOUTH TWICE DAILY FOR 10 DAYS    . esomeprazole (NEXIUM) 40 MG capsule Take 1 capsule (40 mg total) by mouth daily. 30 capsule 6  . fluticasone (FLONASE) 50 MCG/ACT nasal spray PLACE 2 SPRAYS INTO BOTH NOSTRILS DAILY. 16 g 1  . gabapentin (NEURONTIN) 100 MG capsule     . gabapentin (NEURONTIN) 800 MG tablet gabapentin 800 mg tablet    . haloperidol (HALDOL) 1 MG tablet Take by mouth.    . haloperidol  (HALDOL) 2 MG tablet Take 2 mg by mouth 2 (two) times daily.    . haloperidol (HALDOL) 5 MG tablet Take 1 tablet by mouth daily.  0  . hydrochlorothiazide (HYDRODIURIL) 25 MG tablet Take 1 tablet (25 mg total) by mouth daily. 30 tablet 6  . HYDROcodone-acetaminophen (NORCO/VICODIN) 5-325 MG tablet Take 1 tablet by mouth 4 (four) times daily as needed.    . hydrOXYzine (ATARAX/VISTARIL) 50 MG tablet hydroxyzine HCl 50 mg tablet  TAKE 1 TABLET BY MOUTH 4 TIMES DAILY    . ibuprofen (ADVIL) 800 MG tablet Take 800 mg by mouth 4 (four) times daily as needed.    Marland Kitchen ipratropium (ATROVENT) 0.03 % nasal spray Place 2 sprays into both nostrils 2 (two) times daily.    . isosorbide mononitrate (IMDUR) 30 MG 24 hr tablet Take 1 tablet (30 mg total) by mouth daily. 90 tablet 3  . levofloxacin (LEVAQUIN) 750 MG tablet levofloxacin 750 mg tablet    . lidocaine (LIDODERM) 5 % Place 1 patch onto the skin daily. Remove & Discard patch within 12 hours or as directed by MD 30 patch 3  . meloxicam (MOBIC) 15 MG tablet Take 1 tablet (15 mg total) by mouth daily. 30 tablet 6  . meloxicam (MOBIC) 7.5 MG tablet Take 7.5 mg by mouth daily as needed.    . metoprolol tartrate (LOPRESSOR) 100 MG tablet Take 1 tablet (100 mg total) by mouth 2 (two) times daily. 60 tablet 6  . metoprolol tartrate (LOPRESSOR) 50 MG tablet Take 50 mg by mouth 2 (two) times daily.    Marland Kitchen NARCAN 4 MG/0.1ML LIQD nasal spray kit     . nitroGLYCERIN (NITROSTAT) 0.4 MG SL tablet     . NUCYNTA 50 MG tablet Take 50 mg by mouth 3 (three) times daily as needed.    Marland Kitchen olopatadine (PATANOL) 0.1 % ophthalmic solution PLACE 1 DROP INTO BOTH EYES 2 TIMES DAILY. 5 mL 2  . oxyCODONE-acetaminophen (PERCOCET) 10-325 MG tablet oxycodone-acetaminophen 10 mg-325 mg tablet  TAKE 1 TABLET BY MOUTH THREE TIMES DAILY AS NEEDED    . pantoprazole (PROTONIX) 40 MG tablet Take 40 mg by mouth every morning.    . pregabalin (LYRICA) 75 MG capsule Take 1 capsule (75 mg total) by  mouth 2 (two) times daily. 180 capsule 1  . QUEtiapine (SEROQUEL) 25 MG tablet Take 25 mg by mouth at bedtime.    . SUMAtriptan (IMITREX) 50 MG tablet TAKE 1 TABLET BY MOUTH AT THE ONSET OF A MIGRAINE. MAY REPEAT IN 2 HOURS IF HEADACHE PERSISTS OR RECURS. MAXIMUM DAILY DOSE 200 MG 9 tablet 2  . SYMBICORT 80-4.5 MCG/ACT inhaler SMARTSIG:2 Puff(s) By Mouth Twice Daily    .  topiramate (TOPAMAX) 100 MG tablet TAKE 2 TABLETS (200 MG TOTAL) BY MOUTH 2 (TWO) TIMES DAILY. 120 tablet 6  . topiramate (TOPAMAX) 25 MG tablet     . traMADol (ULTRAM-ER) 200 MG 24 hr tablet Take 200 mg by mouth daily.    . traMADol (ULTRAM-ER) 300 MG 24 hr tablet Take 300 mg by mouth daily.    . traZODone (DESYREL) 300 MG tablet Take 1 tablet (300 mg total) by mouth at bedtime. 30 tablet 0  . urea (GORDONS UREA) 40 % ointment Apply topically as needed. To nails and dry skin once daily 30 g 0  . Vitamin D, Ergocalciferol, (DRISDOL) 1.25 MG (50000 UNIT) CAPS capsule TAKE 1 CAPSULE (50,000 UNITS TOTAL) BY MOUTH ONCE A WEEK. 2 capsule 1   Current Facility-Administered Medications on File Prior to Visit  Medication Dose Route Frequency Provider Last Rate Last Admin  . lidocaine (PF) (XYLOCAINE) 1 % injection 0.3 mL  0.3 mL Other Once Magnus Sinning, MD        Allergies  Allergen Reactions  . Penicillins Anaphylaxis    Has patient had a PCN reaction causing immediate rash, facial/tongue/throat swelling, SOB or lightheadedness with hypotension: Yes Has patient had a PCN reaction causing severe rash involving mucus membranes or skin necrosis: Yes Has patient had a PCN reaction that required hospitalization Yes Has patient had a PCN reaction occurring within the last 10 years: No-more than 10 years ago If all of the above answers are "NO", then may proceed with Cephalosporin use.   Marland Kitchen Penicillins Anaphylaxis    DID THE REACTION INVOLVE: Swelling of the face/tongue/throat, SOB, or low BP? Yes Sudden or severe rash/hives, skin  peeling, or the inside of the mouth or nose? Yes Did it require medical treatment? No When did it last happen? Within the past 10 years If all above answers are "NO", may proceed with cephalosporin use.    Diona Fanti [Aspirin] Rash  . Aspirin Rash    Objective:  General: Alert and oriented x3 in no acute distress  Dermatology: Keratotic lesion present plantar foot right greater than left at the cuboid on the right and submet 5 on the left with skin lines transversing the lesion, pain is present with direct pressure to the lesion with a central nucleated core noted, that appears to be improving, no webspace macerations, no ecchymosis bilateral, all nails x 8 are mildly elongated and thickened, there is minimal nails noted to bilateral hallux from previous history of nail removal.  Vascular: Dorsalis Pedis and Posterior Tibial pedal pulses 1/4, Capillary Fill Time 5 seconds, decreased pedal hair growth bilateral, no edema bilateral lower extremities, Temperature gradient within normal limits.  Neurology: Gross sensation intact via light touch bilateral.  Subjective burning bilateral with history of neuropathy previous nerve studies +.  Musculoskeletal: Mild tenderness with palpation at the keratotic lesion site on Right>Left, Muscular strength 5/5 in all groups without pain or limitation on range of motion.  Pes planus foot deformity noted bilateral.  Assessment and Plan: Problem List Items Addressed This Visit      Nervous and Auditory   Neuropathy    Other Visit Diagnoses    Onychodystrophy    -  Primary   Plantar wart       Porokeratosis       Pain in right foot       Left foot pain          -Complete examination performed -Re-Discussed treatment options -Parred keratoic lesion using  a chisel x2 blade; treated the area with Cantharone and advised on blister reaction; This is treatment # 3 to area -Rx Cymbalta for increased nerve pain to toes -Patient to return to office 4-5 weeks  for follow-up on keratotic lesions or sooner if condition worsens.  Landis Martins, DPM

## 2020-08-19 ENCOUNTER — Ambulatory Visit: Payer: Medicaid Other | Admitting: Podiatry

## 2020-08-30 ENCOUNTER — Ambulatory Visit: Payer: Medicare Other | Attending: Internal Medicine

## 2020-08-30 DIAGNOSIS — Z23 Encounter for immunization: Secondary | ICD-10-CM

## 2020-08-30 NOTE — Progress Notes (Signed)
   Covid-[facility] Vaccination [facility]  Name:  Amanda [name]    MRN: [id number] DOB: 1966/06/18  08/30/2020  [name] was observed post Covid-19 immunization for 15 minutes without incident. She was provided with Vaccine Information Sheet and instruction to access the V-Safe system.   [name] was instructed to call 911 with any severe reactions post vaccine: Marland Kitchen Difficulty breathing  . Swelling of face and throat  . A fast heartbeat  . A bad rash all over body  . Dizziness and weakness   Immunizations Administered    Name Date Dose VIS Date Route   Moderna Covid-19 Booster Vaccine 08/30/2020  3:19 PM 0.25 mL 06/12/2020 Intramuscular   Manufacturer: Gala Murdoch   Lot: [id number]   NDC: 14709-295-74

## 2020-09-19 ENCOUNTER — Encounter: Payer: Self-pay | Admitting: Sports Medicine

## 2020-09-19 ENCOUNTER — Other Ambulatory Visit: Payer: Self-pay

## 2020-09-19 ENCOUNTER — Ambulatory Visit (INDEPENDENT_AMBULATORY_CARE_PROVIDER_SITE_OTHER): Payer: Medicare Other | Admitting: Sports Medicine

## 2020-09-19 DIAGNOSIS — B070 Plantar wart: Secondary | ICD-10-CM

## 2020-09-19 DIAGNOSIS — Q828 Other specified congenital malformations of skin: Secondary | ICD-10-CM

## 2020-09-19 DIAGNOSIS — L603 Nail dystrophy: Secondary | ICD-10-CM

## 2020-09-19 DIAGNOSIS — M79672 Pain in left foot: Secondary | ICD-10-CM

## 2020-09-19 DIAGNOSIS — G629 Polyneuropathy, unspecified: Secondary | ICD-10-CM

## 2020-09-19 DIAGNOSIS — M79671 Pain in right foot: Secondary | ICD-10-CM

## 2020-09-19 NOTE — Progress Notes (Signed)
Subjective: Amanda [name] is a 55 y.o. female patient who returns to office for follow up to hard skin and for foot pain. Reports that callus/wart areas are doing better. States that her toes are still numb and tingle and is not sure if the medication I gave has helped. Patient denies any other pedal complaints at this time.  Patient Active Problem List   Diagnosis Date Noted  . Non-ST elevation (NSTEMI) myocardial infarction ([place])   . Contusion of chest 08/26/2018  . Cardiac arrest ([place]) 08/26/2018  . HLD (hyperlipidemia) 08/26/2018  . Elevated LFTs 08/26/2018  . History of migraine   . Degenerative joint disease 07/26/2017  . Morbid obesity ([place]) 12/24/2016  . Hyperlipidemia 12/23/2016  . Painful orthopaedic hardware ([place]) 03/24/2016  . Corns and callosities 03/24/2016  . PUD (peptic ulcer disease) 01/02/2016  . Onychomycosis 01/02/2016  . Seasonal allergies 11/19/2015  . GERD (gastroesophageal reflux disease) 08/06/2015  . Asthma 08/06/2015  . Migraine 08/06/2015  . Bipolar disorder ([place]) 07/25/2015  . Neuropathy 07/08/2015  . Major depressive disorder, recurrent, severe without psychotic features ([place])   . Alcohol use disorder, severe, dependence ([place]) 11/16/2014  . Cocaine use disorder, severe, dependence ([place]) 11/16/2014  . Acute calculous cholecystitis s/p lap chole 02/09/2014 02/09/2014  . HTN (hypertension) 07/25/2013  . Carpal tunnel syndrome 11/18/2012  . Lumbar radiculopathy 11/18/2012  . Anxiety state, unspecified 11/18/2012    Current Outpatient Medications on File Prior to Visit  Medication Sig Dispense Refill  . albuterol (VENTOLIN HFA) 108 (90 Base) MCG/ACT inhaler INHALE 2 PUFFS INTO THE LUNGS EVERY 6 HOURS AS NEEDED FOR WHEEZING OR SHORTNESS OF BREATH. 18 g 3  . ALPRAZolam (XANAX) 0.25 MG tablet alprazolam 0.25 mg tablet  TAKE 1 2 TO 1 (ONE HALF TO ONE) TABLET BY MOUTH TWICE DAILY    . ALPRAZolam (XANAX) 0.5 MG tablet Take 0.5 mg by mouth daily as needed.     Marland Kitchen amLODipine (NORVASC) 5 MG tablet Take 1 tablet (5 mg total) by mouth daily. 30 tablet 6  . atorvastatin (LIPITOR) 10 MG tablet Take 10 mg by mouth at bedtime.    Marland Kitchen atorvastatin (LIPITOR) 40 MG tablet Take 1 tablet (40 mg total) by mouth daily. (Patient taking differently: Take 40 mg by mouth daily. ) 30 tablet 6  . baclofen (LIORESAL) 10 MG tablet Take 10 mg by mouth 3 (three) times daily as needed.    Marland Kitchen buPROPion (WELLBUTRIN SR) 100 MG 12 hr tablet Take 100 mg by mouth every morning.    Marland Kitchen buPROPion (WELLBUTRIN SR) 150 MG 12 hr tablet Take 150 mg by mouth 2 (two) times daily.    Marland Kitchen buPROPion (WELLBUTRIN XL) 300 MG 24 hr tablet Take 300 mg by mouth every morning.    . cetirizine (ZYRTEC) 10 MG tablet Take 1 tablet (10 mg total) by mouth daily. 30 tablet 5  . Cholecalciferol 125 MCG (5000 UT) TABS Take 1 tablet by mouth 2 times a day    . citalopram (CELEXA) 10 MG tablet Take 10 mg by mouth daily.    . citalopram (CELEXA) 20 MG tablet Take 20 mg by mouth daily.    . cloNIDine (CATAPRES) 0.1 MG tablet Take 1 tablet (0.1 mg total) by mouth at bedtime as needed. For hot flashes 30 tablet 3  . colestipol (COLESTID) 1 g tablet Take 1 tablet (1 g total) by mouth every morning. 30 tablet 4  . cyclobenzaprine (FLEXERIL) 10 MG tablet Take 1 tablet (10 mg total)  by mouth 2 (two) times daily. prn 60 tablet 6  . diclofenac Sodium (VOLTAREN) 1 % GEL SMARTSIG:4 Gram(s) Topical Every 6 Hours PRN    . divalproex (DEPAKOTE) 500 MG DR tablet Take 500 mg by mouth 3 (three) times daily.    Marland Kitchen doxycycline (VIBRA-TABS) 100 MG tablet doxycycline hyclate 100 mg tablet  TAKE 1 TABLET BY MOUTH TWICE DAILY FOR 10 DAYS    . DULoxetine (CYMBALTA) 20 MG capsule Take 1 capsule (20 mg total) by mouth daily. 30 capsule 3  . esomeprazole (NEXIUM) 40 MG capsule Take 1 capsule (40 mg total) by mouth daily. 30 capsule 6  . fluticasone (FLONASE) 50 MCG/ACT nasal spray PLACE 2 SPRAYS INTO BOTH NOSTRILS DAILY. 16 g 1  . gabapentin  (NEURONTIN) 100 MG capsule     . gabapentin (NEURONTIN) 800 MG tablet gabapentin 800 mg tablet    . haloperidol (HALDOL) 1 MG tablet Take by mouth.    . haloperidol (HALDOL) 2 MG tablet Take 2 mg by mouth 2 (two) times daily.    . haloperidol (HALDOL) 5 MG tablet Take 1 tablet by mouth daily.  0  . hydrochlorothiazide (HYDRODIURIL) 25 MG tablet Take 1 tablet (25 mg total) by mouth daily. 30 tablet 6  . HYDROcodone-acetaminophen (NORCO/VICODIN) 5-325 MG tablet Take 1 tablet by mouth 4 (four) times daily as needed.    . hydrOXYzine (ATARAX/VISTARIL) 50 MG tablet hydroxyzine HCl 50 mg tablet  TAKE 1 TABLET BY MOUTH 4 TIMES DAILY    . ibuprofen (ADVIL) 800 MG tablet Take 800 mg by mouth 4 (four) times daily as needed.    Marland Kitchen ipratropium (ATROVENT) 0.03 % nasal spray Place 2 sprays into both nostrils 2 (two) times daily.    . isosorbide mononitrate (IMDUR) 30 MG 24 hr tablet Take 1 tablet (30 mg total) by mouth daily. 90 tablet 3  . levofloxacin (LEVAQUIN) 750 MG tablet levofloxacin 750 mg tablet    . lidocaine (LIDODERM) 5 % Place 1 patch onto the skin daily. Remove & Discard patch within 12 hours or as directed by MD 30 patch 3  . meloxicam (MOBIC) 15 MG tablet Take 1 tablet (15 mg total) by mouth daily. 30 tablet 6  . meloxicam (MOBIC) 7.5 MG tablet Take 7.5 mg by mouth daily as needed.    . metoprolol tartrate (LOPRESSOR) 100 MG tablet Take 1 tablet (100 mg total) by mouth 2 (two) times daily. 60 tablet 6  . metoprolol tartrate (LOPRESSOR) 50 MG tablet Take 50 mg by mouth 2 (two) times daily.    Marland Kitchen NARCAN 4 MG/0.1ML LIQD nasal spray kit     . nitroGLYCERIN (NITROSTAT) 0.4 MG SL tablet     . NUCYNTA 50 MG tablet Take 50 mg by mouth 3 (three) times daily as needed.    Marland Kitchen olopatadine (PATANOL) 0.1 % ophthalmic solution PLACE 1 DROP INTO BOTH EYES 2 TIMES DAILY. 5 mL 2  . oxyCODONE-acetaminophen (PERCOCET) 10-325 MG tablet oxycodone-acetaminophen 10 mg-325 mg tablet  TAKE 1 TABLET BY MOUTH THREE TIMES  DAILY AS NEEDED    . pantoprazole (PROTONIX) 40 MG tablet Take 40 mg by mouth every morning.    . pregabalin (LYRICA) 75 MG capsule Take 1 capsule (75 mg total) by mouth 2 (two) times daily. 180 capsule 1  . QUEtiapine (SEROQUEL) 25 MG tablet Take 25 mg by mouth at bedtime.    . SUMAtriptan (IMITREX) 50 MG tablet TAKE 1 TABLET BY MOUTH AT THE ONSET OF A MIGRAINE. MAY  REPEAT IN 2 HOURS IF HEADACHE PERSISTS OR RECURS. MAXIMUM DAILY DOSE 200 MG 9 tablet 2  . SYMBICORT 80-4.5 MCG/ACT inhaler SMARTSIG:2 Puff(s) By Mouth Twice Daily    . topiramate (TOPAMAX) 100 MG tablet TAKE 2 TABLETS (200 MG TOTAL) BY MOUTH 2 (TWO) TIMES DAILY. 120 tablet 6  . topiramate (TOPAMAX) 25 MG tablet     . traMADol (ULTRAM-ER) 200 MG 24 hr tablet Take 200 mg by mouth daily.    . traMADol (ULTRAM-ER) 300 MG 24 hr tablet Take 300 mg by mouth daily.    . traZODone (DESYREL) 300 MG tablet Take 1 tablet (300 mg total) by mouth at bedtime. 30 tablet 0  . urea (GORDONS UREA) 40 % ointment Apply topically as needed. To nails and dry skin once daily 30 g 0  . Vitamin D, Ergocalciferol, (DRISDOL) 1.25 MG (50000 UNIT) CAPS capsule TAKE 1 CAPSULE (50,000 UNITS TOTAL) BY MOUTH ONCE A WEEK. 2 capsule 1   Current Facility-Administered Medications on File Prior to Visit  Medication Dose Route Frequency Provider Last Rate Last Admin  . lidocaine (PF) (XYLOCAINE) 1 % injection 0.3 mL  0.3 mL Other Once Magnus Sinning, MD        Allergies  Allergen Reactions  . Penicillins Anaphylaxis    Has patient had a PCN reaction causing immediate rash, facial/tongue/throat swelling, SOB or lightheadedness with hypotension: Yes Has patient had a PCN reaction causing severe rash involving mucus membranes or skin necrosis: Yes Has patient had a PCN reaction that required hospitalization Yes Has patient had a PCN reaction occurring within the last 10 years: No-more than 10 years ago If all of the above answers are "NO", then may proceed with  Cephalosporin use.   Marland Kitchen Penicillins Anaphylaxis    DID THE REACTION INVOLVE: Swelling of the face/tongue/throat, SOB, or low BP? Yes Sudden or severe rash/hives, skin peeling, or the inside of the mouth or nose? Yes Did it require medical treatment? No When did it last happen? Within the past 10 years If all above answers are "NO", may proceed with cephalosporin use.    Diona Fanti [Aspirin] Rash  . Aspirin Rash    Objective:  General: Alert and oriented x3 in no acute distress  Dermatology: Keratotic lesion present plantar foot right greater than left at the cuboid on the right and submet 5 on the left with skin lines transversing the lesion, pain is present with direct pressure to the lesion with a central nucleated core noted, that appears to be improving, no webspace macerations, no ecchymosis bilateral, all nails x 8 are mildly elongated and thickened, there is minimal nails noted to bilateral hallux from previous history of nail removal.  Vascular: Dorsalis Pedis and Posterior Tibial pedal pulses 1/4, Capillary Fill Time 5 seconds, decreased pedal hair growth bilateral, no edema bilateral lower extremities, Temperature gradient within normal limits.  Neurology: Gross sensation intact via light touch bilateral.  Subjective burning bilateral with history of neuropathy previous nerve studies +.  Musculoskeletal: Mild tenderness with palpation at the keratotic lesion site on Right>Left, Muscular strength 5/5 in all groups without pain or limitation on range of motion.  Pes planus foot deformity noted bilateral.  Assessment and Plan: Problem List Items Addressed This Visit      Nervous and Auditory   Neuropathy    Other Visit Diagnoses    Plantar wart    -  Primary   Porokeratosis       Onychodystrophy  Pain in right foot       Left foot pain          -Complete examination performed -Re-Discussed treatment options -Parred keratoic lesion using a chisel x2 blade; treated the  area with Cantharone and advised on blister reaction; This is treatment # 4 to area -Continue with Cymbalta for increased nerve pain to toes and advised patient if does not work should discuss with pain doctor or PCP -Patient to return to office 4-6  weeks for follow-up on keratotic lesions or sooner if condition worsens.  Landis Martins, DPM

## 2020-10-10 ENCOUNTER — Ambulatory Visit: Payer: Medicare Other | Attending: Nurse Practitioner | Admitting: Physical Therapy

## 2020-10-17 ENCOUNTER — Ambulatory Visit (INDEPENDENT_AMBULATORY_CARE_PROVIDER_SITE_OTHER): Payer: Medicare Other | Admitting: Sports Medicine

## 2020-10-17 ENCOUNTER — Other Ambulatory Visit: Payer: Self-pay

## 2020-10-17 ENCOUNTER — Encounter: Payer: Self-pay | Admitting: Sports Medicine

## 2020-10-17 DIAGNOSIS — B070 Plantar wart: Secondary | ICD-10-CM

## 2020-10-17 DIAGNOSIS — G629 Polyneuropathy, unspecified: Secondary | ICD-10-CM

## 2020-10-17 DIAGNOSIS — Q828 Other specified congenital malformations of skin: Secondary | ICD-10-CM | POA: Diagnosis not present

## 2020-10-17 NOTE — Progress Notes (Signed)
Subjective: Amanda [name] is a 55 y.o. female patient who returns to office for follow up to hard skin and for foot pain. Reports that callus/wart areas are doing better. States that her toes are throbbing a little more today. Patient denies any other pedal complaints at this time.  Patient Active Problem List   Diagnosis Date Noted  . Non-ST elevation (NSTEMI) myocardial infarction ([place])   . Contusion of chest 08/26/2018  . Cardiac arrest ([place]) 08/26/2018  . HLD (hyperlipidemia) 08/26/2018  . Elevated LFTs 08/26/2018  . History of migraine   . Degenerative joint disease 07/26/2017  . Morbid obesity ([place]) 12/24/2016  . Hyperlipidemia 12/23/2016  . Painful orthopaedic hardware ([place]) 03/24/2016  . Corns and callosities 03/24/2016  . PUD (peptic ulcer disease) 01/02/2016  . Onychomycosis 01/02/2016  . Seasonal allergies 11/19/2015  . GERD (gastroesophageal reflux disease) 08/06/2015  . Asthma 08/06/2015  . Migraine 08/06/2015  . Bipolar disorder ([place]) 07/25/2015  . Neuropathy 07/08/2015  . Major depressive disorder, recurrent, severe without psychotic features ([place])   . Alcohol use disorder, severe, dependence ([place]) 11/16/2014  . Cocaine use disorder, severe, dependence ([place]) 11/16/2014  . Acute calculous cholecystitis s/p lap chole 02/09/2014 02/09/2014  . HTN (hypertension) 07/25/2013  . Carpal tunnel syndrome 11/18/2012  . Lumbar radiculopathy 11/18/2012  . Anxiety state, unspecified 11/18/2012    Current Outpatient Medications on File Prior to Visit  Medication Sig Dispense Refill  . albuterol (VENTOLIN HFA) 108 (90 Base) MCG/ACT inhaler INHALE 2 PUFFS INTO THE LUNGS EVERY 6 HOURS AS NEEDED FOR WHEEZING OR SHORTNESS OF BREATH. 18 g 3  . ALPRAZolam (XANAX) 0.25 MG tablet alprazolam 0.25 mg tablet  TAKE 1 2 TO 1 (ONE HALF TO ONE) TABLET BY MOUTH TWICE DAILY    . ALPRAZolam (XANAX) 0.5 MG tablet Take 0.5 mg by mouth daily as needed.    Marland Kitchen amLODipine (NORVASC) 5 MG tablet Take  1 tablet (5 mg total) by mouth daily. 30 tablet 6  . atorvastatin (LIPITOR) 10 MG tablet Take 10 mg by mouth at bedtime.    Marland Kitchen atorvastatin (LIPITOR) 40 MG tablet Take 1 tablet (40 mg total) by mouth daily. (Patient taking differently: Take 40 mg by mouth daily. ) 30 tablet 6  . baclofen (LIORESAL) 10 MG tablet Take 10 mg by mouth 3 (three) times daily as needed.    Marland Kitchen buPROPion (WELLBUTRIN SR) 100 MG 12 hr tablet Take 100 mg by mouth every morning.    Marland Kitchen buPROPion (WELLBUTRIN SR) 150 MG 12 hr tablet Take 150 mg by mouth 2 (two) times daily.    Marland Kitchen buPROPion (WELLBUTRIN XL) 300 MG 24 hr tablet Take 300 mg by mouth every morning.    . cetirizine (ZYRTEC) 10 MG tablet Take 1 tablet (10 mg total) by mouth daily. 30 tablet 5  . Cholecalciferol 125 MCG (5000 UT) TABS Take 1 tablet by mouth 2 times a day    . citalopram (CELEXA) 10 MG tablet Take 10 mg by mouth daily.    . citalopram (CELEXA) 20 MG tablet Take 20 mg by mouth daily.    . cloNIDine (CATAPRES) 0.1 MG tablet Take 1 tablet (0.1 mg total) by mouth at bedtime as needed. For hot flashes 30 tablet 3  . colestipol (COLESTID) 1 g tablet Take 1 tablet (1 g total) by mouth every morning. 30 tablet 4  . cyclobenzaprine (FLEXERIL) 10 MG tablet Take 1 tablet (10 mg total) by mouth 2 (two) times daily. prn 60 tablet 6  .  diclofenac Sodium (VOLTAREN) 1 % GEL SMARTSIG:4 Gram(s) Topical Every 6 Hours PRN    . divalproex (DEPAKOTE) 500 MG DR tablet Take 500 mg by mouth 3 (three) times daily.    Marland Kitchen doxycycline (VIBRA-TABS) 100 MG tablet doxycycline hyclate 100 mg tablet  TAKE 1 TABLET BY MOUTH TWICE DAILY FOR 10 DAYS    . DULoxetine (CYMBALTA) 20 MG capsule Take 1 capsule (20 mg total) by mouth daily. 30 capsule 3  . esomeprazole (NEXIUM) 40 MG capsule Take 1 capsule (40 mg total) by mouth daily. 30 capsule 6  . fluticasone (FLONASE) 50 MCG/ACT nasal spray PLACE 2 SPRAYS INTO BOTH NOSTRILS DAILY. 16 g 1  . gabapentin (NEURONTIN) 100 MG capsule     . gabapentin  (NEURONTIN) 800 MG tablet gabapentin 800 mg tablet    . haloperidol (HALDOL) 1 MG tablet Take by mouth.    . haloperidol (HALDOL) 2 MG tablet Take 2 mg by mouth 2 (two) times daily.    . haloperidol (HALDOL) 5 MG tablet Take 1 tablet by mouth daily.  0  . hydrochlorothiazide (HYDRODIURIL) 25 MG tablet Take 1 tablet (25 mg total) by mouth daily. 30 tablet 6  . HYDROcodone-acetaminophen (NORCO/VICODIN) 5-325 MG tablet Take 1 tablet by mouth 4 (four) times daily as needed.    . hydrOXYzine (ATARAX/VISTARIL) 50 MG tablet hydroxyzine HCl 50 mg tablet  TAKE 1 TABLET BY MOUTH 4 TIMES DAILY    . ibuprofen (ADVIL) 800 MG tablet Take 800 mg by mouth 4 (four) times daily as needed.    Marland Kitchen ipratropium (ATROVENT) 0.03 % nasal spray Place 2 sprays into both nostrils 2 (two) times daily.    . isosorbide mononitrate (IMDUR) 30 MG 24 hr tablet Take 1 tablet (30 mg total) by mouth daily. 90 tablet 3  . levofloxacin (LEVAQUIN) 750 MG tablet levofloxacin 750 mg tablet    . lidocaine (LIDODERM) 5 % Place 1 patch onto the skin daily. Remove & Discard patch within 12 hours or as directed by MD 30 patch 3  . meloxicam (MOBIC) 15 MG tablet Take 1 tablet (15 mg total) by mouth daily. 30 tablet 6  . meloxicam (MOBIC) 7.5 MG tablet Take 7.5 mg by mouth daily as needed.    . metoprolol tartrate (LOPRESSOR) 100 MG tablet Take 1 tablet (100 mg total) by mouth 2 (two) times daily. 60 tablet 6  . metoprolol tartrate (LOPRESSOR) 50 MG tablet Take 50 mg by mouth 2 (two) times daily.    Marland Kitchen NARCAN 4 MG/0.1ML LIQD nasal spray kit     . nitroGLYCERIN (NITROSTAT) 0.4 MG SL tablet     . NUCYNTA 50 MG tablet Take 50 mg by mouth 3 (three) times daily as needed.    Marland Kitchen olopatadine (PATANOL) 0.1 % ophthalmic solution PLACE 1 DROP INTO BOTH EYES 2 TIMES DAILY. 5 mL 2  . oxyCODONE-acetaminophen (PERCOCET) 10-325 MG tablet oxycodone-acetaminophen 10 mg-325 mg tablet  TAKE 1 TABLET BY MOUTH THREE TIMES DAILY AS NEEDED    . pantoprazole (PROTONIX)  40 MG tablet Take 40 mg by mouth every morning.    . pregabalin (LYRICA) 75 MG capsule Take 1 capsule (75 mg total) by mouth 2 (two) times daily. 180 capsule 1  . QUEtiapine (SEROQUEL) 25 MG tablet Take 25 mg by mouth at bedtime.    . SUMAtriptan (IMITREX) 50 MG tablet TAKE 1 TABLET BY MOUTH AT THE ONSET OF A MIGRAINE. MAY REPEAT IN 2 HOURS IF HEADACHE PERSISTS OR RECURS. MAXIMUM DAILY DOSE  200 MG 9 tablet 2  . SYMBICORT 80-4.5 MCG/ACT inhaler SMARTSIG:2 Puff(s) By Mouth Twice Daily    . topiramate (TOPAMAX) 100 MG tablet TAKE 2 TABLETS (200 MG TOTAL) BY MOUTH 2 (TWO) TIMES DAILY. 120 tablet 6  . topiramate (TOPAMAX) 25 MG tablet     . traMADol (ULTRAM-ER) 200 MG 24 hr tablet Take 200 mg by mouth daily.    . traMADol (ULTRAM-ER) 300 MG 24 hr tablet Take 300 mg by mouth daily.    . traZODone (DESYREL) 300 MG tablet Take 1 tablet (300 mg total) by mouth at bedtime. 30 tablet 0  . urea (GORDONS UREA) 40 % ointment Apply topically as needed. To nails and dry skin once daily 30 g 0  . Vitamin D, Ergocalciferol, (DRISDOL) 1.25 MG (50000 UNIT) CAPS capsule TAKE 1 CAPSULE (50,000 UNITS TOTAL) BY MOUTH ONCE A WEEK. 2 capsule 1   Current Facility-Administered Medications on File Prior to Visit  Medication Dose Route Frequency Provider Last Rate Last Admin  . lidocaine (PF) (XYLOCAINE) 1 % injection 0.3 mL  0.3 mL Other Once Magnus Sinning, MD        Allergies  Allergen Reactions  . Penicillins Anaphylaxis    Has patient had a PCN reaction causing immediate rash, facial/tongue/throat swelling, SOB or lightheadedness with hypotension: Yes Has patient had a PCN reaction causing severe rash involving mucus membranes or skin necrosis: Yes Has patient had a PCN reaction that required hospitalization Yes Has patient had a PCN reaction occurring within the last 10 years: No-more than 10 years ago If all of the above answers are "NO", then may proceed with Cephalosporin use.   Marland Kitchen Penicillins Anaphylaxis     DID THE REACTION INVOLVE: Swelling of the face/tongue/throat, SOB, or low BP? Yes Sudden or severe rash/hives, skin peeling, or the inside of the mouth or nose? Yes Did it require medical treatment? No When did it last happen? Within the past 10 years If all above answers are "NO", may proceed with cephalosporin use.    Diona Fanti [Aspirin] Rash  . Aspirin Rash    Objective:  General: Alert and oriented x3 in no acute distress  Dermatology: Keratotic lesion present plantar foot right greater than left at the cuboid on the right and submet 5 on the left with skin lines transversing the lesion, pain is present with direct pressure to the lesion with a central nucleated core noted, that appears to be improving, no webspace macerations, no ecchymosis bilateral, all nails x 8 are mildly elongated and thickened, there is minimal nails noted to bilateral hallux from previous history of nail removal.  Vascular: Dorsalis Pedis and Posterior Tibial pedal pulses 1/4, Capillary Fill Time 5 seconds, decreased pedal hair growth bilateral, no edema bilateral lower extremities, Temperature gradient within normal limits.  Neurology: Gross sensation intact via light touch bilateral.  Subjective burning bilateral with history of neuropathy previous nerve studies +.  Musculoskeletal: Mild tenderness with palpation at the keratotic lesion site on Right>Left, Muscular strength 5/5 in all groups without pain or limitation on range of motion.  Pes planus foot deformity noted bilateral.  Assessment and Plan: Problem List Items Addressed This Visit      Nervous and Auditory   Neuropathy    Other Visit Diagnoses    Plantar wart    -  Primary   Porokeratosis          -Complete examination performed -Re-Discussed treatment options -Parred keratoic lesion using a chisel x2 blade; treated  the area with Cantharone and advised on blister reaction; This is treatment # 5 to area -Continue with Cymbalta for increased  nerve pain to toes and advised patient if does not work should discuss with pain doctor or PCP like before -Dispensed foot miracle sample -Patient to return to office 4-6  weeks for follow-up on keratotic lesions or sooner if condition worsens.  Landis Martins, DPM

## 2020-11-21 ENCOUNTER — Ambulatory Visit: Payer: Medicare Other | Admitting: Sports Medicine

## 2020-12-19 ENCOUNTER — Other Ambulatory Visit: Payer: Self-pay

## 2020-12-19 ENCOUNTER — Encounter: Payer: Self-pay | Admitting: Sports Medicine

## 2020-12-19 ENCOUNTER — Ambulatory Visit (INDEPENDENT_AMBULATORY_CARE_PROVIDER_SITE_OTHER): Payer: Medicare Other | Admitting: Sports Medicine

## 2020-12-19 DIAGNOSIS — R194 Change in bowel habit: Secondary | ICD-10-CM | POA: Insufficient documentation

## 2020-12-19 DIAGNOSIS — Z1211 Encounter for screening for malignant neoplasm of colon: Secondary | ICD-10-CM | POA: Insufficient documentation

## 2020-12-19 DIAGNOSIS — B070 Plantar wart: Secondary | ICD-10-CM | POA: Diagnosis not present

## 2020-12-19 DIAGNOSIS — R197 Diarrhea, unspecified: Secondary | ICD-10-CM | POA: Insufficient documentation

## 2020-12-19 DIAGNOSIS — R1310 Dysphagia, unspecified: Secondary | ICD-10-CM | POA: Insufficient documentation

## 2020-12-19 DIAGNOSIS — Z800 Family history of malignant neoplasm of digestive organs: Secondary | ICD-10-CM | POA: Insufficient documentation

## 2020-12-19 DIAGNOSIS — R1013 Epigastric pain: Secondary | ICD-10-CM | POA: Insufficient documentation

## 2020-12-19 DIAGNOSIS — R634 Abnormal weight loss: Secondary | ICD-10-CM | POA: Insufficient documentation

## 2020-12-19 DIAGNOSIS — Q828 Other specified congenital malformations of skin: Secondary | ICD-10-CM

## 2020-12-19 DIAGNOSIS — G629 Polyneuropathy, unspecified: Secondary | ICD-10-CM

## 2020-12-19 NOTE — Progress Notes (Signed)
Subjective: Amanda [name] is a 55 y.o. female patient who returns to office for follow up to hard skin and for foot pain. Reports that callus/wart areas are doing better. States that her right great toe is hurting today. Patient denies any other pedal complaints at this time.  Patient Active Problem List   Diagnosis Date Noted  . Abnormal weight loss 12/19/2020  . Change in bowel habit 12/19/2020  . Colon cancer screening 12/19/2020  . Diarrhea 12/19/2020  . Dysphagia 12/19/2020  . Epigastric pain 12/19/2020  . Family history of malignant neoplasm of gastrointestinal tract 12/19/2020  . Non-ST elevation (NSTEMI) myocardial infarction ([place])   . Contusion of chest 08/26/2018  . Cardiac arrest ([place]) 08/26/2018  . HLD (hyperlipidemia) 08/26/2018  . Elevated LFTs 08/26/2018  . History of migraine   . Degenerative joint disease 07/26/2017  . Morbid obesity ([place]) 12/24/2016  . Hyperlipidemia 12/23/2016  . Painful orthopaedic hardware ([place]) 03/24/2016  . Corns and callosities 03/24/2016  . PUD (peptic ulcer disease) 01/02/2016  . Onychomycosis 01/02/2016  . Seasonal allergies 11/19/2015  . GERD (gastroesophageal reflux disease) 08/06/2015  . Asthma 08/06/2015  . Migraine 08/06/2015  . Bipolar disorder ([place]) 07/25/2015  . Neuropathy 07/08/2015  . Major depressive disorder, recurrent, severe without psychotic features ([place])   . Alcohol use disorder, severe, dependence ([place]) 11/16/2014  . Cocaine use disorder, severe, dependence ([place]) 11/16/2014  . Acute calculous cholecystitis s/p lap chole 02/09/2014 02/09/2014  . HTN (hypertension) 07/25/2013  . Carpal tunnel syndrome 11/18/2012  . Lumbar radiculopathy 11/18/2012  . Anxiety state, unspecified 11/18/2012    Current Outpatient Medications on File Prior to Visit  Medication Sig Dispense Refill  . albuterol (VENTOLIN HFA) 108 (90 Base) MCG/ACT inhaler INHALE 2 PUFFS INTO THE LUNGS EVERY 6 HOURS AS NEEDED FOR WHEEZING OR SHORTNESS  OF BREATH. 18 g 3  . ALPRAZolam (XANAX) 0.25 MG tablet alprazolam 0.25 mg tablet  TAKE 1 2 TO 1 (ONE HALF TO ONE) TABLET BY MOUTH TWICE DAILY    . ALPRAZolam (XANAX) 0.5 MG tablet Take 0.5 mg by mouth daily as needed.    Marland Kitchen amLODipine (NORVASC) 5 MG tablet Take 1 tablet (5 mg total) by mouth daily. 30 tablet 6  . atorvastatin (LIPITOR) 10 MG tablet Take 10 mg by mouth at bedtime.    Marland Kitchen atorvastatin (LIPITOR) 40 MG tablet Take 1 tablet (40 mg total) by mouth daily. (Patient taking differently: Take 40 mg by mouth daily. ) 30 tablet 6  . baclofen (LIORESAL) 10 MG tablet Take 10 mg by mouth 3 (three) times daily as needed.    Marland Kitchen buPROPion (WELLBUTRIN SR) 100 MG 12 hr tablet Take 100 mg by mouth every morning.    Marland Kitchen buPROPion (WELLBUTRIN SR) 150 MG 12 hr tablet Take 150 mg by mouth 2 (two) times daily.    Marland Kitchen buPROPion (WELLBUTRIN XL) 300 MG 24 hr tablet Take 300 mg by mouth every morning.    . cetirizine (ZYRTEC) 10 MG tablet Take 1 tablet (10 mg total) by mouth daily. 30 tablet 5  . Cholecalciferol 125 MCG (5000 UT) TABS Take 1 tablet by mouth 2 times a day    . citalopram (CELEXA) 10 MG tablet Take 10 mg by mouth daily.    . citalopram (CELEXA) 20 MG tablet Take 20 mg by mouth daily.    . cloNIDine (CATAPRES) 0.1 MG tablet Take 1 tablet (0.1 mg total) by mouth at bedtime as needed. For hot flashes 30 tablet 3  .  colestipol (COLESTID) 1 g tablet Take 1 tablet (1 g total) by mouth every morning. 30 tablet 4  . cyclobenzaprine (FLEXERIL) 10 MG tablet Take 1 tablet (10 mg total) by mouth 2 (two) times daily. prn 60 tablet 6  . diclofenac Sodium (VOLTAREN) 1 % GEL SMARTSIG:4 Gram(s) Topical Every 6 Hours PRN    . divalproex (DEPAKOTE) 500 MG DR tablet Take 500 mg by mouth 3 (three) times daily.    Marland Kitchen doxycycline (VIBRA-TABS) 100 MG tablet doxycycline hyclate 100 mg tablet  TAKE 1 TABLET BY MOUTH TWICE DAILY FOR 10 DAYS    . DULoxetine (CYMBALTA) 20 MG capsule Take 1 capsule (20 mg total) by mouth daily. 30  capsule 3  . esomeprazole (NEXIUM) 40 MG capsule Take 1 capsule (40 mg total) by mouth daily. 30 capsule 6  . fluticasone (FLONASE) 50 MCG/ACT nasal spray PLACE 2 SPRAYS INTO BOTH NOSTRILS DAILY. 16 g 1  . gabapentin (NEURONTIN) 100 MG capsule     . gabapentin (NEURONTIN) 800 MG tablet gabapentin 800 mg tablet    . haloperidol (HALDOL) 1 MG tablet Take by mouth.    . haloperidol (HALDOL) 10 MG tablet SMARTSIG:1 Tablet(s) By Mouth Morning-Night    . haloperidol (HALDOL) 2 MG tablet Take 2 mg by mouth 2 (two) times daily.    . haloperidol (HALDOL) 5 MG tablet Take 1 tablet by mouth daily.  0  . hydrochlorothiazide (HYDRODIURIL) 25 MG tablet Take 1 tablet (25 mg total) by mouth daily. 30 tablet 6  . HYDROcodone-acetaminophen (NORCO/VICODIN) 5-325 MG tablet Take 1 tablet by mouth 4 (four) times daily as needed.    . hydrOXYzine (ATARAX/VISTARIL) 25 MG tablet Take 25 mg by mouth 4 (four) times daily as needed.    . hydrOXYzine (ATARAX/VISTARIL) 50 MG tablet hydroxyzine HCl 50 mg tablet  TAKE 1 TABLET BY MOUTH 4 TIMES DAILY    . ibuprofen (ADVIL) 800 MG tablet Take 800 mg by mouth 4 (four) times daily as needed.    Marland Kitchen ipratropium (ATROVENT) 0.03 % nasal spray Place 2 sprays into both nostrils 2 (two) times daily.    . isosorbide mononitrate (IMDUR) 30 MG 24 hr tablet Take 1 tablet (30 mg total) by mouth daily. 90 tablet 3  . levofloxacin (LEVAQUIN) 750 MG tablet levofloxacin 750 mg tablet    . lidocaine (LIDODERM) 5 % Place 1 patch onto the skin daily. Remove & Discard patch within 12 hours or as directed by MD 30 patch 3  . meloxicam (MOBIC) 15 MG tablet Take 1 tablet (15 mg total) by mouth daily. 30 tablet 6  . meloxicam (MOBIC) 7.5 MG tablet Take 7.5 mg by mouth daily as needed.    . metoprolol tartrate (LOPRESSOR) 100 MG tablet Take 1 tablet (100 mg total) by mouth 2 (two) times daily. 60 tablet 6  . metoprolol tartrate (LOPRESSOR) 50 MG tablet Take 50 mg by mouth 2 (two) times daily.    Marland Kitchen NARCAN  4 MG/0.1ML LIQD nasal spray kit     . nitroGLYCERIN (NITROSTAT) 0.4 MG SL tablet     . NUCYNTA 50 MG tablet Take 50 mg by mouth 3 (three) times daily as needed.    Marland Kitchen olopatadine (PATANOL) 0.1 % ophthalmic solution PLACE 1 DROP INTO BOTH EYES 2 TIMES DAILY. 5 mL 2  . oxyCODONE-acetaminophen (PERCOCET) 10-325 MG tablet oxycodone-acetaminophen 10 mg-325 mg tablet  TAKE 1 TABLET BY MOUTH THREE TIMES DAILY AS NEEDED    . pantoprazole (PROTONIX) 40 MG tablet Take  40 mg by mouth every morning.    . pregabalin (LYRICA) 75 MG capsule Take 1 capsule (75 mg total) by mouth 2 (two) times daily. 180 capsule 1  . QUEtiapine (SEROQUEL) 25 MG tablet Take 25 mg by mouth at bedtime.    . SUMAtriptan (IMITREX) 50 MG tablet TAKE 1 TABLET BY MOUTH AT THE ONSET OF A MIGRAINE. MAY REPEAT IN 2 HOURS IF HEADACHE PERSISTS OR RECURS. MAXIMUM DAILY DOSE 200 MG 9 tablet 2  . SYMBICORT 80-4.5 MCG/ACT inhaler SMARTSIG:2 Puff(s) By Mouth Twice Daily    . topiramate (TOPAMAX) 100 MG tablet TAKE 2 TABLETS (200 MG TOTAL) BY MOUTH 2 (TWO) TIMES DAILY. 120 tablet 6  . topiramate (TOPAMAX) 25 MG tablet     . traMADol (ULTRAM-ER) 200 MG 24 hr tablet Take 200 mg by mouth daily.    . traMADol (ULTRAM-ER) 300 MG 24 hr tablet Take 300 mg by mouth daily.    . traZODone (DESYREL) 300 MG tablet Take 1 tablet (300 mg total) by mouth at bedtime. 30 tablet 0  . urea (GORDONS UREA) 40 % ointment Apply topically as needed. To nails and dry skin once daily 30 g 0  . venlafaxine XR (EFFEXOR-XR) 37.5 MG 24 hr capsule Take 37.5 mg by mouth daily.    . Vitamin D, Ergocalciferol, (DRISDOL) 1.25 MG (50000 UNIT) CAPS capsule TAKE 1 CAPSULE (50,000 UNITS TOTAL) BY MOUTH ONCE A WEEK. 2 capsule 1   Current Facility-Administered Medications on File Prior to Visit  Medication Dose Route Frequency Provider Last Rate Last Admin  . lidocaine (PF) (XYLOCAINE) 1 % injection 0.3 mL  0.3 mL Other Once Magnus Sinning, MD        Allergies  Allergen Reactions   . Penicillins Anaphylaxis    Has patient had a PCN reaction causing immediate rash, facial/tongue/throat swelling, SOB or lightheadedness with hypotension: Yes Has patient had a PCN reaction causing severe rash involving mucus membranes or skin necrosis: Yes Has patient had a PCN reaction that required hospitalization Yes Has patient had a PCN reaction occurring within the last 10 years: No-more than 10 years ago If all of the above answers are "NO", then may proceed with Cephalosporin use.   Marland Kitchen Penicillins Anaphylaxis    DID THE REACTION INVOLVE: Swelling of the face/tongue/throat, SOB, or low BP? Yes Sudden or severe rash/hives, skin peeling, or the inside of the mouth or nose? Yes Did it require medical treatment? No When did it last happen? Within the past 10 years If all above answers are "NO", may proceed with cephalosporin use.    Diona Fanti [Aspirin] Rash  . Aspirin Rash    Objective:  General: Alert and oriented x3 in no acute distress  Dermatology: Keratotic lesion present plantar foot right greater than left at the cuboid on the right and submet 5 on the left with skin lines transversing the lesion, pain is present with direct pressure to the lesion with a central nucleated core noted, that appears to be improving, no webspace macerations, no ecchymosis bilateral, all nails x 8 are mildly short and thickened, there is minimal nails noted to bilateral hallux from previous history of nail removal.  Vascular: Dorsalis Pedis and Posterior Tibial pedal pulses 1/4, Capillary Fill Time 5 seconds, decreased pedal hair growth bilateral, no edema bilateral lower extremities, Temperature gradient within normal limits.  Neurology: Johney Maine sensation intact via light touch bilateral.  Subjective burning bilateral with history of neuropathy previous nerve studies + today worse at right  great toe.  Musculoskeletal: Mild tenderness with palpation at the keratotic lesion site on Right>Left, Muscular  strength 5/5 in all groups without pain or limitation on range of motion.  Pes planus foot deformity noted bilateral.  Assessment and Plan: Problem List Items Addressed This Visit      Nervous and Auditory   Neuropathy    Other Visit Diagnoses    Plantar wart    -  Primary   Porokeratosis          -Complete examination performed -Re-Discussed treatment options -Parred keratoic lesion using a chisel x2 blade; treated the area with Salinocaine to area -Advised patient to find a PCP to start her back on the nerve meds for her neuropathy pains -Dispensed toe cap to use at right great toe as directed  -Patient to return to office 4-6  weeks for follow-up on keratotic lesions or sooner if condition worsens.  Landis Martins, DPM

## 2021-01-16 ENCOUNTER — Encounter: Payer: Self-pay | Admitting: Sports Medicine

## 2021-01-16 ENCOUNTER — Ambulatory Visit (INDEPENDENT_AMBULATORY_CARE_PROVIDER_SITE_OTHER): Payer: Medicare Other | Admitting: Sports Medicine

## 2021-01-16 ENCOUNTER — Other Ambulatory Visit: Payer: Self-pay

## 2021-01-16 DIAGNOSIS — Q828 Other specified congenital malformations of skin: Secondary | ICD-10-CM

## 2021-01-16 DIAGNOSIS — M79671 Pain in right foot: Secondary | ICD-10-CM

## 2021-01-16 DIAGNOSIS — M79672 Pain in left foot: Secondary | ICD-10-CM

## 2021-01-16 DIAGNOSIS — G629 Polyneuropathy, unspecified: Secondary | ICD-10-CM

## 2021-01-16 DIAGNOSIS — L603 Nail dystrophy: Secondary | ICD-10-CM

## 2021-01-16 NOTE — Progress Notes (Signed)
Subjective: Amanda [name] is a 56 y.o. female patient who returns to office for follow up to hard skin and for foot pain. Reports that callus/wart areas are doing better. States that she wants her nails trimmed today. Patient denies any other pedal complaints at this time.  Patient Active Problem List   Diagnosis Date Noted  . Abnormal weight loss 12/19/2020  . Change in bowel habit 12/19/2020  . Colon cancer screening 12/19/2020  . Diarrhea 12/19/2020  . Dysphagia 12/19/2020  . Epigastric pain 12/19/2020  . Family history of malignant neoplasm of gastrointestinal tract 12/19/2020  . Non-ST elevation (NSTEMI) myocardial infarction ([place])   . Contusion of chest 08/26/2018  . Cardiac arrest ([place]) 08/26/2018  . HLD (hyperlipidemia) 08/26/2018  . Elevated LFTs 08/26/2018  . History of migraine   . Degenerative joint disease 07/26/2017  . Morbid obesity ([place]) 12/24/2016  . Hyperlipidemia 12/23/2016  . Painful orthopaedic hardware ([place]) 03/24/2016  . Corns and callosities 03/24/2016  . PUD (peptic ulcer disease) 01/02/2016  . Onychomycosis 01/02/2016  . Seasonal allergies 11/19/2015  . GERD (gastroesophageal reflux disease) 08/06/2015  . Asthma 08/06/2015  . Migraine 08/06/2015  . Bipolar disorder ([place]) 07/25/2015  . Neuropathy 07/08/2015  . Major depressive disorder, recurrent, severe without psychotic features ([place])   . Alcohol use disorder, severe, dependence ([place]) 11/16/2014  . Cocaine use disorder, severe, dependence ([place]) 11/16/2014  . Acute calculous cholecystitis s/p lap chole 02/09/2014 02/09/2014  . HTN (hypertension) 07/25/2013  . Carpal tunnel syndrome 11/18/2012  . Lumbar radiculopathy 11/18/2012  . Anxiety state, unspecified 11/18/2012    Current Outpatient Medications on File Prior to Visit  Medication Sig Dispense Refill  . albuterol (VENTOLIN HFA) 108 (90 Base) MCG/ACT inhaler INHALE 2 PUFFS INTO THE LUNGS EVERY 6 HOURS AS NEEDED FOR WHEEZING OR SHORTNESS OF  BREATH. 18 g 3  . ALPRAZolam (XANAX) 0.25 MG tablet alprazolam 0.25 mg tablet  TAKE 1 2 TO 1 (ONE HALF TO ONE) TABLET BY MOUTH TWICE DAILY    . ALPRAZolam (XANAX) 0.5 MG tablet Take 0.5 mg by mouth daily as needed.    Marland Kitchen amLODipine (NORVASC) 5 MG tablet Take 1 tablet (5 mg total) by mouth daily. 30 tablet 6  . atorvastatin (LIPITOR) 10 MG tablet Take 10 mg by mouth at bedtime.    Marland Kitchen atorvastatin (LIPITOR) 40 MG tablet Take 1 tablet (40 mg total) by mouth daily. (Patient taking differently: Take 40 mg by mouth daily.) 30 tablet 6  . baclofen (LIORESAL) 10 MG tablet Take 10 mg by mouth 3 (three) times daily as needed.    Marland Kitchen buPROPion (WELLBUTRIN SR) 100 MG 12 hr tablet Take 100 mg by mouth every morning.    Marland Kitchen buPROPion (WELLBUTRIN SR) 150 MG 12 hr tablet Take 150 mg by mouth 2 (two) times daily.    Marland Kitchen buPROPion (WELLBUTRIN XL) 300 MG 24 hr tablet Take 300 mg by mouth every morning.    . cetirizine (ZYRTEC) 10 MG tablet Take 1 tablet (10 mg total) by mouth daily. 30 tablet 5  . Cholecalciferol 125 MCG (5000 UT) TABS Take 1 tablet by mouth 2 times a day    . citalopram (CELEXA) 10 MG tablet Take 10 mg by mouth daily.    . citalopram (CELEXA) 20 MG tablet Take 20 mg by mouth daily.    . cloNIDine (CATAPRES) 0.1 MG tablet Take 1 tablet (0.1 mg total) by mouth at bedtime as needed. For hot flashes 30 tablet 3  . colestipol (  COLESTID) 1 g tablet Take 1 tablet (1 g total) by mouth every morning. 30 tablet 4  . cyclobenzaprine (FLEXERIL) 10 MG tablet Take 1 tablet (10 mg total) by mouth 2 (two) times daily. prn 60 tablet 6  . diclofenac Sodium (VOLTAREN) 1 % GEL SMARTSIG:4 Gram(s) Topical Every 6 Hours PRN    . divalproex (DEPAKOTE) 500 MG DR tablet Take 500 mg by mouth 3 (three) times daily.    Marland Kitchen doxycycline (VIBRA-TABS) 100 MG tablet doxycycline hyclate 100 mg tablet  TAKE 1 TABLET BY MOUTH TWICE DAILY FOR 10 DAYS    . DULoxetine (CYMBALTA) 20 MG capsule Take 1 capsule (20 mg total) by mouth daily. 30  capsule 3  . esomeprazole (NEXIUM) 40 MG capsule Take 1 capsule (40 mg total) by mouth daily. 30 capsule 6  . fluticasone (FLONASE) 50 MCG/ACT nasal spray PLACE 2 SPRAYS INTO BOTH NOSTRILS DAILY. 16 g 1  . gabapentin (NEURONTIN) 100 MG capsule     . gabapentin (NEURONTIN) 800 MG tablet gabapentin 800 mg tablet    . haloperidol (HALDOL) 1 MG tablet Take by mouth.    . haloperidol (HALDOL) 10 MG tablet SMARTSIG:1 Tablet(s) By Mouth Morning-Night    . haloperidol (HALDOL) 2 MG tablet Take 2 mg by mouth 2 (two) times daily.    . haloperidol (HALDOL) 5 MG tablet Take 1 tablet by mouth daily.  0  . hydrochlorothiazide (HYDRODIURIL) 25 MG tablet Take 1 tablet (25 mg total) by mouth daily. 30 tablet 6  . HYDROcodone-acetaminophen (NORCO/VICODIN) 5-325 MG tablet Take 1 tablet by mouth 4 (four) times daily as needed.    . hydrOXYzine (ATARAX/VISTARIL) 25 MG tablet Take 25 mg by mouth 4 (four) times daily as needed.    . hydrOXYzine (ATARAX/VISTARIL) 50 MG tablet hydroxyzine HCl 50 mg tablet  TAKE 1 TABLET BY MOUTH 4 TIMES DAILY    . ibuprofen (ADVIL) 800 MG tablet Take 800 mg by mouth 4 (four) times daily as needed.    Marland Kitchen ipratropium (ATROVENT) 0.03 % nasal spray Place 2 sprays into both nostrils 2 (two) times daily.    . isosorbide mononitrate (IMDUR) 30 MG 24 hr tablet Take 1 tablet (30 mg total) by mouth daily. 90 tablet 3  . levofloxacin (LEVAQUIN) 750 MG tablet levofloxacin 750 mg tablet    . lidocaine (LIDODERM) 5 % Place 1 patch onto the skin daily. Remove & Discard patch within 12 hours or as directed by MD 30 patch 3  . meloxicam (MOBIC) 15 MG tablet Take 1 tablet (15 mg total) by mouth daily. 30 tablet 6  . meloxicam (MOBIC) 7.5 MG tablet Take 7.5 mg by mouth daily as needed.    . metoprolol tartrate (LOPRESSOR) 100 MG tablet Take 1 tablet (100 mg total) by mouth 2 (two) times daily. 60 tablet 6  . metoprolol tartrate (LOPRESSOR) 50 MG tablet Take 50 mg by mouth 2 (two) times daily.    Marland Kitchen NARCAN  4 MG/0.1ML LIQD nasal spray kit     . nitroGLYCERIN (NITROSTAT) 0.4 MG SL tablet     . NUCYNTA 50 MG tablet Take 50 mg by mouth 3 (three) times daily as needed.    Marland Kitchen olopatadine (PATANOL) 0.1 % ophthalmic solution PLACE 1 DROP INTO BOTH EYES 2 TIMES DAILY. 5 mL 2  . oxyCODONE-acetaminophen (PERCOCET) 10-325 MG tablet oxycodone-acetaminophen 10 mg-325 mg tablet  TAKE 1 TABLET BY MOUTH THREE TIMES DAILY AS NEEDED    . pantoprazole (PROTONIX) 40 MG tablet Take 40  mg by mouth every morning.    . pregabalin (LYRICA) 75 MG capsule Take 1 capsule (75 mg total) by mouth 2 (two) times daily. 180 capsule 1  . QUEtiapine (SEROQUEL) 25 MG tablet Take 25 mg by mouth at bedtime.    . SUMAtriptan (IMITREX) 50 MG tablet TAKE 1 TABLET BY MOUTH AT THE ONSET OF A MIGRAINE. MAY REPEAT IN 2 HOURS IF HEADACHE PERSISTS OR RECURS. MAXIMUM DAILY DOSE 200 MG 9 tablet 2  . SYMBICORT 80-4.5 MCG/ACT inhaler SMARTSIG:2 Puff(s) By Mouth Twice Daily    . topiramate (TOPAMAX) 100 MG tablet TAKE 2 TABLETS (200 MG TOTAL) BY MOUTH 2 (TWO) TIMES DAILY. 120 tablet 6  . topiramate (TOPAMAX) 25 MG tablet     . traMADol (ULTRAM-ER) 200 MG 24 hr tablet Take 200 mg by mouth daily.    . traMADol (ULTRAM-ER) 300 MG 24 hr tablet Take 300 mg by mouth daily.    . traZODone (DESYREL) 300 MG tablet Take 1 tablet (300 mg total) by mouth at bedtime. 30 tablet 0  . urea (GORDONS UREA) 40 % ointment Apply topically as needed. To nails and dry skin once daily 30 g 0  . venlafaxine XR (EFFEXOR-XR) 37.5 MG 24 hr capsule Take 37.5 mg by mouth daily.    . Vitamin D, Ergocalciferol, (DRISDOL) 1.25 MG (50000 UNIT) CAPS capsule TAKE 1 CAPSULE (50,000 UNITS TOTAL) BY MOUTH ONCE A WEEK. 2 capsule 1   Current Facility-Administered Medications on File Prior to Visit  Medication Dose Route Frequency Provider Last Rate Last Admin  . lidocaine (PF) (XYLOCAINE) 1 % injection 0.3 mL  0.3 mL Other Once Magnus Sinning, MD        Allergies  Allergen Reactions   . Penicillins Anaphylaxis    Has patient had a PCN reaction causing immediate rash, facial/tongue/throat swelling, SOB or lightheadedness with hypotension: Yes Has patient had a PCN reaction causing severe rash involving mucus membranes or skin necrosis: Yes Has patient had a PCN reaction that required hospitalization Yes Has patient had a PCN reaction occurring within the last 10 years: No-more than 10 years ago If all of the above answers are "NO", then may proceed with Cephalosporin use.   Marland Kitchen Penicillins Anaphylaxis    DID THE REACTION INVOLVE: Swelling of the face/tongue/throat, SOB, or low BP? Yes Sudden or severe rash/hives, skin peeling, or the inside of the mouth or nose? Yes Did it require medical treatment? No When did it last happen? Within the past 10 years If all above answers are "NO", may proceed with cephalosporin use.    Diona Fanti [Aspirin] Rash  . Aspirin Rash    Objective:  General: Alert and oriented x3 in no acute distress  Dermatology: Keratotic lesion present plantar foot right greater than left at the cuboid on the right and submet 5 on the left with skin lines transversing the lesion, pain is present with direct pressure to the lesion with a central nucleated core noted, that appears to be improving, no webspace macerations, no ecchymosis bilateral, all nails x 8 are elongated and thickened, there is minimal nails noted to bilateral hallux from previous history of nail removal.  Vascular: Dorsalis Pedis and Posterior Tibial pedal pulses 1/4, Capillary Fill Time 5 seconds, decreased pedal hair growth bilateral, no edema bilateral lower extremities, Temperature gradient within normal limits.  Neurology: Johney Maine sensation intact via light touch bilateral.  Subjective burning bilateral with history of neuropathy previous nerve studies + today worse at right great toe.  Musculoskeletal: Mild tenderness with palpation at the keratotic lesion site on Right>Left, Muscular  strength 5/5 in all groups without pain or limitation on range of motion.  Pes planus foot deformity noted bilateral.  Assessment and Plan: Problem List Items Addressed This Visit      Nervous and Auditory   Neuropathy    Other Visit Diagnoses    Porokeratosis    -  Primary   Onychodystrophy       Pain in right foot       Left foot pain          -Complete examination performed -Re-Discussed treatment options -Mechically debrided nails x 8 using sterile nail nipper without incident  -Parred keratoic lesion using a chisel x2 blade; treated the area with Salinocaine to area -Continue with good supportive shoes daily for foot type -Patient to return to office 4-6  weeks for follow-up on keratotic lesions or sooner if condition worsens.  Landis Martins, DPM

## 2021-02-05 ENCOUNTER — Other Ambulatory Visit: Payer: Self-pay | Admitting: Family Medicine

## 2021-02-05 DIAGNOSIS — Z780 Asymptomatic menopausal state: Secondary | ICD-10-CM

## 2021-02-05 DIAGNOSIS — Z1231 Encounter for screening mammogram for malignant neoplasm of breast: Secondary | ICD-10-CM

## 2021-02-05 DIAGNOSIS — R1031 Right lower quadrant pain: Secondary | ICD-10-CM

## 2021-02-13 ENCOUNTER — Ambulatory Visit: Payer: Medicare Other | Admitting: Sports Medicine

## 2021-02-21 ENCOUNTER — Ambulatory Visit (HOSPITAL_COMMUNITY)
Admission: EM | Admit: 2021-02-21 | Discharge: 2021-02-21 | Disposition: A | Discharge status: Other Acute Inpatient Hospital | Payer: Medicare Other | Attending: Psychiatry | Admitting: Psychiatry

## 2021-02-21 ENCOUNTER — Other Ambulatory Visit: Payer: Self-pay

## 2021-02-21 ENCOUNTER — Encounter (HOSPITAL_COMMUNITY): Payer: Self-pay | Admitting: Psychiatry

## 2021-02-21 ENCOUNTER — Inpatient Hospital Stay (HOSPITAL_COMMUNITY)
Admission: AD | Admit: 2021-02-21 | Discharge: 2021-03-04 | DRG: 885 | Disposition: A | Discharge status: Home / Self Care | Payer: Medicare Other | Source: Intra-hospital | Attending: Psychiatry | Admitting: Psychiatry

## 2021-02-21 DIAGNOSIS — Z9151 Personal history of suicidal behavior: Secondary | ICD-10-CM

## 2021-02-21 DIAGNOSIS — F1010 Alcohol abuse, uncomplicated: Secondary | ICD-10-CM | POA: Diagnosis present

## 2021-02-21 DIAGNOSIS — Y905 Blood alcohol level of 100-119 mg/100 ml: Secondary | ICD-10-CM | POA: Diagnosis present

## 2021-02-21 DIAGNOSIS — F1994 Other psychoactive substance use, unspecified with psychoactive substance-induced mood disorder: Secondary | ICD-10-CM

## 2021-02-21 DIAGNOSIS — F1290 Cannabis use, unspecified, uncomplicated: Secondary | ICD-10-CM | POA: Diagnosis not present

## 2021-02-21 DIAGNOSIS — K5900 Constipation, unspecified: Secondary | ICD-10-CM | POA: Diagnosis present

## 2021-02-21 DIAGNOSIS — F10239 Alcohol dependence with withdrawal, unspecified: Secondary | ICD-10-CM | POA: Diagnosis present

## 2021-02-21 DIAGNOSIS — Z79899 Other long term (current) drug therapy: Secondary | ICD-10-CM

## 2021-02-21 DIAGNOSIS — Z20822 Contact with and (suspected) exposure to covid-19: Secondary | ICD-10-CM | POA: Diagnosis present

## 2021-02-21 DIAGNOSIS — J45909 Unspecified asthma, uncomplicated: Secondary | ICD-10-CM | POA: Diagnosis present

## 2021-02-21 DIAGNOSIS — M1990 Unspecified osteoarthritis, unspecified site: Secondary | ICD-10-CM | POA: Diagnosis present

## 2021-02-21 DIAGNOSIS — F4310 Post-traumatic stress disorder, unspecified: Secondary | ICD-10-CM | POA: Diagnosis present

## 2021-02-21 DIAGNOSIS — F315 Bipolar disorder, current episode depressed, severe, with psychotic features: Secondary | ICD-10-CM

## 2021-02-21 DIAGNOSIS — G4700 Insomnia, unspecified: Secondary | ICD-10-CM | POA: Diagnosis present

## 2021-02-21 DIAGNOSIS — I25118 Atherosclerotic heart disease of native coronary artery with other forms of angina pectoris: Secondary | ICD-10-CM | POA: Diagnosis present

## 2021-02-21 DIAGNOSIS — J449 Chronic obstructive pulmonary disease, unspecified: Secondary | ICD-10-CM | POA: Diagnosis present

## 2021-02-21 DIAGNOSIS — F411 Generalized anxiety disorder: Secondary | ICD-10-CM | POA: Diagnosis present

## 2021-02-21 DIAGNOSIS — R45851 Suicidal ideations: Secondary | ICD-10-CM | POA: Diagnosis present

## 2021-02-21 DIAGNOSIS — R52 Pain, unspecified: Secondary | ICD-10-CM

## 2021-02-21 DIAGNOSIS — F1420 Cocaine dependence, uncomplicated: Secondary | ICD-10-CM | POA: Insufficient documentation

## 2021-02-21 DIAGNOSIS — R001 Bradycardia, unspecified: Secondary | ICD-10-CM | POA: Diagnosis present

## 2021-02-21 DIAGNOSIS — F17210 Nicotine dependence, cigarettes, uncomplicated: Secondary | ICD-10-CM | POA: Diagnosis present

## 2021-02-21 DIAGNOSIS — Z886 Allergy status to analgesic agent status: Secondary | ICD-10-CM

## 2021-02-21 DIAGNOSIS — F1020 Alcohol dependence, uncomplicated: Secondary | ICD-10-CM | POA: Diagnosis not present

## 2021-02-21 DIAGNOSIS — F3130 Bipolar disorder, current episode depressed, mild or moderate severity, unspecified: Secondary | ICD-10-CM | POA: Diagnosis present

## 2021-02-21 DIAGNOSIS — K219 Gastro-esophageal reflux disease without esophagitis: Secondary | ICD-10-CM | POA: Diagnosis present

## 2021-02-21 DIAGNOSIS — F1410 Cocaine abuse, uncomplicated: Secondary | ICD-10-CM

## 2021-02-21 DIAGNOSIS — I10 Essential (primary) hypertension: Secondary | ICD-10-CM | POA: Diagnosis present

## 2021-02-21 LAB — POCT URINE DRUG SCREEN - MANUAL ENTRY (I-SCREEN)
POC Amphetamine UR: NOT DETECTED
POC Buprenorphine (BUP): NOT DETECTED
POC Cocaine UR: POSITIVE — AB
POC Marijuana UR: NOT DETECTED
POC Methadone UR: NOT DETECTED
POC Methamphetamine UR: NOT DETECTED
POC Morphine: NOT DETECTED
POC Oxazepam (BZO): NOT DETECTED
POC Oxycodone UR: NOT DETECTED
POC Secobarbital (BAR): NOT DETECTED

## 2021-02-21 LAB — CBC WITH DIFFERENTIAL/PLATELET
Abs Immature Granulocytes: 0.02 10*3/uL (ref 0.00–0.07)
Basophils Absolute: 0.1 10*3/uL (ref 0.0–0.1)
Basophils Relative: 1 %
Eosinophils Absolute: 0.1 10*3/uL (ref 0.0–0.5)
Eosinophils Relative: 1 %
HCT: 43.9 % (ref 36.0–46.0)
Hemoglobin: 14.3 g/dL (ref 12.0–15.0)
Immature Granulocytes: 0 %
Lymphocytes Relative: 40 %
Lymphs Abs: 2.9 10*3/uL (ref 0.7–4.0)
MCH: 32.9 pg (ref 26.0–34.0)
MCHC: 32.6 g/dL (ref 30.0–36.0)
MCV: 101.2 fL — ABNORMAL HIGH (ref 80.0–100.0)
Monocytes Absolute: 0.7 10*3/uL (ref 0.1–1.0)
Monocytes Relative: 10 %
Neutro Abs: 3.4 10*3/uL (ref 1.7–7.7)
Neutrophils Relative %: 48 %
Platelets: 216 10*3/uL (ref 150–400)
RBC: 4.34 MIL/uL (ref 3.87–5.11)
RDW: 14.6 % (ref 11.5–15.5)
WBC: 7.2 10*3/uL (ref 4.0–10.5)
nRBC: 0 % (ref 0.0–0.2)

## 2021-02-21 LAB — URINALYSIS, ROUTINE W REFLEX MICROSCOPIC
Bilirubin Urine: NEGATIVE
Glucose, UA: NEGATIVE mg/dL
Hgb urine dipstick: NEGATIVE
Ketones, ur: NEGATIVE mg/dL
Leukocytes,Ua: NEGATIVE
Nitrite: NEGATIVE
Protein, ur: NEGATIVE mg/dL
Specific Gravity, Urine: 1.002 — ABNORMAL LOW (ref 1.005–1.030)
pH: 6 (ref 5.0–8.0)

## 2021-02-21 LAB — COMPREHENSIVE METABOLIC PANEL
ALT: 34 U/L (ref 0–44)
AST: 34 U/L (ref 15–41)
Albumin: 4.4 g/dL (ref 3.5–5.0)
Alkaline Phosphatase: 100 U/L (ref 38–126)
Anion gap: 14 (ref 5–15)
BUN: 6 mg/dL (ref 6–20)
CO2: 23 mmol/L (ref 22–32)
Calcium: 9.5 mg/dL (ref 8.9–10.3)
Chloride: 104 mmol/L (ref 98–111)
Creatinine, Ser: 0.97 mg/dL (ref 0.44–1.00)
GFR, Estimated: >60 mL/min (ref >60)
Glucose, Bld: 81 mg/dL (ref 70–99)
Potassium: 3.7 mmol/L (ref 3.5–5.1)
Sodium: 141 mmol/L (ref 135–145)
Total Bilirubin: 0.9 mg/dL (ref 0.3–1.2)
Total Protein: 7.5 g/dL (ref 6.5–8.1)

## 2021-02-21 LAB — POCT PREGNANCY, URINE: Preg Test, Ur: NEGATIVE

## 2021-02-21 LAB — RESP PANEL BY RT-PCR (FLU A&B, COVID) ARPGX2
Influenza A by PCR: NEGATIVE
Influenza B by PCR: NEGATIVE
SARS Coronavirus 2 by RT PCR: NEGATIVE

## 2021-02-21 LAB — TSH: TSH: 1.868 u[IU]/mL (ref 0.350–4.500)

## 2021-02-21 LAB — LIPID PANEL
Cholesterol: 214 mg/dL — ABNORMAL HIGH (ref 0–200)
HDL: 58 mg/dL (ref >40)
LDL Cholesterol: 129 mg/dL — ABNORMAL HIGH (ref 0–99)
Total CHOL/HDL Ratio: 3.7 RATIO
Triglycerides: 135 mg/dL (ref <150)
VLDL: 27 mg/dL (ref 0–40)

## 2021-02-21 LAB — POC SARS CORONAVIRUS 2 AG: SARSCOV2ONAVIRUS 2 AG: NEGATIVE

## 2021-02-21 LAB — ETHANOL: Alcohol, Ethyl (B): 119 mg/dL — ABNORMAL HIGH (ref <10)

## 2021-02-21 LAB — PREGNANCY, URINE: Preg Test, Ur: NEGATIVE

## 2021-02-21 MED ORDER — TRAZODONE HCL 150 MG PO TABS: 300.0000 mg | ORAL_TABLET | Freq: Every day | ORAL | Status: DC

## 2021-02-21 MED ORDER — ACETAMINOPHEN 325 MG PO TABS
650.0000 mg | ORAL_TABLET | Freq: Four times a day (QID) | ORAL | Status: DC | PRN
  Administered 2021-02-21: 650 mg via ORAL
  Filled 2021-02-21: qty 2

## 2021-02-21 MED ORDER — TRAZODONE HCL 150 MG PO TABS
300.0000 mg | ORAL_TABLET | Freq: Every day | ORAL | Status: DC
  Administered 2021-02-22: 300 mg via ORAL
  Filled 2021-02-21 (×5): qty 2

## 2021-02-21 MED ORDER — MAGNESIUM HYDROXIDE 400 MG/5ML PO SUSP
30.0000 mL | Freq: Every day | ORAL | Status: DC | PRN
  Administered 2021-02-22 – 2021-02-25 (×2): 30 mL via ORAL
  Filled 2021-02-21 (×2): qty 30

## 2021-02-21 MED ORDER — HYDROXYZINE HCL 25 MG PO TABS
25.0000 mg | ORAL_TABLET | Freq: Four times a day (QID) | ORAL | 0 refills | Status: DC | PRN
  Filled 2021-02-21: qty 30, 8d supply, fill #0

## 2021-02-21 MED ORDER — ALBUTEROL SULFATE HFA 108 (90 BASE) MCG/ACT IN AERS: 2.0000 | INHALATION_SPRAY | RESPIRATORY_TRACT | Status: DC | PRN

## 2021-02-21 MED ORDER — BUPROPION HCL ER (XL) 300 MG PO TB24
300.0000 mg | ORAL_TABLET | Freq: Every morning | ORAL | Status: DC
  Administered 2021-02-21: 300 mg via ORAL
  Filled 2021-02-21: qty 1

## 2021-02-21 MED ORDER — AMLODIPINE BESYLATE 5 MG PO TABS
5.0000 mg | ORAL_TABLET | Freq: Every day | ORAL | Status: DC
  Filled 2021-02-21 (×3): qty 1

## 2021-02-21 MED ORDER — PANTOPRAZOLE SODIUM 40 MG PO TBEC
40.0000 mg | DELAYED_RELEASE_TABLET | Freq: Every morning | ORAL | Status: DC
  Administered 2021-02-22 – 2021-03-04 (×14): 40 mg via ORAL
  Filled 2021-02-21 (×19): qty 1

## 2021-02-21 MED ORDER — ISOSORBIDE MONONITRATE ER 60 MG PO TB24: 60.0000 mg | ORAL_TABLET | Freq: Every day | ORAL | Status: DC

## 2021-02-21 MED ORDER — ATORVASTATIN CALCIUM 40 MG PO TABS
40.0000 mg | ORAL_TABLET | Freq: Every day | ORAL | 6 refills | Status: DC
  Filled 2021-02-21: qty 90, 90d supply, fill #0

## 2021-02-21 MED ORDER — ALUM & MAG HYDROXIDE-SIMETH 200-200-20 MG/5ML PO SUSP
30.0000 mL | ORAL | Status: DC | PRN
  Administered 2021-02-22 – 2021-02-23 (×2): 30 mL via ORAL
  Filled 2021-02-21 (×2): qty 30

## 2021-02-21 MED ORDER — ENSURE ENLIVE PO LIQD
237.0000 mL | Freq: Two times a day (BID) | ORAL | Status: DC
  Administered 2021-02-21 – 2021-03-03 (×11): 237 mL via ORAL
  Filled 2021-02-21 (×26): qty 237

## 2021-02-21 MED ORDER — MAGNESIUM HYDROXIDE 400 MG/5ML PO SUSP: 30.0000 mL | Freq: Every day | ORAL | Status: DC | PRN

## 2021-02-21 MED ORDER — CLONIDINE HCL 0.1 MG PO TABS: 0.1000 mg | ORAL_TABLET | Freq: Three times a day (TID) | ORAL | Status: DC | PRN

## 2021-02-21 MED ORDER — PANTOPRAZOLE SODIUM 40 MG PO TBEC
40.0000 mg | DELAYED_RELEASE_TABLET | Freq: Every morning | ORAL | Status: DC
  Administered 2021-02-21: 40 mg via ORAL
  Filled 2021-02-21: qty 1

## 2021-02-21 MED ORDER — METOPROLOL TARTRATE 50 MG PO TABS
50.0000 mg | ORAL_TABLET | Freq: Two times a day (BID) | ORAL | Status: DC
  Filled 2021-02-21 (×2): qty 1

## 2021-02-21 MED ORDER — HYDROXYZINE HCL 25 MG PO TABS: 25.0000 mg | ORAL_TABLET | Freq: Four times a day (QID) | ORAL | Status: DC | PRN

## 2021-02-21 MED ORDER — AMLODIPINE BESYLATE 10 MG PO TABS
10.0000 mg | ORAL_TABLET | Freq: Every day | ORAL | Status: DC
  Filled 2021-02-21: qty 1

## 2021-02-21 MED ORDER — ISOSORBIDE MONONITRATE ER 30 MG PO TB24
60.0000 mg | ORAL_TABLET | Freq: Every day | ORAL | Status: DC
  Administered 2021-02-21: 60 mg via ORAL
  Filled 2021-02-21: qty 2

## 2021-02-21 MED ORDER — ATORVASTATIN CALCIUM 40 MG PO TABS
40.0000 mg | ORAL_TABLET | Freq: Every day | ORAL | Status: DC
  Administered 2021-02-22 – 2021-03-04 (×11): 40 mg via ORAL
  Filled 2021-02-21 (×14): qty 1

## 2021-02-21 MED ORDER — BUPROPION HCL ER (XL) 300 MG PO TB24
300.0000 mg | ORAL_TABLET | Freq: Every morning | ORAL | Status: DC
Start: 2021-02-22 — End: 2021-03-04

## 2021-02-21 MED ORDER — METOPROLOL TARTRATE 50 MG PO TABS
50.0000 mg | ORAL_TABLET | Freq: Two times a day (BID) | ORAL | Status: DC
  Administered 2021-02-21: 50 mg via ORAL
  Filled 2021-02-21: qty 1

## 2021-02-21 MED ORDER — TRAZODONE HCL 150 MG PO TABS
150.0000 mg | ORAL_TABLET | Freq: Once | ORAL | Status: AC
  Administered 2021-02-22: 150 mg via ORAL
  Filled 2021-02-21: qty 1

## 2021-02-21 MED ORDER — BUPROPION HCL 75 MG PO TABS
75.0000 mg | ORAL_TABLET | Freq: Every day | ORAL | Status: DC
  Administered 2021-02-22 – 2021-02-24 (×3): 75 mg via ORAL
  Filled 2021-02-21 (×7): qty 1

## 2021-02-21 MED ORDER — AMLODIPINE BESYLATE 5 MG PO TABS
5.0000 mg | ORAL_TABLET | Freq: Every day | ORAL | Status: DC
  Administered 2021-02-21: 5 mg via ORAL
  Filled 2021-02-21: qty 1

## 2021-02-21 MED ORDER — ALBUTEROL SULFATE HFA 108 (90 BASE) MCG/ACT IN AERS
2.0000 | INHALATION_SPRAY | RESPIRATORY_TRACT | Status: DC | PRN
  Administered 2021-02-27 – 2021-03-04 (×2): 2 via RESPIRATORY_TRACT
  Filled 2021-02-21: qty 6.7

## 2021-02-21 MED ORDER — ISOSORBIDE MONONITRATE ER 60 MG PO TB24
60.0000 mg | ORAL_TABLET | Freq: Every day | ORAL | Status: DC
  Administered 2021-02-22: 60 mg via ORAL
  Filled 2021-02-21 (×2): qty 1

## 2021-02-21 MED ORDER — ALUM & MAG HYDROXIDE-SIMETH 200-200-20 MG/5ML PO SUSP: 30.0000 mL | ORAL | Status: DC | PRN

## 2021-02-21 MED ORDER — LORAZEPAM 1 MG PO TABS
1.0000 mg | ORAL_TABLET | Freq: Four times a day (QID) | ORAL | Status: DC | PRN
  Administered 2021-02-22 – 2021-02-25 (×2): 1 mg via ORAL
  Filled 2021-02-21 (×2): qty 1

## 2021-02-21 MED ORDER — BUPROPION HCL ER (XL) 300 MG PO TB24
300.0000 mg | ORAL_TABLET | Freq: Every morning | ORAL | Status: DC
  Filled 2021-02-21 (×2): qty 1

## 2021-02-21 MED ORDER — RISPERIDONE 2 MG PO TBDP: 2.0000 mg | ORAL_TABLET | Freq: Three times a day (TID) | ORAL | Status: DC | PRN

## 2021-02-21 MED ORDER — BUPROPION HCL ER (XL) 150 MG PO TB24
150.0000 mg | ORAL_TABLET | Freq: Every morning | ORAL | Status: DC
  Filled 2021-02-21: qty 1

## 2021-02-21 MED ORDER — METOPROLOL TARTRATE 50 MG PO TABS: 50.0000 mg | ORAL_TABLET | Freq: Two times a day (BID) | ORAL | Status: DC

## 2021-02-21 MED ORDER — METOPROLOL TARTRATE 50 MG PO TABS
50.0000 mg | ORAL_TABLET | Freq: Two times a day (BID) | ORAL | Status: DC
  Filled 2021-02-21 (×4): qty 1

## 2021-02-21 MED ORDER — ACETAMINOPHEN 325 MG PO TABS
650.0000 mg | ORAL_TABLET | Freq: Four times a day (QID) | ORAL | Status: DC | PRN
  Administered 2021-02-21 – 2021-02-28 (×10): 650 mg via ORAL
  Filled 2021-02-21 (×10): qty 2

## 2021-02-21 MED ORDER — ATORVASTATIN CALCIUM 40 MG PO TABS
40.0000 mg | ORAL_TABLET | Freq: Every day | ORAL | Status: DC
  Administered 2021-02-21: 40 mg via ORAL
  Filled 2021-02-21: qty 1

## 2021-02-21 MED ORDER — ZIPRASIDONE MESYLATE 20 MG IM SOLR: 20.0000 mg | Freq: Two times a day (BID) | INTRAMUSCULAR | Status: AC | PRN

## 2021-02-21 MED ORDER — MELATONIN 5 MG PO TABS
5.0000 mg | ORAL_TABLET | Freq: Every evening | ORAL | Status: DC | PRN
  Administered 2021-02-22 – 2021-02-23 (×3): 5 mg via ORAL
  Filled 2021-02-21 (×3): qty 1
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
## 2021-02-21 NOTE — Tx Team (Signed)
Initial Treatment Plan 02/21/2021 6:10 PM [name] AEW:[id number]    PATIENT STRESSORS: Substance abuse Other: living situation   PATIENT STRENGTHS: Capable of independent living Manufacturing systems engineer Motivation for treatment/growth Supportive family/friends   PATIENT IDENTIFIED PROBLEMS: Suicidal ideation    "I just don't want to live"     "I don't feel safe at home"             DISCHARGE CRITERIA:  Improved stabilization in mood, thinking, and/or behavior Reduction of life-threatening or endangering symptoms to within safe limits Verbal commitment to aftercare and medication compliance Withdrawal symptoms are absent or subacute and managed without 24-hour nursing intervention  PRELIMINARY DISCHARGE PLAN: Attend 12-step recovery group Outpatient therapy Placement in alternative living arrangements  PATIENT/FAMILY INVOLVEMENT: This treatment plan has been presented to and reviewed with the patient, Amanda [name], .  The patient has been given the opportunity to ask questions and make suggestions.  Shela Nevin, RN 02/21/2021, 6:10 PM
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
## 2021-02-21 NOTE — ED Notes (Signed)
Patient Discharged

## 2021-02-21 NOTE — Progress Notes (Signed)
Pt accepted to [facility]   Patient meets inpatient criteria per Earlene Plater, MD  Dr. Jola Babinski is the attending provider.    Call report to [phone]    Robie Ridge, RN @ [facility] ED notified.     Pt scheduled  to arrive at [facility] TODAY by 1400.    Damita Dunnings, MSW, LCSW-A  12:11 PM 02/21/2021

## 2021-02-21 NOTE — ED Notes (Signed)
Belongings in locker 26 
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
## 2021-02-21 NOTE — ED Notes (Signed)
[facility] arrived to transport patient to [facility].  Patient ambulated independently to sally port without issue.  Personal belongings, including medications, given to [employer] driver.  Patient discharged in stable condition; no acute distress noted.

## 2021-02-21 NOTE — Discharge Instructions (Addendum)
Transfer to [facility], accepted to bed 301-1

## 2021-02-21 NOTE — [facility] Suicide Risk Assessment (Signed)
[facility] Admission Suicide Risk Assessment   Nursing information obtained from:    Demographic factors:    Current Mental Status:    Loss Factors:    Historical Factors:    Risk Reduction Factors:     Total Time spent with patient: 20 minutes Principal Problem: <principal problem not specified> Diagnosis:  Active Problems:   Bipolar disorder with current episode depressed (HCC)  Subjective Data: Patient is seen and examined.  Patient is a 54 year old female with a reported past psychiatric history significant for bipolar disorder, cocaine use disorder versus cocaine dependence, alcohol use disorder versus alcohol dependence who presented to the [facility] on 02/21/2021 with suicidal ideation.  The patient had been originally brought to the behavioral health center by [facility] police.  The patient stated that on the morning of admission she had placed a butcher knife to her chest and attempt to end her life.  But she decided to call the [facility] police because it scared her.  She admitted that she had had previous suicide attempts over the last 6 months.  She stated that she had been feeling more helpless, hopeless, worthless and withdrawn from others most recently.  She stated she is also not been caring for her own hygiene.  She admitted that she had been drinking alcohol excessively, and admitted to blackouts.  She also admitted to marijuana use as well as cocaine.  She has a previous history of bipolar disorder.  She has been followed at [facility], but apparently had not been there since sometime in 2021.  Her list of medications from [facility] included haloperidol, cholecalciferol, gabapentin, hydroxyzine, Depakote delayed release, trazodone and Wellbutrin sustained release.  She stated she had not been at [facility] in quite a while, and has most recently been followed at [facility].  She was last seen at [facility] last month.  She stated that she had not had any  sobriety from substances for at least several years.  She stated she had not been in substance rehabilitation in several years.  She is frightened to return back to the home that she currently lives in because she does not believe that is safe for her.  The decision was made to admit her to the hospital for evaluation and stabilization.  Continued Clinical Symptoms:    The "Alcohol Use Disorders Identification Test", Guidelines for Use in Primary Care, Second Edition.  World Science writer [facility]). Score between 0-7:  no or low risk or alcohol related problems. Score between 8-15:  moderate risk of alcohol related problems. Score between 16-19:  high risk of alcohol related problems. Score 20 or above:  warrants further diagnostic evaluation for alcohol dependence and treatment.   CLINICAL FACTORS:   Bipolar Disorder:   Depressive phase Alcohol/Substance Abuse/Dependencies   Musculoskeletal: Strength & Muscle Tone: within normal limits Gait & Station: normal Patient leans: N/A  Psychiatric Specialty Exam:  Presentation  General Appearance: Disheveled  Eye Contact:Fair  Speech:Normal Rate  Speech Volume:Decreased  Handedness: Right  Mood and Affect  Mood:Anxious; Depressed  Affect:Congruent   Thought Process  Thought Processes:Goal Directed  Descriptions of Associations:Circumstantial  Orientation:Full (Time, Place and Person)  Thought Content:Rumination  History of Schizophrenia/Schizoaffective disorder:No  Duration of Psychotic Symptoms:No data recorded Hallucinations:Hallucinations: Auditory; Visual Description of Auditory Hallucinations: Voices saying "It's ok to kill yourself." Description of Visual Hallucinations: Elephants in the closet  Ideas of Reference:Delusions Suicidal Thoughts:Suicidal Thoughts: Yes, Active SI Active Intent and/or Plan: With Means to [employer]  Homicidal Thoughts:Homicidal Thoughts: No  Sensorium  Memory:Immediate Poor;  Recent Poor; Remote Poor  Judgment:Impaired  Insight:Lacking   Executive Functions  Concentration:Poor  Attention Span:Poor  Recall:Poor  Fund of Knowledge:Poor  Language:Poor   Psychomotor Activity  Psychomotor Activity:Psychomotor Activity: Increased   Assets  Assets:Desire for Improvement; Resilience   Sleep  Sleep:Sleep: Poor Number of Hours of Sleep: -1    Physical Exam: Physical Exam Vitals and nursing note reviewed.  HENT:     Head: Normocephalic and atraumatic.  Pulmonary:     Effort: Pulmonary effort is normal.  Neurological:     General: No focal deficit present.     Mental Status: She is alert and oriented to person, place, and time.   Review of Systems  Psychiatric/Behavioral:  Positive for depression, substance abuse and suicidal ideas. The patient is nervous/anxious and has insomnia.   All other systems reviewed and are negative. Blood pressure 111/66, pulse 66, temperature 98.3 F (36.8 C), temperature source Oral, resp. rate 16, SpO2 100 %. There is no height or weight on file to calculate BMI.   COGNITIVE FEATURES THAT CONTRIBUTE TO RISK:  None    SUICIDE RISK:   Moderate:  Frequent suicidal ideation with limited intensity, and duration, some specificity in terms of plans, no associated intent, good self-control, limited dysphoria/symptomatology, some risk factors present, and identifiable protective factors, including available and accessible social support.  PLAN OF CARE: Patient is seen and examined.  Patient is a 55 year old female with the above-stated past psychiatric history with a reported history of bipolar disorder as well as polysubstance use disorders who was admitted secondary to suicidal ideation and depression.  She will be admitted to the hospital.  She will be integrated in the milieu.  She will be encouraged to attend groups.  Given her reported history of bipolar disorder I am going to change her Wellbutrin to short acting  Wellbutrin 75 mg p.o. daily and titrate that.  She will be monitored to see if there is any exacerbation of a manic state from the initiation of antidepressant medications.  Clearly Wellbutrin would have the least possibility of doing this.  We will also continue her medications with regard to hypertension, hyperlipidemia, GERD.  I would recommend stopping the metoprolol.  Given her her cocaine usage there is a fairly significant risk of sudden cardiac abnormalities with the presence of metoprolol and cocaine.  I will put on placed clonidine 0.1 mg p.o. 3 times daily as needed a systolic blood pressure greater than 160.  Review of her admission laboratories revealed essentially normal electrolytes including creatinine at 0.97 and liver function enzymes.  Her lipid panel showed an elevated cholesterol of 214.  CBC showed an elevated MCV at 101.2.  We will supplement her with folic acid as well as thiamine.  Pregnancy test was negative.  TSH was normal at 1.860.  Respiratory panel was negative for influenza A, B and coronavirus.  Her blood alcohol was 119, and we will also place her on lorazepam 1 mg p.o. every 6 hours as needed a CIWA greater than 10.  Drug screen was positive for cocaine.  Her EKG showed a normal sinus rhythm, but her QTc interval was prolonged at 489.  Currently her vital signs are stable, she is afebrile.  Pulse oximetry on room air was 99 to 100%.  She does want to discuss with social work the possibility of residential substance abuse treatment.  I certify that inpatient services furnished can reasonably be expected to  improve the patient's condition.   Antonieta Pert, MD 02/21/2021, 3:54 PM

## 2021-02-21 NOTE — ED Notes (Signed)
Patient cooperative with admission process.  Ambulated independently to unit.  Given snack and juice.  Patient using phone to call sister.  Endorsed SI without a plan.  Contracted for safety.  Denied HI, current AVH.  Patient stated she does sometimes hear things and sometimes see "animals', but not now.

## 2021-02-21 NOTE — ED Notes (Signed)
Report called to Harrison Mons, RN at [facility].

## 2021-02-21 NOTE — ED Notes (Signed)
[facility] called 

## 2021-02-21 NOTE — H&P (Signed)
Psychiatric Admission Assessment Adult  Patient Identification: Amanda [name] MRN:  [id number] Date of Evaluation:  02/21/2021 Chief Complaint:  Bipolar disorder with current episode depressed (HCC) [F31.30] Principal Diagnosis: <principal problem not specified> Diagnosis:  Active Problems:   Bipolar disorder with current episode depressed (HCC)  History of Present Illness: Patient is seen and examined.  Patient is a 55 year old female with a reported past psychiatric history significant for bipolar disorder, cocaine use disorder versus cocaine dependence, alcohol use disorder versus alcohol dependence who presented to the [facility] on 02/21/2021 with suicidal ideation.  The patient had been originally brought to the behavioral health center by [facility] police.  The patient stated that on the morning of admission she had placed a butcher knife to her chest and attempt to end her life.  But she decided to call the [facility] police because it scared her.  She admitted that she had had previous suicide attempts over the last 6 months.  She stated that she had been feeling more helpless, hopeless, worthless and withdrawn from others most recently.  She stated she is also not been caring for her own hygiene.  She admitted that she had been drinking alcohol excessively, and admitted to blackouts.  She also admitted to marijuana use as well as cocaine.  She has a previous history of bipolar disorder.  She has been followed at [facility], but apparently had not been there since sometime in 2021.  Her list of medications from [facility] included haloperidol, cholecalciferol, gabapentin, hydroxyzine, Depakote delayed release, trazodone and Wellbutrin sustained release.  She stated she had not been at [facility] in quite a while, and has most recently been followed at [facility].  She was last seen at [facility] medical last month.  She stated that she had not had any sobriety from substances  for at least several years.  She stated she had not been in substance rehabilitation in several years.  She is frightened to return back to the home that she currently lives in because she does not believe that is safe for her.  The decision was made to admit her to the hospital for evaluation and stabilization.   Associated Signs/Symptoms: Depression Symptoms:  depressed mood, anhedonia, insomnia, psychomotor agitation, fatigue, feelings of worthlessness/guilt, difficulty concentrating, hopelessness, impaired memory, suicidal thoughts with specific plan, anxiety, loss of energy/fatigue, disturbed sleep, Duration of Depression Symptoms: Greater than two weeks  (Hypo) Manic Symptoms:  Hallucinations, Impulsivity, Irritable Mood, Labiality of Mood, Anxiety Symptoms:  Excessive Worry, Psychotic Symptoms:  Delusions, Hallucinations: Auditory Visual Paranoia, PTSD Symptoms: Had a traumatic exposure:    Sexually traumatized as a child Total Time spent with patient: 30 minutes  Past Psychiatric History: Patient's last psychiatric hospitalization in our facility was in 2016.  Her diagnosis at that time was alcohol use disorder, major depression, cocaine use disorder.  She has been followed as an outpatient at [facility] as well as [facility].  She has been previously treated with Haldol, Wellbutrin, Depakote and other medications.  Is the patient at risk to self? Yes.    Has the patient been a risk to self in the past 6 months? Yes.    Has the patient been a risk to self within the distant past? Yes.    Is the patient a risk to others? No.  Has the patient been a risk to others in the past 6 months? No.  Has the patient been a risk to others within the distant past? No.  Prior Inpatient Therapy:   Prior Outpatient Therapy:    Alcohol Screening:   Substance Abuse History in the last 12 months:  Yes.   Consequences of Substance Abuse: Medical Consequences:    Withdrawal  Symptoms:   Diaphoresis Diarrhea Headaches Nausea Tremors Vomiting Previous Psychotropic Medications: Yes  Psychological Evaluations: Yes  Past Medical History:  Past Medical History:  Diagnosis Date   Anxiety    Arthritis    lower back   Asthma    Bipolar disorder (HCC)    Carpal tunnel syndrome, bilateral    Chronic back pain    Depression    Diverticulosis    Fx. left wrist    GERD (gastroesophageal reflux disease)    Heart murmur    "born with"   Heart murmur    HLD (hyperlipidemia)    Hypertension    IBS (irritable bowel syndrome)    Internal hemorrhoids    Muscle spasms of neck    Neuropathy    PUD (peptic ulcer disease)     Past Surgical History:  Procedure Laterality Date   ARTHRODESIS METATARSALPHALANGEAL JOINT (MTPJ) Right 04/29/2016   Procedure: RIGHT GREAT TOE METATARSOPHALANGEAL JOINT (MTPJ) FUSION, HARDWARE REMOVAL;  Surgeon: Tarry Kos, MD;  Location: [facility];  Service: Orthopedics;  Laterality: Right;  RIGHT GREAT TOE METATARSOPHALANGEAL JOINT (MTPJ) FUSION, HARDWARE REMOVAL   CARPAL TUNNEL RELEASE Right 2003   CHOLECYSTECTOMY N/A 02/09/2014   Procedure: LAPAROSCOPIC CHOLECYSTECTOMY WITH INTRAOPERATIVE CHOLANGIOGRAM;  Surgeon: Valarie Merino, MD;  Location: [facility];  Service: General;  Laterality: N/A;   CHOLECYSTECTOMY     FOOT SURGERY     HAMMER TOE FUSION Bilateral 2008   HARDWARE REMOVAL Right 04/29/2016   Procedure: HARDWARE REMOVAL;  Surgeon: Tarry Kos, MD;  Location: [facility];  Service: Orthopedics;  Laterality: Right;  HARDWARE REMOVAL   LEFT HEART CATH AND CORONARY ANGIOGRAPHY N/A 08/30/2018   Procedure: LEFT HEART CATH AND CORONARY ANGIOGRAPHY;  Surgeon: Marykay Lex, MD;  Location: [facility] INVASIVE CV LAB;  Service: Cardiovascular;  Laterality: N/A;   ROTATOR CUFF REPAIR Right 2011   WRIST FRACTURE SURGERY Left    Family History:  Family History  Problem Relation Age of Onset   Pancreatic cancer Mother     CAD Father    Diabetes Mellitus II Father    Heart failure Father    Diabetes Father    Breast cancer Paternal Grandmother    HIV/AIDS Brother    Other Sister        Brain tumor   Colon cancer Neg Hx    Family Psychiatric  History: Noncontributory Tobacco Screening:   Social History:  Social History   Substance and Sexual Activity  Alcohol Use Yes   Comment: heavily daily     Social History   Substance and Sexual Activity  Drug Use Yes   Types: Cocaine, "Crack" cocaine, Marijuana   Comment: last used two years ago    Additional Social History:                           Allergies:   Allergies  Allergen Reactions   Penicillins Anaphylaxis    Has patient had a PCN reaction causing immediate rash, facial/tongue/throat swelling, SOB or lightheadedness with hypotension: Yes Has patient had a PCN reaction causing severe rash involving mucus membranes or skin necrosis: Yes Has patient had a PCN reaction that required hospitalization Yes Has patient had a  PCN reaction occurring within the last 10 years: No-more than 10 years ago If all of the above answers are "NO", then may proceed with Cephalosporin use.    Penicillins Anaphylaxis    DID THE REACTION INVOLVE: Swelling of the face/tongue/throat, SOB, or low BP? Yes Sudden or severe rash/hives, skin peeling, or the inside of the mouth or nose? Yes Did it require medical treatment? No When did it last happen? Within the past 10 years If all above answers are "NO", may proceed with cephalosporin use.     Asa [Aspirin] Rash   Aspirin Rash   Lab Results:  Results for orders placed or performed during the hospital encounter of 02/21/21 (from the past 48 hour(s))  Resp Panel by RT-PCR (Flu A&B, Covid) Nasopharyngeal Swab     Status: None   Collection Time: 02/21/21  9:29 AM   Specimen: Nasopharyngeal Swab; Nasopharyngeal(NP) swabs in vial transport medium  Result Value Ref Range   SARS Coronavirus 2 by RT PCR  NEGATIVE NEGATIVE    Comment: (NOTE) SARS-CoV-2 target nucleic acids are NOT DETECTED.  The SARS-CoV-2 RNA is generally detectable in upper respiratory specimens during the acute phase of infection. The lowest concentration of SARS-CoV-2 viral copies this assay can detect is 138 copies/mL. A negative result does not preclude SARS-Cov-2 infection and should not be used as the sole basis for treatment or other patient management decisions. A negative result may occur with  improper specimen collection/handling, submission of specimen other than nasopharyngeal swab, presence of viral mutation(s) within the areas targeted by this assay, and inadequate number of viral copies(<138 copies/mL). A negative result must be combined with clinical observations, patient history, and epidemiological information. The expected result is Negative.  Fact Sheet for Patients:  BloggerCourse.com  Fact Sheet for Healthcare Providers:  SeriousBroker.it  This test is no t yet approved or cleared by the [place] FDA and  has been authorized for detection and/or diagnosis of SARS-CoV-2 by FDA under an Emergency Use Authorization (EUA). This EUA will remain  in effect (meaning this test can be used) for the duration of the COVID-19 declaration under Section 564(b)(1) of the Act, 21 U.S.C.section 360bbb-3(b)(1), unless the authorization is terminated  or revoked sooner.       Influenza A by PCR NEGATIVE NEGATIVE   Influenza B by PCR NEGATIVE NEGATIVE    Comment: (NOTE) The Xpert Xpress SARS-CoV-2/FLU/RSV plus assay is intended as an aid in the diagnosis of influenza from Nasopharyngeal swab specimens and should not be used as a sole basis for treatment. Nasal washings and aspirates are unacceptable for Xpert Xpress SARS-CoV-2/FLU/RSV testing.  Fact Sheet for Patients: BloggerCourse.com  Fact Sheet for Healthcare  Providers: SeriousBroker.it  This test is not yet approved or cleared by the [place] FDA and has been authorized for detection and/or diagnosis of SARS-CoV-2 by FDA under an Emergency Use Authorization (EUA). This EUA will remain in effect (meaning this test can be used) for the duration of the COVID-19 declaration under Section 564(b)(1) of the Act, 21 U.S.C. section 360bbb-3(b)(1), unless the authorization is terminated or revoked.  Performed at [facility], [address], [place], [place] [address]   Urinalysis, Routine w reflex microscopic Nasopharyngeal Swab     Status: Abnormal   Collection Time: 02/21/21  9:29 AM  Result Value Ref Range   Color, Urine STRAW (A) YELLOW   APPearance CLEAR CLEAR   Specific Gravity, Urine 1.002 (L) 1.005 - 1.030   pH 6.0 5.0 -  8.0   Glucose, UA NEGATIVE NEGATIVE mg/dL   Hgb urine dipstick NEGATIVE NEGATIVE   Bilirubin Urine NEGATIVE NEGATIVE   Ketones, ur NEGATIVE NEGATIVE mg/dL   Protein, ur NEGATIVE NEGATIVE mg/dL   Nitrite NEGATIVE NEGATIVE   Leukocytes,Ua NEGATIVE NEGATIVE    Comment: Performed at [facility], [address] [place], [place] [address]  Pregnancy, urine     Status: None   Collection Time: 02/21/21  9:29 AM  Result Value Ref Range   Preg Test, Ur NEGATIVE NEGATIVE    Comment:        THE SENSITIVITY OF THIS METHODOLOGY IS >20 mIU/mL. Performed at [facility], [address] [place], [place] [address]   POCT Urine Drug Screen - (ICup)     Status: Abnormal   Collection Time: 02/21/21  9:31 AM  Result Value Ref Range   POC Amphetamine UR None Detected NONE DETECTED (Cut Off Level 1000 ng/mL)   POC Secobarbital (BAR) None Detected NONE DETECTED (Cut Off Level 300 ng/mL)   POC Buprenorphine (BUP) None Detected NONE DETECTED (Cut Off Level 10 ng/mL)   POC Oxazepam (BZO) None Detected NONE DETECTED (Cut Off Level 300 ng/mL)   POC Cocaine UR Positive (A) NONE DETECTED  (Cut Off Level 300 ng/mL)   POC Methamphetamine UR None Detected NONE DETECTED (Cut Off Level 1000 ng/mL)   POC Morphine None Detected NONE DETECTED (Cut Off Level 300 ng/mL)   POC Oxycodone UR None Detected NONE DETECTED (Cut Off Level 100 ng/mL)   POC Methadone UR None Detected NONE DETECTED (Cut Off Level 300 ng/mL)   POC Marijuana UR None Detected NONE DETECTED (Cut Off Level 50 ng/mL)  CBC with Differential/Platelet     Status: Abnormal   Collection Time: 02/21/21  9:40 AM  Result Value Ref Range   WBC 7.2 4.0 - 10.5 K/uL   RBC 4.34 3.87 - 5.11 MIL/uL   Hemoglobin 14.3 12.0 - 15.0 g/dL   HCT 09.8 11.9 - 14.7 %   MCV 101.2 (H) 80.0 - 100.0 fL   MCH 32.9 26.0 - 34.0 pg   MCHC 32.6 30.0 - 36.0 g/dL   RDW 82.9 56.2 - 13.0 %   Platelets 216 150 - 400 K/uL   nRBC 0.0 0.0 - 0.2 %   Neutrophils Relative % 48 %   Neutro Abs 3.4 1.7 - 7.7 K/uL   Lymphocytes Relative 40 %   Lymphs Abs 2.9 0.7 - 4.0 K/uL   Monocytes Relative 10 %   Monocytes Absolute 0.7 0.1 - 1.0 K/uL   Eosinophils Relative 1 %   Eosinophils Absolute 0.1 0.0 - 0.5 K/uL   Basophils Relative 1 %   Basophils Absolute 0.1 0.0 - 0.1 K/uL   Immature Granulocytes 0 %   Abs Immature Granulocytes 0.02 0.00 - 0.07 K/uL    Comment: Performed at [facility], [address] [place], [place] [address]  Comprehensive metabolic panel     Status: None   Collection Time: 02/21/21  9:40 AM  Result Value Ref Range   Sodium 141 135 - 145 mmol/L   Potassium 3.7 3.5 - 5.1 mmol/L   Chloride 104 98 - 111 mmol/L   CO2 23 22 - 32 mmol/L   Glucose, Bld 81 70 - 99 mg/dL    Comment: Glucose reference range applies only to samples taken after fasting for at least 8 hours.   BUN 6 6 - 20 mg/dL   Creatinine, Ser 4.69 0.44 -  1.00 mg/dL   Calcium 9.5 8.9 - 55.2 mg/dL   Total Protein 7.5 6.5 - 8.1 g/dL   Albumin 4.4 3.5 - 5.0 g/dL   AST 34 15 - 41 U/L   ALT 34 0 - 44 U/L   Alkaline Phosphatase 100 38 - 126 U/L   Total Bilirubin 0.9  0.3 - 1.2 mg/dL   GFR, Estimated >08 >02 mL/min    Comment: (NOTE) Calculated using the CKD-EPI Creatinine Equation (2021)    Anion gap 14 5 - 15    Comment: Performed at [facility], [address], [place], [place] [address]  Ethanol     Status: Abnormal   Collection Time: 02/21/21  9:40 AM  Result Value Ref Range   Alcohol, Ethyl (B) 119 (H) <10 mg/dL    Comment: (NOTE) Lowest detectable limit for serum alcohol is 10 mg/dL.  For medical purposes only. Performed at [facility], [address] [place], [place] [address]   Lipid panel     Status: Abnormal   Collection Time: 02/21/21  9:40 AM  Result Value Ref Range   Cholesterol 214 (H) 0 - 200 mg/dL   Triglycerides 753 <005 mg/dL   HDL 58 >11 mg/dL   Total CHOL/HDL Ratio 3.7 RATIO   VLDL 27 0 - 40 mg/dL   LDL Cholesterol 021 (H) 0 - 99 mg/dL    Comment:        Total Cholesterol/HDL:CHD Risk Coronary Heart Disease Risk Table                     Men   Women  1/2 Average Risk   3.4   3.3  Average Risk       5.0   4.4  2 X Average Risk   9.6   7.1  3 X Average Risk  23.4   11.0        Use the calculated Patient Ratio above and the CHD Risk Table to determine the patient's CHD Risk.        ATP III CLASSIFICATION (LDL):  <100     mg/dL   Optimal  117-356  mg/dL   Near or Above                    Optimal  130-159  mg/dL   Borderline  701-410  mg/dL   High  >301     mg/dL   Very High Performed at [facility], [address], [place], [place] [address]   TSH     Status: None   Collection Time: 02/21/21  9:40 AM  Result Value Ref Range   TSH 1.868 0.350 - 4.500 uIU/mL    Comment: Performed by a 3rd Generation assay with a functional sensitivity of <=0.01 uIU/mL. Performed at [facility], [address] [place], [place] [address]   Pregnancy, urine POC     Status: None   Collection Time: 02/21/21  9:41 AM  Result Value Ref Range   Preg Test, Ur NEGATIVE NEGATIVE    Comment:        THE  SENSITIVITY OF THIS METHODOLOGY IS >24 mIU/mL   POC SARS Coronavirus 2 Ag     Status: None   Collection Time: 02/21/21  9:47 AM  Result Value Ref Range   SARSCOV2ONAVIRUS 2 AG NEGATIVE NEGATIVE    Comment: (NOTE) SARS-CoV-2 antigen NOT DETECTED.   Negative results are presumptive.  Negative results do  not preclude SARS-CoV-2 infection and should not be used as the sole basis for treatment or other patient management decisions, including infection  control decisions, particularly in the presence of clinical signs and  symptoms consistent with COVID-19, or in those who have been in contact with the virus.  Negative results must be combined with clinical observations, patient history, and epidemiological information. The expected result is Negative.  Fact Sheet for Patients: https://www.jennings-kim.com/https://www.fda.gov/media/141569/download  Fact Sheet for Healthcare Providers: https://alexander-rogers.biz/https://www.fda.gov/media/141568/download  This test is not yet approved or cleared by the [place] FDA and  has been authorized for detection and/or diagnosis of SARS-CoV-2 by FDA under an Emergency Use Authorization (EUA).  This EUA will remain in effect (meaning this test can be used) for the duration of  the COV ID-19 declaration under Section 564(b)(1) of the Act, 21 U.S.C. section 360bbb-3(b)(1), unless the authorization is terminated or revoked sooner.      Blood Alcohol level:  Lab Results  Component Value Date   ETH 119 (H) 02/21/2021   ETH 47 (H) 01/25/2020    Metabolic Disorder Labs:  Lab Results  Component Value Date   HGBA1C 4.3 (L) 08/26/2018   MPG 76.71 08/26/2018   No results found for: PROLACTIN Lab Results  Component Value Date   CHOL 214 (H) 02/21/2021   TRIG 135 02/21/2021   HDL 58 02/21/2021   CHOLHDL 3.7 02/21/2021   VLDL 27 02/21/2021   LDLCALC 129 (H) 02/21/2021   LDLCALC 121 (H) 09/30/2017    Current Medications: Current Facility-Administered Medications  Medication Dose Route  Frequency Provider Last Rate Last Admin   acetaminophen (TYLENOL) tablet 650 mg  650 mg Oral Q6H PRN Antonieta Pertlary, Sarissa Dern Lawson, MD       albuterol (VENTOLIN HFA) 108 (90 Base) MCG/ACT inhaler 2 puff  2 puff Inhalation Q4H PRN Antonieta Pertlary, Kailany Dinunzio Lawson, MD       alum & mag hydroxide-simeth (MAALOX/MYLANTA) 200-200-20 MG/5ML suspension 30 mL  30 mL Oral Q4H PRN Antonieta Pertlary, Findlay Dagher Lawson, MD       [START ON 02/22/2021] amLODipine (NORVASC) tablet 10 mg  10 mg Oral Daily Antonieta Pertlary, Maleak Brazzel Lawson, MD       atorvastatin (LIPITOR) tablet 40 mg  40 mg Oral Daily Antonieta Pertlary, Zakiyah Diop Lawson, MD       buPROPion [facility](WELLBUTRIN) tablet 75 mg  75 mg Oral Daily Antonieta Pertlary, Amrit Erck Lawson, MD       cloNIDine (CATAPRES) tablet 0.1 mg  0.1 mg Oral TID PRN Antonieta Pertlary, Hakiem Malizia Lawson, MD       [START ON 02/22/2021] isosorbide mononitrate (IMDUR) 24 hr tablet 60 mg  60 mg Oral Daily Antonieta Pertlary, Adalai Perl Lawson, MD       magnesium hydroxide (MILK OF MAGNESIA) suspension 30 mL  30 mL Oral Daily PRN Antonieta Pertlary, Parilee Hally Lawson, MD       pantoprazole (PROTONIX) EC tablet 40 mg  40 mg Oral q morning Jola Babinskilary, Marlane MingleGreg Lawson, MD       traZODone (DESYREL) tablet 300 mg  300 mg Oral QHS Antonieta Pertlary, Marylou Wages Lawson, MD       PTA Medications: Medications Prior to Admission  Medication Sig Dispense Refill Last Dose   albuterol (VENTOLIN HFA) 108 (90 Base) MCG/ACT inhaler Inhale 2 puffs into the lungs every 4 (four) hours as needed for wheezing or shortness of breath.      amLODipine (NORVASC) 5 MG tablet Take 1 tablet (5 mg total) by mouth daily. 30 tablet 6    atorvastatin (LIPITOR) 40 MG tablet Take 1 tablet (  40 mg total) by mouth daily. 30 tablet 6    [START ON 02/22/2021] buPROPion (WELLBUTRIN XL) 300 MG 24 hr tablet Take 1 tablet (300 mg total) by mouth every morning.      hydrOXYzine (ATARAX/VISTARIL) 25 MG tablet Take 1 tablet (25 mg total) by mouth every 6 (six) hours as needed for anxiety. 30 tablet 0    isosorbide mononitrate (IMDUR) 60 MG 24 hr tablet Take 60 mg by mouth daily.      [START ON 02/22/2021]  isosorbide mononitrate (IMDUR) 60 MG 24 hr tablet Take 1 tablet (60 mg total) by mouth daily.      metoprolol tartrate (LOPRESSOR) 50 MG tablet Take 1 tablet (50 mg total) by mouth 2 (two) times daily.      pantoprazole (PROTONIX) 40 MG tablet Take 40 mg by mouth every morning.      traZODone (DESYREL) 300 MG tablet Take 1 tablet (300 mg total) by mouth at bedtime. 30 tablet 0     Musculoskeletal: Strength & Muscle Tone: within normal limits Gait & Station: normal Patient leans: N/A            Psychiatric Specialty Exam:  Presentation  General Appearance: Disheveled  Eye Contact:Fair  Speech:Normal Rate  Speech Volume:Decreased  Handedness:Right   Mood and Affect  Mood:Anxious; Depressed  Affect:Congruent   Thought Process  Thought Processes:Goal Directed  Duration of Psychotic Symptoms: No data recorded Past Diagnosis of Schizophrenia or Psychoactive disorder: No  Descriptions of Associations:Circumstantial  Orientation:Full (Time, Place and Person)  Thought Content:Rumination  Hallucinations:Hallucinations: Auditory; Visual Description of Auditory Hallucinations: Voices saying "It's ok to kill yourself." Description of Visual Hallucinations: Elephants in the closet  Ideas of Reference:Delusions  Suicidal Thoughts:Suicidal Thoughts: Yes, Active SI Active Intent and/or Plan: With Means to Carry Out  Homicidal Thoughts:Homicidal Thoughts: No   Sensorium  Memory:Immediate Poor; Recent Poor; Remote Poor  Judgment:Impaired  Insight:Lacking   Executive Functions  Concentration:Poor  Attention Span:Poor  Recall:Poor  Fund of Knowledge:Poor  Language:Poor   Psychomotor Activity  Psychomotor Activity:Psychomotor Activity: Increased   Assets  Assets:Desire for Improvement; Resilience   Sleep  Sleep:Sleep: Poor Number of Hours of Sleep: -1    Physical Exam: Physical Exam Vitals and nursing note reviewed.  Constitutional:       Appearance: She is obese.  HENT:     Head: Normocephalic and atraumatic.  Pulmonary:     Effort: Pulmonary effort is normal.  Neurological:     General: No focal deficit present.     Mental Status: She is alert and oriented to person, place, and time.   Review of Systems  All other systems reviewed and are negative. Blood pressure 111/66, pulse 66, temperature 98.3 F (36.8 C), temperature source Oral, resp. rate 16, SpO2 100 %. There is no height or weight on file to calculate BMI.  Treatment Plan Summary: Patient is seen and examined.  Patient is a 55 year old female with the above-stated past psychiatric history with a reported history of bipolar disorder as well as polysubstance use disorders who was admitted secondary to suicidal ideation and depression.  She will be admitted to the hospital.  She will be integrated in the milieu.  She will be encouraged to attend groups.  Given her reported history of bipolar disorder I am going to change her Wellbutrin to short acting Wellbutrin 75 mg p.o. daily and titrate that.  She will be monitored to see if there is any exacerbation of a manic state from the initiation  of antidepressant medications.  Clearly Wellbutrin would have the least possibility of doing this.  We will also continue her medications with regard to hypertension, hyperlipidemia, GERD.  I would recommend stopping the metoprolol.  Given her her cocaine usage there is a fairly significant risk of sudden cardiac abnormalities with the presence of metoprolol and cocaine.  I will put on placed clonidine 0.1 mg p.o. 3 times daily as needed a systolic blood pressure greater than 160.  Review of her admission laboratories revealed essentially normal electrolytes including creatinine at 0.97 and liver function enzymes.  Her lipid panel showed an elevated cholesterol of 214.  CBC showed an elevated MCV at 101.2.  We will supplement her with folic acid as well as thiamine.  Pregnancy test was  negative.  TSH was normal at 1.860.  Respiratory panel was negative for influenza A, B and coronavirus.  Her blood alcohol was 119, and we will also place her on lorazepam 1 mg p.o. every 6 hours as needed a CIWA greater than 10.  Drug screen was positive for cocaine.  Her EKG showed a normal sinus rhythm, but her QTc interval was prolonged at 489.  The electronic medical record also revealed a history of sudden cardiac arrest.  She apparently gone into PEA arrest and was resuscitated by CPR for 5 minutes.  Currently her vital signs are stable, she is afebrile.  Pulse oximetry on room air was 99 to 100%.  She does want to discuss with social work the possibility of residential substance abuse treatment.    Observation Level/Precautions:  Detox 15 minute checks Seizure  Laboratory:  CBC Chemistry Profile HbAIC HCG UDS UA  Psychotherapy:    Medications:    Consultations:    Discharge Concerns:    Estimated LOS:  Other:     Physician Treatment Plan for Primary Diagnosis: <principal problem not specified> Long Term Goal(s): Improvement in symptoms so as ready for discharge  Short Term Goals: Ability to identify changes in lifestyle to reduce recurrence of condition will improve, Ability to verbalize feelings will improve, Ability to disclose and discuss suicidal ideas, Ability to demonstrate self-control will improve, Ability to identify and develop effective coping behaviors will improve, Ability to maintain clinical measurements within normal limits will improve, Compliance with prescribed medications will improve, and Ability to identify triggers associated with substance abuse/mental health issues will improve  Physician Treatment Plan for Secondary Diagnosis: Active Problems:   Bipolar disorder with current episode depressed (HCC)  Long Term Goal(s): Improvement in symptoms so as ready for discharge  Short Term Goals: Ability to identify changes in lifestyle to reduce recurrence of  condition will improve, Ability to verbalize feelings will improve, Ability to disclose and discuss suicidal ideas, Ability to demonstrate self-control will improve, Ability to identify and develop effective coping behaviors will improve, Ability to maintain clinical measurements within normal limits will improve, Compliance with prescribed medications will improve, and Ability to identify triggers associated with substance abuse/mental health issues will improve  I certify that inpatient services furnished can reasonably be expected to improve the patient's condition.    Antonieta Pert, MD 7/1/20223:58 PM
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
## 2021-02-21 NOTE — H&P (Signed)
Psychiatric Admission Assessment Adult  Patient Identification: Amanda [name] MRN:  [id number] Date of Evaluation:  02/21/2021 Chief Complaint:  Bipolar disorder with current episode depressed (HCC) [F31.30] Principal Diagnosis: <principal problem not specified> Diagnosis:  Active ProWarden Fillersblems:   Bipolar disorder with current episode depressed (HCC)  History of Present Illness:  Amanda [name], [phone] F who [facility] ([facility] SI with a plan. Past psychiatric history of bipolar disorder, cocaine use disorder vs cocaine dependence, alcohol use disorder vs alcohol dependence.  Past psychiatric hospitalization: [facility] (11/16/2014 - 11/21/2014) Past suicide attempts: 7 attempts over past 6 months  Patient stated that she attends [facility] for medical and pain, psychiatric management.  Past psych meds: Welbutrin, hydroxyzine, haldol, xanax  Today during interview, patient was laying down in her bed, covered in her blanket. She was pleasant and compliant. This morning the patient stated that she held a knife to her chest with the intent to commit suicide, but stopped and called [employer] because she was afraid to die. Patient stated to this Thereasa Parkinauthor that this was her first suicide attempt, but has had multiple SI for the last 6 months - 1 year. Stated that she has been depressed for 6-7 years. Patient was vague about any inciting event that may has led up to her worsening depression. Stated that she has had increased feeling on hopelessness and that there is nothing else to live for. She stated that she has not been caring for her hygiene, but that she wants to. Also stated that she missed her last outpatient psych appointment (02/20/2021) and that she is not med compliant, due to her depression.  Patient has admitted to AVH "for years", stating that people are talking about her and that people are coming in and out of her closet and  apartment to do drugs which makes her feel unsafe. Stated that the AVH occurs during substance use. She currently is not having AVH.  Patient stated that she abuses alcohol and crack cocaine daily for the last 7-8 months. Stated that she needs a drink in the morning and has developed tremors if she doesn't drink. Stated that she smokes marijuana once in a while.  Currently patient lives alone in an apartment, is unemployed and on disability. Patient stated that she talks to her family weekly, and that it was her brother that encouraged her to seek help. Patient stated that we can call her sister: Amanda [name] [phone] Patient stated that she has no significant other, no children. Patient denied any past physical, emotional, sexual trauma. Patient denied any family psychiatric history, suicide, or substance abuse.   Associated Signs/Symptoms: Depression Symptoms:  depressed mood, anhedonia, insomnia, fatigue, feelings of worthlessness/guilt, hopelessness, recurrent thoughts of death, suicidal thoughts with specific plan, suicidal attempt, anxiety, loss of energy/fatigue, Duration of Depression Symptoms: Greater than two weeks  (Hypo) Manic Symptoms:  Distractibility, Hallucinations, Impulsivity, Irritable Mood, Anxiety Symptoms:  Excessive Worry, Psychotic Symptoms:  Delusions, Hallucinations: Auditory Visual Paranoia, PTSD Symptoms: NA Total Time spent with patient: 20 minutes  Past Psychiatric History:  Past psychiatric hospitalization: [facility] (11/16/2014 - 11/21/2014) Past suicide attempts: 7 attempts over past 6 months  Patient stated that she attends [facility] for medical and pain, psychiatric management.  Past psych meds: Welbutrin, hydroxyzine, haldol, xanax  Is the patient at risk to self? Yes.    Has the patient been a risk to self in the past 6 months? Yes.  Has the patient been a risk to self within the distant past? Yes.    Is the patient a  risk to others? No.  Has the patient been a risk to others in the past 6 months? No.  Has the patient been a risk to others within the distant past? No.   Prior Inpatient Therapy:  y Prior Outpatient Therapy:    Alcohol Screening:   Substance Abuse History in the last 12 months:  Yes.   Consequences of Substance Abuse: Withdrawal Symptoms:   Diaphoresis Headaches Nausea Tremors Previous Psychotropic Medications: Yes  Psychological Evaluations: Yes  Past Medical History:  Past Medical History:  Diagnosis Date   Anxiety    Arthritis    lower back   Asthma    Bipolar disorder (HCC)    Carpal tunnel syndrome, bilateral    Chronic back pain    Depression    Diverticulosis    Fx. left wrist    GERD (gastroesophageal reflux disease)    Heart murmur    "born with"   Heart murmur    HLD (hyperlipidemia)    Hypertension    IBS (irritable bowel syndrome)    Internal hemorrhoids    Muscle spasms of neck    Neuropathy    PUD (peptic ulcer disease)     Past Surgical History:  Procedure Laterality Date   ARTHRODESIS METATARSALPHALANGEAL JOINT (MTPJ) Right 04/29/2016   Procedure: RIGHT GREAT TOE METATARSOPHALANGEAL JOINT (MTPJ) FUSION, HARDWARE REMOVAL;  Surgeon: Tarry Kos, MD;  Location: [facility];  Service: Orthopedics;  Laterality: Right;  RIGHT GREAT TOE METATARSOPHALANGEAL JOINT (MTPJ) FUSION, HARDWARE REMOVAL   CARPAL TUNNEL RELEASE Right 2003   CHOLECYSTECTOMY N/A 02/09/2014   Procedure: LAPAROSCOPIC CHOLECYSTECTOMY WITH INTRAOPERATIVE CHOLANGIOGRAM;  Surgeon: Valarie Merino, MD;  Location: [facility];  Service: General;  Laterality: N/A;   CHOLECYSTECTOMY     FOOT SURGERY     HAMMER TOE FUSION Bilateral 2008   HARDWARE REMOVAL Right 04/29/2016   Procedure: HARDWARE REMOVAL;  Surgeon: Tarry Kos, MD;  Location: [facility];  Service: Orthopedics;  Laterality: Right;  HARDWARE REMOVAL   LEFT HEART CATH AND CORONARY ANGIOGRAPHY N/A 08/30/2018    Procedure: LEFT HEART CATH AND CORONARY ANGIOGRAPHY;  Surgeon: Marykay Lex, MD;  Location: [facility];  Service: Cardiovascular;  Laterality: N/A;   ROTATOR CUFF REPAIR Right 2011   WRIST FRACTURE SURGERY Left    Family History:  Family History  Problem Relation Age of Onset   Pancreatic cancer Mother    CAD Father    Diabetes Mellitus II Father    Heart failure Father    Diabetes Father    Breast cancer Paternal Grandmother    HIV/AIDS Brother    Other Sister        Brain tumor   Colon cancer Neg Hx    Family Psychiatric  History: Patient denied any family psychiatric history, suicide, or substance abuse. Tobacco Screening:   Social History:  Social History   Substance and Sexual Activity  Alcohol Use Yes   Comment: heavily daily     Social History   Substance and Sexual Activity  Drug Use Yes   Types: Cocaine, "Crack" cocaine, Marijuana   Comment: last used two years ago    Additional Social History:                           Allergies:   Allergies  Allergen Reactions   Penicillins Anaphylaxis    Has patient had a PCN reaction causing immediate rash, facial/tongue/throat swelling, SOB or lightheadedness with hypotension: Yes Has patient had a PCN reaction causing severe rash involving mucus membranes or skin necrosis: Yes Has patient had a PCN reaction that required hospitalization Yes Has patient had a PCN reaction occurring within the last 10 years: No-more than 10 years ago If all of the above answers are "NO", then may proceed with Cephalosporin use.    Penicillins Anaphylaxis    DID THE REACTION INVOLVE: Swelling of the face/tongue/throat, SOB, or low BP? Yes Sudden or severe rash/hives, skin peeling, or the inside of the mouth or nose? Yes Did it require medical treatment? No When did it last happen? Within the past 10 years If all above answers are "NO", may proceed with cephalosporin use.     Asa [Aspirin] Rash   Aspirin Rash    Lab Results:  Results for orders placed or performed during the hospital encounter of 02/21/21 (from the past 48 hour(s))  Resp Panel by RT-PCR (Flu A&B, Covid) Nasopharyngeal Swab     Status: None   Collection Time: 02/21/21  9:29 AM   Specimen: Nasopharyngeal Swab; Nasopharyngeal(NP) swabs in vial transport medium  Result Value Ref Range   SARS Coronavirus 2 by RT PCR NEGATIVE NEGATIVE    Comment: (NOTE) SARS-CoV-2 target nucleic acids are NOT DETECTED.  The SARS-CoV-2 RNA is generally detectable in upper respiratory specimens during the acute phase of infection. The lowest concentration of SARS-CoV-2 viral copies this assay can detect is 138 copies/mL. A negative result does not preclude SARS-Cov-2 infection and should not be used as the sole basis for treatment or other patient management decisions. A negative result may occur with  improper specimen collection/handling, submission of specimen other than nasopharyngeal swab, presence of viral mutation(s) within the areas targeted by this assay, and inadequate number of viral copies(<138 copies/mL). A negative result must be combined with clinical observations, patient history, and epidemiological information. The expected result is Negative.  Fact Sheet for Patients:  BloggerCourse.com  Fact Sheet for Healthcare Providers:  SeriousBroker.it  This test is no t yet approved or cleared by the [place] FDA and  has been authorized for detection and/or diagnosis of SARS-CoV-2 by FDA under an Emergency Use Authorization (EUA). This EUA will remain  in effect (meaning this test can be used) for the duration of the COVID-19 declaration under Section 564(b)(1) of the Act, 21 U.S.C.section 360bbb-3(b)(1), unless the authorization is terminated  or revoked sooner.       Influenza A by PCR NEGATIVE NEGATIVE   Influenza B by PCR NEGATIVE NEGATIVE    Comment: (NOTE) The Xpert  Xpress SARS-CoV-2/FLU/RSV plus assay is intended as an aid in the diagnosis of influenza from Nasopharyngeal swab specimens and should not be used as a sole basis for treatment. Nasal washings and aspirates are unacceptable for Xpert Xpress SARS-CoV-2/FLU/RSV testing.  Fact Sheet for Patients: BloggerCourse.com  Fact Sheet for Healthcare Providers: SeriousBroker.it  This test is not yet approved or cleared by the [place] FDA and has been authorized for detection and/or diagnosis of SARS-CoV-2 by FDA under an Emergency Use Authorization (EUA). This EUA will remain in effect (meaning this test can be used) for the duration of the COVID-19 declaration under Section 564(b)(1) of the Act, 21 U.S.C. section 360bbb-3(b)(1), unless the authorization is terminated or revoked.  Performed at [facility], [address], [place], [place]  [address]   Urinalysis, Routine w reflex microscopic Nasopharyngeal Swab     Status: Abnormal   Collection Time: 02/21/21  9:29 AM  Result Value Ref Range   Color, Urine STRAW (A) YELLOW   APPearance CLEAR CLEAR   Specific Gravity, Urine 1.002 (L) 1.005 - 1.030   pH 6.0 5.0 - 8.0   Glucose, UA NEGATIVE NEGATIVE mg/dL   Hgb urine dipstick NEGATIVE NEGATIVE   Bilirubin Urine NEGATIVE NEGATIVE   Ketones, ur NEGATIVE NEGATIVE mg/dL   Protein, ur NEGATIVE NEGATIVE mg/dL   Nitrite NEGATIVE NEGATIVE   Leukocytes,Ua NEGATIVE NEGATIVE    Comment: Performed at [facility], [address], [place], [place] [address]  Pregnancy, urine     Status: None   Collection Time: 02/21/21  9:29 AM  Result Value Ref Range   Preg Test, Ur NEGATIVE NEGATIVE    Comment:        THE SENSITIVITY OF THIS METHODOLOGY IS >20 mIU/mL. Performed at [facility], [address], [place], [place] [address]   POCT Urine Drug Screen - (ICup)     Status: Abnormal   Collection Time: 02/21/21  9:31 AM  Result  Value Ref Range   POC Amphetamine UR None Detected NONE DETECTED (Cut Off Level 1000 ng/mL)   POC Secobarbital (BAR) None Detected NONE DETECTED (Cut Off Level 300 ng/mL)   POC Buprenorphine (BUP) None Detected NONE DETECTED (Cut Off Level 10 ng/mL)   POC Oxazepam (BZO) None Detected NONE DETECTED (Cut Off Level 300 ng/mL)   POC Cocaine UR Positive (A) NONE DETECTED (Cut Off Level 300 ng/mL)   POC Methamphetamine UR None Detected NONE DETECTED (Cut Off Level 1000 ng/mL)   POC Morphine None Detected NONE DETECTED (Cut Off Level 300 ng/mL)   POC Oxycodone UR None Detected NONE DETECTED (Cut Off Level 100 ng/mL)   POC Methadone UR None Detected NONE DETECTED (Cut Off Level 300 ng/mL)   POC Marijuana UR None Detected NONE DETECTED (Cut Off Level 50 ng/mL)  CBC with Differential/Platelet     Status: Abnormal   Collection Time: 02/21/21  9:40 AM  Result Value Ref Range   WBC 7.2 4.0 - 10.5 K/uL   RBC 4.34 3.87 - 5.11 MIL/uL   Hemoglobin 14.3 12.0 - 15.0 g/dL   HCT 82.9 56.2 - 13.0 %   MCV 101.2 (H) 80.0 - 100.0 fL   MCH 32.9 26.0 - 34.0 pg   MCHC 32.6 30.0 - 36.0 g/dL   RDW 86.5 78.4 - 69.6 %   Platelets 216 150 - 400 K/uL   nRBC 0.0 0.0 - 0.2 %   Neutrophils Relative % 48 %   Neutro Abs 3.4 1.7 - 7.7 K/uL   Lymphocytes Relative 40 %   Lymphs Abs 2.9 0.7 - 4.0 K/uL   Monocytes Relative 10 %   Monocytes Absolute 0.7 0.1 - 1.0 K/uL   Eosinophils Relative 1 %   Eosinophils Absolute 0.1 0.0 - 0.5 K/uL   Basophils Relative 1 %   Basophils Absolute 0.1 0.0 - 0.1 K/uL   Immature Granulocytes 0 %   Abs Immature Granulocytes 0.02 0.00 - 0.07 K/uL    Comment: Performed at [facility], [address] [place], [place] [address]  Comprehensive metabolic panel     Status: None   Collection Time: 02/21/21  9:40 AM  Result Value Ref Range   Sodium 141 135 - 145 mmol/L   Potassium 3.7 3.5 - 5.1 mmol/L  Chloride 104 98 - 111 mmol/L   CO2 23 22 - 32 mmol/L   Glucose, Bld 81 70 - 99 mg/dL     Comment: Glucose reference range applies only to samples taken after fasting for at least 8 hours.   BUN 6 6 - 20 mg/dL   Creatinine, Ser 4.09 0.44 - 1.00 mg/dL   Calcium 9.5 8.9 - 81.1 mg/dL   Total Protein 7.5 6.5 - 8.1 g/dL   Albumin 4.4 3.5 - 5.0 g/dL   AST 34 15 - 41 U/L   ALT 34 0 - 44 U/L   Alkaline Phosphatase 100 38 - 126 U/L   Total Bilirubin 0.9 0.3 - 1.2 mg/dL   GFR, Estimated >91 >47 mL/min    Comment: (NOTE) Calculated using the CKD-EPI Creatinine Equation (2021)    Anion gap 14 5 - 15    Comment: Performed at [facility], [address], [place], [place] [address]  Ethanol     Status: Abnormal   Collection Time: 02/21/21  9:40 AM  Result Value Ref Range   Alcohol, Ethyl (B) 119 (H) <10 mg/dL    Comment: (NOTE) Lowest detectable limit for serum alcohol is 10 mg/dL.  For medical purposes only. Performed at [facility], [address], [place], [place] [address]   Lipid panel     Status: Abnormal   Collection Time: 02/21/21  9:40 AM  Result Value Ref Range   Cholesterol 214 (H) 0 - 200 mg/dL   Triglycerides 657 <846 mg/dL   HDL 58 >96 mg/dL   Total CHOL/HDL Ratio 3.7 RATIO   VLDL 27 0 - 40 mg/dL   LDL Cholesterol 295 (H) 0 - 99 mg/dL    Comment:        Total Cholesterol/HDL:CHD Risk Coronary Heart Disease Risk Table                     Men   Women  1/2 Average Risk   3.4   3.3  Average Risk       5.0   4.4  2 X Average Risk   9.6   7.1  3 X Average Risk  23.4   11.0        Use the calculated Patient Ratio above and the CHD Risk Table to determine the patient's CHD Risk.        ATP III CLASSIFICATION (LDL):  <100     mg/dL   Optimal  284-132  mg/dL   Near or Above                    Optimal  130-159  mg/dL   Borderline  440-102  mg/dL   High  >725     mg/dL   Very High Performed at [facility], [address] [place], [place] [address]   TSH     Status: None   Collection Time: 02/21/21  9:40 AM  Result Value Ref Range    TSH 1.868 0.350 - 4.500 uIU/mL    Comment: Performed by a 3rd Generation assay with a functional sensitivity of <=0.01 uIU/mL. Performed at [facility], [address] [place], [place] [address]   Pregnancy, urine POC     Status: None   Collection Time: 02/21/21  9:41 AM  Result Value Ref Range   Preg Test, Ur NEGATIVE NEGATIVE    Comment:        THE SENSITIVITY OF  THIS METHODOLOGY IS >24 mIU/mL   POC SARS Coronavirus 2 Ag     Status: None   Collection Time: 02/21/21  9:47 AM  Result Value Ref Range   SARSCOV2ONAVIRUS 2 AG NEGATIVE NEGATIVE    Comment: (NOTE) SARS-CoV-2 antigen NOT DETECTED.   Negative results are presumptive.  Negative results do not preclude SARS-CoV-2 infection and should not be used as the sole basis for treatment or other patient management decisions, including infection  control decisions, particularly in the presence of clinical signs and  symptoms consistent with COVID-19, or in those who have been in contact with the virus.  Negative results must be combined with clinical observations, patient history, and epidemiological information. The expected result is Negative.  Fact Sheet for Patients: https://www.jennings-kim.com/  Fact Sheet for Healthcare Providers: https://alexander-rogers.biz/  This test is not yet approved or cleared by the [place] FDA and  has been authorized for detection and/or diagnosis of SARS-CoV-2 by FDA under an Emergency Use Authorization (EUA).  This EUA will remain in effect (meaning this test can be used) for the duration of  the COV ID-19 declaration under Section 564(b)(1) of the Act, 21 U.S.C. section 360bbb-3(b)(1), unless the authorization is terminated or revoked sooner.      Blood Alcohol level:  Lab Results  Component Value Date   ETH 119 (H) 02/21/2021   ETH 47 (H) 01/25/2020    Metabolic Disorder Labs:  Lab Results  Component Value Date   HGBA1C 4.3 (L) 08/26/2018    MPG 76.71 08/26/2018   No results found for: PROLACTIN Lab Results  Component Value Date   CHOL 214 (H) 02/21/2021   TRIG 135 02/21/2021   HDL 58 02/21/2021   CHOLHDL 3.7 02/21/2021   VLDL 27 02/21/2021   LDLCALC 129 (H) 02/21/2021   LDLCALC 121 (H) 09/30/2017    Current Medications: Current Facility-Administered Medications  Medication Dose Route Frequency Provider Last Rate Last Admin   acetaminophen (TYLENOL) tablet 650 mg  650 mg Oral Q6H PRN Antonieta Pert, MD   650 mg at 02/21/21 1805   albuterol (VENTOLIN HFA) 108 (90 Base) MCG/ACT inhaler 2 puff  2 puff Inhalation Q4H PRN Antonieta Pert, MD       alum & mag hydroxide-simeth (MAALOX/MYLANTA) 200-200-20 MG/5ML suspension 30 mL  30 mL Oral Q4H PRN Antonieta Pert, MD       [START ON 02/22/2021] amLODipine (NORVASC) tablet 10 mg  10 mg Oral Daily Antonieta Pert, MD       atorvastatin (LIPITOR) tablet 40 mg  40 mg Oral Daily Antonieta Pert, MD       buPROPion [facility]) tablet 75 mg  75 mg Oral Daily Antonieta Pert, MD       cloNIDine (CATAPRES) tablet 0.1 mg  0.1 mg Oral TID PRN Antonieta Pert, MD       feeding supplement (ENSURE ENLIVE / ENSURE PLUS) liquid 237 mL  237 mL Oral BID BM Antonieta Pert, MD   237 mL at 02/21/21 1728   [START ON 02/22/2021] isosorbide mononitrate (IMDUR) 24 hr tablet 60 mg  60 mg Oral Daily Antonieta Pert, MD       LORazepam (ATIVAN) tablet 1 mg  1 mg Oral Q6H PRN Antonieta Pert, MD       magnesium hydroxide (MILK OF MAGNESIA) suspension 30 mL  30 mL Oral Daily PRN Antonieta Pert, MD       metoprolol tartrate (LOPRESSOR) tablet 50 mg  50 mg Oral BID Antonieta Pert, MD       pantoprazole (PROTONIX) EC tablet 40 mg  40 mg Oral q morning Jola Babinski Marlane Mingle, MD       risperiDONE (RISPERDAL M-TABS) disintegrating tablet 2 mg  2 mg Oral Q8H PRN Antonieta Pert, MD       And   ziprasidone (GEODON) injection 20 mg  20 mg Intramuscular Q12H PRN Antonieta Pert, MD       traZODone (DESYREL) tablet 300 mg  300 mg Oral QHS Antonieta Pert, MD       PTA Medications: Medications Prior to Admission  Medication Sig Dispense Refill Last Dose   albuterol (VENTOLIN HFA) 108 (90 Base) MCG/ACT inhaler Inhale 2 puffs into the lungs every 4 (four) hours as needed for wheezing or shortness of breath.      amLODipine (NORVASC) 5 MG tablet Take 1 tablet (5 mg total) by mouth daily. 30 tablet 6    atorvastatin (LIPITOR) 40 MG tablet Take 1 tablet (40 mg total) by mouth daily. 30 tablet 6    [START ON 02/22/2021] buPROPion (WELLBUTRIN XL) 300 MG 24 hr tablet Take 1 tablet (300 mg total) by mouth every morning.      hydrOXYzine (ATARAX/VISTARIL) 25 MG tablet Take 1 tablet (25 mg total) by mouth every 6 (six) hours as needed for anxiety. 30 tablet 0    isosorbide mononitrate (IMDUR) 60 MG 24 hr tablet Take 60 mg by mouth daily.      [START ON 02/22/2021] isosorbide mononitrate (IMDUR) 60 MG 24 hr tablet Take 1 tablet (60 mg total) by mouth daily.      metoprolol tartrate (LOPRESSOR) 50 MG tablet Take 1 tablet (50 mg total) by mouth 2 (two) times daily.      pantoprazole (PROTONIX) 40 MG tablet Take 40 mg by mouth every morning.      traZODone (DESYREL) 300 MG tablet Take 1 tablet (300 mg total) by mouth at bedtime. 30 tablet 0     Musculoskeletal: Strength & Muscle Tone: within normal limits Gait & Station: normal Patient leans: N/A            Psychiatric Specialty Exam:  Presentation  General Appearance: Disheveled  Eye Contact:Fair  Speech:Normal Rate  Speech Volume:Decreased  Handedness: Right  Mood and Affect  Mood:Anxious; Depressed  Affect:Congruent   Thought Process  Thought Processes:Goal Directed  Duration of Psychotic Symptoms: No data recorded Past Diagnosis of Schizophrenia or Psychoactive disorder: No  Descriptions of Associations:Circumstantial  Orientation:Full (Time, Place and Person)  Thought  Content:Rumination  Hallucinations:Hallucinations: Auditory; Visual Description of Auditory Hallucinations: Voices saying "It's ok to kill yourself." Description of Visual Hallucinations: Elephants in the closet  Ideas of Reference:Delusions Suicidal Thoughts:Suicidal Thoughts: Yes, Active SI Active Intent and/or Plan: With Means to Carry Out  Homicidal Thoughts:Homicidal Thoughts: No  Sensorium  Memory:Immediate Poor; Recent Poor; Remote Poor  Judgment:Impaired  Insight:Lacking   Executive Functions  Concentration:Poor  Attention Span:Poor  Recall:Poor  Fund of Knowledge:Poor  Language:Poor   Psychomotor Activity  Psychomotor Activity:Psychomotor Activity: Increased   Assets  Assets:Desire for Improvement; Resilience   Sleep  Sleep:Sleep: Poor Number of Hours of Sleep: -1    Physical Exam: Physical Exam HENT:     Head: Normocephalic.     Nose: Congestion and rhinorrhea present.  Pulmonary:     Effort: Pulmonary effort is normal.  Neurological:     Mental Status: She is alert and oriented to person, place, and  time.   Review of Systems  Constitutional:  Positive for diaphoresis and malaise/fatigue.  HENT:  Positive for congestion.   Eyes:  Negative for blurred vision.  Respiratory:  Negative for cough and shortness of breath.   Gastrointestinal:  Positive for heartburn and nausea. Negative for vomiting.  Musculoskeletal:  Positive for myalgias.  Neurological:  Negative for dizziness, weakness and headaches.  Psychiatric/Behavioral:  Positive for depression, hallucinations, substance abuse and suicidal ideas. The patient is nervous/anxious and has insomnia.   Blood pressure (!) 92/57, pulse 64, temperature 98.8 F (37.1 C), temperature source Oral, resp. rate 18, height 5\' 7"  (1.702 m), weight 87.1 kg, SpO2 99 %. Body mass index is 30.07 kg/m.  Treatment Plan Summary:  History of Present Illness:  Amanda [name], [phone] F who was brought to  [facility] (02/21/2021) by [facility] police for SI with a plan. Past psychiatric history of bipolar disorder, cocaine use disorder vs cocaine dependence, alcohol use disorder vs alcohol dependence.  Past psychiatric hospitalization: [facility] (11/16/2014 - 11/21/2014) Past suicide attempts: 7 attempts over past 6 months  Patient stated that she attends [facility] for medical and pain, psychiatric management.  Past psych meds: Welbutrin, hydroxyzine, haldol, xanax  (02/21/2021) TSH: 1.868 QTc: 489 Na: 141 Blood alcohol: 119 Elevated MCV 101.2 Elevated Cholesterol 214 and LDL 129 Positive urine drug screen: cocaine  Daily contact with patient to assess and evaluate symptoms and progress in treatment, Medication management, and Plan .  Plan: Depression Wellbutrin 75mg  PO daily titrating Clonidine 0.1mg  PO TID as needed, systolic blood pressure greater than 160 Continue home medications Alcohol withdrawal Folate and thiamine Lorazepam 1mg  PO every 6 hours as needed a CIWA greater than 10   Observation Level/Precautions:  15 minute checks  Laboratory:  CBC  Psychotherapy:    Medications:    Consultations:    Discharge Concerns:    Estimated LOS:  Other:     Physician Treatment Plan for Primary Diagnosis: <principal problem not specified> Long Term Goal(s): Improvement in symptoms so as ready for discharge  Short Term Goals: Ability to identify changes in lifestyle to reduce recurrence of condition will improve, Ability to verbalize feelings will improve, Ability to demonstrate self-control will improve, and Compliance with prescribed medications will improve  Physician Treatment Plan for Secondary Diagnosis: Active Problems:   Bipolar disorder with current episode depressed (HCC)  Long Term Goal(s): Improvement in symptoms so as ready for discharge  Short Term Goals: Ability to identify changes in lifestyle to reduce recurrence of condition  will improve, Ability to verbalize feelings will improve, Ability to disclose and discuss suicidal ideas, Ability to demonstrate self-control will improve, Ability to identify and develop effective coping behaviors will improve, Ability to maintain clinical measurements within normal limits will improve, Compliance with prescribed medications will improve, and Ability to identify triggers associated with substance abuse/mental health issues will improve  I certify that inpatient services furnished can reasonably be expected to improve the patient's condition.    04/24/2021, DO 7/1/20228:23 PM

## 2021-02-22 MED ORDER — METHOCARBAMOL 500 MG PO TABS
500.0000 mg | ORAL_TABLET | Freq: Three times a day (TID) | ORAL | Status: DC | PRN
  Administered 2021-02-22 – 2021-02-23 (×2): 500 mg via ORAL
  Filled 2021-02-22: qty 1

## 2021-02-22 MED ORDER — VITAMIN D (ERGOCALCIFEROL) 1.25 MG (50000 UNIT) PO CAPS
50000.0000 [IU] | ORAL_CAPSULE | ORAL | Status: DC
  Administered 2021-02-22 – 2021-03-01 (×2): 50000 [IU] via ORAL
  Filled 2021-02-22 (×2): qty 1

## 2021-02-22 MED ORDER — METHOCARBAMOL 500 MG PO TABS
ORAL_TABLET | ORAL | Status: AC
  Filled 2021-02-22: qty 1

## 2021-02-22 MED ORDER — AMLODIPINE BESYLATE 5 MG PO TABS
5.0000 mg | ORAL_TABLET | Freq: Every day | ORAL | Status: DC
  Administered 2021-02-23 – 2021-03-04 (×10): 5 mg via ORAL
  Filled 2021-02-22 (×12): qty 1

## 2021-02-22 MED ORDER — TRAMADOL HCL 50 MG PO TABS
50.0000 mg | ORAL_TABLET | Freq: Three times a day (TID) | ORAL | Status: DC | PRN
Start: 2021-02-22 — End: 2021-02-23
  Filled 2021-02-22: qty 1

## 2021-02-22 MED ORDER — PREGABALIN 75 MG PO CAPS: 150.0000 mg | ORAL_CAPSULE | Freq: Every day | ORAL | Status: DC

## 2021-02-22 MED ORDER — METOPROLOL TARTRATE 25 MG PO TABS
25.0000 mg | ORAL_TABLET | Freq: Two times a day (BID) | ORAL | Status: DC
  Administered 2021-02-22 – 2021-03-04 (×20): 25 mg via ORAL
  Filled 2021-02-22 (×26): qty 1

## 2021-02-22 MED ORDER — PREGABALIN 75 MG PO CAPS: 75.0000 mg | ORAL_CAPSULE | Freq: Every day | ORAL | Status: DC

## 2021-02-22 MED ORDER — ISOSORBIDE MONONITRATE ER 60 MG PO TB24
80.0000 mg | ORAL_TABLET | Freq: Every day | ORAL | Status: DC
  Administered 2021-02-23 – 2021-03-04 (×10): 75 mg via ORAL
  Filled 2021-02-22 (×12): qty 1

## 2021-02-22 MED ORDER — CARBAMAZEPINE 100 MG PO CHEW
200.0000 mg | CHEWABLE_TABLET | Freq: Two times a day (BID) | ORAL | Status: DC
  Administered 2021-02-22 – 2021-02-27 (×9): 200 mg via ORAL
  Filled 2021-02-22 (×14): qty 2

## 2021-02-22 NOTE — Progress Notes (Signed)
   02/22/21 1200  Psych Admission Type (Psych Patients Only)  Admission Status Voluntary  Psychosocial Assessment  Patient Complaints Substance abuse;Anxiety;Depression  Eye Contact Fair  Facial Expression Sad  Affect Sad;Depressed  Speech Unremarkable  Interaction Guarded  Motor Activity Slow  Appearance/Hygiene Unremarkable  Behavior Characteristics Cooperative;Anxious  Mood Depressed  Thought Process  Coherency WDL  Content Blaming self  Delusions WDL  Perception Hallucinations  Hallucination None reported or observed  Judgment Poor  Confusion WDL  Danger to Self  Current suicidal ideation? Active  Self-Injurious Behavior No self-injurious ideation or behavior indicators observed or expressed   Agreement Not to Harm Self Yes  Description of Agreement verbal agreement to contract for safety  Danger to Others  Danger to Others None reported or observed

## 2021-02-22 NOTE — Progress Notes (Signed)
Psychoeducational Group Note  Date:  02/22/2021 Time:  2015  Group Topic/Focus:  Wrap up group  Participation Level: Did Not Attend  Participation Quality:  Not Applicable  Affect:  Not Applicable  Cognitive:  Not Applicable  Insight:  Not Applicable  Engagement in Group: Not Applicable  Additional Comments:  Did not attend.  Marcille Buffy 02/22/2021, 8:44 PM

## 2021-02-22 NOTE — BHH Counselor (Signed)
Adult Comprehensive Assessment  Patient ID: Amanda [name], female   DOB: [birth year], 55 y.o.   MRN: [id number]  Information Source: Information source: Patient  Summary/Recommendations:   Summary and Recommendations (to be completed by the evaluator): Pt is a 54yo female living in [place], [place] [facility] [place]) alone. She reports that she is single with no children and no supports in [place]. She states that her family live in [place] and identified her sister as her primary emotional support. PT is on disability "for mental illness" and reports daily crack cocaine/alcohol/marijuana use for the past few years with no period of sobriety in that time. Pt states she is depressed and suicidal due to her chronic substance abuse, lack of social supports, and dangerous living environment. Pt reports a history of bipolar disorder as well. She states that she has been psychiatrically hospitalized in the past but cannot recall time frame or locations. She reports a history at [facility] for inpatient rehab, [facility] and [facility] for outpatient care, with no current providers. Pt declined [facility] or any rescue mission referrals and is requesting "a new living situation where I can get mental health and substance abuse treatment longterm and where I don't have to work." Pt was resistant to CSW setting realistic expectations with her. MD notified of pt's requests. BATS was reviewed with pt--she agreed to sign consent for [facility]ATS referral but would not sign any other consents for outpatient medication management at this time--pt continues to present as confused, irritable, and somewhat paranoid. CSW continuing to assess for appropriate referrals.  Current Stressors:  Patient states their primary concerns and needs for treatment are:: substance abuse (crack cocaine/alcohol/marijuana)-ongoing use for several years without any substantial period of abstinence. feels unsafe in present living  situation in apartment. states the area is dangerous and she does not want to return. Patient states their goals for this hospitilization and ongoing recovery are:: "to get into a program that addresses my mental illness and substance use. I can't work because I have neuropathy and I'm older. I need a long term place that helps with housing." CSW attempted to set appropriate expectations with pt, however pt was adament that "there must be a place available like this for you to send me to." CSW discussed [facility] program, as pt declined [facility] and [facility]. She also declined [facility] because "I can't be working and on my Surveyor, quantity / Learning stressors: graduated high school Employment / Job issues: on disability x 3/4 years "for my mental illness." Family Relationships: "All my family lives in [place]. I'm alone down here." Financial / Lack of resources (include bankruptcy): reports that she has medicaid, [employer], and gets disability income. Housing / Lack of housing: lives alone in an apartment. states that "it is dangerous where I live." Pt states "How do I get my clothes from the apartment if I'm scared to go back." CSW suggested a friend get them for her or pt take it as a loss if she feels that unsafe returning. Physical health (include injuries & life threatening diseases): pt reports that she has neuropathy in her feet; of note: pt told CSW that she was older than her actual age. Pt is 54yo but told CSW she was "in my 42's." Social relationships: poor-pt could not identify any positive relationships. states that her sister is a positive support and lives in [place]--pt gave permission for CSW to contact her sister for SPE and to provide any information. Substance abuse:  crack cocaine, alcohol, and marijuana use daily for past several years without any period of sobriety per pt. "it's no way to live." Bereavement / Loss: n/a  Living/Environment/Situation:  Living  Arrangements: Alone Living conditions (as described by patient or guardian): pt reports that she lives alone in an apartment in [place], [place] [facility] [place]) Who else lives in the home?: n/a How long has patient lived in current situation?: few years What is atmosphere in current home: Dangerous  Family History:  Marital status: Single Are you sexually active?: No What is your sexual orientation?: heterosexual Has your sexual activity been affected by drugs, alcohol, medication, or emotional stress?: n/a Does patient have children?: No  Childhood History:  By whom was/is the patient raised?: Both parents Additional childhood history information: pt delcined to discuss Description of patient's relationship with caregiver when they were a child: n/a Patient's description of current relationship with people who raised him/her: n/a. close to older sister How were you disciplined when you got in trouble as a child/adolescent?: n/a Does patient have siblings?: Yes Number of Siblings: 1 Description of patient's current relationship with siblings: Amanda [name] ([phone]) pt is close to her sister; sister lives in [place] Did patient suffer any verbal/emotional/physical/sexual abuse as a child?: No Did patient suffer from severe childhood neglect?: No Has patient ever been sexually abused/assaulted/raped as an adolescent or adult?: No Type of abuse, by whom, and at what age: n/a Was the patient ever a victim of a crime or a disaster?: No Patient description of being a victim of a crime or disaster: n/a How has this affected patient's relationships?: n/a Spoken with a professional about abuse?: No Does patient feel these issues are resolved?: No Witnessed domestic violence?: No Has patient been affected by domestic violence as an adult?: No  Education:  Highest grade of school patient has completed: graduated high school Currently a Consulting civil engineer?: No Learning disability?:  No  Employment/Work Situation:   Employment Situation: On disability Why is Patient on Disability: "for mental health issues." How Long has Patient Been on Disability: 3-4 years per pt Patient's Job has Been Impacted by Current Illness: Yes Describe how Patient's Job has Been Impacted: "I can't function." What is the Longest Time Patient has Held a Job?: few years Where was the Patient Employed at that Time?: did not disclose Has Patient ever Been in the [employer]?: No  Financial Resources:   Surveyor, quantity resources: Insurance claims handler, [employer], Medicare Does patient have a Lawyer or guardian?: No  Alcohol/Substance Abuse:   What has been your use of drugs/alcohol within the last 12 months?: pt reports heavy crack cocaine, alcohol, and marijuana use for the past 2-3 years without any period of sobriety. If attempted suicide, did drugs/alcohol play a role in this?: Yes ("I felt hopeless and held a knife to my chest. The police brought me in.") Alcohol/Substance Abuse Treatment Hx: Past Tx, Inpatient, Past Tx, Outpatient If yes, describe treatment: Pt reports a history of psychiatric hospitalization/detox but cannot recall location and time frame. she reports going to [facility] in the past ("I don't want to go back there.") She reports a history at [facility] with most recent outpatient follow-up at [facility] outpatient. Pt declined to sign consent stating, "I don't want them knowing my business." (Despite CSW attempting to explain that if they are prescriving her psychiatric medications, it would be imperative for them to have hsopital records). Pt remains somewhat paranoid and confused. Has alcohol/substance abuse ever caused legal problems?: No  Social Support System:   Patient's Community Support System: Poor Describe Community Support System: no identified local supports. family is in [place]. Pt identified her sister as primary support Type of faith/religion: not  discussed How does patient's faith help to cope with current illness?: n/a  Leisure/Recreation:   Do You Have Hobbies?: No Leisure and Hobbies: getting high  Strengths/Needs:   What is the patient's perception of their strengths?: "I don't know. I just want help." Patient states they can use these personal strengths during their treatment to contribute to their recovery: "I don't know." Patient states these barriers may affect/interfere with their treatment: Unsure of resources. pt has unrealistic expectations for treatment at this time. CSW attempted to review available options. Pt is persistant that she wants "a place that can give me housing, without having to do work, and treatment for both substance abuse and mental illness. I don't want to ever go back home." She declined [facility] and [facility]; Secretary/administrator. Pt provided with BATS information and was informed of the process and PT WAS TOLD THAT THIS WAS NOT A GUARENTEE AND SHE NEEDED TO START THINKING ABOUT OTHER OPTIONS. Pt remains depressed with SI due to social situation/lack of resources and acute physical/psychological withdrawal from drugs and alcohol. Patient states these barriers may affect their return to the community: "i don't want to go back home. It's just too dangerous." "I have noone here in [place] to help me." Other important information patient would like considered in planning for their treatment: n/a  Discharge Plan:   Currently receiving community mental health services: No (pt reports that she was last seen by [facility] but does not want records sent there at this time. history at [facility] as well.) Patient states concerns and preferences for aftercare planning are: Pt has unrealistic expecations for aftercare-CSW attempted to adjust these expectations and review options. Pt declined rescue missions, [facility] and [address]. She is wanting a new place to live that offers mental health care and substance abuse  treatment. CSW provided pt with BATS information but was clear with her that the admissions process can be cumbersome and she may or may not meet criteria/there may or may not be available beds. Pt was advised to start thinking of other options/reach out to family/friends. Patient states they will know when they are safe and ready for discharge when: "When I'm no longer suicidal and have a safe place to go." Does patient have access to transportation?: Yes (bus) Does patient have financial barriers related to discharge medications?: No (disability check/medicaid/managed medicare) Patient description of barriers related to discharge medications: n/a Will patient be returning to same living situation after discharge?:  (unknown at this time)   Rona Ravens. 02/22/2021

## 2021-02-22 NOTE — Progress Notes (Signed)
[facility] Progress Note  02/22/2021 10:51 AM Warden FillersDonna A Bracher  MRN:  [id number] Subjective: Patient is a 55 year old female with a reported past psychiatric history significant for bipolar disorder, cocaine use disorder, cocaine dependence, alcohol dependence, alcohol use disorder, benzodiazepine use disorder, opiate use disorder who was admitted on 02/21/2021 with suicidal ideation.  Objective: Patient is seen and examined.  Patient is a 55 year old female with the above-stated past psychiatric history who is seen in follow-up.  She stated she does not feel good today.  She stated that her head feels funny.  Her blood pressure was a little low this morning, and she was bradycardic.  Her amlodipine and metoprolol were both cut in half.  Her primary focus is trying to find another place to live.  She stated she still has suicidal ideation.  She stated she had some degree of chest pain this morning.  She has a history of acute cardiac arrest, but review of the electronic medical record revealed that was secondary to respiratory issues with regard to cardiac issues.  She continues to complain of musculoskeletal pain as well.  She continues to endorse suicidal ideation.  Her blood pressure this morning was 103/62, and recheck was 107/55.  She was bradycardic with a rate between 58 and 57.  Temperature was normal at 98.1.  Pulse oximetry on room air was 99%.  Review of her admission laboratories again revealed essentially normal electrolytes, elevated cholesterol and elevated LDL.  CBC was essentially normal except for the MCV.  Urinalysis was essentially normal.  Drug screen was positive for cocaine.  EKG showed a sinus rhythm with a prolonged QTc interval at 489.   Principal Problem: <principal problem not specified> Diagnosis: Active Problems:   Bipolar disorder with current episode depressed (HCC)  Total Time spent with patient: 20 minutes  Past Psychiatric History: See admission H&P  Past Medical History:   Past Medical History:  Diagnosis Date   Anxiety    Arthritis    lower back   Asthma    Bipolar disorder (HCC)    Carpal tunnel syndrome, bilateral    Chronic back pain    Depression    Diverticulosis    Fx. left wrist    GERD (gastroesophageal reflux disease)    Heart murmur    "born with"   Heart murmur    HLD (hyperlipidemia)    Hypertension    IBS (irritable bowel syndrome)    Internal hemorrhoids    Muscle spasms of neck    Neuropathy    PUD (peptic ulcer disease)     Past Surgical History:  Procedure Laterality Date   ARTHRODESIS METATARSALPHALANGEAL JOINT (MTPJ) Right 04/29/2016   Procedure: RIGHT GREAT TOE METATARSOPHALANGEAL JOINT (MTPJ) FUSION, HARDWARE REMOVAL;  Surgeon: Tarry KosNaiping M Xu, MD;  Location: [facility];  Service: Orthopedics;  Laterality: Right;  RIGHT GREAT TOE METATARSOPHALANGEAL JOINT (MTPJ) FUSION, HARDWARE REMOVAL   CARPAL TUNNEL RELEASE Right 2003   CHOLECYSTECTOMY N/A 02/09/2014   Procedure: LAPAROSCOPIC CHOLECYSTECTOMY WITH INTRAOPERATIVE CHOLANGIOGRAM;  Surgeon: Valarie MerinoMatthew B Martin, MD;  Location: [facility];  Service: General;  Laterality: N/A;   CHOLECYSTECTOMY     FOOT SURGERY     HAMMER TOE FUSION Bilateral 2008   HARDWARE REMOVAL Right 04/29/2016   Procedure: HARDWARE REMOVAL;  Surgeon: Tarry KosNaiping M Xu, MD;  Location: [facility];  Service: Orthopedics;  Laterality: Right;  HARDWARE REMOVAL   LEFT HEART CATH AND CORONARY ANGIOGRAPHY N/A 08/30/2018   Procedure: LEFT HEART CATH  AND CORONARY ANGIOGRAPHY;  Surgeon: Marykay Lex, MD;  Location: [facility];  Service: Cardiovascular;  Laterality: N/A;   ROTATOR CUFF REPAIR Right 2011   WRIST FRACTURE SURGERY Left    Family History:  Family History  Problem Relation Age of Onset   Pancreatic cancer Mother    CAD Father    Diabetes Mellitus II Father    Heart failure Father    Diabetes Father    Breast cancer Paternal Grandmother    HIV/AIDS Brother    Other Sister         Brain tumor   Colon cancer Neg Hx    Family Psychiatric  History: See admission H&P Social History:  Social History   Substance and Sexual Activity  Alcohol Use Yes   Comment: heavily daily     Social History   Substance and Sexual Activity  Drug Use Yes   Types: Cocaine, "Crack" cocaine, Marijuana   Comment: last used two years ago    Social History   Socioeconomic History   Marital status: Single    Spouse name: Not on file   Number of children: 0   Years of education: 12   Highest education level: Not on file  Occupational History   Occupation: Cook    Comment: [employer]  Tobacco Use   Smoking status: Light Smoker    Pack years: 0.00    Types: Cigarettes   Smokeless tobacco: Never   Tobacco comments:    "takes a puff sometimes"  Substance and Sexual Activity   Alcohol use: Yes    Comment: heavily daily   Drug use: Yes    Types: Cocaine, "Crack" cocaine, Marijuana    Comment: last used two years ago   Sexual activity: Yes    Partners: Female  Other Topics Concern   Not on file  Social History Narrative   ** Merged History Encounter **       Pt lives at home alone. She has a HS education level and does not have any children.  She drinks 2 cups of caffeine daily.   Social Determinants of Health   Financial Resource Strain: Not on file  Food Insecurity: Not on file  Transportation Needs: Not on file  Physical Activity: Not on file  Stress: Not on file  Social Connections: Not on file   Additional Social History:                         Sleep: Fair  Appetite:  Fair  Current Medications: Current Facility-Administered Medications  Medication Dose Route Frequency Provider Last Rate Last Admin   acetaminophen (TYLENOL) tablet 650 mg  650 mg Oral Q6H PRN Antonieta Pert, MD   650 mg at 02/22/21 0936   albuterol (VENTOLIN HFA) 108 (90 Base) MCG/ACT inhaler 2 puff  2 puff Inhalation Q4H PRN Antonieta Pert, MD       alum & mag  hydroxide-simeth (MAALOX/MYLANTA) 200-200-20 MG/5ML suspension 30 mL  30 mL Oral Q4H PRN Antonieta Pert, MD       [START ON 02/23/2021] amLODipine (NORVASC) tablet 5 mg  5 mg Oral Daily Antonieta Pert, MD       atorvastatin (LIPITOR) tablet 40 mg  40 mg Oral Daily Antonieta Pert, MD   40 mg at 02/22/21 0900   buPROPion [facility]) tablet 75 mg  75 mg Oral Daily Antonieta Pert, MD   75 mg at 02/22/21 0900  cloNIDine (CATAPRES) tablet 0.1 mg  0.1 mg Oral TID PRN Antonieta Pert, MD       feeding supplement (ENSURE ENLIVE / ENSURE PLUS) liquid 237 mL  237 mL Oral BID BM Antonieta Pert, MD   237 mL at 02/22/21 0939   isosorbide mononitrate (IMDUR) 24 hr tablet 60 mg  60 mg Oral Daily Antonieta Pert, MD   60 mg at 02/22/21 0900   LORazepam (ATIVAN) tablet 1 mg  1 mg Oral Q6H PRN Antonieta Pert, MD   1 mg at 02/22/21 0011   magnesium hydroxide (MILK OF MAGNESIA) suspension 30 mL  30 mL Oral Daily PRN Antonieta Pert, MD       melatonin tablet 5 mg  5 mg Oral QHS PRN Bobbitt, Shalon E, NP   5 mg at 02/22/21 0011   metoprolol tartrate (LOPRESSOR) tablet 25 mg  25 mg Oral BID Antonieta Pert, MD       pantoprazole (PROTONIX) EC tablet 40 mg  40 mg Oral q morning Antonieta Pert, MD   40 mg at 02/22/21 0900   risperiDONE (RISPERDAL M-TABS) disintegrating tablet 2 mg  2 mg Oral Q8H PRN Antonieta Pert, MD       And   ziprasidone (GEODON) injection 20 mg  20 mg Intramuscular Q12H PRN Antonieta Pert, MD       traZODone (DESYREL) tablet 300 mg  300 mg Oral QHS Antonieta Pert, MD        Lab Results:  Results for orders placed or performed during the hospital encounter of 02/21/21 (from the past 48 hour(s))  Resp Panel by RT-PCR (Flu A&B, Covid) Nasopharyngeal Swab     Status: None   Collection Time: 02/21/21  9:29 AM   Specimen: Nasopharyngeal Swab; Nasopharyngeal(NP) swabs in vial transport medium  Result Value Ref Range   SARS Coronavirus 2 by RT PCR  NEGATIVE NEGATIVE    Comment: (NOTE) SARS-CoV-2 target nucleic acids are NOT DETECTED.  The SARS-CoV-2 RNA is generally detectable in upper respiratory specimens during the acute phase of infection. The lowest concentration of SARS-CoV-2 viral copies this assay can detect is 138 copies/mL. A negative result does not preclude SARS-Cov-2 infection and should not be used as the sole basis for treatment or other patient management decisions. A negative result may occur with  improper specimen collection/handling, submission of specimen other than nasopharyngeal swab, presence of viral mutation(s) within the areas targeted by this assay, and inadequate number of viral copies(<138 copies/mL). A negative result must be combined with clinical observations, patient history, and epidemiological information. The expected result is Negative.  Fact Sheet for Patients:  BloggerCourse.com  Fact Sheet for Healthcare Providers:  SeriousBroker.it  This test is no t yet approved or cleared by the [place] FDA and  has been authorized for detection and/or diagnosis of SARS-CoV-2 by FDA under an Emergency Use Authorization (EUA). This EUA will remain  in effect (meaning this test can be used) for the duration of the COVID-19 declaration under Section 564(b)(1) of the Act, 21 U.S.C.section 360bbb-3(b)(1), unless the authorization is terminated  or revoked sooner.       Influenza A by PCR NEGATIVE NEGATIVE   Influenza B by PCR NEGATIVE NEGATIVE    Comment: (NOTE) The Xpert Xpress SARS-CoV-2/FLU/RSV plus assay is intended as an aid in the diagnosis of influenza from Nasopharyngeal swab specimens and should not be used as a sole basis for treatment. Nasal washings and aspirates  are unacceptable for Xpert Xpress SARS-CoV-2/FLU/RSV testing.  Fact Sheet for Patients: BloggerCourse.com  Fact Sheet for Healthcare  Providers: SeriousBroker.it  This test is not yet approved or cleared by the [place] FDA and has been authorized for detection and/or diagnosis of SARS-CoV-2 by FDA under an Emergency Use Authorization (EUA). This EUA will remain in effect (meaning this test can be used) for the duration of the COVID-19 declaration under Section 564(b)(1) of the Act, 21 U.S.C. section 360bbb-3(b)(1), unless the authorization is terminated or revoked.  Performed at [facility], [address] [place], [place] [address]   Urinalysis, Routine w reflex microscopic Nasopharyngeal Swab     Status: Abnormal   Collection Time: 02/21/21  9:29 AM  Result Value Ref Range   Color, Urine STRAW (A) YELLOW   APPearance CLEAR CLEAR   Specific Gravity, Urine 1.002 (L) 1.005 - 1.030   pH 6.0 5.0 - 8.0   Glucose, UA NEGATIVE NEGATIVE mg/dL   Hgb urine dipstick NEGATIVE NEGATIVE   Bilirubin Urine NEGATIVE NEGATIVE   Ketones, ur NEGATIVE NEGATIVE mg/dL   Protein, ur NEGATIVE NEGATIVE mg/dL   Nitrite NEGATIVE NEGATIVE   Leukocytes,Ua NEGATIVE NEGATIVE    Comment: Performed at [facility], [address], [place], [place] [address]  Pregnancy, urine     Status: None   Collection Time: 02/21/21  9:29 AM  Result Value Ref Range   Preg Test, Ur NEGATIVE NEGATIVE    Comment:        THE SENSITIVITY OF THIS METHODOLOGY IS >20 mIU/mL. Performed at [facility], [address] [place], [place] [address]   POCT Urine Drug Screen - (ICup)     Status: Abnormal   Collection Time: 02/21/21  9:31 AM  Result Value Ref Range   POC Amphetamine UR None Detected NONE DETECTED (Cut Off Level 1000 ng/mL)   POC Secobarbital (BAR) None Detected NONE DETECTED (Cut Off Level 300 ng/mL)   POC Buprenorphine (BUP) None Detected NONE DETECTED (Cut Off Level 10 ng/mL)   POC Oxazepam (BZO) None Detected NONE DETECTED (Cut Off Level 300 ng/mL)   POC Cocaine UR Positive (A) NONE DETECTED  (Cut Off Level 300 ng/mL)   POC Methamphetamine UR None Detected NONE DETECTED (Cut Off Level 1000 ng/mL)   POC Morphine None Detected NONE DETECTED (Cut Off Level 300 ng/mL)   POC Oxycodone UR None Detected NONE DETECTED (Cut Off Level 100 ng/mL)   POC Methadone UR None Detected NONE DETECTED (Cut Off Level 300 ng/mL)   POC Marijuana UR None Detected NONE DETECTED (Cut Off Level 50 ng/mL)  CBC with Differential/Platelet     Status: Abnormal   Collection Time: 02/21/21  9:40 AM  Result Value Ref Range   WBC 7.2 4.0 - 10.5 K/uL   RBC 4.34 3.87 - 5.11 MIL/uL   Hemoglobin 14.3 12.0 - 15.0 g/dL   HCT 91.4 78.2 - 95.6 %   MCV 101.2 (H) 80.0 - 100.0 fL   MCH 32.9 26.0 - 34.0 pg   MCHC 32.6 30.0 - 36.0 g/dL   RDW 21.3 08.6 - 57.8 %   Platelets 216 150 - 400 K/uL   nRBC 0.0 0.0 - 0.2 %   Neutrophils Relative % 48 %   Neutro Abs 3.4 1.7 - 7.7 K/uL   Lymphocytes Relative 40 %   Lymphs Abs 2.9 0.7 - 4.0 K/uL   Monocytes Relative 10 %   Monocytes Absolute 0.7 0.1 - 1.0 K/uL  Eosinophils Relative 1 %   Eosinophils Absolute 0.1 0.0 - 0.5 K/uL   Basophils Relative 1 %   Basophils Absolute 0.1 0.0 - 0.1 K/uL   Immature Granulocytes 0 %   Abs Immature Granulocytes 0.02 0.00 - 0.07 K/uL    Comment: Performed at [facility], [address], [place], [place] [address]  Comprehensive metabolic panel     Status: None   Collection Time: 02/21/21  9:40 AM  Result Value Ref Range   Sodium 141 135 - 145 mmol/L   Potassium 3.7 3.5 - 5.1 mmol/L   Chloride 104 98 - 111 mmol/L   CO2 23 22 - 32 mmol/L   Glucose, Bld 81 70 - 99 mg/dL    Comment: Glucose reference range applies only to samples taken after fasting for at least 8 hours.   BUN 6 6 - 20 mg/dL   Creatinine, Ser 1.85 0.44 - 1.00 mg/dL   Calcium 9.5 8.9 - 63.1 mg/dL   Total Protein 7.5 6.5 - 8.1 g/dL   Albumin 4.4 3.5 - 5.0 g/dL   AST 34 15 - 41 U/L   ALT 34 0 - 44 U/L   Alkaline Phosphatase 100 38 - 126 U/L   Total Bilirubin 0.9  0.3 - 1.2 mg/dL   GFR, Estimated >49 >70 mL/min    Comment: (NOTE) Calculated using the CKD-EPI Creatinine Equation (2021)    Anion gap 14 5 - 15    Comment: Performed at [facility], [address], [place], [place] [address]  Ethanol     Status: Abnormal   Collection Time: 02/21/21  9:40 AM  Result Value Ref Range   Alcohol, Ethyl (B) 119 (H) <10 mg/dL    Comment: (NOTE) Lowest detectable limit for serum alcohol is 10 mg/dL.  For medical purposes only. Performed at [facility], [address] [place], [place] [address]   Lipid panel     Status: Abnormal   Collection Time: 02/21/21  9:40 AM  Result Value Ref Range   Cholesterol 214 (H) 0 - 200 mg/dL   Triglycerides 277 <412 mg/dL   HDL 58 >87 mg/dL   Total CHOL/HDL Ratio 3.7 RATIO   VLDL 27 0 - 40 mg/dL   LDL Cholesterol 867 (H) 0 - 99 mg/dL    Comment:        Total Cholesterol/HDL:CHD Risk Coronary Heart Disease Risk Table                     Men   Women  1/2 Average Risk   3.4   3.3  Average Risk       5.0   4.4  2 X Average Risk   9.6   7.1  3 X Average Risk  23.4   11.0        Use the calculated Patient Ratio above and the CHD Risk Table to determine the patient's CHD Risk.        ATP III CLASSIFICATION (LDL):  <100     mg/dL   Optimal  672-094  mg/dL   Near or Above                    Optimal  130-159  mg/dL   Borderline  709-628  mg/dL   High  >366     mg/dL   Very High Performed at [facility], [address], [place], [place] [address]   TSH  Status: None   Collection Time: 02/21/21  9:40 AM  Result Value Ref Range   TSH 1.868 0.350 - 4.500 uIU/mL    Comment: Performed by a 3rd Generation assay with a functional sensitivity of <=0.01 uIU/mL. Performed at [facility], [address], [place], [place] [address]   Pregnancy, urine POC     Status: None   Collection Time: 02/21/21  9:41 AM  Result Value Ref Range   Preg Test, Ur NEGATIVE NEGATIVE    Comment:        THE  SENSITIVITY OF THIS METHODOLOGY IS >24 mIU/mL   POC SARS Coronavirus 2 Ag     Status: None   Collection Time: 02/21/21  9:47 AM  Result Value Ref Range   SARSCOV2ONAVIRUS 2 AG NEGATIVE NEGATIVE    Comment: (NOTE) SARS-CoV-2 antigen NOT DETECTED.   Negative results are presumptive.  Negative results do not preclude SARS-CoV-2 infection and should not be used as the sole basis for treatment or other patient management decisions, including infection  control decisions, particularly in the presence of clinical signs and  symptoms consistent with COVID-19, or in those who have been in contact with the virus.  Negative results must be combined with clinical observations, patient history, and epidemiological information. The expected result is Negative.  Fact Sheet for Patients: https://www.jennings-kim.com/  Fact Sheet for Healthcare Providers: https://alexander-rogers.biz/  This test is not yet approved or cleared by the [place] FDA and  has been authorized for detection and/or diagnosis of SARS-CoV-2 by FDA under an Emergency Use Authorization (EUA).  This EUA will remain in effect (meaning this test can be used) for the duration of  the COV ID-19 declaration under Section 564(b)(1) of the Act, 21 U.S.C. section 360bbb-3(b)(1), unless the authorization is terminated or revoked sooner.      Blood Alcohol level:  Lab Results  Component Value Date   ETH 119 (H) 02/21/2021   ETH 47 (H) 01/25/2020    Metabolic Disorder Labs: Lab Results  Component Value Date   HGBA1C 4.3 (L) 08/26/2018   MPG 76.71 08/26/2018   No results found for: PROLACTIN Lab Results  Component Value Date   CHOL 214 (H) 02/21/2021   TRIG 135 02/21/2021   HDL 58 02/21/2021   CHOLHDL 3.7 02/21/2021   VLDL 27 02/21/2021   LDLCALC 129 (H) 02/21/2021   LDLCALC 121 (H) 09/30/2017    Physical Findings: AIMS:  , ,  ,  ,    CIWA:  CIWA-Ar Total: 4 COWS:      Musculoskeletal: Strength & Muscle Tone: decreased Gait & Station: shuffle Patient leans: N/A  Psychiatric Specialty Exam:  Presentation  General Appearance: Disheveled  Eye Contact:Fair  Speech:Normal Rate  Speech Volume:Decreased  Handedness:Right   Mood and Affect  Mood:Anxious; Depressed  Affect:Congruent   Thought Process  Thought Processes:Goal Directed  Descriptions of Associations:Circumstantial  Orientation:Full (Time, Place and Person)  Thought Content:Rumination  History of Schizophrenia/Schizoaffective disorder:No  Duration of Psychotic Symptoms:No data recorded Hallucinations:Hallucinations: Auditory; Visual Description of Auditory Hallucinations: Voices saying "It's ok to kill yourself." Description of Visual Hallucinations: Elephants in the closet  Ideas of Reference:Delusions  Suicidal Thoughts:Suicidal Thoughts: Yes, Active SI Active Intent and/or Plan: With Means to Carry Out  Homicidal Thoughts:Homicidal Thoughts: No   Sensorium  Memory:Immediate Poor; Recent Poor; Remote Poor  Judgment:Impaired  Insight:Lacking   Executive Functions  Concentration:Poor  Attention Span:Poor  Recall:Poor  Fund of Knowledge:Poor  Language:Poor   Psychomotor Activity  Psychomotor Activity:Psychomotor  Activity: Increased   Assets  Assets:Desire for Improvement; Resilience   Sleep  Sleep:Sleep: Poor Number of Hours of Sleep: -1    Physical Exam: Physical Exam Vitals and nursing note reviewed.  Constitutional:      Appearance: She is obese.  HENT:     Head: Normocephalic and atraumatic.  Pulmonary:     Effort: Pulmonary effort is normal.  Neurological:     General: No focal deficit present.     Mental Status: She is alert and oriented to person, place, and time.   Review of Systems  Cardiovascular:  Positive for chest pain.  Musculoskeletal:  Positive for back pain, joint pain, myalgias and neck pain.  All other  systems reviewed and are negative. Blood pressure (!) 107/55, pulse (!) 57, temperature 98.1 F (36.7 C), temperature source Oral, resp. rate 16, height 5\' 7"  (1.702 m), weight 87.1 kg, SpO2 99 %. Body mass index is 30.07 kg/m.   Treatment Plan Summary: Daily contact with patient to assess and evaluate symptoms and progress in treatment, Medication management, and Plan patient is seen and examined.  Patient is a 55 year old female with the above-stated past psychiatric history who is seen in follow-up.  Diagnosis: 1.  Reported history of bipolar disorder. 2.  Cocaine dependence. 3.  Cocaine withdrawal. 4.  Alcohol dependence. 5.  Alcohol withdrawal, uncomplicated 6.  Posttraumatic stress disorder. 7.  Reported generalized anxiety disorder. 8.  Hypertension. 9.  Chronic low back pain 10.  Peripheral neuropathy. 11.  Prolonged QTc interval. 12.  COPD/asthma 13.  History of PEA cardiac arrest secondary to respiratory failure from cocaine abuse. 14.  Echocardiogram from 08/27/2018 showed an ejection fraction between 65 and 70%. 15.  Cardiac catheterization on 08/30/2018 showed a 30% proximal RCA lesion, normal left ventricular ejection fraction.  Pertinent findings on examination today: 1.  Patient is essentially unchanged with regard to her psychiatric evaluation from 02/21/2021. 2.  Patient has limited insight with regard to what we can provide her with regard to housing. 3.  We discussed the fact that she would not be able to get into a rehabilitation facility if she continued on alprazolam as well as hydrocodone. 4.  Patient agrees with detoxification from these substances as well.  Plan: 1.  Continue albuterol as needed for wheezing or shortness of breath. 2.  Reduce amlodipine to 5 mg p.o. daily for hypertension and chest pain. 3.  Continue Lipitor 40 mg p.o. daily for hyperlipidemia. 4.  Continue Wellbutrin short acting 75 mg p.o. daily for depression. 5.  Continue clonidine 0.1 mg  p.o. 3 times daily as needed systolic blood pressure greater than 160. 6.  Get an EKG this AM. 7.  Increase Imdur to 75 mg p.o. daily for chest pain. 8.  Add opiate detox protocol. 9.  Decrease Lopressor to 25 mg p.o. twice daily for heart rate and hypertension. 10.  Continue Risperdal agitation protocol as needed. 11.  Continue trazodone 300 mg p.o. nightly for insomnia. 12.  Disposition planning-in progress.  04/24/2021, MD 02/22/2021, 10:51 AM

## 2021-02-22 NOTE — Progress Notes (Addendum)
Pt scheduled dose of  50 mg Metoprolol and 300 mg Trazodone held.  BP parameters were not met (BP 92/57, HR 64) at 2021.  Provider made aware.  Pt hydrated with several cups of fluid and snack.  BP recheck at 2207 (105/68, HR 65).  Provider made aware.  Pt unable to fall asleep.  Provider made aware.  BP recheck at 2354 (119/66, HR 63).  Administered PRN meds:  650 mg Tylenol for leg/back pain,  1 mg Ativan for CIWA >10, and 5 mg Melatonin for sleep.  Administered one-time dose 150 mg Trazodone for sleep.  Provider aware. 

## 2021-02-22 NOTE — [facility] Group Notes (Signed)
  [facility] LCSW Group Therapy Note  Date/Time:  02/22/2021 10:00AM-11:00AM  Type of Therapy and Topic:  Group Therapy:  Self-Care after Hospitalization  Participation Level:  Did Not Attend   Description of Group This process group involved patients discussing how they plan to take care of themselves in a better manner when they get home from the hospital.  The group started with patients listing one healthy and one unhealthy way they took care of themselves prior to hospitalization.  A discussion ensued about the differences in healthy and unhealthy coping skills.  Group members shared ideas about making changes when they return home so that they can stay well and in recovery.  The white board was used to list ideas so that patients can continue to see these ideas throughout the day.  Therapeutic Goals Patient will identify and describe one healthy and one unhealthy coping technique used prior to hospitalization Patient will participate in generating ideas about healthy self-care options when they return to the community Patients will be supportive of one another and receive said support from others Patient will identify one healthy self-care activity to add to his/her post-hospitalization life that can help in recovery  Summary of Patient Progress:  The patient was invited to group, did not attend.   Therapeutic Modalities Brief Solution-Focused Therapy Motivational Interviewing Psychoeducation   Ila Landowski Grossman-Orr, LCSW 02/22/2021, 12:00pm    

## 2021-02-22 NOTE — Progress Notes (Signed)
  D:  Pt presents with high anxiety and depression.  Pt complains of weakness all over stating, "she feels funny."  Pt complains that she has not received her allergy nose spray, hydrocodone, vistaril, sinus medication, and something for her alcohol craving yet.  Provider made aware.  Pt reports SI, but verbally contracts for safety.  Pt denies HI.  Pt reports AVH that comes and goes.  A:  Labs/Vital monitored; Medication provided; Pt encouraged to communicate concerns.  R:  Pt remains safe on unit with Q 15 minute safety checks.     02/22/21 0011  Psych Admission Type (Psych Patients Only)  Admission Status Voluntary  Psychosocial Assessment  Patient Complaints Anxiety;Depression  Eye Contact Fair  Facial Expression Sad  Affect Sad;Depressed  Speech Unremarkable  Interaction Cautious;Guarded  Motor Activity Slow  Appearance/Hygiene Unremarkable  Behavior Characteristics Cooperative;Anxious  Mood Depressed;Anxious;Sad  Thought Process  Coherency WDL  Content Blaming self  Delusions WDL  Perception Hallucinations  Hallucination Auditory;Visual  Judgment Poor  Confusion WDL  Danger to Self  Current suicidal ideation? Active  Self-Injurious Behavior No self-injurious ideation or behavior indicators observed or expressed   Agreement Not to Harm Self Yes  Description of Agreement verbal agreement to contract for safety  Danger to Others  Danger to Others None reported or observed

## 2021-02-22 NOTE — Plan of Care (Signed)
  Problem: Education: Goal: Mental status will improve Outcome: Not Progressing   Problem: Coping: Goal: Coping ability will improve Outcome: Not Progressing   Problem: Self-Concept: Goal: Level of anxiety will decrease Outcome: Not Progressing

## 2021-02-22 NOTE — Discharge Instructions (Signed)
[facility] ([facility]) is part of a community-based service that includes treatment, transportation, and housing.  [facility] believes you can make positive choices with the support of family members, caregivers, and your community. Recovery can begin when you admit you have a problem and are willing to make changes.  Recovery begins with an initial meeting to talk about your physical, mental, and social needs. After that, you will talk to a counselor and develop and a treatment plan that's specific to you.  [facility] utilizes certified peer support specialists to work with you by offering support from those with lived experience in recovery. The program provides transportation to your appointments at [facility] and to other medical appointments. You may also receive temporary housing, food, and recovery support services. A typical [facility] participant is enrolled for 90 days, and then we help you make aftercare plans for future treatment options.  [facility] gives priority to you if you are returning to the community from a state hospital, detox program, or another institution, including prison.

## 2021-02-22 NOTE — [facility] Suicide Risk Assessment (Addendum)
[facility] INPATIENT:  Family/Significant Other Suicide Prevention Education  Suicide Prevention Education:  Contact Attempts: Camaryn Lumbert (pt's sister) [phone] has been identified by the patient as the family member/significant other with whom the patient will be residing, and identified as the person(s) who will aid the patient in the event of a mental health crisis.  With written consent from the patient, two attempts were made to provide suicide prevention education, prior to and/or following the patient's discharge.  We were unsuccessful in providing suicide prevention education.  A suicide education pamphlet was given to the patient to share with family/significant other.  Date and time of first attempt: Saturday, 02/22/2021 at 11:50AM (kept ringing/no voicemail) CSW continuing to assess.  Date and time of second attempt: 02/24/21.   Rona Ravens 02/22/2021, 11:54 AM

## 2021-02-22 NOTE — Progress Notes (Signed)
EKG results placed on the outside of pt's shadow chart  Normal sinus rhythm Prolonged QT   QT/QTc  474/500 ms

## 2021-02-22 NOTE — Progress Notes (Signed)
Pt did not attend goals group today. 

## 2021-02-23 MED ORDER — METHOCARBAMOL 750 MG PO TABS
750.0000 mg | ORAL_TABLET | Freq: Three times a day (TID) | ORAL | Status: DC | PRN
  Administered 2021-02-23 – 2021-03-02 (×6): 750 mg via ORAL
  Filled 2021-02-23 (×6): qty 1

## 2021-02-23 MED ORDER — FLUTICASONE PROPIONATE 50 MCG/ACT NA SUSP
1.0000 | Freq: Every day | NASAL | Status: DC
  Administered 2021-02-23 – 2021-03-04 (×10): 1 via NASAL
  Filled 2021-02-23 (×2): qty 16

## 2021-02-23 MED ORDER — MELOXICAM 7.5 MG PO TABS
7.5000 mg | ORAL_TABLET | Freq: Every day | ORAL | Status: DC
  Administered 2021-02-23 – 2021-02-24 (×2): 7.5 mg via ORAL
  Filled 2021-02-23 (×4): qty 1

## 2021-02-23 MED ORDER — DOXEPIN HCL 50 MG PO CAPS
50.0000 mg | ORAL_CAPSULE | Freq: Every day | ORAL | Status: DC
  Administered 2021-02-23: 50 mg via ORAL
  Filled 2021-02-23 (×2): qty 1

## 2021-02-23 NOTE — Progress Notes (Signed)
D:  Patient denied SI and HI while talking to nurse this morning.  Stated if she had suicidal thoughts, she would talk to staff.  Denied A/V hallucinations. A:  Medications administered per MD orders.  Emotional support and encouragement given. R:  Safety maintained with 15 minute checks.

## 2021-02-23 NOTE — Plan of Care (Signed)
Nurse discussed anxiety, depression and coping skills with patient.  

## 2021-02-23 NOTE — Progress Notes (Signed)
DAR NOTE: Patient presents with anxious affect and depressed mood.  Denies auditory and visual hallucinations.  Pt has been complaining of back pain but refused to take tramadol stated make her her itch.  Maintained on routine safety checks.  Medications given as prescribed.  Support and encouragement offered as needed.  Attended group and participated. Will continue to monitor.

## 2021-02-23 NOTE — Progress Notes (Signed)
[facility], [place] MD Progress Note  02/23/2021 11:27 AM Amanda [name]  MRN:  [id number] Subjective:  Patient is a 55 year old female with a reported past psychiatric history significant for bipolar disorder, cocaine use disorder, cocaine dependence, alcohol dependence, alcohol use disorder, benzodiazepine use disorder, opiate use disorder who was admitted on 02/21/2021 with suicidal ideation.  Objective: Patient is seen and examined.  Patient is a 55 year old female with the above-stated past psychiatric history who is seen in follow-up.  She actually looks a little bit better today.  She took a shower yesterday, and is try to get out of bed a little bit more frequently.  Her withdrawal symptoms seem to have decreased.  She continues to complain of back pain.  I ordered Robaxin as well as tramadol yesterday, but she stated she is unable to take tramadol.  We discussed her continued problems with substances.  Her plan now is to go to [place], [place] and stay with a family member.  She stated that she is unable to return to the section 8 housing that she has.  Initially while she was there she would use substances by herself, but no one else was involved.  She stated that recently she has had drug users come into the home, and that made things far worse.  She stated she had previously taken meloxicam for back pain issues.  I told her I would review her kidney function to make sure that that would be safe.  She does have an allergy to aspirin.  Her suicidal ideation is fleeting, but she contracts for safety.  Her vital signs are stable, she is afebrile.  Pulse oximetry on room air is 99%.  Her sleep is still a problem at 4 hours, but she has spent a great deal of her time in bed.  She had an EKG done yesterday which showed a reduction in her QTC prolongation.  Previously had been 489, and yesterday sats dropped slightly to 474.  Principal Problem: <principal problem not specified> Diagnosis: Active Problems:   Bipolar  disorder with current episode depressed (HCC)  Total Time spent with patient: 20 minutes  Past Psychiatric History: See admission H&P  Past Medical History:  Past Medical History:  Diagnosis Date   Anxiety    Arthritis    lower back   Asthma    Bipolar disorder (HCC)    Carpal tunnel syndrome, bilateral    Chronic back pain    Depression    Diverticulosis    Fx. left wrist    GERD (gastroesophageal reflux disease)    Heart murmur    "born with"   Heart murmur    HLD (hyperlipidemia)    Hypertension    IBS (irritable bowel syndrome)    Internal hemorrhoids    Muscle spasms of neck    Neuropathy    PUD (peptic ulcer disease)     Past Surgical History:  Procedure Laterality Date   ARTHRODESIS METATARSALPHALANGEAL JOINT (MTPJ) Right 04/29/2016   Procedure: RIGHT GREAT TOE METATARSOPHALANGEAL JOINT (MTPJ) FUSION, HARDWARE REMOVAL;  Surgeon: Tarry Kos, MD;  Location: [facility];  Service: Orthopedics;  Laterality: Right;  RIGHT GREAT TOE METATARSOPHALANGEAL JOINT (MTPJ) FUSION, HARDWARE REMOVAL   CARPAL TUNNEL RELEASE Right 2003   CHOLECYSTECTOMY N/A 02/09/2014   Procedure: LAPAROSCOPIC CHOLECYSTECTOMY WITH INTRAOPERATIVE CHOLANGIOGRAM;  Surgeon: Valarie Merino, MD;  Location: [facility];  Service: General;  Laterality: N/A;   CHOLECYSTECTOMY     FOOT SURGERY     HAMMER TOE  FUSION Bilateral 2008   HARDWARE REMOVAL Right 04/29/2016   Procedure: HARDWARE REMOVAL;  Surgeon: Tarry Kos, MD;  Location: [facility];  Service: Orthopedics;  Laterality: Right;  HARDWARE REMOVAL   LEFT HEART CATH AND CORONARY ANGIOGRAPHY N/A 08/30/2018   Procedure: LEFT HEART CATH AND CORONARY ANGIOGRAPHY;  Surgeon: Marykay Lex, MD;  Location: [facility];  Service: Cardiovascular;  Laterality: N/A;   ROTATOR CUFF REPAIR Right 2011   WRIST FRACTURE SURGERY Left    Family History:  Family History  Problem Relation Age of Onset   Pancreatic cancer Mother    CAD  Father    Diabetes Mellitus II Father    Heart failure Father    Diabetes Father    Breast cancer Paternal Grandmother    HIV/AIDS Brother    Other Sister        Brain tumor   Colon cancer Neg Hx    Family Psychiatric  History: See admission H&P Social History:  Social History   Substance and Sexual Activity  Alcohol Use Yes   Comment: heavily daily     Social History   Substance and Sexual Activity  Drug Use Yes   Types: Cocaine, "Crack" cocaine, Marijuana   Comment: last used two years ago    Social History   Socioeconomic History   Marital status: Single    Spouse name: Not on file   Number of children: 0   Years of education: 12   Highest education level: Not on file  Occupational History   Occupation: Cook    Comment: [employer]  Tobacco Use   Smoking status: Light Smoker    Pack years: 0.00    Types: Cigarettes   Smokeless tobacco: Never   Tobacco comments:    "takes a puff sometimes"  Substance and Sexual Activity   Alcohol use: Yes    Comment: heavily daily   Drug use: Yes    Types: Cocaine, "Crack" cocaine, Marijuana    Comment: last used two years ago   Sexual activity: Yes    Partners: Female  Other Topics Concern   Not on file  Social History Narrative   ** Merged History Encounter **       Pt lives at home alone. She has a HS education level and does not have any children.  She drinks 2 cups of caffeine daily.   Social Determinants of Health   Financial Resource Strain: Not on file  Food Insecurity: Not on file  Transportation Needs: Not on file  Physical Activity: Not on file  Stress: Not on file  Social Connections: Not on file   Additional Social History:                         Sleep: Fair  Appetite:  Good  Current Medications: Current Facility-Administered Medications  Medication Dose Route Frequency Provider Last Rate Last Admin   acetaminophen (TYLENOL) tablet 650 mg  650 mg Oral Q6H PRN Antonieta Pert, MD    650 mg at 02/23/21 0257   albuterol (VENTOLIN HFA) 108 (90 Base) MCG/ACT inhaler 2 puff  2 puff Inhalation Q4H PRN Antonieta Pert, MD       alum & mag hydroxide-simeth (MAALOX/MYLANTA) 200-200-20 MG/5ML suspension 30 mL  30 mL Oral Q4H PRN Antonieta Pert, MD   30 mL at 02/22/21 2103   amLODipine (NORVASC) tablet 5 mg  5 mg Oral Daily Antonieta Pert, MD  5 mg at 02/23/21 0857   atorvastatin (LIPITOR) tablet 40 mg  40 mg Oral Daily Antonieta Pertlary, Samar Dass Lawson, MD   40 mg at 02/23/21 0857   buPROPion [facility](WELLBUTRIN) tablet 75 mg  75 mg Oral Daily Antonieta Pertlary, Cherrise Occhipinti Lawson, MD   75 mg at 02/23/21 [id number]   carbamazepine (TEGRETOL) chewable tablet 200 mg  200 mg Oral BID Antonieta Pertlary, Jonne Rote Lawson, MD   200 mg at 02/23/21 [id number]   cloNIDine (CATAPRES) tablet 0.1 mg  0.1 mg Oral TID PRN Antonieta Pertlary, Jaryn Hocutt Lawson, MD       feeding supplement (ENSURE ENLIVE / ENSURE PLUS) liquid 237 mL  237 mL Oral BID BM Antonieta Pertlary, Briauna Gilmartin Lawson, MD   237 mL at 02/22/21 0939   isosorbide mononitrate (IMDUR) 24 hr tablet 75 mg  75 mg Oral Daily Antonieta Pertlary, Zaidin Blyden Lawson, MD   75 mg at 02/23/21 0856   LORazepam (ATIVAN) tablet 1 mg  1 mg Oral Q6H PRN Antonieta Pertlary, Prentis Langdon Lawson, MD   1 mg at 02/22/21 0011   magnesium hydroxide (MILK OF MAGNESIA) suspension 30 mL  30 mL Oral Daily PRN Antonieta Pertlary, Amaru Burroughs Lawson, MD   30 mL at 02/22/21 1848   melatonin tablet 5 mg  5 mg Oral QHS PRN Bobbitt, Shalon E, NP   5 mg at 02/22/21 2100   meloxicam (MOBIC) tablet 7.5 mg  7.5 mg Oral Daily Antonieta Pertlary, Zyier Dykema Lawson, MD       methocarbamol (ROBAXIN) tablet 750 mg  750 mg Oral Q8H PRN Antonieta Pertlary, Quentavious Rittenhouse Lawson, MD       metoprolol tartrate (LOPRESSOR) tablet 25 mg  25 mg Oral BID Antonieta Pertlary, Tighe Gitto Lawson, MD   25 mg at 02/23/21 0900   pantoprazole (PROTONIX) EC tablet 40 mg  40 mg Oral q morning Antonieta Pertlary, Joslin Doell Lawson, MD   40 mg at 02/23/21 [id number]   risperiDONE (RISPERDAL M-TABS) disintegrating tablet 2 mg  2 mg Oral Q8H PRN Antonieta Pertlary, Keylin Podolsky Lawson, MD       And   ziprasidone (GEODON) injection 20 mg  20 mg Intramuscular  Q12H PRN Antonieta Pertlary, Jorgen Wolfinger Lawson, MD       traZODone (DESYREL) tablet 300 mg  300 mg Oral QHS Antonieta Pertlary, Justinn Welter Lawson, MD   300 mg at 02/22/21 2100   Vitamin D (Ergocalciferol) (DRISDOL) capsule 50,000 Units  50,000 Units Oral Q7 days Antonieta Pertlary, Darcell Yacoub Lawson, MD   50,000 Units at 02/22/21 1256    Lab Results: No results found for this or any previous visit (from the past 48 hour(s)).  Blood Alcohol level:  Lab Results  Component Value Date   ETH 119 (H) 02/21/2021   ETH 47 (H) 01/25/2020    Metabolic Disorder Labs: Lab Results  Component Value Date   HGBA1C 4.3 (L) 08/26/2018   MPG 76.71 08/26/2018   No results found for: PROLACTIN Lab Results  Component Value Date   CHOL 214 (H) 02/21/2021   TRIG 135 02/21/2021   HDL 58 02/21/2021   CHOLHDL 3.7 02/21/2021   VLDL 27 02/21/2021   LDLCALC 129 (H) 02/21/2021   LDLCALC 121 (H) 09/30/2017    Physical Findings: AIMS:  , ,  ,  ,    CIWA:  CIWA-Ar Total: 3 COWS:     Musculoskeletal: Strength & Muscle Tone: decreased Gait & Station: shuffle Patient leans: N/A  Psychiatric Specialty Exam:  Presentation  General Appearance: Disheveled  Eye Contact:Fair  Speech:Normal Rate  Speech Volume:Normal  Handedness:Right   Mood and Affect  Mood:Dysphoric; Depressed; Anxious  Affect:Congruent  Thought Process  Thought Processes:Goal Directed  Descriptions of Associations:Circumstantial  Orientation:Full (Time, Place and Person)  Thought Content:Rumination  History of Schizophrenia/Schizoaffective disorder:No  Duration of Psychotic Symptoms:No data recorded Hallucinations:Hallucinations: Auditory  Ideas of Reference:Delusions  Suicidal Thoughts:Suicidal Thoughts: Yes, Passive SI Active Intent and/or Plan: Without Intent SI Passive Intent and/or Plan: Without Intent  Homicidal Thoughts:Homicidal Thoughts: No   Sensorium  Memory:Immediate Poor; Recent Poor; Remote  Poor  Judgment:Impaired  [facility]:Lacking   Executive Functions  Concentration:Fair  Attention Span:Fair  Recall:Fair  Fund of Knowledge:Fair  Language:Fair   Psychomotor Activity  Psychomotor Activity:Psychomotor Activity: Increased   Assets  Assets:Desire for Improvement; Resilience   Sleep  Sleep:Sleep: Fair    Physical Exam: Physical Exam Vitals and nursing note reviewed.  Constitutional:      Appearance: Normal appearance.  HENT:     Head: Normocephalic and atraumatic.  Pulmonary:     Effort: Pulmonary effort is normal.  Neurological:     General: No focal deficit present.     Mental Status: She is alert and oriented to person, place, and time.   Review of Systems  Constitutional:  Positive for malaise/fatigue.  Musculoskeletal:  Positive for back pain, joint pain and myalgias.  Neurological:  Positive for tingling and weakness.  All other systems reviewed and are negative. Blood pressure 125/75, pulse 60, temperature 98.4 F (36.9 C), temperature source Oral, resp. rate 16, height 5\' 7"  (1.702 m), weight 87.1 kg, SpO2 99 %. Body mass index is 30.07 kg/m.   Treatment Plan Summary: Daily contact with patient to assess and evaluate symptoms and progress in treatment, Medication management, and Plan patient is seen and examined.  Patient is a 55 year old female with the above-stated past psychiatric history who is seen in follow-up.  Diagnosis: 1.  Reported history of bipolar disorder. 2.  Cocaine dependence. 3.  Cocaine withdrawal. 4.  Alcohol dependence. 5.  Alcohol withdrawal, uncomplicated 6.  Posttraumatic stress disorder. 7.  Reported generalized anxiety disorder. 8.  Hypertension. 9.  Chronic low back pain 10.  Peripheral neuropathy. 11.  Prolonged QTc interval. 12.  COPD/asthma 13.  History of PEA cardiac arrest secondary to respiratory failure from cocaine abuse. 14.  Echocardiogram from 08/27/2018 showed an ejection fraction between 65  and 70%. 15.  Cardiac catheterization on 08/30/2018 showed a 30% proximal RCA lesion, normal left ventricular ejection fraction.  Pertinent findings on examination today: 1.  Mood and hygiene have improved. 2.  Still fleeting suicidal ideation, but contracts for safety and still very future forward thinking. 3.  Discussed the possibility of adding meloxicam for her pain. 4.  Patient is now considering going to 10/29/2018 to stay with family after discharge. 5.  QTc interval has dropped slightly to 474.  Plan: 1.  Continue albuterol as needed for wheezing and shortness of breath. 2.  Continue amlodipine 5 mg p.o. daily for hypertension and chest pain. 3.  Continue Lipitor 40 mg p.o. daily for hyperlipidemia. 4.  Continue Wellbutrin short acting 75 mg p.o. daily for depression. 5.  Continue clonidine 0.1 mg p.o. 3 times daily as needed systolic blood pressure greater than 160. 6.  Continue Imdur 75 mg p.o. daily for chest pain. 7.  Increase Robaxin to 750 mg p.o. 3 times daily as needed pain. 8.  Continue Lopressor 25 mg p.o. twice daily for heart rate and hypertension. 9.  Continue Risperdal agitation protocol as needed. 10.  Stop trazodone. 11.  Doxepin 50 mg p.o. nightly for insomnia and titrate. 12.  Continue Protonix 40 mg p.o. daily for gastric protection. 13.  Add Mobic 7.5 mg p.o. daily for osteoarthritic pain. 14.  Add vitamin D Drisdol 50,000 units p.o. q. 7 days first dose today for nutritional supplementation. 15.  Disposition planning-in progress.  Antonieta Pert, MD 02/23/2021, 11:27 AM

## 2021-02-23 NOTE — Progress Notes (Signed)
   02/23/21 2253  Psych Admission Type (Psych Patients Only)  Admission Status Voluntary  Psychosocial Assessment  Patient Complaints Anxiety  Eye Contact Fair  Facial Expression Flat  Affect Appropriate to circumstance  Speech Logical/coherent  Interaction Guarded  Motor Activity Slow  Appearance/Hygiene Unremarkable  Behavior Characteristics Appropriate to situation  Mood Depressed  Thought Process  Coherency WDL  Content Blaming self  Delusions WDL  Perception Hallucinations  Hallucination None reported or observed  Judgment Poor  Confusion None  Danger to Self  Self-Injurious Behavior No self-injurious ideation or behavior indicators observed or expressed   Agreement Not to Harm Self Yes  Description of Agreement verbal agreement to contract for safety  Danger to Others  Danger to Others None reported or observed

## 2021-02-23 NOTE — Progress Notes (Signed)
Patient requested pain medication for back pain arthritis.  Stated she could not take tramadol, makes her shake. Amanda [name]

## 2021-02-23 NOTE — [facility] Group Notes (Signed)
[facility] Group Notes: (Clinical Social Work)   02/23/2021      Type of Therapy:  Group Therapy   Participation Level:  Did Not Attend - was invited both individually by [facility] and by overhead announcement, chose not to attend.  She arrived in the group room at 10:57am.   Ambrose Mantle, LCSW 02/23/2021, 1:44 PM

## 2021-02-23 NOTE — Progress Notes (Signed)
[employer] MD Progress Note  02/23/2021 11:13 AM Amanda [name]  MRN:  [id number] Subjective: "  I am in pain, my whole body is in pain.  I am depressed , I am hooked on Alcohol and Cocaine.  I want to get off these things but it is hard"   Reason for Admission:  Patient is a 55 year old female with a reported past psychiatric history significant for bipolar disorder, cocaine use disorder, cocaine dependence, alcohol dependence, alcohol use disorder, benzodiazepine use disorder, opiate use disorder who was admitted on 02/21/2021 with suicidal ideation-had placed a butcher knife to her chest to stab herself but ended up calling the Police who brought her to the [facility]  Objective: Patient is seen and examined in her room this morning patient reported she could not get up walk with provider to the office for daily evaluation.  She reported generalized body pain  but refused her Tramadol stating it makes her body shakes.  She was given Tylenol she stated.  Patient reported that she came to the hospital because suicide ideation related to substance addiction.  She reported that she is addicted to Alcohol and Cocaine.  She has been sober a while ago but relapsed.  She does not feel safe going back to the environment she was staying before coming to the hospital.  She believes going back to that same environment will cause her an immediate relapse.  She rated depression 8/10 with 10 being severe depression.  Her stressors are related to her addiction.  She want to get treated after hospitalization at a rehab center but spend her days in bed and not engaging in discharge plan with SW/Discharge planner.  Provider encouraged patient to engage in activities in the unit and to speak with SW regarding appropriate discharge plan.  She reported suicide ideation with no plan.  Patient also stated she will let staff know of her suicide ideation and get help immediately.  Mental health was informed and  advised to pay close attention to patient. Principal Problem: <principal problem not specified> Diagnosis: Active Problems:   Bipolar disorder with current episode depressed (HCC)  Total Time spent with patient: 20 minutes  Past Psychiatric History: See admission H&P  Past Medical History:  Past Medical History:  Diagnosis Date  . Anxiety   . Arthritis    lower back  . Asthma   . Bipolar disorder (HCC)   . Carpal tunnel syndrome, bilateral   . Chronic back pain   . Depression   . Diverticulosis   . Fx. left wrist   . GERD (gastroesophageal reflux disease)   . Heart murmur    "born with"  . Heart murmur   . HLD (hyperlipidemia)   . Hypertension   . IBS (irritable bowel syndrome)   . Internal hemorrhoids   . Muscle spasms of neck   . Neuropathy   . PUD (peptic ulcer disease)     Past Surgical History:  Procedure Laterality Date  . ARTHRODESIS METATARSALPHALANGEAL JOINT (MTPJ) Right 04/29/2016   Procedure: RIGHT GREAT TOE METATARSOPHALANGEAL JOINT (MTPJ) FUSION, HARDWARE REMOVAL;  Surgeon: Tarry Kos, MD;  Location: [facility];  Service: Orthopedics;  Laterality: Right;  RIGHT GREAT TOE METATARSOPHALANGEAL JOINT (MTPJ) FUSION, HARDWARE REMOVAL  . CARPAL TUNNEL RELEASE Right 2003  . CHOLECYSTECTOMY N/A 02/09/2014   Procedure: LAPAROSCOPIC CHOLECYSTECTOMY WITH INTRAOPERATIVE CHOLANGIOGRAM;  Surgeon: Valarie Merino, MD;  Location: [facility];  Service: General;  Laterality: N/A;  . CHOLECYSTECTOMY    .  FOOT SURGERY    . HAMMER TOE FUSION Bilateral 2008  . HARDWARE REMOVAL Right 04/29/2016   Procedure: HARDWARE REMOVAL;  Surgeon: Tarry Kos, MD;  Location: [facility];  Service: Orthopedics;  Laterality: Right;  HARDWARE REMOVAL  . LEFT HEART CATH AND CORONARY ANGIOGRAPHY N/A 08/30/2018   Procedure: LEFT HEART CATH AND CORONARY ANGIOGRAPHY;  Surgeon: Marykay Lex, MD;  Location: [facility] INVASIVE CV LAB;  Service: Cardiovascular;  Laterality: N/A;  .  ROTATOR CUFF REPAIR Right 2011  . WRIST FRACTURE SURGERY Left    Family History:  Family History  Problem Relation Age of Onset  . Pancreatic cancer Mother   . CAD Father   . Diabetes Mellitus II Father   . Heart failure Father   . Diabetes Father   . Breast cancer Paternal Grandmother   . HIV/AIDS Brother   . Other Sister        Brain tumor  . Colon cancer Neg Hx    Family Psychiatric  History: See admission H&P Social History:  Social History   Substance and Sexual Activity  Alcohol Use Yes   Comment: heavily daily     Social History   Substance and Sexual Activity  Drug Use Yes  . Types: Cocaine, "Crack" cocaine, Marijuana   Comment: last used two years ago    Social History   Socioeconomic History  . Marital status: Single    Spouse name: Not on file  . Number of children: 0  . Years of education: 44  . Highest education level: Not on file  Occupational History  . Occupation: Cook    Comment: [employer]  Tobacco Use  . Smoking status: Light Smoker    Pack years: 0.00    Types: Cigarettes  . Smokeless tobacco: Never  . Tobacco comments:    "takes a puff sometimes"  Substance and Sexual Activity  . Alcohol use: Yes    Comment: heavily daily  . Drug use: Yes    Types: Cocaine, "Crack" cocaine, Marijuana    Comment: last used two years ago  . Sexual activity: Yes    Partners: Female  Other Topics Concern  . Not on file  Social History Narrative   ** Merged History Encounter **       Pt lives at home alone. She has a HS education level and does not have any children.  She drinks 2 cups of caffeine daily.   Social Determinants of Health   Financial Resource Strain: Not on file  Food Insecurity: Not on file  Transportation Needs: Not on file  Physical Activity: Not on file  Stress: Not on file  Social Connections: Not on file   Additional Social History:                         Sleep: Fair  Appetite:  Fair  Current  Medications: Current Facility-Administered Medications  Medication Dose Route Frequency Provider Last Rate Last Admin  . acetaminophen (TYLENOL) tablet 650 mg  650 mg Oral Q6H PRN Antonieta Pert, MD   650 mg at 02/23/21 0257  . albuterol (VENTOLIN HFA) 108 (90 Base) MCG/ACT inhaler 2 puff  2 puff Inhalation Q4H PRN Antonieta Pert, MD      . alum & mag hydroxide-simeth (MAALOX/MYLANTA) 200-200-20 MG/5ML suspension 30 mL  30 mL Oral Q4H PRN Antonieta Pert, MD   30 mL at 02/22/21 2103  . amLODipine (NORVASC) tablet 5 mg  5 mg Oral Daily Antonieta Pert, MD   5 mg at 02/23/21 0857  . atorvastatin (LIPITOR) tablet 40 mg  40 mg Oral Daily Antonieta Pert, MD   40 mg at 02/23/21 0857  . buPROPion [facility]) tablet 75 mg  75 mg Oral Daily Antonieta Pert, MD   75 mg at 02/23/21 0858  . carbamazepine (TEGRETOL) chewable tablet 200 mg  200 mg Oral BID Antonieta Pert, MD   200 mg at 02/23/21 0857  . cloNIDine (CATAPRES) tablet 0.1 mg  0.1 mg Oral TID PRN Antonieta Pert, MD      . feeding supplement (ENSURE ENLIVE / ENSURE PLUS) liquid 237 mL  237 mL Oral BID BM Antonieta Pert, MD   237 mL at 02/22/21 0939  . isosorbide mononitrate (IMDUR) 24 hr tablet 75 mg  75 mg Oral Daily Antonieta Pert, MD   75 mg at 02/23/21 0856  . LORazepam (ATIVAN) tablet 1 mg  1 mg Oral Q6H PRN Antonieta Pert, MD   1 mg at 02/22/21 0011  . magnesium hydroxide (MILK OF MAGNESIA) suspension 30 mL  30 mL Oral Daily PRN Antonieta Pert, MD   30 mL at 02/22/21 1848  . melatonin tablet 5 mg  5 mg Oral QHS PRN Bobbitt, Shalon E, NP   5 mg at 02/22/21 2100  . methocarbamol (ROBAXIN) tablet 500 mg  500 mg Oral Q8H PRN Antonieta Pert, MD   500 mg at 02/23/21 0258  . metoprolol tartrate (LOPRESSOR) tablet 25 mg  25 mg Oral BID Antonieta Pert, MD   25 mg at 02/23/21 0900  . pantoprazole (PROTONIX) EC tablet 40 mg  40 mg Oral q morning Antonieta Pert, MD   40 mg at 02/23/21 0903  .  risperiDONE (RISPERDAL M-TABS) disintegrating tablet 2 mg  2 mg Oral Q8H PRN Antonieta Pert, MD       And  . ziprasidone (GEODON) injection 20 mg  20 mg Intramuscular Q12H PRN Antonieta Pert, MD      . traMADol Janean Sark) tablet 50 mg  50 mg Oral Q8H PRN Antonieta Pert, MD      . traZODone (DESYREL) tablet 300 mg  300 mg Oral QHS Antonieta Pert, MD   300 mg at 02/22/21 2100  . Vitamin D (Ergocalciferol) (DRISDOL) capsule 50,000 Units  50,000 Units Oral Q7 days Antonieta Pert, MD   50,000 Units at 02/22/21 1256    Lab Results:  No results found for this or any previous visit (from the past 48 hour(s)).   Blood Alcohol level:  Lab Results  Component Value Date   ETH 119 (H) 02/21/2021   ETH 47 (H) 01/25/2020    Metabolic Disorder Labs: Lab Results  Component Value Date   HGBA1C 4.3 (L) 08/26/2018   MPG 76.71 08/26/2018   No results found for: PROLACTIN Lab Results  Component Value Date   CHOL 214 (H) 02/21/2021   TRIG 135 02/21/2021   HDL 58 02/21/2021   CHOLHDL 3.7 02/21/2021   VLDL 27 02/21/2021   LDLCALC 129 (H) 02/21/2021   LDLCALC 121 (H) 09/30/2017    Physical Findings: AIMS:  , ,  ,  ,    CIWA:  CIWA-Ar Total: 3 COWS:     Musculoskeletal: Strength & Muscle Tone: decreased Gait & Station: shuffle Patient leans: N/A  Psychiatric Specialty Exam:  Presentation  General Appearance: Disheveled  Eye Contact:Fair  Speech:Normal Rate  Speech Volume:Normal  Handedness:Right   Mood and Affect  Mood:Dysphoric; Depressed; Anxious  Affect:Congruent   Thought Process  Thought Processes:Goal Directed  Descriptions of Associations:Circumstantial  Orientation:Full (Time, Place and Person)  Thought Content:Rumination  History of Schizophrenia/Schizoaffective disorder:No  Duration of Psychotic Symptoms:No data recorded Hallucinations:Hallucinations: Auditory  Ideas of Reference:Delusions  Suicidal Thoughts:Suicidal Thoughts: Yes,  Passive SI Active Intent and/or Plan: Without Intent SI Passive Intent and/or Plan: Without Intent  Homicidal Thoughts:Homicidal Thoughts: No   Sensorium  Memory:Immediate Poor; Recent Poor; Remote Poor  Judgment:Impaired  Insight:Lacking   Executive Functions  Concentration:Fair  Attention Span:Fair  Recall:Fair  Fund of Knowledge:Fair  Language:Fair   Psychomotor Activity  Psychomotor Activity:Psychomotor Activity: Increased   Assets  Assets:Desire for Improvement; Resilience   Sleep  Sleep:Sleep: Fair    Physical Exam: Physical Exam Vitals and nursing note reviewed.  Constitutional:      Appearance: She is obese.  HENT:     Head: Normocephalic and atraumatic.  Pulmonary:     Effort: Pulmonary effort is normal.  Neurological:     General: No focal deficit present.     Mental Status: She is alert and oriented to person, place, and time.   Review of Systems  Cardiovascular:  Positive for chest pain.  Musculoskeletal:  Positive for back pain, joint pain, myalgias and neck pain.  All other systems reviewed and are negative. Blood pressure 125/75, pulse 60, temperature 98.4 F (36.9 C), temperature source Oral, resp. rate 16, height 5\' 7"  (1.702 m), weight 87.1 kg, SpO2 99 %. Body mass index is 30.07 kg/m.   Treatment Plan Summary: Daily contact with patient to assess and evaluate symptoms and progress in treatment, Medication management, and Plan patient is seen and examined.  Patient is a 55 year old female with the above-stated past psychiatric history who is seen in follow-up.  Diagnosis: 1.  Reported history of bipolar disorder. 2.  Cocaine dependence. 3.  Cocaine withdrawal. 4.  Alcohol dependence. 5.  Alcohol withdrawal, uncomplicated 6.  Posttraumatic stress disorder. 7.  Reported generalized anxiety disorder. 8.  Hypertension. 9.  Chronic low back pain 10.  Peripheral neuropathy. 11.  Prolonged QTc interval. 12.  COPD/asthma 13.   History of PEA cardiac arrest secondary to respiratory failure from cocaine abuse. 14.  Echocardiogram from 08/27/2018 showed an ejection fraction between 65 and 70%. 15.  Cardiac catheterization on 08/30/2018 showed a 30% proximal RCA lesion, normal left ventricular ejection fraction.  Pertinent findings on examination today: 1.  Patient is essentially unchanged with regard to her psychiatric evaluation from 02/22/2021. 2.  Patient has limited insight with regard to what we can provide her with regard to housing- She want to leave her current housing to avoid relapse and at the same time is worried about her Section 8 housing.  Fear of loosing section 8 housing. 3.  We discussed the fact that she would not be able to get into a rehabilitation facility if she continued on alprazolam as well as hydrocodone. 4.  Patient agrees with detoxification from these substances as well.  Plan: 1.  Continue albuterol as needed for wheezing or shortness of breath. 2.  Continue amlodipine to 5 mg p.o. daily for hypertension and chest pain. 3.  Continue Lipitor 40 mg p.o. daily for hyperlipidemia. 4.  Continue Wellbutrin short acting 75 mg p.o. daily for depression. 5.  Continue clonidine 0.1 mg p.o. 3 times daily as needed systolic blood pressure greater than 160. 6.  Get an EKG this AM. 7.  Continue Imdur to 75 mg p.o. daily for chest pain. 8.  Add opiate detox protocol. 9.  Continue Lopressor to 25 mg p.o. twice daily for heart rate and hypertension. 10.  Continue Risperdal agitation protocol as needed. 11.  Continue trazodone 300 mg p.o. nightly for insomnia. 12.  Disposition planning-in progress.  Earney NavyJosephine C Krysta Bloomfield, NP-PMHNP-BC 02/23/2021, 11:13 AM

## 2021-02-23 NOTE — Progress Notes (Signed)
Patient said she needs help. She needs to go to a long term facility. She really want to work hard to get her life together. She do not want to go back to her apartment. She wants a new start. She having heath issues. And she needs to get her life together.She has not paid rent. And she do not know what to do at this point. RN notify.

## 2021-02-24 ENCOUNTER — Ambulatory Visit (HOSPITAL_COMMUNITY)
Admit: 2021-02-24 | Discharge: 2021-02-24 | Disposition: A | Discharge status: Home / Self Care | Payer: Medicare Other | Attending: Psychiatry | Admitting: Psychiatry

## 2021-02-24 LAB — HEMOGLOBIN A1C
Hgb A1c MFr Bld: 4.8 % (ref 4.8–5.6)
Mean Plasma Glucose: 91.06 mg/dL

## 2021-02-24 MED ORDER — LIDOCAINE 5 % EX PTCH
2.0000 | MEDICATED_PATCH | CUTANEOUS | Status: DC
  Filled 2021-02-24 (×2): qty 2

## 2021-02-24 MED ORDER — THIAMINE HCL 100 MG PO TABS
100.0000 mg | ORAL_TABLET | Freq: Every day | ORAL | Status: DC
  Administered 2021-02-24 – 2021-03-04 (×9): 100 mg via ORAL
  Filled 2021-02-24 (×11): qty 1

## 2021-02-24 MED ORDER — FOLIC ACID 1 MG PO TABS
1.0000 mg | ORAL_TABLET | Freq: Every day | ORAL | Status: DC
  Administered 2021-02-24 – 2021-03-04 (×9): 1 mg via ORAL
  Filled 2021-02-24 (×12): qty 1

## 2021-02-24 MED ORDER — BUPROPION HCL 75 MG PO TABS
75.0000 mg | ORAL_TABLET | Freq: Once | ORAL | Status: AC
  Administered 2021-02-24: 75 mg via ORAL
  Filled 2021-02-24: qty 1

## 2021-02-24 MED ORDER — MELOXICAM 15 MG PO TABS
15.0000 mg | ORAL_TABLET | Freq: Every day | ORAL | Status: DC
  Administered 2021-02-25 – 2021-03-04 (×8): 15 mg via ORAL
  Filled 2021-02-24 (×10): qty 1

## 2021-02-24 MED ORDER — LIDOCAINE 5 % EX PTCH
2.0000 | MEDICATED_PATCH | Freq: Every day | CUTANEOUS | Status: DC
  Administered 2021-02-24: 2 via TRANSDERMAL
  Filled 2021-02-24 (×3): qty 2

## 2021-02-24 MED ORDER — DOXEPIN HCL 100 MG PO CAPS
100.0000 mg | ORAL_CAPSULE | Freq: Every day | ORAL | Status: DC
  Administered 2021-02-24: 100 mg via ORAL
  Filled 2021-02-24: qty 1
  Filled 2021-02-24: qty 4
  Filled 2021-02-24: qty 1

## 2021-02-24 MED ORDER — HYDROXYZINE HCL 25 MG PO TABS
25.0000 mg | ORAL_TABLET | Freq: Three times a day (TID) | ORAL | Status: DC | PRN
  Administered 2021-02-24 – 2021-02-25 (×3): 25 mg via ORAL
  Filled 2021-02-24 (×3): qty 1

## 2021-02-24 MED ORDER — DICLOFENAC SODIUM 1 % EX GEL
4.0000 g | Freq: Four times a day (QID) | CUTANEOUS | Status: DC
  Administered 2021-02-24 (×2): 4 g via TOPICAL
  Filled 2021-02-24: qty 100

## 2021-02-24 MED ORDER — BUPROPION HCL 75 MG PO TABS
150.0000 mg | ORAL_TABLET | Freq: Every day | ORAL | Status: DC
  Administered 2021-02-25: 150 mg via ORAL
  Filled 2021-02-24 (×2): qty 2

## 2021-02-24 NOTE — [facility] Counselor (Signed)
CSW spoke with pt about aftercare plans. Pt states that she would like to go to treatment in [place], [place]. CSW asked if pt has transportation there and pt stated she does not. CSW discussed [facility], [facility], [facility], [facility] and [facility]. Pt stated she was not interested in anywhere she has to work because of [employer]. Pt agreed to have a referral sent to [facility]. CSW informed pt that this facility is extremely difficult to get into so that it is important to look at other options. Pt eventually agreed to have a referral sent to [facility] as well. CSW encouraged pt to also look at other options due to [facility] also being difficult to get into but pt declined. Pt repeated many times throughout the conversation about how she was not safe to return home and needed a concrete plan. Pt stated she would also like to call her sister in [place] about facilities. CSW reminded pt that she would need transportation if this is the route she went.   Fredirick Lathe, LCSWA Clinicial Social Worker [employer]

## 2021-02-24 NOTE — Progress Notes (Signed)
[facility] MD Progress Note  02/24/2021 3:30 PM MASSA PE  MRN:  [id number] Subjective:  Amanda [name], [phone] who was brought to [facility] (02/21/2021) by [facility] police for SI with a plan. Past psychiatric history of bipolar disorder, cocaine use disorder vs cocaine dependence, alcohol use disorder vs alcohol dependence.   Past psychiatric hospitalization: [facility] (11/16/2014 - 11/21/2014) for SI without a plan Past suicide attempts: reported 7 attempts over past 6 months in chart review  Today (02/24/2021) patient was lying comfortably in bed with no trembling or diaphoresis  and seemed to be less melancholic than on admission (02/21/2021).  Stated that her mood is mildly improving,her appetite has improved, but that she is still not sleeping well.  Stated that still has constant SI and that intensity of SI has not decreased since admission. Denied HI and auditory hallucinations, admits to visual hallucinations. Stated that she sees shadows. Also reported a "crawling on her skin" sensation.  Patient was initially pleasant in manner until the social worker came in informing her that there is no method of transport to [place], [place] where her sister and brother-in-law lives. Patient responded in an irritated tone and persevering on how she cannot go to back to her old [address] due to safety. When brought up the conversation of rehab facility in the area, she brought up that 1) because of her back pain, she is on disabilities and is unable to work, 2) that being in a short term facility (about 1 month) is not safe for her. She insisted that she needs a longer term housing (wants 9 -12 months), that does not require her to work due to her back pain. Very adamant on that she "she won't make it without a facility".  Patient does not want to go to the rehab facilities in [place], [place]. Rather patient wants to facilities in [place], [place] because her sister and  brother-in-law lives there. Patient's sister in [place], [place]: Quenten Raven [phone] Called patient's sister 2 times this afternoon with no answer. Left no voicemail. Will try again tomorrow.  Patient provided 2 phone numbers for different addiction facilities in [place], [place]: [facility] [place] - [phone]  [facility]til[facility] Median [phone]  When asked again about the origin of her back pain, patient was vague and stated that she has had imaging.  Patient was also upset that her medications were changed, and that the medicines for her back is not helping at all.  Patient also admits to chest tightness last night at rest, intermittent feelings of hot and cold, and moderate alcohol craving.  VS stable today Na 141 (02/21/2021) TSH 1.868 (02/21/2021) QTc 498 (02/21/2021)   On chart review PMHx: - Cardiac arrest with PEA (08/25/2020) - Left heart cath for NSTEMI (08/30/2020)   Principal Problem: <principal problem not specified> Diagnosis: Active Problems:   Bipolar disorder with current episode depressed (HCC)  Total Time spent with patient: 20 minutes  Past Psychiatric History: Reported past psychiatric history significant for bipolar disorder, cocaine use disorder, cocaine dependence, alcohol dependence, alcohol use disorder, benzodiazepine use disorder, opiate use disorder  Past Medical History:  Past Medical History:  Diagnosis Date   Anxiety    Arthritis    lower back   Asthma    Bipolar disorder (HCC)    Carpal tunnel syndrome, bilateral    Chronic back pain    Depression    Diverticulosis    Fx. left wrist    GERD (  gastroesophageal reflux disease)    Heart murmur    "born with"   Heart murmur    HLD (hyperlipidemia)    Hypertension    IBS (irritable bowel syndrome)    Internal hemorrhoids    Muscle spasms of neck    Neuropathy    PUD (peptic ulcer disease)     Past Surgical History:  Procedure Laterality Date   ARTHRODESIS METATARSALPHALANGEAL JOINT (MTPJ)  Right 04/29/2016   Procedure: RIGHT GREAT TOE METATARSOPHALANGEAL JOINT (MTPJ) FUSION, HARDWARE REMOVAL;  Surgeon: Tarry Kos, MD;  Location: [facility];  Service: Orthopedics;  Laterality: Right;  RIGHT GREAT TOE METATARSOPHALANGEAL JOINT (MTPJ) FUSION, HARDWARE REMOVAL   CARPAL TUNNEL RELEASE Right 2003   CHOLECYSTECTOMY N/A 02/09/2014   Procedure: LAPAROSCOPIC CHOLECYSTECTOMY WITH INTRAOPERATIVE CHOLANGIOGRAM;  Surgeon: Valarie Merino, MD;  Location: [facility];  Service: General;  Laterality: N/A;   CHOLECYSTECTOMY     FOOT SURGERY     HAMMER TOE FUSION Bilateral 2008   HARDWARE REMOVAL Right 04/29/2016   Procedure: HARDWARE REMOVAL;  Surgeon: Tarry Kos, MD;  Location: [facility];  Service: Orthopedics;  Laterality: Right;  HARDWARE REMOVAL   LEFT HEART CATH AND CORONARY ANGIOGRAPHY N/A 08/30/2018   Procedure: LEFT HEART CATH AND CORONARY ANGIOGRAPHY;  Surgeon: Marykay Lex, MD;  Location: [facility] CV [facility];  Service: Cardiovascular;  Laterality: N/A;   ROTATOR CUFF REPAIR Right 2011   WRIST FRACTURE SURGERY Left    Family History:  Family History  Problem Relation Age of Onset   Pancreatic cancer Mother    CAD Father    Diabetes Mellitus II Father    Heart failure Father    Diabetes Father    Breast cancer Paternal Grandmother    HIV/AIDS Brother    Other Sister        Brain tumor   Colon cancer Neg Hx    Family Psychiatric  History: Patient did not disclose any psychiatric family history  Social History:  Social History   Substance and Sexual Activity  Alcohol Use Yes   Comment: heavily daily     Social History   Substance and Sexual Activity  Drug Use Yes   Types: Cocaine, "Crack" cocaine, Marijuana   Comment: last used two years ago    Social History   Socioeconomic History   Marital status: Single    Spouse name: Not on file   Number of children: 0   Years of education: 12   Highest education level: Not on file  Occupational  History   Occupation: Cook    Comment: [employer]  Tobacco Use   Smoking status: Light Smoker    Pack years: 0.00    Types: Cigarettes   Smokeless tobacco: Never   Tobacco comments:    "takes a puff sometimes"  Substance and Sexual Activity   Alcohol use: Yes    Comment: heavily daily   Drug use: Yes    Types: Cocaine, "Crack" cocaine, Marijuana    Comment: last used two years ago   Sexual activity: Yes    Partners: Female  Other Topics Concern   Not on file  Social History Narrative   ** Merged History Encounter **       Pt lives at home alone. She has a HS education level and does not have any children.  She drinks 2 cups of caffeine daily.   Social Determinants of Health   Financial Resource Strain: Not on file  Food Insecurity:  Not on file  Transportation Needs: Not on file  Physical Activity: Not on file  Stress: Not on file  Social Connections: Not on file   Additional Social History:                         Sleep: Fair  Appetite:  Fair  Current Medications: Current Facility-Administered Medications  Medication Dose Route Frequency Provider Last Rate Last Admin   acetaminophen (TYLENOL) tablet 650 mg  650 mg Oral Q6H PRN Antonieta Pert, MD   650 mg at 02/23/21 1625   albuterol (VENTOLIN HFA) 108 (90 Base) MCG/ACT inhaler 2 puff  2 puff Inhalation Q4H PRN Antonieta Pert, MD       alum & mag hydroxide-simeth (MAALOX/MYLANTA) 200-200-20 MG/5ML suspension 30 mL  30 mL Oral Q4H PRN Antonieta Pert, MD   30 mL at 02/23/21 1736   amLODipine (NORVASC) tablet 5 mg  5 mg Oral Daily Antonieta Pert, MD   5 mg at 02/24/21 0817   atorvastatin (LIPITOR) tablet 40 mg  40 mg Oral Daily Antonieta Pert, MD   40 mg at 02/24/21 0816   [START ON 02/25/2021] buPROPion (WELLBUTRIN) tablet 150 mg  150 mg Oral Daily Princess Bruins, DO       buPROPion [facility]) tablet 75 mg  75 mg Oral Once Princess Bruins, DO       carbamazepine (TEGRETOL) chewable tablet 200 mg   200 mg Oral BID Antonieta Pert, MD   200 mg at 02/24/21 1884   cloNIDine (CATAPRES) tablet 0.1 mg  0.1 mg Oral TID PRN Antonieta Pert, MD       diclofenac Sodium (VOLTAREN) 1 % topical gel 4 g  4 g Topical QID Antonieta Pert, MD   4 g at 02/24/21 1120   doxepin (SINEQUAN) capsule 100 mg  100 mg Oral QHS Antonieta Pert, MD       feeding supplement (ENSURE ENLIVE / ENSURE PLUS) liquid 237 mL  237 mL Oral BID BM Antonieta Pert, MD   237 mL at 02/23/21 1600   fluticasone (FLONASE) 50 MCG/ACT nasal spray 1 spray  1 spray Each Nare Daily Bobbitt, Shalon E, NP   1 spray at 02/24/21 0817   folic acid (FOLVITE) tablet 1 mg  1 mg Oral Daily Princess Bruins, DO       hydrOXYzine (ATARAX/VISTARIL) tablet 25 mg  25 mg Oral TID PRN Antonieta Pert, MD   25 mg at 02/24/21 1138   isosorbide mononitrate (IMDUR) 24 hr tablet 75 mg  75 mg Oral Daily Antonieta Pert, MD   75 mg at 02/24/21 0815   lidocaine (LIDODERM) 5 % 2 patch  2 patch Transdermal Daily Antonieta Pert, MD   2 patch at 02/24/21 1122   LORazepam (ATIVAN) tablet 1 mg  1 mg Oral Q6H PRN Antonieta Pert, MD   1 mg at 02/22/21 0011   magnesium hydroxide (MILK OF MAGNESIA) suspension 30 mL  30 mL Oral Daily PRN Antonieta Pert, MD   30 mL at 02/22/21 1848   [START ON 02/25/2021] meloxicam (MOBIC) tablet 15 mg  15 mg Oral Daily Antonieta Pert, MD       methocarbamol (ROBAXIN) tablet 750 mg  750 mg Oral Q8H PRN Antonieta Pert, MD   750 mg at 02/24/21 1138   metoprolol tartrate (LOPRESSOR) tablet 25 mg  25 mg Oral BID Antonieta Pert,  MD   25 mg at 02/24/21 0816   pantoprazole (PROTONIX) EC tablet 40 mg  40 mg Oral q morning Antonieta Pertlary, Greg Lawson, MD   40 mg at 02/24/21 1100   risperiDONE (RISPERDAL M-TABS) disintegrating tablet 2 mg  2 mg Oral Q8H PRN Antonieta Pertlary, Greg Lawson, MD       And   ziprasidone (GEODON) injection 20 mg  20 mg Intramuscular Q12H PRN Antonieta Pertlary, Greg Lawson, MD       thiamine tablet 100 mg  100 mg Oral  Daily Princess BruinsNguyen, Chiron Campione, DO       Vitamin D (Ergocalciferol) (DRISDOL) capsule 50,000 Units  50,000 Units Oral Q7 days Antonieta Pertlary, Greg Lawson, MD   50,000 Units at 02/22/21 1256    Lab Results: No results found for this or any previous visit (from the past 48 hour(s)).  Blood Alcohol level:  Lab Results  Component Value Date   ETH 119 (H) 02/21/2021   ETH 47 (H) 01/25/2020    Metabolic Disorder Labs: Lab Results  Component Value Date   HGBA1C 4.8 02/21/2021   MPG 91.06 02/21/2021   MPG 76.71 08/26/2018   No results found for: PROLACTIN Lab Results  Component Value Date   CHOL 214 (H) 02/21/2021   TRIG 135 02/21/2021   HDL 58 02/21/2021   CHOLHDL 3.7 02/21/2021   VLDL 27 02/21/2021   LDLCALC 129 (H) 02/21/2021   LDLCALC 121 (H) 09/30/2017    Physical Findings: AIMS:  , ,  ,  ,    CIWA:  CIWA-Ar Total: 2 COWS:     Musculoskeletal: Strength & Muscle Tone: within normal limits Gait & Station: normal Patient leans: N/A  Psychiatric Specialty Exam:  Presentation  General Appearance: Casual; Appropriate for Environment  Eye Contact:Fair  Speech:Clear and Coherent  Speech Volume:Normal  Handedness:Right   Mood and Affect  Mood:Depressed  Affect:Congruent   Thought Process  Thought Processes:Coherent  Descriptions of Associations:Intact  Orientation:Full (Time, Place and Person)  Thought Content:Logical  History of Schizophrenia/Schizoaffective disorder:Yes  Duration of Psychotic Symptoms:No data recorded Hallucinations:Hallucinations: None  Ideas of Reference:None  Suicidal Thoughts:Suicidal Thoughts: Yes, Passive SI Passive Intent and/or Plan: Without Plan  Homicidal Thoughts:Homicidal Thoughts: No   Sensorium  Memory:Immediate Good; Remote Good; Recent Good  Judgment:Fair  Insight:Fair   Executive Functions  Concentration:Good  Attention Span:Good  Recall:Good  Fund of Knowledge:Good  Language:Good   Psychomotor Activity   Psychomotor Activity:Psychomotor Activity: Psychomotor Retardation   Assets  Assets:Communication Skills; Desire for Improvement; Housing; Resilience   Sleep  Sleep:Sleep: Poor Number of Hours of Sleep: 4    Physical Exam: Physical Exam HENT:     Head: Normocephalic.  Cardiovascular:     Rate and Rhythm: Normal rate.  Pulmonary:     Effort: Pulmonary effort is normal.  Neurological:     Mental Status: She is alert and oriented to person, place, and time.   Review of Systems  Constitutional:  Negative for chills.  HENT:  Negative for congestion.   Respiratory:  Negative for cough.   Cardiovascular:  Negative for chest pain.  Gastrointestinal:  Negative for abdominal pain and nausea.  Musculoskeletal:  Positive for back pain.  Skin:  Positive for itching.  Psychiatric/Behavioral:  Positive for depression and suicidal ideas.   Blood pressure 126/89, pulse 62, temperature (!) 97.4 F (36.3 C), temperature source Oral, resp. rate 18, height 5\' 7"  (1.702 m), weight 87.1 kg, SpO2 100 %. Body mass index is 30.07 kg/m.   Treatment Plan Summary: Daily  contact with patient to assess and evaluate symptoms and progress in treatment and Plan    Amanda [name], [phone] F who was brought to [facility] (02/21/2021) by [facility] police for SI with a plan. Past psychiatric history of bipolar disorder, cocaine use disorder vs cocaine dependence, alcohol use disorder vs alcohol dependence.   Past psychiatric hospitalization: [facility] (11/16/2014 - 11/21/2014) for SI without a plan Past suicide attempts: reported 7 attempts over past 6 months in chart review  VS stable today Na 141 (02/21/2021) TSH 1.868 (02/21/2021) QTc 498 (02/21/2021) AST/ALT 34/34 (02/21/2021)  On chart review PMHx: - Cardiac arrest with PEA (08/25/2020) - Left heart cath for NSTEMI (08/30/2020)  Today (02/24/2021) patient's mood has seem to improve over the weekend, and she did not  show any symptoms of alcohol withdrawal this morning. Initially patient was conversing pleasantly until social worker told her that we did not have the means to transport her to [place], [place]. Patient became irritated, stating that "no one hears me". She then stated that she is still depressed and has SI, that it isn't safe for her to be here. Patient stated that she needs a long term facility that does not require work because of her back pain. Disposition planning seems to be the main issue with discharge.   DEPRESSION - Add 1 time dose of Wellbutrin 75mg  PO today to increase total dosage to 150mg  - Increase daily Wellbutrin short acting from 75mg  to 150mg  qAM  MOOD STABILITY - Continue Carbamazepine chewable tablet 200mg  PO 2 times daily  ANXIETY - Continue hydroxyzine tablet 25mg  PO 3 times daily PRN for itching and anxiety  INSOMNIA - Increase Doxepin 50mg  to 100mg  PO nighly for insomnia and titrate  BACK PAIN - Continue Robaxin 750mg  PO 3 times daily as needed for pain - Continue acetaminophen tablet 650mg  q6HR PRN - Increase Mobic 7.5mg  to 15mg  PO daily for osteoarthritic pain - Add diclofenac sodium 1% topical gel 4 times daily for pain - Add 2x lidoderm 5% patches daily for pain  NUTRITION - Continue Vitamin D Drisdol 50,000 units PO q7 days second dose today for nutritional supplementation - Continue feeding supplement (ENSURE) PO 2 times daily between meals - Add Folic acid and thiamine supplementation for MCV 101 and history of chronic alcohol abuse  CHEST PAIN - Continue Imdur 75mg  24hour tablet PO daily for chest pain  HYPERTENSION - Continue amlodipine 5mg  PO daily for hypertension and chest pain - Continue clonidine 0.1mg  PO 3 times daily as needed for systolic blood pressure greater than 160 - Continue Lopressor 25mg  PO twice daily for health rate and hypertension  HYPERLIPIDEMIA - Continue Lipitor 40mg  PO daily for hyperlipidemia  GERD - Continue alum & mag  hydroxide-simeth 12ml PO every 4hrs PRN  CONSTIPATION - Continue magnesium hydroxide 77ml PO daily for mild constipation  ASTHMA - Continue albuterol as needed for wheezing and shortness of breath  NASAL CONGESTION - Continue fluticasone 20mcg/act nasal spray daily  AGITATION - Continue Risperdal agitation protocol as needed - Continue Geodon agitation protocol as needed   , DO 02/24/2021, 3:30 PM

## 2021-02-24 NOTE — Tx Team (Signed)
Interdisciplinary Treatment and Diagnostic Plan Update  02/24/2021 Time of Session: 10:00am Amanda [name] MRN: [id number]  Principal Diagnosis: <principal problem not specified>  Secondary Diagnoses: Active Problems:   Bipolar disorder with current episode depressed (HCC)   Current Medications:  Current Facility-Administered Medications  Medication Dose Route Frequency Provider Last Rate Last Admin   acetaminophen (TYLENOL) tablet 650 mg  650 mg Oral Q6H PRN Antonieta Pert, MD   650 mg at 02/23/21 1625   albuterol (VENTOLIN HFA) 108 (90 Base) MCG/ACT inhaler 2 puff  2 puff Inhalation Q4H PRN Antonieta Pert, MD       alum & mag hydroxide-simeth (MAALOX/MYLANTA) 200-200-20 MG/5ML suspension 30 mL  30 mL Oral Q4H PRN Antonieta Pert, MD   30 mL at 02/23/21 1736   amLODipine (NORVASC) tablet 5 mg  5 mg Oral Daily Antonieta Pert, MD   5 mg at 02/24/21 0817   atorvastatin (LIPITOR) tablet 40 mg  40 mg Oral Daily Antonieta Pert, MD   40 mg at 02/24/21 0816   buPROPion [facility]) tablet 75 mg  75 mg Oral Daily Antonieta Pert, MD   75 mg at 02/24/21 0816   carbamazepine (TEGRETOL) chewable tablet 200 mg  200 mg Oral BID Antonieta Pert, MD   200 mg at 02/24/21 8127   cloNIDine (CATAPRES) tablet 0.1 mg  0.1 mg Oral TID PRN Antonieta Pert, MD       diclofenac Sodium (VOLTAREN) 1 % topical gel 4 g  4 g Topical QID Antonieta Pert, MD   4 g at 02/24/21 1120   doxepin (SINEQUAN) capsule 100 mg  100 mg Oral QHS Antonieta Pert, MD       feeding supplement (ENSURE ENLIVE / ENSURE PLUS) liquid 237 mL  237 mL Oral BID BM Antonieta Pert, MD   237 mL at 02/23/21 1600   fluticasone (FLONASE) 50 MCG/ACT nasal spray 1 spray  1 spray Each Nare Daily Bobbitt, Shalon E, NP   1 spray at 02/24/21 0817   hydrOXYzine (ATARAX/VISTARIL) tablet 25 mg  25 mg Oral TID PRN Antonieta Pert, MD       isosorbide mononitrate (IMDUR) 24 hr tablet 75 mg  75 mg Oral Daily Antonieta Pert, MD   75 mg at 02/24/21 0815   lidocaine (LIDODERM) 5 % 2 patch  2 patch Transdermal Daily Antonieta Pert, MD   2 patch at 02/24/21 1122   LORazepam (ATIVAN) tablet 1 mg  1 mg Oral Q6H PRN Antonieta Pert, MD   1 mg at 02/22/21 0011   magnesium hydroxide (MILK OF MAGNESIA) suspension 30 mL  30 mL Oral Daily PRN Antonieta Pert, MD   30 mL at 02/22/21 1848   [START ON 02/25/2021] meloxicam (MOBIC) tablet 15 mg  15 mg Oral Daily Antonieta Pert, MD       methocarbamol (ROBAXIN) tablet 750 mg  750 mg Oral Q8H PRN Antonieta Pert, MD   750 mg at 02/23/21 2136   metoprolol tartrate (LOPRESSOR) tablet 25 mg  25 mg Oral BID Antonieta Pert, MD   25 mg at 02/24/21 0816   pantoprazole (PROTONIX) EC tablet 40 mg  40 mg Oral q morning Antonieta Pert, MD   40 mg at 02/24/21 1100   risperiDONE (RISPERDAL M-TABS) disintegrating tablet 2 mg  2 mg Oral Q8H PRN Antonieta Pert, MD       And   ziprasidone (  GEODON) injection 20 mg  20 mg Intramuscular Q12H PRN Antonieta Pert, MD       Vitamin D (Ergocalciferol) (DRISDOL) capsule 50,000 Units  50,000 Units Oral Q7 days Antonieta Pert, MD   50,000 Units at 02/22/21 1256   PTA Medications: Medications Prior to Admission  Medication Sig Dispense Refill Last Dose   albuterol (VENTOLIN HFA) 108 (90 Base) MCG/ACT inhaler Inhale 2 puffs into the lungs every 4 (four) hours as needed for wheezing or shortness of breath.      amLODipine (NORVASC) 5 MG tablet Take 1 tablet (5 mg total) by mouth daily. 30 tablet 6    atorvastatin (LIPITOR) 40 MG tablet Take 1 tablet (40 mg total) by mouth daily. 30 tablet 6    buPROPion (WELLBUTRIN XL) 300 MG 24 hr tablet Take 1 tablet (300 mg total) by mouth every morning.      hydrOXYzine (ATARAX/VISTARIL) 25 MG tablet Take 1 tablet (25 mg total) by mouth every 6 (six) hours as needed for anxiety. 30 tablet 0    isosorbide mononitrate (IMDUR) 60 MG 24 hr tablet Take 60 mg by mouth daily.       isosorbide mononitrate (IMDUR) 60 MG 24 hr tablet Take 1 tablet (60 mg total) by mouth daily.      metoprolol tartrate (LOPRESSOR) 50 MG tablet Take 1 tablet (50 mg total) by mouth 2 (two) times daily.      pantoprazole (PROTONIX) 40 MG tablet Take 40 mg by mouth every morning.      traZODone (DESYREL) 300 MG tablet Take 1 tablet (300 mg total) by mouth at bedtime. 30 tablet 0     Patient Stressors: Substance abuse Other: living situation  Patient Strengths: Capable of independent living Barrister's clerk for treatment/growth Supportive family/friends  Treatment Modalities: Medication Management, Group therapy, Case management,  1 to 1 session with clinician, Psychoeducation, Recreational therapy.   Physician Treatment Plan for Primary Diagnosis: <principal problem not specified> Long Term Goal(s): Improvement in symptoms so as ready for discharge   Short Term Goals: Ability to identify changes in lifestyle to reduce recurrence of condition will improve Ability to verbalize feelings will improve Ability to disclose and discuss suicidal ideas Ability to demonstrate self-control will improve Ability to identify and develop effective coping behaviors will improve Ability to maintain clinical measurements within normal limits will improve Compliance with prescribed medications will improve Ability to identify triggers associated with substance abuse/mental health issues will improve  Medication Management: Evaluate patient's response, side effects, and tolerance of medication regimen.  Therapeutic Interventions: 1 to 1 sessions, Unit Group sessions and Medication administration.  Evaluation of Outcomes: Progressing  Physician Treatment Plan for Secondary Diagnosis: Active Problems:   Bipolar disorder with current episode depressed (HCC)  Long Term Goal(s): Improvement in symptoms so as ready for discharge   Short Term Goals: Ability to identify changes in lifestyle  to reduce recurrence of condition will improve Ability to verbalize feelings will improve Ability to disclose and discuss suicidal ideas Ability to demonstrate self-control will improve Ability to identify and develop effective coping behaviors will improve Ability to maintain clinical measurements within normal limits will improve Compliance with prescribed medications will improve Ability to identify triggers associated with substance abuse/mental health issues will improve     Medication Management: Evaluate patient's response, side effects, and tolerance of medication regimen.  Therapeutic Interventions: 1 to 1 sessions, Unit Group sessions and Medication administration.  Evaluation of Outcomes: Progressing   RN Treatment Plan  for Primary Diagnosis: <principal problem not specified> Long Term Goal(s):   medication stabilization, elimination of SI thoughts, development of comprehensive mental wellness plan.    Short Term Goals: Ability to verbalize frustration and anger appropriately will improve, Ability to participate in decision making will improve, Ability to verbalize feelings will improve, Ability to disclose and discuss suicidal ideas, and Ability to identify and develop effective coping behaviors will improve  Medication Management: RN will administer medications as ordered by provider, will assess and evaluate patient's response and provide education to patient for prescribed medication. RN will report any adverse and/or side effects to prescribing provider.  Therapeutic Interventions: 1 on 1 counseling sessions, Psychoeducation, Medication administration, Evaluate responses to treatment, Monitor vital signs and CBGs as ordered, Perform/monitor CIWA, COWS, AIMS and Fall Risk screenings as ordered, Perform wound care treatments as ordered.  Evaluation of Outcomes: Progressing   LCSW Treatment Plan for Primary Diagnosis: <principal problem not specified> Long Term Goal(s):  Safe transition to appropriate next level of care at discharge, Engage patient in therapeutic group addressing interpersonal concerns.  Short Term Goals: Engage patient in aftercare planning with referrals and resources, Increase ability to appropriately verbalize feelings, Increase emotional regulation, and Increase skills for wellness and recovery  Therapeutic Interventions: Assess for all discharge needs, 1 to 1 time with Social worker, Explore available resources and support systems, Assess for adequacy in community support network, Educate family and significant other(s) on suicide prevention, Complete Psychosocial Assessment, Interpersonal group therapy.  Evaluation of Outcomes: Progressing   Progress in Treatment: Attending groups: No. Participating in groups: No. Taking medication as prescribed: Yes. Toleration medication: Yes. Family/Significant other contact made: No, will contact:  Theodoro Par[place] Patient understands diagnosis: Yes. Discussing patient identified problems/goals with staff: Yes. Medical problems stabilized or resolved: Yes. Denies suicidal/homicidal ideation: Yes. Issues/concerns per patient self-inventory: No. Other: n/a  New problem(s) identified: No, Describe:  none reported  New Short Term/Long Term Goal(s):   medication stabilization, elimination of SI thoughts, development of comprehensive mental wellness plan.    Patient Goals:  Patient reports that she wants to get into long term psychiatric care.   Discharge Plan or Barriers: none reported at this time.   Reason for Continuation of Hospitalization: Anxiety Depression Medication stabilization  Estimated Length of Stay: 3-5 days  Attendees: Patient: Britanee Vanblarcom 02/24/2021, 10:00am  Physician: Landry Mellow 02/24/2021, 10:00am  Nursing:    RN Care Manager:   Social Worker: Anson [place], LCSW 02/24/2021, 10:00am  Recreational Therapist:    Other:    Other:    Other:     Scribe for  Treatment Team: Beatris Si, LCSW 02/24/2021 11:36 AM

## 2021-02-24 NOTE — Progress Notes (Signed)
Recreation Therapy Notes  Date: 02/24/2021 Time: 930a  Topic: Leisure Participation   Goal Area(s) Addresses:  Patient will complete packet of holiday-theme worksheets, as a healthy free-time activity on unit.  Intervention: Independent Leisure   Education: Leisure Exposure- Word puzzles and Coloring pages   Comments: LRT provided pt a leisure packet in celebration of Independence Day. Pt given the option to complete the packet in the dayroom with MHT staff and peers or at their own pace in their room.   Janaa Acero, LRT/CTRS  Katelyne Galster G Tonika Eden 02/24/2021, 12:11 PM  

## 2021-02-24 NOTE — Progress Notes (Signed)
   02/24/21 2144  Psych Admission Type (Psych Patients Only)  Admission Status Voluntary  Psychosocial Assessment  Patient Complaints Anxiety;Other (Comment) (itching)  Eye Contact Fair  Facial Expression Anxious  Affect Appropriate to circumstance  Speech Logical/coherent  Interaction Guarded  Motor Activity Slow  Appearance/Hygiene Unremarkable  Behavior Characteristics Cooperative;Anxious  Mood Anxious  Thought Process  Coherency WDL  Content Blaming others  Delusions None reported or observed  Perception Hallucinations  Hallucination None reported or observed  Judgment Impaired  Confusion None  Danger to Self  Current suicidal ideation? Denies  Self-Injurious Behavior No self-injurious ideation or behavior indicators observed or expressed   Agreement Not to Harm Self Yes  Description of Agreement verbal agreement to contract for safety  Danger to Others  Danger to Others None reported or observed  D: Patient c/o of lower back pain but refuse pain cream offered. Pt pleasant and appreciative of help offered.   A: Medications administered as prescribed. Support and encouragement provided as needed.  R: Patient remains safe on the unit. Will continue to monitor for safety and stability.

## 2021-02-24 NOTE — Progress Notes (Signed)
D:  Patient denied HI.  Denied hearing voices. SI, contracts for safety, no plan.   Does see shadows.   A:  Medications administered per MD orders.  Emotional support and encouragement given patient. R:  Safety maintained with 15 minute checks.

## 2021-02-24 NOTE — Plan of Care (Signed)
Nurse discussed anxiety, depression and coping skills with patient.  

## 2021-02-24 NOTE — Progress Notes (Signed)
The patient rated her day as a "negative" day since she didn't get any rest last evening. In addition, she states that the medication is not working at this point. Her goal for tomorrow is to try to get into a rehab facility.

## 2021-02-24 NOTE — Progress Notes (Signed)
Patient did not attend morning orientation and goal setting group.  

## 2021-02-25 DIAGNOSIS — F315 Bipolar disorder, current episode depressed, severe, with psychotic features: Principal | ICD-10-CM

## 2021-02-25 DIAGNOSIS — F1010 Alcohol abuse, uncomplicated: Secondary | ICD-10-CM | POA: Diagnosis present

## 2021-02-25 MED ORDER — LIDOCAINE 5 % EX PTCH: 1.0000 | MEDICATED_PATCH | Freq: Two times a day (BID) | CUTANEOUS | Status: DC | PRN

## 2021-02-25 MED ORDER — HYDROXYZINE HCL 50 MG PO TABS
50.0000 mg | ORAL_TABLET | Freq: Three times a day (TID) | ORAL | Status: DC | PRN
  Administered 2021-02-27 – 2021-03-03 (×4): 50 mg via ORAL
  Filled 2021-02-25 (×4): qty 1

## 2021-02-25 MED ORDER — TRAZODONE HCL 150 MG PO TABS
300.0000 mg | ORAL_TABLET | Freq: Every evening | ORAL | Status: DC | PRN
  Administered 2021-02-25: 300 mg via ORAL
  Administered 2021-02-26: 150 mg via ORAL
  Administered 2021-02-27 – 2021-03-03 (×5): 300 mg via ORAL
  Filled 2021-02-25 (×7): qty 2

## 2021-02-25 MED ORDER — LIDOCAINE 5 % EX PTCH: 1.0000 | MEDICATED_PATCH | Freq: Every day | CUTANEOUS | Status: DC | PRN

## 2021-02-25 MED ORDER — HYDROXYZINE HCL 25 MG PO TABS
25.0000 mg | ORAL_TABLET | Freq: Once | ORAL | Status: AC
  Administered 2021-02-25: 25 mg via ORAL
  Filled 2021-02-25 (×2): qty 1

## 2021-02-25 MED ORDER — DULOXETINE HCL 30 MG PO CPEP
30.0000 mg | ORAL_CAPSULE | Freq: Every day | ORAL | Status: DC
  Administered 2021-02-25 – 2021-02-26 (×2): 30 mg via ORAL
  Filled 2021-02-25 (×5): qty 1

## 2021-02-25 MED ORDER — HALOPERIDOL 5 MG PO TABS
5.0000 mg | ORAL_TABLET | Freq: Two times a day (BID) | ORAL | Status: DC
  Administered 2021-02-25 – 2021-03-04 (×14): 5 mg via ORAL
  Filled 2021-02-25 (×21): qty 1

## 2021-02-25 MED ORDER — GABAPENTIN 100 MG PO CAPS
100.0000 mg | ORAL_CAPSULE | Freq: Three times a day (TID) | ORAL | Status: DC
  Administered 2021-02-26 (×3): 100 mg via ORAL
  Filled 2021-02-25 (×15): qty 1

## 2021-02-25 NOTE — Progress Notes (Signed)
Recreation Therapy Notes  Animal-Assisted Activity (AAA) Program Checklist/Progress Notes Patient Eligibility Criteria Checklist & Daily Group note for Rec Tx Intervention  Date: 7.5.22 Time: 1430 Location: [address]  AAA/T Program Assumption of Risk Form signed by Engineer, production or Parent Legal Guardian  YES  Patient is free of allergies or severe asthma  YES  Patient reports no fear of animals  YES   Patient reports no history of cruelty to animals YES  Patient understands his/her participation is voluntary  YES   Patient washes hands before animal contact YES   Patient washes hands after animal contact YES   Education: Charity fundraiser, Appropriate Animal Interaction   Education Outcome: Acknowledges understanding/In group clarification offered/Needs additional education.   Clinical Observations/Feedback: Pt did not attend pet therapy session.    Amanda [name], LRT/CTRS        Amanda [name] A 02/25/2021 3:48 PM

## 2021-02-25 NOTE — Progress Notes (Signed)
EKG results placed on the outside of shadow chart  Normal Sinus Rhythm Normal EKG  QT/QTC=430/440 ms

## 2021-02-25 NOTE — Significant Event (Signed)
Today I called [facility] at [address] to confirm that the patient has been getting prescriptions on Haldol 10mg  BID.

## 2021-02-25 NOTE — Progress Notes (Addendum)
[facility] MD Progress Note  02/25/2021 4:18 PM Amanda [name]  MRN:  [id number] Subjective:  Amanda [name], [phone] F who was brought to [facility] (02/21/2021) by [facility] police for SI with a plan. Past psychiatric history of bipolar disorder, cocaine use disorder vs cocaine dependence, alcohol use disorder vs alcohol dependence.  Today (02/25/2021) patient was sitting upright in bed and irritated. Patient was reading a brochure about a potential placement in [place]. Patient perseverates on that she needs [place] to provide her transport to [place], [place]. Stating that her apartment is unsafe, with other substance users around. Stated that her mood has not improved, and that she is still very anxious, paranoid and depressed. Patient stated that she did not sleep well, was not able to stay asleep. Stated that her appetite is still poor, where she will only pick at her food. Stated that her back is still bothering her, and that she wants to original pain medicines. Stating that the pain regiment we have her on now is inadequate.  She also complains that she feels confused and that her "head is cloudy". Patient would not expand on what she meant by confused. Patient admitted to [facility] with no plan, auditory hallucination of phone ringing, and visual hallucinations of shadows. Patient would not elaborate on her AVH. Denied HI.  Patient requested to have her Haldol 10mg  and her trazadone 300mg  again.    Principal Problem: Bipolar I disorder, most recent episode depressed, severe w psychosis (HCC) Diagnosis: Principal Problem:   Bipolar I disorder, most recent episode depressed, severe w psychosis (HCC) Active Problems:   Cocaine abuse (HCC)   Alcohol abuse  Total Time spent with patient: 20 minutes  Past Psychiatric History: Past psychiatric history of bipolar disorder, cocaine use disorder vs cocaine dependence, alcohol use disorder vs alcohol dependence.   Past psychiatric  hospitalization: [facility] (11/16/2014 - 11/21/2014) Past suicide attempts: 7 attempts over past 6 months   Patient stated that she attends [facility] for medical and pain, psychiatric management.   Past psych meds: Welbutrin, hydroxyzine, haldol, xanax  Past Medical History:  Past Medical History:  Diagnosis Date   Anxiety    Arthritis    lower back   Asthma    Bipolar disorder (HCC)    Carpal tunnel syndrome, bilateral    Chronic back pain    Depression    Diverticulosis    Fx. left wrist    GERD (gastroesophageal reflux disease)    Heart murmur    "born with"   Heart murmur    HLD (hyperlipidemia)    Hypertension    IBS (irritable bowel syndrome)    Internal hemorrhoids    Muscle spasms of neck    Neuropathy    PUD (peptic ulcer disease)     Past Surgical History:  Procedure Laterality Date   ARTHRODESIS METATARSALPHALANGEAL JOINT (MTPJ) Right 04/29/2016   Procedure: RIGHT GREAT TOE METATARSOPHALANGEAL JOINT (MTPJ) FUSION, HARDWARE REMOVAL;  Surgeon: [facility], MD;  Location: [facility];  Service: Orthopedics;  Laterality: Right;  RIGHT GREAT TOE METATARSOPHALANGEAL JOINT (MTPJ) FUSION, HARDWARE REMOVAL   CARPAL TUNNEL RELEASE Right 2003   CHOLECYSTECTOMY N/A 02/09/2014   Procedure: LAPAROSCOPIC CHOLECYSTECTOMY WITH INTRAOPERATIVE CHOLANGIOGRAM;  Surgeon: 2004, MD;  Location: [facility];  Service: General;  Laterality: N/A;   CHOLECYSTECTOMY     FOOT SURGERY     HAMMER TOE FUSION Bilateral 2008   HARDWARE REMOVAL Right 04/29/2016   Procedure: HARDWARE  REMOVAL;  Surgeon: Tarry Kos, MD;  Location: [facility];  Service: Orthopedics;  Laterality: Right;  HARDWARE REMOVAL   LEFT HEART CATH AND CORONARY ANGIOGRAPHY N/A 08/30/2018   Procedure: LEFT HEART CATH AND CORONARY ANGIOGRAPHY;  Surgeon: Marykay Lex, MD;  Location: [facility] INVASIVE CV LAB;  Service: Cardiovascular;  Laterality: N/A;   ROTATOR CUFF REPAIR Right 2011   WRIST FRACTURE  SURGERY Left    Family History:  Family History  Problem Relation Age of Onset   Pancreatic cancer Mother    CAD Father    Diabetes Mellitus II Father    Heart failure Father    Diabetes Father    Breast cancer Paternal Grandmother    HIV/AIDS Brother    Other Sister        Brain tumor   Colon cancer Neg Hx    Family Psychiatric  History: Patient denied any family psychiatric history, suicide, or substance abuse. Social History:  Social History   Substance and Sexual Activity  Alcohol Use Yes   Comment: heavily daily     Social History   Substance and Sexual Activity  Drug Use Yes   Types: Cocaine, "Crack" cocaine, Marijuana   Comment: last used two years ago    Social History   Socioeconomic History   Marital status: Single    Spouse name: Not on file   Number of children: 0   Years of education: 12   Highest education level: Not on file  Occupational History   Occupation: Cook    Comment: [employer]  Tobacco Use   Smoking status: Light Smoker    Pack years: 0.00    Types: Cigarettes   Smokeless tobacco: Never   Tobacco comments:    "takes a puff sometimes"  Substance and Sexual Activity   Alcohol use: Yes    Comment: heavily daily   Drug use: Yes    Types: Cocaine, "Crack" cocaine, Marijuana    Comment: last used two years ago   Sexual activity: Yes    Partners: Female  Other Topics Concern   Not on file  Social History Narrative   ** Merged History Encounter **       Pt lives at home alone. She has a HS education level and does not have any children.  She drinks 2 cups of caffeine daily.   Social Determinants of Health   Financial Resource Strain: Not on file  Food Insecurity: Not on file  Transportation Needs: Not on file  Physical Activity: Not on file  Stress: Not on file  Social Connections: Not on file   Additional Social History:                         Sleep: Fair  Appetite:  Fair  Current Medications: Current  Facility-Administered Medications  Medication Dose Route Frequency Provider Last Rate Last Admin   acetaminophen (TYLENOL) tablet 650 mg  650 mg Oral Q6H PRN Antonieta Pert, MD   650 mg at 02/25/21 1316   albuterol (VENTOLIN HFA) 108 (90 Base) MCG/ACT inhaler 2 puff  2 puff Inhalation Q4H PRN Antonieta Pert, MD       alum & mag hydroxide-simeth (MAALOX/MYLANTA) 200-200-20 MG/5ML suspension 30 mL  30 mL Oral Q4H PRN Antonieta Pert, MD   30 mL at 02/23/21 1736   amLODipine (NORVASC) tablet 5 mg  5 mg Oral Daily Antonieta Pert, MD   5 mg at 02/25/21  7829   atorvastatin (LIPITOR) tablet 40 mg  40 mg Oral Daily Antonieta Pert, MD   40 mg at 02/25/21 5621   carbamazepine (TEGRETOL) chewable tablet 200 mg  200 mg Oral BID Antonieta Pert, MD   200 mg at 02/25/21 3086   cloNIDine (CATAPRES) tablet 0.1 mg  0.1 mg Oral TID PRN Antonieta Pert, MD       diclofenac Sodium (VOLTAREN) 1 % topical gel 4 g  4 g Topical QID Antonieta Pert, MD   4 g at 02/24/21 1705   feeding supplement (ENSURE ENLIVE / ENSURE PLUS) liquid 237 mL  237 mL Oral BID BM Antonieta Pert, MD   237 mL at 02/25/21 1050   fluticasone (FLONASE) 50 MCG/ACT nasal spray 1 spray  1 spray Each Nare Daily Bobbitt, Shalon E, NP   1 spray at 02/25/21 1047   folic acid (FOLVITE) tablet 1 mg  1 mg Oral Daily Princess Bruins, DO   1 mg at 02/25/21 5784   gabapentin (NEURONTIN) capsule 100 mg  100 mg Oral TID Princess Bruins, DO       hydrOXYzine (ATARAX/VISTARIL) tablet 25 mg  25 mg Oral TID PRN Antonieta Pert, MD   25 mg at 02/24/21 2004   isosorbide mononitrate (IMDUR) 24 hr tablet 75 mg  75 mg Oral Daily Antonieta Pert, MD   75 mg at 02/25/21 0943   LORazepam (ATIVAN) tablet 1 mg  1 mg Oral Q6H PRN Antonieta Pert, MD   1 mg at 02/25/21 0136   magnesium hydroxide (MILK OF MAGNESIA) suspension 30 mL  30 mL Oral Daily PRN Antonieta Pert, MD   30 mL at 02/22/21 1848   meloxicam (MOBIC) tablet 15 mg  15 mg Oral  Daily Antonieta Pert, MD   15 mg at 02/25/21 0943   methocarbamol (ROBAXIN) tablet 750 mg  750 mg Oral Q8H PRN Antonieta Pert, MD   750 mg at 02/24/21 1138   metoprolol tartrate (LOPRESSOR) tablet 25 mg  25 mg Oral BID Antonieta Pert, MD   25 mg at 02/25/21 0942   pantoprazole (PROTONIX) EC tablet 40 mg  40 mg Oral q morning Antonieta Pert, MD   40 mg at 02/25/21 1047   risperiDONE (RISPERDAL M-TABS) disintegrating tablet 2 mg  2 mg Oral Q8H PRN Antonieta Pert, MD       And   ziprasidone (GEODON) injection 20 mg  20 mg Intramuscular Q12H PRN Antonieta Pert, MD       thiamine tablet 100 mg  100 mg Oral Daily Princess Bruins, DO   100 mg at 02/25/21 6962   traZODone (DESYREL) tablet 300 mg  300 mg Oral QHS PRN Princess Bruins, DO       Vitamin D (Ergocalciferol) (DRISDOL) capsule 50,000 Units  50,000 Units Oral Q7 days Antonieta Pert, MD   50,000 Units at 02/22/21 1256    Lab Results: No results found for this or any previous visit (from the past 48 hour(s)).  Blood Alcohol level:  Lab Results  Component Value Date   ETH 119 (H) 02/21/2021   ETH 47 (H) 01/25/2020    Metabolic Disorder Labs: Lab Results  Component Value Date   HGBA1C 4.8 02/21/2021   MPG 91.06 02/21/2021   MPG 76.71 08/26/2018   No results found for: PROLACTIN Lab Results  Component Value Date   CHOL 214 (H) 02/21/2021   TRIG 135 02/21/2021  HDL 58 02/21/2021   CHOLHDL 3.7 02/21/2021   VLDL 27 02/21/2021   LDLCALC 129 (H) 02/21/2021   LDLCALC 121 (H) 09/30/2017    Physical Findings: AIMS:  , ,  ,  ,    CIWA:  CIWA-Ar Total: 2 COWS:     Musculoskeletal: Strength & Muscle Tone: within normal limits Gait & Station: normal Patient leans: N/A  Psychiatric Specialty Exam:  Presentation  General Appearance: Casual; Appropriate for Environment  Eye Contact:Fair  Speech:Clear and Coherent  Speech Volume:Normal  Handedness:Right   Mood and Affect   Mood:Depressed  Affect:Congruent   Thought Process  Thought Processes:Coherent  Descriptions of Associations:Intact  Orientation:Full (Time, Place and Person)  Thought Content:Logical  History of Schizophrenia/Schizoaffective disorder:Yes  Duration of Psychotic Symptoms:No data recorded Hallucinations:No data recorded Ideas of Reference:None  Suicidal Thoughts:No data recorded Homicidal Thoughts:No data recorded  Sensorium  Memory:Immediate Good; Remote Good; Recent Good  Judgment:Fair  Insight:Fair   Executive Functions  Concentration:Good  Attention Span:Good  Recall:Good  Fund of Knowledge:Good  Language:Good   Psychomotor Activity  Psychomotor Activity: No data recorded  Assets  Assets:Communication Skills; Desire for Improvement; Housing; Resilience   Sleep  Sleep: No data recorded   Physical Exam: Physical Exam Constitutional:      Appearance: Normal appearance.  HENT:     Head: Normocephalic.     Nose: No congestion or rhinorrhea.  Eyes:     Conjunctiva/sclera: Conjunctivae normal.  Pulmonary:     Effort: Pulmonary effort is normal.  Neurological:     Mental Status: She is alert.   Review of Systems  Constitutional:  Negative for chills.  HENT:  Negative for congestion.   Respiratory:  Negative for cough and shortness of breath.   Cardiovascular:  Negative for chest pain.  Gastrointestinal:  Positive for diarrhea. Negative for heartburn and nausea.  Musculoskeletal:  Positive for back pain.  Neurological:  Negative for speech change and headaches.  Psychiatric/Behavioral:  Positive for depression, hallucinations and suicidal ideas. The patient is nervous/anxious and has insomnia.   Blood pressure 102/64, pulse 62, temperature 98 F (36.7 C), temperature source Oral, resp. rate 18, height 5\' 7"  (1.702 m), weight 87.1 kg, SpO2 100 %. Body mass index is 30.07 kg/m.   Treatment Plan Summary: Daily contact with patient to assess  and evaluate symptoms and progress in treatment  , [phone] F who was brought to [facility] (02/21/2021) by [facility] police for SI with a plan. Past psychiatric history of bipolar disorder, cocaine use disorder vs cocaine dependence, alcohol use disorder vs alcohol dependence.   Past psychiatric hospitalization: [facility] (11/16/2014 - 11/21/2014) for SI without a plan Past suicide attempts: reported 7 attempts over past 6 months in chart review   VS stable today Na 141 (02/21/2021) TSH 1.868 (02/21/2021) QTc 498 (02/21/2021) AST/ALT 34/34 (02/21/2021)   On chart review PMHx: - Cardiac arrest with PEA (08/25/2020) - Left heart cath for NSTEMI (08/30/2020)  Today (02/25/2021) patient's mood was irritable this morning. She was sitting up in bed reading a rehab facility brochure in [place]. Patient is upset that we are not able to send her to [place], [place]. During the interview, patient continue to report SI and AVH as mentioned in H&P. Also stated that her depression has not improved, but I have noticed more interactions with other patients during meals and social time. Also stated that she has not been sleeping well, but she has been sleeping about 5hrs a night, which is  an increase from her initial night.   Ordered repeat EKG, CBC, tegretol blood level, CMP  DEPRESSION - Discontinue Wellbutrin 150mg   Due to hx alcohol abuse and seizure risk with this medication - Add Cymbalta 30mg  daily  Helps with depression and pain  MOOD STABILITY - Continue Carbamazepine chewable tablet 200mg  PO 2 times daily (will check Tegretol level, CBC, and LFTs tomorrow) - Adding Neurontin 100mg  tid for mood stability,alcohol cravings, and neuropathic pain  PSYCHOSIS - patient requests to resume Haldol and will start 5mg  bid (r/b/se/a to med discussed and she consents to med trial) - rechecking EKG for QTC monitoring with start of Haldol   INSOMNIA - Discontinue  Doxepin 100mg  - Add trazodone 300mg  qHS PRN insomnia - checking EKG for QTC monitoring with use of this medication and concomitant Haldol  ALCOHOL USE D/O AND STIMULANT USE D/O - COCAINE TYPE - Add neurontin 100mg  TID  For seizure ppx and decreases EtOH craving - Continue CIWA protocol monitoring with Ativan 1mg  for CIWA score >10 with MVI and thiamine oral replacement - SW looking into residential rehab options  ANXIETY - Continue hydroxyzine tablet 25mg  PO 3 times daily PRN for anxiety  BACK PAIN - Continue Robaxin 750mg  PO 3 times daily as needed for pain - Continue acetaminophen tablet 650mg  q6HR PRN - Increase Mobic to 15mg  PO daily for osteoarthritic pain - Continue diclofenac sodium 1% topical gel 4 times daily for pain - Continue 2x lidoderm 5% patches daily for pain - Starting Neurontin 100mg  tid for neuropathic pain and Cymbalta 30mg  daily for mood and pain  - Radiographs of Lumbar spine and sacrum are negative for acute fractures. Thoracic spine shows minor disc degenerative changes, no fractures or acute findings   NUTRITION - Continue Vitamin D Drisdol 50,000 units PO q7 days second dose today for nutritional supplementation - Continue feeding supplement (ENSURE) PO 2 times daily between meals - Continue Folic acid and thiamine supplementation for MCV 101 and history of chronic alcohol abuse   CHEST PAIN - Continue Imdur 75mg  24hour tablet PO daily for chest pain   HYPERTENSION - Continue amlodipine 5mg  PO daily for hypertension and chest pain - Continue clonidine 0.1mg  PO 3 times daily as needed for systolic blood pressure greater than 160 - Continue Lopressor 25mg  PO twice daily for health rate and hypertension   HYPERLIPIDEMIA - Continue Lipitor 40mg  PO daily for hyperlipidemia   GERD - Continue alum & mag hydroxide-simeth 30ml PO every 4hrs PRN   CONSTIPATION - Continue magnesium hydroxide 30ml PO daily for mild constipation   ASTHMA - Continue albuterol  as needed for wheezing and shortness of breath   NASAL CONGESTION - Continue fluticasone 5650mcg/act nasal spray daily   AGITATION - Continue Risperdal agitation protocol as needed - Continue Geodon agitation protocol as needed  Princess BruinsJulie Nguyen, DO 02/25/2021, 4:18 PM

## 2021-02-25 NOTE — Progress Notes (Deleted)
EKG results placed on the outside of shadow chart  Normal Sinus Rhythm Prolonged QT  QT/QTC= 474/500 ms

## 2021-02-25 NOTE — Progress Notes (Signed)
   02/25/21 2335  Psych Admission Type (Psych Patients Only)  Admission Status Voluntary  Psychosocial Assessment  Patient Complaints Anxiety  Eye Contact Fair  Facial Expression Anxious  Affect Appropriate to circumstance  Speech Logical/coherent  Interaction Guarded  Motor Activity Slow  Appearance/Hygiene Unremarkable  Behavior Characteristics Cooperative  Mood Anxious  Thought Process  Coherency WDL  Content WDL  Delusions None reported or observed  Perception WDL  Hallucination None reported or observed  Judgment Impaired  Confusion None  Danger to Self  Current suicidal ideation? Denies  Self-Injurious Behavior No self-injurious ideation or behavior indicators observed or expressed   Agreement Not to Harm Self No  Description of Agreement verbal agreement to contract for safety  Danger to Others  Danger to Others None reported or observed

## 2021-02-25 NOTE — Progress Notes (Signed)
NUTRITION ASSESSMENT  Pt identified as at risk on the Malnutrition Screen Tool  INTERVENTION: 1. Supplements: Ensure Enlive po BID, each supplement provides 350 kcal and 20 grams of protein   NUTRITION DIAGNOSIS: Unintentional weight loss related to sub-optimal intake as evidenced by pt report.   Goal: Pt to meet >/= 90% of their estimated nutrition needs.  Monitor:  PO intake  Assessment:  Pt admitted for SI. Pt with history of substance abuse (cocaine, alcohol). Pt with reported fair appetite. Per weight records, pt has lost 14 lbs since 01/25/20 (6% wt loss x >1 year, insignificant for time frame). Ensure supplements have been ordered.  Height: Ht Readings from Last 1 Encounters:  02/21/21 5\' 7"  (1.702 m)    Weight: Wt Readings from Last 1 Encounters:  02/21/21 87.1 kg    Weight Hx: Wt Readings from Last 10 Encounters:  02/21/21 87.1 kg  01/25/20 93.9 kg  05/02/19 97.3 kg  02/23/19 98.9 kg  01/30/19 99.3 kg  10/25/18 101.5 kg  09/21/18 98.5 kg  09/13/18 100.2 kg  08/30/18 101.4 kg  07/25/18 101.6 kg    BMI:  Body mass index is 30.07 kg/m. Pt meets criteria for obesity based on current BMI.  Estimated Nutritional Needs: Kcal: 25-30 kcal/kg Protein: > 1 gram protein/kg Fluid: 1 ml/kcal  Diet Order:  Diet Order             Diet regular Room service appropriate? Yes; Fluid consistency: Thin  Diet effective now                  Pt is also offered choice of unit snacks mid-morning and mid-afternoon.  Pt is eating as desired.   Lab results and medications reviewed.   14/02/19, MS, RD, LDN Inpatient Clinical Dietitian Contact information available via Amion

## 2021-02-25 NOTE — [facility] Counselor (Signed)
Pt has decided that she does not want any referrals at this time unless it is to [facility].    Fredirick Lathe, LCSWA Clinicial Social Worker [employer]

## 2021-02-25 NOTE — Progress Notes (Signed)
The focus of this group is to help patients review their daily goal of treatment and discuss progress on daily workbooks. Pt did not attend the evening group. 

## 2021-02-25 NOTE — Progress Notes (Signed)
Patient did not attend morning orientation group.  

## 2021-02-26 MED ORDER — DULOXETINE HCL 60 MG PO CPEP
60.0000 mg | ORAL_CAPSULE | Freq: Every day | ORAL | Status: DC
  Administered 2021-02-27 – 2021-03-04 (×6): 60 mg via ORAL
  Filled 2021-02-26 (×8): qty 1

## 2021-02-26 NOTE — [facility] Group Notes (Signed)
Type of Therapy and Topic:  Group Therapy:  Setting Goals   Participation Level:  Active   Description of Group: In this process group, patients discussed using strengths to work toward goals and address challenges.  Patients identified two positive things about themselves and one goal they were working on.  Patients were given the opportunity to share openly and support each other's plan for self-empowerment.  The group discussed the value of gratitude and were encouraged to have a daily reflection of positive characteristics or circumstances.  Patients were encouraged to identify a plan to utilize their strengths to work on current challenges and goals.   Therapeutic Goals Patient will verbalize personal strengths/positive qualities and relate how these can assist with achieving desired personal goals Patients will verbalize affirmation of peers plans for personal change and goal setting Patients will explore the value of gratitude and positive focus as related to successful achievement of goals Patients will verbalize a plan for regular reinforcement of personal positive qualities and circumstances.   Summary of Patient Progress: Pt participated in the group session appropriately.   Delecia Vastine, LCSWA Clinicial Social Worker [facility]  

## 2021-02-26 NOTE — Plan of Care (Signed)
Nurse discussed anxiety, depression and coping skills with patient.  

## 2021-02-26 NOTE — [facility] Counselor (Signed)
CSW learned that pt has been accepted to [facility] and projection for admission is Monday. CSW will follow up tomorrow to confirm transportation coordination.   Fredirick Lathe, LCSWA Clinicial Social Worker [employer]

## 2021-02-26 NOTE — Progress Notes (Signed)
   02/26/21 2218  Psych Admission Type (Psych Patients Only)  Admission Status Voluntary  Psychosocial Assessment  Patient Complaints Anxiety  Eye Contact Fair  Facial Expression Anxious  Affect Appropriate to circumstance  Speech Logical/coherent  Interaction Guarded  Motor Activity Slow  Appearance/Hygiene Unremarkable  Behavior Characteristics Appropriate to situation;Cooperative  Mood Depressed  Thought Process  Coherency WDL  Content WDL  Delusions None reported or observed  Perception WDL  Hallucination Auditory;Visual  Judgment Impaired  Confusion None  Danger to Self  Current suicidal ideation? Passive  Self-Injurious Behavior No self-injurious ideation or behavior indicators observed or expressed   Agreement Not to Harm Self No  Description of Agreement verbal agreement to contract for safety  Danger to Others  Danger to Others None reported or observed  D: Patient reports she had a better day and was able to attend group.   A: Medications administered as prescribed. Support and encouragement provided as needed.  R: Patient remains safe on the unit. Will continue to monitor for safety and stability.

## 2021-02-26 NOTE — Progress Notes (Addendum)
D:  Patient denied HI this morning.  Has felt suicidal today, no plan, contracts for safety.  Patient also said she saw her deceased mother during the night and talked with her mother. A:  Medications administered per MD orders.  Emotional support and encouragement given patient. R:   Safety maintained with 15 minute checks. Patient has been strongly encouraged by staff today to get out of bed, attend groups, talk to peers, which patient has done.

## 2021-02-26 NOTE — Progress Notes (Signed)
Recreation Therapy Notes  Date:  7.6.22 Time: 0930 Location: [address] Dayroom  Group Topic: Stress Management  Goal Area(s) Addresses:  Patient will identify positive stress management techniques. Patient will identify benefits of using stress management post d/c.  Behavioral Response: Attentive  Intervention: Stress Management  Activity:  Meditation.  LRT played a meditation that focused on forgiving others to release yourself of the burden of carrying any anger, frustration or hurt caused by the person who wronged them.    Education:  Stress Management, Discharge Planning.   Education Outcome: Acknowledges Education  Clinical Observations/Feedback: Pt attended and participated in stress management activity. Pt expressed no concerns.     Danis Pembleton, LRT/CTRS         Beatriz Settles A 02/26/2021 11:34 AM 

## 2021-02-26 NOTE — Progress Notes (Signed)
PT Cancellation Note  Patient Details Name: Amanda [name] MRN: [id number] DOB: 11/15/1965   Cancelled Treatment:    Reason Eval/Treat Not Completed: Other (comment)PT will assess patient for back pain on 02/27/21.   Rada Hay 02/26/2021, 1:56 PM  Blanchard Kelch PT Acute Rehabilitation Services Pager [phone] Office [phone]

## 2021-02-26 NOTE — Progress Notes (Addendum)
[facility] MD Progress Note  02/26/2021 3:17 PM [name]  MRN:  [id number] Subjective:  Amanda [name], [phone] F who was brought to [facility] (02/21/2021) by [facility] police for SI with a plan. Past psychiatric history of bipolar disorder, cocaine use disorder vs cocaine dependence, alcohol use disorder vs alcohol dependence.  Past psychiatric hospitalization: [facility] (11/16/2014 - 11/21/2014) for SI without a plan Past suicide attempts: reported 7 attempts over past 6 months in chart review  SCH: Tegretol 200mg , cymbalta DR 30mg , gabapentin 100mg , haldol 5mg  BID  Last night, patient refused scheduled: Tegretol 200mg , Voltaren gel, and gabapentin. Patient did not want lidocaine patch PRN.  Today patient was sitting in bed and is irritated. Denied HI. Patient was irritated but compliant with interview. Stated that she still has SI with no plan, stated that she is still depressed, stated that she still has AVH where she heard mothers voice and sees unspecific shadows. When prompted to further explain AVH, she was vague. Patient did not participate in group activities yesterday (02/25/2021). Patient stated that the reason she refused the PRN for pain was because "it doesn't work, why would I take it". She stated that she wanted tramadol. Patient stated that her appetite has improved. Denied alcohol and cocaine withdrawal symptoms.   Principal Problem: Bipolar I disorder, most recent episode depressed, severe w psychosis (HCC) Diagnosis: Principal Problem:   Bipolar I disorder, most recent episode depressed, severe w psychosis (HCC) Active Problems:   Cocaine abuse (HCC)   Alcohol abuse  Total Time spent with patient: 15 minutes  Past Psychiatric History: Past psychiatric history of bipolar disorder, cocaine use disorder vs cocaine dependence, alcohol use disorder vs alcohol dependence.   Past psychiatric hospitalization: [facility] (11/16/2014 - 11/21/2014) Past suicide  attempts: 7 attempts over past 6 months   Patient stated that she attends [facility] for medical and pain, psychiatric management.   Past psych meds: Welbutrin, hydroxyzine, haldol, xanax  Past Medical History:  Past Medical History:  Diagnosis Date   Anxiety    Arthritis    lower back   Asthma    Bipolar disorder (HCC)    Carpal tunnel syndrome, bilateral    Chronic back pain    Depression    Diverticulosis    Fx. left wrist    GERD (gastroesophageal reflux disease)    Heart murmur    "born with"   Heart murmur    HLD (hyperlipidemia)    Hypertension    IBS (irritable bowel syndrome)    Internal hemorrhoids    Muscle spasms of neck    Neuropathy    PUD (peptic ulcer disease)     Past Surgical History:  Procedure Laterality Date   ARTHRODESIS METATARSALPHALANGEAL JOINT (MTPJ) Right 04/29/2016   Procedure: RIGHT GREAT TOE METATARSOPHALANGEAL JOINT (MTPJ) FUSION, HARDWARE REMOVAL;  Surgeon: 04/28/2021, MD;  Location: [facility];  Service: Orthopedics;  Laterality: Right;  RIGHT GREAT TOE METATARSOPHALANGEAL JOINT (MTPJ) FUSION, HARDWARE REMOVAL   CARPAL TUNNEL RELEASE Right 2003   CHOLECYSTECTOMY N/A 02/09/2014   Procedure: LAPAROSCOPIC CHOLECYSTECTOMY WITH INTRAOPERATIVE CHOLANGIOGRAM;  Surgeon: [facility], MD;  Location: [facility];  Service: General;  Laterality: N/A;   CHOLECYSTECTOMY     FOOT SURGERY     HAMMER TOE FUSION Bilateral 2008   HARDWARE REMOVAL Right 04/29/2016   Procedure: HARDWARE REMOVAL;  Surgeon: 2004, MD;  Location: [facility];  Service: Orthopedics;  Laterality: Right;  HARDWARE  REMOVAL   LEFT HEART CATH AND CORONARY ANGIOGRAPHY N/A 08/30/2018   Procedure: LEFT HEART CATH AND CORONARY ANGIOGRAPHY;  Surgeon: Marykay Lex, MD;  Location: [facility] INVASIVE CV LAB;  Service: Cardiovascular;  Laterality: N/A;   ROTATOR CUFF REPAIR Right 2011   WRIST FRACTURE SURGERY Left    Family History:  Family History  Problem  Relation Age of Onset   Pancreatic cancer Mother    CAD Father    Diabetes Mellitus II Father    Heart failure Father    Diabetes Father    Breast cancer Paternal Grandmother    HIV/AIDS Brother    Other Sister        Brain tumor   Colon cancer Neg Hx    Family Psychiatric  History: Patient denied any family psychiatric history, suicide, or substance abuse. Social History:  Social History   Substance and Sexual Activity  Alcohol Use Yes   Comment: heavily daily     Social History   Substance and Sexual Activity  Drug Use Yes   Types: Cocaine, "Crack" cocaine, Marijuana   Comment: last used two years ago    Social History   Socioeconomic History   Marital status: Single    Spouse name: Not on file   Number of children: 0   Years of education: 12   Highest education level: Not on file  Occupational History   Occupation: Cook    Comment: [employer]  Tobacco Use   Smoking status: Light Smoker    Pack years: 0.00    Types: Cigarettes   Smokeless tobacco: Never   Tobacco comments:    "takes a puff sometimes"  Substance and Sexual Activity   Alcohol use: Yes    Comment: heavily daily   Drug use: Yes    Types: Cocaine, "Crack" cocaine, Marijuana    Comment: last used two years ago   Sexual activity: Yes    Partners: Female  Other Topics Concern   Not on file  Social History Narrative   ** Merged History Encounter **       Pt lives at home alone. She has a HS education level and does not have any children.  She drinks 2 cups of caffeine daily.   Social Determinants of Health   Financial Resource Strain: Not on file  Food Insecurity: Not on file  Transportation Needs: Not on file  Physical Activity: Not on file  Stress: Not on file  Social Connections: Not on file   Additional Social History:                         Sleep: Fair  Appetite:  Good  Current Medications: Current Facility-Administered Medications  Medication Dose Route Frequency  Provider Last Rate Last Admin   acetaminophen (TYLENOL) tablet 650 mg  650 mg Oral Q6H PRN Antonieta Pert, MD   650 mg at 02/25/21 1316   albuterol (VENTOLIN HFA) 108 (90 Base) MCG/ACT inhaler 2 puff  2 puff Inhalation Q4H PRN Antonieta Pert, MD       alum & mag hydroxide-simeth (MAALOX/MYLANTA) 200-200-20 MG/5ML suspension 30 mL  30 mL Oral Q4H PRN Antonieta Pert, MD   30 mL at 02/23/21 1736   amLODipine (NORVASC) tablet 5 mg  5 mg Oral Daily Antonieta Pert, MD   5 mg at 02/26/21 0805   atorvastatin (LIPITOR) tablet 40 mg  40 mg Oral Daily Antonieta Pert, MD   40 mg  at 02/26/21 0805   carbamazepine (TEGRETOL) chewable tablet 200 mg  200 mg Oral BID Antonieta Pertlary, Greg Lawson, MD   200 mg at 02/26/21 [id number]   cloNIDine (CATAPRES) tablet 0.1 mg  0.1 mg Oral TID PRN Antonieta Pertlary, Greg Lawson, MD       diclofenac Sodium (VOLTAREN) 1 % topical gel 4 g  4 g Topical QID Antonieta Pertlary, Greg Lawson, MD   4 g at 02/24/21 1705   DULoxetine (CYMBALTA) DR capsule 30 mg  30 mg Oral Daily Mason JimSingleton, Amanda Birkhead E, MD   30 mg at 02/26/21 0806   feeding supplement (ENSURE ENLIVE / ENSURE PLUS) liquid 237 mL  237 mL Oral BID BM Antonieta Pertlary, Greg Lawson, MD   237 mL at 02/26/21 1100   fluticasone (FLONASE) 50 MCG/ACT nasal spray 1 spray  1 spray Each Nare Daily Bobbitt, Shalon E, NP   1 spray at 02/26/21 0806   folic acid (FOLVITE) tablet 1 mg  1 mg Oral Daily Princess BruinsNguyen, Julie, DO   1 mg at 02/26/21 [id number]   gabapentin (NEURONTIN) capsule 100 mg  100 mg Oral TID Princess BruinsNguyen, Julie, DO   100 mg at 02/26/21 1210   haloperidol (HALDOL) tablet 5 mg  5 mg Oral BID Bartholomew CrewsSingleton, Precilla Purnell E, MD   5 mg at 02/26/21 [id number]   hydrOXYzine (ATARAX/VISTARIL) tablet 50 mg  50 mg Oral TID PRN Comer LocketSingleton, Wynell Halberg E, MD       isosorbide mononitrate (IMDUR) 24 hr tablet 75 mg  75 mg Oral Daily Antonieta Pertlary, Greg Lawson, MD   75 mg at 02/26/21 0805   lidocaine (LIDODERM) 5 % 1 patch  1 patch Transdermal Daily PRN Comer LocketSingleton, Monique Hefty E, MD       LORazepam (ATIVAN) tablet 1 mg  1 mg Oral Q6H  PRN Antonieta Pertlary, Greg Lawson, MD   1 mg at 02/25/21 0136   magnesium hydroxide (MILK OF MAGNESIA) suspension 30 mL  30 mL Oral Daily PRN Antonieta Pertlary, Greg Lawson, MD   30 mL at 02/25/21 2319   meloxicam (MOBIC) tablet 15 mg  15 mg Oral Daily Antonieta Pertlary, Greg Lawson, MD   15 mg at 02/26/21 0805   methocarbamol (ROBAXIN) tablet 750 mg  750 mg Oral Q8H PRN Antonieta Pertlary, Greg Lawson, MD   750 mg at 02/24/21 1138   metoprolol tartrate (LOPRESSOR) tablet 25 mg  25 mg Oral BID Antonieta Pertlary, Greg Lawson, MD   25 mg at 02/26/21 0808   pantoprazole (PROTONIX) EC tablet 40 mg  40 mg Oral q morning Antonieta Pertlary, Greg Lawson, MD   40 mg at 02/26/21 1018   risperiDONE (RISPERDAL M-TABS) disintegrating tablet 2 mg  2 mg Oral Q8H PRN Antonieta Pertlary, Greg Lawson, MD       And   ziprasidone (GEODON) injection 20 mg  20 mg Intramuscular Q12H PRN Antonieta Pertlary, Greg Lawson, MD       thiamine tablet 100 mg  100 mg Oral Daily Princess BruinsNguyen, Julie, DO   100 mg at 02/26/21 [id number]   traZODone (DESYREL) tablet 300 mg  300 mg Oral QHS PRN Princess BruinsNguyen, Julie, DO   300 mg at 02/25/21 2319   Vitamin D (Ergocalciferol) (DRISDOL) capsule 50,000 Units  50,000 Units Oral Q7 days Antonieta Pertlary, Greg Lawson, MD   50,000 Units at 02/22/21 1256    Lab Results: No results found for this or any previous visit (from the past 48 hour(s)).  Blood Alcohol level:  Lab Results  Component Value Date   ETH 119 (H) 02/21/2021   ETH 47 (H) 01/25/2020  Metabolic Disorder Labs: Lab Results  Component Value Date   HGBA1C 4.8 02/21/2021   MPG 91.06 02/21/2021   MPG 76.71 08/26/2018   No results found for: PROLACTIN Lab Results  Component Value Date   CHOL 214 (H) 02/21/2021   TRIG 135 02/21/2021   HDL 58 02/21/2021   CHOLHDL 3.7 02/21/2021   VLDL 27 02/21/2021   LDLCALC 129 (H) 02/21/2021   LDLCALC 121 (H) 09/30/2017    Physical Findings: AIMS:  , ,  ,  ,    CIWA:  CIWA-Ar Total: 1 COWS:     Musculoskeletal: Strength & Muscle Tone: within normal limits Gait & Station: normal Patient leans:  N/A  Psychiatric Specialty Exam:  Presentation  General Appearance: Casual; Appropriate for Environment  Eye Contact:Fair  Speech:Clear and Coherent  Speech Volume:Normal  Handedness:Right   Mood and Affect  Mood:Depressed  Affect:Congruent   Thought Process  Thought Processes:Coherent  Descriptions of Associations:Intact  Orientation:Full (Time, Place and Person)  Thought Content:Logical  History of Schizophrenia/Schizoaffective disorder:Yes  Duration of Psychotic Symptoms:No data recorded Hallucinations:Reports AH of hearing mother's voice and VH of vague shadows Ideas of Reference:None  Suicidal Thoughts:Passive SI without intent or plan and contracts for safety Homicidal Thoughts:Denied  Sensorium  Memory:Immediate Good; Remote Good; Recent Good  Judgment:Fair  Insight:Fair   Executive Functions  Concentration:Good  Attention Span:Good  Recall:Good  Fund of Knowledge:Good  Language:Good   Psychomotor Activity  Psychomotor Activity: Normal, no tremors  Assets  Assets:Communication Skills; Desire for Improvement; Housing; Resilience   Sleep  Sleep: 5 hours    Physical Exam: Physical Exam Constitutional:      Appearance: She is obese.  HENT:     Head: Normocephalic.     Nose: Nose normal.  Eyes:     Conjunctiva/sclera: Conjunctivae normal.  Pulmonary:     Effort: Pulmonary effort is normal.  Neurological:     Mental Status: She is alert.   Review of Systems  Constitutional:  Negative for chills and fever.  HENT:  Negative for congestion and sore throat.   Respiratory:  Negative for cough and shortness of breath.   Gastrointestinal:  Negative for abdominal pain, nausea and vomiting.  Neurological:  Negative for dizziness and headaches.  Blood pressure (!) 146/80, pulse 69, temperature 98 F (36.7 C), temperature source Oral, resp. rate 16, height  (1.702 m), weight 87.1 kg, SpO2 100 %. Body mass index is 30.07  kg/m.   Treatment Plan Summary: Daily contact with patient to assess and evaluate symptoms and progress in treatment  Amanda [name], [phone] F who was brought to [facility] (02/21/2021) by [facility] police for SI with a plan. Past psychiatric history of bipolar disorder, cocaine use disorder vs cocaine dependence, alcohol use disorder vs alcohol dependence.  Past psychiatric hospitalization: [facility] (11/16/2014 - 11/21/2014) for SI without a plan Past suicide attempts: reported 7 attempts over past 6 months in chart review  On chart review PMHx: - Cardiac arrest with PEA (08/25/2020) - Left heart cath for NSTEMI (08/30/2020  QTc 434 (02/26/2021) TSH 1.868 (02/21/2021) HbA1c 4.8 (02/21/2021) AST/ALT 34/34 (02/21/2021)  Today patient was sitting in bed and irritable similar to yesterday. Denied HI. Patient continued to report that she still has SI without a plan, is severely depressed, and in pain. Stating again that if she leaves now without long term placement that she "will not make it". When brought up the topic about facility placement, she stated she needed something longer than  one month and that does not require to her work. Stated again that she is not able to work because of her back pain, and that she is disabled. However, last night (02/25/2021) she has denied the scheduled voltaren gel, gabapentin for pain. And has not utilized the lidocaine patch or the methocarbamol PRN last night. Rather patient requested for tramadol.  We encouraged her again to participate in group activities, rather than laying in bed all day in the dark as she has been doing. She had an interview with [employer] today, and they accepted her. Per social work, projection for admission is Monday (03/03/2021).  Pending Tegretol level, CBC, CMP  BIPOLAR - Cont Cymbalta 30mg  daily             Helps with depression and pain - Cont Carbamazepine chewable tablet  200mg  PO 2 times daily  - Cont Neurontin 100mg  tid for mood stability,alcohol cravings, and neuropathic pain - Cont haldol 5mg  BID for reported psychosis - titrating up as needed based on previous home dose - Cont trazodone 300mg  qHS PRN insomnia   ALCOHOL USE D/O AND STIMULANT USE D/O - COCAINE TYPE - Cont neurontin 100mg  TID             For seizure ppx and decreases EtOH craving - Cont CIWA protocol monitoring with Ativan 1mg  for CIWA score >10 with MVI and thiamine oral replacement   ANXIETY - Cont hydroxyzine tablet 50mg  PO 3 times daily PRN for anxiety   BACK PAIN - Cont Robaxin 750mg  PO 3 times daily as needed for pain - Cont acetaminophen tablet 650mg  q6HR PRN - Cont Mobic 15mg  PO daily for osteoarthritic pain - Cont diclofenac sodium 1% topical gel 4 times daily for pain - Cont lidoderm 5% patches daily for pain - Cont Neurontin 100mg  tid for neuropathic pain and Cymbalta 30mg  daily for mood and pain  - Radiographs of Lumbar spine and sacrum are negative for acute fractures. Thoracic spine shows minor disc degenerative changes, no fractures or acute findings   NUTRITION - Cont Vitamin D Drisdol 50,000 units PO q7 days second dose today for nutritional supplementation - Cont feeding supplement (ENSURE) PO 2 times daily between meals - Cont Folic acid and thiamine supplementation for MCV 101 and history of chronic alcohol abuse   CHEST PAIN - Cont Imdur 75mg  24hour tablet PO daily for chest pain   HYPERTENSION - Cont amlodipine 5mg  PO daily for hypertension and chest pain - Cont clonidine 0.1mg  PO 3 times daily as needed for systolic blood pressure greater than 160 - Cont ntinue Lopressor 25mg  PO twice daily for health rate and hypertension   HYPERLIPIDEMIA - Cont Lipitor 40mg  PO daily for hyperlipidemia   GERD - Cont alum & mag hydroxide-simeth 68ml PO every 4hrs PRN   CONSTIPATION - Cont magnesium hydroxide 52ml PO daily for mild constipation   ASTHMA - Cont  albuterol as needed for wheezing and shortness of breath   NASAL CONGESTION - Cont fluticasone 74mcg/act nasal spray daily   AGITATION - Cont Risperdal agitation protocol as needed - Cont Geodon agitation protocol as needed  , DO 02/26/2021, 3:17 PM

## 2021-02-27 LAB — CBC WITH DIFFERENTIAL/PLATELET
Abs Immature Granulocytes: 0.01 10*3/uL (ref 0.00–0.07)
Basophils Absolute: 0.1 10*3/uL (ref 0.0–0.1)
Basophils Relative: 1 %
Eosinophils Absolute: 0.2 10*3/uL (ref 0.0–0.5)
Eosinophils Relative: 5 %
HCT: 41.1 % (ref 36.0–46.0)
Hemoglobin: 13.2 g/dL (ref 12.0–15.0)
Immature Granulocytes: 0 %
Lymphocytes Relative: 50 %
Lymphs Abs: 2.1 10*3/uL (ref 0.7–4.0)
MCH: 33.3 pg (ref 26.0–34.0)
MCHC: 32.1 g/dL (ref 30.0–36.0)
MCV: 103.8 fL — ABNORMAL HIGH (ref 80.0–100.0)
Monocytes Absolute: 0.4 10*3/uL (ref 0.1–1.0)
Monocytes Relative: 9 %
Neutro Abs: 1.5 10*3/uL — ABNORMAL LOW (ref 1.7–7.7)
Neutrophils Relative %: 35 %
Platelets: 146 10*3/uL — ABNORMAL LOW (ref 150–400)
RBC: 3.96 MIL/uL (ref 3.87–5.11)
RDW: 13.3 % (ref 11.5–15.5)
WBC: 4.2 10*3/uL (ref 4.0–10.5)
nRBC: 0 % (ref 0.0–0.2)

## 2021-02-27 LAB — COMPREHENSIVE METABOLIC PANEL
ALT: 48 U/L — ABNORMAL HIGH (ref 0–44)
AST: 35 U/L (ref 15–41)
Albumin: 4.2 g/dL (ref 3.5–5.0)
Alkaline Phosphatase: 75 U/L (ref 38–126)
Anion gap: 6 (ref 5–15)
BUN: 15 mg/dL (ref 6–20)
CO2: 30 mmol/L (ref 22–32)
Calcium: 9.1 mg/dL (ref 8.9–10.3)
Chloride: 104 mmol/L (ref 98–111)
Creatinine, Ser: 0.97 mg/dL (ref 0.44–1.00)
GFR, Estimated: >60 mL/min (ref >60)
Glucose, Bld: 98 mg/dL (ref 70–99)
Potassium: 4.1 mmol/L (ref 3.5–5.1)
Sodium: 140 mmol/L (ref 135–145)
Total Bilirubin: 0.5 mg/dL (ref 0.3–1.2)
Total Protein: 6.9 g/dL (ref 6.5–8.1)

## 2021-02-27 LAB — URINALYSIS, COMPLETE (UACMP) WITH MICROSCOPIC
Bilirubin Urine: NEGATIVE
Glucose, UA: NEGATIVE mg/dL
Hgb urine dipstick: NEGATIVE
Ketones, ur: 5 mg/dL — AB
Nitrite: NEGATIVE
Protein, ur: 30 mg/dL — AB
Specific Gravity, Urine: 1.029 (ref 1.005–1.030)
pH: 5 (ref 5.0–8.0)

## 2021-02-27 LAB — CARBAMAZEPINE LEVEL, TOTAL: Carbamazepine Lvl: 6.6 ug/mL (ref 4.0–12.0)

## 2021-02-27 MED ORDER — DOCUSATE SODIUM 100 MG PO CAPS
100.0000 mg | ORAL_CAPSULE | Freq: Every day | ORAL | Status: DC
  Administered 2021-02-27 – 2021-03-04 (×6): 100 mg via ORAL
  Filled 2021-02-27 (×9): qty 1

## 2021-02-27 MED ORDER — DOCUSATE SODIUM 50 MG/5ML PO LIQD
50.0000 mg | Freq: Every day | ORAL | Status: DC | PRN
  Filled 2021-02-27: qty 10

## 2021-02-27 MED ORDER — MAGNESIUM HYDROXIDE 400 MG/5ML PO SUSP
30.0000 mL | Freq: Every day | ORAL | Status: DC | PRN
Start: 2021-02-27 — End: 2021-03-04
  Administered 2021-02-28 – 2021-03-03 (×2): 30 mL via ORAL
  Filled 2021-02-27 (×2): qty 30

## 2021-02-27 MED ORDER — CARBAMAZEPINE 100 MG PO CHEW
100.0000 mg | CHEWABLE_TABLET | Freq: Two times a day (BID) | ORAL | Status: DC
  Administered 2021-02-28 (×2): 100 mg via ORAL
  Filled 2021-02-27 (×5): qty 1

## 2021-02-27 MED ORDER — CARBAMAZEPINE 100 MG PO CHEW
100.0000 mg | CHEWABLE_TABLET | Freq: Two times a day (BID) | ORAL | Status: DC
  Filled 2021-02-27 (×2): qty 1

## 2021-02-27 MED ORDER — PSYLLIUM 95 % PO PACK
1.0000 | PACK | Freq: Every day | ORAL | Status: DC
  Administered 2021-02-27: 1 via ORAL
  Filled 2021-02-27 (×8): qty 1

## 2021-02-27 NOTE — Progress Notes (Signed)
   02/27/21 2121  Psych Admission Type (Psych Patients Only)  Admission Status Voluntary  Psychosocial Assessment  Patient Complaints Anxiety  Eye Contact Fair  Facial Expression Animated;Anxious  Affect Anxious;Appropriate to circumstance  Speech Logical/coherent  Interaction Assertive  Motor Activity Slow  Appearance/Hygiene Unremarkable  Behavior Characteristics Cooperative;Appropriate to situation  Mood Anxious;Pleasant  Thought Process  Coherency WDL  Content WDL  Delusions None reported or observed  Perception WDL  Hallucination None reported or observed  Judgment Poor  Confusion None  Danger to Self  Current suicidal ideation? Denies  Self-Injurious Behavior No self-injurious ideation or behavior indicators observed or expressed   Danger to Others  Danger to Others None reported or observed

## 2021-02-27 NOTE — Plan of Care (Signed)
  Problem: Education: Goal: Knowledge of [place] [facility] Education information/materials will improve Outcome: Progressing Goal: Emotional status will improve Outcome: Progressing Goal: Verbalization of understanding the information provided will improve Outcome: Progressing   Problem: Activity: Goal: Interest or engagement in activities will improve Outcome: Progressing

## 2021-02-27 NOTE — Progress Notes (Signed)
Progress note  Pt found in bed; compliant with medication pass. Pt had complaints of anxiety. Pt did express complaints of urinary retention but a bladder scan found 0 mL to be retained. Pt was educated on bowel frequency. Pt felt they could benefit from supplementation. Pt has been reclusive to their room but pleasant. Pt denies si/hi/ah/vh and verbally agrees to approach staff if these become apparent or before harming themselves/others while at [facility].  A: Pt provided support and encouragement. Pt given medication per protocol and standing orders. Q35m safety checks implemented and continued.  R: Pt safe on the unit. Will continue to monitor.

## 2021-02-27 NOTE — Progress Notes (Addendum)
[facility] MD Progress Note  02/27/2021 2:49 PM Amanda [name]  MRN:  [id number] Subjective:  Amanda [name], [phone] F who was brought to [facility] (02/21/2021) by [facility] police for SI with a plan. Past psychiatric history of bipolar disorder, cocaine use disorder vs cocaine dependence, alcohol use disorder vs alcohol dependence.  Today patient was cheerful and cooperative with interview. Denied SI/HI and auditory hallucinations. Admits to visual hallucinations, reported seeing amorphic shadows.  Stated that she slept well last night and that she is excited about the acceptance into [employer]. Stated that she still feels depressed, but that she is starting to have hope in herself.  Patient brought up new concerns in regards to 1) voiding, 2) bowel movements, 3) brief hand tremor, and 4) low back pain and bilateral neuropathy.  1) Patient stated that she has not been able to void since yesterday night (02/26/2021). Stated that this has happened "a few times before", but could not specify frequency. Stated that each time the issue self resolves with no intervention. Stated "I am just able to go". Denied frequency, urgency, pain. Admitted to feelings of bladder fullness. During interview, encourage patient to void. Patient stated that she was able to void only a little bit. Ordered a bladder scan right after, that resulted in 39mL in the bladder.  2) Patient stated that she has not had a bowel movement in 2 days. Upon further questioning, patient stated that she typically only has 2 bowel movements a week, and that is normal for her.   3) Patient stated that she had a very brief episode of right hand tremor this morning when she woke up. During the interview this morning there was no hand tremor. Performed AIMS = 0.  4) Patient stated that she still has low back pain, and now lower limb neuropathy. When asked patient why she refused scheduled gabapentin TID  for neuropathy, she stated that she doesn't like it. Stated that it gave her diarrhea.  Revisited the patient later that morning after bladder scan result. Patient stated that she just had a bowel movement, which relieved feeling of bladder fullness. She denied abdominal pain, headache, dizziness, N/V, tremors.  Principal Problem: Bipolar I disorder, most recent episode depressed, severe w psychosis (HCC) Diagnosis: Principal Problem:   Bipolar I disorder, most recent episode depressed, severe w psychosis (HCC) Active Problems:   Cocaine abuse (HCC)   Alcohol abuse  Total Time spent with patient: 25 minutes  Past Psychiatric History:  Past psychiatric history of bipolar disorder, cocaine use disorder vs cocaine dependence, alcohol use disorder vs alcohol dependence.   Past psychiatric hospitalization: [facility] (11/16/2014 - 11/21/2014) for SI without a plan Past suicide attempts: reported 7 attempts over past 6 months in chart review  Past Medical History:  Past Medical History:  Diagnosis Date   Anxiety    Arthritis    lower back   Asthma    Bipolar disorder (HCC)    Carpal tunnel syndrome, bilateral    Chronic back pain    Depression    Diverticulosis    Fx. left wrist    GERD (gastroesophageal reflux disease)    Heart murmur    "born with"   Heart murmur    HLD (hyperlipidemia)    Hypertension    IBS (irritable bowel syndrome)    Internal hemorrhoids    Muscle spasms of neck    Neuropathy    PUD (peptic ulcer disease)  Past Surgical History:  Procedure Laterality Date   ARTHRODESIS METATARSALPHALANGEAL JOINT (MTPJ) Right 04/29/2016   Procedure: RIGHT GREAT TOE METATARSOPHALANGEAL JOINT (MTPJ) FUSION, HARDWARE REMOVAL;  Surgeon: Tarry Kos, MD;  Location: [facility];  Service: Orthopedics;  Laterality: Right;  RIGHT GREAT TOE METATARSOPHALANGEAL JOINT (MTPJ) FUSION, HARDWARE REMOVAL   CARPAL TUNNEL RELEASE Right 2003   CHOLECYSTECTOMY N/A 02/09/2014    Procedure: LAPAROSCOPIC CHOLECYSTECTOMY WITH INTRAOPERATIVE CHOLANGIOGRAM;  Surgeon: Valarie Merino, MD;  Location: [facility];  Service: General;  Laterality: N/A;   CHOLECYSTECTOMY     FOOT SURGERY     HAMMER TOE FUSION Bilateral 2008   HARDWARE REMOVAL Right 04/29/2016   Procedure: HARDWARE REMOVAL;  Surgeon: Tarry Kos, MD;  Location: [facility];  Service: Orthopedics;  Laterality: Right;  HARDWARE REMOVAL   LEFT HEART CATH AND CORONARY ANGIOGRAPHY N/A 08/30/2018   Procedure: LEFT HEART CATH AND CORONARY ANGIOGRAPHY;  Surgeon: Marykay Lex, MD;  Location: [facility] INVASIVE CV LAB;  Service: Cardiovascular;  Laterality: N/A;   ROTATOR CUFF REPAIR Right 2011   WRIST FRACTURE SURGERY Left    Family History:  Family History  Problem Relation Age of Onset   Pancreatic cancer Mother    CAD Father    Diabetes Mellitus II Father    Heart failure Father    Diabetes Father    Breast cancer Paternal Grandmother    HIV/AIDS Brother    Other Sister        Brain tumor   Colon cancer Neg Hx    Family Psychiatric  History: Patient denied any family psychiatric history, suicide, or substance abuse. Social History:  Social History   Substance and Sexual Activity  Alcohol Use Yes   Comment: heavily daily     Social History   Substance and Sexual Activity  Drug Use Yes   Types: Cocaine, "Crack" cocaine, Marijuana   Comment: last used two years ago    Social History   Socioeconomic History   Marital status: Single    Spouse name: Not on file   Number of children: 0   Years of education: 12   Highest education level: Not on file  Occupational History   Occupation: Cook    Comment: [employer]  Tobacco Use   Smoking status: Light Smoker    Pack years: 0.00    Types: Cigarettes   Smokeless tobacco: Never   Tobacco comments:    "takes a puff sometimes"  Substance and Sexual Activity   Alcohol use: Yes    Comment: heavily daily   Drug use: Yes    Types: Cocaine, "Crack"  cocaine, Marijuana    Comment: last used two years ago   Sexual activity: Yes    Partners: Female  Other Topics Concern   Not on file  Social History Narrative   ** Merged History Encounter **       Pt lives at home alone. She has a HS education level and does not have any children.  She drinks 2 cups of caffeine daily.   Social Determinants of Health   Financial Resource Strain: Not on file  Food Insecurity: Not on file  Transportation Needs: Not on file  Physical Activity: Not on file  Stress: Not on file  Social Connections: Not on file   Additional Social History:                         Sleep: Good  Appetite:  Good  Current Medications: Current Facility-Administered Medications  Medication Dose Route Frequency Provider Last Rate Last Admin   acetaminophen (TYLENOL) tablet 650 mg  650 mg Oral Q6H PRN Antonieta Pert, MD   650 mg at 02/26/21 2126   albuterol (VENTOLIN HFA) 108 (90 Base) MCG/ACT inhaler 2 puff  2 puff Inhalation Q4H PRN Antonieta Pert, MD   2 puff at 02/27/21 0754   alum & mag hydroxide-simeth (MAALOX/MYLANTA) 200-200-20 MG/5ML suspension 30 mL  30 mL Oral Q4H PRN Antonieta Pert, MD   30 mL at 02/23/21 1736   amLODipine (NORVASC) tablet 5 mg  5 mg Oral Daily Antonieta Pert, MD   5 mg at 02/27/21 0753   atorvastatin (LIPITOR) tablet 40 mg  40 mg Oral Daily Antonieta Pert, MD   40 mg at 02/27/21 0753   [START ON 02/28/2021] carbamazepine (TEGRETOL) chewable tablet 100 mg  100 mg Oral BID Princess Bruins, DO       cloNIDine (CATAPRES) tablet 0.1 mg  0.1 mg Oral TID PRN Antonieta Pert, MD       diclofenac Sodium (VOLTAREN) 1 % topical gel 4 g  4 g Topical QID Antonieta Pert, MD   4 g at 02/24/21 1705   docusate (COLACE) 50 MG/5ML liquid 50 mg  50 mg Oral Daily PRN Princess Bruins, DO       DULoxetine (CYMBALTA) DR capsule 60 mg  60 mg Oral Daily Mason Jim, Osamu Olguin E, MD   60 mg at 02/27/21 0752   feeding supplement (ENSURE ENLIVE /  ENSURE PLUS) liquid 237 mL  237 mL Oral BID BM Antonieta Pert, MD   237 mL at 02/26/21 1600   fluticasone (FLONASE) 50 MCG/ACT nasal spray 1 spray  1 spray Each Nare Daily Bobbitt, Shalon E, NP   1 spray at 02/27/21 0752   folic acid (FOLVITE) tablet 1 mg  1 mg Oral Daily Princess Bruins, DO   1 mg at 02/27/21 1610   gabapentin (NEURONTIN) capsule 100 mg  100 mg Oral TID Princess Bruins, DO   100 mg at 02/26/21 1736   haloperidol (HALDOL) tablet 5 mg  5 mg Oral BID Bartholomew Crews E, MD   5 mg at 02/27/21 0753   hydrOXYzine (ATARAX/VISTARIL) tablet 50 mg  50 mg Oral TID PRN Comer Locket, MD       isosorbide mononitrate (IMDUR) 24 hr tablet 75 mg  75 mg Oral Daily Antonieta Pert, MD   75 mg at 02/27/21 0752   lidocaine (LIDODERM) 5 % 1 patch  1 patch Transdermal Daily PRN Comer Locket, MD       LORazepam (ATIVAN) tablet 1 mg  1 mg Oral Q6H PRN Antonieta Pert, MD   1 mg at 02/25/21 0136   magnesium hydroxide (MILK OF MAGNESIA) suspension 30 mL  30 mL Oral Daily PRN Princess Bruins, DO       meloxicam (MOBIC) tablet 15 mg  15 mg Oral Daily Antonieta Pert, MD   15 mg at 02/27/21 0753   methocarbamol (ROBAXIN) tablet 750 mg  750 mg Oral Q8H PRN Antonieta Pert, MD   750 mg at 02/26/21 1813   metoprolol tartrate (LOPRESSOR) tablet 25 mg  25 mg Oral BID Antonieta Pert, MD   25 mg at 02/27/21 0753   pantoprazole (PROTONIX) EC tablet 40 mg  40 mg Oral q morning Antonieta Pert, MD   40 mg at 02/27/21 1241  psyllium (HYDROCIL/METAMUCIL) 1 packet  1 packet Oral Daily Princess Bruins, DO   1 packet at 02/27/21 1241   risperiDONE (RISPERDAL M-TABS) disintegrating tablet 2 mg  2 mg Oral Q8H PRN Antonieta Pert, MD       And   ziprasidone (GEODON) injection 20 mg  20 mg Intramuscular Q12H PRN Antonieta Pert, MD       thiamine tablet 100 mg  100 mg Oral Daily Princess Bruins, DO   100 mg at 02/27/21 0753   traZODone (DESYREL) tablet 300 mg  300 mg Oral QHS PRN Princess Bruins, DO    150 mg at 02/26/21 2126   Vitamin D (Ergocalciferol) (DRISDOL) capsule 50,000 Units  50,000 Units Oral Q7 days Antonieta Pert, MD   50,000 Units at 02/22/21 1256    Lab Results:  Results for orders placed or performed during the hospital encounter of 02/21/21 (from the past 48 hour(s))  Carbamazepine level, total     Status: None   Collection Time: 02/27/21  6:43 AM  Result Value Ref Range   Carbamazepine Lvl 6.6 4.0 - 12.0 ug/mL    Comment: Performed at [facility], [address], [place], [place] [address]  CBC with Differential/Platelet     Status: Abnormal   Collection Time: 02/27/21  6:43 AM  Result Value Ref Range   WBC 4.2 4.0 - 10.5 K/uL   RBC 3.96 3.87 - 5.11 MIL/uL   Hemoglobin 13.2 12.0 - 15.0 g/dL   HCT 79.0 24.0 - 97.3 %   MCV 103.8 (H) 80.0 - 100.0 fL   MCH 33.3 26.0 - 34.0 pg   MCHC 32.1 30.0 - 36.0 g/dL   RDW 53.2 99.2 - 42.6 %   Platelets 146 (L) 150 - 400 K/uL   nRBC 0.0 0.0 - 0.2 %   Neutrophils Relative % 35 %   Neutro Abs 1.5 (L) 1.7 - 7.7 K/uL   Lymphocytes Relative 50 %   Lymphs Abs 2.1 0.7 - 4.0 K/uL   Monocytes Relative 9 %   Monocytes Absolute 0.4 0.1 - 1.0 K/uL   Eosinophils Relative 5 %   Eosinophils Absolute 0.2 0.0 - 0.5 K/uL   Basophils Relative 1 %   Basophils Absolute 0.1 0.0 - 0.1 K/uL   Immature Granulocytes 0 %   Abs Immature Granulocytes 0.01 0.00 - 0.07 K/uL    Comment: Performed at [facility], [address], [place], [place] [address]  Comprehensive metabolic panel     Status: Abnormal   Collection Time: 02/27/21  6:43 AM  Result Value Ref Range   Sodium 140 135 - 145 mmol/L   Potassium 4.1 3.5 - 5.1 mmol/L   Chloride 104 98 - 111 mmol/L   CO2 30 22 - 32 mmol/L   Glucose, Bld 98 70 - 99 mg/dL    Comment: Glucose reference range applies only to samples taken after fasting for at least 8 hours.   BUN 15 6 - 20 mg/dL   Creatinine, Ser 6.22 0.44 - 1.00 mg/dL   Calcium 9.1 8.9 - 29.7 mg/dL   Total  Protein 6.9 6.5 - 8.1 g/dL   Albumin 4.2 3.5 - 5.0 g/dL   AST 35 15 - 41 U/L   ALT 48 (H) 0 - 44 U/L   Alkaline Phosphatase 75 38 - 126 U/L   Total Bilirubin 0.5 0.3 - 1.2 mg/dL   GFR, Estimated >98 >92 mL/min    Comment: (NOTE) Calculated using the  CKD-EPI Creatinine Equation (2021)    Anion gap 6 5 - 15    Comment: Performed at [facility], [address] [place], [place] [id number]    Blood Alcohol level:  Lab Results  Component Value Date   ETH 119 (H) 02/21/2021   ETH 47 (H) 01/25/2020    Metabolic Disorder Labs: Lab Results  Component Value Date   HGBA1C 4.8 02/21/2021   MPG 91.06 02/21/2021   MPG 76.71 08/26/2018   No results found for: PROLACTIN Lab Results  Component Value Date   CHOL 214 (H) 02/21/2021   TRIG 135 02/21/2021   HDL 58 02/21/2021   CHOLHDL 3.7 02/21/2021   VLDL 27 02/21/2021   LDLCALC 129 (H) 02/21/2021   LDLCALC 121 (H) 09/30/2017    Physical Findings: AIMS: Facial and Oral Movements Muscles of Facial Expression: None, normal Lips and Perioral Area: None, normal Jaw: None, normal Tongue: None, normal,Extremity Movements Upper (arms, wrists, hands, fingers): None, normal Lower (legs, knees, ankles, toes): None, normal, Trunk Movements Neck, shoulders, hips: None, normal, Overall Severity Severity of abnormal movements (highest score from questions above): None, normal Incapacitation due to abnormal movements: None, normal Patient's awareness of abnormal movements (rate only patient's report): No Awareness, Dental Status Current problems with teeth and/or dentures?: No Does patient usually wear dentures?: No  CIWA:  CIWA-Ar Total: 1 COWS:     Musculoskeletal: Strength & Muscle Tone: within normal limits Gait & Station: normal Patient leans: N/A  Psychiatric Specialty Exam: Physical Exam Constitutional:      Appearance: Normal appearance. She is obese.  HENT:     Head: Normocephalic.     Nose: Nose normal.   Eyes:     Conjunctiva/sclera: Conjunctivae normal.  Pulmonary:     Effort: Pulmonary effort is normal.  Neurological:     Mental Status: She is alert.    Review of Systems  Constitutional:  Positive for activity change (Improved, get out of bed and goes to group activites). Negative for appetite change and fatigue.  HENT:  Negative for congestion, rhinorrhea and sore throat.   Eyes:  Negative for visual disturbance.  Respiratory:  Negative for cough and shortness of breath.   Cardiovascular:  Negative for chest pain.  Gastrointestinal:  Positive for constipation (Endorsed chronic constipation and typically have 2 bowel movement a week). Negative for abdominal pain, nausea and vomiting.  Endocrine: Negative for cold intolerance and heat intolerance.  Genitourinary:  Positive for difficulty urinating (Reported that this is recurrent and has self-resolved in the past). Negative for dysuria, flank pain, urgency and vaginal pain.  Musculoskeletal:  Positive for back pain.  Neurological:  Negative for dizziness, tremors, seizures, light-headedness and headaches.  Psychiatric/Behavioral:  Positive for hallucinations. Negative for agitation, confusion, decreased concentration and suicidal ideas. The patient is not nervous/anxious and is not hyperactive.    Blood pressure 128/88, pulse 77, temperature 98 F (36.7 C), temperature source Oral, resp. rate 16, height 5\' 7"  (1.702 m), weight 87.1 kg, SpO2 98 %.Body mass index is 30.07 kg/m.  General Appearance: Casual and Fairly Groomed  Eye Contact:  Fair  Speech:  Clear and Coherent and Normal Rate  Volume:  Normal  Mood:  dysphoric  Affect:brighter appearing affect, less constricted  Thought Process:  Coherent and Linear  Orientation:  Full (Time, Place, and Person)  Thought Content:  Reports vague VH of shadow but denies AH, ideas of reference, paranoia, or first rank symptoms - no evidence of acute psychosis on exam  Suicidal Thoughts:  No   Homicidal Thoughts:  No  Memory:  Immediate;   Good Recent;   Good Remote;   Good  Judgement:  Fair  Insight:  Fair  Psychomotor Activity:  Normal - AIMS 0 and no tremor noted on exam, no cogwheeling or stiffness  Concentration:  Concentration: Good and Attention Span: Good  Recall:  Good  Fund of Knowledge:  Good  Language:  Good  Akathisia:  No  Handed:  Right  AIMS (if indicated):   0  Assets:  Communication Skills Desire for Improvement Housing Resilience  ADL's:  Intact  Cognition:  WNL  Sleep:  Number of Hours: 9.75    Treatment Plan Summary: Daily contact with patient to assess and evaluate symptoms and progress in treatment  Amanda [name], [phone] F who was brought to [facility] (02/21/2021) by [facility] police for SI with a plan. Past psychiatric history of bipolar disorder, cocaine use disorder vs cocaine dependence, alcohol use disorder vs alcohol dependence.    SCH: Cymbalta  daily, Haldol  BID, Tegretol 100 BID  Last night (02/27/2021), patient was compliant with most scheduled meds, except for gabapentin  TID and Voltaren gel.  Patient's sleep improved, getting 9.75hours last night.   Today patient's mood improved remarkably. Denied SI/HI and stated no auditory hallucinations today. Still endorsed visual hallucination. Patient's suicidalilty was conditional on finding a place to live.   Lab resulted today, and on review will need to trend CBC and LFTs because patient is on tegretol. (ANC 1500 with WBC 4.2, platelets 146, ALT 48) Changed Tegretol  PO BID to  PO BID because of voiding complaint, mild bump in ALT, and slight decrease in ANC and platelets.  Discussed with patient that her ANC and platelets could have also dropped secondary to Haldol use but she does not wish to discontinue Haldol stating it is a home medication that has worked well for her. Discussed that Haldol, Cymbalta, and Tegretol could all  potentially cause urinary retention but she declines stop of Haldol or Cymbalta at this time.  Informed patient on risk and benefit, and patient stated that she understood. So will continue and monitor. No other med changes for now.    BIPOLAR - Cont Cymbalta  daily - Changed Carbamazepine chewable tablet  PO 2 times daily to  PO BID and monitor for mood symptoms with dose reduction - rechecking CBC and CMP for trending of platelets, ANC, electrolytes, and LFT on Saturday - Cont haldol  BID (r/b/se/a to typical antipsychotic reviewed with patient and she requests to restart this as a home medication) - Cont trazodone  qHS PRN insomnia  ALCOHOL USE D/O AND STIMULANT USE D/O - COCAINE TYPE - Encouraged neurontin  TID - Cont CIWA protocol monitoring with Ativan PO   PRN for CIWA score >10 with MVI and thiamine oral replacement (recent CIWA scores 3,1) - Patient [facility] to [facility] Monday  BACK PAIN - Cont Robaxin  PO 3 times daily as needed for pain - Cont acetaminophen tablet  q6HR  - Cont Mobic  PO daily for osteoarthritic pain - Cont diclofenac sodium 1% topical gel 4 times daily for pain - Cont lidoderm 5% patches daily for pain - Cont Neurontin  tid for neuropathic pain and Cymbalta  daily for mood and pain   ANXIETY - Cont hydroxyzine tablet  PO 3 times daily PRN for anxiety   NUTRITION - Cont Vitamin D Drisdol 50,000 units PO  -  Cont feeding supplement (ENSURE) PO 2 times daily between meals - Cont Folic acid and thiamine   CHEST PAIN - Cont Imdur  24hour tablet PO daily for chest pain   HYPERTENSION - Cont amlodipine  PO daily for hypertension and chest pain - Cont clonidine 0.1mg  PO 3 times daily as needed for systolic blood pressure greater than 160 - Cont Lopressor  PO twice daily for health rate and hypertension   HYPERLIPIDEMIA - Cont Lipitor  PO daily for hyperlipidemia    GERD - Cont alum & mag hydroxide-simeth 30ml PO every 4hrs PRN - Cont protonix  daily  PERCEIVED URINARY RETENTION - Bladder scan negative for retention and symptoms improved after bowel movement - Reduced Tegretol to  bid at patient's request to see if perceived symptoms improve - UA pending   CONSTIPATION - Cont magnesium hydroxide 30ml PO daily for mild constipation - Start Colace  daily and Metamucil daily   ASTHMA - Cont albuterol as needed for wheezing and shortness of breath   NASAL CONGESTION - Cont fluticasone 35mcg/act nasal spray daily   AGITATION - Cont Risperdal agitation protocol as needed - Cont Geodon agitation protocol as needed    Princess Bruins, DO 02/27/2021, 2:49 PM

## 2021-02-27 NOTE — Evaluation (Signed)
Physical Therapy Evaluation Patient Details Name: Amanda [name] MRN: [id number] DOB: 05/10/1966 Today's Date: 02/27/2021   History of Present Illness  Amanda [name], [phone] F who was brought to [facility] (02/21/2021) by [facility] police for SI with a plan.  Past psychiatric history of bipolar disorder, cocaine use disorder vs cocaine dependence, alcohol use disorder vs alcohol dependence, chronic back pain  Clinical Impression  Patient reports" I was told  I have arthritis and it will not get any better." Patient provided written HEP for back stretch and strength.  Patient performed  supine exercises, declined trying prone or all fours back exercises stating" I cannot do that".  Patient reports that in past she has gone to [facility] 3 x's /week for her back and the exercises shown today are very similar. Patient encouraged to remain active with ambulatioin and make attempts to perform  exercises as tolerated. No further acute PT needs indicated. PT will sign off.   Copy of HEP in soft chart.  Follow Up Recommendations Outpatient PT    Equipment Recommendations  None recommended by PT    Recommendations for Other Services       Precautions / Restrictions Precautions Precaution Comments: psyche      Mobility  Bed Mobility Overal bed mobility: Independent                  Transfers Overall transfer level: Independent                  Ambulation/Gait Ambulation/Gait assistance: Independent           General Gait Details: in room, up on unit ad lib  Stairs            Wheelchair Mobility    Modified Rankin (Stroke Patients Only)       Balance                                             Pertinent Vitals/Pain Pain Assessment: 0-10 Pain Score: 7  Pain Location: back Pain Descriptors / Indicators: Aching Pain Intervention(s): Monitored during session    Home Living Family/patient expects to be  discharged to:: Private residence Living Arrangements: Alone   Type of Home: Apartment Home Access: Level entry     Home Layout: Two level Home Equipment: Cane - single point      Prior Function Level of Independence: Independent               Hand Dominance        Extremity/Trunk Assessment   Upper Extremity Assessment Upper Extremity Assessment: Overall WFL for tasks assessed    Lower Extremity Assessment Lower Extremity Assessment: Overall WFL for tasks assessed    Cervical / Trunk Assessment Cervical / Trunk Assessment: Other exceptions Cervical / Trunk Exceptions: decreased rotation to L/R,  decreased forward flexion  Communication   Communication: No difficulties  Cognition Arousal/Alertness: Awake/alert Behavior During Therapy: WFL for tasks assessed/performed Overall Cognitive Status: Within Functional Limits for tasks assessed                                        General Comments      Exercises Other Exercises Other Exercises: PRovided written bACK EXERCISE- COPY TO PATIENT AND IN SOFT  CHART.   Assessment/Plan    PT Assessment All further PT needs can be met in the next venue of care  PT Problem List Decreased range of motion;Pain       PT Treatment Interventions      PT Goals (Current goals can be found in the Care Plan section)  Acute Rehab PT Goals PT Goal Formulation: All assessment and education complete, DC therapy    Frequency     Barriers to discharge        Co-evaluation               AM-PAC PT "6 Clicks" Mobility  Outcome Measure Help needed turning from your back to your side while in a flat bed without using bedrails?: None Help needed moving from lying on your back to sitting on the side of a flat bed without using bedrails?: None Help needed moving to and from a bed to a chair (including a wheelchair)?: None Help needed standing up from a chair using your arms (e.g., wheelchair or bedside  chair)?: None Help needed to walk in hospital room?: None Help needed climbing 3-5 steps with a railing? : None 6 Click Score: 24    End of Session   Activity Tolerance: Patient tolerated treatment well Patient left: in bed Nurse Communication:  (hep PROVIDED)      Time: 1410-1446 PT Time Calculation (min) (ACUTE ONLY): 36 min   Charges:   PT Evaluation $PT Eval Low Complexity: 1 Low PT Treatments $Therapeutic Exercise: 8-22 mins        [place] Pager [phone] Office [phone]   Claretha Cooper 02/27/2021, 2:53 PM

## 2021-02-27 NOTE — [facility] Group Notes (Signed)
[facility] Group Notes:  (Nursing/MHT/Case Management/Adjunct)  Date:  02/27/2021  Time:  9:57 AM  Type of Therapy:  Goals group  Participation Level:  Did Not Attend  Participation Quality:    Affect:    Cognitive:    Insight:    Engagement in Group:    Modes of Intervention:  Discussion and Education  Summary of Progress/Problems:  Did not attend despite staff invitation.  Cassandre Oleksy V Mackenzy Eisenberg 02/27/2021, 9:57 AM 

## 2021-02-28 ENCOUNTER — Other Ambulatory Visit: Payer: Self-pay

## 2021-02-28 MED ORDER — DICLOFENAC SODIUM 1 % EX GEL: 4.0000 g | Freq: Four times a day (QID) | CUTANEOUS | Status: DC | PRN

## 2021-02-28 NOTE — Tx Team (Signed)
Interdisciplinary Treatment and Diagnostic Plan Update  02/28/2021 Time of Session: 10:00am Amanda [name] MRN: [id number]  Principal Diagnosis: Bipolar I disorder, most recent episode depressed, severe w psychosis (HCC)  Secondary Diagnoses: Principal Problem:   Bipolar I disorder, most recent episode depressed, severe w psychosis (HCC) Active Problems:   Cocaine abuse (HCC)   Alcohol abuse   Current Medications:  Current Facility-Administered Medications  Medication Dose Route Frequency Provider Last Rate Last Admin   acetaminophen (TYLENOL) tablet 650 mg  650 mg Oral Q6H PRN Antonieta Pert, MD   650 mg at 02/27/21 2121   albuterol (VENTOLIN HFA) 108 (90 Base) MCG/ACT inhaler 2 puff  2 puff Inhalation Q4H PRN Antonieta Pert, MD   2 puff at 02/27/21 0754   alum & mag hydroxide-simeth (MAALOX/MYLANTA) 200-200-20 MG/5ML suspension 30 mL  30 mL Oral Q4H PRN Antonieta Pert, MD   30 mL at 02/23/21 1736   amLODipine (NORVASC) tablet 5 mg  5 mg Oral Daily Antonieta Pert, MD   5 mg at 02/28/21 8315   atorvastatin (LIPITOR) tablet 40 mg  40 mg Oral Daily Antonieta Pert, MD   40 mg at 02/28/21 1761   carbamazepine (TEGRETOL) chewable tablet 100 mg  100 mg Oral BID Princess Bruins, DO   100 mg at 02/28/21 6073   cloNIDine (CATAPRES) tablet 0.1 mg  0.1 mg Oral TID PRN Antonieta Pert, MD       diclofenac Sodium (VOLTAREN) 1 % topical gel 4 g  4 g Topical QID Antonieta Pert, MD   4 g at 02/24/21 1705   docusate sodium (COLACE) capsule 100 mg  100 mg Oral Daily Mason Jim, Amy E, MD   100 mg at 02/28/21 0939   DULoxetine (CYMBALTA) DR capsule 60 mg  60 mg Oral Daily Bartholomew Crews E, MD   60 mg at 02/28/21 0939   feeding supplement (ENSURE ENLIVE / ENSURE PLUS) liquid 237 mL  237 mL Oral BID BM Antonieta Pert, MD   237 mL at 02/28/21 0944   fluticasone (FLONASE) 50 MCG/ACT nasal spray 1 spray  1 spray Each Nare Daily Bobbitt, Shalon E, NP   1 spray at 02/28/21 0940   folic  acid (FOLVITE) tablet 1 mg  1 mg Oral Daily Princess Bruins, DO   1 mg at 02/28/21 0940   gabapentin (NEURONTIN) capsule 100 mg  100 mg Oral TID Princess Bruins, DO   100 mg at 02/26/21 1736   haloperidol (HALDOL) tablet 5 mg  5 mg Oral BID Bartholomew Crews E, MD   5 mg at 02/28/21 0939   hydrOXYzine (ATARAX/VISTARIL) tablet 50 mg  50 mg Oral TID PRN Comer Locket, MD   50 mg at 02/27/21 2121   isosorbide mononitrate (IMDUR) 24 hr tablet 75 mg  75 mg Oral Daily Antonieta Pert, MD   75 mg at 02/28/21 0939   lidocaine (LIDODERM) 5 % 1 patch  1 patch Transdermal Daily PRN Comer Locket, MD       LORazepam (ATIVAN) tablet 1 mg  1 mg Oral Q6H PRN Antonieta Pert, MD   1 mg at 02/25/21 0136   magnesium hydroxide (MILK OF MAGNESIA) suspension 30 mL  30 mL Oral Daily PRN Princess Bruins, DO       meloxicam (MOBIC) tablet 15 mg  15 mg Oral Daily Antonieta Pert, MD   15 mg at 02/28/21 0939   methocarbamol (ROBAXIN) tablet 750 mg  750 mg Oral Q8H PRN Antonieta Pert, MD   750 mg at 02/27/21 1602   metoprolol tartrate (LOPRESSOR) tablet 25 mg  25 mg Oral BID Antonieta Pert, MD   25 mg at 02/28/21 0939   pantoprazole (PROTONIX) EC tablet 40 mg  40 mg Oral q morning Antonieta Pert, MD   40 mg at 02/28/21 0941   psyllium (HYDROCIL/METAMUCIL) 1 packet  1 packet Oral Daily Princess Bruins, DO   1 packet at 02/27/21 1241   risperiDONE (RISPERDAL M-TABS) disintegrating tablet 2 mg  2 mg Oral Q8H PRN Antonieta Pert, MD       And   ziprasidone (GEODON) injection 20 mg  20 mg Intramuscular Q12H PRN Antonieta Pert, MD       thiamine tablet 100 mg  100 mg Oral Daily Princess Bruins, DO   100 mg at 02/28/21 4166   traZODone (DESYREL) tablet 300 mg  300 mg Oral QHS PRN Princess Bruins, DO   300 mg at 02/27/21 2121   Vitamin D (Ergocalciferol) (DRISDOL) capsule 50,000 Units  50,000 Units Oral Q7 days Antonieta Pert, MD   50,000 Units at 02/22/21 1256   PTA Medications: Medications Prior to  Admission  Medication Sig Dispense Refill Last Dose   albuterol (VENTOLIN HFA) 108 (90 Base) MCG/ACT inhaler Inhale 2 puffs into the lungs every 4 (four) hours as needed for wheezing or shortness of breath.      amLODipine (NORVASC) 5 MG tablet Take 1 tablet (5 mg total) by mouth daily. 30 tablet 6    atorvastatin (LIPITOR) 40 MG tablet Take 1 tablet (40 mg total) by mouth daily. 30 tablet 6    buPROPion (WELLBUTRIN XL) 300 MG 24 hr tablet Take 1 tablet (300 mg total) by mouth every morning.      hydrOXYzine (ATARAX/VISTARIL) 25 MG tablet Take 1 tablet (25 mg total) by mouth every 6 (six) hours as needed for anxiety. 30 tablet 0    isosorbide mononitrate (IMDUR) 60 MG 24 hr tablet Take 60 mg by mouth daily.      isosorbide mononitrate (IMDUR) 60 MG 24 hr tablet Take 1 tablet (60 mg total) by mouth daily.      metoprolol tartrate (LOPRESSOR) 50 MG tablet Take 1 tablet (50 mg total) by mouth 2 (two) times daily.      pantoprazole (PROTONIX) 40 MG tablet Take 40 mg by mouth every morning.      traZODone (DESYREL) 300 MG tablet Take 1 tablet (300 mg total) by mouth at bedtime. 30 tablet 0     Patient Stressors: Substance abuse Other: living situation  Patient Strengths: Capable of independent living Barrister's clerk for treatment/growth Supportive family/friends  Treatment Modalities: Medication Management, Group therapy, Case management,  1 to 1 session with clinician, Psychoeducation, Recreational therapy.   Physician Treatment Plan for Primary Diagnosis: Bipolar I disorder, most recent episode depressed, severe w psychosis (HCC) Long Term Goal(s): Improvement in symptoms so as ready for discharge   Short Term Goals: Ability to identify changes in lifestyle to reduce recurrence of condition will improve Ability to verbalize feelings will improve Ability to disclose and discuss suicidal ideas Ability to demonstrate self-control will improve Ability to identify and develop  effective coping behaviors will improve Ability to maintain clinical measurements within normal limits will improve Compliance with prescribed medications will improve Ability to identify triggers associated with substance abuse/mental health issues will improve  Medication Management: Evaluate patient's response, side effects, and  tolerance of medication regimen.  Therapeutic Interventions: 1 to 1 sessions, Unit Group sessions and Medication administration.  Evaluation of Outcomes: Progressing  Physician Treatment Plan for Secondary Diagnosis: Principal Problem:   Bipolar I disorder, most recent episode depressed, severe w psychosis (HCC) Active Problems:   Cocaine abuse (HCC)   Alcohol abuse  Long Term Goal(s): Improvement in symptoms so as ready for discharge   Short Term Goals: Ability to identify changes in lifestyle to reduce recurrence of condition will improve Ability to verbalize feelings will improve Ability to disclose and discuss suicidal ideas Ability to demonstrate self-control will improve Ability to identify and develop effective coping behaviors will improve Ability to maintain clinical measurements within normal limits will improve Compliance with prescribed medications will improve Ability to identify triggers associated with substance abuse/mental health issues will improve     Medication Management: Evaluate patient's response, side effects, and tolerance of medication regimen.  Therapeutic Interventions: 1 to 1 sessions, Unit Group sessions and Medication administration.  Evaluation of Outcomes: Progressing   RN Treatment Plan for Primary Diagnosis: Bipolar I disorder, most recent episode depressed, severe w psychosis (HCC) Long Term Goal(s):   medication stabilization, elimination of SI thoughts, development of comprehensive mental wellness plan.    Short Term Goals: Ability to verbalize frustration and anger appropriately will improve, Ability to  participate in decision making will improve, Ability to verbalize feelings will improve, Ability to disclose and discuss suicidal ideas, and Ability to identify and develop effective coping behaviors will improve  Medication Management: RN will administer medications as ordered by provider, will assess and evaluate patient's response and provide education to patient for prescribed medication. RN will report any adverse and/or side effects to prescribing provider.  Therapeutic Interventions: 1 on 1 counseling sessions, Psychoeducation, Medication administration, Evaluate responses to treatment, Monitor vital signs and CBGs as ordered, Perform/monitor CIWA, COWS, AIMS and Fall Risk screenings as ordered, Perform wound care treatments as ordered.  Evaluation of Outcomes: Progressing   LCSW Treatment Plan for Primary Diagnosis: Bipolar I disorder, most recent episode depressed, severe w psychosis (HCC) Long Term Goal(s): Safe transition to appropriate next level of care at discharge, Engage patient in therapeutic group addressing interpersonal concerns.  Short Term Goals: Engage patient in aftercare planning with referrals and resources, Increase ability to appropriately verbalize feelings, Increase emotional regulation, and Increase skills for wellness and recovery  Therapeutic Interventions: Assess for all discharge needs, 1 to 1 time with Social worker, Explore available resources and support systems, Assess for adequacy in community support network, Educate family and significant other(s) on suicide prevention, Complete Psychosocial Assessment, Interpersonal group therapy.  Evaluation of Outcomes: Progressing   Progress in Treatment: Attending groups: No. Participating in groups: No. Taking medication as prescribed: Yes. Toleration medication: Yes. Family/Significant other contact made: No, will contact:  unable to reach pt's sister Patient understands diagnosis: Yes. Discussing patient  identified problems/goals with staff: Yes. Medical problems stabilized or resolved: Yes. Denies suicidal/homicidal ideation: Yes. Issues/concerns per patient self-inventory: No. Other: n/a  New problem(s) identified: No, Describe:  none reported  New Short Term/Long Term Goal(s):   medication stabilization, elimination of SI thoughts, development of comprehensive mental wellness plan.    Patient Goals:  Patient reports that she wants to get into long term psychiatric care.   Discharge Plan or Barriers: Patient [facility] to [facility]  Reason for Continuation of Hospitalization: Anxiety Depression Medication stabilization  Estimated Length of Stay: 3-5 days  Attendees: Patient:  Did not attend 02/28/2021   Physician:  02/28/2021   Nursing:  02/28/2021   RN Care Manager: 02/28/2021   Social Worker: Ruthann Cancer, LCSW 02/28/2021   Recreational Therapist:  02/28/2021   Other:  02/28/2021   Other:  02/28/2021   Other: 02/28/2021     Scribe for Treatment Team: Otelia Santee, LCSW 02/28/2021 10:08 AM

## 2021-02-28 NOTE — Progress Notes (Signed)
     02/28/21 2124  Psych Admission Type (Psych Patients Only)  Admission Status Voluntary  Psychosocial Assessment  Patient Complaints Anxiety;Depression  Eye Contact Fair  Facial Expression Animated;Anxious  Affect Anxious  Speech Logical/coherent  Interaction Assertive  Motor Activity Slow  Appearance/Hygiene Unremarkable  Behavior Characteristics Cooperative;Appropriate to situation;Anxious  Mood Anxious;Pleasant  Thought Process  Coherency WDL  Content WDL  Delusions None reported or observed  Perception WDL  Hallucination None reported or observed  Judgment Poor  Confusion None  Danger to Self  Current suicidal ideation? Denies  Self-Injurious Behavior No self-injurious ideation or behavior indicators observed or expressed   Danger to Others  Danger to Others None reported or observed

## 2021-02-28 NOTE — Progress Notes (Signed)
Progress note  Pt found in bed; compliant with medication administration. Pt has complaints of increasing anxiety and depression. Pt confessed to still being isolative and guarded with others. Pt also states they are sleeping more than may be needed. Planning was made to be seen more in the milieu and to meet others. Pt was educated on proper activity/rest balance. Pt voiced understanding. Pt is pleasant. Pt denied gabapentin and voltarin stating these don't work. Pt denies si/hi/ah/vh and verbally agrees to approach staff if these become apparent or before harming themselves/others while at [facility].  A: Pt provided support and encouragement. Pt given medication per protocol and standing orders. Q35m safety checks implemented and continued.  R: Pt safe on the unit. Will continue to monitor.

## 2021-02-28 NOTE — Plan of Care (Signed)
  Problem: Activity: Goal: Sleeping patterns will improve Outcome: Progressing   Problem: Coping: Goal: Ability to demonstrate self-control will improve Outcome: Progressing   Problem: Health Behavior/Discharge Planning: Goal: Identification of resources available to assist in meeting health care needs will improve Outcome: Progressing

## 2021-02-28 NOTE — Progress Notes (Signed)
Recreation Therapy Notes  Date: 7.8.22 Time: 0930 Location: 300 Hall Dayroom  Group Topic: Stress Management   Goal Area(s) Addresses:  Patient will actively participate in stress management techniques presented during session.  Patient will successfully identify benefit of practicing stress management post d/c.   Intervention: Guided exercise with ambient sound and script  Activity :Guided Imagery  LRT provided education, instruction, and demonstration on practice of visualization via guided imagery. Patient was asked to participate in the technique introduced during session. LRT also debriefed including topics of mindfulness, stress management and specific scenarios each patient could use these techniques. Patients were given suggestions of ways to access scripts post d/c and encouraged to explore Youtube and other apps available on smartphones, tablets, and computers.   Education:  Stress Management, Discharge Planning.   Education Outcome: Acknowledges education  Clinical Observations/Feedback:  Group did not occur due to goals group going over time.     Jannelle Notaro, LRT/CTRS         Sadrac Zeoli A 02/28/2021 10:42 AM 

## 2021-02-28 NOTE — Progress Notes (Addendum)
[facility] MD Progress Note  02/28/2021 3:46 PM Amanda [name]  MRN:  [id number] Subjective:  Amanda [name], [phone] F who was brought to [facility] (02/21/2021) by [facility] police for SI with a plan. Past psychiatric history of bipolar disorder, cocaine use disorder vs cocaine dependence, alcohol use disorder vs alcohol dependence. [facility] stay day 7.   Last night:  Patient slept Number of Hours: 6.75, decrease from yesterday, but patient stated that it was restful Patient did attend PM group activities. Patient was mostly compliant with scheduled meds, still refusing gabapentin and voltaren gel.  Today (02/28/2021): Vital signs are within normal limits. Patient was laying in bed calm and cooperative with interview. Patient denied SI/HI/ visual hallucinations. Admitted to hearing music in her head last night, that it was not from the hallway. Also reported to feeling depressed, but that it is much improved. Stated that she no longer has feelings of urinary retention. Stated she had no issues with bowel movements and voiding. Denied urinary urgency, urinary frequency, dysuria. Patient endorses back and shin pain that is dull, intermittent, and tender to palpation. Patient refused gabapentin, voltaren gel, and lidocaine patches. Stated that she wanted tramadol.  Patient denied tremor, headache, dizziness, N/V, shortness of breath, abdominal pain.   Principal Problem: Bipolar I disorder, most recent episode depressed, severe w psychosis (HCC) Diagnosis: Principal Problem:   Bipolar I disorder, most recent episode depressed, severe w psychosis (HCC) Active Problems:   Cocaine abuse (HCC)   Alcohol abuse  Total Time spent with patient: 25 minutes  Past Psychiatric History:  Past psychiatric history of bipolar disorder, cocaine use disorder vs cocaine dependence, alcohol use disorder vs alcohol dependence.   Past psychiatric hospitalization: [facility] (11/16/2014 - 11/21/2014) for  SI without a plan Past suicide attempts: reported 7 attempts over past 6 months in chart review  Past Medical History:  Past Medical History:  Diagnosis Date   Anxiety    Arthritis    lower back   Asthma    Bipolar disorder (HCC)    Carpal tunnel syndrome, bilateral    Chronic back pain    Depression    Diverticulosis    Fx. left wrist    GERD (gastroesophageal reflux disease)    Heart murmur    "born with"   Heart murmur    HLD (hyperlipidemia)    Hypertension    IBS (irritable bowel syndrome)    Internal hemorrhoids    Muscle spasms of neck    Neuropathy    PUD (peptic ulcer disease)     Past Surgical History:  Procedure Laterality Date   ARTHRODESIS METATARSALPHALANGEAL JOINT (MTPJ) Right 04/29/2016   Procedure: RIGHT GREAT TOE METATARSOPHALANGEAL JOINT (MTPJ) FUSION, HARDWARE REMOVAL;  Surgeon: Tarry Kos, MD;  Location: [facility];  Service: Orthopedics;  Laterality: Right;  RIGHT GREAT TOE METATARSOPHALANGEAL JOINT (MTPJ) FUSION, HARDWARE REMOVAL   CARPAL TUNNEL RELEASE Right 2003   CHOLECYSTECTOMY N/A 02/09/2014   Procedure: LAPAROSCOPIC CHOLECYSTECTOMY WITH INTRAOPERATIVE CHOLANGIOGRAM;  Surgeon: Valarie Merino, MD;  Location: [facility];  Service: General;  Laterality: N/A;   CHOLECYSTECTOMY     FOOT SURGERY     HAMMER TOE FUSION Bilateral 2008   HARDWARE REMOVAL Right 04/29/2016   Procedure: HARDWARE REMOVAL;  Surgeon: Tarry Kos, MD;  Location:  SURGERY CENTER;  Service: Orthopedics;  Laterality: Right;  HARDWARE REMOVAL   LEFT HEART CATH AND CORONARY ANGIOGRAPHY N/A 08/30/2018   Procedure: LEFT HEART CATH AND  CORONARY ANGIOGRAPHY;  Surgeon: Marykay Lex, MD;  Location: [facility] CV [facility];  Service: Cardiovascular;  Laterality: N/A;   ROTATOR CUFF REPAIR Right 2011   WRIST FRACTURE SURGERY Left    Family History:  Family History  Problem Relation Age of Onset   Pancreatic cancer Mother    CAD Father    Diabetes Mellitus II Father     Heart failure Father    Diabetes Father    Breast cancer Paternal Grandmother    HIV/AIDS Brother    Other Sister        Brain tumor   Colon cancer Neg Hx    Family Psychiatric  History: Patient denied any family psychiatric history, suicide, or substance abuse. Social History:  Social History   Substance and Sexual Activity  Alcohol Use Yes   Comment: heavily daily     Social History   Substance and Sexual Activity  Drug Use Yes   Types: Cocaine, "Crack" cocaine, Marijuana   Comment: last used two years ago    Social History   Socioeconomic History   Marital status: Single    Spouse name: Not on file   Number of children: 0   Years of education: 12   Highest education level: Not on file  Occupational History   Occupation: Cook    Comment: [employer]  Tobacco Use   Smoking status: Light Smoker    Pack years: 0.00    Types: Cigarettes   Smokeless tobacco: Never   Tobacco comments:    "takes a puff sometimes"  Substance and Sexual Activity   Alcohol use: Yes    Comment: heavily daily   Drug use: Yes    Types: Cocaine, "Crack" cocaine, Marijuana    Comment: last used two years ago   Sexual activity: Yes    Partners: Female  Other Topics Concern   Not on file  Social History Narrative   ** Merged History Encounter **       Pt lives at home alone. She has a HS education level and does not have any children.  She drinks 2 cups of caffeine daily.   Social Determinants of Health   Financial Resource Strain: Not on file  Food Insecurity: Not on file  Transportation Needs: Not on file  Physical Activity: Not on file  Stress: Not on file  Social Connections: Not on file   Additional Social History:                         Sleep: Good  Appetite:  Good  Current Medications: Current Facility-Administered Medications  Medication Dose Route Frequency Provider Last Rate Last Admin   acetaminophen (TYLENOL) tablet 650 mg  650 mg Oral Q6H PRN Antonieta Pert, MD   650 mg at 02/27/21 2121   albuterol (VENTOLIN HFA) 108 (90 Base) MCG/ACT inhaler 2 puff  2 puff Inhalation Q4H PRN Antonieta Pert, MD   2 puff at 02/27/21 0754   alum & mag hydroxide-simeth (MAALOX/MYLANTA) 200-200-20 MG/5ML suspension 30 mL  30 mL Oral Q4H PRN Antonieta Pert, MD   30 mL at 02/23/21 1736   amLODipine (NORVASC) tablet 5 mg  5 mg Oral Daily Antonieta Pert, MD   5 mg at 02/28/21 0939   atorvastatin (LIPITOR) tablet 40 mg  40 mg Oral Daily Antonieta Pert, MD   40 mg at 02/28/21 0939   carbamazepine (TEGRETOL) chewable tablet 100 mg  100 mg Oral  BID Princess Bruins, DO   100 mg at 02/28/21 4268   cloNIDine (CATAPRES) tablet 0.1 mg  0.1 mg Oral TID PRN Antonieta Pert, MD       diclofenac Sodium (VOLTAREN) 1 % topical gel 4 g  4 g Topical QID PRN Princess Bruins, DO       docusate sodium (COLACE) capsule 100 mg  100 mg Oral Daily Mason Jim, Yony Roulston E, MD   100 mg at 02/28/21 0939   DULoxetine (CYMBALTA) DR capsule 60 mg  60 mg Oral Daily Bartholomew Crews E, MD   60 mg at 02/28/21 0939   feeding supplement (ENSURE ENLIVE / ENSURE PLUS) liquid 237 mL  237 mL Oral BID BM Antonieta Pert, MD   237 mL at 02/28/21 0944   fluticasone (FLONASE) 50 MCG/ACT nasal spray 1 spray  1 spray Each Nare Daily Bobbitt, Shalon E, NP   1 spray at 02/28/21 0940   folic acid (FOLVITE) tablet 1 mg  1 mg Oral Daily Princess Bruins, DO   1 mg at 02/28/21 0940   haloperidol (HALDOL) tablet 5 mg  5 mg Oral BID Bartholomew Crews E, MD   5 mg at 02/28/21 3419   hydrOXYzine (ATARAX/VISTARIL) tablet 50 mg  50 mg Oral TID PRN Comer Locket, MD   50 mg at 02/27/21 2121   isosorbide mononitrate (IMDUR) 24 hr tablet 75 mg  75 mg Oral Daily Antonieta Pert, MD   75 mg at 02/28/21 0939   lidocaine (LIDODERM) 5 % 1 patch  1 patch Transdermal Daily PRN Comer Locket, MD       LORazepam (ATIVAN) tablet 1 mg  1 mg Oral Q6H PRN Antonieta Pert, MD   1 mg at 02/25/21 0136   magnesium hydroxide  (MILK OF MAGNESIA) suspension 30 mL  30 mL Oral Daily PRN Princess Bruins, DO   30 mL at 02/28/21 1505   meloxicam (MOBIC) tablet 15 mg  15 mg Oral Daily Antonieta Pert, MD   15 mg at 02/28/21 6222   methocarbamol (ROBAXIN) tablet 750 mg  750 mg Oral Q8H PRN Antonieta Pert, MD   750 mg at 02/27/21 1602   metoprolol tartrate (LOPRESSOR) tablet 25 mg  25 mg Oral BID Antonieta Pert, MD   25 mg at 02/28/21 0939   pantoprazole (PROTONIX) EC tablet 40 mg  40 mg Oral q morning Antonieta Pert, MD   40 mg at 02/28/21 0941   psyllium (HYDROCIL/METAMUCIL) 1 packet  1 packet Oral Daily Princess Bruins, DO   1 packet at 02/27/21 1241   risperiDONE (RISPERDAL M-TABS) disintegrating tablet 2 mg  2 mg Oral Q8H PRN Antonieta Pert, MD       And   ziprasidone (GEODON) injection 20 mg  20 mg Intramuscular Q12H PRN Antonieta Pert, MD       thiamine tablet 100 mg  100 mg Oral Daily Princess Bruins, DO   100 mg at 02/28/21 9798   traZODone (DESYREL) tablet 300 mg  300 mg Oral QHS PRN Princess Bruins, DO   300 mg at 02/27/21 2121   Vitamin D (Ergocalciferol) (DRISDOL) capsule 50,000 Units  50,000 Units Oral Q7 days Antonieta Pert, MD   50,000 Units at 02/22/21 1256    Lab Results:  Results for orders placed or performed during the hospital encounter of 02/21/21 (from the past 48 hour(s))  Carbamazepine level, total     Status: None   Collection Time:  02/27/21  6:43 AM  Result Value Ref Range   Carbamazepine Lvl 6.6 4.0 - 12.0 ug/mL    Comment: Performed at [facility], [address] [place], [place] [address]  CBC with Differential/Platelet     Status: Abnormal   Collection Time: 02/27/21  6:43 AM  Result Value Ref Range   WBC 4.2 4.0 - 10.5 K/uL   RBC 3.96 3.87 - 5.11 MIL/uL   Hemoglobin 13.2 12.0 - 15.0 g/dL   HCT 21.3 08.6 - 57.8 %   MCV 103.8 (H) 80.0 - 100.0 fL   MCH 33.3 26.0 - 34.0 pg   MCHC 32.1 30.0 - 36.0 g/dL   RDW 46.9 62.9 - 52.8 %   Platelets 146 (L) 150 - 400 K/uL    nRBC 0.0 0.0 - 0.2 %   Neutrophils Relative % 35 %   Neutro Abs 1.5 (L) 1.7 - 7.7 K/uL   Lymphocytes Relative 50 %   Lymphs Abs 2.1 0.7 - 4.0 K/uL   Monocytes Relative 9 %   Monocytes Absolute 0.4 0.1 - 1.0 K/uL   Eosinophils Relative 5 %   Eosinophils Absolute 0.2 0.0 - 0.5 K/uL   Basophils Relative 1 %   Basophils Absolute 0.1 0.0 - 0.1 K/uL   Immature Granulocytes 0 %   Abs Immature Granulocytes 0.01 0.00 - 0.07 K/uL    Comment: Performed at [facility], [address] [place], [place] [address]  Comprehensive metabolic panel     Status: Abnormal   Collection Time: 02/27/21  6:43 AM  Result Value Ref Range   Sodium 140 135 - 145 mmol/L   Potassium 4.1 3.5 - 5.1 mmol/L   Chloride 104 98 - 111 mmol/L   CO2 30 22 - 32 mmol/L   Glucose, Bld 98 70 - 99 mg/dL    Comment: Glucose reference range applies only to samples taken after fasting for at least 8 hours.   BUN 15 6 - 20 mg/dL   Creatinine, Ser 4.01 0.44 - 1.00 mg/dL   Calcium 9.1 8.9 - 02.7 mg/dL   Total Protein 6.9 6.5 - 8.1 g/dL   Albumin 4.2 3.5 - 5.0 g/dL   AST 35 15 - 41 U/L   ALT 48 (H) 0 - 44 U/L   Alkaline Phosphatase 75 38 - 126 U/L   Total Bilirubin 0.5 0.3 - 1.2 mg/dL   GFR, Estimated >25 >36 mL/min    Comment: (NOTE) Calculated using the CKD-EPI Creatinine Equation (2021)    Anion gap 6 5 - 15    Comment: Performed at [facility], [address] [place], [place] [address]  Urinalysis, Complete w Microscopic Urine, Clean Catch     Status: Abnormal   Collection Time: 02/27/21  5:22 PM  Result Value Ref Range   Color, Urine YELLOW YELLOW   APPearance HAZY (A) CLEAR   Specific Gravity, Urine 1.029 1.005 - 1.030   pH 5.0 5.0 - 8.0   Glucose, UA NEGATIVE NEGATIVE mg/dL   Hgb urine dipstick NEGATIVE NEGATIVE   Bilirubin Urine NEGATIVE NEGATIVE   Ketones, ur 5 (A) NEGATIVE mg/dL   Protein, ur 30 (A) NEGATIVE mg/dL   Nitrite NEGATIVE NEGATIVE   Leukocytes,Ua TRACE  (A) NEGATIVE   RBC / HPF 0-5 0 - 5 RBC/hpf   WBC, UA 0-5 0 - 5 WBC/hpf   Bacteria, UA RARE (A) NONE SEEN   Squamous Epithelial / LPF 0-5 0 - 5   Mucus PRESENT  Hyaline Casts, UA PRESENT     Comment: Performed at [facility], [address] [place], [place] [id number]    Blood Alcohol level:  Lab Results  Component Value Date   ETH 119 (H) 02/21/2021   ETH 47 (H) 01/25/2020    Metabolic Disorder Labs: Lab Results  Component Value Date   HGBA1C 4.8 02/21/2021   MPG 91.06 02/21/2021   MPG 76.71 08/26/2018   No results found for: PROLACTIN Lab Results  Component Value Date   CHOL 214 (H) 02/21/2021   TRIG 135 02/21/2021   HDL 58 02/21/2021   CHOLHDL 3.7 02/21/2021   VLDL 27 02/21/2021   LDLCALC 129 (H) 02/21/2021   LDLCALC 121 (H) 09/30/2017    Physical Findings: AIMS: Facial and Oral Movements Muscles of Facial Expression: None, normal Lips and Perioral Area: None, normal Jaw: None, normal Tongue: None, normal,Extremity Movements Upper (arms, wrists, hands, fingers): None, normal Lower (legs, knees, ankles, toes): None, normal, Trunk Movements Neck, shoulders, hips: None, normal, Overall Severity Severity of abnormal movements (highest score from questions above): None, normal Incapacitation due to abnormal movements: None, normal Patient's awareness of abnormal movements (rate only patient's report): No Awareness, Dental Status Current problems with teeth and/or dentures?: No Does patient usually wear dentures?: No  CIWA:  CIWA-Ar Total: 3 COWS:     Musculoskeletal: Strength & Muscle Tone: within normal limits Gait & Station: normal Patient leans: N/A  Psychiatric Specialty Exam: Physical Exam Constitutional:      Appearance: Normal appearance. She is obese.  HENT:     Head: Normocephalic.     Nose: Nose normal.  Eyes:     Conjunctiva/sclera: Conjunctivae normal.  Pulmonary:     Effort: Pulmonary effort is normal.   Musculoskeletal:     Cervical back: Normal range of motion.  Neurological:     Mental Status: She is alert and oriented to person, place, and time.    Review of Systems  Constitutional:  Positive for activity change (Improved, get out of bed and goes to group activites). Negative for appetite change and fatigue.  HENT:  Negative for congestion, rhinorrhea and sore throat.   Eyes:  Negative for visual disturbance.  Respiratory:  Negative for cough and shortness of breath.   Cardiovascular:  Negative for chest pain.  Gastrointestinal:  Negative for abdominal pain, constipation (Endorsed chronic constipation and typically have 2 bowel movement a week), nausea and vomiting.  Endocrine: Negative for cold intolerance and heat intolerance.  Genitourinary:  Negative for difficulty urinating (Reported that this is recurrent and has self-resolved in the past), dysuria, flank pain, urgency and vaginal pain.  Musculoskeletal:  Positive for back pain.  Neurological:  Negative for dizziness, tremors, seizures, light-headedness and headaches.  Psychiatric/Behavioral:  Positive for hallucinations ("Can hear sad music in my head"). Negative for agitation, confusion, decreased concentration and suicidal ideas. The patient is not nervous/anxious and is not hyperactive.    Blood pressure 125/69, pulse (!) 59, temperature 98.1 F (36.7 C), temperature source Oral, resp. rate 16, height 5\' 7"  (1.702 m), weight 87.1 kg, SpO2 98 %.Body mass index is 30.07 kg/m.  General Appearance: Casual and Fairly Groomed  Eye Contact:  Fair  Speech:  Clear and Coherent and Normal Rate  Volume:  Normal  Mood:  dysphoric  Affect:brighter appearing affect, less constricted  Thought Process:  Coherent and Linear  Orientation:  Full (Time, Place, and Person)  Thought Content:  Reported hearing music in her head, denied VH, ideas  of reference, paranoia, or first rank symptoms - no evidence of acute psychosis on exam  Suicidal  Thoughts:  No  Homicidal Thoughts:  No  Memory:  Immediate;   Good Recent;   Good Remote;   Good  Judgement:  Fair  Insight:  Fair  Psychomotor Activity:  Normal - AIMS 0 and no tremor noted on exam, no cogwheeling or stiffness  Concentration:  Concentration: Good and Attention Span: Good  Recall:  Good  Fund of Knowledge:  Good  Language:  Good  Akathisia:  No  Handed:  Right  AIMS (if indicated):   0  Assets:  Communication Skills Desire for Improvement Housing Resilience  ADL's:  Intact  Cognition:  within normal limits  Sleep:  Number of Hours: 6.75    Treatment Plan Summary: Daily contact with patient to assess and evaluate symptoms and progress in treatment  Amanda [name], [phone] F who was brought to [facility] (02/21/2021) by [facility] police for SI with a plan. Past psychiatric history of bipolar disorder, cocaine use disorder vs cocaine dependence, alcohol use disorder vs alcohol dependence.  SCH: Cymbalta 60mg  daily, Haldol 5mg  BID, Tegretol 100 BID  Today patient continued exhibited a more cheerful affect. Denied SI/HI/VH, but reported to hearing music inside her head, not from external source. No mood changes today, so will continue Tegretol 100mg  PO BID. Also waiting on labs to trend (ANC 1500 with WBC 4.2, platelets 146, ALT 48). Discontinued gabapentin 100 TID because patient keeps refusing. If labs are reassuring tomorrow, will continue with plan for discharge to [facility] on Monday (03/03/2021). If not, social work stated that they can talk to [facility] to alter the date.    BIPOLAR - Cont Cymbalta 60mg  daily - Cont Carbamazepine 100mg  PO BID for mood stabilization and monitor for mood symptoms with dose reduction - rechecking CBC and CMP for trending of platelets, ANC, electrolytes, and LFT on Saturday - Cont haldol 5mg  BID for AVH and mood stabilization (r/b/se/a to typical antipsychotic  reviewed with patient and she requests to restart this as a home medication) - Cont trazodone 300mg  qHS PRN insomnia  ALCOHOL USE D/O AND STIMULANT USE D/O - COCAINE TYPE - Discontinued neurontin 100mg  TID since patient has been refusing medication - Cont CIWA protocol monitoring with Ativan PO 1mg   PRN for CIWA score >10 with MVI and thiamine oral replacement (recent CIWA scores 3,1,1,1,3) - Patient accepted [facility] Monday  BACK PAIN - Cont Robaxin 750mg  PO 3 times daily as needed for pain - Cont acetaminophen tablet 650mg  q6HR  - Cont Mobic 15mg  PO daily for osteoarthritic pain - Changed diclofenac sodium 1% topical gel 4 times daily for pain to PRN - Change lidoderm 5% patches daily for pain to PRN - Continue Cymbalta 60mg  daily for mood and pain  - Discontinued neurontin 100mg  TID since patient has been refusing medication  ANXIETY - Cont hydroxyzine tablet 50mg  PO 3 times daily PRN for anxiety   NUTRITION - Cont Vitamin D Drisdol 50,000 units PO  - Cont feeding supplement (ENSURE) PO 2 times daily between meals - Cont Folic acid and thiamine   CHEST PAIN - Cont Imdur 75mg  24hour tablet PO daily for chest pain   HYPERTENSION - Cont amlodipine 5mg  PO daily for hypertension and chest pain - Cont clonidine 0.1mg  PO 3 times daily as needed for systolic blood pressure greater than 160 - Cont Lopressor 25mg  PO twice daily for  health rate and hypertension   HYPERLIPIDEMIA - Cont Lipitor  PO daily for hyperlipidemia   GERD - Cont alum & mag hydroxide-simeth 30ml PO every 4hrs PRN - Cont protonix  daily  PERCEIVED URINARY RETENTION- resolved - Bladder scan negative for retention and symptoms improved after bowel movement - Reduced Tegretol to  bid at patient's request to see if perceived symptoms improve - UA shows trace leukocytes, rare bacteria, 30 protein, 5 ketones - will send for urine culture   CONSTIPATION - Cont magnesium hydroxide  30ml PO daily for mild constipation - Continue Colace  daily and Metamucil daily   ASTHMA - Cont albuterol as needed for wheezing and shortness of breath   NASAL CONGESTION - Cont fluticasone 56mcg/act nasal spray daily   AGITATION - Cont Risperdal agitation protocol as needed - Cont Geodon agitation protocol as needed    Princess Bruins, DO 02/28/2021, 3:46 PM

## 2021-02-28 NOTE — [facility] Group Notes (Signed)
[facility] Group Notes:  (Nursing/MHT/Case Management/Adjunct)  Date:  02/28/2021  Time:  6:53 PM  Type of Therapy:  Nurse Education  Participation Level:  Did Not Attend  Participation Quality:     Affect:    Cognitive:    Insight:    Engagement in Group:    Modes of Intervention:  Discussion  Summary of Progress/Problems:  Did not attend despite staff invitation  [name] [name] 02/28/2021, 6:53 PM 

## 2021-02-28 NOTE — [facility] Group Notes (Signed)
Type of Therapy and Topic:  Group Therapy - Healthy vs Unhealthy Coping Skills  Participation Level:  Active   Description of Group The focus of this group was to determine what unhealthy coping techniques typically are used by group members and what healthy coping techniques would be helpful in coping with various problems. Patients were guided in becoming aware of the differences between healthy and unhealthy coping techniques. Patients were asked to identify 2-3 healthy coping skills they would like to learn to use more effectively.  Therapeutic Goals Patients learned that coping is what human beings do all day long to deal with various situations in their lives Patients defined and discussed healthy vs unhealthy coping techniques Patients identified their preferred coping techniques and identified whether these were healthy or unhealthy Patients determined 2-3 healthy coping skills they would like to become more familiar with and use more often. Patients provided support and ideas to each other   Summary of Patient Progress:  Due to the acuity and complex discharge plans, group was not held. Patient was provided therapeutic worksheets and asked to meet with CSW as needed. 

## 2021-02-28 NOTE — [facility] Group Notes (Signed)
[facility] Group Notes:  (Nursing/MHT/Case Management/Adjunct)  Date:  02/28/2021  Time:  7:16 PM  Type of Therapy:  Goal and orientation group  Participation Level:  Did Not Attend  Participation Quality:    Affect:    Cognitive:    Insight:    Engagement in Group:    Modes of Intervention:    Summary of Progress/Problems:  Did not attend despite staff invitation.  [name] V Rynell Ciotti 02/28/2021, 7:16 PM  

## 2021-03-01 LAB — CBC WITH DIFFERENTIAL/PLATELET
Abs Immature Granulocytes: 0.01 10*3/uL (ref 0.00–0.07)
Basophils Absolute: 0 10*3/uL (ref 0.0–0.1)
Basophils Relative: 1 %
Eosinophils Absolute: 0.2 10*3/uL (ref 0.0–0.5)
Eosinophils Relative: 5 %
HCT: 41.9 % (ref 36.0–46.0)
Hemoglobin: 13.9 g/dL (ref 12.0–15.0)
Immature Granulocytes: 0 %
Lymphocytes Relative: 49 %
Lymphs Abs: 2 10*3/uL (ref 0.7–4.0)
MCH: 33.8 pg (ref 26.0–34.0)
MCHC: 33.2 g/dL (ref 30.0–36.0)
MCV: 101.9 fL — ABNORMAL HIGH (ref 80.0–100.0)
Monocytes Absolute: 0.4 10*3/uL (ref 0.1–1.0)
Monocytes Relative: 9 %
Neutro Abs: 1.5 10*3/uL — ABNORMAL LOW (ref 1.7–7.7)
Neutrophils Relative %: 36 %
Platelets: 164 10*3/uL (ref 150–400)
RBC: 4.11 MIL/uL (ref 3.87–5.11)
RDW: 13.1 % (ref 11.5–15.5)
WBC: 4.1 10*3/uL (ref 4.0–10.5)
nRBC: 0 % (ref 0.0–0.2)

## 2021-03-01 LAB — COMPREHENSIVE METABOLIC PANEL
ALT: 53 U/L — ABNORMAL HIGH (ref 0–44)
AST: 37 U/L (ref 15–41)
Albumin: 4.1 g/dL (ref 3.5–5.0)
Alkaline Phosphatase: 78 U/L (ref 38–126)
Anion gap: 9 (ref 5–15)
BUN: 14 mg/dL (ref 6–20)
CO2: 28 mmol/L (ref 22–32)
Calcium: 9.5 mg/dL (ref 8.9–10.3)
Chloride: 104 mmol/L (ref 98–111)
Creatinine, Ser: 0.9 mg/dL (ref 0.44–1.00)
GFR, Estimated: >60 mL/min (ref >60)
Glucose, Bld: 92 mg/dL (ref 70–99)
Potassium: 4.4 mmol/L (ref 3.5–5.1)
Sodium: 141 mmol/L (ref 135–145)
Total Bilirubin: 0.7 mg/dL (ref 0.3–1.2)
Total Protein: 7.1 g/dL (ref 6.5–8.1)

## 2021-03-01 MED ORDER — GABAPENTIN 100 MG PO CAPS
100.0000 mg | ORAL_CAPSULE | Freq: Three times a day (TID) | ORAL | Status: DC
  Administered 2021-03-01 – 2021-03-04 (×9): 100 mg via ORAL
  Filled 2021-03-01 (×15): qty 1

## 2021-03-01 MED ORDER — CARBAMAZEPINE 100 MG PO CHEW
100.0000 mg | CHEWABLE_TABLET | Freq: Every day | ORAL | Status: DC
  Administered 2021-03-02: 100 mg via ORAL
  Filled 2021-03-01 (×2): qty 1

## 2021-03-01 MED ORDER — ACETAMINOPHEN 500 MG PO TABS
500.0000 mg | ORAL_TABLET | Freq: Four times a day (QID) | ORAL | Status: DC | PRN
Start: 2021-03-01 — End: 2021-03-01

## 2021-03-01 MED ORDER — ACETAMINOPHEN 500 MG PO TABS
500.0000 mg | ORAL_TABLET | Freq: Three times a day (TID) | ORAL | Status: DC | PRN
  Administered 2021-03-01 – 2021-03-02 (×2): 500 mg via ORAL
  Filled 2021-03-01 (×2): qty 1

## 2021-03-01 NOTE — [facility] Group Notes (Signed)
LCSW Group Therapy Note 03/01/2021  11:15am-12:00pm  Type of Therapy and Topic:  Group Therapy: Anger and Commonalities  Participation Level:  Did Not Attend   Description of Group: In this group, patients initially shared an "unknown" fact about themselves and CSW led a discussion about the ways in which we have things in common without realizing it.  Patient then identified a recent time they became angry and how this yet again showed a way in which they had something in common with other patients  We discussed possible unhealthy reactions to anger and possible healthy reactions.  We also discussed possible underlying emotions that lead to the anger.  Commonalities among group members were pointed out throughout the entirety of group.  Therapeutic Goals: Patients were asked to share something about themselves and learned that they often have things in common with other people without knowing this Patients will remember their last incident of anger and how they reacted Patients will be able to identify their reaction as healthy or unhealthy, and identify possible reactions that would have been the opposite Patients will learn that anger itself is a secondary emotion and will think about their primary emotion at the time of their last incident of anger  Summary of Patient Progress:  The patient was invited to group, opted not to attend.  Therapeutic Modalities:   Cognitive Behavioral Therapy  Lynnell Chad, LCSW 03/01/2021  1:49 PM

## 2021-03-01 NOTE — [facility] Counselor (Signed)
Clinical Social Work Note  CSW called Primary school teacher at [employer] [phone] to inform the facility that patient has some labs that will necessitate her staying at this hospital until Tuesday 7/12.  That is fine.  They are working on arranging transportation for her, will be purchasing her a bus ticket and ask that the hospital get her to the bus station.  They are calling her to ensure that she can get a confirmation email on her phone once she gets her phone out of the locker.  Ambrose Mantle, LCSW 03/01/2021, 3:00 PM

## 2021-03-01 NOTE — Progress Notes (Addendum)
   03/01/21 1024  Psych Admission Type (Psych Patients Only)  Admission Status Voluntary  Psychosocial Assessment  Patient Complaints None  Eye Contact Fair  Facial Expression Animated;Anxious  Affect Anxious  Speech Logical/coherent  Interaction Assertive  Motor Activity Slow  Appearance/Hygiene Unremarkable  Behavior Characteristics Cooperative;Calm  Mood Anxious;Pleasant  Thought Process  Coherency WDL  Content WDL  Delusions None reported or observed  Perception WDL  Hallucination None reported or observed  Judgment Poor  Confusion None  Danger to Self  Current suicidal ideation? Denies  Self-Injurious Behavior No self-injurious ideation or behavior indicators observed or expressed   Agreement Not to Harm Self No  Description of Agreement verbal agreement to contract for safety  Danger to Others  Danger to Others None reported or observed   D. Pt presents with an improving mood- calm, cooperative behavior- has been visible in the milieu, but did not attend groups this am despite encouragement from staff. Pt currently denies SI/HI and AVH A. Labs and vitals monitored. Pt compliant with medications. Pt supported emotionally and encouraged to express concerns and ask questions.   R. Pt remains safe with 15 minute checks. Will continue POC.

## 2021-03-01 NOTE — Plan of Care (Signed)
  Problem: Education: Goal: Emotional status will improve Outcome: Progressing Goal: Mental status will improve Outcome: Progressing   Problem: Medication: Goal: Compliance with prescribed medication regimen will improve Outcome: Progressing

## 2021-03-01 NOTE — Progress Notes (Signed)
   Pt reported she experienced mild chest pain overnight.  Pt denies any chest pain at this time.  Administered scheduled dose of IMDUR 75 mg per MAR.  Provider notified.

## 2021-03-01 NOTE — [facility] Counselor (Signed)
Clinical Social Work Note  CSW received phone request from [employer] at [employer] (phone# [phone]) for records to be faxed in preparation for patient's admission on Monday 7/11.  CSW faxed H&P, last 2 days of progress notes, face sheet, medication list to [phone].  Ambrose Mantle, LCSW 03/01/2021, 1:35 PM

## 2021-03-01 NOTE — [facility] Group Notes (Signed)
[facility] Group Notes:  (Nursing/MHT/Case Management/Adjunct)   Date:  03/01/2021  Time:  11:21 AM   Type of Therapy:   Goals and orientation group   Participation Level:  Did Not Attend   Participation Quality:     Affect:     Cognitive:     Insight:     Engagement in Group:     Modes of Intervention:     Summary of Progress/Problems:  Did not attend despite staff invitation.   Amanda [name] 03/01/2021, 11:21 AM 

## 2021-03-01 NOTE — [facility] Group Notes (Signed)
[facility] Group Notes:  (Nursing/MHT/Case Management/Adjunct)  Date:  03/01/2021  Time:  7:20 PM  Type of Therapy:  Nurse Education  Participation Level:  Active  Participation Quality:  Attentive  Affect:  Depressed  Cognitive:  Alert and Appropriate  Insight:  Improving  Engagement in Group:  Developing/Improving  Modes of Intervention:  Education  Summary of Progress/Problems:  Discussed sleep hygiene and handout given.   Ondine Gemme V Jaedah Lords 03/01/2021, 7:20 PM  

## 2021-03-01 NOTE — Progress Notes (Addendum)
[facility] MD Progress Note  03/01/2021 10:43 AM Amanda [name]  MRN:  [id number] Subjective:  Amanda [name], [phone] F who was brought to [facility] (02/21/2021) by [facility] police for SI with a plan. Past psychiatric history of bipolar disorder, cocaine use disorder vs cocaine dependence, alcohol use disorder vs alcohol dependence. [facility] stay day 8.   Last night:  Patient slept Number of Hours: 6.75. Patient stated that she slept well.  Patient did attend PM group activities. Patient was mostly compliant with scheduled meds.  Today (03/01/2021): Vital signs are within normal limits. Patient was laying in bed calm and cooperative with interview. Patient denied SI/HI/AVH.  Stated that she no longer has feelings of urinary retention. Stated she had no issues with bowel movements and voiding. Denied urinary urgency, urinary frequency, dysuria.  Patient endorses back and shin pain that is dull, intermittent, and tender to palpation. Stated that she does not want gabapentin, voltaren gel or lidocaine patch. Stated that she prefers the neurontin that she was given on (02/26/2021).  Patient reported episode of chest pain last night that was immediately relieved with imdur. Stated that chest pain is chronic.  Patient denied tremor, headache, dizziness, N/V, shortness of breath, abdominal pain.  Informed patient that her ALT (53) has increased. Patient stated she understood that we need to extend length of stay due to med changes.  Principal Problem: Bipolar I disorder, most recent episode depressed, severe w psychosis (HCC) Diagnosis: Principal Problem:   Bipolar I disorder, most recent episode depressed, severe w psychosis (HCC) Active Problems:   Cocaine abuse (HCC)   Alcohol abuse  Total Time spent with patient: 25 minutes  Past Psychiatric History:  Past psychiatric history of bipolar disorder, cocaine use disorder vs cocaine dependence, alcohol use disorder vs alcohol  dependence.   Past psychiatric hospitalization: [facility] (11/16/2014 - 11/21/2014) for SI without a plan Past suicide attempts: reported 7 attempts over past 6 months in chart review  Past Medical History:  Past Medical History:  Diagnosis Date   Anxiety    Arthritis    lower back   Asthma    Bipolar disorder (HCC)    Carpal tunnel syndrome, bilateral    Chronic back pain    Depression    Diverticulosis    Fx. left wrist    GERD (gastroesophageal reflux disease)    Heart murmur    "born with"   Heart murmur    HLD (hyperlipidemia)    Hypertension    IBS (irritable bowel syndrome)    Internal hemorrhoids    Muscle spasms of neck    Neuropathy    PUD (peptic ulcer disease)     Past Surgical History:  Procedure Laterality Date   ARTHRODESIS METATARSALPHALANGEAL JOINT (MTPJ) Right 04/29/2016   Procedure: RIGHT GREAT TOE METATARSOPHALANGEAL JOINT (MTPJ) FUSION, HARDWARE REMOVAL;  Surgeon: Tarry Kos, MD;  Location: [facility];  Service: Orthopedics;  Laterality: Right;  RIGHT GREAT TOE METATARSOPHALANGEAL JOINT (MTPJ) FUSION, HARDWARE REMOVAL   CARPAL TUNNEL RELEASE Right 2003   CHOLECYSTECTOMY N/A 02/09/2014   Procedure: LAPAROSCOPIC CHOLECYSTECTOMY WITH INTRAOPERATIVE CHOLANGIOGRAM;  Surgeon: Valarie Merino, MD;  Location: [facility];  Service: General;  Laterality: N/A;   CHOLECYSTECTOMY     FOOT SURGERY     HAMMER TOE FUSION Bilateral 2008   HARDWARE REMOVAL Right 04/29/2016   Procedure: HARDWARE REMOVAL;  Surgeon: Tarry Kos, MD;  Location: [facility];  Service: Orthopedics;  Laterality: Right;  HARDWARE REMOVAL   LEFT HEART CATH AND CORONARY ANGIOGRAPHY N/A 08/30/2018   Procedure: LEFT HEART CATH AND CORONARY ANGIOGRAPHY;  Surgeon: Marykay LexHarding, David W, MD;  [facility];  Service: Cardiovascular;  Laterality: N/A;   ROTATOR CUFF REPAIR Right 2011   WRIST FRACTURE SURGERY Left    Family History:  Family History  Problem Relation Age of  Onset   Pancreatic cancer Mother    CAD Father    Diabetes Mellitus II Father    Heart failure Father    Diabetes Father    Breast cancer Paternal Grandmother    HIV/AIDS Brother    Other Sister        Brain tumor   Colon cancer Neg Hx    Family Psychiatric  History: Patient denied any family psychiatric history, suicide, or substance abuse. Social History:  Social History   Substance and Sexual Activity  Alcohol Use Yes   Comment: heavily daily     Social History   Substance and Sexual Activity  Drug Use Yes   Types: Cocaine, "Crack" cocaine, Marijuana   Comment: last used two years ago    Social History   Socioeconomic History   Marital status: Single    Spouse name: Not on file   Number of children: 0   Years of education: 12   Highest education level: Not on file  Occupational History   Occupation: Cook    Comment: [employer]  Tobacco Use   Smoking status: Light Smoker    Pack years: 0.00    Types: Cigarettes   Smokeless tobacco: Never   Tobacco comments:    "takes a puff sometimes"  Substance and Sexual Activity   Alcohol use: Yes    Comment: heavily daily   Drug use: Yes    Types: Cocaine, "Crack" cocaine, Marijuana    Comment: last used two years ago   Sexual activity: Yes    Partners: Female  Other Topics Concern   Not on file  Social History Narrative   ** Merged History Encounter **       Pt lives at home alone. She has a HS education level and does not have any children.  She drinks 2 cups of caffeine daily.   Social Determinants of Health   Financial Resource Strain: Not on file  Food Insecurity: Not on file  Transportation Needs: Not on file  Physical Activity: Not on file  Stress: Not on file  Social Connections: Not on file    Sleep: Good  Appetite:  Good  Current Medications: Current Facility-Administered Medications  Medication Dose Route Frequency Provider Last Rate Last Admin   acetaminophen (TYLENOL) tablet 500 mg  500 mg  Oral Q8H PRN Mason JimSingleton, Bertie Mcconathy E, MD       albuterol (VENTOLIN HFA) 108 (90 Base) MCG/ACT inhaler 2 puff  2 puff Inhalation Q4H PRN Antonieta Pertlary, Greg Lawson, MD   2 puff at 02/27/21 0754   alum & mag hydroxide-simeth (MAALOX/MYLANTA) 200-200-20 MG/5ML suspension 30 mL  30 mL Oral Q4H PRN Antonieta Pertlary, Greg Lawson, MD   30 mL at 02/23/21 1736   amLODipine (NORVASC) tablet 5 mg  5 mg Oral Daily Antonieta Pertlary, Greg Lawson, MD   5 mg at 03/01/21 0900   atorvastatin (LIPITOR) tablet 40 mg  40 mg Oral Daily Antonieta Pertlary, Greg Lawson, MD   40 mg at 03/01/21 0900   carbamazepine (TEGRETOL) chewable tablet 100 mg  100 mg Oral Daily Comer LocketSingleton, Davione Lenker E, MD  cloNIDine (CATAPRES) tablet 0.1 mg  0.1 mg Oral TID PRN Antonieta Pert, MD       diclofenac Sodium (VOLTAREN) 1 % topical gel 4 g  4 g Topical QID PRN Princess Bruins, DO       docusate sodium (COLACE) capsule 100 mg  100 mg Oral Daily Zeola Brys E, MD   100 mg at 03/01/21 0900   DULoxetine (CYMBALTA) DR capsule 60 mg  60 mg Oral Daily Romond Pipkins E, MD   60 mg at 03/01/21 0900   feeding supplement (ENSURE ENLIVE / ENSURE PLUS) liquid 237 mL  237 mL Oral BID BM Antonieta Pert, MD   237 mL at 02/28/21 0944   fluticasone (FLONASE) 50 MCG/ACT nasal spray 1 spray  1 spray Each Nare Daily Bobbitt, Shalon E, NP   1 spray at 03/01/21 0900   folic acid (FOLVITE) tablet 1 mg  1 mg Oral Daily Princess Bruins, DO   1 mg at 03/01/21 0900   gabapentin (NEURONTIN) capsule 100 mg  100 mg Oral TID Comer Locket, MD       haloperidol (HALDOL) tablet 5 mg  5 mg Oral BID Raegan Winders E, MD   5 mg at 03/01/21 0900   hydrOXYzine (ATARAX/VISTARIL) tablet 50 mg  50 mg Oral TID PRN Comer Locket, MD   50 mg at 02/28/21 2123   isosorbide mononitrate (IMDUR) 24 hr tablet 75 mg  75 mg Oral Daily Antonieta Pert, MD   75 mg at 03/01/21 0650   lidocaine (LIDODERM) 5 % 1 patch  1 patch Transdermal Daily PRN Comer Locket, MD       LORazepam (ATIVAN) tablet 1 mg  1 mg Oral Q6H PRN Antonieta Pert, MD   1 mg at 02/25/21 0136   magnesium hydroxide (MILK OF MAGNESIA) suspension 30 mL  30 mL Oral Daily PRN Princess Bruins, DO   30 mL at 02/28/21 1505   meloxicam (MOBIC) tablet 15 mg  15 mg Oral Daily Antonieta Pert, MD   15 mg at 03/01/21 0900   methocarbamol (ROBAXIN) tablet 750 mg  750 mg Oral Q8H PRN Antonieta Pert, MD   750 mg at 02/27/21 1602   metoprolol tartrate (LOPRESSOR) tablet 25 mg  25 mg Oral BID Antonieta Pert, MD   25 mg at 03/01/21 0900   pantoprazole (PROTONIX) EC tablet 40 mg  40 mg Oral q morning Antonieta Pert, MD   40 mg at 03/01/21 0900   psyllium (HYDROCIL/METAMUCIL) 1 packet  1 packet Oral Daily Princess Bruins, DO   1 packet at 02/27/21 1241   risperiDONE (RISPERDAL M-TABS) disintegrating tablet 2 mg  2 mg Oral Q8H PRN Antonieta Pert, MD       thiamine tablet 100 mg  100 mg Oral Daily Princess Bruins, DO   100 mg at 03/01/21 0900   traZODone (DESYREL) tablet 300 mg  300 mg Oral QHS PRN Princess Bruins, DO   300 mg at 02/28/21 2123   Vitamin D (Ergocalciferol) (DRISDOL) capsule 50,000 Units  50,000 Units Oral Q7 days Antonieta Pert, MD   50,000 Units at 02/22/21 1256    Lab Results:  Results for orders placed or performed during the hospital encounter of 02/21/21 (from the past 48 hour(s))  Urinalysis, Complete w Microscopic Urine, Clean Catch     Status: Abnormal   Collection Time: 02/27/21  5:22 PM  Result Value Ref Range   Color, Urine  YELLOW YELLOW   APPearance HAZY (A) CLEAR   Specific Gravity, Urine 1.029 1.005 - 1.030   pH 5.0 5.0 - 8.0   Glucose, UA NEGATIVE NEGATIVE mg/dL   Hgb urine dipstick NEGATIVE NEGATIVE   Bilirubin Urine NEGATIVE NEGATIVE   Ketones, ur 5 (A) NEGATIVE mg/dL   Protein, ur 30 (A) NEGATIVE mg/dL   Nitrite NEGATIVE NEGATIVE   Leukocytes,Ua TRACE (A) NEGATIVE   RBC / HPF 0-5 0 - 5 RBC/hpf   WBC, UA 0-5 0 - 5 WBC/hpf   Bacteria, UA RARE (A) NONE SEEN   Squamous Epithelial / LPF 0-5 0 - 5   Mucus PRESENT     Hyaline Casts, UA PRESENT     Comment: Performed at [facility], [address], [place], [place] [address]  CBC with Differential/Platelet     Status: Abnormal   Collection Time: 03/01/21  6:47 AM  Result Value Ref Range   WBC 4.1 4.0 - 10.5 K/uL   RBC 4.11 3.87 - 5.11 MIL/uL   Hemoglobin 13.9 12.0 - 15.0 g/dL   HCT 00.8 67.6 - 19.5 %   MCV 101.9 (H) 80.0 - 100.0 fL   MCH 33.8 26.0 - 34.0 pg   MCHC 33.2 30.0 - 36.0 g/dL   RDW 09.3 26.7 - 12.4 %   Platelets 164 150 - 400 K/uL   nRBC 0.0 0.0 - 0.2 %   Neutrophils Relative % 36 %   Neutro Abs 1.5 (L) 1.7 - 7.7 K/uL   Lymphocytes Relative 49 %   Lymphs Abs 2.0 0.7 - 4.0 K/uL   Monocytes Relative 9 %   Monocytes Absolute 0.4 0.1 - 1.0 K/uL   Eosinophils Relative 5 %   Eosinophils Absolute 0.2 0.0 - 0.5 K/uL   Basophils Relative 1 %   Basophils Absolute 0.0 0.0 - 0.1 K/uL   Immature Granulocytes 0 %   Abs Immature Granulocytes 0.01 0.00 - 0.07 K/uL    Comment: Performed at [facility], [address], [place], [place] [address]  Comprehensive metabolic panel     Status: Abnormal   Collection Time: 03/01/21  6:47 AM  Result Value Ref Range   Sodium 141 135 - 145 mmol/L   Potassium 4.4 3.5 - 5.1 mmol/L   Chloride 104 98 - 111 mmol/L   CO2 28 22 - 32 mmol/L   Glucose, Bld 92 70 - 99 mg/dL    Comment: Glucose reference range applies only to samples taken after fasting for at least 8 hours.   BUN 14 6 - 20 mg/dL   Creatinine, Ser 8.33 0.44 - 1.00 mg/dL   Calcium 9.5 8.9 - 82.5 mg/dL   Total Protein 7.1 6.5 - 8.1 g/dL   Albumin 4.1 3.5 - 5.0 g/dL   AST 37 15 - 41 U/L   ALT 53 (H) 0 - 44 U/L   Alkaline Phosphatase 78 38 - 126 U/L   Total Bilirubin 0.7 0.3 - 1.2 mg/dL   GFR, Estimated >05 >39 mL/min    Comment: (NOTE) Calculated using the CKD-EPI Creatinine Equation (2021)    Anion gap 9 5 - 15    Comment: Performed at [facility], [address], [place],  [place] [address]    Blood Alcohol level:  Lab Results  Component Value Date   ETH 119 (H) 02/21/2021   ETH 47 (H) 01/25/2020    Metabolic Disorder Labs: Lab Results  Component Value Date   HGBA1C 4.8 02/21/2021  MPG 91.06 02/21/2021   MPG 76.71 08/26/2018   No results found for: PROLACTIN Lab Results  Component Value Date   CHOL 214 (H) 02/21/2021   TRIG 135 02/21/2021   HDL 58 02/21/2021   CHOLHDL 3.7 02/21/2021   VLDL 27 02/21/2021   LDLCALC 129 (H) 02/21/2021   LDLCALC 121 (H) 09/30/2017    Physical Findings: AIMS: Facial and Oral Movements Muscles of Facial Expression: None, normal Lips and Perioral Area: None, normal Jaw: None, normal Tongue: None, normal,Extremity Movements Upper (arms, wrists, hands, fingers): None, normal Lower (legs, knees, ankles, toes): None, normal, Trunk Movements Neck, shoulders, hips: None, normal, Overall Severity Severity of abnormal movements (highest score from questions above): None, normal Incapacitation due to abnormal movements: None, normal Patient's awareness of abnormal movements (rate only patient's report): No Awareness, Dental Status Current problems with teeth and/or dentures?: No Does patient usually wear dentures?: No  CIWA:  CIWA-Ar Total: 1 COWS:     Musculoskeletal: Strength & Muscle Tone: within normal limits Gait & Station: normal Patient leans: N/A  Psychiatric Specialty Exam: Physical Exam Constitutional:      Appearance: Normal appearance. She is obese.  HENT:     Head: Normocephalic.     Nose: Nose normal.  Eyes:     Conjunctiva/sclera: Conjunctivae normal.  Pulmonary:     Effort: Pulmonary effort is normal.  Musculoskeletal:     Cervical back: Normal range of motion.  Neurological:     Mental Status: She is alert and oriented to person, place, and time.    Review of Systems  Constitutional:  Positive for activity change (Improved, get out of bed and goes to group activites). Negative for appetite  change and fatigue.  HENT:  Negative for congestion, rhinorrhea and sore throat.   Eyes:  Negative for visual disturbance.  Respiratory:  Negative for cough and shortness of breath.   Cardiovascular:  Negative for chest pain.  Gastrointestinal:  Negative for abdominal pain, constipation (Endorsed chronic constipation and typically have 2 bowel movement a week), nausea and vomiting.  Endocrine: Negative for cold intolerance and heat intolerance.  Genitourinary:  Negative for difficulty urinating (Reported that this is recurrent and has self-resolved in the past), dysuria, flank pain, urgency and vaginal pain.  Musculoskeletal:  Positive for back pain.  Neurological:  Negative for dizziness, tremors, seizures, light-headedness and headaches.  Psychiatric/Behavioral:  Negative for agitation, confusion, decreased concentration, hallucinations, sleep disturbance and suicidal ideas. The patient is not nervous/anxious and is not hyperactive.    Blood pressure 125/83, pulse 64, temperature 97.9 F (36.6 C), temperature source Oral, resp. rate 16, height  (1.702 m), weight 87.1 kg, SpO2 100 %.Body mass index is 30.07 kg/m.  General Appearance: Casual and Fairly Groomed  Eye Contact:  Fair  Speech:  Clear and Coherent and Normal Rate  Volume:  Normal  Mood:  described as improving - appears calm  Affect:brighter appearing affect, less constricted  Thought Process:  Coherent and Linear  Orientation:  Full (Time, Place, and Person)  Thought Content:  Denied AVH, ideas of reference, paranoia, or first rank symptoms - no evidence of acute psychosis on exam  Suicidal Thoughts:  No  Homicidal Thoughts:  No  Memory:  Immediate;   Good Recent;   Good Remote;   Good  Judgement:  Fair  Insight:  Fair  Psychomotor Activity:  Normal - AIMS 0 and no tremor noted on exam, no cogwheeling or stiffness  Concentration:  Concentration: Good and Attention Span: Good  Recall:  Good  Fund of Knowledge:  Good   Language:  Good  Akathisia:  No  Handed:  Right  AIMS (if indicated):   0  Assets:  Communication Skills Desire for Improvement Housing Resilience  ADL's:  Intact  Cognition:  within normal limits  Sleep:  Number of Hours: 6.75    Treatment Plan Summary: Daily contact with patient to assess and evaluate symptoms and progress in treatment  Amanda [name], [phone] F who was brought to [facility] (02/21/2021) by [facility] police for SI with a plan. Past psychiatric history of bipolar disorder, cocaine use disorder vs cocaine dependence, alcohol use disorder vs alcohol dependence.  Today patient continued exhibited a more cheerful affect. Denied SI/HI/AVH. Consistently sleeping well for past few days at 6.75hours. Discontinuing Tegretol due to elevating ALT (53) and decreasing ANC (1.5).Tegretol 100mg  x2 days then stop with and last dose tomorrow (03/02/2021).   Restart gabapentin 100 TID for chronic pain and mood stability Sent urine for culture, pending results.   BIPOLAR - Cont Cymbalta 60mg  daily - Tapering off Carbamazepine - reduce to 100mg  PO daily for 2 days then stop   Elevating ALT (53) and decreasing ANC (1.5) - rechecking CBC and CMP Monday for trending - Cont haldol 5mg  BID for AVH and mood stabilization (r/b/se/a to typical antipsychotic reviewed with patient and she requests to restart this as a home medication) - Cont trazodone 300mg  qHS PRN insomnia  ALCOHOL USE D/O AND STIMULANT USE D/O - COCAINE TYPE - Restart Neurontin 100mg  tid to help with pain and potential cravings, and mood  - Cont CIWA protocol monitoring with Ativan PO 1mg   PRN for CIWA score >10 with MVI and thiamine oral replacement  - Patient accepted to [facility] for next week  BACK PAIN - Cont Robaxin 750mg  PO 3 times daily as needed for pain - Changed acetaminophen tablet 650mg  q6HR to 500mg  q8 hours PRN due to worsening ALT - Cont Mobic 15mg  PO daily  for osteoarthritic pain - Cont diclofenac sodium 1% topical gel 4 times daily for pain to PRN - Cont lidoderm 5% patches daily for pain to PRN - Continue Cymbalta 60mg  daily for mood and pain  - Restart neurontin 100mg  TID  ANXIETY - Cont hydroxyzine tablet 50mg  PO 3 times daily PRN for anxiety   NUTRITION - Cont Vitamin D Drisdol 50,000 units PO  - Cont feeding supplement (ENSURE) PO 2 times daily between meals - Cont Folic acid and thiamine   CHEST PAIN - Cont Imdur 75mg  24hour tablet PO daily for chest pain - Refuses EKG today   HYPERTENSION - Cont amlodipine 5mg  PO daily for hypertension and chest pain - Cont clonidine 0.1mg  PO 3 times daily as needed for systolic blood pressure greater than 160 - Cont Lopressor 25mg  PO twice daily for health rate and hypertension   HYPERLIPIDEMIA - Cont Lipitor 40mg  PO daily for hyperlipidemia   GERD - Cont alum & mag hydroxide-simeth 22ml PO every 4hrs PRN - Cont protonix 40mg  daily  PERCEIVED URINARY RETENTION- resolved - Bladder scan negative for retention and symptoms improved after bowel movement - UA shows trace leukocytes, rare bacteria, 30 protein, 5 ketones - urine culture pending   CONSTIPATION - Cont magnesium hydroxide 32ml PO daily for mild constipation - Continue Colace 100mg  daily and Metamucil daily   ASTHMA - Cont albuterol as needed for wheezing and shortness of breath   NASAL CONGESTION - Cont fluticasone 12mcg/act nasal spray  daily   AGITATION - Cont Risperdal agitation protocol as needed - Cont Geodon agitation protocol as needed    Princess Bruins, DO 03/01/2021, 10:43 AM

## 2021-03-02 LAB — HIV ANTIBODY (ROUTINE TESTING W REFLEX): HIV Screen 4th Generation wRfx: NONREACTIVE

## 2021-03-02 MED ORDER — BISACODYL 5 MG PO TBEC
5.0000 mg | DELAYED_RELEASE_TABLET | Freq: Every day | ORAL | Status: DC | PRN
  Administered 2021-03-02: 5 mg via ORAL
  Filled 2021-03-02: qty 1

## 2021-03-02 NOTE — Plan of Care (Signed)
  Problem: Education: Goal: Emotional status will improve Outcome: Progressing   Problem: Activity: Goal: Interest or engagement in activities will improve Outcome: Not Progressing Goal: Sleeping patterns will improve Outcome: Not Progressing

## 2021-03-02 NOTE — Progress Notes (Signed)
Pt did not attend group. 

## 2021-03-02 NOTE — Progress Notes (Addendum)
[facility] MD Progress Note  03/02/2021 3:18 PM Amanda [name]  MRN:  [id number] Subjective:  [name], [phone] F who was brought to [facility] (02/21/2021) by [facility] police for SI with a plan. Past psychiatric history of bipolar disorder, cocaine use disorder vs cocaine dependence, alcohol use disorder vs alcohol dependence. [facility] stay day 9.   Last night:  Patient slept Number of Hours: 4.75. Patient stated that she did not sleep well.  Patient did attend PM group activities. Patient refused her AM Tegretol but took all other meds.  Today (03/02/2021): Vital signs are within normal limits. Patient was laying in bed calm and cooperative with interview. Patient denied SI/HI/AVH.  Patient reports sleeping poorly due to "having a lot on my mind". She is worried about going to a new place and says that she cannot go back to her apartment, voicing insight about how the people there make her more likely to use drugs. She agrees with plan to d/c on Tuesday. After a discussion about the importance of her Tegretol taper, patient agrees to take medication for final dose this AM. Pt requests HIV testing. Consent signed and in process for nursing to upload.  Pt reports constipation without relief from milk of magnesia. Dulcolax ordered.   Principal Problem: Bipolar I disorder, most recent episode depressed, severe w psychosis (HCC) Diagnosis: Principal Problem:   Bipolar I disorder, most recent episode depressed, severe w psychosis (HCC) Active Problems:   Cocaine abuse (HCC)   Alcohol abuse  Total Time spent with patient: 25 minutes  Past Psychiatric History:  Past psychiatric history of bipolar disorder, cocaine use disorder vs cocaine dependence, alcohol use disorder vs alcohol dependence.   Past psychiatric hospitalization: [facility] (11/16/2014 - 11/21/2014) for SI without a plan Past suicide attempts: reported 7 attempts over past 6 months in chart review  Past  Medical History:  Past Medical History:  Diagnosis Date   Anxiety    Arthritis    lower back   Asthma    Bipolar disorder (HCC)    Carpal tunnel syndrome, bilateral    Chronic back pain    Depression    Diverticulosis    Fx. left wrist    GERD (gastroesophageal reflux disease)    Heart murmur    "born with"   Heart murmur    HLD (hyperlipidemia)    Hypertension    IBS (irritable bowel syndrome)    Internal hemorrhoids    Muscle spasms of neck    Neuropathy    PUD (peptic ulcer disease)     Past Surgical History:  Procedure Laterality Date   ARTHRODESIS METATARSALPHALANGEAL JOINT (MTPJ) Right 04/29/2016   Procedure: RIGHT GREAT TOE METATARSOPHALANGEAL JOINT (MTPJ) FUSION, HARDWARE REMOVAL;  Surgeon: Tarry Kos, MD;  Location: [facility];  Service: Orthopedics;  Laterality: Right;  RIGHT GREAT TOE METATARSOPHALANGEAL JOINT (MTPJ) FUSION, HARDWARE REMOVAL   CARPAL TUNNEL RELEASE Right 2003   CHOLECYSTECTOMY N/A 02/09/2014   Procedure: LAPAROSCOPIC CHOLECYSTECTOMY WITH INTRAOPERATIVE CHOLANGIOGRAM;  Surgeon: Valarie Merino, MD;  Location: [facility];  Service: General;  Laterality: N/A;   CHOLECYSTECTOMY     FOOT SURGERY     HAMMER TOE FUSION Bilateral 2008   HARDWARE REMOVAL Right 04/29/2016   Procedure: HARDWARE REMOVAL;  Surgeon: Tarry Kos, MD;  Location: [facility];  Service: Orthopedics;  Laterality: Right;  HARDWARE REMOVAL   LEFT HEART CATH AND CORONARY ANGIOGRAPHY N/A 08/30/2018   Procedure: LEFT HEART CATH  AND CORONARY ANGIOGRAPHY;  Surgeon: Marykay Lex, MD;  Location: [facility];  Service: Cardiovascular;  Laterality: N/A;   ROTATOR CUFF REPAIR Right 2011   WRIST FRACTURE SURGERY Left    Family History:  Family History  Problem Relation Age of Onset   Pancreatic cancer Mother    CAD Father    Diabetes Mellitus II Father    Heart failure Father    Diabetes Father    Breast cancer Paternal Grandmother    HIV/AIDS Brother     Other Sister        Brain tumor   Colon cancer Neg Hx    Family Psychiatric  History: Patient denied any family psychiatric history, suicide, or substance abuse. Social History:  Social History   Substance and Sexual Activity  Alcohol Use Yes   Comment: heavily daily     Social History   Substance and Sexual Activity  Drug Use Yes   Types: Cocaine, "Crack" cocaine, Marijuana   Comment: last used two years ago    Social History   Socioeconomic History   Marital status: Single    Spouse name: Not on file   Number of children: 0   Years of education: 12   Highest education level: Not on file  Occupational History   Occupation: Cook    Comment: [employer]  Tobacco Use   Smoking status: Light Smoker    Pack years: 0.00    Types: Cigarettes   Smokeless tobacco: Never   Tobacco comments:    "takes a puff sometimes"  Substance and Sexual Activity   Alcohol use: Yes    Comment: heavily daily   Drug use: Yes    Types: Cocaine, "Crack" cocaine, Marijuana    Comment: last used two years ago   Sexual activity: Yes    Partners: Female  Other Topics Concern   Not on file  Social History Narrative   ** Merged History Encounter **       Pt lives at home alone. She has a HS education level and does not have any children.  She drinks 2 cups of caffeine daily.   Social Determinants of Health   Financial Resource Strain: Not on file  Food Insecurity: Not on file  Transportation Needs: Not on file  Physical Activity: Not on file  Stress: Not on file  Social Connections: Not on file    Sleep: Good  Appetite:  Good  Current Medications: Current Facility-Administered Medications  Medication Dose Route Frequency Provider Last Rate Last Admin   acetaminophen (TYLENOL) tablet 500 mg  500 mg Oral Q8H PRN Mason Jim, Sumner Kirchman E, MD   500 mg at 03/01/21 2152   albuterol (VENTOLIN HFA) 108 (90 Base) MCG/ACT inhaler 2 puff  2 puff Inhalation Q4H PRN Antonieta Pert, MD   2 puff at  02/27/21 0754   alum & mag hydroxide-simeth (MAALOX/MYLANTA) 200-200-20 MG/5ML suspension 30 mL  30 mL Oral Q4H PRN Antonieta Pert, MD   30 mL at 02/23/21 1736   amLODipine (NORVASC) tablet 5 mg  5 mg Oral Daily Antonieta Pert, MD   5 mg at 03/02/21 0735   atorvastatin (LIPITOR) tablet 40 mg  40 mg Oral Daily Antonieta Pert, MD   40 mg at 03/02/21 0736   bisacodyl (DULCOLAX) EC tablet 5 mg  5 mg Oral Daily PRN Carlyn Reichert, MD   5 mg at 03/02/21 1514   carbamazepine (TEGRETOL) chewable tablet 100 mg  100 mg Oral Daily  Comer LocketSingleton, Jamichael Knotts E, MD   100 mg at 03/02/21 [id number]   cloNIDine (CATAPRES) tablet 0.1 mg  0.1 mg Oral TID PRN Antonieta Pertlary, Greg Lawson, MD       diclofenac Sodium (VOLTAREN) 1 % topical gel 4 g  4 g Topical QID PRN Princess BruinsNguyen, Julie, DO       docusate sodium (COLACE) capsule 100 mg  100 mg Oral Daily Mason JimSingleton, Gauri Galvao E, MD   100 mg at 03/02/21 0737   DULoxetine (CYMBALTA) DR capsule 60 mg  60 mg Oral Daily Bartholomew CrewsSingleton, Kandee Escalante E, MD   60 mg at 03/02/21 0736   feeding supplement (ENSURE ENLIVE / ENSURE PLUS) liquid 237 mL  237 mL Oral BID BM Antonieta Pertlary, Greg Lawson, MD   237 mL at 03/01/21 1517   fluticasone (FLONASE) 50 MCG/ACT nasal spray 1 spray  1 spray Each Nare Daily Bobbitt, Shalon E, NP   1 spray at 03/02/21 0736   folic acid (FOLVITE) tablet 1 mg  1 mg Oral Daily Princess BruinsNguyen, Julie, DO   1 mg at 03/02/21 0736   gabapentin (NEURONTIN) capsule 100 mg  100 mg Oral TID Comer LocketSingleton, Wilhelmenia Addis E, MD   100 mg at 03/02/21 1202   haloperidol (HALDOL) tablet 5 mg  5 mg Oral BID Bartholomew CrewsSingleton, Fifi Schindler E, MD   5 mg at 03/02/21 0737   hydrOXYzine (ATARAX/VISTARIL) tablet 50 mg  50 mg Oral TID PRN Comer LocketSingleton, Ioma Chismar E, MD   50 mg at 03/01/21 1704   isosorbide mononitrate (IMDUR) 24 hr tablet 75 mg  75 mg Oral Daily Antonieta Pertlary, Greg Lawson, MD   75 mg at 03/02/21 0736   lidocaine (LIDODERM) 5 % 1 patch  1 patch Transdermal Daily PRN Comer LocketSingleton, Soloman Mckeithan E, MD       LORazepam (ATIVAN) tablet 1 mg  1 mg Oral Q6H PRN Antonieta Pertlary, Greg Lawson, MD    1 mg at 02/25/21 0136   magnesium hydroxide (MILK OF MAGNESIA) suspension 30 mL  30 mL Oral Daily PRN Princess BruinsNguyen, Julie, DO   30 mL at 02/28/21 1505   meloxicam (MOBIC) tablet 15 mg  15 mg Oral Daily Antonieta Pertlary, Greg Lawson, MD   15 mg at 03/02/21 0736   methocarbamol (ROBAXIN) tablet 750 mg  750 mg Oral Q8H PRN Antonieta Pertlary, Greg Lawson, MD   750 mg at 02/27/21 1602   metoprolol tartrate (LOPRESSOR) tablet 25 mg  25 mg Oral BID Antonieta Pertlary, Greg Lawson, MD   25 mg at 03/02/21 0736   pantoprazole (PROTONIX) EC tablet 40 mg  40 mg Oral q morning Antonieta Pertlary, Greg Lawson, MD   40 mg at 03/02/21 1200   psyllium (HYDROCIL/METAMUCIL) 1 packet  1 packet Oral Daily Princess BruinsNguyen, Julie, DO   1 packet at 02/27/21 1241   thiamine tablet 100 mg  100 mg Oral Daily Princess BruinsNguyen, Julie, DO   100 mg at 03/02/21 0736   traZODone (DESYREL) tablet 300 mg  300 mg Oral QHS PRN Princess BruinsNguyen, Julie, DO   300 mg at 03/01/21 2152   Vitamin D (Ergocalciferol) (DRISDOL) capsule 50,000 Units  50,000 Units Oral Q7 days Antonieta Pertlary, Greg Lawson, MD   50,000 Units at 03/01/21 1116    Lab Results:  Results for orders placed or performed during the hospital encounter of 02/21/21 (from the past 48 hour(s))  CBC with Differential/Platelet     Status: Abnormal   Collection Time: 03/01/21  6:47 AM  Result Value Ref Range   WBC 4.1 4.0 - 10.5 K/uL   RBC 4.11 3.87 - 5.11  MIL/uL   Hemoglobin 13.9 12.0 - 15.0 g/dL   HCT 01.7 51.0 - 25.8 %   MCV 101.9 (H) 80.0 - 100.0 fL   MCH 33.8 26.0 - 34.0 pg   MCHC 33.2 30.0 - 36.0 g/dL   RDW 52.7 78.2 - 42.3 %   Platelets 164 150 - 400 K/uL   nRBC 0.0 0.0 - 0.2 %   Neutrophils Relative % 36 %   Neutro Abs 1.5 (L) 1.7 - 7.7 K/uL   Lymphocytes Relative 49 %   Lymphs Abs 2.0 0.7 - 4.0 K/uL   Monocytes Relative 9 %   Monocytes Absolute 0.4 0.1 - 1.0 K/uL   Eosinophils Relative 5 %   Eosinophils Absolute 0.2 0.0 - 0.5 K/uL   Basophils Relative 1 %   Basophils Absolute 0.0 0.0 - 0.1 K/uL   Immature Granulocytes 0 %   Abs Immature  Granulocytes 0.01 0.00 - 0.07 K/uL    Comment: Performed at [facility], [address], [place], [place] [address]  Comprehensive metabolic panel     Status: Abnormal   Collection Time: 03/01/21  6:47 AM  Result Value Ref Range   Sodium 141 135 - 145 mmol/L   Potassium 4.4 3.5 - 5.1 mmol/L   Chloride 104 98 - 111 mmol/L   CO2 28 22 - 32 mmol/L   Glucose, Bld 92 70 - 99 mg/dL    Comment: Glucose reference range applies only to samples taken after fasting for at least 8 hours.   BUN 14 6 - 20 mg/dL   Creatinine, Ser 4.31 0.44 - 1.00 mg/dL   Calcium 9.5 8.9 - 54.0 mg/dL   Total Protein 7.1 6.5 - 8.1 g/dL   Albumin 4.1 3.5 - 5.0 g/dL   AST 37 15 - 41 U/L   ALT 53 (H) 0 - 44 U/L   Alkaline Phosphatase 78 38 - 126 U/L   Total Bilirubin 0.7 0.3 - 1.2 mg/dL   GFR, Estimated >08 >67 mL/min    Comment: (NOTE) Calculated using the CKD-EPI Creatinine Equation (2021)    Anion gap 9 5 - 15    Comment: Performed at [facility], [address], [place], [place] [address]    Blood Alcohol level:  Lab Results  Component Value Date   ETH 119 (H) 02/21/2021   ETH 47 (H) 01/25/2020    Metabolic Disorder Labs: Lab Results  Component Value Date   HGBA1C 4.8 02/21/2021   MPG 91.06 02/21/2021   MPG 76.71 08/26/2018   No results found for: PROLACTIN Lab Results  Component Value Date   CHOL 214 (H) 02/21/2021   TRIG 135 02/21/2021   HDL 58 02/21/2021   CHOLHDL 3.7 02/21/2021   VLDL 27 02/21/2021   LDLCALC 129 (H) 02/21/2021   LDLCALC 121 (H) 09/30/2017    Physical Findings: CIWA:  CIWA-Ar Total: 1 COWS:     Musculoskeletal: Strength & Muscle Tone: within normal limits Gait & Station: normal Patient leans: N/A  Psychiatric Specialty Exam: Physical Exam Constitutional:      Appearance: Normal appearance. She is obese.  HENT:     Head: Normocephalic.     Nose: Nose normal.  Eyes:     Conjunctiva/sclera: Conjunctivae normal.   Pulmonary:     Effort: Pulmonary effort is normal.  Musculoskeletal:     Cervical back: Normal range of motion.  Neurological:     Mental Status: She is alert and oriented to person, place, and time.  Review of Systems  Constitutional:  Positive for activity change (Improved, get out of bed and goes to group activites). Negative for appetite change and fatigue.  HENT:  Negative for congestion, rhinorrhea and sore throat.   Eyes:  Negative for visual disturbance.  Respiratory:  Negative for cough and shortness of breath.   Cardiovascular:  Negative for chest pain.  Gastrointestinal:  Negative for abdominal pain, constipation (Endorsed chronic constipation and typically have 2 bowel movement a week), nausea and vomiting.  Endocrine: Negative for cold intolerance and heat intolerance.  Genitourinary:  Negative for difficulty urinating (Reported that this is recurrent and has self-resolved in the past), dysuria, flank pain, urgency and vaginal pain.  Musculoskeletal:  Positive for back pain.  Neurological:  Negative for dizziness, tremors, seizures, light-headedness and headaches.  Psychiatric/Behavioral:  Negative for agitation, confusion, decreased concentration, hallucinations, sleep disturbance and suicidal ideas. The patient is not nervous/anxious and is not hyperactive.    Blood pressure 114/82, pulse 80, temperature 98 F (36.7 C), temperature source Oral, resp. rate 16, height 5\' 7"  (1.702 m), weight 87.1 kg, SpO2 100 %.Body mass index is 30.07 kg/m.  General Appearance: Casual and Fairly Groomed  Eye Contact:  Fair  Speech:  Clear and Coherent and Normal Rate  Volume:  Normal  Mood:  described as improving - appears calm  Affect:brighter appearing affect, less constricted  Thought Process:  Coherent and Linear  Orientation:  Full (Time, Place, and Person)  Thought Content:  Denied AVH, ideas of reference, paranoia, or first rank symptoms - no evidence of acute psychosis on exam   Suicidal Thoughts:  No  Homicidal Thoughts:  No  Memory:  Immediate;   Good Recent;   Good Remote;   Good  Judgement:  Fair  Insight:  Fair  Psychomotor Activity:  Normal - AIMS 0 and no tremor noted on exam, no cogwheeling or stiffness  Concentration:  Concentration: Good and Attention Span: Good  Recall:  Good  Fund of Knowledge:  Good  Language:  Good  Akathisia:  No  Handed:  Right  AIMS (if indicated):   0  Assets:  Communication Skills Desire for Improvement Housing Resilience  ADL's:  Intact  Cognition:  within normal limits  Sleep:  Number of Hours: 4.75    Treatment Plan Summary: Daily contact with patient to assess and evaluate symptoms and progress in treatment  , [phone] F who was brought to [facility] (02/21/2021) by [facility] police for SI with a plan. Past psychiatric history of bipolar disorder, cocaine use disorder vs cocaine dependence, alcohol use disorder vs alcohol dependence.  Today patient continued exhibited a more cheerful affect. Denied SI/HI/AVH. Slept poorly last night at 4.75 hours. Received Trazodone 300 mg prn. Will CTM and consider addition of Vistaril qhs.  Discontinuing Tegretol due to elevating ALT (53) and decreasing ANC (1.5).Tegretol 100mg  x2 days then stop with and last dose today.   Continue gabapentin 100 TID for chronic pain and mood stability Sent urine for culture, pending results.   BIPOLAR - Cont Cymbalta 60mg  daily - Tapering off Carbamazepine - reduce to 100mg  PO daily for 2 days then stop   Elevating ALT (53) and decreasing ANC (1.5) - rechecking CBC and CMP Monday for trending - Cont haldol 5mg  BID for AVH and mood stabilization (r/b/se/a to typical antipsychotic reviewed with patient and she requests to restart this as a home medication) - Cont trazodone 300mg  qHS PRN insomnia  ALCOHOL USE D/O AND  STIMULANT USE D/O - COCAINE TYPE - Continue Neurontin  tid to help with pain  and potential cravings, and mood  - Cont CIWA protocol monitoring with Ativan PO   PRN for CIWA score >10 with MVI and thiamine oral replacement  - Patient accepted to [facility] for next week  BACK PAIN - Cont Robaxin  PO 3 times daily as needed for pain - Changed acetaminophen tablet  q6HR to  q8 hours PRN due to worsening ALT - Cont Mobic  PO daily for osteoarthritic pain - Cont diclofenac sodium 1% topical gel 4 times daily for pain to PRN - Cont lidoderm 5% patches daily for pain to PRN - Continue Cymbalta  daily for mood and pain  - Continue neurontin  TID  ANXIETY - Cont hydroxyzine tablet  PO 3 times daily PRN for anxiety   NUTRITION - Cont Vitamin D Drisdol 50,000 units PO  - Cont feeding supplement (ENSURE) PO 2 times daily between meals - Cont Folic acid and thiamine   CHEST PAIN - Cont Imdur  24hour tablet PO daily for chest pain   HYPERTENSION - Cont amlodipine  PO daily for hypertension and chest pain - Cont clonidine 0.1mg  PO 3 times daily as needed for systolic blood pressure greater than 160 - Cont Lopressor  PO twice daily for health rate and hypertension   HYPERLIPIDEMIA - Cont Lipitor  PO daily for hyperlipidemia   GERD - Cont alum & mag hydroxide-simeth 30ml PO every 4hrs PRN - Cont protonix  daily  PERCEIVED URINARY RETENTION- resolved - Bladder scan negative for retention and symptoms improved after bowel movement - UA shows trace leukocytes, rare bacteria, 30 protein, 5 ketones - urine culture pending   CONSTIPATION - Cont magnesium hydroxide 30ml PO daily for mild constipation - Continue Colace  daily and Metamucil daily - Dulcolax 5 mg added 7/10   ASTHMA - Cont albuterol as needed for wheezing and shortness of breath   NASAL CONGESTION - Cont fluticasone 56mcg/act nasal spray daily   AGITATION - Cont Risperdal agitation protocol as needed - Cont Geodon agitation  protocol as needed   Carlyn Reichert, PGY-1

## 2021-03-02 NOTE — Progress Notes (Signed)
   03/02/21 1700  Psych Admission Type (Psych Patients Only)  Admission Status Voluntary  Psychosocial Assessment  Eye Contact Fair  Facial Expression Animated;Anxious  Affect Anxious  Speech Logical/coherent  Interaction Assertive  Motor Activity Slow  Appearance/Hygiene Unremarkable  Thought Process  Coherency WDL  Content WDL  Delusions None reported or observed  Perception WDL  Hallucination None reported or observed  Judgment Poor  Confusion None  Danger to Self  Current suicidal ideation? Denies  Self-Injurious Behavior No self-injurious ideation or behavior indicators observed or expressed   Agreement Not to Harm Self No  Description of Agreement verbal agreement to contract for safety  Danger to Others  Danger to Others None reported or observed

## 2021-03-02 NOTE — Progress Notes (Addendum)
Pt BP elevated, 143/83.  Pt provided and encouraged to hydrate more.    03/01/21 2152  Psych Admission Type (Psych Patients Only)  Admission Status Voluntary  Psychosocial Assessment  Patient Complaints Anxiety;Depression  Eye Contact Fair  Facial Expression Animated;Anxious  Affect Anxious  Speech Logical/coherent  Interaction Assertive  Motor Activity Slow  Appearance/Hygiene Unremarkable  Behavior Characteristics Cooperative;Calm  Mood Anxious;Pleasant  Thought Process  Coherency WDL  Content WDL  Delusions None reported or observed  Perception WDL  Hallucination None reported or observed  Judgment Poor  Confusion None  Danger to Self  Current suicidal ideation? Denies  Self-Injurious Behavior No self-injurious ideation or behavior indicators observed or expressed   Agreement Not to Harm Self No  Description of Agreement verbal agreement to contract for safety  Danger to Others  Danger to Others None reported or observed

## 2021-03-02 NOTE — [facility] Group Notes (Signed)
LCSW Group Therapy Note   Type of Therapy and Topic:  Group Therapy - Healthy vs Unhealthy Coping Skills  Participation Level:  Did Not Attend  Description of Group The focus of this group was to determine what unhealthy coping techniques typically are used by group members and what healthy coping techniques would be helpful in coping with various problems. Patients were guided in becoming aware of the differences between healthy and unhealthy coping techniques. Patients were asked to identify 2-3 healthy coping skills they would like to learn to use more effectively.  Therapeutic Goals 1. Patients learned that coping is what human beings do all day long to deal with various situations in their lives 2. Patients defined and discussed healthy vs unhealthy coping techniques 3. Patients identified their preferred coping techniques and identified whether these were healthy or unhealthy 4. Patients determined 2-3 healthy coping skills they would like to become more familiar with and use more often. 5. Patients provided support and ideas to each other   Therapeutic Modalities Cognitive Behavioral Therapy Motivational Interviewing  Jonathen Rathman M Heavin Sebree, LCSWA 03/02/2021  11:01 AM    

## 2021-03-03 LAB — CBC WITH DIFFERENTIAL/PLATELET
Abs Immature Granulocytes: 0 10*3/uL (ref 0.00–0.07)
Abs Immature Granulocytes: 0.01 10*3/uL (ref 0.00–0.07)
Basophils Absolute: 0.1 10*3/uL (ref 0.0–0.1)
Basophils Absolute: 0 10*3/uL (ref 0.0–0.1)
Basophils Relative: 1 %
Basophils Relative: 1 %
Eosinophils Absolute: 0.2 10*3/uL (ref 0.0–0.5)
Eosinophils Absolute: 0.2 10*3/uL (ref 0.0–0.5)
Eosinophils Relative: 5 %
Eosinophils Relative: 4 %
HCT: 40.6 % (ref 36.0–46.0)
HCT: 39.1 % (ref 36.0–46.0)
Hemoglobin: 13.2 g/dL (ref 12.0–15.0)
Hemoglobin: 12.8 g/dL (ref 12.0–15.0)
Immature Granulocytes: 0 %
Immature Granulocytes: 0 %
Lymphocytes Relative: 46 %
Lymphocytes Relative: 42 %
Lymphs Abs: 1.9 10*3/uL (ref 0.7–4.0)
Lymphs Abs: 1.8 10*3/uL (ref 0.7–4.0)
MCH: 33.5 pg (ref 26.0–34.0)
MCH: 33.7 pg (ref 26.0–34.0)
MCHC: 32.5 g/dL (ref 30.0–36.0)
MCHC: 32.7 g/dL (ref 30.0–36.0)
MCV: 103 fL — ABNORMAL HIGH (ref 80.0–100.0)
MCV: 102.9 fL — ABNORMAL HIGH (ref 80.0–100.0)
Monocytes Absolute: 0.5 10*3/uL (ref 0.1–1.0)
Monocytes Absolute: 0.5 10*3/uL (ref 0.1–1.0)
Monocytes Relative: 12 %
Monocytes Relative: 11 %
Neutro Abs: 1.4 10*3/uL — ABNORMAL LOW (ref 1.7–7.7)
Neutro Abs: 1.8 10*3/uL (ref 1.7–7.7)
Neutrophils Relative %: 36 %
Neutrophils Relative %: 42 %
Platelets: 170 10*3/uL (ref 150–400)
Platelets: 167 10*3/uL (ref 150–400)
RBC: 3.94 MIL/uL (ref 3.87–5.11)
RBC: 3.8 MIL/uL — ABNORMAL LOW (ref 3.87–5.11)
RDW: 12.9 % (ref 11.5–15.5)
RDW: 12.9 % (ref 11.5–15.5)
WBC: 4 10*3/uL (ref 4.0–10.5)
WBC: 4.2 10*3/uL (ref 4.0–10.5)
nRBC: 0 % (ref 0.0–0.2)
nRBC: 0 % (ref 0.0–0.2)

## 2021-03-03 LAB — COMPREHENSIVE METABOLIC PANEL
ALT: 52 U/L — ABNORMAL HIGH (ref 0–44)
AST: 33 U/L (ref 15–41)
Albumin: 4.2 g/dL (ref 3.5–5.0)
Alkaline Phosphatase: 80 U/L (ref 38–126)
Anion gap: 6 (ref 5–15)
BUN: 15 mg/dL (ref 6–20)
CO2: 30 mmol/L (ref 22–32)
Calcium: 9.4 mg/dL (ref 8.9–10.3)
Chloride: 105 mmol/L (ref 98–111)
Creatinine, Ser: 0.95 mg/dL (ref 0.44–1.00)
GFR, Estimated: >60 mL/min (ref >60)
Glucose, Bld: 105 mg/dL — ABNORMAL HIGH (ref 70–99)
Potassium: 4.2 mmol/L (ref 3.5–5.1)
Sodium: 141 mmol/L (ref 135–145)
Total Bilirubin: 0.5 mg/dL (ref 0.3–1.2)
Total Protein: 7 g/dL (ref 6.5–8.1)

## 2021-03-03 MED ORDER — BENZTROPINE MESYLATE 0.5 MG PO TABS: 0.5000 mg | ORAL_TABLET | Freq: Two times a day (BID) | ORAL | Status: DC | PRN

## 2021-03-03 MED ORDER — BENZTROPINE MESYLATE 0.5 MG PO TABS: 0.5000 mg | ORAL_TABLET | Freq: Two times a day (BID) | ORAL | 0 refills | Status: DC | PRN

## 2021-03-03 MED ORDER — HALOPERIDOL 5 MG PO TABS: 5.0000 mg | ORAL_TABLET | Freq: Two times a day (BID) | ORAL | 0 refills | Status: DC

## 2021-03-03 MED ORDER — GABAPENTIN 100 MG PO CAPS: 100.0000 mg | ORAL_CAPSULE | Freq: Three times a day (TID) | ORAL | 0 refills | Status: DC

## 2021-03-03 MED ORDER — ISOSORBIDE MONONITRATE ER 30 MG PO TB24: 80.0000 mg | ORAL_TABLET | Freq: Every day | ORAL | 0 refills | Status: DC

## 2021-03-03 MED ORDER — METOPROLOL TARTRATE 25 MG PO TABS: 25.0000 mg | ORAL_TABLET | Freq: Two times a day (BID) | ORAL | 0 refills | Status: DC

## 2021-03-03 MED ORDER — DULOXETINE HCL 60 MG PO CPEP: 60.0000 mg | ORAL_CAPSULE | Freq: Every day | ORAL | 0 refills | Status: DC

## 2021-03-03 NOTE — Plan of Care (Signed)
Nurse discussed anxiety, depression and coping skills with patient.  

## 2021-03-03 NOTE — Discharge Summary (Signed)
Physician Discharge Summary Note  Patient:  Amanda [name] is an 55 y.o., female MRN:  [id number] DOB:  Feb 02, 1966 Patient phone:  [phone] (home)  Patient address:   [address] [place] [address],  Total Time spent with patient: 30 minutes  Date of Admission:  02/21/2021 Date of Discharge: 03/04/2021  Reason for Admission:  Amanda [name], 45NG F who was brought to [facility] (02/21/2021) by [facility] police for SI with a plan. Past psychiatric history of bipolar disorder, cocaine use disorder vs cocaine dependence, alcohol use disorder vs alcohol dependence.  " This morning the patient stated that she held a knife to her chest with the intent to commit suicide, but stopped and called [employer] because she was afraid to die. Patient stated to this Thereasa Parkin that this was her first suicide attempt, but has had multiple SI for the last 6 months - 1 year. Stated that she has been depressed for 6-7 years. Patient was vague about any inciting event that may has led up to her worsening depression. Stated that she has had increased feeling on hopelessness and that there is nothing else to live for. She stated that she has not been caring for her hygiene, but that she wants to. Also stated that she missed her last outpatient psych appointment (02/20/2021) and that she is not med compliant, due to her depression.   Patient has admitted to AVH "for years", stating that people are talking about her and that people are coming in and out of her closet and apartment to do drugs which makes her feel unsafe. Stated that the AVH occurs during substance use. She currently is not having AVH.   Patient stated that she abuses alcohol and crack cocaine daily for the last 7-8 months. Stated that she needs a drink in the morning and has developed tremors if she doesn't drink. Stated that she smokes marijuana once in a while.   Currently patient lives alone in an apartment, is  unemployed and on disability. Patient stated that she talks to her family weekly, and that it was her brother that encouraged her to seek help. Patient stated that we can call her sister: Amanda [name] [phone] Patient stated that she has no significant other, no children. Patient denied any past physical, emotional, sexual trauma. Patient denied any family psychiatric history, suicide, or substance abuse. "  Principal Problem: Bipolar I disorder, most recent episode depressed, severe w psychosis (HCC) Discharge Diagnoses: Principal Problem:   Bipolar I disorder, most recent episode depressed, severe w psychosis (HCC) Active Problems:   Cocaine abuse (HCC)   Alcohol abuse   Past Psychiatric History:  Past psychiatric hospitalization: [facility] (11/16/2014 - 11/21/2014) Past suicide attempts: 7 attempts over past 6 months   Patient stated that she attends [facility] for medical and pain, psychiatric management.   Past psych meds: Welbutrin, hydroxyzine, haldol, xanax  Past Medical History:  Past Medical History:  Diagnosis Date   Anxiety    Arthritis    lower back   Asthma    Bipolar disorder (HCC)    Carpal tunnel syndrome, bilateral    Chronic back pain    Depression    Diverticulosis    Fx. left wrist    GERD (gastroesophageal reflux disease)    Heart murmur    "born with"   Heart murmur    HLD (hyperlipidemia)    Hypertension    IBS (irritable bowel syndrome)    Internal hemorrhoids  Muscle spasms of neck    Neuropathy    PUD (peptic ulcer disease)     Past Surgical History:  Procedure Laterality Date   ARTHRODESIS METATARSALPHALANGEAL JOINT (MTPJ) Right 04/29/2016   Procedure: RIGHT GREAT TOE METATARSOPHALANGEAL JOINT (MTPJ) FUSION, HARDWARE REMOVAL;  Surgeon: Tarry Kos, MD;  Location: [facility];  Service: Orthopedics;  Laterality: Right;  RIGHT GREAT TOE METATARSOPHALANGEAL JOINT (MTPJ) FUSION, HARDWARE REMOVAL   CARPAL TUNNEL RELEASE  Right 2003   CHOLECYSTECTOMY N/A 02/09/2014   Procedure: LAPAROSCOPIC CHOLECYSTECTOMY WITH INTRAOPERATIVE CHOLANGIOGRAM;  Surgeon: Valarie Merino, MD;  Location: [facility];  Service: General;  Laterality: N/A;   CHOLECYSTECTOMY     FOOT SURGERY     HAMMER TOE FUSION Bilateral 2008   HARDWARE REMOVAL Right 04/29/2016   Procedure: HARDWARE REMOVAL;  Surgeon: Tarry Kos, MD;  Location:  SURGERY CENTER;  Service: Orthopedics;  Laterality: Right;  HARDWARE REMOVAL   LEFT HEART CATH AND CORONARY ANGIOGRAPHY N/A 08/30/2018   Procedure: LEFT HEART CATH AND CORONARY ANGIOGRAPHY;  Surgeon: Marykay Lex, MD;  Location: [facility]ASIVE CV [facility];  Service: Cardiovascular;  Laterality: N/A;   ROTATOR CUFF REPAIR Right 2011   WRIST FRACTURE SURGERY Left    Family History:  Family History  Problem Relation Age of Onset   Pancreatic cancer Mother    CAD Father    Diabetes Mellitus II Father    Heart failure Father    Diabetes Father    Breast cancer Paternal Grandmother    HIV/AIDS Brother    Other Sister        Brain tumor   Colon cancer Neg Hx    Family Psychiatric  History: Patient denied any family psychiatric history, suicide, or substance abuse. Social History:  Social History   Substance and Sexual Activity  Alcohol Use Yes   Comment: heavily daily     Social History   Substance and Sexual Activity  Drug Use Yes   Types: Cocaine, "Crack" cocaine, Marijuana   Comment: last used two years ago    Social History   Socioeconomic History   Marital status: Single    Spouse name: Not on file   Number of children: 0   Years of education: 12   Highest education level: Not on file  Occupational History   Occupation: Cook    Comment: [employer]  Tobacco Use   Smoking status: Light Smoker    Pack years: 0.00    Types: Cigarettes   Smokeless tobacco: Never   Tobacco comments:    "takes a puff sometimes"  Substance and Sexual Activity   Alcohol use: Yes    Comment: heavily daily    Drug use: Yes    Types: Cocaine, "Crack" cocaine, Marijuana    Comment: last used two years ago   Sexual activity: Yes    Partners: Female  Other Topics Concern   Not on file  Social History Narrative   ** Merged History Encounter **       Pt lives at home alone. She has a HS education level and does not have any children.  She drinks 2 cups of caffeine daily.   Social Determinants of Health   Financial Resource Strain: Not on file  Food Insecurity: Not on file  Transportation Needs: Not on file  Physical Activity: Not on file  Stress: Not on file  Social Connections: Not on file    Hospital Course:  Amanda [name], 15XW F who was brought  to [facility] (02/21/2021) by [facility] police for SI with a plan. Past psychiatric history of bipolar disorder, cocaine use disorder vs cocaine dependence, alcohol use disorder vs alcohol dependence.   Depression - Patient's home regiment included Wellbutrin short acting, which was changed to Wellbutrin  PO daily for depression. Which was titrated up to  PO daily then discontinued due to risk of seizure for history of alcohol abuse. Added Cymbalta  daily, which was increased to Cymbalta  daily.  Bipolar Disorder - Patient was started on Carbamazepine   PO BID, which was tapered and discontinued due to elevating liver enzymes and decreasing ANC.  - Added neurontin  TID for mood stability, alcohol cravings and neuropathic pain.   Psychosis - Patient's home regiment was Haldol  BID, but patient was started on Haldol  BID.  Insomnia - Patient was started on Trazadone  PO qHS which was ineffective, so it was discontinued. Started Doxepin  PO qHS which was increased to  PO qHS, then discontinued. Trazadone  PO qHS was added as PRN, which patient has requested every night.    Physical Findings: AIMS: Facial and Oral Movements Muscles of Facial Expression:  None, normal Lips and Perioral Area: None, normal Jaw: None, normal Tongue: None, normal,Extremity Movements Upper (arms, wrists, hands, fingers): None, normal Lower (legs, knees, ankles, toes): None, normal, Trunk Movements Neck, shoulders, hips: None, normal, Overall Severity Severity of abnormal movements (highest score from questions above): None, normal Incapacitation due to abnormal movements: None, normal Patient's awareness of abnormal movements (rate only patient's report): No Awareness, Dental Status Current problems with teeth and/or dentures?: No Does patient usually wear dentures?: No  CIWA:  CIWA-Ar Total: 1 COWS:     Psychiatric Specialty Exam: Physical Exam Constitutional:      Appearance: Normal appearance. She is obese.  HENT:     Head: Normocephalic.     Nose: Nose normal.  Eyes:     Extraocular Movements: Extraocular movements intact.     Conjunctiva/sclera: Conjunctivae normal.  Pulmonary:     Effort: Pulmonary effort is normal.  Musculoskeletal:        General: Normal range of motion.     Cervical back: Normal range of motion.  Neurological:     Mental Status: She is alert and oriented to person, place, and time.    Review of Systems  Constitutional:  Negative for diaphoresis and fever.  HENT:  Negative for congestion, rhinorrhea and sore throat.   Eyes:  Negative for visual disturbance.  Respiratory:  Negative for shortness of breath and wheezing.   Cardiovascular:  Positive for chest pain (Chronic, intermittent). Negative for palpitations and leg swelling.  Gastrointestinal:  Negative for abdominal pain, constipation, diarrhea, nausea and vomiting.  Genitourinary:  Negative for difficulty urinating, dysuria, flank pain, vaginal discharge and vaginal pain.  Musculoskeletal:  Positive for back pain (Chronic).  Neurological:  Negative for dizziness, tremors, weakness, light-headedness and headaches.  Psychiatric/Behavioral:  Negative for agitation,  confusion, decreased concentration, dysphoric mood, hallucinations and suicidal ideas. The patient is nervous/anxious (Nervous about discharge day). The patient is not hyperactive.    Blood pressure (!) 131/95, pulse 79, temperature 98 F (36.7 C), temperature source Oral, resp. rate 18, height  (1.702 m), weight 87.1 kg, SpO2 100 %.Body mass index is 30.07 kg/m.  General Appearance: Casual and Fairly Groomed  Eye Contact:  Good  Speech:  Clear and Coherent and Normal Rate  Volume:  Normal  Mood:  Anxious  Affect:  Appropriate and Congruent  Thought Process:  Coherent and Linear  Orientation:  Full (Time, Place, and Person)  Thought Content:  Logical  denied AVH  Suicidal Thoughts:  No  Homicidal Thoughts:  No  Memory:  Immediate;   Good Recent;   Good Remote;   Good  Judgement:  Poor  Insight:  Lacking  Psychomotor Activity:  Normal  Concentration:  Concentration: Fair and Attention Span: Fair  Recall:  Good  Fund of Knowledge:  Fair  Language:  Good  Akathisia:  No  Handed:  Right  AIMS (if indicated):     Assets:  Communication Skills Desire for Improvement Resilience  ADL's:  Intact  Cognition:  WNL  Sleep:  Number of Hours: 4    Blood pressure (!) 131/95, pulse 79, temperature 98 F (36.7 C), temperature source Oral, resp. rate 18, height 5\' 7"  (1.702 m), weight 87.1 kg, SpO2 100 %. Body mass index is 30.07 kg/m.   Social History   Tobacco Use  Smoking Status Light Smoker   Pack years: 0.00   Types: Cigarettes  Smokeless Tobacco Never  Tobacco Comments   "takes a puff sometimes"   Tobacco Cessation:  N/A, patient does not currently use tobacco products   Blood Alcohol level:  Lab Results  Component Value Date   ETH 119 (H) 02/21/2021   ETH 47 (H) 01/25/2020    Metabolic Disorder Labs:  Lab Results  Component Value Date   HGBA1C 4.8 02/21/2021   MPG 91.06 02/21/2021   MPG 76.71 08/26/2018   No results found for: PROLACTIN Lab Results   Component Value Date   CHOL 214 (H) 02/21/2021   TRIG 135 02/21/2021   HDL 58 02/21/2021   CHOLHDL 3.7 02/21/2021   VLDL 27 02/21/2021   LDLCALC 129 (H) 02/21/2021   LDLCALC 121 (H) 09/30/2017    See Psychiatric Specialty Exam and Suicide Risk Assessment completed by Attending Physician prior to discharge.  Discharge destination:  Other:  [facility]  Is patient on multiple antipsychotic therapies at discharge:  No   Has Patient had three or more failed trials of antipsychotic monotherapy by history:  No  Recommended Plan for Multiple Antipsychotic Therapies: NA   Allergies as of 03/03/2021       Reactions   Penicillins Anaphylaxis   Has patient had a PCN reaction causing immediate rash, facial/tongue/throat swelling, SOB or lightheadedness with hypotension: Yes Has patient had a PCN reaction causing severe rash involving mucus membranes or skin necrosis: Yes Has patient had a PCN reaction that required hospitalization Yes Has patient had a PCN reaction occurring within the last 10 years: No-more than 10 years ago If all of the above answers are "NO", then may proceed with Cephalosporin use.   Penicillins Anaphylaxis   DID THE REACTION INVOLVE: Swelling of the face/tongue/throat, SOB, or low BP? Yes Sudden or severe rash/hives, skin peeling, or the inside of the mouth or nose? Yes Did it require medical treatment? No When did it last happen? Within the past 10 years If all above answers are "NO", may proceed with cephalosporin use.   Asa [aspirin] Rash   Aspirin Rash        Medication List     STOP taking these medications    buPROPion 300 MG 24 hr tablet Commonly known as: WELLBUTRIN XL   hydrOXYzine 25 MG tablet Commonly known as: ATARAX/VISTARIL   trazodone 300 MG tablet Commonly known as: DESYREL  TAKE these medications      Indication  albuterol 108 (90 Base) MCG/ACT inhaler Commonly known as: Ventolin HFA Inhale 2 puffs into  the lungs every 4 (four) hours as needed for wheezing or shortness of breath.  Indication: Asthma   amLODipine 5 MG tablet Commonly known as: NORVASC Take 1 tablet (5 mg total) by mouth daily.  Indication: CORONARY ARTERY DISEASE, High Blood Pressure Disorder   atorvastatin 40 MG tablet Commonly known as: LIPITOR Take 1 tablet (40 mg total) by mouth daily.  Indication: High Amount of Fats in the Blood   benztropine 0.5 MG tablet Commonly known as: COGENTIN Take 1 tablet (0.5 mg total) by mouth 2 (two) times daily as needed for tremors (EPS).  Indication: Extrapyramidal Reaction caused by Medications   DULoxetine 60 MG capsule Commonly known as: CYMBALTA Take 1 capsule (60 mg total) by mouth daily. Start taking on: March 04, 2021  Indication: Major Depressive Disorder, Musculoskeletal Pain   gabapentin 100 MG capsule Commonly known as: NEURONTIN Take 1 capsule (100 mg total) by mouth 3 (three) times daily.  Indication: Abuse or Misuse of Alcohol, Disease of the Peripheral Nerves   haloperidol 5 MG tablet Commonly known as: HALDOL Take 1 tablet (5 mg total) by mouth 2 (two) times daily.  Indication: Manic Phase of Manic-Depression, Psychosis   isosorbide mononitrate 30 MG 24 hr tablet Commonly known as: IMDUR Take 2.5 tablets (75 mg total) by mouth daily. Start taking on: March 04, 2021 What changed:  medication strength how much to take Another medication with the same name was removed. Continue taking this medication, and follow the directions you see here.  Indication: Stable Angina Pectoris   metoprolol tartrate 25 MG tablet Commonly known as: LOPRESSOR Take 1 tablet (25 mg total) by mouth 2 (two) times daily. What changed:  medication strength how much to take  Indication: High Blood Pressure Disorder   pantoprazole 40 MG tablet Commonly known as: PROTONIX Take 40 mg by mouth every morning.  Indication: Gastroesophageal Reflux Disease        Follow-up  Information     [facility].   Why: You have been accepted to this facility for substance use treatment on 03/04/21. Contact information: [address] [place] [address] [phone]                 Follow-up recommendations:   - Activity as tolerated. - Diet as recommended by PCP. - Keep all scheduled follow-up appointments as recommended.  Patient is instructed to take all prescribed medications as recommended. Report any side effects or adverse reactions to your outpatient psychiatrist. Patient is instructed to abstain from alcohol and illegal drugs while on prescription medications. In the event of worsening symptoms, patient is instructed to call the crisis hotline, 911, or go to the nearest emergency department for evaluation and treatment.  Comments:  Prescriptions given at discharge. Patient agreeable to plan. Given opportunity to ask questions. Appears to feel comfortable with discharge denies any current suicidal or homicidal thought.  Patient is also instructed prior to discharge to: Take all medications as prescribed by mental healthcare provider. Report any adverse effects and or reactions from the medicines to outpatient provider promptly. Patient has been instructed & cautioned: To not engage in alcohol and or illegal drug use while on prescription medicines. In the event of worsening symptoms,  patient is instructed to call the crisis hotline, 911 and or go to the nearest ED for appropriate  evaluation and treatment of symptoms. To follow-up with primary care provider for other medical issues, concerns and or health care needs  The patient was evaluated each day by a clinical provider to ascertain response to treatment. Improvement was noted by the patient's report of decreasing symptoms, improved sleep and appetite, affect, medication tolerance, behavior, and participation in unit programming.  Patient was asked each day to complete a self  inventory noting mood, mental status, pain, new symptoms, anxiety and concerns.  Patient responded well to medication and being in a therapeutic and supportive environment. Positive and appropriate behavior was noted and the patient was motivated for recovery. The patient worked closely with the treatment team and case manager to develop a discharge plan with appropriate goals. Coping skills, problem solving as well as relaxation therapies were also part of the unit programming.  By the day of discharge patient was in much improved condition than upon admission.  Symptoms were reported as significantly decreased or resolved completely. The patient denied SI/HI and voiced no AVH. The patient was motivated to continue taking medication with a goal of continued improvement in mental health.   Patient was discharged home with a plan to follow up as noted below.   Signed: Princess Bruins, DO 03/03/2021, 6:06 PM

## 2021-03-03 NOTE — Progress Notes (Addendum)
[facility] MD Progress Note  03/03/2021 12:40 PM Amanda [name]  MRN:  [id number] Subjective:  [name], [phone] F who was brought to [facility] (02/21/2021) by [facility] police for SI with a plan. Past psychiatric history of bipolar disorder, cocaine use disorder vs cocaine dependence, alcohol use disorder vs alcohol dependence. [facility] stay day 10.   Last night:  Patient slept Number of Hours: 4. Patient stated that she did not  Patient did attend PM group activities. Patient was compliant with scheduled medications.   Today (03/03/2021): Vital signs are within normal limits. Patient was laying in bed, is anxious about discharge, and cooperative with interview. Patient denied SI/HI/VH, reported to hearing music in her head that is not new and is intermittent.  Patient reports sleeping poorly due to being nervous about discharge. Patient stated that she is able to have a bowel movement and no issues voiding today. Patient denied medication side effects.  Principal Problem: Bipolar I disorder, most recent episode depressed, severe w psychosis (HCC) Diagnosis: Principal Problem:   Bipolar I disorder, most recent episode depressed, severe w psychosis (HCC) Active Problems:   Cocaine abuse (HCC)   Alcohol abuse  Total Time spent with patient: 25 minutes  Past Psychiatric History:  Past psychiatric history of bipolar disorder, cocaine use disorder vs cocaine dependence, alcohol use disorder vs alcohol dependence.   Past psychiatric hospitalization: [facility] (11/16/2014 - 11/21/2014) for SI without a plan Past suicide attempts: reported 7 attempts over past 6 months in chart review  Past Medical History:  Past Medical History:  Diagnosis Date   Anxiety    Arthritis    lower back   Asthma    Bipolar disorder (HCC)    Carpal tunnel syndrome, bilateral    Chronic back pain    Depression    Diverticulosis    Fx. left wrist    GERD (gastroesophageal reflux  disease)    Heart murmur    "born with"   Heart murmur    HLD (hyperlipidemia)    Hypertension    IBS (irritable bowel syndrome)    Internal hemorrhoids    Muscle spasms of neck    Neuropathy    PUD (peptic ulcer disease)     Past Surgical History:  Procedure Laterality Date   ARTHRODESIS METATARSALPHALANGEAL JOINT (MTPJ) Right 04/29/2016   Procedure: RIGHT GREAT TOE METATARSOPHALANGEAL JOINT (MTPJ) FUSION, HARDWARE REMOVAL;  Surgeon: Tarry Kos, MD;  Location: [facility];  Service: Orthopedics;  Laterality: Right;  RIGHT GREAT TOE METATARSOPHALANGEAL JOINT (MTPJ) FUSION, HARDWARE REMOVAL   CARPAL TUNNEL RELEASE Right 2003   CHOLECYSTECTOMY N/A 02/09/2014   Procedure: LAPAROSCOPIC CHOLECYSTECTOMY WITH INTRAOPERATIVE CHOLANGIOGRAM;  Surgeon: Valarie Merino, MD;  Location: [facility];  Service: General;  Laterality: N/A;   CHOLECYSTECTOMY     FOOT SURGERY     HAMMER TOE FUSION Bilateral 2008   HARDWARE REMOVAL Right 04/29/2016   Procedure: HARDWARE REMOVAL;  Surgeon: Tarry Kos, MD;  Location: [facility];  Service: Orthopedics;  Laterality: Right;  HARDWARE REMOVAL   LEFT HEART CATH AND CORONARY ANGIOGRAPHY N/A 08/30/2018   Procedure: LEFT HEART CATH AND CORONARY ANGIOGRAPHY;  Surgeon: Marykay Lex, MD;  Location: [facility] INVASIVE CV LAB;  Service: Cardiovascular;  Laterality: N/A;   ROTATOR CUFF REPAIR Right 2011   WRIST FRACTURE SURGERY Left    Family History:  Family History  Problem Relation Age of Onset   Pancreatic cancer Mother  CAD Father    Diabetes Mellitus II Father    Heart failure Father    Diabetes Father    Breast cancer Paternal Grandmother    HIV/AIDS Brother    Other Sister        Brain tumor   Colon cancer Neg Hx    Family Psychiatric  History: Patient denied any family psychiatric history, suicide, or substance abuse. Social History:  Social History   Substance and Sexual Activity  Alcohol Use Yes   Comment: heavily daily      Social History   Substance and Sexual Activity  Drug Use Yes   Types: Cocaine, "Crack" cocaine, Marijuana   Comment: last used two years ago    Social History   Socioeconomic History   Marital status: Single    Spouse name: Not on file   Number of children: 0   Years of education: 12   Highest education level: Not on file  Occupational History   Occupation: Cook    Comment: [employer]  Tobacco Use   Smoking status: Light Smoker    Pack years: 0.00    Types: Cigarettes   Smokeless tobacco: Never   Tobacco comments:    "takes a puff sometimes"  Substance and Sexual Activity   Alcohol use: Yes    Comment: heavily daily   Drug use: Yes    Types: Cocaine, "Crack" cocaine, Marijuana    Comment: last used two years ago   Sexual activity: Yes    Partners: Female  Other Topics Concern   Not on file  Social History Narrative   ** Merged History Encounter **       Pt lives at home alone. She has a HS education level and does not have any children.  She drinks 2 cups of caffeine daily.   Social Determinants of Health   Financial Resource Strain: Not on file  Food Insecurity: Not on file  Transportation Needs: Not on file  Physical Activity: Not on file  Stress: Not on file  Social Connections: Not on file    Sleep: Good  Appetite:  Good  Current Medications: Current Facility-Administered Medications  Medication Dose Route Frequency Provider Last Rate Last Admin   acetaminophen (TYLENOL) tablet 500 mg  500 mg Oral Q8H PRN Mason Jim, Melodee Lupe E, MD   500 mg at 03/02/21 2100   albuterol (VENTOLIN HFA) 108 (90 Base) MCG/ACT inhaler 2 puff  2 puff Inhalation Q4H PRN Antonieta Pert, MD   2 puff at 02/27/21 0754   alum & mag hydroxide-simeth (MAALOX/MYLANTA) 200-200-20 MG/5ML suspension 30 mL  30 mL Oral Q4H PRN Antonieta Pert, MD   30 mL at 02/23/21 1736   amLODipine (NORVASC) tablet 5 mg  5 mg Oral Daily Antonieta Pert, MD   5 mg at 03/03/21 0826   atorvastatin  (LIPITOR) tablet 40 mg  40 mg Oral Daily Antonieta Pert, MD   40 mg at 03/03/21 0825   bisacodyl (DULCOLAX) EC tablet 5 mg  5 mg Oral Daily PRN Carlyn Reichert, MD   5 mg at 03/02/21 1514   cloNIDine (CATAPRES) tablet 0.1 mg  0.1 mg Oral TID PRN Antonieta Pert, MD       diclofenac Sodium (VOLTAREN) 1 % topical gel 4 g  4 g Topical QID PRN Princess Bruins, DO       docusate sodium (COLACE) capsule 100 mg  100 mg Oral Daily Mason Jim, Madeline Bebout E, MD   100 mg at 03/03/21 0825  DULoxetine (CYMBALTA) DR capsule 60 mg  60 mg Oral Daily Mason Jim, Mailee Klaas E, MD   60 mg at 03/03/21 0825   feeding supplement (ENSURE ENLIVE / ENSURE PLUS) liquid 237 mL  237 mL Oral BID BM Antonieta Pert, MD   237 mL at 03/03/21 1006   fluticasone (FLONASE) 50 MCG/ACT nasal spray 1 spray  1 spray Each Nare Daily Bobbitt, Shalon E, NP   1 spray at 03/03/21 0824   folic acid (FOLVITE) tablet 1 mg  1 mg Oral Daily Princess Bruins, DO   1 mg at 03/03/21 0825   gabapentin (NEURONTIN) capsule 100 mg  100 mg Oral TID Comer Locket, MD   100 mg at 03/03/21 1205   haloperidol (HALDOL) tablet 5 mg  5 mg Oral BID Bartholomew Crews E, MD   5 mg at 03/03/21 0831   hydrOXYzine (ATARAX/VISTARIL) tablet 50 mg  50 mg Oral TID PRN Comer Locket, MD   50 mg at 03/01/21 1704   isosorbide mononitrate (IMDUR) 24 hr tablet 75 mg  75 mg Oral Daily Antonieta Pert, MD   75 mg at 03/03/21 0824   lidocaine (LIDODERM) 5 % 1 patch  1 patch Transdermal Daily PRN Comer Locket, MD       LORazepam (ATIVAN) tablet 1 mg  1 mg Oral Q6H PRN Antonieta Pert, MD   1 mg at 02/25/21 0136   magnesium hydroxide (MILK OF MAGNESIA) suspension 30 mL  30 mL Oral Daily PRN Princess Bruins, DO   30 mL at 03/03/21 0829   meloxicam (MOBIC) tablet 15 mg  15 mg Oral Daily Antonieta Pert, MD   15 mg at 03/03/21 5277   methocarbamol (ROBAXIN) tablet 750 mg  750 mg Oral Q8H PRN Antonieta Pert, MD   750 mg at 03/02/21 2101   metoprolol tartrate (LOPRESSOR) tablet  25 mg  25 mg Oral BID Antonieta Pert, MD   25 mg at 03/03/21 0825   pantoprazole (PROTONIX) EC tablet 40 mg  40 mg Oral q morning Antonieta Pert, MD   40 mg at 03/03/21 1006   psyllium (HYDROCIL/METAMUCIL) 1 packet  1 packet Oral Daily Princess Bruins, DO   1 packet at 02/27/21 1241   thiamine tablet 100 mg  100 mg Oral Daily Princess Bruins, DO   100 mg at 03/03/21 0825   traZODone (DESYREL) tablet 300 mg  300 mg Oral QHS PRN Princess Bruins, DO   300 mg at 03/02/21 2100   Vitamin D (Ergocalciferol) (DRISDOL) capsule 50,000 Units  50,000 Units Oral Q7 days Antonieta Pert, MD   50,000 Units at 03/01/21 1116    Lab Results:  Results for orders placed or performed during the hospital encounter of 02/21/21 (from the past 48 hour(s))  HIV Antibody (routine testing w rflx)     Status: None   Collection Time: 03/02/21  6:36 PM  Result Value Ref Range   HIV Screen 4th Generation wRfx Non Reactive Non Reactive    Comment: Performed at [facility], [address], [place], [place] [address]  Comprehensive metabolic panel     Status: Abnormal   Collection Time: 03/03/21  6:29 AM  Result Value Ref Range   Sodium 141 135 - 145 mmol/L   Potassium 4.2 3.5 - 5.1 mmol/L   Chloride 105 98 - 111 mmol/L   CO2 30 22 - 32 mmol/L   Glucose, Bld 105 (H) 70 - 99 mg/dL  Comment: Glucose reference range applies only to samples taken after fasting for at least 8 hours.   BUN 15 6 - 20 mg/dL   Creatinine, Ser 0.980.95 0.44 - 1.00 mg/dL   Calcium 9.4 8.9 - 11.910.3 mg/dL   Total Protein 7.0 6.5 - 8.1 g/dL   Albumin 4.2 3.5 - 5.0 g/dL   AST 33 15 - 41 U/L   ALT 52 (H) 0 - 44 U/L   Alkaline Phosphatase 80 38 - 126 U/L   Total Bilirubin 0.5 0.3 - 1.2 mg/dL   GFR, Estimated >14>60 >78>60 mL/min    Comment: (NOTE) Calculated using the CKD-EPI Creatinine Equation (2021)    Anion gap 6 5 - 15    Comment: Performed at [facility], [address] [place], [place] [address]  CBC with  Differential/Platelet     Status: Abnormal   Collection Time: 03/03/21  6:29 AM  Result Value Ref Range   WBC 4.0 4.0 - 10.5 K/uL   RBC 3.94 3.87 - 5.11 MIL/uL   Hemoglobin 13.2 12.0 - 15.0 g/dL   HCT 13.040.6 86.536.0 - 78.446.0 %   MCV 103.0 (H) 80.0 - 100.0 fL   MCH 33.5 26.0 - 34.0 pg   MCHC 32.5 30.0 - 36.0 g/dL   RDW 69.612.9 29.511.5 - 28.415.5 %   Platelets 170 150 - 400 K/uL   nRBC 0.0 0.0 - 0.2 %   Neutrophils Relative % 36 %   Neutro Abs 1.4 (L) 1.7 - 7.7 K/uL   Lymphocytes Relative 46 %   Lymphs Abs 1.9 0.7 - 4.0 K/uL   Monocytes Relative 12 %   Monocytes Absolute 0.5 0.1 - 1.0 K/uL   Eosinophils Relative 5 %   Eosinophils Absolute 0.2 0.0 - 0.5 K/uL   Basophils Relative 1 %   Basophils Absolute 0.1 0.0 - 0.1 K/uL   Immature Granulocytes 0 %   Abs Immature Granulocytes 0.00 0.00 - 0.07 K/uL    Comment: Performed at [facility], [address] [place], [place] [address]    Blood Alcohol level:  Lab Results  Component Value Date   ETH 119 (H) 02/21/2021   ETH 47 (H) 01/25/2020    Metabolic Disorder Labs: Lab Results  Component Value Date   HGBA1C 4.8 02/21/2021   MPG 91.06 02/21/2021   MPG 76.71 08/26/2018   No results found for: PROLACTIN Lab Results  Component Value Date   CHOL 214 (H) 02/21/2021   TRIG 135 02/21/2021   HDL 58 02/21/2021   CHOLHDL 3.7 02/21/2021   VLDL 27 02/21/2021   LDLCALC 129 (H) 02/21/2021   LDLCALC 121 (H) 09/30/2017    Physical Findings: CIWA:  CIWA-Ar Total: 1 COWS:     Musculoskeletal: Strength & Muscle Tone: within normal limits Gait & Station: normal Patient leans: N/A  Psychiatric Specialty Exam: Physical Exam Constitutional:      Appearance: Normal appearance. She is obese.  HENT:     Head: Normocephalic.     Nose: Nose normal.  Eyes:     Conjunctiva/sclera: Conjunctivae normal.  Pulmonary:     Effort: Pulmonary effort is normal.  Musculoskeletal:     Cervical back: Normal range of motion.   Neurological:     Mental Status: She is alert and oriented to person, place, and time.    Review of Systems  Constitutional:  Positive for activity change (Improved, get out of bed and goes to group activites). Negative for appetite change and fatigue.  HENT:  Negative for congestion, rhinorrhea and sore throat.   Eyes:  Negative for visual disturbance.  Respiratory:  Negative for cough and shortness of breath.   Cardiovascular:  Negative for chest pain.  Gastrointestinal:  Negative for abdominal pain, constipation (Endorsed chronic constipation and typically have 2 bowel movement a week), nausea and vomiting.  Endocrine: Negative for cold intolerance and heat intolerance.  Genitourinary:  Negative for difficulty urinating (Reported that this is recurrent and has self-resolved in the past), dysuria, flank pain, urgency and vaginal pain.  Musculoskeletal:  Positive for back pain.  Neurological:  Negative for dizziness, tremors, seizures, light-headedness and headaches.  Psychiatric/Behavioral:  Negative for agitation, confusion, decreased concentration, hallucinations, sleep disturbance and suicidal ideas. The patient is not nervous/anxious and is not hyperactive.    Blood pressure 123/87, pulse 85, temperature 98 F (36.7 C), temperature source Oral, resp. rate 18, height  (1.702 m), weight 87.1 kg, SpO2 100 %.Body mass index is 30.07 kg/m.  General Appearance: Casual and Fairly Groomed  Eye Contact:  Fair  Speech:  Clear and Coherent and Normal Rate  Volume:  Normal  Mood:  described as improving - appears calm  Affect:brighter appearing affect, less constricted  Thought Process:  Coherent and Linear  Orientation:  Full (Time, Place, and Person)  Thought Content:  Patient denied SI/HI/VH, reported to hearing music in her head. Denied ideas of reference, paranoia, or first rank symptoms - no evidence of acute psychosis on exam  Suicidal Thoughts:  No  Homicidal Thoughts:  No   Memory:  Immediate;   Good Recent;   Good Remote;   Good  Judgement:  Fair  Insight:  Fair  Psychomotor Activity:  Normal - AIMS 0 and no tremor noted on exam, no cogwheeling or stiffness  Concentration:  Concentration: Good and Attention Span: Good  Recall:  Good  Fund of Knowledge:  Good  Language:  Good  Akathisia:  No  Handed:  Right  AIMS (if indicated):   0  Assets:  Communication Skills Desire for Improvement Housing Resilience  ADL's:  Intact  Cognition:  within normal limits  Sleep:  Number of Hours: 4    Treatment Plan Summary: Daily contact with patient to assess and evaluate symptoms and progress in treatment  [name], [phone] F who was brought to [facility] (02/21/2021) by [facility] police for SI with a plan. Past psychiatric history of bipolar disorder, cocaine use disorder vs cocaine dependence, alcohol use disorder vs alcohol dependence.  Today patient seemed anxious, stated that she could not sleep due to anxiety about discharge, 4 hours of sleep. Patient denied SI/HI/VH, but described hearing intermittent music in her head that does not bother her. Slept poorly last night at 4.75 hours. Received Trazodone 300 mg prn. Patient's ALT has leveled out (52), but ANC was 1.4 this morning. STAT CBC w/ diff is ordered, results showed ANC has normalized at 1.8 Urine culture is pending.  Patient's labs are stable and improving, she is ready for discharge to [facility] tomorrow, Tuesday (03/04/2021)  BIPOLAR - Cont Cymbalta  daily - D/C Carbamazepine - last dose (7/10)  Improved ALT (52) Improved ANC (1.8)  - Cont haldol  BID for AVH and mood stabilization (r/b/se/a to typical antipsychotic reviewed with patient and she requests to restart this as a home medication) - Cont trazodone  qHS PRN insomnia  ALCOHOL USE D/O AND STIMULANT USE D/O - COCAINE TYPE - Continue Neurontin  tid  to help with pain  and potential cravings, and mood  - Cont CIWA protocol monitoring with Ativan PO 1mg   PRN for CIWA score >10 with MVI and thiamine oral replacement  - Patient accepted to [facility] for next week  BACK PAIN - Cont Robaxin 750mg  PO 3 times daily as needed for pain - Changed acetaminophen tablet 650mg  q6HR to 500mg  q8 hours PRN due to worsening ALT - Cont Mobic 15mg  PO daily for osteoarthritic pain - Cont diclofenac sodium 1% topical gel 4 times daily for pain to PRN - Cont lidoderm 5% patches daily for pain to PRN - Continue Cymbalta 60mg  daily for mood and pain  - Continue neurontin 100mg  TID  ANXIETY - Cont hydroxyzine tablet 50mg  PO 3 times daily PRN for anxiety   NUTRITION - Cont Vitamin D Drisdol 50,000 units PO  - Cont feeding supplement (ENSURE) PO 2 times daily between meals - Cont Folic acid and thiamine   CHEST PAIN - Cont Imdur 75mg  24hour tablet PO daily for chest pain   HYPERTENSION - Cont amlodipine 5mg  PO daily for hypertension and chest pain - Cont clonidine 0.1mg  PO 3 times daily as needed for systolic blood pressure greater than 160 - Cont Lopressor 25mg  PO twice daily for health rate and hypertension   HYPERLIPIDEMIA - Cont Lipitor 40mg  PO daily for hyperlipidemia   GERD - Cont alum & mag hydroxide-simeth 82ml PO every 4hrs PRN - Cont protonix 40mg  daily  PERCEIVED URINARY RETENTION- resolved - Bladder scan negative for retention and symptoms improved after bowel movement - UA shows trace leukocytes, rare bacteria, 30 protein, 5 ketones - urine culture pending   CONSTIPATION - Cont magnesium hydroxide 41ml PO daily for mild constipation - Continue Colace 100mg  daily and Metamucil daily - Dulcolax 5 mg added 7/10   ASTHMA - Cont albuterol as needed for wheezing and shortness of breath   NASAL CONGESTION - Cont fluticasone 70mcg/act nasal spray daily   AGITATION - Cont Risperdal agitation protocol as needed - Cont Geodon agitation  protocol as needed   , PGY-1

## 2021-03-03 NOTE — [facility] Suicide Risk Assessment (Signed)
[facility] Discharge Suicide Risk Assessment   Principal Problem: Bipolar I disorder, most recent episode depressed, severe w psychosis (HCC) Discharge Diagnoses: Principal Problem:   Bipolar I disorder, most recent episode depressed, severe w psychosis (HCC) Active Problems:   Cocaine abuse (HCC)   Alcohol abuse  Total Time Spent in Direct Patient Care:  I personally spent 30 minutes on the unit in direct patient care. The direct patient care time included face-to-face time with the patient, reviewing the patient's chart, communicating with other professionals, and coordinating care. Greater than 50% of this time was spent in counseling or coordinating care with the patient regarding goals of hospitalization, psycho-education, and discharge planning needs.  Musculoskeletal: Strength & Muscle Tone: within normal limits Gait & Station: normal, steady Patient leans: N/A Psychiatric Specialty Exam: Physical Exam Vitals reviewed.  HENT:     Head: Normocephalic.  Pulmonary:     Effort: Pulmonary effort is normal.  Neurological:     General: No focal deficit present.     Mental Status: She is alert.    Review of Systems  Respiratory:  Negative for shortness of breath.   Cardiovascular:  Negative for chest pain.  Gastrointestinal:  Negative for constipation, diarrhea, nausea and vomiting.  Genitourinary:  Negative for difficulty urinating.   Blood pressure 123/87, pulse 85, temperature 98 F (36.7 C), temperature source Oral, resp. rate 18, height 5\' 7"  (1.702 m), weight 87.1 kg, SpO2 100 %.Body mass index is 30.07 kg/m.  General Appearance: Well Groomed and casually dressed  Eye Contact:  Good  Speech:  Clear and Coherent and Normal Rate  Volume:  Normal  Mood:  Anxious  Affect:  Congruent  Thought Process:  Goal Directed and Linear  Orientation:  Full (Time, Place, and Person)  Thought Content:   Reports intermittent residual AH of hearing music which is chronic; denies VH, ideas of  reference, paranoia, or first rank symptoms - does not appear to be responding to internal/external stimuli on exam  Suicidal Thoughts:  No  Homicidal Thoughts:  No  Memory:  Recent;   Fair  Judgement:  Fair  Insight:  Fair  Psychomotor Activity:  Normal; no cogwheeling, no stiffness, AIMS 0 no tremor  Concentration:  Concentration: Good and Attention Span: Good  Recall:  Fair  Fund of Knowledge:  Good  Language:  Good  Akathisia:  Negative  Assets:  Communication Skills Desire for Improvement Resilience  ADL's:  Intact  Cognition:  WNL  Sleep:  Number of Hours: 4   Mental Status Per Nursing Assessment::   On Admission:  Suicidal ideation indicated by patient  Demographic Factors:  Living alone,unemployed  Loss Factors: Chronic health/pain issues  Historical Factors: Previous psychiatric diagnosis/treatment  Risk Reduction Factors:   Positive coping skills or problem solving skills; supportive sister  Continued Clinical Symptoms:  Bipolar Disorder:   Depressive phase Alcohol/Substance Abuse/Dependencies Previous Psychiatric Diagnoses and Treatments  Cognitive Features That Contribute To Risk:  None    Suicide Risk:  Minimal: No identifiable suicidal ideation.  May be classified as minimal risk based on the severity of the depressive symptoms   Follow-up Information     [phone] Follow up.   Why: You have been accepted to this facility for substance use treatment on 03/04/21. Contact information: [address] [place] [phone]                 Plan Of Care/Follow-up recommendations:  Activity:  as tolerated Diet:  heart healthy  Other:  Patient advised to go directly to [facility] and to abstain from alcohol or illicit drug use after discharge. Patient advised she will need ongoing AIMs, weight, CBC, EKG, lipid and glucose monitoring while on Haldol. She was advised that she will need a primary care  provider to recheck her white blood cell count and liver panel in the next week without fail for monitoring. She was advised to avoid excessive amounts of Tylenol given elevated liver enzymes. She was encouraged to have low fat/low cholesterol diet and have a primary care provider manage her mildly elevated cholesterol as an outpatient. She has a urine culture pending.  Comer Locket, MD, FAPA 03/03/2021, 4:41 PM

## 2021-03-03 NOTE — [facility] Group Notes (Signed)
Occupational Therapy Group Note Date: 03/03/2021 Group Topic/Focus: Feelings Management  Group Description: Group encouraged increased engagement and participation through discussion focused on Building Happiness. Patients were provided a handout and reviewed therapeutic strategies to build happiness including identifying gratitudes, random acts of kindness, exercise, meditation, positive journaling, and fostering relationships. Patients engaged in discussion and encouraged to reflect on each strategy and their experiences.  Therapeutic Goal(s): Identify strategies to build happiness. Identify and implement therapeutic strategies to improve overall mood. Practice and identify gratitudes, random acts of kindness, exercise, meditation, positive journaling, and fostering relationships Participation Level: Active   Participation Quality: Partial Attendance - 30 minutes   Behavior: Calm and Cooperative   Speech/Thought Process: Directed   Affect/Mood: Euthymic   Insight: Fair   Judgement: Fair   Individualization: Lilit was active in their participation of group discussion/activity. Pt appeared receptive to use of strategies reviewed to improve and build positive emotions.   Modes of Intervention: Activity, Discussion, Education, and Socialization  Patient Response to Interventions:  Attentive   Plan: Continue to engage patient in OT groups 2 - 3x/week.  03/03/2021  Donne Hazel, MOT, OTR/L

## 2021-03-03 NOTE — Progress Notes (Signed)
[facility] Group Notes:  (Nursing/MHT/Case Management/Adjunct)  Date:  03/03/2021  Time:  2015  Type of Therapy:   wrap up group  Participation Level:  Active  Participation Quality:  Appropriate, Attentive, Sharing, and Supportive  Affect:  Flat  Cognitive:  Alert  Insight:  Improving  Engagement in Group:  Engaged  Modes of Intervention:  Clarification, Education, and Support  Summary of Progress/Problems: Positive thinking and positive change were discussed.   Amanda [name] 03/03/2021, 9:08 PM

## 2021-03-03 NOTE — Progress Notes (Signed)
  [facility] Adult Case Management Discharge Plan :  Will you be returning to the same living situation after discharge:  No. [facility] At discharge, do you have transportation home?: Yes,  Safe Transport to [employer] Do you have the ability to pay for your medications: Yes,  Medicare  Release of information consent forms completed and in the chart;  Patient's signature needed at discharge.  Patient to Follow up at:  Follow-up Information     [facility] Follow up.   Why: You have been accepted to this facility for substance use treatment on 03/04/21. Contact information: [address] [place] [address] [phone]                 Next level of care provider has access to [facility] Link:no  Safety Planning and Suicide Prevention discussed: Yes,  w/ pt     Has patient been referred to the Quitline?: Patient refused referral  Patient has been referred for addiction treatment: Yes  Felizardo Hoffmann, LCSWA 03/03/2021, 3:43 PM

## 2021-03-03 NOTE — Progress Notes (Signed)
Recreation Therapy Notes  Date: 7.11.22 Time: 0930 Location: [address] Dayroom   Group Topic: Stress Management   Goal Area(s) Addresses: Patient will actively participate in stress management techniques presented during session. Patient will successfully identify benefit of practicing stress management post d/c.   Intervention: Guided exercise with ambient sound and script   Activity :Guided Imagery   LRT provided education, instruction, and demonstration on practice of visualization via guided imagery. Patient was asked to participate in the technique introduced during session. LRT also debriefed including topics of mindfulness, stress management and specific scenarios each patient could use these techniques. Patients were given suggestions of ways to access scripts post d/c and encouraged to explore Youtube and other apps available on smartphones, tablets, and computers.   Education:  Stress Management, Discharge Planning.   Education Outcome: Acknowledges education   Clinical Observations/Feedback: Patient did not attend group session.         Caroll Rancher, LRT/CTRS         Lillia Abed, Lashaye Fisk A 03/03/2021 11:12 AM

## 2021-03-03 NOTE — BHH Counselor (Signed)
CSW spoke with pt's sister, Cala Bradford, and discussed pt's affect and aftercare plan.   Fredirick Lathe, LCSWA Clinicial Social Worker [employer]

## 2021-03-03 NOTE — BHH Group Notes (Signed)
Type of Therapy and Topic:  Group Therapy - Healthy vs Unhealthy Coping Skills   Participation Level: Active   Description of Group The focus of this group was to determine what unhealthy coping techniques typically are used by group members and what healthy coping techniques would be helpful in coping with various problems. Patients were guided in becoming aware of the differences between healthy and unhealthy coping techniques. Patients were asked to identify 2-3 healthy coping skills they would like to learn to use more effectively.   Therapeutic Goals 1. Patients learned that coping is what human beings do all day long to deal with various situations in their lives 2. Patients defined and discussed healthy vs unhealthy coping techniques 3. Patients identified their preferred coping techniques and identified whether these were healthy or unhealthy 4. Patients determined 2-3 healthy coping skills they would like to become more familiar with and use more often. 5. Patients provided support and ideas to each other     Summary of Patient Progress: Due to the acuity and complex discharge plans, group was not held. Patient was provided therapeutic worksheets and asked to meet with CSW as needed.  Jakyrie Totherow, LCSWA Clinicial Social Worker [facility]  

## 2021-03-04 LAB — URINE CULTURE

## 2021-03-04 NOTE — BHH Group Notes (Signed)
ADULT GRIEF GROUP NOTE:   Spiritual care group on grief and loss facilitated by chaplain Dyanne Carrel, [facility]   Group Goal:   Support / Education around grief and loss   Members engage in facilitated group support and psycho-social education.   Group Description:   Following introductions and group rules, group members engaged in facilitated group dialog and support around topic of loss, with particular support around experiences of loss in their lives. Group Identified types of loss (relationships / self / things) and identified patterns, circumstances, and changes that precipitate losses. Reflected on thoughts / feelings around loss, normalized grief responses, and recognized variety in grief experience. Group noted Worden's four tasks of grief in discussion.   Group drew on Adlerian / Rogerian, narrative, MI,   Patient Progress:  Amanda [name] participated in group and remained engaged in the conversation.  She shared about the loss of her grandmother and was able to talk about some coping strategies.  Chaplain Dyanne Carrel, [facility] Pager, [phone] 11:47 AM

## 2021-03-04 NOTE — Progress Notes (Signed)
   03/04/21 0011  Psych Admission Type (Psych Patients Only)  Admission Status Voluntary  Psychosocial Assessment  Patient Complaints Anxiety  Eye Contact Fair  Facial Expression Animated;Anxious  Affect Anxious  Speech Logical/coherent  Interaction Assertive  Motor Activity Slow  Appearance/Hygiene Unremarkable  Behavior Characteristics Cooperative  Mood Pleasant  Thought Process  Coherency WDL  Content WDL  Delusions None reported or observed  Perception WDL  Hallucination None reported or observed  Judgment Poor  Confusion None  Danger to Self  Current suicidal ideation? Denies  Self-Injurious Behavior No self-injurious ideation or behavior indicators observed or expressed   Agreement Not to Harm Self No  Description of Agreement verbal agreement to contract for safety  Danger to Others  Danger to Others None reported or observed

## 2021-03-04 NOTE — Progress Notes (Signed)
Pt discharged to [facility] treatment center via taxi to take pt greyhound in [place].  Pt was stable and appreciative at that time. All papers and prescriptions were given and valuables returned. Verbal understanding expressed. Denies SI/HI and A/VH. Pt given opportunity to express concerns and ask questions.

## 2021-03-06 ENCOUNTER — Ambulatory Visit: Payer: Medicare Other | Admitting: Sports Medicine

## 2021-03-06 ENCOUNTER — Other Ambulatory Visit: Payer: Medicare Other

## 2021-04-02 ENCOUNTER — Ambulatory Visit: Payer: Medicare Other

## 2021-04-10 ENCOUNTER — Ambulatory Visit (INDEPENDENT_AMBULATORY_CARE_PROVIDER_SITE_OTHER): Payer: Medicare Other | Admitting: Sports Medicine

## 2021-04-10 ENCOUNTER — Encounter: Payer: Self-pay | Admitting: Sports Medicine

## 2021-04-10 ENCOUNTER — Other Ambulatory Visit: Payer: Self-pay

## 2021-04-10 DIAGNOSIS — B070 Plantar wart: Secondary | ICD-10-CM | POA: Diagnosis not present

## 2021-04-10 DIAGNOSIS — M79671 Pain in right foot: Secondary | ICD-10-CM | POA: Diagnosis not present

## 2021-04-10 DIAGNOSIS — L603 Nail dystrophy: Secondary | ICD-10-CM | POA: Diagnosis not present

## 2021-04-10 DIAGNOSIS — G629 Polyneuropathy, unspecified: Secondary | ICD-10-CM

## 2021-04-10 DIAGNOSIS — M79672 Pain in left foot: Secondary | ICD-10-CM

## 2021-04-10 DIAGNOSIS — Q828 Other specified congenital malformations of skin: Secondary | ICD-10-CM

## 2021-04-10 NOTE — Progress Notes (Signed)
Subjective: Amanda [name] is a 55 y.o. female patient who returns to office for follow up to hard skin and for foot pain. Reports that callus/wart areas are doing better but missed her last appointment and has a new one on the heel that is hurting. States that she wants her nails trimmed today as well. Patient denies any other pedal complaints at this time.  Patient Active Problem List   Diagnosis Date Noted   Bipolar I disorder, most recent episode depressed, severe w psychosis (HCC) 02/25/2021   Alcohol abuse 02/25/2021   Abnormal weight loss 12/19/2020   Change in bowel habit 12/19/2020   Colon cancer screening 12/19/2020   Diarrhea 12/19/2020   Dysphagia 12/19/2020   Epigastric pain 12/19/2020   Family history of malignant neoplasm of gastrointestinal tract 12/19/2020   Non-ST elevation (NSTEMI) myocardial infarction [facility])    Contusion of chest 08/26/2018   Cardiac arrest (HCC) 08/26/2018   Cocaine abuse (HCC) 08/26/2018   HLD (hyperlipidemia) 08/26/2018   Elevated LFTs 08/26/2018   History of migraine    Degenerative joint disease 07/26/2017   Morbid obesity (HCC) 12/24/2016   Hyperlipidemia 12/23/2016   Painful orthopaedic hardware (HCC) 03/24/2016   Corns and callosities 03/24/2016   PUD (peptic ulcer disease) 01/02/2016   Onychomycosis 01/02/2016   Seasonal allergies 11/19/2015   GERD (gastroesophageal reflux disease) 08/06/2015   Asthma 08/06/2015   Migraine 08/06/2015   Bipolar disorder (HCC) 07/25/2015   Neuropathy 07/08/2015   Major depressive disorder, recurrent, severe without psychotic features (HCC)    Alcohol use disorder, severe, dependence (HCC) 11/16/2014   Cocaine use disorder, severe, dependence (HCC) 11/16/2014   Acute calculous cholecystitis s/p lap chole 02/09/2014 02/09/2014   HTN (hypertension) 07/25/2013   Carpal tunnel syndrome 11/18/2012   Lumbar radiculopathy 11/18/2012   Anxiety state, unspecified 11/18/2012    Current Outpatient  Medications on File Prior to Visit  Medication Sig Dispense Refill   albuterol (VENTOLIN HFA) 108 (90 Base) MCG/ACT inhaler Inhale 2 puffs into the lungs every 4 (four) hours as needed for wheezing or shortness of breath.     ALPRAZolam (XANAX) 0.5 MG tablet Take 0.5 mg by mouth daily as needed.     amLODipine (NORVASC) 5 MG tablet Take 1 tablet (5 mg total) by mouth daily. 30 tablet 6   atorvastatin (LIPITOR) 10 MG tablet Take 10 mg by mouth at bedtime.     atorvastatin (LIPITOR) 40 MG tablet Take 1 tablet (40 mg total) by mouth daily. 30 tablet 6   buPROPion (WELLBUTRIN SR) 150 MG 12 hr tablet Take 150 mg by mouth daily.     cyclobenzaprine (FLEXERIL) 10 MG tablet Take 10 mg by mouth 3 (three) times daily.     fluticasone (FLONASE) 50 MCG/ACT nasal spray Place into both nostrils.     haloperidol (HALDOL) 10 MG tablet Take 10 mg by mouth 2 (two) times daily.     HYDROcodone-acetaminophen (NORCO) 10-325 MG tablet Take 1 tablet by mouth every 4 (four) hours as needed.     hydrOXYzine (ATARAX/VISTARIL) 50 MG tablet Take 50 mg by mouth 3 (three) times daily as needed.     meloxicam (MOBIC) 7.5 MG tablet Take 7.5 mg by mouth daily.     pantoprazole (PROTONIX) 40 MG tablet Take 40 mg by mouth every morning.     trazodone (DESYREL) 300 MG tablet Take 300 mg by mouth at bedtime.     venlafaxine XR (EFFEXOR-XR) 37.5 MG 24 hr capsule Take 37.5 mg by  mouth daily.     benztropine (COGENTIN) 0.5 MG tablet Take 1 tablet (0.5 mg total) by mouth 2 (two) times daily as needed for tremors (EPS). 60 tablet 0   DULoxetine (CYMBALTA) 60 MG capsule Take 1 capsule (60 mg total) by mouth daily. 30 capsule 0   gabapentin (NEURONTIN) 100 MG capsule Take 1 capsule (100 mg total) by mouth 3 (three) times daily. 90 capsule 0   haloperidol (HALDOL) 5 MG tablet Take 1 tablet (5 mg total) by mouth 2 (two) times daily. 60 tablet 0   isosorbide mononitrate (IMDUR) 30 MG 24 hr tablet Take 2.5 tablets (75 mg total) by mouth  daily. 75 tablet 0   metoprolol tartrate (LOPRESSOR) 25 MG tablet Take 1 tablet (25 mg total) by mouth 2 (two) times daily. 60 tablet 0   [DISCONTINUED] cetirizine (ZYRTEC) 10 MG tablet Take 1 tablet (10 mg total) by mouth daily. 30 tablet 5   [DISCONTINUED] cloNIDine (CATAPRES) 0.1 MG tablet Take 1 tablet (0.1 mg total) by mouth at bedtime as needed. For hot flashes 30 tablet 3   [DISCONTINUED] ipratropium (ATROVENT) 0.03 % nasal spray Place 2 sprays into both nostrils 2 (two) times daily.     [DISCONTINUED] SUMAtriptan (IMITREX) 50 MG tablet TAKE 1 TABLET BY MOUTH AT THE ONSET OF A MIGRAINE. MAY REPEAT IN 2 HOURS IF HEADACHE PERSISTS OR RECURS. MAXIMUM DAILY DOSE 200 MG 9 tablet 2   [DISCONTINUED] SYMBICORT 80-4.5 MCG/ACT inhaler SMARTSIG:2 Puff(s) By Mouth Twice Daily     [DISCONTINUED] topiramate (TOPAMAX) 25 MG tablet      No current facility-administered medications on file prior to visit.    Allergies  Allergen Reactions   Penicillins Anaphylaxis    Has patient had a PCN reaction causing immediate rash, facial/tongue/throat swelling, SOB or lightheadedness with hypotension: Yes Has patient had a PCN reaction causing severe rash involving mucus membranes or skin necrosis: Yes Has patient had a PCN reaction that required hospitalization Yes Has patient had a PCN reaction occurring within the last 10 years: No-more than 10 years ago If all of the above answers are "NO", then may proceed with Cephalosporin use.    Penicillins Anaphylaxis    DID THE REACTION INVOLVE: Swelling of the face/tongue/throat, SOB, or low BP? Yes Sudden or severe rash/hives, skin peeling, or the inside of the mouth or nose? Yes Did it require medical treatment? No When did it last happen? Within the past 10 years If all above answers are "NO", may proceed with cephalosporin use.     Asa [Aspirin] Rash   Aspirin Rash    Objective:  General: Alert and oriented x3 in no acute distress  Dermatology:  Keratotic lesion present plantar foot right greater than left at the cuboid and lateral heel on the right and submet 5 on the left with skin lines transversing the lesion, pain is present with direct pressure to the lesion with a central nucleated core noted, that appears to be improving, no webspace macerations, no ecchymosis bilateral, all nails x 8 are elongated and thickened, there is minimal nails noted to bilateral hallux from previous history of nail removal.  Vascular: Dorsalis Pedis and Posterior Tibial pedal pulses 1/4, Capillary Fill Time 5 seconds, decreased pedal hair growth bilateral, no edema bilateral lower extremities, Temperature gradient within normal limits.  Neurology: Michaell Cowing sensation intact via light touch bilateral.  Subjective burning bilateral with history of neuropathy previous nerve studies + today worse at right great toe.  Musculoskeletal: Mild tenderness with  palpation at the keratotic lesion site on Right>Left, Muscular strength 5/5 in all groups without pain or limitation on range of motion.  Pes planus foot deformity noted bilateral.  Assessment and Plan: Problem List Items Addressed This Visit       Nervous and Auditory   Neuropathy   Other Visit Diagnoses     Plantar wart    -  Primary   Porokeratosis       Onychodystrophy       Pain in right foot       Left foot pain           -Complete examination performed -Re-Discussed treatment options -Mechically debrided nails x 8 using sterile nail nipper without incident  -Parred keratoic lesion using a chisel x3 blade; treated the area with Salinocaine to area -Continue with good supportive shoes daily for foot type -Patient to return to office 4-6 weeks for follow-up on keratotic lesions or sooner if condition worsens.  Asencion Islam, DPM

## 2021-05-15 ENCOUNTER — Ambulatory Visit: Payer: Medicare Other | Admitting: Sports Medicine

## 2021-05-20 ENCOUNTER — Ambulatory Visit: Payer: Medicare Other

## 2021-05-22 ENCOUNTER — Encounter: Payer: Self-pay | Admitting: Sports Medicine

## 2021-05-22 ENCOUNTER — Ambulatory Visit (INDEPENDENT_AMBULATORY_CARE_PROVIDER_SITE_OTHER): Payer: Medicare Other | Admitting: Sports Medicine

## 2021-05-22 ENCOUNTER — Other Ambulatory Visit: Payer: Self-pay

## 2021-05-22 DIAGNOSIS — M79671 Pain in right foot: Secondary | ICD-10-CM

## 2021-05-22 DIAGNOSIS — M79672 Pain in left foot: Secondary | ICD-10-CM

## 2021-05-22 DIAGNOSIS — G629 Polyneuropathy, unspecified: Secondary | ICD-10-CM

## 2021-05-22 DIAGNOSIS — B070 Plantar wart: Secondary | ICD-10-CM

## 2021-05-22 DIAGNOSIS — Q828 Other specified congenital malformations of skin: Secondary | ICD-10-CM

## 2021-05-22 DIAGNOSIS — L603 Nail dystrophy: Secondary | ICD-10-CM

## 2021-05-22 MED ORDER — DICLOFENAC SODIUM 75 MG PO TBEC: 75.0000 mg | DELAYED_RELEASE_TABLET | Freq: Two times a day (BID) | ORAL | 0 refills | Status: DC

## 2021-05-22 NOTE — Progress Notes (Signed)
Subjective: Amanda [name] is a 55 y.o. female patient who returns to office for follow up to callus skin and for nail care.  Patient reports that she has been having a lot of pain in her feet and legs and back and shoulder and that her current pain medicine given by her pain doctor of Percocet is not helping.  No other pedal complaints noted.  Patient Active Problem List   Diagnosis Date Noted   Bipolar I disorder, most recent episode depressed, severe w psychosis (HCC) 02/25/2021   Alcohol abuse 02/25/2021   Abnormal weight loss 12/19/2020   Change in bowel habit 12/19/2020   Colon cancer screening 12/19/2020   Diarrhea 12/19/2020   Dysphagia 12/19/2020   Epigastric pain 12/19/2020   Family history of malignant neoplasm of gastrointestinal tract 12/19/2020   Non-ST elevation (NSTEMI) myocardial infarction [facility])    Contusion of chest 08/26/2018   Cardiac arrest (HCC) 08/26/2018   Cocaine abuse (HCC) 08/26/2018   HLD (hyperlipidemia) 08/26/2018   Elevated LFTs 08/26/2018   History of migraine    Degenerative joint disease 07/26/2017   Morbid obesity (HCC) 12/24/2016   Hyperlipidemia 12/23/2016   Painful orthopaedic hardware (HCC) 03/24/2016   Corns and callosities 03/24/2016   PUD (peptic ulcer disease) 01/02/2016   Onychomycosis 01/02/2016   Seasonal allergies 11/19/2015   GERD (gastroesophageal reflux disease) 08/06/2015   Asthma 08/06/2015   Migraine 08/06/2015   Bipolar disorder (HCC) 07/25/2015   Neuropathy 07/08/2015   Major depressive disorder, recurrent, severe without psychotic features (HCC)    Alcohol use disorder, severe, dependence (HCC) 11/16/2014   Cocaine use disorder, severe, dependence (HCC) 11/16/2014   Acute calculous cholecystitis s/p lap chole 02/09/2014 02/09/2014   HTN (hypertension) 07/25/2013   Carpal tunnel syndrome 11/18/2012   Lumbar radiculopathy 11/18/2012   Anxiety state, unspecified 11/18/2012    Current Outpatient Medications on File  Prior to Visit  Medication Sig Dispense Refill   albuterol (VENTOLIN HFA) 108 (90 Base) MCG/ACT inhaler Inhale 2 puffs into the lungs every 4 (four) hours as needed for wheezing or shortness of breath.     ALPRAZolam (XANAX) 0.5 MG tablet Take 0.5 mg by mouth daily as needed.     amLODipine (NORVASC) 5 MG tablet Take 1 tablet (5 mg total) by mouth daily. 30 tablet 6   atorvastatin (LIPITOR) 10 MG tablet Take 10 mg by mouth at bedtime.     atorvastatin (LIPITOR) 40 MG tablet Take 1 tablet (40 mg total) by mouth daily. 30 tablet 6   benztropine (COGENTIN) 0.5 MG tablet Take 1 tablet (0.5 mg total) by mouth 2 (two) times daily as needed for tremors (EPS). 60 tablet 0   buPROPion (WELLBUTRIN SR) 150 MG 12 hr tablet Take 150 mg by mouth daily.     cyclobenzaprine (FLEXERIL) 10 MG tablet Take 10 mg by mouth 3 (three) times daily.     DULoxetine (CYMBALTA) 60 MG capsule Take 1 capsule (60 mg total) by mouth daily. 30 capsule 0   fluticasone (FLONASE) 50 MCG/ACT nasal spray Place into both nostrils.     gabapentin (NEURONTIN) 100 MG capsule Take 1 capsule (100 mg total) by mouth 3 (three) times daily. 90 capsule 0   haloperidol (HALDOL) 10 MG tablet Take 10 mg by mouth 2 (two) times daily.     haloperidol (HALDOL) 5 MG tablet Take 1 tablet (5 mg total) by mouth 2 (two) times daily. 60 tablet 0   HYDROcodone-acetaminophen (NORCO) 10-325 MG tablet Take 1 tablet  by mouth every 4 (four) hours as needed.     hydrOXYzine (ATARAX/VISTARIL) 50 MG tablet Take 50 mg by mouth 3 (three) times daily as needed.     isosorbide mononitrate (IMDUR) 30 MG 24 hr tablet Take 2.5 tablets (75 mg total) by mouth daily. 75 tablet 0   meloxicam (MOBIC) 7.5 MG tablet Take 7.5 mg by mouth daily.     metoprolol tartrate (LOPRESSOR) 25 MG tablet Take 1 tablet (25 mg total) by mouth 2 (two) times daily. 60 tablet 0   pantoprazole (PROTONIX) 40 MG tablet Take 40 mg by mouth every morning.     trazodone (DESYREL) 300 MG tablet Take  300 mg by mouth at bedtime.     venlafaxine XR (EFFEXOR-XR) 37.5 MG 24 hr capsule Take 37.5 mg by mouth daily.     [DISCONTINUED] cetirizine (ZYRTEC) 10 MG tablet Take 1 tablet (10 mg total) by mouth daily. 30 tablet 5   [DISCONTINUED] cloNIDine (CATAPRES) 0.1 MG tablet Take 1 tablet (0.1 mg total) by mouth at bedtime as needed. For hot flashes 30 tablet 3   [DISCONTINUED] ipratropium (ATROVENT) 0.03 % nasal spray Place 2 sprays into both nostrils 2 (two) times daily.     [DISCONTINUED] SUMAtriptan (IMITREX) 50 MG tablet TAKE 1 TABLET BY MOUTH AT THE ONSET OF A MIGRAINE. MAY REPEAT IN 2 HOURS IF HEADACHE PERSISTS OR RECURS. MAXIMUM DAILY DOSE 200 MG 9 tablet 2   [DISCONTINUED] SYMBICORT 80-4.5 MCG/ACT inhaler SMARTSIG:2 Puff(s) By Mouth Twice Daily     [DISCONTINUED] topiramate (TOPAMAX) 25 MG tablet      No current facility-administered medications on file prior to visit.    Allergies  Allergen Reactions   Penicillins Anaphylaxis    Has patient had a PCN reaction causing immediate rash, facial/tongue/throat swelling, SOB or lightheadedness with hypotension: Yes Has patient had a PCN reaction causing severe rash involving mucus membranes or skin necrosis: Yes Has patient had a PCN reaction that required hospitalization Yes Has patient had a PCN reaction occurring within the last 10 years: No-more than 10 years ago If all of the above answers are "NO", then may proceed with Cephalosporin use.    Penicillins Anaphylaxis    DID THE REACTION INVOLVE: Swelling of the face/tongue/throat, SOB, or low BP? Yes Sudden or severe rash/hives, skin peeling, or the inside of the mouth or nose? Yes Did it require medical treatment? No When did it last happen? Within the past 10 years If all above answers are "NO", may proceed with cephalosporin use.     Asa [Aspirin] Rash   Aspirin Rash    Objective:  General: Alert and oriented x3 in no acute distress  Dermatology: Keratotic lesion present  plantar foot right greater than left at the cuboid and lateral heel on the right and submet 5 on the left with skin lines transversing the lesion, pain is present with direct pressure to the lesion with a central nucleated core noted, that appears to be improving, no webspace macerations, no ecchymosis bilateral, all nails x 8 are elongated and thickened, there is minimal nails noted to bilateral hallux from previous history of nail removal.  Vascular: Dorsalis Pedis and Posterior Tibial pedal pulses 1/4, Capillary Fill Time 5 seconds, decreased pedal hair growth bilateral, no edema bilateral lower extremities, Temperature gradient within normal limits.  Neurology: Michaell Cowing sensation intact via light touch bilateral.    Musculoskeletal: Mild tenderness with palpation at the keratotic lesion site on Right>Left, Muscular strength 5/5 in all groups without  pain or limitation on range of motion.  Pes planus foot deformity noted bilateral.  Assessment and Plan: Problem List Items Addressed This Visit       Nervous and Auditory   Neuropathy   Other Visit Diagnoses     Plantar wart    -  Primary   Porokeratosis       Onychodystrophy       Pain in right foot       Left foot pain           -Complete examination performed -Re-Discussed treatment options -Rx diclofenac to take twice a day for pain 75 mg and advised patient that I could not give her anything else for pain because of her contract with her pain management doctor -Mechically debrided nails x 8 using sterile nail nipper without incident  -Parred keratoic lesion using a chisel x3 blade; treated the area with Salinocaine to area -Continue with good supportive shoes daily for foot type like previous -Patient to return to office 4-6 weeks for follow-up on keratotic lesions or sooner if condition worsens.  Asencion Islam, DPM

## 2021-06-13 ENCOUNTER — Other Ambulatory Visit: Payer: Self-pay | Admitting: Orthopedic Surgery

## 2021-06-13 DIAGNOSIS — M542 Cervicalgia: Secondary | ICD-10-CM

## 2021-06-19 ENCOUNTER — Encounter: Payer: Self-pay | Admitting: Sports Medicine

## 2021-06-19 ENCOUNTER — Other Ambulatory Visit: Payer: Self-pay

## 2021-06-19 ENCOUNTER — Ambulatory Visit (INDEPENDENT_AMBULATORY_CARE_PROVIDER_SITE_OTHER): Payer: Medicare Other | Admitting: Sports Medicine

## 2021-06-19 DIAGNOSIS — B070 Plantar wart: Secondary | ICD-10-CM | POA: Diagnosis not present

## 2021-06-19 DIAGNOSIS — G629 Polyneuropathy, unspecified: Secondary | ICD-10-CM

## 2021-06-19 DIAGNOSIS — Q828 Other specified congenital malformations of skin: Secondary | ICD-10-CM

## 2021-06-19 DIAGNOSIS — M79671 Pain in right foot: Secondary | ICD-10-CM

## 2021-06-19 DIAGNOSIS — M79672 Pain in left foot: Secondary | ICD-10-CM

## 2021-06-19 NOTE — Progress Notes (Signed)
Subjective: Amanda [name] is a 55 y.o. female patient who returns to office for follow up to callus skin/wart.  Patient reports that she is having spine issues and will have to see a neurosurgeon after she gets MRI.  No other pedal complaints noted.  Patient Active Problem List   Diagnosis Date Noted   Bipolar I disorder, most recent episode depressed, severe w psychosis (HCC) 02/25/2021   Alcohol abuse 02/25/2021   Abnormal weight loss 12/19/2020   Change in bowel habit 12/19/2020   Colon cancer screening 12/19/2020   Diarrhea 12/19/2020   Dysphagia 12/19/2020   Epigastric pain 12/19/2020   Family history of malignant neoplasm of gastrointestinal tract 12/19/2020   Non-ST elevation (NSTEMI) myocardial infarction [facility])    Contusion of chest 08/26/2018   Cardiac arrest (HCC) 08/26/2018   Cocaine abuse (HCC) 08/26/2018   HLD (hyperlipidemia) 08/26/2018   Elevated LFTs 08/26/2018   History of migraine    Degenerative joint disease 07/26/2017   Morbid obesity (HCC) 12/24/2016   Hyperlipidemia 12/23/2016   Painful orthopaedic hardware (HCC) 03/24/2016   Corns and callosities 03/24/2016   PUD (peptic ulcer disease) 01/02/2016   Onychomycosis 01/02/2016   Seasonal allergies 11/19/2015   GERD (gastroesophageal reflux disease) 08/06/2015   Asthma 08/06/2015   Migraine 08/06/2015   Bipolar disorder (HCC) 07/25/2015   Neuropathy 07/08/2015   Major depressive disorder, recurrent, severe without psychotic features (HCC)    Alcohol use disorder, severe, dependence (HCC) 11/16/2014   Cocaine use disorder, severe, dependence (HCC) 11/16/2014   Acute calculous cholecystitis s/p lap chole 02/09/2014 02/09/2014   HTN (hypertension) 07/25/2013   Carpal tunnel syndrome 11/18/2012   Lumbar radiculopathy 11/18/2012   Anxiety state, unspecified 11/18/2012    Current Outpatient Medications on File Prior to Visit  Medication Sig Dispense Refill   albuterol (VENTOLIN HFA) 108 (90 Base)  MCG/ACT inhaler Inhale 2 puffs into the lungs every 4 (four) hours as needed for wheezing or shortness of breath.     ALPRAZolam (XANAX) 0.5 MG tablet Take 0.5 mg by mouth daily as needed.     amLODipine (NORVASC) 5 MG tablet Take 1 tablet (5 mg total) by mouth daily. 30 tablet 6   atorvastatin (LIPITOR) 10 MG tablet Take 10 mg by mouth at bedtime.     atorvastatin (LIPITOR) 40 MG tablet Take 1 tablet (40 mg total) by mouth daily. 30 tablet 6   benztropine (COGENTIN) 0.5 MG tablet Take 1 tablet (0.5 mg total) by mouth 2 (two) times daily as needed for tremors (EPS). 60 tablet 0   buPROPion (WELLBUTRIN SR) 150 MG 12 hr tablet Take 150 mg by mouth daily.     cyclobenzaprine (FLEXERIL) 10 MG tablet Take 10 mg by mouth 3 (three) times daily.     diclofenac (VOLTAREN) 75 MG EC tablet Take 1 tablet (75 mg total) by mouth 2 (two) times daily. 60 tablet 0   DULoxetine (CYMBALTA) 60 MG capsule Take 1 capsule (60 mg total) by mouth daily. 30 capsule 0   fluticasone (FLONASE) 50 MCG/ACT nasal spray Place into both nostrils.     gabapentin (NEURONTIN) 100 MG capsule Take 1 capsule (100 mg total) by mouth 3 (three) times daily. 90 capsule 0   haloperidol (HALDOL) 10 MG tablet Take 10 mg by mouth 2 (two) times daily.     haloperidol (HALDOL) 5 MG tablet Take 1 tablet (5 mg total) by mouth 2 (two) times daily. 60 tablet 0   HYDROcodone-acetaminophen (NORCO) 10-325 MG tablet Take  1 tablet by mouth every 4 (four) hours as needed.     hydrOXYzine (ATARAX/VISTARIL) 50 MG tablet Take 50 mg by mouth 3 (three) times daily as needed.     isosorbide mononitrate (IMDUR) 30 MG 24 hr tablet Take 2.5 tablets (75 mg total) by mouth daily. 75 tablet 0   meloxicam (MOBIC) 7.5 MG tablet Take 7.5 mg by mouth daily.     metoprolol tartrate (LOPRESSOR) 25 MG tablet Take 1 tablet (25 mg total) by mouth 2 (two) times daily. 60 tablet 0   pantoprazole (PROTONIX) 40 MG tablet Take 40 mg by mouth every morning.     trazodone (DESYREL)  300 MG tablet Take 300 mg by mouth at bedtime.     venlafaxine XR (EFFEXOR-XR) 37.5 MG 24 hr capsule Take 37.5 mg by mouth daily.     [DISCONTINUED] cetirizine (ZYRTEC) 10 MG tablet Take 1 tablet (10 mg total) by mouth daily. 30 tablet 5   [DISCONTINUED] cloNIDine (CATAPRES) 0.1 MG tablet Take 1 tablet (0.1 mg total) by mouth at bedtime as needed. For hot flashes 30 tablet 3   [DISCONTINUED] ipratropium (ATROVENT) 0.03 % nasal spray Place 2 sprays into both nostrils 2 (two) times daily.     [DISCONTINUED] SUMAtriptan (IMITREX) 50 MG tablet TAKE 1 TABLET BY MOUTH AT THE ONSET OF A MIGRAINE. MAY REPEAT IN 2 HOURS IF HEADACHE PERSISTS OR RECURS. MAXIMUM DAILY DOSE 200 MG 9 tablet 2   [DISCONTINUED] SYMBICORT 80-4.5 MCG/ACT inhaler SMARTSIG:2 Puff(s) By Mouth Twice Daily     [DISCONTINUED] topiramate (TOPAMAX) 25 MG tablet      No current facility-administered medications on file prior to visit.    Allergies  Allergen Reactions   Penicillins Anaphylaxis    Has patient had a PCN reaction causing immediate rash, facial/tongue/throat swelling, SOB or lightheadedness with hypotension: Yes Has patient had a PCN reaction causing severe rash involving mucus membranes or skin necrosis: Yes Has patient had a PCN reaction that required hospitalization Yes Has patient had a PCN reaction occurring within the last 10 years: No-more than 10 years ago If all of the above answers are "NO", then may proceed with Cephalosporin use.    Penicillins Anaphylaxis    DID THE REACTION INVOLVE: Swelling of the face/tongue/throat, SOB, or low BP? Yes Sudden or severe rash/hives, skin peeling, or the inside of the mouth or nose? Yes Did it require medical treatment? No When did it last happen? Within the past 10 years If all above answers are "NO", may proceed with cephalosporin use.     Asa [Aspirin] Rash   Aspirin Rash    Objective:  General: Alert and oriented x3 in no acute distress  Dermatology: Keratotic  lesion present plantar foot right greater than left at the cuboid and lateral heel on the right and submet 5 on the left with skin lines transversing the lesion, pain is present with direct pressure to the lesion with a central nucleated core noted, that appears to be improving, no webspace macerations, no ecchymosis bilateral, all nails x 8 are short and thickened, there is minimal nails noted to bilateral hallux from previous history of nail removal.  Vascular: Dorsalis Pedis and Posterior Tibial pedal pulses 1/4, Capillary Fill Time 5 seconds, decreased pedal hair growth bilateral, no edema bilateral lower extremities, Temperature gradient within normal limits.  Neurology: Michaell Cowing sensation intact via light touch bilateral.    Musculoskeletal: Mild tenderness with palpation at the keratotic lesion site on Right>Left, Muscular strength 5/5 in all  groups without pain or limitation on range of motion.  Pes planus foot deformity noted bilateral.  Assessment and Plan: Problem List Items Addressed This Visit       Nervous and Auditory   Neuropathy   Other Visit Diagnoses     Plantar wart    -  Primary   Porokeratosis       Foot pain, bilateral           -Complete examination performed -Re-Discussed treatment options -Parred keratoic lesion using a chisel x3 blade; treated the area with Salinocaine to area -Continue with good supportive shoes daily for foot type like previous -Patient to return to office 4-6 weeks for follow-up on keratotic lesions or sooner if condition worsens.  Asencion Islam, DPM

## 2021-06-20 ENCOUNTER — Ambulatory Visit
Admission: RE | Admit: 2021-06-20 | Discharge: 2021-06-20 | Disposition: A | Discharge status: Home / Self Care | Payer: Medicare Other | Source: Ambulatory Visit | Attending: Family Medicine | Admitting: Family Medicine

## 2021-06-20 DIAGNOSIS — Z1231 Encounter for screening mammogram for malignant neoplasm of breast: Secondary | ICD-10-CM

## 2021-07-07 ENCOUNTER — Ambulatory Visit
Admission: RE | Admit: 2021-07-07 | Discharge: 2021-07-07 | Disposition: A | Discharge status: Home / Self Care | Payer: Medicare Other | Source: Ambulatory Visit | Attending: Orthopedic Surgery | Admitting: Orthopedic Surgery

## 2021-07-07 DIAGNOSIS — M542 Cervicalgia: Secondary | ICD-10-CM

## 2021-07-11 ENCOUNTER — Other Ambulatory Visit: Payer: Self-pay | Admitting: Orthopedic Surgery

## 2021-07-11 DIAGNOSIS — M5416 Radiculopathy, lumbar region: Secondary | ICD-10-CM

## 2021-07-24 ENCOUNTER — Ambulatory Visit
Admission: RE | Admit: 2021-07-24 | Discharge: 2021-07-24 | Disposition: A | Discharge status: Home / Self Care | Payer: Medicare Other | Source: Ambulatory Visit | Attending: Family Medicine | Admitting: Family Medicine

## 2021-07-24 ENCOUNTER — Ambulatory Visit (INDEPENDENT_AMBULATORY_CARE_PROVIDER_SITE_OTHER): Payer: Medicare Other | Admitting: Sports Medicine

## 2021-07-24 ENCOUNTER — Other Ambulatory Visit: Payer: Self-pay

## 2021-07-24 ENCOUNTER — Encounter: Payer: Self-pay | Admitting: Sports Medicine

## 2021-07-24 DIAGNOSIS — Z780 Asymptomatic menopausal state: Secondary | ICD-10-CM

## 2021-07-24 DIAGNOSIS — Q828 Other specified congenital malformations of skin: Secondary | ICD-10-CM

## 2021-07-24 DIAGNOSIS — G629 Polyneuropathy, unspecified: Secondary | ICD-10-CM

## 2021-07-24 DIAGNOSIS — B070 Plantar wart: Secondary | ICD-10-CM | POA: Diagnosis not present

## 2021-07-24 DIAGNOSIS — L603 Nail dystrophy: Secondary | ICD-10-CM

## 2021-07-24 DIAGNOSIS — M79672 Pain in left foot: Secondary | ICD-10-CM

## 2021-07-24 DIAGNOSIS — M79671 Pain in right foot: Secondary | ICD-10-CM

## 2021-07-24 NOTE — Progress Notes (Signed)
Subjective: Amanda [name] is a 55 y.o. female patient who returns to office for follow up to callus skin/wart.  Patient reports that she is having tingling in her arm and was found to have a pinched nerve. In a lot of pain but is managing, will have to have surgery.  No other pedal complaints noted.  Patient Active Problem List   Diagnosis Date Noted   Bipolar I disorder, most recent episode depressed, severe w psychosis (HCC) 02/25/2021   Alcohol abuse 02/25/2021   Abnormal weight loss 12/19/2020   Change in bowel habit 12/19/2020   Colon cancer screening 12/19/2020   Diarrhea 12/19/2020   Dysphagia 12/19/2020   Epigastric pain 12/19/2020   Family history of malignant neoplasm of gastrointestinal tract 12/19/2020   Non-ST elevation (NSTEMI) myocardial infarction [facility])    Contusion of chest 08/26/2018   Cardiac arrest (HCC) 08/26/2018   Cocaine abuse (HCC) 08/26/2018   HLD (hyperlipidemia) 08/26/2018   Elevated LFTs 08/26/2018   History of migraine    Degenerative joint disease 07/26/2017   Morbid obesity (HCC) 12/24/2016   Hyperlipidemia 12/23/2016   Painful orthopaedic hardware (HCC) 03/24/2016   Corns and callosities 03/24/2016   PUD (peptic ulcer disease) 01/02/2016   Onychomycosis 01/02/2016   Seasonal allergies 11/19/2015   GERD (gastroesophageal reflux disease) 08/06/2015   Asthma 08/06/2015   Migraine 08/06/2015   Bipolar disorder (HCC) 07/25/2015   Neuropathy 07/08/2015   Major depressive disorder, recurrent, severe without psychotic features (HCC)    Alcohol use disorder, severe, dependence (HCC) 11/16/2014   Cocaine use disorder, severe, dependence (HCC) 11/16/2014   Acute calculous cholecystitis s/p lap chole 02/09/2014 02/09/2014   HTN (hypertension) 07/25/2013   Carpal tunnel syndrome 11/18/2012   Lumbar radiculopathy 11/18/2012   Anxiety state, unspecified 11/18/2012    Current Outpatient Medications on File Prior to Visit  Medication Sig Dispense  Refill   albuterol (VENTOLIN HFA) 108 (90 Base) MCG/ACT inhaler Inhale 2 puffs into the lungs every 4 (four) hours as needed for wheezing or shortness of breath.     ALPRAZolam (XANAX) 0.5 MG tablet Take 0.5 mg by mouth daily as needed.     amLODipine (NORVASC) 5 MG tablet Take 1 tablet (5 mg total) by mouth daily. 30 tablet 6   atorvastatin (LIPITOR) 10 MG tablet Take 10 mg by mouth at bedtime.     atorvastatin (LIPITOR) 40 MG tablet Take 1 tablet (40 mg total) by mouth daily. 30 tablet 6   benztropine (COGENTIN) 0.5 MG tablet Take 1 tablet (0.5 mg total) by mouth 2 (two) times daily as needed for tremors (EPS). 60 tablet 0   buPROPion (WELLBUTRIN SR) 150 MG 12 hr tablet Take 150 mg by mouth daily.     cyclobenzaprine (FLEXERIL) 10 MG tablet Take 10 mg by mouth 3 (three) times daily.     diclofenac (VOLTAREN) 75 MG EC tablet Take 1 tablet (75 mg total) by mouth 2 (two) times daily. 60 tablet 0   DULoxetine (CYMBALTA) 60 MG capsule Take 1 capsule (60 mg total) by mouth daily. 30 capsule 0   fluticasone (FLONASE) 50 MCG/ACT nasal spray Place into both nostrils.     gabapentin (NEURONTIN) 100 MG capsule Take 1 capsule (100 mg total) by mouth 3 (three) times daily. 90 capsule 0   haloperidol (HALDOL) 10 MG tablet Take 10 mg by mouth 2 (two) times daily.     haloperidol (HALDOL) 5 MG tablet Take 1 tablet (5 mg total) by mouth 2 (two) times  daily. 60 tablet 0   HYDROcodone-acetaminophen (NORCO) 10-325 MG tablet Take 1 tablet by mouth every 4 (four) hours as needed.     hydrOXYzine (ATARAX/VISTARIL) 50 MG tablet Take 50 mg by mouth 3 (three) times daily as needed.     isosorbide mononitrate (IMDUR) 30 MG 24 hr tablet Take 2.5 tablets (75 mg total) by mouth daily. 75 tablet 0   meloxicam (MOBIC) 7.5 MG tablet Take 7.5 mg by mouth daily.     metoprolol tartrate (LOPRESSOR) 25 MG tablet Take 1 tablet (25 mg total) by mouth 2 (two) times daily. 60 tablet 0   pantoprazole (PROTONIX) 40 MG tablet Take 40 mg  by mouth every morning.     trazodone (DESYREL) 300 MG tablet Take 300 mg by mouth at bedtime.     venlafaxine XR (EFFEXOR-XR) 37.5 MG 24 hr capsule Take 37.5 mg by mouth daily.     [DISCONTINUED] cetirizine (ZYRTEC) 10 MG tablet Take 1 tablet (10 mg total) by mouth daily. 30 tablet 5   [DISCONTINUED] cloNIDine (CATAPRES) 0.1 MG tablet Take 1 tablet (0.1 mg total) by mouth at bedtime as needed. For hot flashes 30 tablet 3   [DISCONTINUED] ipratropium (ATROVENT) 0.03 % nasal spray Place 2 sprays into both nostrils 2 (two) times daily.     [DISCONTINUED] SUMAtriptan (IMITREX) 50 MG tablet TAKE 1 TABLET BY MOUTH AT THE ONSET OF A MIGRAINE. MAY REPEAT IN 2 HOURS IF HEADACHE PERSISTS OR RECURS. MAXIMUM DAILY DOSE 200 MG 9 tablet 2   [DISCONTINUED] SYMBICORT 80-4.5 MCG/ACT inhaler SMARTSIG:2 Puff(s) By Mouth Twice Daily     [DISCONTINUED] topiramate (TOPAMAX) 25 MG tablet      No current facility-administered medications on file prior to visit.    Allergies  Allergen Reactions   Penicillins Anaphylaxis    Has patient had a PCN reaction causing immediate rash, facial/tongue/throat swelling, SOB or lightheadedness with hypotension: Yes Has patient had a PCN reaction causing severe rash involving mucus membranes or skin necrosis: Yes Has patient had a PCN reaction that required hospitalization Yes Has patient had a PCN reaction occurring within the last 10 years: No-more than 10 years ago If all of the above answers are "NO", then may proceed with Cephalosporin use.    Penicillins Anaphylaxis    DID THE REACTION INVOLVE: Swelling of the face/tongue/throat, SOB, or low BP? Yes Sudden or severe rash/hives, skin peeling, or the inside of the mouth or nose? Yes Did it require medical treatment? No When did it last happen? Within the past 10 years If all above answers are "NO", may proceed with cephalosporin use.     Asa [Aspirin] Rash   Aspirin Rash    Objective:  General: Alert and oriented x3  in no acute distress  Dermatology: Keratotic lesion present plantar foot right greater than left at the cuboid and lateral heel on the right and submet 5 on the left with skin lines transversing the lesion, pain is present with direct pressure to the lesion with a central nucleated core noted, that appears to be improving, no webspace macerations, no ecchymosis bilateral, all nails x 8 are elongated and thickened, there is minimal nails noted to bilateral hallux from previous history of nail removal.  Vascular: Dorsalis Pedis and Posterior Tibial pedal pulses 1/4, Capillary Fill Time 5 seconds, decreased pedal hair growth bilateral, no edema bilateral lower extremities, Temperature gradient within normal limits.  Neurology: Gross sensation intact via light touch bilateral.    Musculoskeletal: Mild tenderness with palpation  at the keratotic lesion site on Right>Left, Muscular strength 5/5 in all groups without pain or limitation on range of motion.  Pes planus foot deformity noted bilateral.  Assessment and Plan: Problem List Items Addressed This Visit       Nervous and Auditory   Neuropathy   Other Visit Diagnoses     Plantar wart    -  Primary   Porokeratosis       Onychodystrophy       Foot pain, bilateral           -Complete examination performed -Re-Discussed treatment options -Mechanically debrided nails x 8 using sterile nail nipper without incident  -Parred keratoic lesion using a chisel x3 blade; treated the area with Salinocaine to area -Continue with good supportive shoes daily for foot type like previous -Patient to return to office 4-6 weeks for follow-up on keratotic lesions or sooner if condition worsens.  Asencion Islam, DPM

## 2021-07-28 ENCOUNTER — Telehealth: Payer: Self-pay

## 2021-07-28 NOTE — Telephone Encounter (Signed)
I spoke with patient over the phone, she was transferred over from [place] in billing after making a payment towards her bad debt. She advised that she did not want to see Dr Marjory Lies again, so I advised her that we could put in a provider switch request to another MD, I would just need to know which doctor she wanted to transfer her care to. She let me know she did not know any other providers, and that she wanted me to pick one for her based on her referral. I advised that I was unable to do that and gave her the names of Dr Terrace Arabia, Dr Lucia Gaskins and Dr Epimenio Foot who could see her for this diagnosis. She advised that she wanted to transfer her care to Dr Terrace Arabia after asking why our office was making her "jump through hoops" to get scheduled. I let her know this was just our office policy, and that I would get the provider switch request sent over for her, and that once we heard back from Dr Terrace Arabia, we would call her to schedule her. I did also advise her that all of our providers are currently scheduling into the beginning of February. She advised that she could not wait that long, and that she didn't know why we made her make a payment towards her bad debt if she was not able to schedule an appointment today. I advised her that I could get her scheduled with Dr Marjory Lies today, but that, per office policy, if she wanted to transfer her care to another provider, I needed approval before doing so. She let me know that she was going to discuss this with Dr Yevette Edwards, referring provider, and let him know how far our we were scheduling. I advised her she could definitely give him a call and see if he had any recommendations for her, and that once the provider switch was approved, we could definitely add her to the cancellation list for a sooner appointment. She questioned what would happen if we had an earlier appointment on a day that she already had another appointment, and I advised that we would just call back with the next  available opening after that.   The call was disconnected from her end before I was able to schedule her for an appointment.

## 2021-07-28 NOTE — Telephone Encounter (Signed)
Reviewed evaluation by Dr. Marjory Lies in 2017, confirmed the diagnosis of carpal tunnel, patient was mildly anxious during interview, pressed speech, tangential thoughts process  Nerve conduction study in July 2014 showed moderate left carpal tunnel syndrome, right side is moderate borderline, well-preserved bilateral sural sensory response, peroneal to EDB motor response, normal tibial distal CMAP amplitude, suboptimal stimulation at proximal stimulation site, F-wave latency was within normal limits  She also showed disrupted behavior to staff at today's prolonged phone conversation,  Have a history of bad debt to our office,  I would suggest not seeing this patient at our office

## 2021-07-28 NOTE — Telephone Encounter (Signed)
We received a referral from Dr Yevette Edwards for this patient, last seen by Dr Marjory Lies in 2014. She is requesting a provider switch to Dr Terrace Arabia for Neck pain with bilateral arm pain - numbness and tingling.  Please advise

## 2021-08-06 ENCOUNTER — Ambulatory Visit (INDEPENDENT_AMBULATORY_CARE_PROVIDER_SITE_OTHER): Payer: Medicare Other

## 2021-08-06 ENCOUNTER — Other Ambulatory Visit (HOSPITAL_COMMUNITY)
Admission: RE | Admit: 2021-08-06 | Discharge: 2021-08-06 | Disposition: A | Discharge status: Home / Self Care | Payer: Medicare Other | Source: Ambulatory Visit

## 2021-08-06 ENCOUNTER — Other Ambulatory Visit: Payer: Self-pay

## 2021-08-06 VITALS — Ht 67.0 in | Wt 233.6 lb

## 2021-08-06 DIAGNOSIS — Z113 Encounter for screening for infections with a predominantly sexual mode of transmission: Secondary | ICD-10-CM | POA: Diagnosis not present

## 2021-08-06 DIAGNOSIS — Z1151 Encounter for screening for human papillomavirus (HPV): Secondary | ICD-10-CM | POA: Diagnosis not present

## 2021-08-06 DIAGNOSIS — Z124 Encounter for screening for malignant neoplasm of cervix: Secondary | ICD-10-CM | POA: Diagnosis not present

## 2021-08-06 DIAGNOSIS — N950 Postmenopausal bleeding: Secondary | ICD-10-CM | POA: Diagnosis present

## 2021-08-06 DIAGNOSIS — Z01419 Encounter for gynecological examination (general) (routine) without abnormal findings: Secondary | ICD-10-CM | POA: Diagnosis present

## 2021-08-06 NOTE — Progress Notes (Signed)
° °  Subjective:     Amanda [name] is a 55 y.o. female here at [facility] for a routine exam.  She is reporting postmenopausal bleeding. Patient reports that she has not had a period for the past 4-5 years and about 5 months ago had an episode of very heavy bleeding for 1 day. She has not had any bleeding since. She also reports that she has not had a pap smear in at least 10 years, but does have a history of abnormal paps in the past. She is not currently sexually active and has not been for at least 6 years. She does request STD testing. She is also requesting medications to help with her hot flashes.    Flowsheet Row Office Visit from 09/13/2018 in [facility]  PHQ-2 Total Score 4       Health Maintenance Due  Topic Date Due   Pneumococcal Vaccine 35-25 Years old (1 - PCV) Never done   Zoster Vaccines- Shingrix (1 of 2) Never done   PAP SMEAR-Modifier  06/06/2019   COVID-19 Vaccine (3 - Booster for Moderna series) 10/25/2020    Risk factors for chronic health problems: Smoking: denies Alchohol/how much: denies Illicit drug use: past history, denies current use Pt BMI: Body mass index is 36.59 kg/m.   Gynecologic History No LMP recorded. (Menstrual status: Perimenopausal). Contraception: post menopausal status Last Pap: unsure.  Last mammogram: 06/20/2021. Results were: normal  Obstetric History OB History  Gravida Para Term Preterm AB Living  0 0 0 0 0 0  SAB IAB Ectopic Multiple Live Births  0 0 0 0 0    The following portions of the patient's history were reviewed and updated as appropriate: allergies, current medications, past family history, past medical history, past social history, past surgical history, and problem list.  Review of Systems Pertinent items are noted in HPI.    Objective:   Ht 5\' 7"  (1.702 m)    Wt 233 lb 9.6 oz (106 kg)    BMI 36.59 kg/m  VS reviewed, nursing note reviewed,  Constitutional: well developed,  well nourished, no distress HEENT: normocephalic CV: normal rate Pulm/chest wall: normal effort Breast Exam: Deferred  Abdomen: soft Neuro: alert and oriented x 3 Skin: warm, dry Psych: affect normal Pelvic exam: Performed: Cervix pink, visually closed, without lesion, scant white creamy discharge, vaginal walls pink with minimal rugae, and external genitalia normal Bimanual exam: Cervix 0/long/high, firm, anterior, neg CMT, uterus nontender, nonenlarged, adnexa without tenderness, enlargement, or mass     Assessment/Plan:   1. Postmenopausal bleeding - Discussed with patient that she will likely need an EMB given postmenopausal bleeding. She will be referred to another office for this. - Will go ahead and order pelvic ultrasound while waiting for EMB.  - Discussed with patient that given unknown cause of bleeding, I would not recommend starting any hormonal therapy for hot flashes and that she would need to discuss this further with MD after having EMB  - Pelvis Complete; Future - Cervicovaginal ancillary only( [place])  2. Cervical cancer screening  - Cytology - PAP( [place])  3. Screening examination for STD (sexually transmitted disease)  - Cervicovaginal ancillary only( [place])     Follow up at [facility] on 09/29/2021 as scheduled for EMB.    11/27/2021, CNM 08/06/21 1:10 PM

## 2021-08-07 LAB — CERVICOVAGINAL ANCILLARY ONLY
Chlamydia: NEGATIVE
Comment: NEGATIVE
Comment: NEGATIVE
Comment: NORMAL
Neisseria Gonorrhea: NEGATIVE
Trichomonas: NEGATIVE

## 2021-08-08 ENCOUNTER — Other Ambulatory Visit: Payer: Self-pay

## 2021-08-08 ENCOUNTER — Ambulatory Visit
Admission: RE | Admit: 2021-08-08 | Discharge: 2021-08-08 | Disposition: A | Discharge status: Home / Self Care | Payer: Medicare Other | Source: Ambulatory Visit | Attending: Orthopedic Surgery | Admitting: Orthopedic Surgery

## 2021-08-08 DIAGNOSIS — M5416 Radiculopathy, lumbar region: Secondary | ICD-10-CM

## 2021-08-11 LAB — CYTOLOGY - PAP
Comment: NEGATIVE
Diagnosis: NEGATIVE
High risk HPV: NEGATIVE

## 2021-08-13 ENCOUNTER — Ambulatory Visit (HOSPITAL_COMMUNITY): Admission: RE | Admit: 2021-08-13 | Payer: Medicare Other | Source: Ambulatory Visit

## 2021-08-28 ENCOUNTER — Other Ambulatory Visit: Payer: Self-pay

## 2021-08-28 ENCOUNTER — Ambulatory Visit (INDEPENDENT_AMBULATORY_CARE_PROVIDER_SITE_OTHER): Payer: Medicare Other | Admitting: Sports Medicine

## 2021-08-28 ENCOUNTER — Ambulatory Visit: Payer: Medicare Other | Admitting: Sports Medicine

## 2021-08-28 DIAGNOSIS — Q828 Other specified congenital malformations of skin: Secondary | ICD-10-CM | POA: Diagnosis not present

## 2021-08-28 DIAGNOSIS — G629 Polyneuropathy, unspecified: Secondary | ICD-10-CM

## 2021-08-28 DIAGNOSIS — M79672 Pain in left foot: Secondary | ICD-10-CM

## 2021-08-28 DIAGNOSIS — B070 Plantar wart: Secondary | ICD-10-CM

## 2021-08-28 DIAGNOSIS — M79671 Pain in right foot: Secondary | ICD-10-CM

## 2021-08-28 DIAGNOSIS — L603 Nail dystrophy: Secondary | ICD-10-CM | POA: Diagnosis not present

## 2021-08-28 NOTE — Progress Notes (Signed)
Subjective: Amanda [name] is a 56 y.o. female patient who returns to office for follow up to callus skin/wart.  Patient reports that her feet is doing good however had a medical emergency and went to the hospital and was diagnosed with congestive heart failure.  Patient reports that she knows she has a lot of medical issues but is trusting in God.  Patient Active Problem List   Diagnosis Date Noted   Bipolar I disorder, most recent episode depressed, severe w psychosis ([place]) 02/25/2021   Alcohol abuse 02/25/2021   Abnormal weight loss 12/19/2020   Change in bowel habit 12/19/2020   Colon cancer screening 12/19/2020   Diarrhea 12/19/2020   Dysphagia 12/19/2020   Epigastric pain 12/19/2020   Family history of malignant neoplasm of gastrointestinal tract 12/19/2020   Non-ST elevation (NSTEMI) myocardial infarction [facility])    Contusion of chest 08/26/2018   Cardiac arrest ([place]) 08/26/2018   Cocaine abuse ([place]) 08/26/2018   HLD (hyperlipidemia) 08/26/2018   Elevated LFTs 08/26/2018   History of migraine    Degenerative joint disease 07/26/2017   Morbid obesity ([place]) 12/24/2016   Hyperlipidemia 12/23/2016   Painful orthopaedic hardware ([place]) 03/24/2016   Corns and callosities 03/24/2016   PUD (peptic ulcer disease) 01/02/2016   Onychomycosis 01/02/2016   Seasonal allergies 11/19/2015   GERD (gastroesophageal reflux disease) 08/06/2015   Asthma 08/06/2015   Migraine 08/06/2015   Bipolar disorder ([place]) 07/25/2015   Neuropathy 07/08/2015   Major depressive disorder, recurrent, severe without psychotic features ([place])    Alcohol use disorder, severe, dependence ([place]) 11/16/2014   Cocaine use disorder, severe, dependence ([place]) 11/16/2014   Acute calculous cholecystitis s/p lap chole 02/09/2014 02/09/2014   HTN (hypertension) 07/25/2013   Carpal tunnel syndrome 11/18/2012   Lumbar radiculopathy 11/18/2012   Anxiety state, unspecified 11/18/2012    Current Outpatient Medications on  File Prior to Visit  Medication Sig Dispense Refill   albuterol (VENTOLIN HFA) 108 (90 Base) MCG/ACT inhaler Inhale 2 puffs into the lungs every 4 (four) hours as needed for wheezing or shortness of breath.     ALPRAZolam (XANAX) 0.5 MG tablet Take 0.5 mg by mouth daily as needed.     amLODipine (NORVASC) 5 MG tablet Take 1 tablet (5 mg total) by mouth daily. 30 tablet 6   atorvastatin (LIPITOR) 10 MG tablet Take 10 mg by mouth at bedtime. (Patient not taking: Reported on 08/06/2021)     atorvastatin (LIPITOR) 40 MG tablet Take 1 tablet (40 mg total) by mouth daily. (Patient not taking: Reported on 08/06/2021) 30 tablet 6   benztropine (COGENTIN) 0.5 MG tablet Take 1 tablet (0.5 mg total) by mouth 2 (two) times daily as needed for tremors (EPS). 60 tablet 0   buPROPion (WELLBUTRIN SR) 150 MG 12 hr tablet Take 150 mg by mouth daily.     cyclobenzaprine (FLEXERIL) 10 MG tablet Take 10 mg by mouth 3 (three) times daily.     diclofenac (VOLTAREN) 75 MG EC tablet Take 1 tablet (75 mg total) by mouth 2 (two) times daily. 60 tablet 0   DULoxetine (CYMBALTA) 60 MG capsule Take 1 capsule (60 mg total) by mouth daily. 30 capsule 0   fluticasone (FLONASE) 50 MCG/ACT nasal spray Place into both nostrils. (Patient not taking: Reported on 08/06/2021)     gabapentin (NEURONTIN) 100 MG capsule Take 1 capsule (100 mg total) by mouth 3 (three) times daily. 90 capsule 0   haloperidol (HALDOL) 10 MG tablet Take 10 mg by mouth  2 (two) times daily.     haloperidol (HALDOL) 5 MG tablet Take 1 tablet (5 mg total) by mouth 2 (two) times daily. 60 tablet 0   HYDROcodone-acetaminophen (NORCO) 10-325 MG tablet Take 1 tablet by mouth every 4 (four) hours as needed.     hydrOXYzine (ATARAX/VISTARIL) 50 MG tablet Take 50 mg by mouth 3 (three) times daily as needed.     isosorbide mononitrate (IMDUR) 30 MG 24 hr tablet Take 2.5 tablets (75 mg total) by mouth daily. 75 tablet 0   meloxicam (MOBIC) 7.5 MG tablet Take 7.5 mg by  mouth daily.     metoprolol tartrate (LOPRESSOR) 25 MG tablet Take 1 tablet (25 mg total) by mouth 2 (two) times daily. 60 tablet 0   pantoprazole (PROTONIX) 40 MG tablet Take 40 mg by mouth every morning. (Patient not taking: Reported on 08/06/2021)     trazodone (DESYREL) 300 MG tablet Take 300 mg by mouth at bedtime.     venlafaxine XR (EFFEXOR-XR) 37.5 MG 24 hr capsule Take 37.5 mg by mouth daily.     [DISCONTINUED] cetirizine (ZYRTEC) 10 MG tablet Take 1 tablet (10 mg total) by mouth daily. 30 tablet 5   [DISCONTINUED] cloNIDine (CATAPRES) 0.1 MG tablet Take 1 tablet (0.1 mg total) by mouth at bedtime as needed. For hot flashes 30 tablet 3   [DISCONTINUED] ipratropium (ATROVENT) 0.03 % nasal spray Place 2 sprays into both nostrils 2 (two) times daily.     [DISCONTINUED] SUMAtriptan (IMITREX) 50 MG tablet TAKE 1 TABLET BY MOUTH AT THE ONSET OF A MIGRAINE. MAY REPEAT IN 2 HOURS IF HEADACHE PERSISTS OR RECURS. MAXIMUM DAILY DOSE 200 MG 9 tablet 2   [DISCONTINUED] SYMBICORT 80-4.5 MCG/ACT inhaler SMARTSIG:2 Puff(s) By Mouth Twice Daily     [DISCONTINUED] topiramate (TOPAMAX) 25 MG tablet      No current facility-administered medications on file prior to visit.    Allergies  Allergen Reactions   Penicillins Anaphylaxis    Has patient had a PCN reaction causing immediate rash, facial/tongue/throat swelling, SOB or lightheadedness with hypotension: Yes Has patient had a PCN reaction causing severe rash involving mucus membranes or skin necrosis: Yes Has patient had a PCN reaction that required hospitalization Yes Has patient had a PCN reaction occurring within the last 10 years: No-more than 10 years ago If all of the above answers are "NO", then may proceed with Cephalosporin use.    Penicillins Anaphylaxis    DID THE REACTION INVOLVE: Swelling of the face/tongue/throat, SOB, or low BP? Yes Sudden or severe rash/hives, skin peeling, or the inside of the mouth or nose? Yes Did it require  medical treatment? No When did it last happen? Within the past 10 years If all above answers are NO, may proceed with cephalosporin use.     Asa [Aspirin] Rash   Aspirin Rash    Objective:  General: Alert and oriented x3 in no acute distress  Dermatology: Keratotic lesion present plantar foot right greater than left at the cuboid and lateral heel on the right and submet 5 on the left with skin lines transversing the lesion, pain is present with direct pressure to the lesion with a central nucleated core noted, that appears to be improving, no webspace macerations, no ecchymosis bilateral, all nails x 8 are mildly elongated and thickened, there is minimal nails noted to bilateral hallux from previous history of nail removal.  Vascular: Dorsalis Pedis and Posterior Tibial pedal pulses 1/4, Capillary Fill Time 5 seconds, decreased  pedal hair growth bilateral, no edema bilateral lower extremities, Temperature gradient within normal limits.  Neurology: Johney Maine sensation intact via light touch bilateral.    Musculoskeletal: Mild tenderness with palpation at the keratotic lesion site on Right>Left, Muscular strength 5/5 in all groups without pain or limitation on range of motion.  Pes planus foot deformity noted bilateral.  Assessment and Plan: Problem List Items Addressed This Visit       Nervous and Auditory   Neuropathy   Other Visit Diagnoses     Plantar wart    -  Primary   Porokeratosis       Onychodystrophy       Foot pain, bilateral           -Complete examination performed -Re-Discussed treatment options -Mechanically debrided nails x 8 using sterile nail nipper without incident  -Parred keratoic lesion using a chisel x3 blade; treated the area with Salinocaine to area -Continue with good supportive shoes daily for foot type like previous -Patient to return to office 4-6 weeks for follow-up on keratotic lesions or sooner if condition worsens.  Landis Martins, DPM

## 2021-09-05 DIAGNOSIS — G894 Chronic pain syndrome: Secondary | ICD-10-CM | POA: Insufficient documentation

## 2021-09-12 DIAGNOSIS — R899 Unspecified abnormal finding in specimens from other organs, systems and tissues: Secondary | ICD-10-CM | POA: Insufficient documentation

## 2021-09-25 ENCOUNTER — Ambulatory Visit (INDEPENDENT_AMBULATORY_CARE_PROVIDER_SITE_OTHER): Payer: Medicare Other | Admitting: Sports Medicine

## 2021-09-25 ENCOUNTER — Encounter: Payer: Self-pay | Admitting: Sports Medicine

## 2021-09-25 DIAGNOSIS — G629 Polyneuropathy, unspecified: Secondary | ICD-10-CM

## 2021-09-25 DIAGNOSIS — B070 Plantar wart: Secondary | ICD-10-CM

## 2021-09-25 DIAGNOSIS — L603 Nail dystrophy: Secondary | ICD-10-CM

## 2021-09-25 DIAGNOSIS — M79672 Pain in left foot: Secondary | ICD-10-CM

## 2021-09-25 DIAGNOSIS — Q828 Other specified congenital malformations of skin: Secondary | ICD-10-CM

## 2021-09-25 DIAGNOSIS — M79671 Pain in right foot: Secondary | ICD-10-CM

## 2021-09-25 NOTE — Progress Notes (Signed)
Subjective: Amanda [name] is a 56 y.o. female patient who returns to office for follow up to callus skin/wart.  Patient reports that she is going through a lot right now but is staying hopeful with prayer.  Patient Active Problem List   Diagnosis Date Noted   Bipolar I disorder, most recent episode depressed, severe w psychosis (HCC) 02/25/2021   Alcohol abuse 02/25/2021   Abnormal weight loss 12/19/2020   Change in bowel habit 12/19/2020   Colon cancer screening 12/19/2020   Diarrhea 12/19/2020   Dysphagia 12/19/2020   Epigastric pain 12/19/2020   Family history of malignant neoplasm of gastrointestinal tract 12/19/2020   Non-ST elevation (NSTEMI) myocardial infarction [facility])    Contusion of chest 08/26/2018   Cardiac arrest (HCC) 08/26/2018   Cocaine abuse (H[facility]) 08/26/2018   HLD (hyperlipidemia) 08/26/2018   Elevated LFTs 08/26/2018   History of migraine    Degenerative joint disease 07/26/2017   Morbid obesity (HCC) 12/24/2016   Hyperlipidemia 12/23/2016   Painful orthopaedic hardware ([facility]) 03/24/2016   Corns and callosities 03/24/2016   PUD (peptic ulcer disease) 01/02/2016   Onychomycosis 01/02/2016   Seasonal allergies 11/19/2015   GERD (gastroesophageal reflux disease) 08/06/2015   Asthma 08/06/2015   Migraine 08/06/2015   Bipolar disorder (HCC) 07/25/2015   Neuropathy 07/08/2015   Major depressive disorder, recurrent, severe without psychotic features ([facility])    Alcohol use disorder, severe, dependence (HCC) 11/16/2014   Cocaine use disorder, severe, dependence (H[facility]) 11/16/2014   Acute calculous cholecystitis s/p lap chole 02/09/2014 02/09/2014   HTN (hypertension) 07/25/2013   Carpal tunnel syndrome 11/18/2012   Lumbar radiculopathy 11/18/2012   Anxiety state, unspecified 11/18/2012    Current Outpatient Medications on File Prior to Visit  Medication Sig Dispense Refill   albuterol (VENTOLIN HFA) 108 (90 Base) MCG/ACT inhaler Inhale 2 puffs into the lungs  every 4 (four) hours as needed for wheezing or shortness of breath.     ALPRAZolam (XANAX) 0.5 MG tablet Take 0.5 mg by mouth daily as needed.     amLODipine (NORVASC) 5 MG tablet Take 1 tablet (5 mg total) by mouth daily. 30 tablet 6   atorvastatin (LIPITOR) 10 MG tablet Take 10 mg by mouth at bedtime. (Patient not taking: Reported on 08/06/2021)     atorvastatin (LIPITOR) 40 MG tablet Take 1 tablet (40 mg total) by mouth daily. (Patient not taking: Reported on 08/06/2021) 30 tablet 6   benztropine (COGENTIN) 0.5 MG tablet Take 1 tablet (0.5 mg total) by mouth 2 (two) times daily as needed for tremors (EPS). 60 tablet 0   buPROPion (WELLBUTRIN SR) 150 MG 12 hr tablet Take 150 mg by mouth daily.     cyclobenzaprine (FLEXERIL) 10 MG tablet Take 10 mg by mouth 3 (three) times daily.     diclofenac (VOLTAREN) 75 MG EC tablet Take 1 tablet (75 mg total) by mouth 2 (two) times daily. 60 tablet 0   DULoxetine (CYMBALTA) 60 MG capsule Take 1 capsule (60 mg total) by mouth daily. 30 capsule 0   fluticasone (FLONASE) 50 MCG/ACT nasal spray Place into both nostrils. (Patient not taking: Reported on 08/06/2021)     gabapentin (NEURONTIN) 100 MG capsule Take 1 capsule (100 mg total) by mouth 3 (three) times daily. 90 capsule 0   haloperidol (HALDOL) 10 MG tablet Take 10 mg by mouth 2 (two) times daily.     haloperidol (HALDOL) 5 MG tablet Take 1 tablet (5 mg total) by mouth 2 (two) times daily. 60  tablet 0   HYDROcodone-acetaminophen (NORCO) 10-325 MG tablet Take 1 tablet by mouth every 4 (four) hours as needed.     hydrOXYzine (ATARAX/VISTARIL) 50 MG tablet Take 50 mg by mouth 3 (three) times daily as needed.     isosorbide mononitrate (IMDUR) 30 MG 24 hr tablet Take 2.5 tablets (75 mg total) by mouth daily. 75 tablet 0   meloxicam (MOBIC) 7.5 MG tablet Take 7.5 mg by mouth daily.     metoprolol tartrate (LOPRESSOR) 25 MG tablet Take 1 tablet (25 mg total) by mouth 2 (two) times daily. 60 tablet 0    pantoprazole (PROTONIX) 40 MG tablet Take 40 mg by mouth every morning. (Patient not taking: Reported on 08/06/2021)     trazodone (DESYREL) 300 MG tablet Take 300 mg by mouth at bedtime.     venlafaxine XR (EFFEXOR-XR) 37.5 MG 24 hr capsule Take 37.5 mg by mouth daily.     [DISCONTINUED] cetirizine (ZYRTEC) 10 MG tablet Take 1 tablet (10 mg total) by mouth daily. 30 tablet 5   [DISCONTINUED] cloNIDine (CATAPRES) 0.1 MG tablet Take 1 tablet (0.1 mg total) by mouth at bedtime as needed. For hot flashes 30 tablet 3   [DISCONTINUED] ipratropium (ATROVENT) 0.03 % nasal spray Place 2 sprays into both nostrils 2 (two) times daily.     [DISCONTINUED] SUMAtriptan (IMITREX) 50 MG tablet TAKE 1 TABLET BY MOUTH AT THE ONSET OF A MIGRAINE. MAY REPEAT IN 2 HOURS IF HEADACHE PERSISTS OR RECURS. MAXIMUM DAILY DOSE 200 MG 9 tablet 2   [DISCONTINUED] SYMBICORT 80-4.5 MCG/ACT inhaler SMARTSIG:2 Puff(s) By Mouth Twice Daily     [DISCONTINUED] topiramate (TOPAMAX) 25 MG tablet      No current facility-administered medications on file prior to visit.    Allergies  Allergen Reactions   Penicillins Anaphylaxis    Has patient had a PCN reaction causing immediate rash, facial/tongue/throat swelling, SOB or lightheadedness with hypotension: Yes Has patient had a PCN reaction causing severe rash involving mucus membranes or skin necrosis: Yes Has patient had a PCN reaction that required hospitalization Yes Has patient had a PCN reaction occurring within the last 10 years: No-more than 10 years ago If all of the above answers are "NO", then may proceed with Cephalosporin use.    Penicillins Anaphylaxis    DID THE REACTION INVOLVE: Swelling of the face/tongue/throat, SOB, or low BP? Yes Sudden or severe rash/hives, skin peeling, or the inside of the mouth or nose? Yes Did it require medical treatment? No When did it last happen? Within the past 10 years If all above answers are NO, may proceed with cephalosporin  use.     Asa [Aspirin] Rash   Aspirin Rash    Objective:  General: Alert and oriented x3 in no acute distress  Dermatology: Keratotic lesion present plantar foot right greater than left at the cuboid and lateral heel on the right and submet 5 on the left with skin lines transversing the lesion, pain is present with direct pressure to the lesion with a central nucleated core noted, that appears to be improving, no webspace macerations, no ecchymosis bilateral, all nails x 8 are mildly elongated and thickened, there is minimal nails noted to bilateral hallux from previous history of nail removal.  Vascular: Dorsalis Pedis and Posterior Tibial pedal pulses 1/4, Capillary Fill Time 5 seconds, decreased pedal hair growth bilateral, no edema bilateral lower extremities, Temperature gradient within normal limits.  Neurology: Gross sensation intact via light touch bilateral.  Musculoskeletal: Mild tenderness with palpation at the keratotic lesion site on Right>Left, Muscular strength 5/5 in all groups without pain or limitation on range of motion.  Pes planus foot deformity noted bilateral.  Assessment and Plan: Problem List Items Addressed This Visit       Nervous and Auditory   Neuropathy   Other Visit Diagnoses     Plantar wart    -  Primary   Porokeratosis       Onychodystrophy       Foot pain, bilateral           -Complete examination performed -Mechanically debrided nails x 8 using sterile nail nipper without incident at no charge -Parred keratoic lesion using a chisel x3 blade; treated the area with Salinocaine to area covered with Band-Aids and advised patient to leave in place until tomorrow -Continue with good supportive shoes daily for foot type like previous -Patient to return to office 4-6 weeks for follow-up on keratotic lesions or sooner if condition worsens.  Asencion Islamitorya Constance Whittle, DPM

## 2021-09-29 ENCOUNTER — Ambulatory Visit (INDEPENDENT_AMBULATORY_CARE_PROVIDER_SITE_OTHER): Payer: Medicare Other | Admitting: Obstetrics & Gynecology

## 2021-09-29 ENCOUNTER — Encounter: Payer: Self-pay | Admitting: Obstetrics & Gynecology

## 2021-09-29 ENCOUNTER — Other Ambulatory Visit (HOSPITAL_COMMUNITY): Payer: Medicare Other

## 2021-09-29 ENCOUNTER — Other Ambulatory Visit: Payer: Self-pay

## 2021-09-29 VITALS — BP 112/70 | HR 73 | Wt 231.0 lb

## 2021-09-29 DIAGNOSIS — N950 Postmenopausal bleeding: Secondary | ICD-10-CM | POA: Diagnosis not present

## 2021-09-29 MED ORDER — PREMPRO 0.3-1.5 MG PO TABS: 1.0000 | ORAL_TABLET | Freq: Every day | ORAL | 5 refills | Status: DC

## 2021-09-29 NOTE — Progress Notes (Signed)
Patient ID: Amanda [name], female   DOB: [birth year], 56 y.o.   MRN: [id number]  Chief Complaint  Patient presents with   Gynecologic Exam    HPI Amanda [name] is a 56 y.o. female.  [id number] She reported one episode of vaginal bleeding on one day and no bleeding since them. There is no result for pelvic US since 2018 and this shill needs to be done. She reports hot flushes and sweats that have not been treated or evaluated.  HPI  Past Medical History:  Diagnosis Date   Anxiety    Arthritis    lower back   Asthma    Bipolar disorder (HCC)    Carpal tunnel syndrome, bilateral    Chronic back pain    Depression    Diverticulosis    Fx. left wrist    GERD (gastroesophageal reflux disease)    Heart murmur    "born with"   Heart murmur    HLD (hyperlipidemia)    Hypertension    IBS (irritable bowel syndrome)    Internal hemorrhoids    Muscle spasms of neck    Neuropathy    PUD (peptic ulcer disease)     Past Surgical History:  Procedure Laterality Date   ARTHRODESIS METATARSALPHALANGEAL JOINT (MTPJ) Right 04/29/2016   Procedure: RIGHT GREAT TOE METATARSOPHALANGEAL JOINT (MTPJ) FUSION, HARDWARE REMOVAL;  Surgeon: Tarry Kos, MD;  Location: [facility];  Service: Orthopedics;  Laterality: Right;  RIGHT GREAT TOE METATARSOPHALANGEAL JOINT (MTPJ) FUSION, HARDWARE REMOVAL   CARPAL TUNNEL RELEASE Right 2003   CHOLECYSTECTOMY N/A 02/09/2014   Procedure: LAPAROSCOPIC CHOLECYSTECTOMY WITH INTRAOPERATIVE CHOLANGIOGRAM;  Surgeon: Valarie Merino, MD;  Location: [facility];  Service: General;  Laterality: N/A;   CHOLECYSTECTOMY     FOOT SURGERY     HAMMER TOE FUSION Bilateral 2008   HARDWARE REMOVAL Right 04/29/2016   Procedure: HARDWARE REMOVAL;  Surgeon: Tarry Kos, MD;  Location: [facility];  Service: Orthopedics;  Laterality: Right;  HARDWARE REMOVAL   LEFT HEART CATH AND CORONARY ANGIOGRAPHY N/A 08/30/2018   Procedure: LEFT HEART CATH AND CORONARY  ANGIOGRAPHY;  Surgeon: Marykay Lex, MD;  Location: [facility] IN[facility]ASIVE [facility];  Service: Cardiovascular;  Laterality: N/A;   ROTATOR CUFF REPAIR Right 2011   WRIST FRACTURE SURGERY Left     Family History  Problem Relation Age of Onset   Breast cancer Paternal Grandmother    CAD Father    Diabetes Mellitus II Father    Heart failure Father    Diabetes Father    Pancreatic cancer Mother    HIV/AIDS Brother    Other Sister        Brain tumor   Colon cancer Neg Hx     Social History Social History   Tobacco Use   Smoking status: Former    Types: Cigarettes    Passive exposure: Never   Smokeless tobacco: Never   Tobacco comments:    "takes a puff sometimes"  Vaping Use   Vaping Use: Never used  Substance Use Topics   Alcohol use: Not Currently    Comment: heavily daily   Drug use: Not Currently    Types: Cocaine, "Crack" cocaine, Marijuana    Comment: last used two years ago    Allergies  Allergen Reactions   Penicillins Anaphylaxis    Has patient had a PCN reaction causing immediate rash, facial/tongue/throat swelling, SOB or lightheadedness with hypotension: Yes Has patient had a PCN reaction causing  severe rash involving mucus membranes or skin necrosis: Yes Has patient had a PCN reaction that required hospitalization Yes Has patient had a PCN reaction occurring within the last 10 years: No-more than 10 years ago If all of the above answers are "NO", then may proceed with Cephalosporin use.    Penicillins Anaphylaxis    DID THE REACTION INVOLVE: Swelling of the face/tongue/throat, SOB, or low BP? Yes Sudden or severe rash/hives, skin peeling, or the inside of the mouth or nose? Yes Did it require medical treatment? No When did it last happen? Within the past 10 years If all above answers are NO, may proceed with cephalosporin use.     Asa [Aspirin] Rash   Aspirin Rash    Current Outpatient Medications  Medication Sig Dispense Refill   albuterol (VENTOLIN  HFA) 108 (90 Base) MCG/ACT inhaler Inhale 2 puffs into the lungs every 4 (four) hours as needed for wheezing or shortness of breath.     ALPRAZolam (XANAX) 0.5 MG tablet Take 0.5 mg by mouth daily as needed.     amLODipine (NORVASC) 5 MG tablet Take 1 tablet (5 mg total) by mouth daily. 30 tablet 6   atorvastatin (LIPITOR) 10 MG tablet Take 10 mg by mouth at bedtime.     buPROPion (WELLBUTRIN SR) 150 MG 12 hr tablet Take 150 mg by mouth daily.     cyclobenzaprine (FLEXERIL) 10 MG tablet Take 10 mg by mouth 3 (three) times daily.     diclofenac (VOLTAREN) 75 MG EC tablet Take 1 tablet (75 mg total) by mouth 2 (two) times daily. 60 tablet 0   estrogen, conjugated,-medroxyprogesterone (PREMPRO) 0.3-1.5 MG tablet Take 1 tablet by mouth daily. 30 tablet 5   haloperidol (HALDOL) 10 MG tablet Take 10 mg by mouth 2 (two) times daily.     HYDROcodone-acetaminophen (NORCO) 10-325 MG tablet Take 1 tablet by mouth every 4 (four) hours as needed.     meloxicam (MOBIC) 7.5 MG tablet Take 7.5 mg by mouth daily.     trazodone (DESYREL) 300 MG tablet Take 300 mg by mouth at bedtime.     venlafaxine XR (EFFEXOR-XR) 37.5 MG 24 hr capsule Take 37.5 mg by mouth daily.     atorvastatin (LIPITOR) 40 MG tablet Take 1 tablet (40 mg total) by mouth daily. (Patient not taking: Reported on 09/29/2021) 30 tablet 6   benztropine (COGENTIN) 0.5 MG tablet Take 1 tablet (0.5 mg total) by mouth 2 (two) times daily as needed for tremors (EPS). 60 tablet 0   DULoxetine (CYMBALTA) 60 MG capsule Take 1 capsule (60 mg total) by mouth daily. 30 capsule 0   fluticasone (FLONASE) 50 MCG/ACT nasal spray Place into both nostrils. (Patient not taking: Reported on 08/06/2021)     gabapentin (NEURONTIN) 100 MG capsule Take 1 capsule (100 mg total) by mouth 3 (three) times daily. 90 capsule 0   haloperidol (HALDOL) 5 MG tablet Take 1 tablet (5 mg total) by mouth 2 (two) times daily. 60 tablet 0   hydrOXYzine (ATARAX/VISTARIL) 50 MG tablet Take 50  mg by mouth 3 (three) times daily as needed.     isosorbide mononitrate (IMDUR) 30 MG 24 hr tablet Take 2.5 tablets (75 mg total) by mouth daily. 75 tablet 0   metoprolol tartrate (LOPRESSOR) 25 MG tablet Take 1 tablet (25 mg total) by mouth 2 (two) times daily. 60 tablet 0   pantoprazole (PROTONIX) 40 MG tablet Take 40 mg by mouth every morning. (Patient not taking: Reported  on 08/06/2021)     No current facility-administered medications for this visit.    Review of Systems Review of Systems  Constitutional: Negative.   Respiratory: Negative.    Cardiovascular: Negative.   Gastrointestinal: Negative.   Endocrine:       VMS   Genitourinary:  Negative for pelvic pain, vaginal bleeding and vaginal discharge.  Psychiatric/Behavioral:  Positive for sleep disturbance (wakes due to VMS).    Blood pressure 112/70, pulse 73, weight 231 lb (104.8 kg).  Physical Exam Physical Exam Constitutional:      Appearance: Normal appearance. She is obese.  Pulmonary:     Effort: Pulmonary effort is normal.  Neurological:     Mental Status: She is alert.  Psychiatric:        Mood and Affect: Mood normal.        Behavior: Behavior normal.    Data Reviewed [place] 2018 negative Pap negative 07/2021  Assessment Menopausal sx Reported PMP bleeding  Plan Orders Placed This Encounter  Procedures   US PELVIC COMPLETE WITH TRANSVAGINAL    Standing Status:   Future    Standing Expiration Date:   09/29/2022    Order Specific Question:   Reason for Exam (SYMPTOM  OR DIAGNOSIS REQUIRED)    Answer:   post menopause bleeding    Order Specific Question:   Preferred imaging location?    Answer:   [facility]   [facility]   Meds ordered this encounter  Medications   estrogen, conjugated,-medroxyprogesterone (PREMPRO) 0.3-1.5 MG tablet    Sig: Take 1 tablet by mouth daily.    Dispense:  30 tablet    Refill:  5  RTC to review sx. If thickened endometrium recommend endometrial biopsy     Scheryl Darter 09/29/2021, 10:05 AM

## 2021-09-29 NOTE — Progress Notes (Signed)
Patient complains of sweating and hot flashes   I asked patient if she been having any random vaginal bleeding and patient stated that has not been having bleeding since early December.

## 2021-09-30 ENCOUNTER — Telehealth: Payer: Self-pay

## 2021-09-30 LAB — FOLLICLE STIMULATING HORMONE: FSH: 27.6 m[IU]/mL

## 2021-09-30 NOTE — Telephone Encounter (Signed)
Patient left voicemail on nurse line to request lab results from visit with [facility] yesterday. Pt also states she would like to cancel appt scheduled for 10/08/21.

## 2021-10-01 NOTE — Telephone Encounter (Signed)
Left message for pt that I am returning her call and I her results are normal.  If she has any questions to please give the office a call back.    Leonette Nutting  10/01/21

## 2021-10-08 ENCOUNTER — Ambulatory Visit (HOSPITAL_COMMUNITY): Payer: Medicare Other

## 2021-10-23 ENCOUNTER — Other Ambulatory Visit: Payer: Self-pay

## 2021-10-23 ENCOUNTER — Ambulatory Visit (INDEPENDENT_AMBULATORY_CARE_PROVIDER_SITE_OTHER): Payer: Medicare Other | Admitting: Sports Medicine

## 2021-10-23 DIAGNOSIS — M79672 Pain in left foot: Secondary | ICD-10-CM

## 2021-10-23 DIAGNOSIS — B070 Plantar wart: Secondary | ICD-10-CM

## 2021-10-23 DIAGNOSIS — M79671 Pain in right foot: Secondary | ICD-10-CM

## 2021-10-23 DIAGNOSIS — G629 Polyneuropathy, unspecified: Secondary | ICD-10-CM

## 2021-10-23 DIAGNOSIS — Q828 Other specified congenital malformations of skin: Secondary | ICD-10-CM

## 2021-10-23 NOTE — Progress Notes (Signed)
?Subjective: ?Amanda [name] is a 56 y.o. female patient who returns to office for follow up to callus skin/wart.  Patient reports that she is having leg knee and chest pain and states that she is going through a lot right now. ? ?Patient Active Problem List  ? Diagnosis Date Noted  ? Bipolar I disorder, most recent episode depressed, severe w psychosis ([place]) 02/25/2021  ? Alcohol abuse 02/25/2021  ? Abnormal weight loss 12/19/2020  ? Change in bowel habit 12/19/2020  ? Colon cancer screening 12/19/2020  ? Diarrhea 12/19/2020  ? Dysphagia 12/19/2020  ? Epigastric pain 12/19/2020  ? Family history of malignant neoplasm of gastrointestinal tract 12/19/2020  ? Non-ST elevation (NSTEMI) myocardial infarction [facility])   ? Contusion of chest 08/26/2018  ? Cardiac arrest ([place]) 08/26/2018  ? Cocaine abuse ([place]) 08/26/2018  ? HLD (hyperlipidemia) 08/26/2018  ? Elevated LFTs 08/26/2018  ? History of migraine   ? Degenerative joint disease 07/26/2017  ? Morbid obesity ([place]) 12/24/2016  ? Hyperlipidemia 12/23/2016  ? Painful orthopaedic hardware ([place]) 03/24/2016  ? Corns and callosities 03/24/2016  ? PUD (peptic ulcer disease) 01/02/2016  ? Onychomycosis 01/02/2016  ? Seasonal allergies 11/19/2015  ? GERD (gastroesophageal reflux disease) 08/06/2015  ? Asthma 08/06/2015  ? Migraine 08/06/2015  ? Bipolar disorder ([place]) 07/25/2015  ? Neuropathy 07/08/2015  ? Major depressive disorder, recurrent, severe without psychotic features ([employer] [facility])   ? Alcohol use disorder, severe, dependence ([place]) 11/16/2014  ? Cocaine use disorder, severe, dependence ([place]) 11/16/2014  ? Acute calculous cholecystitis s/p lap chole 02/09/2014 02/09/2014  ? HTN (hypertension) 07/25/2013  ? Carpal tunnel syndrome 11/18/2012  ? Lumbar radiculopathy 11/18/2012  ? Anxiety state, unspecified 11/18/2012  ? ? ?Current Outpatient Medications on File Prior to Visit  ?Medication Sig Dispense Refill  ? albuterol (VENTOLIN HFA) 108 (90 Base) MCG/ACT inhaler Inhale 2  puffs into the lungs every 4 (four) hours as needed for wheezing or shortness of breath.    ? ALPRAZolam (XANAX) 0.5 MG tablet Take 0.5 mg by mouth daily as needed.    ? amLODipine (NORVASC) 5 MG tablet Take 1 tablet (5 mg total) by mouth daily. 30 tablet 6  ? atorvastatin (LIPITOR) 10 MG tablet Take 10 mg by mouth at bedtime.    ? atorvastatin (LIPITOR) 40 MG tablet Take 1 tablet (40 mg total) by mouth daily. (Patient not taking: Reported on 09/29/2021) 30 tablet 6  ? benztropine (COGENTIN) 0.5 MG tablet Take 1 tablet (0.5 mg total) by mouth 2 (two) times daily as needed for tremors (EPS). 60 tablet 0  ? buPROPion (WELLBUTRIN SR) 150 MG 12 hr tablet Take 150 mg by mouth daily.    ? cyclobenzaprine (FLEXERIL) 10 MG tablet Take 10 mg by mouth 3 (three) times daily.    ? diclofenac (VOLTAREN) 75 MG EC tablet Take 1 tablet (75 mg total) by mouth 2 (two) times daily. 60 tablet 0  ? DULoxetine (CYMBALTA) 60 MG capsule Take 1 capsule (60 mg total) by mouth daily. 30 capsule 0  ? estrogen, conjugated,-medroxyprogesterone (PREMPRO) 0.3-1.5 MG tablet Take 1 tablet by mouth daily. 30 tablet 5  ? fluticasone (FLONASE) 50 MCG/ACT nasal spray Place into both nostrils. (Patient not taking: Reported on 08/06/2021)    ? gabapentin (NEURONTIN) 100 MG capsule Take 1 capsule (100 mg total) by mouth 3 (three) times daily. 90 capsule 0  ? haloperidol (HALDOL) 10 MG tablet Take 10 mg by mouth 2 (two) times daily.    ? haloperidol (HALDOL)  5 MG tablet Take 1 tablet (5 mg total) by mouth 2 (two) times daily. 60 tablet 0  ? HYDROcodone-acetaminophen (NORCO) 10-325 MG tablet Take 1 tablet by mouth every 4 (four) hours as needed.    ? hydrOXYzine (ATARAX/VISTARIL) 50 MG tablet Take 50 mg by mouth 3 (three) times daily as needed.    ? isosorbide mononitrate (IMDUR) 30 MG 24 hr tablet Take 2.5 tablets (75 mg total) by mouth daily. 75 tablet 0  ? meloxicam (MOBIC) 7.5 MG tablet Take 7.5 mg by mouth daily.    ? metoprolol tartrate (LOPRESSOR) 25 MG  tablet Take 1 tablet (25 mg total) by mouth 2 (two) times daily. 60 tablet 0  ? pantoprazole (PROTONIX) 40 MG tablet Take 40 mg by mouth every morning. (Patient not taking: Reported on 08/06/2021)    ? trazodone (DESYREL) 300 MG tablet Take 300 mg by mouth at bedtime.    ? venlafaxine XR (EFFEXOR-XR) 37.5 MG 24 hr capsule Take 37.5 mg by mouth daily.    ? [DISCONTINUED] cetirizine (ZYRTEC) 10 MG tablet Take 1 tablet (10 mg total) by mouth daily. 30 tablet 5  ? [DISCONTINUED] cloNIDine (CATAPRES) 0.1 MG tablet Take 1 tablet (0.1 mg total) by mouth at bedtime as needed. For hot flashes 30 tablet 3  ? [DISCONTINUED] ipratropium (ATROVENT) 0.03 % nasal spray Place 2 sprays into both nostrils 2 (two) times daily.    ? [DISCONTINUED] SUMAtriptan (IMITREX) 50 MG tablet TAKE 1 TABLET BY MOUTH AT THE ONSET OF A MIGRAINE. MAY REPEAT IN 2 HOURS IF HEADACHE PERSISTS OR RECURS. MAXIMUM DAILY DOSE 200 MG 9 tablet 2  ? [DISCONTINUED] SYMBICORT 80-4.5 MCG/ACT inhaler SMARTSIG:2 Puff(s) By Mouth Twice Daily    ? [DISCONTINUED] topiramate (TOPAMAX) 25 MG tablet     ? ?No current facility-administered medications on file prior to visit.  ? ? ?Allergies  ?Allergen Reactions  ? Penicillins Anaphylaxis  ?  Has patient had a PCN reaction causing immediate rash, facial/tongue/throat swelling, SOB or lightheadedness with hypotension: Yes ?Has patient had a PCN reaction causing severe rash involving mucus membranes or skin necrosis: Yes ?Has patient had a PCN reaction that required hospitalization Yes ?Has patient had a PCN reaction occurring within the last 10 years: No-more than 10 years ago ?If all of the above answers are "NO", then may proceed with Cephalosporin use. ?  ? Penicillins Anaphylaxis  ?  DID THE REACTION INVOLVE: Swelling of the face/tongue/throat, SOB, or low BP? Yes ?Sudden or severe rash/hives, skin peeling, or the inside of the mouth or nose? Yes ?Did it require medical treatment? No ?When did it last happen? Within the  past 10 years ?If all above answers are ?NO?, may proceed with cephalosporin use. ? ?  ? Asa [Aspirin] Rash  ? Aspirin Rash  ? ? ?Objective:  ?General: Alert and oriented x3 in no acute distress ? ?Dermatology: Keratotic lesion present plantar foot right greater than left at the cuboid and lateral heel on the right and submet 5 on the left with skin lines transversing the lesion, pain is present with direct pressure to the lesion with a central nucleated core noted, that appears to be improving, no webspace macerations, no ecchymosis bilateral, all nails x 8 are mildly elongated and thickened, there is minimal nails noted to bilateral hallux from previous history of nail removal. ? ?Vascular: Dorsalis Pedis and Posterior Tibial pedal pulses 1/4, Capillary Fill Time 5 seconds, decreased pedal hair growth bilateral, no edema bilateral lower extremities, Temperature  gradient within normal limits. ? ?Neurology: Gross sensation intact via light touch bilateral.   ? ?Musculoskeletal: Mild tenderness with palpation at the keratotic lesion site on Right>Left, Muscular strength 5/5 in all groups without pain or limitation on range of motion.  Pes planus foot deformity noted bilateral. ? ?Assessment and Plan: ?Problem List Items Addressed This Visit   ? ?  ? Nervous and Auditory  ? Neuropathy  ? ?Other Visit Diagnoses   ? ? Plantar wart    -  Primary  ? Porokeratosis      ? Foot pain, bilateral      ? ?  ? ?-Complete examination performed ?-Parred keratoic lesions/? wart using a chisel x3 blade; treated the area with Salinocaine to area covered with Band-Aids and advised patient to leave in place until tomorrow like before ?-Continue with medical work-up for other problems/conditions ?-Patient to return to office 4-6 weeks for follow-up on keratotic lesions or sooner if condition worsens. ? ?Landis Martins, DPM ?

## 2021-11-12 ENCOUNTER — Institutional Professional Consult (permissible substitution): Payer: Medicare Other | Admitting: Pulmonary Disease

## 2021-11-13 ENCOUNTER — Other Ambulatory Visit: Payer: Self-pay

## 2021-11-13 ENCOUNTER — Encounter: Payer: Self-pay | Admitting: Pulmonary Disease

## 2021-11-13 ENCOUNTER — Ambulatory Visit (INDEPENDENT_AMBULATORY_CARE_PROVIDER_SITE_OTHER): Payer: Medicare Other | Admitting: Pulmonary Disease

## 2021-11-13 VITALS — BP 118/82 | HR 79 | Temp 97.9°F | Ht 67.0 in | Wt 237.8 lb

## 2021-11-13 DIAGNOSIS — J4541 Moderate persistent asthma with (acute) exacerbation: Secondary | ICD-10-CM

## 2021-11-13 LAB — CBC WITH DIFFERENTIAL/PLATELET
Basophils Absolute: 0.1 10*3/uL (ref 0.0–0.1)
Basophils Relative: 1.2 % (ref 0.0–3.0)
Eosinophils Absolute: 0.4 10*3/uL (ref 0.0–0.7)
Eosinophils Relative: 5.2 % — ABNORMAL HIGH (ref 0.0–5.0)
HCT: 35.7 % — ABNORMAL LOW (ref 36.0–46.0)
Hemoglobin: 12.1 g/dL (ref 12.0–15.0)
Lymphocytes Relative: 41.8 % (ref 12.0–46.0)
Lymphs Abs: 3 10*3/uL (ref 0.7–4.0)
MCHC: 33.8 g/dL (ref 30.0–36.0)
MCV: 92.7 fl (ref 78.0–100.0)
Monocytes Absolute: 0.8 10*3/uL (ref 0.1–1.0)
Monocytes Relative: 10.7 % (ref 3.0–12.0)
Neutro Abs: 2.9 10*3/uL (ref 1.4–7.7)
Neutrophils Relative %: 41.1 % — ABNORMAL LOW (ref 43.0–77.0)
Platelets: 213 10*3/uL (ref 150.0–400.0)
RBC: 3.85 Mil/uL — ABNORMAL LOW (ref 3.87–5.11)
RDW: 12.5 % (ref 11.5–15.5)
WBC: 7.1 10*3/uL (ref 4.0–10.5)

## 2021-11-13 MED ORDER — ADVAIR HFA 230-21 MCG/ACT IN AERO: 2.0000 | INHALATION_SPRAY | Freq: Two times a day (BID) | RESPIRATORY_TRACT | 12 refills | Status: DC

## 2021-11-13 MED ORDER — PREDNISONE 10 MG PO TABS: 40.0000 mg | ORAL_TABLET | Freq: Every day | ORAL | 0 refills | Status: AC

## 2021-11-13 NOTE — Progress Notes (Signed)
? ? ?Subjective:  ? ?PATIENT ID: Amanda [name] GENDER: female DOB: [birth year], MRN: [id number] ? ? ?HPI ? ?Chief Complaint  ?Patient presents with  ? Consult  ? ? ?Reason for Visit: New consult for asthma ? ?Ms. Jillion Carrie is a 56 year old female former smoker with asthma/COPD, hypertension, DM2, GERD/PUD history, hx cardiac arrest and chronic back pain who presents for asthma. Referred by PA Georjean Mode from [place]. ? ?She reports since November 2022 with persistent shortness of breath with minimal exertion. Has chronic cough for months at this point. Associated wheezing. She was seen by [facility] and had spirometry completed. She uses albuterol HFA three times a day. Uses nebulizer four times a day. Has nighttime symptoms. Previously tried Trelegy with partial improvement. Denies ever having COVID ? ?PFT 09/02/21 FEV1 54%. No ratio listed ?Myocardial scan 09/18/21 - Abnormal (Moderate inferolateral and inferior perfusion defect) with low risk of short term to intermediate term CV events. Normal symptomatic and EKG response to stress perfusion. ? ?Planning for carpal tunnel in the future. ? 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 ?I have personally reviewed patient's past medical/family/social history, allergies, current medications. ? ?Past Medical History:  ?Diagnosis Date  ? Anxiety   ? Arthritis   ? lower back  ? Asthma   ? Bipolar disorder ([place])   ? Carpal tunnel syndrome, bilateral   ? Chronic back pain   ? Depression   ? Diverticulosis   ? Fx. left wrist   ? GERD (gastroesophageal reflux disease)   ? Heart murmur   ? "born with"  ? Heart murmur   ? HLD (hyperlipidemia)   ? Hypertension   ? IBS (irritable bowel syndrome)   ? Internal hemorrhoids   ? Muscle spasms of neck   ? Neuropathy   ? PUD (peptic ulcer disease)   ?  ? ?Family History  ?Problem Relation Age of Onset  ? Breast cancer Paternal Grandmother   ? CAD Father   ? Diabetes Mellitus II Father   ? Heart failure  Father   ? Diabetes Father   ? Pancreatic cancer Mother   ? HIV/AIDS Brother   ? Other Sister   ?     Brain tumor  ? Colon cancer Neg Hx   ?  ? ?Social History  ? ?Occupational History  ? Occupation: [employer]  ?  Comment: [employer]  ?Tobacco Use  ? Smoking status: Former  ?  Types: Cigarettes  ?  Passive exposure: Never  ? Smokeless tobacco: Never  ? Tobacco comments:  ?  "takes a puff sometimes"  ?Vaping Use  ? Vaping Use: Never used  ?Substance and Sexual Activity  ? Alcohol use: Not Currently  ?  Comment: heavily daily  ? Drug use: Not Currently  ?  Types: Cocaine, "Crack" cocaine, Marijuana  ?  Comment: last used two years ago  ? Sexual activity: Not Currently  ?  Partners: Female  ? ? ?Allergies  ?Allergen Reactions  ? Penicillins Anaphylaxis  ?  Has patient had a PCN reaction causing immediate rash, facial/tongue/throat swelling, SOB or lightheadedness with hypotension: Yes ?Has patient had a PCN reaction causing severe rash involving mucus membranes or skin necrosis: Yes ?Has patient had a PCN reaction that required hospitalization Yes ?Has patient had a PCN reaction occurring within the last 10 years: No-more than 10 years ago ?If all of the above answers are "NO", then may proceed with Cephalosporin use. ?  ? Penicillins  Anaphylaxis  ?  DID THE REACTION INVOLVE: Swelling of the face/tongue/throat, SOB, or low BP? Yes ?Sudden or severe rash/hives, skin peeling, or the inside of the mouth or nose? Yes ?Did it require medical treatment? No ?When did it last happen? Within the past 10 years ?If all above answers are ?NO?, may proceed with cephalosporin use. ? ?  ? Asa [Aspirin] Rash  ? Aspirin Rash  ?  ? ?Outpatient Medications Prior to Visit  ?Medication Sig Dispense Refill  ? albuterol (VENTOLIN HFA) 108 (90 Base) MCG/ACT inhaler Inhale 2 puffs into the lungs every 4 (four) hours as needed for wheezing or shortness of breath.    ? ALPRAZolam (XANAX) 0.5 MG tablet Take 0.5 mg by mouth daily as needed.    ? amLODipine  (NORVASC) 5 MG tablet Take 1 tablet (5 mg total) by mouth daily. 30 tablet 6  ? atorvastatin (LIPITOR) 10 MG tablet Take 10 mg by mouth at bedtime.    ? atorvastatin (LIPITOR) 40 MG tablet Take 1 tablet (40 mg total) by mouth daily. 30 tablet 6  ? buPROPion (WELLBUTRIN SR) 150 MG 12 hr tablet Take 150 mg by mouth daily.    ? cyclobenzaprine (FLEXERIL) 10 MG tablet Take 10 mg by mouth 3 (three) times daily.    ? diclofenac (VOLTAREN) 75 MG EC tablet Take 1 tablet (75 mg total) by mouth 2 (two) times daily. 60 tablet 0  ? estrogen, conjugated,-medroxyprogesterone (PREMPRO) 0.3-1.5 MG tablet Take 1 tablet by mouth daily. 30 tablet 5  ? haloperidol (HALDOL) 10 MG tablet Take 10 mg by mouth 2 (two) times daily.    ? HYDROcodone-acetaminophen (NORCO) 10-325 MG tablet Take 1 tablet by mouth every 4 (four) hours as needed.    ? hydrOXYzine (ATARAX/VISTARIL) 50 MG tablet Take 50 mg by mouth 3 (three) times daily as needed.    ? meloxicam (MOBIC) 7.5 MG tablet Take 7.5 mg by mouth daily.    ? trazodone (DESYREL) 300 MG tablet Take 300 mg by mouth at bedtime.    ? venlafaxine XR (EFFEXOR-XR) 37.5 MG 24 hr capsule Take 37.5 mg by mouth daily.    ? benztropine (COGENTIN) 0.5 MG tablet Take 1 tablet (0.5 mg total) by mouth 2 (two) times daily as needed for tremors (EPS). 60 tablet 0  ? DULoxetine (CYMBALTA) 60 MG capsule Take 1 capsule (60 mg total) by mouth daily. 30 capsule 0  ? fluticasone (FLONASE) 50 MCG/ACT nasal spray Place into both nostrils. (Patient not taking: Reported on 08/06/2021)    ? gabapentin (NEURONTIN) 100 MG capsule Take 1 capsule (100 mg total) by mouth 3 (three) times daily. 90 capsule 0  ? haloperidol (HALDOL) 5 MG tablet Take 1 tablet (5 mg total) by mouth 2 (two) times daily. 60 tablet 0  ? isosorbide mononitrate (IMDUR) 30 MG 24 hr tablet Take 2.5 tablets (75 mg total) by mouth daily. 75 tablet 0  ? metoprolol tartrate (LOPRESSOR) 25 MG tablet Take 1 tablet (25 mg total) by mouth 2 (two) times daily. 60  tablet 0  ? pantoprazole (PROTONIX) 40 MG tablet Take 40 mg by mouth every morning. (Patient not taking: Reported on 08/06/2021)    ? ?No facility-administered medications prior to visit.  ? ? ?Review of Systems  ?Constitutional:  Negative for chills, diaphoresis, fever, malaise/fatigue and weight loss.  ?HENT:  Negative for congestion.   ?Respiratory:  Positive for cough, shortness of breath and wheezing. Negative for hemoptysis and sputum production.   ?Cardiovascular:  Negative for chest pain, palpitations and leg swelling.  ? ? ?Objective:  ? ?Vitals:  ? 11/13/21 1142  ?BP: 118/82  ?Pulse: 79  ?Temp: 97.9 ?F (36.6 ?C)  ?TempSrc: Oral  ?SpO2: 100%  ?Weight: 237 lb 12.8 oz (107.9 kg)  ?Height: 5\' 7"  (1.702 m)  ? ?SpO2: 100 % ?O2 Device: None (Room air) ? ?Physical Exam: ?General: Well-appearing, no acute distress ?HENT: [place], AT ?Eyes: EOMI, no scleral icterus ?Respiratory: Clear to auscultation bilaterally.  No crackles, wheezing or rales ?Cardiovascular: RRR, -M/R/G, no JVD ?Extremities:-Edema,-tenderness ?Neuro: AAO x4, CNII-XII grossly intact ?Psych: Normal mood, normal affect ? ?Data Reviewed: ? ?Imaging: ?CTA 08/25/2018-no pulmonary embolism bilateral groundglass opacities.  No parenchymal abnormalities ? ?PFT: ?None on file ? ?Labs: ?CBC ?   ?Component Value Date/Time  ? WBC 4.2 03/03/2021 1307  ? RBC 3.80 (L) 03/03/2021 1307  ? HGB 12.8 03/03/2021 1307  ? HGB 11.5 09/13/2018 1528  ? HCT 39.1 03/03/2021 1307  ? HCT 32.9 (L) 09/13/2018 1528  ? PLT 167 03/03/2021 1307  ? PLT 244 09/13/2018 1528  ? MCV 102.9 (H) 03/03/2021 1307  ? MCV 98 (H) 09/13/2018 1528  ? MCH 33.7 03/03/2021 1307  ? MCHC 32.7 03/03/2021 1307  ? RDW 12.9 03/03/2021 1307  ? RDW 12.8 09/13/2018 1528  ? LYMPHSABS 1.8 03/03/2021 1307  ? LYMPHSABS 2.2 09/13/2018 1528  ? MONOABS 0.5 03/03/2021 1307  ? EOSABS 0.2 03/03/2021 1307  ? EOSABS 0.2 09/13/2018 1528  ? BASOSABS 0.0 03/03/2021 1307  ? BASOSABS 0.1 09/13/2018 1528  ? ?Absolute eos 09/13/18 -  200 ? ?  ?Assessment & Plan:  ? ?Discussion: ?56 year old female former smoker with asthma/COPD, hypertension, DM2, GERD/PUD history, hx cardiac arrest and chronic back pain who presents for asthma. [place]

## 2021-11-13 NOTE — Patient Instructions (Addendum)
?  Uncontrolled asthma ?--Prednisone 40 mg x 5 days ?--START Advair 230-21 mcg TWO puff TWICE a day ?--CONTINUE Albuterol TWO puffs AS NEEDED for shortness of breath ?--ARRANGE for pulmonary function tests ?--ORDER labs: CBC with diff and IgE ? ?Schedule two appointments: ?Follow-up with me in 2 weeks ?Follow-up with me in May with PFTs prior to visit ?

## 2021-11-14 LAB — IGE: IgE (Immunoglobulin E), Serum: 8 kU/L (ref <=114)

## 2021-11-19 ENCOUNTER — Telehealth: Payer: Self-pay | Admitting: *Deleted

## 2021-11-19 NOTE — Telephone Encounter (Signed)
LM for pt to call back to schedule Pre-Op appt.  ?

## 2021-11-19 NOTE — Telephone Encounter (Signed)
? ?  Name: Amanda [name]  ?DOB: Aug 04, 1966  ?MRN: [id number] ? ?Primary Cardiologist: Nicki Guadalajara, MD ? ?Chart reviewed as part of pre-operative protocol coverage. Because of Amanda [name]'s past medical history and time since last visit, she will require a follow-up visit in order to better assess preoperative cardiovascular risk. ? ?Pre-op covering staff: ?- Please schedule appointment and call patient to inform them. If patient already had an upcoming appointment within acceptable timeframe, please add "pre-op clearance" to the appointment notes so provider is aware. ?- Please contact requesting surgeon's office via preferred method (i.e, phone, fax) to inform them of need for appointment prior to surgery. ? ?If applicable, this message will also be routed to pharmacy pool and/or primary cardiologist for input on holding anticoagulant/antiplatelet agent as requested below so that this information is available to the clearing provider at time of patient's appointment.  ? ?Azalee Course, [place]  ?11/19/2021, 4:28 PM  ? ?

## 2021-11-19 NOTE — Telephone Encounter (Signed)
? ?  Pre-operative Risk Assessment  ?  ?Patient Name: Amanda [name]  ?DOB: [birth year] ?MRN: [id number]  ? ?  ? ?Request for Surgical Clearance   ? ?Procedure:  left open carpal tunnel release ? ?Date of Surgery:  Clearance TBD                              ?   ?Surgeon:  not listed ?Surgeon's Group or Practice Name:  [facility] ?Phone number:  [phone] ?Fax number:  [phone] ?  ?Type of Clearance Requested:   ?- Medical  ?  ?Type of Anesthesia:  IV regional ?  ?Additional requests/questions:   ? ?Signed, ?Deliah Goody   ?11/19/2021, 4:10 PM   ?

## 2021-11-20 ENCOUNTER — Telehealth: Payer: Self-pay | Admitting: Pulmonary Disease

## 2021-11-20 ENCOUNTER — Ambulatory Visit (INDEPENDENT_AMBULATORY_CARE_PROVIDER_SITE_OTHER): Payer: Medicare Other | Admitting: Sports Medicine

## 2021-11-20 DIAGNOSIS — G629 Polyneuropathy, unspecified: Secondary | ICD-10-CM

## 2021-11-20 DIAGNOSIS — M79671 Pain in right foot: Secondary | ICD-10-CM

## 2021-11-20 DIAGNOSIS — Q828 Other specified congenital malformations of skin: Secondary | ICD-10-CM

## 2021-11-20 DIAGNOSIS — B070 Plantar wart: Secondary | ICD-10-CM | POA: Diagnosis not present

## 2021-11-20 DIAGNOSIS — M79672 Pain in left foot: Secondary | ICD-10-CM

## 2021-11-20 NOTE — Progress Notes (Signed)
?Subjective: ?Amanda [name] is a 56 y.o. female patient who returns to office for follow up to callus skin/wart plantar surfaces of both feet.  Patient reports that she is doing better but is still having pulmonary issues.  Patient denies any other pedal complaints at this time. ? ?Patient Active Problem List  ? Diagnosis Date Noted  ? Bipolar I disorder, most recent episode depressed, severe w psychosis (HCC) 02/25/2021  ? Alcohol abuse 02/25/2021  ? Abnormal weight loss 12/19/2020  ? Change in bowel habit 12/19/2020  ? Colon cancer screening 12/19/2020  ? Diarrhea 12/19/2020  ? Dysphagia 12/19/2020  ? Epigastric pain 12/19/2020  ? Family history of malignant neoplasm of gastrointestinal tract 12/19/2020  ? Non-ST elevation (NSTEMI) myocardial infarction [facility](HCC)   ? Contusion of chest 08/26/2018  ? Cardiac arrest (HCC) 08/26/2018  ? Cocaine abuse (HCC) 08/26/2018  ? HLD (hyperlipidemia) 08/26/2018  ? Elevated LFTs 08/26/2018  ? History of migraine   ? Degenerative joint disease 07/26/2017  ? Morbid obesity (HCC) 12/24/2016  ? Hyperlipidemia 12/23/2016  ? Painful orthopaedic hardware (HCC) 03/24/2016  ? Corns and callosities 03/24/2016  ? PUD (peptic ulcer disease) 01/02/2016  ? Onychomycosis 01/02/2016  ? Seasonal allergies 11/19/2015  ? GERD (gastroesophageal reflux disease) 08/06/2015  ? Asthma 08/06/2015  ? Migraine 08/06/2015  ? Bipolar disorder (HCC) 07/25/2015  ? Neuropathy 07/08/2015  ? Major depressive disorder, recurrent, severe without psychotic features (HCC)   ? Alcohol use disorder, severe, dependence (HCC) 11/16/2014  ? Cocaine use disorder, severe, dependence (HCC) 11/16/2014  ? Acute calculous cholecystitis s/p lap chole 02/09/2014 02/09/2014  ? HTN (hypertension) 07/25/2013  ? Carpal tunnel syndrome 11/18/2012  ? Lumbar radiculopathy 11/18/2012  ? Anxiety state, unspecified 11/18/2012  ? ? ?Current Outpatient Medications on File Prior to Visit  ?Medication Sig Dispense Refill  ? albuterol  (VENTOLIN HFA) 108 (90 Base) MCG/ACT inhaler Inhale 2 puffs into the lungs every 4 (four) hours as needed for wheezing or shortness of breath.    ? ALPRAZolam (XANAX) 0.5 MG tablet Take 0.5 mg by mouth daily as needed.    ? amLODipine (NORVASC) 5 MG tablet Take 1 tablet (5 mg total) by mouth daily. 30 tablet 6  ? atorvastatin (LIPITOR) 10 MG tablet Take 10 mg by mouth at bedtime.    ? atorvastatin (LIPITOR) 40 MG tablet Take 1 tablet (40 mg total) by mouth daily. 30 tablet 6  ? benztropine (COGENTIN) 0.5 MG tablet Take 1 tablet (0.5 mg total) by mouth 2 (two) times daily as needed for tremors (EPS). 60 tablet 0  ? buPROPion (WELLBUTRIN SR) 150 MG 12 hr tablet Take 150 mg by mouth daily.    ? cyclobenzaprine (FLEXERIL) 10 MG tablet Take 10 mg by mouth 3 (three) times daily.    ? diclofenac (VOLTAREN) 75 MG EC tablet Take 1 tablet (75 mg total) by mouth 2 (two) times daily. 60 tablet 0  ? DULoxetine (CYMBALTA) 60 MG capsule Take 1 capsule (60 mg total) by mouth daily. 30 capsule 0  ? estrogen, conjugated,-medroxyprogesterone (PREMPRO) 0.3-1.5 MG tablet Take 1 tablet by mouth daily. 30 tablet 5  ? fluticasone (FLONASE) 50 MCG/ACT nasal spray Place into both nostrils. (Patient not taking: Reported on 08/06/2021)    ? fluticasone-salmeterol (ADVAIR HFA) 230-21 MCG/ACT inhaler Inhale 2 puffs into the lungs 2 (two) times daily. 1 each 12  ? gabapentin (NEURONTIN) 100 MG capsule Take 1 capsule (100 mg total) by mouth 3 (three) times daily. 90 capsule 0  ?  haloperidol (HALDOL) 10 MG tablet Take 10 mg by mouth 2 (two) times daily.    ? haloperidol (HALDOL) 5 MG tablet Take 1 tablet (5 mg total) by mouth 2 (two) times daily. 60 tablet 0  ? HYDROcodone-acetaminophen (NORCO) 10-325 MG tablet Take 1 tablet by mouth every 4 (four) hours as needed.    ? hydrOXYzine (ATARAX/VISTARIL) 50 MG tablet Take 50 mg by mouth 3 (three) times daily as needed.    ? isosorbide mononitrate (IMDUR) 30 MG 24 hr tablet Take 2.5 tablets (75 mg total)  by mouth daily. 75 tablet 0  ? meloxicam (MOBIC) 7.5 MG tablet Take 7.5 mg by mouth daily.    ? metoprolol tartrate (LOPRESSOR) 25 MG tablet Take 1 tablet (25 mg total) by mouth 2 (two) times daily. 60 tablet 0  ? pantoprazole (PROTONIX) 40 MG tablet Take 40 mg by mouth every morning. (Patient not taking: Reported on 08/06/2021)    ? trazodone (DESYREL) 300 MG tablet Take 300 mg by mouth at bedtime.    ? venlafaxine XR (EFFEXOR-XR) 37.5 MG 24 hr capsule Take 37.5 mg by mouth daily.    ? [DISCONTINUED] cetirizine (ZYRTEC) 10 MG tablet Take 1 tablet (10 mg total) by mouth daily. 30 tablet 5  ? [DISCONTINUED] cloNIDine (CATAPRES) 0.1 MG tablet Take 1 tablet (0.1 mg total) by mouth at bedtime as needed. For hot flashes 30 tablet 3  ? [DISCONTINUED] ipratropium (ATROVENT) 0.03 % nasal spray Place 2 sprays into both nostrils 2 (two) times daily.    ? [DISCONTINUED] SUMAtriptan (IMITREX) 50 MG tablet TAKE 1 TABLET BY MOUTH AT THE ONSET OF A MIGRAINE. MAY REPEAT IN 2 HOURS IF HEADACHE PERSISTS OR RECURS. MAXIMUM DAILY DOSE 200 MG 9 tablet 2  ? [DISCONTINUED] SYMBICORT 80-4.5 MCG/ACT inhaler SMARTSIG:2 Puff(s) By Mouth Twice Daily    ? [DISCONTINUED] topiramate (TOPAMAX) 25 MG tablet     ? ?No current facility-administered medications on file prior to visit.  ? ? ?Allergies  ?Allergen Reactions  ? Penicillins Anaphylaxis  ?  Has patient had a PCN reaction causing immediate rash, facial/tongue/throat swelling, SOB or lightheadedness with hypotension: Yes ?Has patient had a PCN reaction causing severe rash involving mucus membranes or skin necrosis: Yes ?Has patient had a PCN reaction that required hospitalization Yes ?Has patient had a PCN reaction occurring within the last 10 years: No-more than 10 years ago ?If all of the above answers are "NO", then may proceed with Cephalosporin use. ?  ? Penicillins Anaphylaxis  ?  DID THE REACTION INVOLVE: Swelling of the face/tongue/throat, SOB, or low BP? Yes ?Sudden or severe  rash/hives, skin peeling, or the inside of the mouth or nose? Yes ?Did it require medical treatment? No ?When did it last happen? Within the past 10 years ?If all above answers are ?NO?, may proceed with cephalosporin use. ? ?  ? Asa [Aspirin] Rash  ? Aspirin Rash  ? ? ?Objective:  ?General: Alert and oriented x3 in no acute distress ? ?Dermatology: Keratotic lesion present plantar foot right greater than left at the cuboid and lateral heel on the right and submet 5 on the left with skin lines transversing the lesion, pain is present with direct pressure to the lesion with a central nucleated core noted, that appears to be improving, no webspace macerations, no ecchymosis bilateral, all nails x 8 are mildly elongated and thickened, there is minimal nails noted to bilateral hallux from previous history of nail removal. ? ?Vascular: Dorsalis Pedis and Posterior  Tibial pedal pulses 1/4, Capillary Fill Time 5 seconds, decreased pedal hair growth bilateral, no edema bilateral lower extremities, Temperature gradient within normal limits. ? ?Neurology: Gross sensation intact via light touch bilateral.   ? ?Musculoskeletal: Mild tenderness with palpation at the keratotic lesion site on Right>Left, Muscular strength 5/5 in all groups without pain or limitation on range of motion.  Pes planus foot deformity noted bilateral. ? ?Assessment and Plan: ?Problem List Items Addressed This Visit   ? ?  ? Nervous and Auditory  ? Neuropathy  ? ?Other Visit Diagnoses   ? ? Plantar wart    -  Primary  ? Porokeratosis      ? Foot pain, bilateral      ? ?  ? ?-Complete examination performed ?-Parred painful keratoic lesions/? wart using a chisel x3 blade; treated the area with Salinocaine to area covered with Band-Aids and advised patient to leave in place until tomorrow like before ?-At no additional charge mechanically debrided all painful nails using a sterile nail nipper without incident. ?-Patient to return to office 4-6 weeks for  follow-up on keratotic lesions or sooner if condition worsens. ? ?Asencion Islam, DPM ?

## 2021-11-20 NOTE — Telephone Encounter (Signed)
I will update the pre op provider as well as the requesting office. Will wait for the pt to call back. Depending how long it is, we may need to have a new clearance sent over. Though we will wait and address when the pt calls  back.  ?

## 2021-11-20 NOTE — Telephone Encounter (Signed)
Fax received from Dr. Doroteo Glassman with [facility] to perform a Left Open carpal tunnel release on patient.  Patient needs surgery clearance. Patient was seen on 11/13/2021. Office protocol is a risk assessment can be sent to surgeon if patient has been seen in 60 days or less.  ? ?Sending to Dr. Everardo All for risk assessment or recommendations if patient needs to be seen in office prior to surgical procedure.   ?

## 2021-11-20 NOTE — Telephone Encounter (Signed)
Patient states she was not cleared for her surgery by Pulmonology so she will be holding off on the surgery. She says she will call back when her lungs are healed. ?

## 2021-11-20 NOTE — Telephone Encounter (Signed)
On her last visit her asthma was uncontrolled.  ? ?She is scheduled to see me on 11/27/21. Pre-op evaluation will be performed at that date. ?

## 2021-11-27 ENCOUNTER — Ambulatory Visit (INDEPENDENT_AMBULATORY_CARE_PROVIDER_SITE_OTHER): Payer: Medicare Other | Admitting: Pulmonary Disease

## 2021-11-27 ENCOUNTER — Encounter: Payer: Self-pay | Admitting: Pulmonary Disease

## 2021-11-27 VITALS — BP 128/82 | HR 85 | Ht 67.0 in | Wt 235.0 lb

## 2021-11-27 DIAGNOSIS — J4551 Severe persistent asthma with (acute) exacerbation: Secondary | ICD-10-CM | POA: Diagnosis not present

## 2021-11-27 MED ORDER — PREDNISONE 10 MG PO TABS: ORAL_TABLET | ORAL | 0 refills | Status: AC

## 2021-11-27 NOTE — Progress Notes (Signed)
? ? ?Subjective:  ? ?PATIENT ID: Amanda [name] GENDER: female DOB: 11/17/1965, MRN: [id number] ? ? ?HPI ? ?Chief Complaint  ?Patient presents with  ? Follow-up  ?  Asthma   ? ? ?Reason for Visit: Follow-up for asthma ? ?[name] is a 56 year old female former smoker with asthma/COPD, hypertension, DM2, GERD/PUD history, hx cardiac arrest and chronic back pain who presents for asthma. Referred by PA Alphonzo Lemmings from [place] [address] ? ?Synopsis: ?She reports since November 2022 with persistent shortness of breath with minimal exertion. Has chronic cough for months at this point. Associated wheezing. She was seen by [facility] and had spirometry completed. She uses albuterol HFA three times a day. Uses nebulizer four times a day. Has nighttime symptoms. Previously tried Trelegy with partial improvement. Denies ever having COVID ? ?PFT 09/02/21 FEV1 54%. No ratio listed ?Myocardial scan 09/18/21 - Abnormal (Moderate inferolateral and inferior perfusion defect) with low risk of short term to intermediate term CV events. Normal symptomatic and EKG response to stress perfusion. ? ?11/27/21 ?Since our last visit she was treated for asthma exacerbation and started on Advair HFA 230 strength. Inhaler has partially improved however activity including walking up stairs is tiring and will be shortness of breath, wheezing and coughing. She will use her rescue inhaler twice a day. She has postponed her left open carpal tunnel release due to symptoms. She feels short of breath with short distances.  ? ?2023 Jan Feb Mar April May June July Aug Sept Oct Nov Dec  ?   X X          ?2024 Jan Feb Mar April May June July Aug Sept Oct Nov Dec  ?              ?2025 Jan Feb Mar April May June July Aug Sept Oct Nov Dec  ?              ? ? ? ?   ? View : No data to display.  ?  ?  ?  ? ? 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 ?Past Medical History:  ?Diagnosis Date  ? Anxiety   ? Arthritis   ? lower back  ?  Asthma   ? Bipolar disorder (HCC)   ? Carpal tunnel syndrome, bilateral   ? Chronic back pain   ? Depression   ? Diverticulosis   ? Fx. left wrist   ? GERD (gastroesophageal reflux disease)   ? Heart murmur   ? "born with"  ? Heart murmur   ? HLD (hyperlipidemia)   ? Hypertension   ? IBS (irritable bowel syndrome)   ? Internal hemorrhoids   ? Muscle spasms of neck   ? Neuropathy   ? PUD (peptic ulcer disease)   ?  ? ?Family History  ?Problem Relation Age of Onset  ? Breast cancer Paternal Grandmother   ? CAD Father   ? Diabetes Mellitus II Father   ? Heart failure Father   ? Diabetes Father   ? Pancreatic cancer Mother   ? HIV/AIDS Brother   ? Other Sister   ?     Brain tumor  ? Colon cancer Neg Hx   ?  ? ?Social History  ? ?Occupational History  ? Occupation: [employer]  ?  Comment: [employer]  ?Tobacco Use  ? Smoking status: Former  ?  Types: Cigarettes  ?  Passive exposure: Never  ? Smokeless tobacco: Never  ?  Tobacco comments:  ?  "takes a puff sometimes"  ?Vaping Use  ? Vaping Use: Never used  ?Substance and Sexual Activity  ? Alcohol use: Not Currently  ?  Comment: heavily daily  ? Drug use: Not Currently  ?  Types: Cocaine, "Crack" cocaine, Marijuana  ?  Comment: last used two years ago  ? Sexual activity: Not Currently  ?  Partners: Female  ? ? ?Allergies  ?Allergen Reactions  ? Penicillins Anaphylaxis  ?  Has patient had a PCN reaction causing immediate rash, facial/tongue/throat swelling, SOB or lightheadedness with hypotension: Yes ?Has patient had a PCN reaction causing severe rash involving mucus membranes or skin necrosis: Yes ?Has patient had a PCN reaction that required hospitalization Yes ?Has patient had a PCN reaction occurring within the last 10 years: No-more than 10 years ago ?If all of the above answers are "NO", then may proceed with Cephalosporin use. ?  ? Penicillins Anaphylaxis  ?  DID THE REACTION INVOLVE: Swelling of the face/tongue/throat, SOB, or low BP? Yes ?Sudden or severe rash/hives, skin  peeling, or the inside of the mouth or nose? Yes ?Did it require medical treatment? No ?When did it last happen? Within the past 10 years ?If all above answers are ?NO?, may proceed with cephalosporin use. ? ?  ? Asa [Aspirin] Rash  ? Aspirin Rash  ?  ? ?Outpatient Medications Prior to Visit  ?Medication Sig Dispense Refill  ? albuterol (VENTOLIN HFA) 108 (90 Base) MCG/ACT inhaler Inhale 2 puffs into the lungs every 4 (four) hours as needed for wheezing or shortness of breath.    ? ALPRAZolam (XANAX) 0.5 MG tablet Take 0.5 mg by mouth daily as needed.    ? amLODipine (NORVASC) 5 MG tablet Take 1 tablet (5 mg total) by mouth daily. 30 tablet 6  ? atorvastatin (LIPITOR) 10 MG tablet Take 10 mg by mouth at bedtime.    ? buPROPion (WELLBUTRIN SR) 150 MG 12 hr tablet Take 150 mg by mouth daily.    ? estrogen, conjugated,-medroxyprogesterone (PREMPRO) 0.3-1.5 MG tablet Take 1 tablet by mouth daily. 30 tablet 5  ? fluticasone-salmeterol (ADVAIR HFA) 230-21 MCG/ACT inhaler Inhale 2 puffs into the lungs 2 (two) times daily. 1 each 12  ? haloperidol (HALDOL) 10 MG tablet Take 10 mg by mouth 2 (two) times daily.    ? HYDROcodone-acetaminophen (NORCO) 10-325 MG tablet Take 1 tablet by mouth every 4 (four) hours as needed.    ? hydrOXYzine (ATARAX/VISTARIL) 50 MG tablet Take 50 mg by mouth 3 (three) times daily as needed.    ? meloxicam (MOBIC) 7.5 MG tablet Take 7.5 mg by mouth daily.    ? trazodone (DESYREL) 300 MG tablet Take 300 mg by mouth at bedtime.    ? atorvastatin (LIPITOR) 40 MG tablet Take 1 tablet (40 mg total) by mouth daily. (Patient not taking: Reported on 11/27/2021) 30 tablet 6  ? benztropine (COGENTIN) 0.5 MG tablet Take 1 tablet (0.5 mg total) by mouth 2 (two) times daily as needed for tremors (EPS). 60 tablet 0  ? cyclobenzaprine (FLEXERIL) 10 MG tablet Take 10 mg by mouth 3 (three) times daily. (Patient not taking: Reported on 11/27/2021)    ? diclofenac (VOLTAREN) 75 MG EC tablet Take 1 tablet (75 mg total) by  mouth 2 (two) times daily. (Patient not taking: Reported on 11/27/2021) 60 tablet 0  ? DULoxetine (CYMBALTA) 60 MG capsule Take 1 capsule (60 mg total) by mouth daily. 30 capsule 0  ?  fluticasone (FLONASE) 50 MCG/ACT nasal spray Place into both nostrils. (Patient not taking: Reported on 08/06/2021)    ? gabapentin (NEURONTIN) 100 MG capsule Take 1 capsule (100 mg total) by mouth 3 (three) times daily. 90 capsule 0  ? haloperidol (HALDOL) 5 MG tablet Take 1 tablet (5 mg total) by mouth 2 (two) times daily. 60 tablet 0  ? isosorbide mononitrate (IMDUR) 30 MG 24 hr tablet Take 2.5 tablets (75 mg total) by mouth daily. 75 tablet 0  ? metoprolol tartrate (LOPRESSOR) 25 MG tablet Take 1 tablet (25 mg total) by mouth 2 (two) times daily. 60 tablet 0  ? pantoprazole (PROTONIX) 40 MG tablet Take 40 mg by mouth every morning. (Patient not taking: Reported on 08/06/2021)    ? venlafaxine XR (EFFEXOR-XR) 37.5 MG 24 hr capsule Take 37.5 mg by mouth daily. (Patient not taking: Reported on 11/27/2021)    ? ?No facility-administered medications prior to visit.  ? ? ?Review of Systems  ?Constitutional:  Negative for chills, diaphoresis, fever, malaise/fatigue and weight loss.  ?HENT:  Negative for congestion.   ?Respiratory:  Positive for cough, shortness of breath and wheezing. Negative for hemoptysis and sputum production.   ?Cardiovascular:  Negative for chest pain, palpitations and leg swelling.  ? ? ?Objective:  ? ?Vitals:  ? 11/27/21 0947  ?BP: 128/82  ?Pulse: 85  ?SpO2: 97%  ?Weight: 235 lb (106.6 kg)  ?Height: 5\' 7"  (1.702 m)  ?SpO2: 97 % ?O2 Device: None (Room air) ? ?Physical Exam: ?General: Well-appearing, no acute distress ?HENT: [place], AT ?Eyes: EOMI, no scleral icterus ?Respiratory: Clear to auscultation bilaterally.  No crackles, wheezing or rales ?Cardiovascular: RRR, -M/R/G, no JVD ?Extremities:-Edema,-tenderness ?Neuro: AAO x4, CNII-XII grossly intact ?Psych: Normal mood, normal affect ? ?Data Reviewed: ? ?Imaging: ?CTA  08/25/2018-no pulmonary embolism bilateral groundglass opacities.  No parenchymal abnormalities ? ?PFT: ?None on file ? ?Labs: ?CBC ?   ?Component Value Date/Time  ? WBC 7.1 11/13/2021 1213  ? RBC 3.85 (L) 03/

## 2021-11-27 NOTE — Patient Instructions (Addendum)
?  Uncontrolled severe persistent asthma - persistent symptoms, active exacerbation ?Asthma-COPD overlap ?Deconditioning ?--Take prednisone taper as directed ?--CONTINUE Advair 230-21 mcg TWO puff TWICE a day ?--CONTINUE Albuterol TWO puffs AS NEEDED for shortness of breath ?--Scheduled for pulmonary function tests in May ?--Enroll for Nucala ? ?Follow-up with me at next appointment in May ?

## 2021-11-28 ENCOUNTER — Other Ambulatory Visit (HOSPITAL_COMMUNITY): Payer: Self-pay

## 2021-11-28 ENCOUNTER — Telehealth: Payer: Self-pay | Admitting: Pharmacist

## 2021-11-28 DIAGNOSIS — J4551 Severe persistent asthma with (acute) exacerbation: Secondary | ICD-10-CM

## 2021-11-28 MED ORDER — NUCALA 100 MG/ML ~~LOC~~ SOAJ
100.0000 mg | SUBCUTANEOUS | 0 refills | Status: DC
  Filled 2021-11-28: qty 1, 28d supply, fill #0

## 2021-11-28 NOTE — Progress Notes (Signed)
Nucala BIV started in separate telephone encounter ? ?Chesley Mires, PharmD, MPH, BCPS ?Clinical Pharmacist (Rheumatology and Pulmonology) ?

## 2021-11-28 NOTE — Telephone Encounter (Signed)
Delivery instructions have been updated in [place], medication will be couriered to [facility] by 12/08/21. ? ?Rx has been processed in [facility] and the patient has no copay at this time. ?

## 2021-11-28 NOTE — Telephone Encounter (Addendum)
Submitted a Prior Authorization request to [facility] for NUCALA via CoverMyMeds. Will update once we receive a response. ? ?Key: [id number] ? ?Chesley Mires, PharmD, MPH, BCPS ?Clinical Pharmacist (Rheumatology and Pulmonology) ? ?----- Message from Chi Mechele Collin, MD sent at 11/27/2021  5:43 PM EDT ----- ?Would like to start patient on Nucala. Unfortunately patient filled out paperwork incorrectly and will need to mail her form. Please keep her on working list to contact to initiate biologic. Thank you - JE ?

## 2021-11-28 NOTE — Telephone Encounter (Signed)
Received notification from [facility] regarding a prior authorization for NUCALA. Authorization has been APPROVED from 11/28/21 to 05/30/22.  ? ?Per test claim, patient has no copay for 28 day supply. ? ?Patient can fill through [facility]: [phone]  ? ?Authorization # [id number] ?Phone # [phone] ? ?Patient scheduled for Nucala new start on 12/11/21. She states that transportation is an issue. She states she is scared of injections but is willing to try if this will help her feel better. ? ?Rx sent to [facility] to be couriered to clinic by 12/11/21 ? ?Chesley Mires, PharmD, MPH, BCPS ?Clinical Pharmacist (Rheumatology and Pulmonology) ?

## 2021-12-01 NOTE — Telephone Encounter (Signed)
OV note and clearance form have been faxed back to [facility]. Nothing further needed. ?

## 2021-12-05 ENCOUNTER — Other Ambulatory Visit (HOSPITAL_COMMUNITY): Payer: Self-pay

## 2021-12-11 ENCOUNTER — Ambulatory Visit (INDEPENDENT_AMBULATORY_CARE_PROVIDER_SITE_OTHER): Payer: Medicare Other | Admitting: Pharmacist

## 2021-12-11 ENCOUNTER — Other Ambulatory Visit (HOSPITAL_COMMUNITY): Payer: Self-pay

## 2021-12-11 DIAGNOSIS — J4551 Severe persistent asthma with (acute) exacerbation: Secondary | ICD-10-CM

## 2021-12-11 MED ORDER — ADVAIR HFA 230-21 MCG/ACT IN AERO: 2.0000 | INHALATION_SPRAY | Freq: Two times a day (BID) | RESPIRATORY_TRACT | 12 refills | Status: DC

## 2021-12-11 MED ORDER — NUCALA 100 MG/ML ~~LOC~~ SOAJ
100.0000 mg | SUBCUTANEOUS | 0 refills | Status: DC
  Filled 2021-12-11 – 2021-12-12 (×2): qty 1, 28d supply, fill #0

## 2021-12-11 NOTE — Progress Notes (Signed)
? ?HPI ?Patient presents today to [facility] to see pharmacy team for [facility] new start for severe persistent asthma/COPD overlap.  Past medical history includes NSTEMI, HTN, cardiac arrest, GERD, dysphagia, neuropathy, lumbar radiculopathy,  HLD, MDD, bipolar, history of alcohol use disorder. She is former smoker. ? ?She does have a cough today and noticeable wheezing. Has had two exacerbations in past two months.  ? ?Today, she expresses nervousness with injecting medication since she has no experience with self-injecting. ? ?Respiratory Medications ?Current regimen: Advair 230/21 mcg (2 puffs twice daily), cetirizine 10mg  daily ?Tried in past: Trelegy ?Patient reports no known adherence challenges ? ?OBJECTIVE ?Allergies  ?Allergen Reactions  ? Penicillins Anaphylaxis  ?  Has patient had a PCN reaction causing immediate rash, facial/tongue/throat swelling, SOB or lightheadedness with hypotension: Yes ?Has patient had a PCN reaction causing severe rash involving mucus membranes or skin necrosis: Yes ?Has patient had a PCN reaction that required hospitalization Yes ?Has patient had a PCN reaction occurring within the last 10 years: No-more than 10 years ago ?If all of the above answers are "NO", then may proceed with Cephalosporin use. ?  ? Penicillins Anaphylaxis  ?  DID THE REACTION INVOLVE: Swelling of the face/tongue/throat, SOB, or low BP? Yes ?Sudden or severe rash/hives, skin peeling, or the inside of the mouth or nose? Yes ?Did it require medical treatment? No ?When did it last happen? Within the past 10 years ?If all above answers are ?NO?, may proceed with cephalosporin use. ? ?  ? Asa [Aspirin] Rash  ? Aspirin Rash  ? ? ?Outpatient Encounter Medications as of 12/11/2021  ?Medication Sig  ? albuterol (VENTOLIN HFA) 108 (90 Base) MCG/ACT inhaler Inhale 2 puffs into the lungs every 4 (four) hours as needed for wheezing or shortness of breath.  ? ALPRAZolam (XANAX) 0.5 MG tablet Take 0.5 mg by mouth  daily as needed.  ? amLODipine (NORVASC) 5 MG tablet Take 1 tablet (5 mg total) by mouth daily.  ? atorvastatin (LIPITOR) 20 MG tablet Take 20 mg by mouth daily.  ? buprenorphine (BUTRANS) 20 MCG/HR PTWK Place 1 patch onto the skin once a week.  ? buPROPion (WELLBUTRIN SR) 150 MG 12 hr tablet Take 150 mg by mouth 2 (two) times daily.  ? cetirizine (ZYRTEC) 10 MG tablet Take 10 mg by mouth daily.  ? famotidine (PEPCID) 20 MG tablet Take 20 mg by mouth 2 (two) times daily.  ? fluticasone-salmeterol (ADVAIR HFA) 230-21 MCG/ACT inhaler Inhale 2 puffs into the lungs 2 (two) times daily.  ? haloperidol (HALDOL) 10 MG tablet Take 10 mg by mouth 2 (two) times daily.  ? hydrOXYzine (ATARAX/VISTARIL) 50 MG tablet Take 50 mg by mouth 3 (three) times daily as needed.  ? isosorbide mononitrate (IMDUR) 120 MG 24 hr tablet Take 120 mg by mouth daily.  ? omeprazole (PRILOSEC) 20 MG capsule Take 20 mg by mouth in the morning and at bedtime.  ? oxyCODONE-acetaminophen (PERCOCET) 10-325 MG tablet Take 1 tablet by mouth every 4 (four) hours as needed for pain.  ? trazodone (DESYREL) 300 MG tablet Take 300 mg by mouth at bedtime.  ? [DISCONTINUED] atorvastatin (LIPITOR) 10 MG tablet Take 10 mg by mouth at bedtime.  ? estrogen, conjugated,-medroxyprogesterone (PREMPRO) 0.3-1.5 MG tablet Take 1 tablet by mouth daily. (Patient not taking: Reported on 12/11/2021)  ? fluticasone (FLONASE) 50 MCG/ACT nasal spray Place into both nostrils. (Patient not taking: Reported on 08/06/2021)  ? meloxicam (MOBIC) 7.5 MG tablet Take 7.5 mg  by mouth daily. (Patient not taking: Reported on 12/11/2021)  ? Mepolizumab (NUCALA) 100 MG/ML SOAJ Inject 1 mL (100 mg total) into the skin every 28 (twenty-eight) days. [facility] SECOND dose to pulm: [address], [place] [address]. Appt on 01/08/22  ? [DISCONTINUED] atorvastatin (LIPITOR) 40 MG tablet Take 1 tablet (40 mg total) by mouth daily. (Patient not taking: Reported on 11/27/2021)  ? [DISCONTINUED]  benztropine (COGENTIN) 0.5 MG tablet Take 1 tablet (0.5 mg total) by mouth 2 (two) times daily as needed for tremors (EPS).  ? [DISCONTINUED] cetirizine (ZYRTEC) 10 MG tablet Take 1 tablet (10 mg total) by mouth daily.  ? [DISCONTINUED] cloNIDine (CATAPRES) 0.1 MG tablet Take 1 tablet (0.1 mg total) by mouth at bedtime as needed. For hot flashes  ? [DISCONTINUED] cyclobenzaprine (FLEXERIL) 10 MG tablet Take 10 mg by mouth 3 (three) times daily. (Patient not taking: Reported on 11/27/2021)  ? [DISCONTINUED] diclofenac (VOLTAREN) 75 MG EC tablet Take 1 tablet (75 mg total) by mouth 2 (two) times daily. (Patient not taking: Reported on 11/27/2021)  ? [DISCONTINUED] DULoxetine (CYMBALTA) 60 MG capsule Take 1 capsule (60 mg total) by mouth daily.  ? [DISCONTINUED] gabapentin (NEURONTIN) 100 MG capsule Take 1 capsule (100 mg total) by mouth 3 (three) times daily.  ? [DISCONTINUED] haloperidol (HALDOL) 5 MG tablet Take 1 tablet (5 mg total) by mouth 2 (two) times daily.  ? [DISCONTINUED] HYDROcodone-acetaminophen (NORCO) 10-325 MG tablet Take 1 tablet by mouth every 4 (four) hours as needed. (Patient not taking: Reported on 12/11/2021)  ? [DISCONTINUED] ipratropium (ATROVENT) 0.03 % nasal spray Place 2 sprays into both nostrils 2 (two) times daily.  ? [DISCONTINUED] isosorbide mononitrate (IMDUR) 30 MG 24 hr tablet Take 2.5 tablets (75 mg total) by mouth daily.  ? [DISCONTINUED] Mepolizumab (NUCALA) 100 MG/ML SOAJ Inject 1 mL (100 mg total) into the skin every 28 (twenty-eight) days. [facility] to pulm: [address], [place] [address]. Appt on 12/11/21  ? [DISCONTINUED] metoprolol tartrate (LOPRESSOR) 25 MG tablet Take 1 tablet (25 mg total) by mouth 2 (two) times daily.  ? [DISCONTINUED] pantoprazole (PROTONIX) 40 MG tablet Take 40 mg by mouth every morning. (Patient not taking: Reported on 08/06/2021)  ? [DISCONTINUED] SUMAtriptan (IMITREX) 50 MG tablet TAKE 1 TABLET BY MOUTH AT THE ONSET OF A MIGRAINE. MAY  REPEAT IN 2 HOURS IF HEADACHE PERSISTS OR RECURS. MAXIMUM DAILY DOSE 200 MG  ? [DISCONTINUED] SYMBICORT 80-4.5 MCG/ACT inhaler SMARTSIG:2 Puff(s) By Mouth Twice Daily  ? [DISCONTINUED] topiramate (TOPAMAX) 25 MG tablet   ? [DISCONTINUED] venlafaxine XR (EFFEXOR-XR) 37.5 MG 24 hr capsule Take 37.5 mg by mouth daily. (Patient not taking: Reported on 11/27/2021)  ? ?No facility-administered encounter medications on file as of 12/11/2021.  ?  ? ?Immunization History  ?Administered Date(s) Administered  ? Moderna SARS-COV2 Booster Vaccination 08/30/2020  ? Moderna Sars-Covid-2 Vaccination 11/02/2019, 12/05/2019  ? Tdap 12/15/2015  ?  ? ?PFTs ?   ? View : No data to display.  ?  ?  ?  ?  ? ?Eosinophils ?Most recent blood eosinophil count was 400 cells/microL taken on 11/13/21, 200 previously on 03/03/21, 03/01/21, 02/21/21.  ? ?IgE: 8 on 11/13/21 - wnl ? ? ?Assessment  ? ?Biologics training for mepolizumab [facility]) ? ?Goals of therapy: ?Mechanism of Action: Not fully understood. It does act an interleukin-5 (IL-5) antagonist monoclonal antibody that reduces the production and survival of eosinophils by blocking the binding of IL-5 to the alpha  chain of the receptor complex on the eosinophil cell surface. ?Reviewed that Nucala is add-on medication and patient must continue maintenance inhaler regimen. ?Response to therapy: may take 3 months to 6 months to determine efficacy. Discussed that patients generally feel improvement sooner than 3 months. ? ?Side effects: headache (19%), injection site reaction (7-15%), antibody development (6%), backache (5%), fatigue (5%) ? ?Dose: 100 mg subcutaneously every 4 weeks ? ?Administration/Storage:  ?Reviewed administration sites of thigh or abdomen (at least 2-3 inches away from abdomen). Reviewed the upper arm is only appropriate if caregiver is administering injection  ?Do not shake the reconstituted solution as this could lead to product foaming or precipitation. ?Solution should be clear to  opalescent and colorless to pale yellow or pale brown, essentially particle free. Small air bubbles, however, are expected and acceptable. If particulate matter remains in the solution or if the solution a

## 2021-12-11 NOTE — Patient Instructions (Signed)
I'll see you again on 01/08/22 for second Nucala dose ? ?CONTINUE ADVAIR inhaler twice daily ? ?How to manage an injection site reaction: ?Remember the 5 C's: ?COUNTER - leave on the counter at least 30 minutes but up to overnight to bring medication to room temperature. This may help prevent stinging ?COLD - place something cold (like an ice gel pack or cold water bottle) on the injection site just before cleansing with alcohol. This may help reduce pain ?CLARITIN - use Claritin (generic name is loratadine) for the first two weeks of treatment or the day of, the day before, and the day after injecting. This will help to minimize injection site reactions ?CORTISONE CREAM - apply if injection site is irritated and itching ?CALL ME - if injection site reaction is bigger than the size of your fist, looks infected, blisters, or if you develop hives  ?

## 2021-12-12 ENCOUNTER — Other Ambulatory Visit (HOSPITAL_COMMUNITY): Payer: Self-pay

## 2021-12-18 ENCOUNTER — Ambulatory Visit (INDEPENDENT_AMBULATORY_CARE_PROVIDER_SITE_OTHER): Payer: Medicare Other | Admitting: Sports Medicine

## 2021-12-18 DIAGNOSIS — B070 Plantar wart: Secondary | ICD-10-CM

## 2021-12-18 DIAGNOSIS — Q828 Other specified congenital malformations of skin: Secondary | ICD-10-CM

## 2021-12-18 DIAGNOSIS — M79671 Pain in right foot: Secondary | ICD-10-CM

## 2021-12-18 DIAGNOSIS — M79672 Pain in left foot: Secondary | ICD-10-CM

## 2021-12-18 DIAGNOSIS — G629 Polyneuropathy, unspecified: Secondary | ICD-10-CM | POA: Diagnosis not present

## 2021-12-18 NOTE — Progress Notes (Signed)
?Subjective: ?Amanda [name] is a 56 y.o. female patient who returns to office for follow up to callus skin/wart plantar surfaces of both feet.  Patient reports that she is doing fine. Started on a new pulmonary medication and got a shot for the numbness in her hand. No other changes since last encounter. ? ?Patient Active Problem List  ? Diagnosis Date Noted  ? Severe persistent asthma with acute exacerbation 11/27/2021  ? Bipolar I disorder, most recent episode depressed, severe w psychosis (HCC) 02/25/2021  ? Alcohol abuse 02/25/2021  ? Abnormal weight loss 12/19/2020  ? Change in bowel habit 12/19/2020  ? Colon cancer screening 12/19/2020  ? Diarrhea 12/19/2020  ? Dysphagia 12/19/2020  ? Epigastric pain 12/19/2020  ? Family history of malignant neoplasm of gastrointestinal tract 12/19/2020  ? Non-ST elevation (NSTEMI) myocardial infarction [facility])   ? Contusion of chest 08/26/2018  ? Cardiac arrest (HCC) 08/26/2018  ? Cocaine abuse (HCC) 08/26/2018  ? HLD (hyperlipidemia) 08/26/2018  ? Elevated LFTs 08/26/2018  ? History of migraine   ? Degenerative joint disease 07/26/2017  ? Morbid obesity (HCC) 12/24/2016  ? Hyperlipidemia 12/23/2016  ? Painful orthopaedic hardware (HCC) 03/24/2016  ? Corns and callosities 03/24/2016  ? PUD (peptic ulcer disease) 01/02/2016  ? Onychomycosis 01/02/2016  ? Seasonal allergies 11/19/2015  ? GERD (gastroesophageal reflux disease) 08/06/2015  ? Asthma 08/06/2015  ? Migraine 08/06/2015  ? Bipolar disorder (HCC) 07/25/2015  ? Neuropathy 07/08/2015  ? Major depressive disorder, recurrent, severe without psychotic features (HCC)   ? Alcohol use disorder, severe, dependence (HCC) 11/16/2014  ? Cocaine use disorder, severe, dependence (HCC) 11/16/2014  ? Acute calculous cholecystitis s/p lap chole 02/09/2014 02/09/2014  ? HTN (hypertension) 07/25/2013  ? Carpal tunnel syndrome 11/18/2012  ? Lumbar radiculopathy 11/18/2012  ? Anxiety state, unspecified 11/18/2012  ? ? ?Current  Outpatient Medications on File Prior to Visit  ?Medication Sig Dispense Refill  ? albuterol (VENTOLIN HFA) 108 (90 Base) MCG/ACT inhaler Inhale 2 puffs into the lungs every 4 (four) hours as needed for wheezing or shortness of breath.    ? ALPRAZolam (XANAX) 0.5 MG tablet Take 0.5 mg by mouth daily as needed.    ? amLODipine (NORVASC) 5 MG tablet Take 1 tablet (5 mg total) by mouth daily. 30 tablet 6  ? atorvastatin (LIPITOR) 20 MG tablet Take 20 mg by mouth daily.    ? buprenorphine (BUTRANS) 20 MCG/HR PTWK Place 1 patch onto the skin once a week.    ? buPROPion (WELLBUTRIN SR) 150 MG 12 hr tablet Take 150 mg by mouth 2 (two) times daily.    ? cetirizine (ZYRTEC) 10 MG tablet Take 10 mg by mouth daily.    ? estrogen, conjugated,-medroxyprogesterone (PREMPRO) 0.3-1.5 MG tablet Take 1 tablet by mouth daily. (Patient not taking: Reported on 12/11/2021) 30 tablet 5  ? famotidine (PEPCID) 20 MG tablet Take 20 mg by mouth 2 (two) times daily.    ? fluticasone (FLONASE) 50 MCG/ACT nasal spray Place into both nostrils. (Patient not taking: Reported on 08/06/2021)    ? fluticasone-salmeterol (ADVAIR HFA) 230-21 MCG/ACT inhaler Inhale 2 puffs into the lungs 2 (two) times daily. 1 each 12  ? haloperidol (HALDOL) 10 MG tablet Take 10 mg by mouth 2 (two) times daily.    ? hydrOXYzine (ATARAX/VISTARIL) 50 MG tablet Take 50 mg by mouth 3 (three) times daily as needed.    ? isosorbide mononitrate (IMDUR) 120 MG 24 hr tablet Take 120 mg by mouth daily.    ?  meloxicam (MOBIC) 7.5 MG tablet Take 7.5 mg by mouth daily. (Patient not taking: Reported on 12/11/2021)    ? Mepolizumab (NUCALA) 100 MG/ML SOAJ Inject 1 mL (100 mg total) into the skin every 28 (twenty-eight) days. [facility] SECOND dose to pulm: [address], [place] [id number]. Appt on 01/08/22 1 mL 0  ? omeprazole (PRILOSEC) 20 MG capsule Take 20 mg by mouth in the morning and at bedtime.    ? oxyCODONE-acetaminophen (PERCOCET) 10-325 MG tablet Take 1 tablet by  mouth every 4 (four) hours as needed for pain.    ? trazodone (DESYREL) 300 MG tablet Take 300 mg by mouth at bedtime.    ? [DISCONTINUED] cloNIDine (CATAPRES) 0.1 MG tablet Take 1 tablet (0.1 mg total) by mouth at bedtime as needed. For hot flashes 30 tablet 3  ? [DISCONTINUED] ipratropium (ATROVENT) 0.03 % nasal spray Place 2 sprays into both nostrils 2 (two) times daily.    ? [DISCONTINUED] SUMAtriptan (IMITREX) 50 MG tablet TAKE 1 TABLET BY MOUTH AT THE ONSET OF A MIGRAINE. MAY REPEAT IN 2 HOURS IF HEADACHE PERSISTS OR RECURS. MAXIMUM DAILY DOSE 200 MG 9 tablet 2  ? [DISCONTINUED] SYMBICORT 80-4.5 MCG/ACT inhaler SMARTSIG:2 Puff(s) By Mouth Twice Daily    ? [DISCONTINUED] topiramate (TOPAMAX) 25 MG tablet     ? ?No current facility-administered medications on file prior to visit.  ? ? ?Allergies  ?Allergen Reactions  ? Penicillins Anaphylaxis  ?  Has patient had a PCN reaction causing immediate rash, facial/tongue/throat swelling, SOB or lightheadedness with hypotension: Yes ?Has patient had a PCN reaction causing severe rash involving mucus membranes or skin necrosis: Yes ?Has patient had a PCN reaction that required hospitalization Yes ?Has patient had a PCN reaction occurring within the last 10 years: No-more than 10 years ago ?If all of the above answers are "NO", then may proceed with Cephalosporin use. ?  ? Penicillins Anaphylaxis  ?  DID THE REACTION INVOLVE: Swelling of the face/tongue/throat, SOB, or low BP? Yes ?Sudden or severe rash/hives, skin peeling, or the inside of the mouth or nose? Yes ?Did it require medical treatment? No ?When did it last happen? Within the past 10 years ?If all above answers are ?NO?, may proceed with cephalosporin use. ? ?  ? Asa [Aspirin] Rash  ? Aspirin Rash  ? ? ?Objective:  ?General: Alert and oriented x3 in no acute distress ? ?Dermatology: Keratotic lesion present plantar foot right greater than left at the cuboid and lateral heel on the right and submet 5 on the left  with skin lines transversing the lesion, pain is present with direct pressure to the lesion with a central nucleated core noted, that appears to be improving, no webspace macerations, no ecchymosis bilateral, all nails x 8 are short and thickened, there is minimal nails noted to bilateral hallux from previous history of nail removal. ? ?Vascular: Dorsalis Pedis and Posterior Tibial pedal pulses 1/4, Capillary Fill Time 5 seconds, decreased pedal hair growth bilateral, no edema bilateral lower extremities, Temperature gradient within normal limits. ? ?Neurology: Gross sensation intact via light touch bilateral.   ? ?Musculoskeletal: Mild tenderness with palpation at the keratotic lesion site on Right>Left, Muscular strength 5/5 in all groups without pain or limitation on range of motion.  Pes planus foot deformity noted bilateral. ? ?Assessment and Plan: ?Problem List Items Addressed This Visit   ? ?  ? Nervous and Auditory  ? Neuropathy  ? ?Other Visit Diagnoses   ? ?  Plantar wart    -  Primary  ? Porokeratosis      ? Foot pain, bilateral      ? ?  ? ?-Complete examination performed ?-Parred painful keratoic lesions/? wart using a chisel x3 blade; treated the area with Salinocaine to area covered with Band-Aids and advised patient to leave in place until tomorrow like before ?-Patient to return to office 4-6 weeks for follow-up on keratotic lesions and for nail trim or sooner if condition worsens. ? ?Asencion Islam, DPM ?

## 2022-01-01 ENCOUNTER — Other Ambulatory Visit (HOSPITAL_COMMUNITY): Payer: Self-pay

## 2022-01-02 NOTE — Progress Notes (Signed)
HPI Patient presents today to [facility] to see pharmacy team to complete second dose of Nucala together.  She is SOB and wheezing.  Past medical history includes severe persistent asthma/COPD overlap, NSTEMI, HTN, cardiac arrest, GERD, dysphagia, neuropathy, lumbar radiculopathy,  HLD, MDD, bipolar, history of alcohol use disorder. She is former smoker.  She expresses concern about medication possibly worsening her arthritis. She reports she felt better about one week after first Nucala injection but backaches have worsened in te past couple of weeks. She also states that in hte past couple of days, her asthma symptoms have worsened despite adherence to her maintenance medication regimen.  She is concerned about her current living environment due to asthma triggers, inconsistent electricity, and drug use. She does have housing at [facility] [address] complex. Her living room has no or unreliable electricity. She states that she has to walk to her bedroom to plug in nebulizer and receive treatment which is difficult. Additionally she states that she lives on the second floor and it is extremely difficult for her to walk up and down these stairs due to her asthma  Respiratory Medications Current regimen: Advair 230/21 mcg (2 puffs twice daily), cetirizine 10mg  daily Tried in past: Trelegy Patient reports no known adherence challenges  OBJECTIVE Allergies  Allergen Reactions   Penicillins Anaphylaxis    Has patient had a PCN reaction causing immediate rash, facial/tongue/throat swelling, SOB or lightheadedness with hypotension: Yes Has patient had a PCN reaction causing severe rash involving mucus membranes or skin necrosis: Yes Has patient had a PCN reaction that required hospitalization Yes Has patient had a PCN reaction occurring within the last 10 years: No-more than 10 years ago If all of the above answers are "NO", then may proceed with Cephalosporin use.    Penicillins Anaphylaxis     DID THE REACTION INVOLVE: Swelling of the face/tongue/throat, SOB, or low BP? Yes Sudden or severe rash/hives, skin peeling, or the inside of the mouth or nose? Yes Did it require medical treatment? No When did it last happen? Within the past 10 years If all above answers are "NO", may proceed with cephalosporin use.     Asa [Aspirin] Rash   Aspirin Rash    Outpatient Encounter Medications as of 01/08/2022  Medication Sig   albuterol (VENTOLIN HFA) 108 (90 Base) MCG/ACT inhaler Inhale 2 puffs into the lungs every 4 (four) hours as needed for wheezing or shortness of breath.   ALPRAZolam (XANAX) 0.5 MG tablet Take 0.5 mg by mouth daily as needed.   amLODipine (NORVASC) 5 MG tablet Take 1 tablet (5 mg total) by mouth daily.   atorvastatin (LIPITOR) 20 MG tablet Take 20 mg by mouth daily.   buprenorphine (BUTRANS) 20 MCG/HR PTWK Place 1 patch onto the skin once a week.   buPROPion (WELLBUTRIN SR) 150 MG 12 hr tablet Take 150 mg by mouth 2 (two) times daily.   cetirizine (ZYRTEC) 10 MG tablet Take 10 mg by mouth daily.   estrogen, conjugated,-medroxyprogesterone (PREMPRO) 0.3-1.5 MG tablet Take 1 tablet by mouth daily. (Patient not taking: Reported on 12/11/2021)   famotidine (PEPCID) 20 MG tablet Take 20 mg by mouth 2 (two) times daily.   fluticasone (FLONASE) 50 MCG/ACT nasal spray Place into both nostrils. (Patient not taking: Reported on 08/06/2021)   fluticasone-salmeterol (ADVAIR HFA) 230-21 MCG/ACT inhaler Inhale 2 puffs into the lungs 2 (two) times daily.   haloperidol (HALDOL) 10 MG tablet Take 10 mg by mouth 2 (two) times daily.  hydrOXYzine (ATARAX/VISTARIL) 50 MG tablet Take 50 mg by mouth 3 (three) times daily as needed.   isosorbide mononitrate (IMDUR) 120 MG 24 hr tablet Take 120 mg by mouth daily.   meloxicam (MOBIC) 7.5 MG tablet Take 7.5 mg by mouth daily. (Patient not taking: Reported on 12/11/2021)   Mepolizumab (NUCALA) 100 MG/ML SOAJ Inject 1 mL (100 mg total) into the  skin every 28 (twenty-eight) days. [facility] SECOND dose to pulm: [address], [place] [address]. Appt on 01/08/22   omeprazole (PRILOSEC) 20 MG capsule Take 20 mg by mouth in the morning and at bedtime.   oxyCODONE-acetaminophen (PERCOCET) 10-325 MG tablet Take 1 tablet by mouth every 4 (four) hours as needed for pain.   trazodone (DESYREL) 300 MG tablet Take 300 mg by mouth at bedtime.   [DISCONTINUED] cloNIDine (CATAPRES) 0.1 MG tablet Take 1 tablet (0.1 mg total) by mouth at bedtime as needed. For hot flashes   [DISCONTINUED] ipratropium (ATROVENT) 0.03 % nasal spray Place 2 sprays into both nostrils 2 (two) times daily.   [DISCONTINUED] SUMAtriptan (IMITREX) 50 MG tablet TAKE 1 TABLET BY MOUTH AT THE ONSET OF A MIGRAINE. MAY REPEAT IN 2 HOURS IF HEADACHE PERSISTS OR RECURS. MAXIMUM DAILY DOSE 200 MG   [DISCONTINUED] SYMBICORT 80-4.5 MCG/ACT inhaler SMARTSIG:2 Puff(s) By Mouth Twice Daily   [DISCONTINUED] topiramate (TOPAMAX) 25 MG tablet    No facility-administered encounter medications on file as of 01/08/2022.     Immunization History  Administered Date(s) Administered   [employer] Booster Vaccination 08/30/2020   Moderna Sars-Covid-2 Vaccination 11/02/2019, 12/05/2019   Tdap 12/15/2015     PFTs     View : No data to display.           Eosinophils Most recent blood eosinophil count was 400 cells/microL taken on 11/13/21, 200 previously on 03/03/21, 03/01/21, 02/21/21.    IgE: 8 on 11/13/21 - wnl   Assessment   Biologics training for mepolizumab (Nucala)  Goals of therapy: Mechanism of Action: Not fully understood. It does act an interleukin-5 (IL-5) antagonist monoclonal antibody that reduces the production and survival of eosinophils by blocking the binding of IL-5 to the alpha chain of the receptor complex on the eosinophil cell surface. Reviewed that Nucala is add-on medication and patient must continue maintenance inhaler regimen. Response to therapy:  may take 3 months to 6 months to determine efficacy. Discussed that patients generally feel improvement sooner than 3 months.  Side effects: headache (19%), injection site reaction (7-15%), antibody development (6%), backache (5%), fatigue (5%)  Dose: 100 mg subcutaneously every 4 weeks  Administration/Storage:  Reviewed administration sites of thigh or abdomen (at least 2-3 inches away from abdomen). Reviewed the upper arm is only appropriate if caregiver is administering injection  Do not shake the reconstituted solution as this could lead to product foaming or precipitation. Solution should be clear to opalescent and colorless to pale yellow or pale brown, essentially particle free. Small air bubbles, however, are expected and acceptable. If particulate matter remains in the solution or if the solution appears cloudy or milky, discard the solution.  Reviewed storage of medication in refrigerator. Reviewed that Nucala can be stored at room temperature in unopened carton for up to 7 days.  Access: Approval of Nucala through: insurance  Patient self-administered Nucala 100mg /mL in right abdomen using [facility] supplied medication: Nucala 100mg /mL autoinjector pen NDC: [phone] Lot: [id number] Expiration: 05/2024  Patient tolerated without issue.  Medication Reconciliation  A drug regimen  assessment was performed, including review of allergies, interactions, disease-state management, dosing and immunization history. Medications were reviewed with the patient, including name, instructions, indication, goals of therapy, potential side effects, importance of adherence, and safe use.  Drug interaction(s): none noted  Med list updated  PLAN Continue Nucala 100mg  every 4 weeks. Next dose is due 02/05/22 and every 4 weeks thereafter. Rx sent to: [facility]: [phone] . Patient provided with pharmacy phone number and advised that they will call her in 2-3 weeks to  schedule shipment to her home Continue maintenance asthma regimen of: Advair 230/21 mcg (2 puffs twice daily), cetirizine 10mg  daily Completed paperwork today with her and Dr. [phone] to start process for patient to move to new complex and to a first-floor apartment. Copy of this document sent to scan center for our office. She will plan to bring this to her housing services  All questions encouraged and answered.  Instructed patient to reach out with any further questions or concerns.  Thank you for allowing pharmacy to participate in this patient's care.  This appointment required 60 minutes of patient care (this includes precharting, chart review, review of results, face-to-face care, etc.).   , PharmD, MPH, BCPS, CPP Clinical Pharmacist (Rheumatology and Pulmonology)

## 2022-01-05 ENCOUNTER — Other Ambulatory Visit (HOSPITAL_COMMUNITY): Payer: Self-pay

## 2022-01-06 ENCOUNTER — Other Ambulatory Visit (HOSPITAL_COMMUNITY): Payer: Self-pay

## 2022-01-08 ENCOUNTER — Other Ambulatory Visit (HOSPITAL_COMMUNITY): Payer: Self-pay

## 2022-01-08 ENCOUNTER — Ambulatory Visit: Payer: Medicare Other | Admitting: Sports Medicine

## 2022-01-08 ENCOUNTER — Ambulatory Visit: Payer: Medicare Other | Admitting: Pharmacist

## 2022-01-08 DIAGNOSIS — Z7189 Other specified counseling: Secondary | ICD-10-CM

## 2022-01-08 DIAGNOSIS — J4551 Severe persistent asthma with (acute) exacerbation: Secondary | ICD-10-CM

## 2022-01-08 MED ORDER — NUCALA 100 MG/ML ~~LOC~~ SOAJ
100.0000 mg | SUBCUTANEOUS | 2 refills | Status: DC
  Filled 2022-01-08 – 2022-02-02 (×2): qty 1, 28d supply, fill #0
  Filled 2022-02-26: qty 1, 28d supply, fill #1
  Filled 2022-03-24: qty 1, 28d supply, fill #2

## 2022-01-08 NOTE — Patient Instructions (Signed)
Your next Nucala dose is due on 02/05/22, 03/05/22, and every 4 weeks thereafter  CONTINUE ADVAIR as prescribed  Your prescription will be shipped from [facility]. Their phone number is [phone] They will send your shipment to you in a few weeks  You will need to be seen by your provider in 3 to 4 months to assess how Nucala is working for you. Please ensure you have a follow-up appointment scheduled in July or September 2023. Call our clinic if you need to make this appointment.  How to manage an injection site reaction: Remember the 5 C's: COUNTER - leave on the counter at least 30 minutes but up to overnight to bring medication to room temperature. This may help prevent stinging COLD - place something cold (like an ice gel pack or cold water bottle) on the injection site just before cleansing with alcohol. This may help reduce pain CLARITIN - use Claritin (generic name is loratadine) for the first two weeks of treatment or the day of, the day before, and the day after injecting. This will help to minimize injection site reactions CORTISONE CREAM - apply if injection site is irritated and itching CALL ME - if injection site reaction is bigger than the size of your fist, looks infected, blisters, or if you develop hives

## 2022-01-13 ENCOUNTER — Ambulatory Visit (INDEPENDENT_AMBULATORY_CARE_PROVIDER_SITE_OTHER): Payer: Medicare Other | Admitting: Podiatry

## 2022-01-13 DIAGNOSIS — M79671 Pain in right foot: Secondary | ICD-10-CM

## 2022-01-13 DIAGNOSIS — B351 Tinea unguium: Secondary | ICD-10-CM

## 2022-01-13 DIAGNOSIS — B070 Plantar wart: Secondary | ICD-10-CM | POA: Diagnosis not present

## 2022-01-13 DIAGNOSIS — M79675 Pain in left toe(s): Secondary | ICD-10-CM

## 2022-01-13 DIAGNOSIS — G629 Polyneuropathy, unspecified: Secondary | ICD-10-CM

## 2022-01-13 DIAGNOSIS — M79672 Pain in left foot: Secondary | ICD-10-CM

## 2022-01-13 DIAGNOSIS — M79674 Pain in right toe(s): Secondary | ICD-10-CM

## 2022-01-19 ENCOUNTER — Encounter: Payer: Self-pay | Admitting: Podiatry

## 2022-01-19 NOTE — Progress Notes (Signed)
  Subjective:  Patient ID: Amanda [name], female    DOB: 1966/06/23,  MRN: [id number]  [name] presents to clinic today for follow up of plantar wart/porokeratosis bilateral feet. She is a former patient of Dr. Leeanne Rio and has been receiving t.reatment about every 4 weeks with periodic debridement with Salinocaine application.  Patient also has painful, elongated, mycotic toenails which are relieved with periodic debridement.  New problem(s): None.   PCP is Carylon Perches, NP.  Allergies  Allergen Reactions   Penicillins Anaphylaxis    Has patient had a PCN reaction causing immediate rash, facial/tongue/throat swelling, SOB or lightheadedness with hypotension: Yes Has patient had a PCN reaction causing severe rash involving mucus membranes or skin necrosis: Yes Has patient had a PCN reaction that required hospitalization Yes Has patient had a PCN reaction occurring within the last 10 years: No-more than 10 years ago If all of the above answers are "NO", then may proceed with Cephalosporin use.    Penicillins Anaphylaxis    DID THE REACTION INVOLVE: Swelling of the face/tongue/throat, SOB, or low BP? Yes Sudden or severe rash/hives, skin peeling, or the inside of the mouth or nose? Yes Did it require medical treatment? No When did it last happen? Within the past 10 years If all above answers are "NO", may proceed with cephalosporin use.     Asa [Aspirin] Rash   Aspirin Rash    Review of Systems: Negative except as noted in the HPI.  Objective: No changes noted in today's physical examination.  Physical Exam  General: Amanda [name] is a pleasant 56 y.o. African American female, WD, WN in NAD. AAO x 3.   Vascular:  Capillary refill time to digits immediate b/l. Palpable pedal pulses b/l LE. Pedal hair sparse. Lower extremity skin temperature gradient within normal limits.  Dermatological:  Pedal skin with normal turgor, texture and tone bilaterally. No open wounds  bilaterally. No interdigital macerations bilaterally. Symptomatic porokeratotic lesion(s) plantarlateral aspect of the right foot and submet head 5 left foot. No erythema, no edema, no drainage, no flocculence. Toenails 2-5 b/l are painful, elongated, discolored, dystrophic with subungual debris. Pain with dorsal palpation of nailplates. No erythema, no edema, no drainage noted.   Musculoskeletal:  Normal muscle strength 5/5 to all lower extremity muscle groups bilaterally. No gross bony deformities bilaterally. Limited joint ROM to the 1st MPJ of the right foot consistent with MPJ fusion. 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  Neurological:  Protective sensation intact 5/5 intact bilaterally with 10g monofilament b/l. Vibratory sensation intact b/l. Proprioception intact bilaterally.    Latest Ref Rng & Units 02/21/2021    9:40 AM  Hemoglobin A1C  Hemoglobin-A1c 4.8 - 5.6 % 4.8     Assessment/Plan: 1. Pain due to onychomycosis of toenails of both feet   2. Plantar wart   3. Foot pain, bilateral   4. Neuropathy     -Patient was evaluated and treated. All patient's and/or POA's questions/concerns answered on today's visit. -Verrucous porokeratotic lesions pared and enucleated. Salinocaine Ointment and dressing applied to both lesions. She is to remove on tomorrow.. -Toenails 2-5 bilaterally debrided in length and girth without iatrogenic bleeding with sterile nail nipper and dremel.  -Patient/POA to call should there be question/concern in the interim.   Return in about 4 weeks (around 02/10/2022).  Marzetta Board, DPM

## 2022-01-21 ENCOUNTER — Ambulatory Visit: Payer: Medicare Other | Admitting: Pulmonary Disease

## 2022-01-27 ENCOUNTER — Other Ambulatory Visit (HOSPITAL_COMMUNITY): Payer: Self-pay

## 2022-01-29 ENCOUNTER — Other Ambulatory Visit (HOSPITAL_COMMUNITY): Payer: Self-pay

## 2022-02-02 ENCOUNTER — Other Ambulatory Visit (HOSPITAL_COMMUNITY): Payer: Self-pay

## 2022-02-04 ENCOUNTER — Other Ambulatory Visit (HOSPITAL_COMMUNITY): Payer: Self-pay

## 2022-02-13 ENCOUNTER — Encounter: Payer: Self-pay | Admitting: Podiatry

## 2022-02-13 ENCOUNTER — Ambulatory Visit (INDEPENDENT_AMBULATORY_CARE_PROVIDER_SITE_OTHER): Payer: Medicare Other | Admitting: Podiatry

## 2022-02-13 ENCOUNTER — Ambulatory Visit (INDEPENDENT_AMBULATORY_CARE_PROVIDER_SITE_OTHER): Payer: Medicare Other

## 2022-02-13 DIAGNOSIS — B070 Plantar wart: Secondary | ICD-10-CM

## 2022-02-13 DIAGNOSIS — S99921A Unspecified injury of right foot, initial encounter: Secondary | ICD-10-CM

## 2022-02-13 DIAGNOSIS — S92524A Nondisplaced fracture of medial phalanx of right lesser toe(s), initial encounter for closed fracture: Secondary | ICD-10-CM | POA: Diagnosis not present

## 2022-02-23 NOTE — Progress Notes (Signed)
Subjective:  Patient ID: Amanda [name], female    DOB: [birth year],  MRN: [id number]  Amanda [name] presents to clinic today for follow up verruca plantar aspect of both feet with periodic treatment consisting of periodic debridement with application of Salinocaine Ointment.  New problem(s):  with chief concern of injury to R 2nd toe. Injury occurred last month. Injury recurred as a result of a traumatic incident of kicking a box. Patient states aggravating factor(s) is/are walking , weight bearing, and wearing enclosed shoe gear.  Patient has tried none.  PCP is Ellender Hose, NP , and last visit was April, 2023.  Allergies  Allergen Reactions   Penicillins Anaphylaxis    Has patient had a PCN reaction causing immediate rash, facial/tongue/throat swelling, SOB or lightheadedness with hypotension: Yes Has patient had a PCN reaction causing severe rash involving mucus membranes or skin necrosis: Yes Has patient had a PCN reaction that required hospitalization Yes Has patient had a PCN reaction occurring within the last 10 years: No-more than 10 years ago If all of the above answers are "NO", then may proceed with Cephalosporin use.    Penicillins Anaphylaxis    DID THE REACTION INVOLVE: Swelling of the face/tongue/throat, SOB, or low BP? Yes Sudden or severe rash/hives, skin peeling, or the inside of the mouth or nose? Yes Did it require medical treatment? No When did it last happen? Within the past 10 years If all above answers are "NO", may proceed with cephalosporin use.     Asa [Aspirin] Rash   Aspirin Rash    Review of Systems: Negative except as noted in the HPI.  Objective:  Physical Exam  General: Amanda [name] is a pleasant 56 y.o. African American female, WD, WN in NAD. AAO x 3.   Vascular:  Capillary refill time to digits immediate b/l. Palpable pedal pulses b/l LE. Pedal hair sparse. Lower extremity skin temperature gradient within normal  limits.  Dermatological:  Pedal skin with normal turgor, texture and tone bilaterally. No open wounds bilaterally. No interdigital macerations bilaterally. Symptomatic porokeratotic lesion(s)  with visible subcutaneous capillaries plantarlateral aspect of the right foot and submet head 5 left foot. No erythema, no edema, no drainage, no flocculence. Toenails 2-5 b/l recently debrided.   Musculoskeletal:  Normal muscle strength 5/5 to all lower extremity muscle groups bilaterally. No gross bony deformities bilaterally. Limited joint ROM to the 1st MPJ of the right foot consistent with MPJ fusion. 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  Right 2nd digit edematous at DIPJ with tenderness to palpation. Remains in good alignment with no displacement. It is tender to palpation..  Neurological:  Protective sensation intact 5/5 intact bilaterally with 10g monofilament b/l. Vibratory sensation intact b/l. Proprioception intact bilaterally.  Xray findings right foot: Soft tissue swelling present R 2nd toe. Impaction fracture noted middle phalanx of right 2nd toe with fragment noted medially and laterally. Right 2nd toe remains in good alignment.  Right first IPJ with fusion noted. Hardware remains intact.  Assessment/Plan: 1. Plantar wart   2. Toe injury, right, initial encounter   3. Closed nondisplaced fracture of middle phalanx of lesser toe of right foot, initial encounter     -Patient was evaluated and treated. All patient's and/or POA's questions/concerns answered on today's visit. -Xray of right  foot was performed and reviewed with patient and/or POA. -Discussed fracture right 2nd toe. Demonstrated buddy splinting. OTC pain medication prn. -Verrucous porokeratotic lesions pared and enucleated. Salinocaine Ointment and dressing applied to both lesions.  She is to remove on tomorrow. -Patient/POA to call should there be question/concern in the interim.   Return in about 4 weeks (around  03/13/2022).  Freddie Breech, DPM

## 2022-02-26 ENCOUNTER — Other Ambulatory Visit (HOSPITAL_COMMUNITY): Payer: Self-pay

## 2022-03-02 ENCOUNTER — Other Ambulatory Visit (HOSPITAL_COMMUNITY): Payer: Self-pay

## 2022-03-05 ENCOUNTER — Ambulatory Visit: Payer: Medicare Other | Admitting: Pulmonary Disease

## 2022-03-13 ENCOUNTER — Encounter: Payer: Self-pay | Admitting: Podiatry

## 2022-03-13 ENCOUNTER — Ambulatory Visit (INDEPENDENT_AMBULATORY_CARE_PROVIDER_SITE_OTHER): Payer: Medicare Other | Admitting: Podiatry

## 2022-03-13 DIAGNOSIS — B070 Plantar wart: Secondary | ICD-10-CM

## 2022-03-13 DIAGNOSIS — S92524A Nondisplaced fracture of medial phalanx of right lesser toe(s), initial encounter for closed fracture: Secondary | ICD-10-CM

## 2022-03-21 NOTE — Progress Notes (Signed)
  Subjective:  Patient ID: Amanda [name], female    DOB: 09/27/1965,  MRN: [id number]  Amanda [name] presents to clinic today for follow up fracture right 2nd toe  Patient states her toe is still painful. She has been buddysplinting daily.  PCP is Ellender Hose, NP , and last visit was  April, 2023.  Allergies  Allergen Reactions   Penicillins Anaphylaxis    Has patient had a PCN reaction causing immediate rash, facial/tongue/throat swelling, SOB or lightheadedness with hypotension: Yes Has patient had a PCN reaction causing severe rash involving mucus membranes or skin necrosis: Yes Has patient had a PCN reaction that required hospitalization Yes Has patient had a PCN reaction occurring within the last 10 years: No-more than 10 years ago If all of the above answers are "NO", then may proceed with Cephalosporin use.    Penicillins Anaphylaxis    DID THE REACTION INVOLVE: Swelling of the face/tongue/throat, SOB, or low BP? Yes Sudden or severe rash/hives, skin peeling, or the inside of the mouth or nose? Yes Did it require medical treatment? No When did it last happen? Within the past 10 years If all above answers are "NO", may proceed with cephalosporin use.     Asa [Aspirin] Rash   Aspirin Rash    Review of Systems: Negative except as noted in the HPI.  Objective: No changes noted in today's physical examination. General: Amanda [name] is a pleasant 56 y.o. African American female, WD, WN in NAD. AAO x 3.   Vascular:  Capillary refill time to digits immediate b/l. Palpable pedal pulses b/l LE. Pedal hair sparse. Lower extremity skin temperature gradient within normal limits.  Dermatological:  Pedal skin with normal turgor, texture and tone bilaterally. No open wounds bilaterally. No interdigital macerations bilaterally. Symptomatic porokeratotic lesion(s)  with visible subcutaneous capillaries plantarlateral aspect of the right foot and submet head 5 left foot. No  erythema, no edema, no drainage, no flocculence. Toenails 2-5 b/l recently debrided.   Musculoskeletal:  Normal muscle strength 5/5 to all lower extremity muscle groups bilaterally. No gross bony deformities bilaterally. Limited joint ROM to the 1st MPJ of the right foot consistent with MPJ fusion. 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   Right 2nd digit edematous at DIPJ with tenderness to palpation. Remains in good alignment with no displacement. It is tender to palpation.  Neurological:  Protective sensation intact 5/5 intact bilaterally with 10g monofilament b/l. Vibratory sensation intact b/l. Proprioception intact bilaterally.  Assessment/Plan: 1. Closed nondisplaced fracture of middle phalanx of lesser toe of right foot, initial encounter   2. Plantar wart     -Patient was evaluated and treated. All patient's and/or POA's questions/concerns answered on today's visit. -Examined patient. Continue buddy splinting of right 2nd digit. -Patient to see Dr. Allena Katz for evaluation of plantar warts. -Patient referred to Dr. Allena Katz for evaluation of fracture right 2nd toe. -Patient/POA to call should there be question/concern in the interim.   Return in about 3 weeks (around 04/03/2022).  Freddie Breech, DPM

## 2022-03-24 ENCOUNTER — Other Ambulatory Visit (HOSPITAL_COMMUNITY): Payer: Self-pay

## 2022-03-30 ENCOUNTER — Ambulatory Visit (INDEPENDENT_AMBULATORY_CARE_PROVIDER_SITE_OTHER): Payer: Medicare Other | Admitting: Pulmonary Disease

## 2022-03-30 DIAGNOSIS — J4541 Moderate persistent asthma with (acute) exacerbation: Secondary | ICD-10-CM | POA: Diagnosis not present

## 2022-03-30 NOTE — Progress Notes (Signed)
Full PFT Performed Today  

## 2022-03-30 NOTE — Patient Instructions (Signed)
Full PFT Performed Today  

## 2022-03-31 ENCOUNTER — Telehealth: Payer: Self-pay | Admitting: Pulmonary Disease

## 2022-03-31 ENCOUNTER — Other Ambulatory Visit (HOSPITAL_COMMUNITY): Payer: Self-pay

## 2022-03-31 LAB — PULMONARY FUNCTION TEST
DL/VA % pred: 105 %
DL/VA: 4.34 ml/min/mmHg/L
DLCO cor % pred: 66 %
DLCO cor: 15.54 ml/min/mmHg
DLCO unc % pred: 66 %
DLCO unc: 15.54 ml/min/mmHg
FEF 25-75 Post: 1.61 L/sec
FEF 25-75 Pre: 2.2 L/sec
FEF2575-%Change-Post: -26 %
FEF2575-%Pred-Post: 58 %
FEF2575-%Pred-Pre: 79 %
FEV1-%Change-Post: -5 %
FEV1-%Pred-Post: 52 %
FEV1-%Pred-Pre: 55 %
FEV1-Post: 1.6 L
FEV1-Pre: 1.7 L
FEV1FVC-%Change-Post: 0 %
FEV1FVC-%Pred-Pre: 106 %
FEV6-%Change-Post: -6 %
FEV6-%Pred-Post: 50 %
FEV6-%Pred-Pre: 53 %
FEV6-Post: 1.9 L
FEV6-Pre: 2.02 L
FEV6FVC-%Pred-Post: 103 %
FEV6FVC-%Pred-Pre: 103 %
FVC-%Change-Post: -6 %
FVC-%Pred-Post: 48 %
FVC-%Pred-Pre: 51 %
FVC-Post: 1.9 L
FVC-Pre: 2.02 L
Post FEV1/FVC ratio: 84 %
Post FEV6/FVC ratio: 100 %
Pre FEV1/FVC ratio: 84 %
Pre FEV6/FVC Ratio: 100 %
RV % pred: 109 %
RV: 2.31 L
TLC % pred: 80 %
TLC: 4.54 L

## 2022-03-31 NOTE — Telephone Encounter (Signed)
Please update patient on PFT results.  PFTs with reduced FEV1 and FVC however no obstructive or restrictive defect seen. DLCO mildly reduced. Will discuss further at next visit but would not make any changes in her medical management at this time. Continue Advair and Nucala as scheduled

## 2022-03-31 NOTE — Telephone Encounter (Signed)
Called patient and went over PFT results with patient from Dr Everardo All. Patient verbalized understanding. Nothing further needed

## 2022-04-01 ENCOUNTER — Other Ambulatory Visit (HOSPITAL_COMMUNITY): Payer: Self-pay

## 2022-04-03 ENCOUNTER — Ambulatory Visit (INDEPENDENT_AMBULATORY_CARE_PROVIDER_SITE_OTHER): Payer: Medicare Other

## 2022-04-03 ENCOUNTER — Ambulatory Visit (INDEPENDENT_AMBULATORY_CARE_PROVIDER_SITE_OTHER): Payer: Medicare Other | Admitting: Podiatry

## 2022-04-03 ENCOUNTER — Telehealth: Payer: Self-pay | Admitting: Pulmonary Disease

## 2022-04-03 DIAGNOSIS — Q828 Other specified congenital malformations of skin: Secondary | ICD-10-CM

## 2022-04-03 DIAGNOSIS — M216X1 Other acquired deformities of right foot: Secondary | ICD-10-CM

## 2022-04-03 DIAGNOSIS — Z01818 Encounter for other preprocedural examination: Secondary | ICD-10-CM | POA: Diagnosis not present

## 2022-04-03 DIAGNOSIS — M216X2 Other acquired deformities of left foot: Secondary | ICD-10-CM

## 2022-04-03 DIAGNOSIS — M898X9 Other specified disorders of bone, unspecified site: Secondary | ICD-10-CM

## 2022-04-03 NOTE — Telephone Encounter (Signed)
Patient called and would like the results of her pulmonary function test. Test was done 03/30/22.   Please advise Dr Everardo All

## 2022-04-06 NOTE — Telephone Encounter (Signed)
Called patient and discussed PFT results. She expressed appreciation for the call.

## 2022-04-10 NOTE — Progress Notes (Signed)
Subjective:  Patient ID: Amanda [name], female    DOB: 26-Jun-1966,  MRN: [id number]  Chief Complaint  Patient presents with   Fracture    56 y.o. female presents with the above complaint.  Patient presents with complaint of right plantarflexed fifth metatarsal and left fifth met base exostosis.  Patient states pain for touch is progressive gotten worse s she states that she is try conservative care with offloading padding protecting none of which has helped.  She would like to discuss surgical options at this time.  Hurts with ambulation hurts with pressure pain scale is 8 out of 10.  She has tried having it debrided down but none of which has helped it goes right back.  She would like to discuss more aggressive surgical options at this time.   Review of Systems: Negative except as noted in the HPI. Denies N/V/F/Ch.  Past Medical History:  Diagnosis Date   Anxiety    Arthritis    lower back   Asthma    Bipolar disorder ([place])    Carpal tunnel syndrome, bilateral    Chronic back pain    Depression    Diverticulosis    Fx. left wrist    GERD (gastroesophageal reflux disease)    Heart murmur    "born with"   Heart murmur    HLD (hyperlipidemia)    Hypertension    IBS (irritable bowel syndrome)    Internal hemorrhoids    Muscle spasms of neck    Neuropathy    PUD (peptic ulcer disease)     Current Outpatient Medications:    albuterol (VENTOLIN HFA) 108 (90 Base) MCG/ACT inhaler, Inhale 2 puffs into the lungs every 4 (four) hours as needed for wheezing or shortness of breath., Disp: , Rfl:    ALPRAZolam (XANAX) 0.25 MG tablet, TAKE 1 2 TO 1 (ONE HALF TO ONE) TABLET BY MOUTH TWICE DAILY, Disp: , Rfl:    ALPRAZolam (XANAX) 0.5 MG tablet, Take 0.5 mg by mouth daily as needed., Disp: , Rfl:    amLODipine (NORVASC) 5 MG tablet, Take 1 tablet (5 mg total) by mouth daily., Disp: 30 tablet, Rfl: 6   atorvastatin (LIPITOR) 20 MG tablet, Take 20 mg by mouth daily., Disp: , Rfl:     atorvastatin (LIPITOR) 40 MG tablet, , Disp: , Rfl:    BELBUCA 150 MCG FILM, Take 1 strip by mouth 2 (two) times daily., Disp: , Rfl:    BELBUCA 300 MCG FILM, Take 1 strip by mouth 2 (two) times daily., Disp: , Rfl:    buprenorphine (BUTRANS) 10 MCG/HR PTWK, 1 patch once a week., Disp: , Rfl:    buPROPion (WELLBUTRIN SR) 150 MG 12 hr tablet, Take 150 mg by mouth 2 (two) times daily., Disp: , Rfl:    cetirizine (ZYRTEC) 10 MG tablet, Take 10 mg by mouth daily., Disp: , Rfl:    cloNIDine (CATAPRES) 0.1 MG tablet, , Disp: , Rfl:    cyclobenzaprine (FLEXERIL) 10 MG tablet, , Disp: , Rfl:    dexlansoprazole (DEXILANT) 60 MG capsule, , Disp: , Rfl:    diltiazem (CARDIZEM CD) 240 MG 24 hr capsule, Take 240 mg by mouth daily., Disp: , Rfl:    divalproex (DEPAKOTE) 500 MG DR tablet, , Disp: , Rfl:    doxycycline (VIBRA-TABS) 100 MG tablet, Take 1 tablet by mouth 2 (two) times daily., Disp: , Rfl:    famotidine (PEPCID) 20 MG tablet, Take 20 mg by mouth 2 (two) times daily.,  Disp: , Rfl:    famotidine (PEPCID) 20 MG tablet, Take by mouth., Disp: , Rfl:    FARXIGA 10 MG TABS tablet, Take 10 mg by mouth daily., Disp: , Rfl:    fluticasone (FLONASE) 50 MCG/ACT nasal spray, , Disp: , Rfl:    fluticasone-salmeterol (ADVAIR HFA) 230-21 MCG/ACT inhaler, Inhale 2 puffs into the lungs 2 (two) times daily., Disp: 1 each, Rfl: 12   gabapentin (NEURONTIN) 100 MG capsule, Take 100 mg by mouth 3 (three) times daily., Disp: , Rfl:    gabapentin (NEURONTIN) 800 MG tablet, , Disp: , Rfl:    haloperidol (HALDOL) 10 MG tablet, Take 10 mg by mouth 2 (two) times daily., Disp: , Rfl:    haloperidol (HALDOL) 5 MG tablet, , Disp: , Rfl:    hydrochlorothiazide (HYDRODIURIL) 25 MG tablet, hydrochlorothiazide 25 mg tablet, Disp: , Rfl:    hydrOXYzine (ATARAX) 50 MG tablet, Take 1 tablet by mouth 4 (four) times daily., Disp: , Rfl:    hydrOXYzine (ATARAX/VISTARIL) 50 MG tablet, Take 50 mg by mouth 3 (three) times daily as needed.,  Disp: , Rfl:    ipratropium (ATROVENT) 0.03 % nasal spray, SMARTSIG:1 Spray(s) Both Nares Twice Daily PRN, Disp: , Rfl:    isosorbide mononitrate (IMDUR) 120 MG 24 hr tablet, Take 120 mg by mouth daily., Disp: , Rfl:    Lacosamide 100 MG TABS, Take by mouth., Disp: , Rfl:    levofloxacin (LEVAQUIN) 750 MG tablet, , Disp: , Rfl:    lidocaine, PF, (XYLOCAINE) 1 % SOLN injection, by Infiltration route., Disp: , Rfl:    LINZESS 145 MCG CAPS capsule, Take 145 mcg by mouth daily., Disp: , Rfl:    meloxicam (MOBIC) 7.5 MG tablet, Take 1 tablet by mouth daily., Disp: , Rfl:    Mepolizumab (NUCALA) 100 MG/ML SOAJ, Inject 1 mL (100 mg total) into the skin every 28 (twenty-eight) days., Disp: 1 mL, Rfl: 2   methylPREDNISolone (MEDROL) 8 MG tablet, Take by mouth., Disp: , Rfl:    metoprolol tartrate (LOPRESSOR) 50 MG tablet, Take 50 mg by mouth 2 (two) times daily., Disp: , Rfl:    Milnacipran HCl (SAVELLA) 100 MG TABS tablet, Take by mouth., Disp: , Rfl:    montelukast (SINGULAIR) 10 MG tablet, SMARTSIG:1 Tablet(s) By Mouth Every Evening, Disp: , Rfl:    nitroGLYCERIN (NITROSTAT) 0.4 MG SL tablet, ONE TABLET UNDER TONGUE AS NEEDED FOR CHEST PAIN AS DIRECTED, Disp: , Rfl:    olopatadine (PATANOL) 0.1 % ophthalmic solution, , Disp: , Rfl:    omeprazole (PRILOSEC) 20 MG capsule, Take 20 mg by mouth in the morning and at bedtime., Disp: , Rfl:    omeprazole (PRILOSEC) 40 MG capsule, Take 40 mg by mouth 2 (two) times daily., Disp: , Rfl:    oxyCODONE-acetaminophen (PERCOCET) 10-325 MG tablet, Take 1 tablet by mouth every 4 (four) hours as needed for pain., Disp: , Rfl:    paliperidone (INVEGA) 6 MG 24 hr tablet, Take 6 mg by mouth at bedtime., Disp: , Rfl:    predniSONE (DELTASONE) 20 MG tablet, Take 20 mg by mouth 2 (two) times daily., Disp: , Rfl:    SUMAtriptan (IMITREX) 50 MG tablet, sumatriptan 50 mg tablet, Disp: , Rfl:    topiramate (TOPAMAX) 100 MG tablet, , Disp: , Rfl:    topiramate (TOPAMAX) 50 MG  tablet, Take 50 mg by mouth at bedtime., Disp: , Rfl:    trazodone (DESYREL) 300 MG tablet, Take 300 mg by mouth  at bedtime., Disp: , Rfl:    TRELEGY ELLIPTA 100-62.5-25 MCG/ACT AEPB, Take 1 puff by mouth daily., Disp: , Rfl:    triamcinolone acetonide (KENALOG-40) 40 MG/ML injection, Inject into the articular space., Disp: , Rfl:   Social History   Tobacco Use  Smoking Status Former   Types: Cigarettes   Passive exposure: Never  Smokeless Tobacco Never  Tobacco Comments   "takes a puff sometimes"    Allergies  Allergen Reactions   Penicillins Anaphylaxis    Has patient had a PCN reaction causing immediate rash, facial/tongue/throat swelling, SOB or lightheadedness with hypotension: Yes Has patient had a PCN reaction causing severe rash involving mucus membranes or skin necrosis: Yes Has patient had a PCN reaction that required hospitalization Yes Has patient had a PCN reaction occurring within the last 10 years: No-more than 10 years ago If all of the above answers are "NO", then may proceed with Cephalosporin use.    Penicillins Anaphylaxis    DID THE REACTION INVOLVE: Swelling of the face/tongue/throat, SOB, or low BP? Yes Sudden or severe rash/hives, skin peeling, or the inside of the mouth or nose? Yes Did it require medical treatment? No When did it last happen? Within the past 10 years If all above answers are "NO", may proceed with cephalosporin use.     Asa [Aspirin] Rash   Aspirin Rash   Objective:  There were no vitals filed for this visit. There is no height or weight on file to calculate BMI. Constitutional Well developed. Well nourished.  Vascular Dorsalis pedis pulses palpable bilaterally. Posterior tibial pulses palpable bilaterally. Capillary refill normal to all digits.  No cyanosis or clubbing noted. Pedal hair growth normal.  Neurologic Normal speech. Oriented to person, place, and time. Epicritic sensation to light touch grossly present  bilaterally.  Dermatologic Nails well groomed and normal in appearance. No open wounds. No skin lesions.  Orthopedic: Pain on palpation to the fifth metatarsal base consider consistent with underlying exostosis.  No pain at the Lisfranc interval.  No pain along the course of the peroneal tendon.  Right plantarflexed fifth metatarsal noted painful in nature.  Submetatarsal 5 lesion noted painful to touch.   Radiographs: 3 views of skeletally mature bilateral foot: Plantarflexed metatarsal noted to the right side and fifth met base exostosis noted.  Previous hardware noted to the left side as well as right first MPJ joint.  No other bony medical maladies noted.  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 Assessment:   1. Plantar flexed metatarsal, right   2. Bony exostosis   3. Encounter for preoperative examination for general surgical procedure    Plan:  Patient was evaluated and treated and all questions answered.  Right plantarflexed fifth metatarsal and left fifth met base exostosis -All of the concerns were discussed with the patient in extensive detail.  Given the amount of pain that she is having she will benefit from surgical intervention where I will plan on doing a right fifth floating osteotomy as well as left fifth exostectomy of the metatarsal base.  I discussed this with patient she states understanding would like to proceed with surgery she has failed all conservative treatment options at this time including padding protecting shoe gear modification and debridement of the callus.  I discussed my preoperative intra postop plan in extensive detail she states understand like to proceed with surgery.  She will be weightbearing as tolerated bilaterally -Informed surgical risk consent was reviewed and read aloud to the  patient.  I reviewed the films.  I have discussed my findings with the patient in great detail.  I have discussed all risks including but not limited to infection,  stiffness, scarring, limp, disability, deformity, damage to blood vessels and nerves, numbness, poor healing, need for braces, arthritis, chronic pain, amputation, death.  All benefits and realistic expectations discussed in great detail.  I have made no promises as to the outcome.  I have provided realistic expectations.  I have offered the patient a 2nd opinion, which they have declined and assured me they preferred to proceed despite the risks   No follow-ups on file.

## 2022-04-20 ENCOUNTER — Telehealth: Payer: Self-pay

## 2022-04-20 NOTE — Telephone Encounter (Signed)
Called pt left message that her paperwork is ready for pick and we have tried several times to fax her paper to [facility]. Called housing Berkley Harvey got her direct fax at [phone] and email [email or web address]  Nothing further

## 2022-04-22 ENCOUNTER — Other Ambulatory Visit: Payer: Self-pay | Admitting: Pulmonary Disease

## 2022-04-22 ENCOUNTER — Other Ambulatory Visit (HOSPITAL_COMMUNITY): Payer: Self-pay

## 2022-04-22 DIAGNOSIS — J4551 Severe persistent asthma with (acute) exacerbation: Secondary | ICD-10-CM

## 2022-04-23 ENCOUNTER — Other Ambulatory Visit (HOSPITAL_COMMUNITY): Payer: Self-pay

## 2022-04-23 ENCOUNTER — Telehealth: Payer: Self-pay | Admitting: Urology

## 2022-04-23 MED ORDER — NUCALA 100 MG/ML ~~LOC~~ SOAJ
100.0000 mg | SUBCUTANEOUS | 0 refills | Status: DC
  Filled 2022-04-23 – 2022-04-24 (×2): qty 1, 28d supply, fill #0

## 2022-04-23 NOTE — Telephone Encounter (Signed)
Refill sent for [facility] to [facility]: [phone]   Dose: 100 mg SQ every 4 weeks  Last OV: 11/27/21 Provider: Dr. Everardo All  Next OV: 04/28/22  Chesley Mires, PharmD, MPH, BCPS Clinical Pharmacist (Rheumatology and Pulmonology)

## 2022-04-23 NOTE — Telephone Encounter (Signed)
DOS - 05/18/22   METATARSAL OSTEOTOMY 5TH RIGHT --- 50354 TARSAL EXOSTECTOMY RIGHT --- 28104   [facility] EFFECTIVE DATE - 03/24/22   PLAN DEDUCTIBLE - $0.00 OUT OF POCKET - $8,300.00 W/ $6,568.12 REMAINING COINSURANCE - 20% COPAY - $0.00     PER [employer] WEBSITE FOR CPT CODES 75170 AND [phone] Notification or Prior Authorization is not required for the requested services   Decision ID #:[id number]

## 2022-04-24 ENCOUNTER — Other Ambulatory Visit (HOSPITAL_COMMUNITY): Payer: Self-pay

## 2022-04-25 ENCOUNTER — Other Ambulatory Visit (HOSPITAL_COMMUNITY): Payer: Self-pay

## 2022-04-28 ENCOUNTER — Encounter: Payer: Self-pay | Admitting: Pulmonary Disease

## 2022-04-28 ENCOUNTER — Ambulatory Visit (INDEPENDENT_AMBULATORY_CARE_PROVIDER_SITE_OTHER): Payer: Medicare Other | Admitting: Pulmonary Disease

## 2022-04-28 VITALS — BP 110/80 | HR 93 | Temp 98.2°F | Ht 67.0 in | Wt 241.8 lb

## 2022-04-28 DIAGNOSIS — J4551 Severe persistent asthma with (acute) exacerbation: Secondary | ICD-10-CM

## 2022-04-28 MED ORDER — MONTELUKAST SODIUM 10 MG PO TABS
10.0000 mg | ORAL_TABLET | Freq: Every day | ORAL | 3 refills | Status: DC
Start: 2022-04-28 — End: 2022-10-23

## 2022-04-28 NOTE — Progress Notes (Signed)
Subjective:   PATIENT ID: Amanda [name] GENDER: female DOB: [birth year], MRN: [id number]   HPI  Chief Complaint  Patient presents with   Follow-up    Reason for Visit: Follow-up for asthma  [name] is a 56 year old female former smoker with asthma/COPD, hypertension, DM2, GERD/PUD history, hx cardiac arrest and chronic back pain who presents for asthma. Referred by PA Alphonzo Lemmings from [place] [address]  Synopsis: She reports since November 2022 with persistent shortness of breath with minimal exertion. Has chronic cough for months at this point. Associated wheezing. She was seen by [facility] and had spirometry completed. She uses albuterol HFA three times a day. Uses nebulizer four times a day. Has nighttime symptoms. Previously tried Trelegy with partial improvement. Denies ever having COVID  PFT 09/02/21 FEV1 54%. No ratio listed Myocardial scan 09/18/21 - Abnormal (Moderate inferolateral and inferior perfusion defect) with low risk of short term to intermediate term CV events. Normal symptomatic and EKG response to stress perfusion.  11/27/21 Since our last visit she was treated for asthma exacerbation and started on Advair HFA 230 strength. Inhaler has partially improved however activity including walking up stairs is tiring and will be shortness of breath, wheezing and coughing. She will use her rescue inhaler twice a day. She has postponed her left open carpal tunnel release due to symptoms. She feels short of breath with short distances.   04/28/22 Since our last visit she reports improved asthma symptoms. She is compliant with her Advair. Rarely uses her Albuterol inhaler. Started Nucala 12/11/21. Has shortness of breath with exertion. Minimal cough and wheezing.  Since our last visit she has not needed steroids for exacerbations.  2023 Jan Feb Mar April May June July Aug Sept Oct Nov Dec     X X  O          2024 Jan Feb Mar April May June July Aug Sept Oct  Nov Dec                2025 Jan Feb Mar April May June July Aug Sept Oct Nov Dec                O = Nucala started       No data to display         Social History: Social smoker Chef x 30 years  Past Medical History:  Diagnosis Date   Anxiety    Arthritis    lower back   Asthma    Bipolar disorder ([facility])    Carpal tunnel syndrome, bilateral    Chronic back pain    Depression    Diverticulosis    Fx. left wrist    GERD (gastroesophageal reflux disease)    Heart murmur    "born with"   Heart murmur    HLD (hyperlipidemia)    Hypertension    IBS (irritable bowel syndrome)    Internal hemorrhoids    Muscle spasms of neck    Neuropathy    PUD (peptic ulcer disease)      Family History  Problem Relation Age of Onset   Breast cancer Paternal Grandmother    CAD Father    Diabetes Mellitus II Father    Heart failure Father    Diabetes Father    Pancreatic cancer Mother    HIV/AIDS Brother    Other Sister        Brain tumor   Colon cancer Neg Hx  Social History   Occupational History   Occupation: Cook    Comment: [employer]  Tobacco Use   Smoking status: Former    Types: Cigarettes    Passive exposure: Never   Smokeless tobacco: Never   Tobacco comments:    Has taking a few puffs of cigarettes in the past. 04/28/2022 Dellis Filbert  Vaping Use   Vaping Use: Never used  Substance and Sexual Activity   Alcohol use: Not Currently    Comment: heavily daily   Drug use: Not Currently    Types: Cocaine, "Crack" cocaine, Marijuana    Comment: last used two years ago   Sexual activity: Not Currently    Partners: Female    Allergies  Allergen Reactions   Penicillins Anaphylaxis    Has patient had a PCN reaction causing immediate rash, facial/tongue/throat swelling, SOB or lightheadedness with hypotension: Yes Has patient had a PCN reaction causing severe rash involving mucus membranes or skin necrosis: Yes Has patient had a PCN reaction that required  hospitalization Yes Has patient had a PCN reaction occurring within the last 10 years: No-more than 10 years ago If all of the above answers are "NO", then may proceed with Cephalosporin use.    Penicillins Anaphylaxis    DID THE REACTION INVOLVE: Swelling of the face/tongue/throat, SOB, or low BP? Yes Sudden or severe rash/hives, skin peeling, or the inside of the mouth or nose? Yes Did it require medical treatment? No When did it last happen? Within the past 10 years If all above answers are "NO", may proceed with cephalosporin use.     Asa [Aspirin] Rash   Aspirin Rash     Outpatient Medications Prior to Visit  Medication Sig Dispense Refill   fluticasone-salmeterol (ADVAIR HFA) 230-21 MCG/ACT inhaler Inhale 2 puffs into the lungs 2 (two) times daily. 1 each 12   ipratropium (ATROVENT) 0.03 % nasal spray SMARTSIG:1 Spray(s) Both Nares Twice Daily PRN     montelukast (SINGULAIR) 10 MG tablet SMARTSIG:1 Tablet(s) By Mouth Every Evening     albuterol (VENTOLIN HFA) 108 (90 Base) MCG/ACT inhaler Inhale 2 puffs into the lungs every 4 (four) hours as needed for wheezing or shortness of breath. (Patient not taking: Reported on 04/28/2022)     ALPRAZolam (XANAX) 0.25 MG tablet TAKE 1 2 TO 1 (ONE HALF TO ONE) TABLET BY MOUTH TWICE DAILY     ALPRAZolam (XANAX) 0.5 MG tablet Take 0.5 mg by mouth daily as needed.     amLODipine (NORVASC) 5 MG tablet Take 1 tablet (5 mg total) by mouth daily. 30 tablet 6   atorvastatin (LIPITOR) 20 MG tablet Take 20 mg by mouth daily.     atorvastatin (LIPITOR) 40 MG tablet      BELBUCA 150 MCG FILM Take 1 strip by mouth 2 (two) times daily.     BELBUCA 300 MCG FILM Take 1 strip by mouth 2 (two) times daily.     buprenorphine (BUTRANS) 10 MCG/HR PTWK 1 patch once a week.     buPROPion (WELLBUTRIN SR) 150 MG 12 hr tablet Take 150 mg by mouth 2 (two) times daily.     cetirizine (ZYRTEC) 10 MG tablet Take 10 mg by mouth daily.     cloNIDine (CATAPRES) 0.1 MG tablet       cyclobenzaprine (FLEXERIL) 10 MG tablet      dexlansoprazole (DEXILANT) 60 MG capsule      diltiazem (CARDIZEM CD) 240 MG 24 hr capsule Take 240 mg by mouth daily.  divalproex (DEPAKOTE) 500 MG DR tablet      doxycycline (VIBRA-TABS) 100 MG tablet Take 1 tablet by mouth 2 (two) times daily.     famotidine (PEPCID) 20 MG tablet Take 20 mg by mouth 2 (two) times daily.     famotidine (PEPCID) 20 MG tablet Take by mouth.     FARXIGA 10 MG TABS tablet Take 10 mg by mouth daily.     fluticasone (FLONASE) 50 MCG/ACT nasal spray      gabapentin (NEURONTIN) 100 MG capsule Take 100 mg by mouth 3 (three) times daily.     gabapentin (NEURONTIN) 800 MG tablet      haloperidol (HALDOL) 10 MG tablet Take 10 mg by mouth 2 (two) times daily.     haloperidol (HALDOL) 5 MG tablet      hydrochlorothiazide (HYDRODIURIL) 25 MG tablet hydrochlorothiazide 25 mg tablet     hydrOXYzine (ATARAX) 50 MG tablet Take 1 tablet by mouth 4 (four) times daily.     hydrOXYzine (ATARAX/VISTARIL) 50 MG tablet Take 50 mg by mouth 3 (three) times daily as needed.     isosorbide mononitrate (IMDUR) 120 MG 24 hr tablet Take 120 mg by mouth daily.     Lacosamide 100 MG TABS Take by mouth.     levofloxacin (LEVAQUIN) 750 MG tablet      lidocaine, PF, (XYLOCAINE) 1 % SOLN injection by Infiltration route.     LINZESS 145 MCG CAPS capsule Take 145 mcg by mouth daily.     meloxicam (MOBIC) 7.5 MG tablet Take 1 tablet by mouth daily.     Mepolizumab (NUCALA) 100 MG/ML SOAJ Inject 1 mL (100 mg total) into the skin every 28 (twenty-eight) days. 1 mL 0   methylPREDNISolone (MEDROL) 8 MG tablet Take by mouth.     metoprolol tartrate (LOPRESSOR) 50 MG tablet Take 50 mg by mouth 2 (two) times daily.     Milnacipran HCl (SAVELLA) 100 MG TABS tablet Take by mouth.     nitroGLYCERIN (NITROSTAT) 0.4 MG SL tablet ONE TABLET UNDER TONGUE AS NEEDED FOR CHEST PAIN AS DIRECTED     olopatadine (PATANOL) 0.1 % ophthalmic solution       omeprazole (PRILOSEC) 20 MG capsule Take 20 mg by mouth in the morning and at bedtime.     omeprazole (PRILOSEC) 40 MG capsule Take 40 mg by mouth 2 (two) times daily.     oxyCODONE-acetaminophen (PERCOCET) 10-325 MG tablet Take 1 tablet by mouth every 4 (four) hours as needed for pain.     paliperidone (INVEGA) 6 MG 24 hr tablet Take 6 mg by mouth at bedtime.     predniSONE (DELTASONE) 20 MG tablet Take 20 mg by mouth 2 (two) times daily. (Patient not taking: Reported on 04/28/2022)     SUMAtriptan (IMITREX) 50 MG tablet sumatriptan 50 mg tablet     topiramate (TOPAMAX) 100 MG tablet      topiramate (TOPAMAX) 50 MG tablet Take 50 mg by mouth at bedtime.     trazodone (DESYREL) 300 MG tablet Take 300 mg by mouth at bedtime.     TRELEGY ELLIPTA 100-62.5-25 MCG/ACT AEPB Take 1 puff by mouth daily. (Patient not taking: Reported on 04/28/2022)     triamcinolone acetonide (KENALOG-40) 40 MG/ML injection Inject into the articular space.     No facility-administered medications prior to visit.    Review of Systems  Constitutional:  Negative for chills, diaphoresis, fever, malaise/fatigue and weight loss.  HENT:  Negative for congestion.  Respiratory:  Positive for shortness of breath. Negative for cough, hemoptysis, sputum production and wheezing.   Cardiovascular:  Negative for chest pain, palpitations and leg swelling.     Objective:   Vitals:   04/28/22 1030  BP: 110/80  Pulse: 93  Temp: 98.2 F (36.8 C)  SpO2: 98%  Weight: 241 lb 12.8 oz (109.7 kg)  Height: 5\' 7"  (1.702 m)  SpO2: 98 %  Physical Exam: General: Well-appearing, no acute distress HENT: [place], AT Eyes: EOMI, no scleral icterus Respiratory: Clear to auscultation bilaterally.  No crackles, wheezing or rales Cardiovascular: RRR, -M/R/G, no JVD Extremities:-Edema,-tenderness Neuro: AAO x4, CNII-XII grossly intact Psych: Normal mood, normal affect  Data Reviewed:  Imaging: CTA 08/25/2018-no pulmonary embolism bilateral  groundglass opacities.  No parenchymal abnormalities  PFT: None on file  Labs: CBC    Component Value Date/Time   WBC 7.1 11/13/2021 1213   RBC 3.85 (L) 11/13/2021 1213   HGB 12.1 11/13/2021 1213   HGB 11.5 09/13/2018 1528   HCT 35.7 (L) 11/13/2021 1213   HCT 32.9 (L) 09/13/2018 1528   PLT 213.0 11/13/2021 1213   PLT 244 09/13/2018 1528   MCV 92.7 11/13/2021 1213   MCV 98 (H) 09/13/2018 1528   MCH 33.7 03/03/2021 1307   MCHC 33.8 11/13/2021 1213   RDW 12.5 11/13/2021 1213   RDW 12.8 09/13/2018 1528   LYMPHSABS 3.0 11/13/2021 1213   LYMPHSABS 2.2 09/13/2018 1528   MONOABS 0.8 11/13/2021 1213   EOSABS 0.4 11/13/2021 1213   EOSABS 0.2 09/13/2018 1528   BASOSABS 0.1 11/13/2021 1213   BASOSABS 0.1 09/13/2018 1528   Absolute eos  09/13/18 - 200 11/13/21 400    Assessment & Plan:   Discussion: 56 year old female former smoker with asthma/COPD, HTN, DM2, GERD/PUD hx, hx cardiac arrest and chronic back pain who presents for follow-up. Improved symptoms on biologics. Still some persistent symptoms.  Discussed clinical course and management of COPD/asthma including bronchodilator regimen and action plan for exacerbation. Patient prefers limiting prednisone use when able.  Severe persistent asthma - improved Asthma-COPD overlap Deconditioning --CONTINUE Nucala 11/2021> --CONTINUE Advair 230-21 mcg TWO puff TWICE a day --CONTINUE Albuterol TWO puffs AS NEEDED for shortness of breath --START montelukast 10 mg daily --START regular aerobic activity. Goal 10,000 steps a day  Health Maintenance Immunization History  Administered Date(s) Administered   Moderna SARS-COV2 Booster Vaccination 08/30/2020   Moderna Sars-Covid-2 Vaccination 11/02/2019, 12/05/2019   Tdap 12/15/2015    No orders of the defined types were placed in this encounter.  Meds ordered this encounter  Medications   montelukast (SINGULAIR) 10 MG tablet    Sig: Take 1 tablet (10 mg total) by mouth at bedtime.     Dispense:  90 tablet    Refill:  3    Return in about 3 months (around 07/28/2022).   I have spent a total time of 35-minutes on the day of the appointment including chart review, data review, collecting history, coordinating care and discussing medical diagnosis and plan with the patient/family. Past medical history, allergies, medications were reviewed. Pertinent imaging, labs and tests included in this note have been reviewed and interpreted independently by me.  Patriciann Becht 14/12/2021, MD [facility] 04/28/2022 10:57 AM  Office Number [phone]

## 2022-04-28 NOTE — Consult Note (Signed)
Gave patient a copy of housing paperwork, fax number to housing and the person the paperwork was faxed to.

## 2022-04-28 NOTE — Patient Instructions (Signed)
Severe persistent asthma - improved Asthma-COPD overlap Deconditioning --CONTINUE Nucala 11/2021> --CONTINUE Advair 230-21 mcg TWO puff TWICE a day --CONTINUE Albuterol TWO puffs AS NEEDED for shortness of breath --START montelukast 10 mg daily --START regular aerobic activity. Goal 10,000 steps a day  Follow-up with me in 4 months

## 2022-04-30 ENCOUNTER — Other Ambulatory Visit (HOSPITAL_COMMUNITY): Payer: Self-pay

## 2022-04-30 ENCOUNTER — Other Ambulatory Visit: Payer: Self-pay | Admitting: Pharmacist

## 2022-04-30 DIAGNOSIS — J4551 Severe persistent asthma with (acute) exacerbation: Secondary | ICD-10-CM

## 2022-04-30 MED ORDER — NUCALA 100 MG/ML ~~LOC~~ SOAJ
100.0000 mg | SUBCUTANEOUS | 5 refills | Status: DC
  Filled 2022-04-30 – 2022-05-25 (×3): qty 1, 28d supply, fill #0
  Filled 2022-06-18: qty 1, 28d supply, fill #1
  Filled 2022-07-17: qty 1, 28d supply, fill #2
  Filled 2022-08-11: qty 1, 28d supply, fill #3
  Filled 2022-09-07: qty 1, 28d supply, fill #4
  Filled 2022-10-06: qty 1, 28d supply, fill #5

## 2022-04-30 NOTE — Telephone Encounter (Signed)
Refill sent for [facility] to [facility]: [phone]   Dose: 100 mg SQ every 4 weeks  Last OV: 04/28/22 Provider: Dr. Everardo All   Next OV: 3 months (not yet scheduled)  Chesley Mires, PharmD, MPH, BCPS Clinical Pharmacist (Rheumatology and Pulmonology)

## 2022-05-01 ENCOUNTER — Other Ambulatory Visit (HOSPITAL_COMMUNITY): Payer: Self-pay

## 2022-05-11 ENCOUNTER — Telehealth: Payer: Self-pay

## 2022-05-11 NOTE — Telephone Encounter (Addendum)
Received notification from  [facility]  regarding a prior authorization for [place].  PA has been APPROVED through [employer] for [employer] Part D.   PA Approved 05-11-2022 through 08-23-2022   Key: [id number] PA Case ID: [id number]  Medication covered through 05-11-2022 through 08-23-2022

## 2022-05-13 ENCOUNTER — Telehealth: Payer: Self-pay

## 2022-05-13 NOTE — Telephone Encounter (Signed)
Amanda [name] called to cancel her surgery with Dr. Posey Pronto on 05/18/2022. She stated she has nobody to drive her home after surgery. She requested an appointment to see Dr. Posey Pronto to discuss some things with him. She is scheduled for 9/25/203

## 2022-05-20 ENCOUNTER — Other Ambulatory Visit: Payer: Self-pay | Admitting: Family Medicine

## 2022-05-20 DIAGNOSIS — Z1231 Encounter for screening mammogram for malignant neoplasm of breast: Secondary | ICD-10-CM

## 2022-05-22 ENCOUNTER — Other Ambulatory Visit (HOSPITAL_COMMUNITY): Payer: Self-pay

## 2022-05-22 ENCOUNTER — Ambulatory Visit (INDEPENDENT_AMBULATORY_CARE_PROVIDER_SITE_OTHER): Payer: Medicare Other | Admitting: Podiatry

## 2022-05-22 DIAGNOSIS — L989 Disorder of the skin and subcutaneous tissue, unspecified: Secondary | ICD-10-CM

## 2022-05-22 DIAGNOSIS — Z01818 Encounter for other preprocedural examination: Secondary | ICD-10-CM | POA: Diagnosis not present

## 2022-05-22 NOTE — Progress Notes (Signed)
Subjective:  Patient ID: Amanda [name], female    DOB: 1966/01/21,  MRN: [id number]  Chief Complaint  Patient presents with   Foot Pain    56 y.o. female presents with the above complaint.  Patient presents with right plantar midfoot benign skin lesion.  Patient states pain for touch is progressive gotten worse worse with ambulation worse with pressure.  She is trialed conservative care including offloading shoe gear modification debridement none of which has helped she would like to have it surgically excised.  She denies any other acute complaints.  Pain scale 7 out of 10 hurts with ambulation worse with pressure   Review of Systems: Negative except as noted in the HPI. Denies N/V/F/Ch.  Past Medical History:  Diagnosis Date   Anxiety    Arthritis    lower back   Asthma    Bipolar disorder ([place])    Carpal tunnel syndrome, bilateral    Chronic back pain    Depression    Diverticulosis    Fx. left wrist    GERD (gastroesophageal reflux disease)    Heart murmur    "born with"   Heart murmur    HLD (hyperlipidemia)    Hypertension    IBS (irritable bowel syndrome)    Internal hemorrhoids    Muscle spasms of neck    Neuropathy    PUD (peptic ulcer disease)     Current Outpatient Medications:    albuterol (VENTOLIN HFA) 108 (90 Base) MCG/ACT inhaler, Inhale 2 puffs into the lungs every 4 (four) hours as needed for wheezing or shortness of breath. (Patient not taking: Reported on 04/28/2022), Disp: , Rfl:    ALPRAZolam (XANAX) 0.25 MG tablet, TAKE 1 2 TO 1 (ONE HALF TO ONE) TABLET BY MOUTH TWICE DAILY, Disp: , Rfl:    ALPRAZolam (XANAX) 0.5 MG tablet, Take 0.5 mg by mouth daily as needed., Disp: , Rfl:    amLODipine (NORVASC) 5 MG tablet, Take 1 tablet (5 mg total) by mouth daily., Disp: 30 tablet, Rfl: 6   atorvastatin (LIPITOR) 20 MG tablet, Take 20 mg by mouth daily., Disp: , Rfl:    atorvastatin (LIPITOR) 40 MG tablet, , Disp: , Rfl:    BELBUCA 150 MCG FILM, Take 1 strip  by mouth 2 (two) times daily., Disp: , Rfl:    BELBUCA 300 MCG FILM, Take 1 strip by mouth 2 (two) times daily., Disp: , Rfl:    buprenorphine (BUTRANS) 10 MCG/HR PTWK, 1 patch once a week., Disp: , Rfl:    buPROPion (WELLBUTRIN SR) 150 MG 12 hr tablet, Take 150 mg by mouth 2 (two) times daily., Disp: , Rfl:    cetirizine (ZYRTEC) 10 MG tablet, Take 10 mg by mouth daily., Disp: , Rfl:    cloNIDine (CATAPRES) 0.1 MG tablet, , Disp: , Rfl:    cyclobenzaprine (FLEXERIL) 10 MG tablet, , Disp: , Rfl:    dexlansoprazole (DEXILANT) 60 MG capsule, , Disp: , Rfl:    diltiazem (CARDIZEM CD) 240 MG 24 hr capsule, Take 240 mg by mouth daily., Disp: , Rfl:    divalproex (DEPAKOTE) 500 MG DR tablet, , Disp: , Rfl:    doxycycline (VIBRA-TABS) 100 MG tablet, Take 1 tablet by mouth 2 (two) times daily., Disp: , Rfl:    famotidine (PEPCID) 20 MG tablet, Take 20 mg by mouth 2 (two) times daily., Disp: , Rfl:    famotidine (PEPCID) 20 MG tablet, Take by mouth., Disp: , Rfl:    FARXIGA 10  MG TABS tablet, Take 10 mg by mouth daily., Disp: , Rfl:    fluticasone (FLONASE) 50 MCG/ACT nasal spray, , Disp: , Rfl:    fluticasone-salmeterol (ADVAIR HFA) 230-21 MCG/ACT inhaler, Inhale 2 puffs into the lungs 2 (two) times daily., Disp: 1 each, Rfl: 12   gabapentin (NEURONTIN) 100 MG capsule, Take 100 mg by mouth 3 (three) times daily., Disp: , Rfl:    gabapentin (NEURONTIN) 800 MG tablet, , Disp: , Rfl:    haloperidol (HALDOL) 10 MG tablet, Take 10 mg by mouth 2 (two) times daily., Disp: , Rfl:    haloperidol (HALDOL) 5 MG tablet, , Disp: , Rfl:    hydrochlorothiazide (HYDRODIURIL) 25 MG tablet, hydrochlorothiazide 25 mg tablet, Disp: , Rfl:    hydrOXYzine (ATARAX) 50 MG tablet, Take 1 tablet by mouth 4 (four) times daily., Disp: , Rfl:    hydrOXYzine (ATARAX/VISTARIL) 50 MG tablet, Take 50 mg by mouth 3 (three) times daily as needed., Disp: , Rfl:    ipratropium (ATROVENT) 0.03 % nasal spray, SMARTSIG:1 Spray(s) Both Nares  Twice Daily PRN, Disp: , Rfl:    isosorbide mononitrate (IMDUR) 120 MG 24 hr tablet, Take 120 mg by mouth daily., Disp: , Rfl:    Lacosamide 100 MG TABS, Take by mouth., Disp: , Rfl:    levofloxacin (LEVAQUIN) 750 MG tablet, , Disp: , Rfl:    lidocaine, PF, (XYLOCAINE) 1 % SOLN injection, by Infiltration route., Disp: , Rfl:    LINZESS 145 MCG CAPS capsule, Take 145 mcg by mouth daily., Disp: , Rfl:    meloxicam (MOBIC) 7.5 MG tablet, Take 1 tablet by mouth daily., Disp: , Rfl:    Mepolizumab (NUCALA) 100 MG/ML SOAJ, Inject 1 mL (100 mg total) into the skin every 28 (twenty-eight) days., Disp: 1 mL, Rfl: 5   metoprolol tartrate (LOPRESSOR) 50 MG tablet, Take 50 mg by mouth 2 (two) times daily., Disp: , Rfl:    Milnacipran HCl (SAVELLA) 100 MG TABS tablet, Take by mouth., Disp: , Rfl:    montelukast (SINGULAIR) 10 MG tablet, Take 1 tablet (10 mg total) by mouth at bedtime., Disp: 90 tablet, Rfl: 3   nitroGLYCERIN (NITROSTAT) 0.4 MG SL tablet, ONE TABLET UNDER TONGUE AS NEEDED FOR CHEST PAIN AS DIRECTED, Disp: , Rfl:    olopatadine (PATANOL) 0.1 % ophthalmic solution, , Disp: , Rfl:    omeprazole (PRILOSEC) 20 MG capsule, Take 20 mg by mouth in the morning and at bedtime., Disp: , Rfl:    omeprazole (PRILOSEC) 40 MG capsule, Take 40 mg by mouth 2 (two) times daily., Disp: , Rfl:    oxyCODONE-acetaminophen (PERCOCET) 10-325 MG tablet, Take 1 tablet by mouth every 4 (four) hours as needed for pain., Disp: , Rfl:    paliperidone (INVEGA) 6 MG 24 hr tablet, Take 6 mg by mouth at bedtime., Disp: , Rfl:    SUMAtriptan (IMITREX) 50 MG tablet, sumatriptan 50 mg tablet, Disp: , Rfl:    topiramate (TOPAMAX) 100 MG tablet, , Disp: , Rfl:    topiramate (TOPAMAX) 50 MG tablet, Take 50 mg by mouth at bedtime., Disp: , Rfl:    trazodone (DESYREL) 300 MG tablet, Take 300 mg by mouth at bedtime., Disp: , Rfl:    triamcinolone acetonide (KENALOG-40) 40 MG/ML injection, Inject into the articular space., Disp: , Rfl:    Social History   Tobacco Use  Smoking Status Former   Types: Cigarettes   Passive exposure: Never  Smokeless Tobacco Never  Tobacco Comments  Has taking a few puffs of cigarettes in the past. 04/28/2022 Patty Sermons    Allergies  Allergen Reactions   Penicillins Anaphylaxis    Has patient had a PCN reaction causing immediate rash, facial/tongue/throat swelling, SOB or lightheadedness with hypotension: Yes Has patient had a PCN reaction causing severe rash involving mucus membranes or skin necrosis: Yes Has patient had a PCN reaction that required hospitalization Yes Has patient had a PCN reaction occurring within the last 10 years: No-more than 10 years ago If all of the above answers are "NO", then may proceed with Cephalosporin use.    Penicillins Anaphylaxis    DID THE REACTION INVOLVE: Swelling of the face/tongue/throat, SOB, or low BP? Yes Sudden or severe rash/hives, skin peeling, or the inside of the mouth or nose? Yes Did it require medical treatment? No When did it last happen? Within the past 10 years If all above answers are "NO", may proceed with cephalosporin use.     Asa [Aspirin] Rash   Aspirin Rash   Objective:  There were no vitals filed for this visit. There is no height or weight on file to calculate BMI. Constitutional Well developed. Well nourished.  Vascular Dorsalis pedis pulses palpable bilaterally. Posterior tibial pulses palpable bilaterally. Capillary refill normal to all digits.  No cyanosis or clubbing noted. Pedal hair growth normal.  Neurologic Normal speech. Oriented to person, place, and time. Epicritic sensation to light touch grossly present bilaterally.  Dermatologic Hyperkeratotic lesion with central nucleated core noted to right s midfoot porokeratosis.  Pain on palpation to the lesion.  Orthopedic: Normal joint ROM without pain or crepitus bilaterally. No visible deformities. No bony tenderness.   Radiographs: None Assessment:   1.  Benign skin lesion   2. Encounter for preoperative examination for general surgical procedure    Plan:  Patient was evaluated and treated and all questions answered.  Right plantar midfoot porokeratosis/benign skin lesion -All the concerns were discussed with the patient in extensive detail -Given that patient has finished clinically failed all conservative treatment options she will benefit from surgical excision of the benign skin lesion.  I discussed my preoperative intra postop plan in extensive detail she will be nonweightbearing to the right lower extremity given the plantar incision.  She states understand like to proceed with surgery. -Informed surgical risk consent was reviewed and read aloud to the patient.  I reviewed the films.  I have discussed my findings with the patient in great detail.  I have discussed all risks including but not limited to infection, stiffness, scarring, limp, disability, deformity, damage to blood vessels and nerves, numbness, poor healing, need for braces, arthritis, chronic pain, amputation, death.  All benefits and realistic expectations discussed in great detail.  I have made no promises as to the outcome.  I have provided realistic expectations.  I have offered the patient a 2nd opinion, which they have declined and assured me they preferred to proceed despite the risks   No follow-ups on file.  Right plantar midfoot porokeratosis surgery

## 2022-05-25 ENCOUNTER — Other Ambulatory Visit (HOSPITAL_COMMUNITY): Payer: Self-pay

## 2022-05-26 ENCOUNTER — Other Ambulatory Visit (HOSPITAL_COMMUNITY): Payer: Self-pay

## 2022-05-27 ENCOUNTER — Encounter: Payer: Medicare Other | Admitting: Podiatry

## 2022-06-10 ENCOUNTER — Encounter: Payer: Medicare Other | Admitting: Podiatry

## 2022-06-17 ENCOUNTER — Telehealth: Payer: Self-pay | Admitting: Pulmonary Disease

## 2022-06-17 NOTE — Telephone Encounter (Signed)
On 10/20 Dr. Loanne Drilling asked me to find out if the signed Request for Reasonable Accommodation form that she signed on 03/30/22 needed to be faxed again.  On 10/23 I left a voice message for the patient and for the HUD representative.  HUD rep, Tally Due, called me back and asked that I email the form to her and she will follow-up with the patient.  Emailed signed form to [email or web address] and to patient at [email or web address]

## 2022-06-18 ENCOUNTER — Other Ambulatory Visit (HOSPITAL_COMMUNITY): Payer: Self-pay

## 2022-06-22 ENCOUNTER — Other Ambulatory Visit (HOSPITAL_COMMUNITY): Payer: Self-pay

## 2022-06-25 ENCOUNTER — Ambulatory Visit: Payer: Medicare Other

## 2022-07-13 ENCOUNTER — Telehealth: Payer: Self-pay | Admitting: Podiatry

## 2022-07-13 NOTE — Telephone Encounter (Signed)
DOS: 08/10/2022  [employer] Medicare Effective 03/24/2022 Medicaid [place] Access  Excision Benign Lesion Over 4.0 cm Rt (11426)  DX: L98.9  Deductible: $0 Out-of-Pocket: $8,300 with $6,441.93 remaining CoInsurance: 20%  Prior authorization is not required per [employer].  Decision ID #:[id number]

## 2022-07-14 ENCOUNTER — Other Ambulatory Visit (HOSPITAL_COMMUNITY): Payer: Self-pay

## 2022-07-17 ENCOUNTER — Other Ambulatory Visit (HOSPITAL_COMMUNITY): Payer: Self-pay

## 2022-07-18 ENCOUNTER — Other Ambulatory Visit (HOSPITAL_COMMUNITY): Payer: Self-pay

## 2022-07-20 ENCOUNTER — Other Ambulatory Visit (HOSPITAL_COMMUNITY): Payer: Self-pay

## 2022-07-23 ENCOUNTER — Telehealth: Payer: Self-pay

## 2022-07-23 NOTE — Telephone Encounter (Signed)
Amanda [name] called to cancel her surgery with Dr. Allena Katz on 08/10/2022. She stated she doesn't want to have surgery right now. Notified Dr. Allena Katz and Aram Beecham with [facility].

## 2022-07-29 ENCOUNTER — Other Ambulatory Visit: Payer: Self-pay | Admitting: Nephrology

## 2022-07-29 DIAGNOSIS — N182 Chronic kidney disease, stage 2 (mild): Secondary | ICD-10-CM

## 2022-07-29 DIAGNOSIS — E559 Vitamin D deficiency, unspecified: Secondary | ICD-10-CM

## 2022-07-29 DIAGNOSIS — I129 Hypertensive chronic kidney disease with stage 1 through stage 4 chronic kidney disease, or unspecified chronic kidney disease: Secondary | ICD-10-CM

## 2022-08-03 ENCOUNTER — Ambulatory Visit (HOSPITAL_BASED_OUTPATIENT_CLINIC_OR_DEPARTMENT_OTHER): Payer: Medicare Other | Admitting: Pulmonary Disease

## 2022-08-07 ENCOUNTER — Telehealth: Payer: Self-pay | Admitting: Pharmacist

## 2022-08-07 NOTE — Telephone Encounter (Signed)
Per Rema Jasmine, PA for [employer] to expire. Submitted a Prior Authorization request to [facility] for NUCALA via CoverMyMeds. Will update once we receive a response.  Key: [id number]  Chesley Mires, PharmD, MPH, BCPS, CPP Clinical Pharmacist (Rheumatology and Pulmonology)

## 2022-08-09 NOTE — Telephone Encounter (Signed)
Received notification from [facility] regarding a prior authorization for NUCALA. Authorization has been APPROVED from 08/07/22 to 08/24/2023. Approval letter sent to scan center.  Patient must continue to fill through [facility]: [phone]   Authorization # [id number] Phone # [phone]  [employer] updated  Chesley Mires, PharmD, MPH, BCPS, CPP Clinical Pharmacist (Rheumatology and Pulmonology)

## 2022-08-11 ENCOUNTER — Ambulatory Visit: Payer: Medicare Other

## 2022-08-11 ENCOUNTER — Other Ambulatory Visit (HOSPITAL_COMMUNITY): Payer: Self-pay

## 2022-08-12 ENCOUNTER — Other Ambulatory Visit: Payer: Self-pay

## 2022-08-19 ENCOUNTER — Encounter: Payer: Medicare Other | Admitting: Podiatry

## 2022-08-25 ENCOUNTER — Ambulatory Visit: Payer: Medicare Other

## 2022-08-27 ENCOUNTER — Ambulatory Visit (HOSPITAL_BASED_OUTPATIENT_CLINIC_OR_DEPARTMENT_OTHER): Payer: Medicare Other | Admitting: Pulmonary Disease

## 2022-09-02 ENCOUNTER — Encounter: Payer: Medicare Other | Admitting: Podiatry

## 2022-09-03 ENCOUNTER — Encounter (HOSPITAL_COMMUNITY): Payer: Self-pay | Admitting: *Deleted

## 2022-09-03 ENCOUNTER — Ambulatory Visit (INDEPENDENT_AMBULATORY_CARE_PROVIDER_SITE_OTHER): Payer: Medicare Other

## 2022-09-03 ENCOUNTER — Ambulatory Visit (HOSPITAL_COMMUNITY)
Admission: EM | Admit: 2022-09-03 | Discharge: 2022-09-03 | Disposition: A | Discharge status: Home / Self Care | Payer: Medicare Other | Attending: Sports Medicine | Admitting: Sports Medicine

## 2022-09-03 DIAGNOSIS — R197 Diarrhea, unspecified: Secondary | ICD-10-CM

## 2022-09-03 DIAGNOSIS — M79661 Pain in right lower leg: Secondary | ICD-10-CM | POA: Diagnosis not present

## 2022-09-03 DIAGNOSIS — M25561 Pain in right knee: Secondary | ICD-10-CM | POA: Diagnosis present

## 2022-09-03 DIAGNOSIS — R110 Nausea: Secondary | ICD-10-CM | POA: Diagnosis present

## 2022-09-03 LAB — POCT URINALYSIS DIPSTICK, ED / UC
Bilirubin Urine: NEGATIVE
Glucose, UA: 500 mg/dL — AB
Hgb urine dipstick: NEGATIVE
Leukocytes,Ua: NEGATIVE
Nitrite: NEGATIVE
Protein, ur: >=300 mg/dL — AB
Specific Gravity, Urine: >=1.03 (ref 1.005–1.030)
Urobilinogen, UA: 0.2 mg/dL (ref 0.0–1.0)
pH: 6 (ref 5.0–8.0)

## 2022-09-03 LAB — C DIFFICILE QUICK SCREEN W PCR REFLEX
C Diff antigen: NEGATIVE
C Diff interpretation: NOT DETECTED
C Diff toxin: NEGATIVE

## 2022-09-03 MED ORDER — KETOROLAC TROMETHAMINE 30 MG/ML IJ SOLN
INTRAMUSCULAR | Status: AC
  Filled 2022-09-03: qty 1

## 2022-09-03 MED ORDER — ONDANSETRON 4 MG PO TBDP
ORAL_TABLET | ORAL | Status: AC
  Filled 2022-09-03: qty 1

## 2022-09-03 MED ORDER — ONDANSETRON HCL 4 MG PO TABS: 4.0000 mg | ORAL_TABLET | Freq: Three times a day (TID) | ORAL | 0 refills | Status: AC | PRN

## 2022-09-03 MED ORDER — ONDANSETRON 4 MG PO TBDP
4.0000 mg | ORAL_TABLET | Freq: Once | ORAL | Status: AC
  Administered 2022-09-03: 4 mg via ORAL

## 2022-09-03 MED ORDER — KETOROLAC TROMETHAMINE 30 MG/ML IJ SOLN
30.0000 mg | Freq: Once | INTRAMUSCULAR | Status: AC
  Administered 2022-09-03: 30 mg via INTRAMUSCULAR

## 2022-09-03 MED ORDER — DICYCLOMINE HCL 20 MG PO TABS: 20.0000 mg | ORAL_TABLET | Freq: Two times a day (BID) | ORAL | 0 refills | Status: DC

## 2022-09-03 NOTE — ED Notes (Signed)
Stool in lab

## 2022-09-03 NOTE — ED Triage Notes (Signed)
Pt states she had had lower right leg pain since thanksgiving and it is getting worse. Her leg has been giving out. She fell in the lobby. She states she is on pain meds but not taking anything in addition to those regular meds.    Pt states she has mid abdominal pain and diarrhea which is yellow and she has a dx of IBS. She states that she has had diarrhea x 1 month.   She also states she has extreme burning with she urinates.

## 2022-09-03 NOTE — ED Notes (Signed)
Patient in bathroom

## 2022-09-03 NOTE — Discharge Instructions (Addendum)
For your right knee pain the x-rays did not show any obvious fracture, the radiologist is concerned may be an old injury.  We gave you a Toradol shot for your pain and placed you in a hinged knee brace.  I have provided the information for the on-call orthopedics group.  If your pain continues I recommend following up with them in their clinic. For your stomach cramping nausea and diarrhea we gave you today some Zofran.  I have sent to your pharmacy of medicine for stomach cramps and the antinausea medicine we gave you here today.  I recommend you call your GI doctor to schedule the soonest appointment.  We have also tested you for bacterial infection.  Urgent care staff will call you with results and appropriate treatment if it is positive.   Follow-up with your primary care provider within the next week for the above problems especially if you have no improvement.

## 2022-09-03 NOTE — ED Provider Notes (Signed)
[facility]    CSN: [id number] Arrival date & time: 09/03/22  1029      History   Chief Complaint Chief Complaint  Patient presents with   Abdominal Pain   Nausea   Diarrhea   Extremity Pain    HPI Amanda [name] is a 57 y.o. female.   Patient is here today with 2 separate complaints one is her right lower leg pain the other is her diarrhea that is been off and on for the past couple of months.  For her right lower leg pain she reports she woke up around Thanksgiving and had this pain in her leg that has only been worsening.  She reports she saw her primary care provider who x-rayed her knee and said she had some arthritis and recommended she go to the ER.  She called the ER and told them her story and they recommended she come to the urgent care.  She usually takes pain medication for her chronic pain, Percocet, which she reports has not touched her lower leg pain, this only helps her back pain.  Her pain is causing her leg to buckle, this has happened twice now and she is concerned that she will injure herself further.  She was walking into our lobby this morning to check in when her leg gave out and she fell onto the floor.  She was able to be assisted into a wheelchair. For her diarrhea and stomach cramps she reports this has been off and on since Thanksgiving.  Patient has a history of IBS she tells me for which she is seen GI and taking medications, she is unable to recall medications she is taking.  For her diarrhea she has tried over-the-counter antidiarrheals and Pepto-Bismol which did not give her any relief.  She reports frequent stools that are yellow when she wipes and has had some intermittent blood in her stools.  She has multiple loose watery stools per day that she reports have a very foul odor to them.  She has only been able to eat very small meals since Thanksgiving.  She has a small appetite and nausea she does not able to look at food at this point.  She has  not called to speak with her GI doctor about this yet.  She denies any fevers, chills, sick contacts or recent travel.   Abdominal Pain Associated symptoms: diarrhea   Diarrhea Associated symptoms: abdominal pain   Extremity Pain Associated symptoms include abdominal pain.    Past Medical History:  Diagnosis Date   Anxiety    Arthritis    lower back   Asthma    Bipolar disorder (HCC)    Carpal tunnel syndrome, bilateral    Chronic back pain    Depression    Diverticulosis    Fx. left wrist    GERD (gastroesophageal reflux disease)    Heart murmur    "born with"   Heart murmur    HLD (hyperlipidemia)    Hypertension    IBS (irritable bowel syndrome)    Internal hemorrhoids    Muscle spasms of neck    Neuropathy    PUD (peptic ulcer disease)     Patient Active Problem List   Diagnosis Date Noted   Severe persistent asthma with acute exacerbation 11/27/2021   Abnormal laboratory test 09/12/2021   Chronic pain syndrome 09/05/2021   Bipolar I disorder, most recent episode depressed, severe w psychosis (HCC) 02/25/2021   Alcohol abuse 02/25/2021  Abnormal weight loss 12/19/2020   Change in bowel habit 12/19/2020   Colon cancer screening 12/19/2020   Diarrhea 12/19/2020   Dysphagia 12/19/2020   Epigastric pain 12/19/2020   Family history of malignant neoplasm of gastrointestinal tract 12/19/2020   Non-ST elevation (NSTEMI) myocardial infarction [facility])    Contusion of chest 08/26/2018   Cardiac arrest ([place]) 08/26/2018   Cocaine abuse ([place]) 08/26/2018   HLD (hyperlipidemia) 08/26/2018   Elevated LFTs 08/26/2018   History of migraine    Degenerative joint disease 07/26/2017   Morbid obesity ([place]) 12/24/2016   Hyperlipidemia 12/23/2016   Painful orthopaedic hardware ([place]) 03/24/2016   Corns and callosities 03/24/2016   PUD (peptic ulcer disease) 01/02/2016   Onychomycosis 01/02/2016   Seasonal allergies 11/19/2015   GERD (gastroesophageal reflux disease)  08/06/2015   Asthma 08/06/2015   Migraine 08/06/2015   Bipolar disorder ([place]) 07/25/2015   Neuropathy 07/08/2015   Major depressive disorder, recurrent, severe without psychotic features ([place])    Alcohol use disorder, severe, dependence ([place]) 11/16/2014   Cocaine use disorder, severe, dependence ([place]) 11/16/2014   Acute calculous cholecystitis s/p lap chole 02/09/2014 02/09/2014   HTN (hypertension) 07/25/2013   Carpal tunnel syndrome 11/18/2012   Lumbar radiculopathy 11/18/2012   Anxiety state, unspecified 11/18/2012    Past Surgical History:  Procedure Laterality Date   ARTHRODESIS METATARSALPHALANGEAL JOINT (MTPJ) Right 04/29/2016   Procedure: RIGHT GREAT TOE METATARSOPHALANGEAL JOINT (MTPJ) FUSION, HARDWARE REMOVAL;  Surgeon: Leandrew Koyanagi, MD;  Location: [place];  Service: Orthopedics;  Laterality: Right;  RIGHT GREAT TOE METATARSOPHALANGEAL JOINT (MTPJ) FUSION, HARDWARE REMOVAL   CARPAL TUNNEL RELEASE Right 2003   CHOLECYSTECTOMY N/A 02/09/2014   Procedure: LAPAROSCOPIC CHOLECYSTECTOMY WITH INTRAOPERATIVE CHOLANGIOGRAM;  Surgeon: Pedro Earls, MD;  Location: [facility];  Service: General;  Laterality: N/A;   CHOLECYSTECTOMY     FOOT SURGERY     HAMMER TOE FUSION Bilateral 2008   HARDWARE REMOVAL Right 04/29/2016   Procedure: HARDWARE REMOVAL;  Surgeon: Leandrew Koyanagi, MD;  Location: [place];  Service: Orthopedics;  Laterality: Right;  HARDWARE REMOVAL   LEFT HEART CATH AND CORONARY ANGIOGRAPHY N/A 08/30/2018   Procedure: LEFT HEART CATH AND CORONARY ANGIOGRAPHY;  Surgeon: Leonie Man, MD;  Location: [place] [facility];  Service: Cardiovascular;  Laterality: N/A;   ROTATOR CUFF REPAIR Right 2011   WRIST FRACTURE SURGERY Left     OB History     Gravida  0   Para  0   Term  0   Preterm  0   AB  0   Living  0      SAB  0   IAB  0   Ectopic  0   Multiple  0   Live Births  0            Home Medications    Prior to  Admission medications   Medication Sig Start Date End Date Taking? Authorizing Provider  ALPRAZolam Duanne Moron) 0.5 MG tablet Take 0.5 mg by mouth daily as needed. 04/02/21  Yes [provider]  amLODipine (NORVASC) 5 MG tablet Take 1 tablet (5 mg total) by mouth daily. 05/02/19  Yes Charlott Rakes, MD  atorvastatin (LIPITOR) 40 MG tablet    Yes [provider]  BELBUCA 300 MCG FILM Take 1 strip by mouth 2 (two) times daily. 03/01/22  Yes [provider]  buprenorphine (BUTRANS) 10 MCG/HR PTWK 1 patch once a week. 08/29/21  Yes [provider]  buPROPion (WELLBUTRIN SR) 150 MG 12 hr tablet Take 150 mg by mouth 2 (two) times daily. 04/02/21  Yes [provider]  cetirizine (ZYRTEC) 10 MG tablet Take 10 mg by mouth daily.   Yes [provider]  cyclobenzaprine (FLEXERIL) 10 MG tablet    Yes [provider]  dexlansoprazole (DEXILANT) 60 MG capsule    Yes [provider]  dicyclomine (BENTYL) 20 MG tablet Take 1 tablet (20 mg total) by mouth 2 (two) times daily. 09/03/22  Yes Gillermo Murdoch A, DO  diltiazem (CARDIZEM CD) 240 MG 24 hr capsule Take 240 mg by mouth daily. 09/25/21  Yes [provider]  divalproex (DEPAKOTE) 500 MG DR tablet    Yes [provider]  famotidine (PEPCID) 20 MG tablet Take 20 mg by mouth 2 (two) times daily.   Yes [provider]  FARXIGA 10 MG TABS tablet Take 10 mg by mouth daily. 03/11/22  Yes [provider]  fluticasone (FLONASE) 50 MCG/ACT nasal spray    Yes [provider]  fluticasone-salmeterol (ADVAIR HFA) 230-21 MCG/ACT inhaler Inhale 2 puffs into the lungs 2 (two) times daily. 12/11/21  Yes Luciano Cutter, MD  gabapentin (NEURONTIN) 100 MG capsule Take 100 mg by mouth 3 (three) times daily. 01/26/22  Yes [provider]  gabapentin (NEURONTIN) 800 MG tablet    Yes [provider]  hydrochlorothiazide (HYDRODIURIL) 25 MG tablet  hydrochlorothiazide 25 mg tablet   Yes [provider]  hydrOXYzine (ATARAX/VISTARIL) 50 MG tablet Take 50 mg by mouth 3 (three) times daily as needed. 04/02/21  Yes [provider]  ipratropium (ATROVENT) 0.03 % nasal spray SMARTSIG:1 Spray(s) Both Nares Twice Daily PRN 12/07/21  Yes [provider]  isosorbide mononitrate (IMDUR) 120 MG 24 hr tablet Take 120 mg by mouth daily.   Yes [provider]  Lacosamide 100 MG TABS Take by mouth. 09/18/21  Yes [provider]  LINZESS 145 MCG CAPS capsule Take 145 mcg by mouth daily. 11/18/21  Yes [provider]  meloxicam (MOBIC) 7.5 MG tablet Take 1 tablet by mouth daily. 03/18/21  Yes [provider]  Mepolizumab (NUCALA) 100 MG/ML SOAJ Inject 1 mL (100 mg total) into the skin every 28 (twenty-eight) days. 04/30/22  Yes Luciano Cutter, MD  metoprolol tartrate (LOPRESSOR) 50 MG tablet Take 50 mg by mouth 2 (two) times daily. 09/01/21  Yes [provider]  Milnacipran HCl (SAVELLA) 100 MG TABS tablet Take by mouth. 02/27/22  Yes [provider]  montelukast (SINGULAIR) 10 MG tablet Take 1 tablet (10 mg total) by mouth at bedtime. 04/28/22  Yes Luciano Cutter, MD  nitroGLYCERIN (NITROSTAT) 0.4 MG SL tablet ONE TABLET UNDER TONGUE AS NEEDED FOR CHEST PAIN AS DIRECTED 07/02/21  Yes [provider]  olopatadine (PATANOL) 0.1 % ophthalmic solution    Yes [provider]  ondansetron (ZOFRAN) 4 MG tablet Take 1 tablet (4 mg total) by mouth every 8 (eight) hours as needed for nausea or vomiting. 09/03/22  Yes Gillermo Murdoch A, DO  oxyCODONE-acetaminophen (PERCOCET) 10-325 MG tablet Take 1 tablet by mouth every 4 (four) hours as needed for pain.   Yes [provider]  paliperidone (INVEGA) 6 MG 24 hr tablet Take 6 mg by mouth at bedtime. 02/05/22  Yes [provider]  SUMAtriptan (IMITREX) 50 MG tablet sumatriptan 50 mg tablet   Yes [provider]  topiramate (TOPAMAX) 50 MG tablet Take 50 mg  by mouth at bedtime. 12/12/21  Yes [provider]  trazodone (DESYREL) 300 MG tablet Take 300 mg by mouth at bedtime. 04/02/21  Yes [provider]  albuterol (VENTOLIN HFA) 108 (90 Base) MCG/ACT inhaler Inhale 2 puffs into the lungs every 4 (four) hours as needed for wheezing or shortness of breath. Patient not taking: Reported on 04/28/2022 02/21/21   Estella Husk, MD  ALPRAZolam Prudy Feeler) 0.25 MG tablet TAKE 1 2 TO 1 (ONE HALF TO ONE) TABLET BY MOUTH TWICE DAILY    [provider]  atorvastatin (LIPITOR) 20 MG tablet Take 20 mg by mouth daily.    [provider]  BELBUCA 150 MCG FILM Take 1 strip by mouth 2 (two) times daily. 01/27/22   [provider]  cloNIDine (CATAPRES) 0.1 MG tablet     [provider]  doxycycline (VIBRA-TABS) 100 MG tablet Take 1 tablet by mouth 2 (two) times daily.    [provider]  famotidine (PEPCID) 20 MG tablet Take by mouth. 08/05/21   [provider]  haloperidol (HALDOL) 10 MG tablet Take 10 mg by mouth 2 (two) times daily. 04/02/21   [provider]  haloperidol (HALDOL) 5 MG tablet     [provider]  hydrOXYzine (ATARAX) 50 MG tablet Take 1 tablet by mouth 4 (four) times daily.    [provider]  levofloxacin (LEVAQUIN) 750 MG tablet     [provider]  lidocaine, PF, (XYLOCAINE) 1 % SOLN injection by Infiltration route. 12/17/21   [provider]  omeprazole (PRILOSEC) 20 MG capsule Take 20 mg by mouth in the morning and at bedtime.    [provider]  omeprazole (PRILOSEC) 40 MG capsule Take 40 mg by mouth 2 (two) times daily. 01/14/22   [provider]  topiramate (TOPAMAX) 100 MG tablet     [provider]  triamcinolone acetonide (KENALOG-40) 40 MG/ML injection Inject into the articular space. 12/17/21   [provider]  SYMBICORT 80-4.5 MCG/ACT inhaler  SMARTSIG:2 Puff(s) By Mouth Twice Daily 04/10/20 02/21/21  [provider]    Family History Family History  Problem Relation Age of Onset   Breast cancer Paternal Grandmother    CAD Father    Diabetes Mellitus II Father    Heart failure Father    Diabetes Father    Pancreatic cancer Mother    HIV/AIDS Brother    Other Sister        Brain tumor   Colon cancer Neg Hx     Social History Social History   Tobacco Use   Smoking status: Former    Types: Cigarettes    Passive exposure: Never   Smokeless tobacco: Never   Tobacco comments:    Has taking a few puffs of cigarettes in the past. 04/28/2022 Dellis Filbert  Vaping Use   Vaping Use: Never used  Substance Use Topics   Alcohol use: Yes    Comment: heavily daily   Drug use: Not Currently    Types: Cocaine, "Crack" cocaine, Marijuana    Comment: last used two years ago     Allergies   Penicillins, Penicillins, Asa [aspirin], and Aspirin   Review of Systems Review of Systems  Gastrointestinal:  Positive for abdominal pain and diarrhea.     Physical Exam Triage Vital Signs ED Triage Vitals  Enc Vitals Group     BP 09/03/22 1147 (!) 162/96     Pulse Rate 09/03/22 1147 (!) 107  Resp 09/03/22 1147 18     Temp 09/03/22 1147 98.4 F (36.9 C)     Temp Source 09/03/22 1147 Oral     SpO2 09/03/22 1147 95 %     Weight --      Height --      Head Circumference --      Peak Flow --      Pain Score 09/03/22 1144 10     Pain Loc --      Pain Edu? --      Excl. in GC? --    No data found.  Updated Vital Signs BP 139/87 (BP Location: Left Arm)   Pulse 89   Temp 98.6 F (37 C) (Oral)   Resp 18   LMP 07/29/2018 (Approximate)   SpO2 96%   Physical Exam Vitals reviewed.  Constitutional:      General: She is not in acute distress.    Appearance: She is well-developed. She is obese. She is not ill-appearing, toxic-appearing or diaphoretic.  Cardiovascular:     Rate and Rhythm: Normal rate.     Pulses: Normal  pulses.  Pulmonary:     Effort: Pulmonary effort is normal.  Abdominal:     General: There is no distension.     Palpations: Abdomen is soft.     Tenderness: There is abdominal tenderness (epigastric). There is no guarding.  Musculoskeletal:     Comments: Right knee: No obvious deformity or asymmetry.  No ecchymosis edema or effusion present today.  She has a lot of pain along with the medial joint line.  No tenderness to palpation along lateral joint line or anterior knee.  She has good range of motion flexion extension of the knee.  Skin:    General: Skin is warm.     Findings: No bruising or lesion.  Neurological:     Mental Status: She is alert.     Gait: Gait abnormal (Antalgic gait).  Psychiatric:        Mood and Affect: Mood is anxious.     Comments: Patient states she is frustrated with the situation and her pain      UC Treatments / Results  Labs (all labs ordered are listed, but only abnormal results are displayed) Labs Reviewed  POCT URINALYSIS DIPSTICK, ED / UC - Abnormal; Notable for the following components:      Result Value   Glucose, UA 500 (*)    Ketones, ur TRACE (*)    Protein, ur >=300 (*)    All other components within normal limits  C DIFFICILE QUICK SCREEN W PCR REFLEX      EKG   Radiology DG Tibia/Fibula Right  Result Date: 09/03/2022 CLINICAL DATA:  Right lower leg pain. EXAM: RIGHT TIBIA AND FIBULA - 2 VIEW COMPARISON:  None Available. FINDINGS: There is no evidence of fracture or other focal bone lesions. Soft tissues are unremarkable. IMPRESSION: Negative. Electronically Signed   By: Obie Dredge M.D.   On: 09/03/2022 12:32   DG Knee Complete 4 Views Right  Result Date: 09/03/2022 CLINICAL DATA:  Right knee pain and instability. EXAM: RIGHT KNEE - COMPLETE 4+ VIEW COMPARISON:  None Available. FINDINGS: No acute fracture or dislocation. No joint effusion. Joint spaces are preserved. Tiny tricompartmental marginal osteophytes. Bony prominence  of the posteromedial tibial plateau the suggestive of old injury. Bone mineralization is normal. Soft tissues are unremarkable. IMPRESSION: 1. Minimal tricompartmental degenerative changes. Electronically Signed   By: Vickki Hearing.D.  On: 09/03/2022 12:31    Procedures Procedures (including critical care time)  Medications Ordered in UC Medications  ketorolac (TORADOL) 30 MG/ML injection 30 mg (30 mg Intramuscular Given 09/03/22 1316)  ondansetron (ZOFRAN-ODT) disintegrating tablet 4 mg (4 mg Oral Given 09/03/22 1303)    Initial Impression / Assessment and Plan / UC Course  I have reviewed the triage vital signs and the nursing notes.  Pertinent labs & imaging results that were available during my care of the patient were reviewed by me and considered in my medical decision making (see chart for details).  Clinical Course as of 09/03/22 1328  Thu Sep 03, 2022  1200 Leukocytes,Ua: NEGATIVE [MT]    Clinical Course User Index [MT] Gillermo Murdoch A, DO    Right lower leg pain, x-rays of the right knee and tib-fib performed.  Tib-fib x-ray no acute fracture or bony abnormality seen.  X-ray of the right knee showed very mild osteoarthritis and a possible old injury healing from the tibial plateau.  Patient states she has had Toradol in the past and that has helped her with some pain.  She received a Toradol injection today IM 30 mg.  She was also placed in a hinged knee brace to give her some stability and pain relief.  She was provided with orthopedic on-call providers information for follow-up.  Diarrhea, history of IBS treated by GI specialist, off and on for the past 1-1/2 months.  She does have some mild signs of dehydration.  I recommended she really try to remain orally hydrated.  She received a dose of Zofran here today for her nausea.  I also sent Bentyl for stomach cramps and Zofran to her pharmacy for the next week to help alleviate some of her symptoms.  C. difficile testing  was collected today.  Results will be called to her and appropriate treatment via urgent care staff.  I recommend patient follow-up with her GI specialist.  She verbalized understanding and stated she would call today.  Dysuria, UA performed today was negative for leukocytes, negative nitrates.  Positive ketones, protein and glucose.  I suspect this is likely secondary to a component of dehydration. Final Clinical Impressions(s) / UC Diagnoses   Final diagnoses:  Diarrhea, unspecified type  Nausea  Acute pain of right knee     Discharge Instructions      For your right knee pain the x-rays did not show any obvious fracture, the radiologist is concerned may be an old injury.  We gave you a Toradol shot for your pain and placed you in a hinged knee brace.  I have provided the information for the on-call orthopedics group.  If your pain continues I recommend following up with them in their clinic. For your stomach cramping nausea and diarrhea we gave you today some Zofran.  I have sent to your pharmacy of medicine for stomach cramps and the antinausea medicine we gave you here today.  I recommend you call your GI doctor to schedule the soonest appointment.  We have also tested you for bacterial infection.  Urgent care staff will call you with results and appropriate treatment if it is positive.   Follow-up with your primary care provider within the next week for the above problems especially if you have no improvement.     ED Prescriptions     Medication Sig Dispense Auth. Provider   ondansetron (ZOFRAN) 4 MG tablet Take 1 tablet (4 mg total) by mouth every 8 (eight) hours as needed  for nausea or vomiting. 21 tablet Rodena Goldmann A, DO   dicyclomine (BENTYL) 20 MG tablet Take 1 tablet (20 mg total) by mouth 2 (two) times daily. 20 tablet Rodena Goldmann A, DO      PDMP not reviewed this encounter.   Elmore Guise, DO 09/03/22 1337

## 2022-09-07 ENCOUNTER — Other Ambulatory Visit (HOSPITAL_COMMUNITY): Payer: Self-pay

## 2022-09-09 ENCOUNTER — Other Ambulatory Visit (HOSPITAL_COMMUNITY): Payer: Self-pay

## 2022-09-09 ENCOUNTER — Ambulatory Visit (HOSPITAL_BASED_OUTPATIENT_CLINIC_OR_DEPARTMENT_OTHER): Payer: Medicare Other | Admitting: Pulmonary Disease

## 2022-09-23 ENCOUNTER — Ambulatory Visit (HOSPITAL_BASED_OUTPATIENT_CLINIC_OR_DEPARTMENT_OTHER): Payer: Medicare Other | Admitting: Pulmonary Disease

## 2022-09-23 NOTE — Progress Notes (Deleted)
Subjective:   PATIENT ID: Amanda [name] GENDER: female DOB: May 08, 1966, MRN: [id number]   HPI  No chief complaint on file.   Reason for Visit: Follow-up for asthma  [name] is a 57 year old female former smoker with asthma/COPD, hypertension, DM2, GERD/PUD history, hx cardiac arrest and chronic back pain who presents for asthma. Referred by PA Georjean Mode from [place].  Synopsis: She reports since November 2022 with persistent shortness of breath with minimal exertion. Has chronic cough for months at this point. Associated wheezing. She was seen by [facility] and had spirometry completed. She uses albuterol HFA three times a day. Uses nebulizer four times a day. Has nighttime symptoms. Previously tried Trelegy with partial improvement. Denies ever having COVID  PFT 09/02/21 FEV1 54%. No ratio listed Myocardial scan 09/18/21 - Abnormal (Moderate inferolateral and inferior perfusion defect) with low risk of short term to intermediate term CV events. Normal symptomatic and EKG response to stress perfusion.  11/27/21 Since our last visit she was treated for asthma exacerbation and started on Advair HFA 230 strength. Inhaler has partially improved however activity including walking up stairs is tiring and will be shortness of breath, wheezing and coughing. She will use her rescue inhaler twice a day. She has postponed her left open carpal tunnel release due to symptoms. She feels short of breath with short distances.   04/28/22 Since our last visit she reports improved asthma symptoms. She is compliant with her Advair. Rarely uses her Albuterol inhaler. Started Nucala 12/11/21. Has shortness of breath with exertion. Minimal cough and wheezing.  Since our last visit she has not needed steroids for exacerbations.  2023 Jan Feb Mar April May June July Aug Sept Oct Nov Dec     X X  O          2024 Jan Feb Mar April May June July Aug Sept Oct Nov Dec                 2025 Jan Feb Mar April May June July Aug Sept Oct Nov Dec                O = Nucala started       No data to display         Social History: Social smoker Chef x 30 years  Past Medical History:  Diagnosis Date   Anxiety    Arthritis    lower back   Asthma    Bipolar disorder ([place])    Carpal tunnel syndrome, bilateral    Chronic back pain    Depression    Diverticulosis    Fx. left wrist    GERD (gastroesophageal reflux disease)    Heart murmur    "born with"   Heart murmur    HLD (hyperlipidemia)    Hypertension    IBS (irritable bowel syndrome)    Internal hemorrhoids    Muscle spasms of neck    Neuropathy    PUD (peptic ulcer disease)      Family History  Problem Relation Age of Onset   Breast cancer Paternal Grandmother    CAD Father    Diabetes Mellitus II Father    Heart failure Father    Diabetes Father    Pancreatic cancer Mother    HIV/AIDS Brother    Other Sister        Brain tumor   Colon cancer Neg Hx      Social History  Occupational History   Occupation: Cook    Comment: [employer]  Tobacco Use   Smoking status: Former    Types: Cigarettes    Passive exposure: Never   Smokeless tobacco: Never   Tobacco comments:    Has taking a few puffs of cigarettes in the past. 04/28/2022 Patty Sermons  Vaping Use   Vaping Use: Never used  Substance and Sexual Activity   Alcohol use: Yes    Comment: heavily daily   Drug use: Not Currently    Types: Cocaine, "Crack" cocaine, Marijuana    Comment: last used two years ago   Sexual activity: Not Currently    Partners: Female    Allergies  Allergen Reactions   Penicillins Anaphylaxis    Has patient had a PCN reaction causing immediate rash, facial/tongue/throat swelling, SOB or lightheadedness with hypotension: Yes Has patient had a PCN reaction causing severe rash involving mucus membranes or skin necrosis: Yes Has patient had a PCN reaction that required hospitalization Yes Has patient had a PCN  reaction occurring within the last 10 years: No-more than 10 years ago If all of the above answers are "NO", then may proceed with Cephalosporin use.    Penicillins Anaphylaxis    DID THE REACTION INVOLVE: Swelling of the face/tongue/throat, SOB, or low BP? Yes Sudden or severe rash/hives, skin peeling, or the inside of the mouth or nose? Yes Did it require medical treatment? No When did it last happen? Within the past 10 years If all above answers are "NO", may proceed with cephalosporin use.     Asa [Aspirin] Rash   Aspirin Rash     Outpatient Medications Prior to Visit  Medication Sig Dispense Refill   albuterol (VENTOLIN HFA) 108 (90 Base) MCG/ACT inhaler Inhale 2 puffs into the lungs every 4 (four) hours as needed for wheezing or shortness of breath. (Patient not taking: Reported on 04/28/2022)     ALPRAZolam (XANAX) 0.25 MG tablet TAKE 1 2 TO 1 (ONE HALF TO ONE) TABLET BY MOUTH TWICE DAILY     ALPRAZolam (XANAX) 0.5 MG tablet Take 0.5 mg by mouth daily as needed.     amLODipine (NORVASC) 5 MG tablet Take 1 tablet (5 mg total) by mouth daily. 30 tablet 6   atorvastatin (LIPITOR) 20 MG tablet Take 20 mg by mouth daily.     atorvastatin (LIPITOR) 40 MG tablet      BELBUCA 150 MCG FILM Take 1 strip by mouth 2 (two) times daily.     BELBUCA 300 MCG FILM Take 1 strip by mouth 2 (two) times daily.     buprenorphine (BUTRANS) 10 MCG/HR PTWK 1 patch once a week.     buPROPion (WELLBUTRIN SR) 150 MG 12 hr tablet Take 150 mg by mouth 2 (two) times daily.     cetirizine (ZYRTEC) 10 MG tablet Take 10 mg by mouth daily.     cloNIDine (CATAPRES) 0.1 MG tablet      cyclobenzaprine (FLEXERIL) 10 MG tablet      dexlansoprazole (DEXILANT) 60 MG capsule      dicyclomine (BENTYL) 20 MG tablet Take 1 tablet (20 mg total) by mouth 2 (two) times daily. 20 tablet 0   diltiazem (CARDIZEM CD) 240 MG 24 hr capsule Take 240 mg by mouth daily.     divalproex (DEPAKOTE) 500 MG DR tablet      doxycycline  (VIBRA-TABS) 100 MG tablet Take 1 tablet by mouth 2 (two) times daily.     famotidine (PEPCID) 20 MG  tablet Take 20 mg by mouth 2 (two) times daily.     famotidine (PEPCID) 20 MG tablet Take by mouth.     FARXIGA 10 MG TABS tablet Take 10 mg by mouth daily.     fluticasone (FLONASE) 50 MCG/ACT nasal spray      fluticasone-salmeterol (ADVAIR HFA) 230-21 MCG/ACT inhaler Inhale 2 puffs into the lungs 2 (two) times daily. 1 each 12   gabapentin (NEURONTIN) 100 MG capsule Take 100 mg by mouth 3 (three) times daily.     gabapentin (NEURONTIN) 800 MG tablet      haloperidol (HALDOL) 10 MG tablet Take 10 mg by mouth 2 (two) times daily.     haloperidol (HALDOL) 5 MG tablet      hydrochlorothiazide (HYDRODIURIL) 25 MG tablet hydrochlorothiazide 25 mg tablet     hydrOXYzine (ATARAX) 50 MG tablet Take 1 tablet by mouth 4 (four) times daily.     hydrOXYzine (ATARAX/VISTARIL) 50 MG tablet Take 50 mg by mouth 3 (three) times daily as needed.     ipratropium (ATROVENT) 0.03 % nasal spray SMARTSIG:1 Spray(s) Both Nares Twice Daily PRN     isosorbide mononitrate (IMDUR) 120 MG 24 hr tablet Take 120 mg by mouth daily.     Lacosamide 100 MG TABS Take by mouth.     levofloxacin (LEVAQUIN) 750 MG tablet      lidocaine, PF, (XYLOCAINE) 1 % SOLN injection by Infiltration route.     LINZESS 145 MCG CAPS capsule Take 145 mcg by mouth daily.     meloxicam (MOBIC) 7.5 MG tablet Take 1 tablet by mouth daily.     Mepolizumab (NUCALA) 100 MG/ML SOAJ Inject 1 mL (100 mg total) into the skin every 28 (twenty-eight) days. 1 mL 5   metoprolol tartrate (LOPRESSOR) 50 MG tablet Take 50 mg by mouth 2 (two) times daily.     Milnacipran HCl (SAVELLA) 100 MG TABS tablet Take by mouth.     montelukast (SINGULAIR) 10 MG tablet Take 1 tablet (10 mg total) by mouth at bedtime. 90 tablet 3   nitroGLYCERIN (NITROSTAT) 0.4 MG SL tablet ONE TABLET UNDER TONGUE AS NEEDED FOR CHEST PAIN AS DIRECTED     olopatadine (PATANOL) 0.1 % ophthalmic  solution      omeprazole (PRILOSEC) 20 MG capsule Take 20 mg by mouth in the morning and at bedtime.     omeprazole (PRILOSEC) 40 MG capsule Take 40 mg by mouth 2 (two) times daily.     ondansetron (ZOFRAN) 4 MG tablet Take 1 tablet (4 mg total) by mouth every 8 (eight) hours as needed for nausea or vomiting. 21 tablet 0   oxyCODONE-acetaminophen (PERCOCET) 10-325 MG tablet Take 1 tablet by mouth every 4 (four) hours as needed for pain.     paliperidone (INVEGA) 6 MG 24 hr tablet Take 6 mg by mouth at bedtime.     SUMAtriptan (IMITREX) 50 MG tablet sumatriptan 50 mg tablet     topiramate (TOPAMAX) 100 MG tablet      topiramate (TOPAMAX) 50 MG tablet Take 50 mg by mouth at bedtime.     trazodone (DESYREL) 300 MG tablet Take 300 mg by mouth at bedtime.     triamcinolone acetonide (KENALOG-40) 40 MG/ML injection Inject into the articular space.     No facility-administered medications prior to visit.    Review of Systems  Constitutional:  Negative for chills, diaphoresis, fever, malaise/fatigue and weight loss.  HENT:  Negative for congestion.   Respiratory:  Positive  for shortness of breath. Negative for cough, hemoptysis, sputum production and wheezing.   Cardiovascular:  Negative for chest pain, palpitations and leg swelling.     Objective:   There were no vitals filed for this visit.    Physical Exam: General: Well-appearing, no acute distress HENT: [place], AT Eyes: EOMI, no scleral icterus Respiratory: ***Clear to auscultation bilaterally.  No crackles, wheezing or rales Cardiovascular: RRR, -M/R/G, no JVD Extremities:-Edema,-tenderness Neuro: AAO x4, CNII-XII grossly intact Psych: Normal mood, normal affect   Data Reviewed:  Imaging: CTA 08/25/2018-no pulmonary embolism bilateral groundglass opacities.  No parenchymal abnormalities  PFT: None on file  Labs: CBC    Component Value Date/Time   WBC 7.1 11/13/2021 1213   RBC 3.85 (L) 11/13/2021 1213   HGB 12.1 11/13/2021  1213   HGB 11.5 09/13/2018 1528   HCT 35.7 (L) 11/13/2021 1213   HCT 32.9 (L) 09/13/2018 1528   PLT 213.0 11/13/2021 1213   PLT 244 09/13/2018 1528   MCV 92.7 11/13/2021 1213   MCV 98 (H) 09/13/2018 1528   MCH 33.7 03/03/2021 1307   MCHC 33.8 11/13/2021 1213   RDW 12.5 11/13/2021 1213   RDW 12.8 09/13/2018 1528   LYMPHSABS 3.0 11/13/2021 1213   LYMPHSABS 2.2 09/13/2018 1528   MONOABS 0.8 11/13/2021 1213   EOSABS 0.4 11/13/2021 1213   EOSABS 0.2 09/13/2018 1528   BASOSABS 0.1 11/13/2021 1213   BASOSABS 0.1 09/13/2018 1528   Absolute eos  09/13/18 - 200 11/13/21 400    Assessment & Plan:   Discussion: 57 year old female former smoker with asthma/COPD, HTN, DM2, GERD/PUD hx, hx cardiac arrest and chronic back pain who presents for follow-up. Improved symptoms on biologics. Still some persistent symptoms.  Discussed clinical course and management of COPD/asthma including bronchodilator regimen and action plan for exacerbation. Patient prefers limiting prednisone use when able.  Severe persistent asthma - improved Asthma-COPD overlap Deconditioning --CONTINUE Nucala 11/2021> --CONTINUE Advair 230-21 mcg TWO puff TWICE a day --CONTINUE Albuterol TWO puffs AS NEEDED for shortness of breath --START montelukast 10 mg daily --START regular aerobic activity. Goal 10,000 steps a day  Health Maintenance Immunization History  Administered Date(s) Administered   Moderna SARS-COV2 Booster Vaccination 08/30/2020   Moderna Sars-Covid-2 Vaccination 11/02/2019, 12/05/2019   Tdap 12/15/2015    No orders of the defined types were placed in this encounter.  No orders of the defined types were placed in this encounter.   No follow-ups on file.   I have spent a total time of 35-minutes on the day of the appointment including chart review, data review, collecting history, coordinating care and discussing medical diagnosis and plan with the patient/family. Past medical history, allergies,  medications were reviewed. Pertinent imaging, labs and tests included in this note have been reviewed and interpreted independently by me.  [place], MD [facility] 09/23/2022 10:20 AM  Office Number [phone]

## 2022-10-06 ENCOUNTER — Telehealth: Payer: Self-pay | Admitting: Pulmonary Disease

## 2022-10-06 ENCOUNTER — Other Ambulatory Visit (HOSPITAL_COMMUNITY): Payer: Self-pay

## 2022-10-07 ENCOUNTER — Other Ambulatory Visit (HOSPITAL_COMMUNITY): Payer: Self-pay

## 2022-10-07 ENCOUNTER — Other Ambulatory Visit: Payer: Self-pay

## 2022-10-15 ENCOUNTER — Ambulatory Visit (HOSPITAL_BASED_OUTPATIENT_CLINIC_OR_DEPARTMENT_OTHER): Payer: Medicare Other | Admitting: Pulmonary Disease

## 2022-10-23 ENCOUNTER — Ambulatory Visit (INDEPENDENT_AMBULATORY_CARE_PROVIDER_SITE_OTHER): Payer: 59 | Admitting: Pulmonary Disease

## 2022-10-23 ENCOUNTER — Encounter (HOSPITAL_BASED_OUTPATIENT_CLINIC_OR_DEPARTMENT_OTHER): Payer: Self-pay | Admitting: Pulmonary Disease

## 2022-10-23 DIAGNOSIS — J4551 Severe persistent asthma with (acute) exacerbation: Secondary | ICD-10-CM | POA: Diagnosis not present

## 2022-10-23 MED ORDER — IPRATROPIUM BROMIDE 0.03 % NA SOLN: 2.0000 | Freq: Two times a day (BID) | NASAL | 3 refills | Status: DC

## 2022-10-23 MED ORDER — PREDNISONE 10 MG PO TABS: ORAL_TABLET | ORAL | 0 refills | Status: AC

## 2022-10-23 MED ORDER — MONTELUKAST SODIUM 10 MG PO TABS: 10.0000 mg | ORAL_TABLET | Freq: Every day | ORAL | 3 refills | Status: DC

## 2022-10-23 MED ORDER — FLUTICASONE-SALMETEROL 230-21 MCG/ACT IN AERO: 2.0000 | INHALATION_SPRAY | Freq: Two times a day (BID) | RESPIRATORY_TRACT | 5 refills | Status: DC

## 2022-10-23 NOTE — Patient Instructions (Signed)
  Severe persistent asthma, currently in exacerbation Asthma-COPD overlap Deconditioning --Prednisone taper as ordered --CONTINUE Nucala 11/2021> --CONTINUE Advair 230-21 mcg TWO puff TWICE a day --CONTINUE Albuterol TWO puffs AS NEEDED for shortness of breath --START montelukast 10 mg daily --START atrovent nasal spray --START regular aerobic activity. Goal 10,000 steps a day  Follow-up with me in 5 months

## 2022-10-23 NOTE — Progress Notes (Signed)
Subjective:   PATIENT ID: Amanda [name]: female DOB: 1966-08-21, MRN: [id number]   HPI  Chief Complaint  Patient presents with   Follow-up    Still has deep wheezing    Reason for Visit: Follow-up for asthma  Amanda [name] is a 57 year old female former smoker with asthma/COPD, hypertension, DM2, GERD/PUD history, hx cardiac arrest and chronic back pain who presents for asthma follow-up  Synopsis: She reports since November 2022 with persistent shortness of breath with minimal exertion. Has chronic cough for months at this point. Associated wheezing. She was seen by [facility] and had spirometry completed. She uses albuterol HFA three times a day. Uses nebulizer four times a day. Has nighttime symptoms. Previously tried Trelegy with partial improvement. Denies ever having COVID  PFT 09/02/21 FEV1 54%. No ratio listed Myocardial scan 09/18/21 - Abnormal (Moderate inferolateral and inferior perfusion defect) with low risk of short term to intermediate term CV events. Normal symptomatic and EKG response to stress perfusion.  11/27/21 Since our last visit she was treated for asthma exacerbation and started on Advair HFA 230 strength. Inhaler has partially improved however activity including walking up stairs is tiring and will be shortness of breath, wheezing and coughing. She will use her rescue inhaler twice a day. She has postponed her left open carpal tunnel release due to symptoms. She feels short of breath with short distances.   04/28/22 Since our last visit she reports improved asthma symptoms. She is compliant with her Advair. Rarely uses her Albuterol inhaler. Started Nucala 12/11/21. Has shortness of breath with exertion. Minimal cough and wheezing.  Since our last visit she has not needed steroids for exacerbations.  10/23/22 Since our last visit she has been compliant with Advair and Nucala. She is compliant Advair and singulair . She is having increased  cough and wheezing recently. Believes it is triggered by pollen. Used nebulizer last week but usually uses rarely before this. For her nasal congestion she has not been taking her singulair.  2023 Jan Feb Mar April May June July Aug Sept Oct Nov Dec     X X  O          2024 Jan Feb Mar April May June July Aug Sept Oct Nov Dec     X           2025 Jan Feb Mar April May June July Aug Sept Oct Nov Dec                O = Nucala started   Asthma Control Test ACT Total Score  10/23/2022 10:03 AM 16   Social History: Social smoker Chef x 30 years  Past Medical History:  Diagnosis Date   Anxiety    Arthritis    lower back   Asthma    Bipolar disorder ([place])    Carpal tunnel syndrome, bilateral    Chronic back pain    Depression    Diverticulosis    Fx. left wrist    GERD (gastroesophageal reflux disease)    Heart murmur    "born with"   Heart murmur    HLD (hyperlipidemia)    Hypertension    IBS (irritable bowel syndrome)    Internal hemorrhoids    Muscle spasms of neck    Neuropathy    PUD (peptic ulcer disease)      Family History  Problem Relation Age of Onset   Breast cancer Paternal Grandmother    CAD  Father    Diabetes Mellitus II Father    Heart failure Father    Diabetes Father    Pancreatic cancer Mother    HIV/AIDS Brother    Other Sister        Brain tumor   Colon cancer Neg Hx      Social History   Occupational History   Occupation: Cook    Comment: [employer]  Tobacco Use   Smoking status: Former    Types: Cigarettes    Passive exposure: Never   Smokeless tobacco: Never   Tobacco comments:    Has taking a few puffs of cigarettes in the past. 04/28/2022 Patty Sermons  Vaping Use   Vaping Use: Never used  Substance and Sexual Activity   Alcohol use: Yes    Comment: heavily daily   Drug use: Not Currently    Types: Cocaine, "Crack" cocaine, Marijuana    Comment: last used two years ago   Sexual activity: Not Currently    Partners: Female    Allergies   Allergen Reactions   Penicillins Anaphylaxis    Has patient had a PCN reaction causing immediate rash, facial/tongue/throat swelling, SOB or lightheadedness with hypotension: Yes Has patient had a PCN reaction causing severe rash involving mucus membranes or skin necrosis: Yes Has patient had a PCN reaction that required hospitalization Yes Has patient had a PCN reaction occurring within the last 10 years: No-more than 10 years ago If all of the above answers are "NO", then may proceed with Cephalosporin use.    Penicillins Anaphylaxis    DID THE REACTION INVOLVE: Swelling of the face/tongue/throat, SOB, or low BP? Yes Sudden or severe rash/hives, skin peeling, or the inside of the mouth or nose? Yes Did it require medical treatment? No When did it last happen? Within the past 10 years If all above answers are "NO", may proceed with cephalosporin use.     Asa [Aspirin] Rash   Aspirin Rash     Outpatient Medications Prior to Visit  Medication Sig Dispense Refill   albuterol (VENTOLIN HFA) 108 (90 Base) MCG/ACT inhaler Inhale 2 puffs into the lungs every 4 (four) hours as needed for wheezing or shortness of breath.     ALPRAZolam (XANAX) 0.5 MG tablet Take 0.5 mg by mouth daily as needed.     amLODipine (NORVASC) 5 MG tablet Take 1 tablet (5 mg total) by mouth daily. 30 tablet 6   atorvastatin (LIPITOR) 20 MG tablet Take 20 mg by mouth daily.     BELBUCA 300 MCG FILM Take 1 strip by mouth 2 (two) times daily.     buPROPion (WELLBUTRIN SR) 150 MG 12 hr tablet Take 150 mg by mouth 2 (two) times daily.     cloNIDine (CATAPRES) 0.1 MG tablet      dexlansoprazole (DEXILANT) 60 MG capsule      dicyclomine (BENTYL) 20 MG tablet Take 1 tablet (20 mg total) by mouth 2 (two) times daily. 20 tablet 0   diltiazem (CARDIZEM CD) 240 MG 24 hr capsule Take 240 mg by mouth daily.     divalproex (DEPAKOTE) 500 MG DR tablet      famotidine (PEPCID) 20 MG tablet Take by mouth.     FARXIGA 10 MG TABS  tablet Take 10 mg by mouth daily.     fluticasone (FLONASE) 50 MCG/ACT nasal spray      haloperidol (HALDOL) 10 MG tablet Take 10 mg by mouth 2 (two) times daily.     hydrochlorothiazide (HYDRODIURIL)  25 MG tablet hydrochlorothiazide 25 mg tablet     hydrOXYzine (ATARAX/VISTARIL) 50 MG tablet Take 50 mg by mouth 3 (three) times daily as needed.     isosorbide mononitrate (IMDUR) 120 MG 24 hr tablet Take 120 mg by mouth daily.     Lacosamide 100 MG TABS Take by mouth.     lidocaine, PF, (XYLOCAINE) 1 % SOLN injection by Infiltration route.     meloxicam (MOBIC) 7.5 MG tablet Take 1 tablet by mouth daily.     Mepolizumab (NUCALA) 100 MG/ML SOAJ Inject 1 mL (100 mg total) into the skin every 28 (twenty-eight) days. 1 mL 5   metoprolol tartrate (LOPRESSOR) 50 MG tablet Take 50 mg by mouth 2 (two) times daily.     Milnacipran HCl (SAVELLA) 100 MG TABS tablet Take by mouth.     nitroGLYCERIN (NITROSTAT) 0.4 MG SL tablet ONE TABLET UNDER TONGUE AS NEEDED FOR CHEST PAIN AS DIRECTED     olopatadine (PATANOL) 0.1 % ophthalmic solution      omeprazole (PRILOSEC) 20 MG capsule Take 20 mg by mouth in the morning and at bedtime.     ondansetron (ZOFRAN) 4 MG tablet Take 1 tablet (4 mg total) by mouth every 8 (eight) hours as needed for nausea or vomiting. 21 tablet 0   oxyCODONE-acetaminophen (PERCOCET) 10-325 MG tablet Take 1 tablet by mouth every 4 (four) hours as needed for pain.     paliperidone (INVEGA) 6 MG 24 hr tablet Take 6 mg by mouth at bedtime.     SUMAtriptan (IMITREX) 50 MG tablet sumatriptan 50 mg tablet     topiramate (TOPAMAX) 100 MG tablet      trazodone (DESYREL) 300 MG tablet Take 300 mg by mouth at bedtime.     triamcinolone acetonide (KENALOG-40) 40 MG/ML injection Inject into the articular space.     ALPRAZolam (XANAX) 0.25 MG tablet TAKE 1 2 TO 1 (ONE HALF TO ONE) TABLET BY MOUTH TWICE DAILY     atorvastatin (LIPITOR) 40 MG tablet      BELBUCA 150 MCG FILM Take 1 strip by mouth 2  (two) times daily.     buprenorphine (BUTRANS) 10 MCG/HR PTWK 1 patch once a week.     cetirizine (ZYRTEC) 10 MG tablet Take 10 mg by mouth daily.     cyclobenzaprine (FLEXERIL) 10 MG tablet      doxycycline (VIBRA-TABS) 100 MG tablet Take 1 tablet by mouth 2 (two) times daily.     famotidine (PEPCID) 20 MG tablet Take 20 mg by mouth 2 (two) times daily.     fluticasone-salmeterol (ADVAIR HFA) 230-21 MCG/ACT inhaler Inhale 2 puffs into the lungs 2 (two) times daily. 1 each 12   gabapentin (NEURONTIN) 100 MG capsule Take 100 mg by mouth 3 (three) times daily.     gabapentin (NEURONTIN) 800 MG tablet      haloperidol (HALDOL) 5 MG tablet      hydrOXYzine (ATARAX) 50 MG tablet Take 1 tablet by mouth 4 (four) times daily.     ipratropium (ATROVENT) 0.03 % nasal spray SMARTSIG:1 Spray(s) Both Nares Twice Daily PRN     levofloxacin (LEVAQUIN) 750 MG tablet      LINZESS 145 MCG CAPS capsule Take 145 mcg by mouth daily.     montelukast (SINGULAIR) 10 MG tablet Take 1 tablet (10 mg total) by mouth at bedtime. 90 tablet 3   omeprazole (PRILOSEC) 40 MG capsule Take 40 mg by mouth 2 (two) times daily.  topiramate (TOPAMAX) 50 MG tablet Take 50 mg by mouth at bedtime.     gabapentin (NEURONTIN) 100 MG capsule Take 2 capsules (200 mg total) by mouth 2 (two) times daily.     No facility-administered medications prior to visit.    Review of Systems  Constitutional:  Negative for chills, diaphoresis, fever, malaise/fatigue and weight loss.  HENT:  Negative for congestion.   Respiratory:  Positive for cough, sputum production, shortness of breath and wheezing. Negative for hemoptysis.   Cardiovascular:  Negative for chest pain, palpitations and leg swelling.     Objective:   Vitals:   10/23/22 0956  BP: 118/72  Pulse: 75  SpO2: 98%  Weight: 238 lb (108 kg)  Height: '5\' 7"'$  (1.702 m)   SpO2: 98 % O2 Device: None (Room air)  Physical Exam: General: Well-appearing, no acute distress HENT:  [place], AT Eyes: EOMI, no scleral icterus Respiratory: Clear to auscultation bilaterally.  No crackles, wheezing or rales Cardiovascular: RRR, -M/R/G, no JVD Extremities:-Edema,-tenderness Neuro: AAO x4, CNII-XII grossly intact Psych: Normal mood, normal affect   Data Reviewed:  Imaging: CTA 08/25/2018-no pulmonary embolism bilateral groundglass opacities.  No parenchymal abnormalities  PFT: None on file  Labs: CBC    Component Value Date/Time   WBC 7.1 11/13/2021 1213   RBC 3.85 (L) 11/13/2021 1213   HGB 12.1 11/13/2021 1213   HGB 11.5 09/13/2018 1528   HCT 35.7 (L) 11/13/2021 1213   HCT 32.9 (L) 09/13/2018 1528   PLT 213.0 11/13/2021 1213   PLT 244 09/13/2018 1528   MCV 92.7 11/13/2021 1213   MCV 98 (H) 09/13/2018 1528   MCH 33.7 03/03/2021 1307   MCHC 33.8 11/13/2021 1213   RDW 12.5 11/13/2021 1213   RDW 12.8 09/13/2018 1528   LYMPHSABS 3.0 11/13/2021 1213   LYMPHSABS 2.2 09/13/2018 1528   MONOABS 0.8 11/13/2021 1213   EOSABS 0.4 11/13/2021 1213   EOSABS 0.2 09/13/2018 1528   BASOSABS 0.1 11/13/2021 1213   BASOSABS 0.1 09/13/2018 1528   Absolute eos  09/13/18 - 200 11/13/21 400    Assessment & Plan:   Discussion: 57 year old female former smoker with asthma/COPD, HTN, DM2, GERD/PUD, hx cardiac arrest and chronic pain who presents for follow-up. Currently in exacerbation. Discussed clinical course and management of COPD/asthma including bronchodilator regimen and action plan for exacerbation. Patient prefers limiting prednisone use when able.  Severe persistent asthma, currently in exacerbation Asthma-COPD overlap Deconditioning --Prednisone taper as ordered --CONTINUE Nucala 11/2021> --CONTINUE Advair 230-21 mcg TWO puff TWICE a day --CONTINUE Albuterol TWO puffs AS NEEDED for shortness of breath --START montelukast 10 mg daily --START atrovent nasal spray --START regular aerobic activity. Goal 10,000 steps a day  Health Maintenance Immunization History   Administered Date(s) Administered   Moderna SARS-COV2 Booster Vaccination 08/30/2020   Moderna Sars-Covid-2 Vaccination 11/02/2019, 12/05/2019   Tdap 12/15/2015    No orders of the defined types were placed in this encounter.  Meds ordered this encounter  Medications   predniSONE (DELTASONE) 10 MG tablet    Sig: Take 4 tablets (40 mg total) by mouth daily with breakfast for 2 days, THEN 3 tablets (30 mg total) daily with breakfast for 2 days, THEN 2 tablets (20 mg total) daily with breakfast for 2 days, THEN 1 tablet (10 mg total) daily with breakfast for 2 days.    Dispense:  20 tablet    Refill:  0   montelukast (SINGULAIR) 10 MG tablet    Sig: Take 1  tablet (10 mg total) by mouth at bedtime.    Dispense:  90 tablet    Refill:  3   ipratropium (ATROVENT) 0.03 % nasal spray    Sig: Place 2 sprays into the nose 2 (two) times daily.    Dispense:  30 mL    Refill:  3   fluticasone-salmeterol (ADVAIR HFA) 230-21 MCG/ACT inhaler    Sig: Inhale 2 puffs into the lungs 2 (two) times daily.    Dispense:  1 each    Refill:  5    Return in about 5 months (around 03/25/2023).   I have spent a total time of 35-minutes on the day of the appointment including chart review, data review, collecting history, coordinating care and discussing medical diagnosis and plan with the patient/family. Past medical history, allergies, medications were reviewed. Pertinent imaging, labs and tests included in this note have been reviewed and interpreted independently by me.   [place], MD [facility] 10/23/2022 12:59 PM  Office Number [phone]

## 2022-11-02 ENCOUNTER — Other Ambulatory Visit: Payer: Self-pay

## 2022-11-03 ENCOUNTER — Other Ambulatory Visit (HOSPITAL_COMMUNITY): Payer: Self-pay

## 2022-11-03 ENCOUNTER — Other Ambulatory Visit: Payer: Self-pay | Admitting: Pulmonary Disease

## 2022-11-03 DIAGNOSIS — J4551 Severe persistent asthma with (acute) exacerbation: Secondary | ICD-10-CM

## 2022-11-03 MED ORDER — NUCALA 100 MG/ML ~~LOC~~ SOAJ
100.0000 mg | SUBCUTANEOUS | 5 refills | Status: DC
  Filled 2022-11-03: qty 1, 28d supply, fill #0
  Filled 2022-12-01: qty 1, 28d supply, fill #1
  Filled 2022-12-30: qty 1, 28d supply, fill #2
  Filled 2023-01-29: qty 1, 28d supply, fill #3
  Filled 2023-02-26: qty 1, 28d supply, fill #4
  Filled 2023-03-25: qty 1, 28d supply, fill #5

## 2022-11-03 NOTE — Telephone Encounter (Signed)
Refill sent for [facility] to [place]: [phone]   Dose: 100 mg  SQ every 4 weeks  Last OV: 10/23/22 Provider: Dr. Loanne Drilling  Next OV: 5 months (not yet scheduled)  [place] Saliva, PharmD, MPH, BCPS Clinical Pharmacist (Rheumatology and Pulmonology)

## 2022-11-04 ENCOUNTER — Other Ambulatory Visit: Payer: Self-pay

## 2022-11-04 ENCOUNTER — Ambulatory Visit
Admission: RE | Admit: 2022-11-04 | Discharge: 2022-11-04 | Disposition: A | Discharge status: Home / Self Care | Payer: 59 | Source: Ambulatory Visit | Attending: Family Medicine | Admitting: Family Medicine

## 2022-11-04 ENCOUNTER — Other Ambulatory Visit (HOSPITAL_COMMUNITY): Payer: Self-pay

## 2022-11-04 DIAGNOSIS — Z1231 Encounter for screening mammogram for malignant neoplasm of breast: Secondary | ICD-10-CM

## 2022-12-01 ENCOUNTER — Other Ambulatory Visit (HOSPITAL_COMMUNITY): Payer: Self-pay

## 2022-12-02 ENCOUNTER — Other Ambulatory Visit (HOSPITAL_COMMUNITY): Payer: Self-pay

## 2022-12-09 NOTE — Telephone Encounter (Signed)
v

## 2022-12-22 ENCOUNTER — Telehealth: Payer: Self-pay | Admitting: Pulmonary Disease

## 2022-12-22 NOTE — Telephone Encounter (Signed)
Fax received from Dr. Gean Birchwood with [facility] to perform a Right Knee Arthroscopy on patient.  Patient needs surgery clearance. Surgery is Pending. Patient was seen on 10/23/2022. Office protocol is a risk assessment can be sent to surgeon if patient has been seen in 60 days or less.   Sending to Dr. Everardo All for risk assessment or recommendations if patient needs to be seen in office prior to surgical procedure.     Please don't close encounter

## 2022-12-23 NOTE — Telephone Encounter (Signed)
No clinic appointment needed. Contacted patient and she has recovered from her last exacerbation in March. Overall well-controlled COPD-asthma with only 1 exacerbation in the last 11 months. Ok for surgery from pulmonary standpoint  Peri-operative Assessment of Pulmonary Risk for Non-Thoracic Surgery (right knee arthroscopy):  For Amanda [name], risk of perioperative pulmonary complications is increased by:  [ ]  Age greater than 65 years  [x ] COPD  [ ]  Serum albumin <3.5  [ ]  Smoking  [ ]  Obstructive sleep apnea  [ ]  NYHA Class II Pulmonary Hypertension  ARISCAT - LOW RISK 1.6% post-op pulmonary risks.  Respiratory complications generally occur in 1% of ASA Class I patients, 5% of ASA Class II and 10% of ASA Class III-IV patients These complications rarely result in mortality and include postoperative pneumonia, atelectasis, pulmonary embolism, ARDS and increased time requiring postoperative mechanical ventilation.  Overall, I recommend proceeding with the surgery if the risk for respiratory complications are outweighed by the potential benefits. This will need to be discussed between the patient and surgeon.  To reduce risks of respiratory complications, I recommend: --Pre- and post-operative incentive spirometry performed frequently while awake --Avoiding use of pancuronium during anesthesia if able  I have discussed the risk factors and recommendations above with the patient.

## 2022-12-24 NOTE — Telephone Encounter (Signed)
OV notes and clearance form have been faxed back to [facility]. Nothing further needed at this time.  

## 2022-12-30 ENCOUNTER — Other Ambulatory Visit: Payer: Self-pay

## 2022-12-30 ENCOUNTER — Other Ambulatory Visit (HOSPITAL_COMMUNITY): Payer: Self-pay

## 2023-01-11 ENCOUNTER — Other Ambulatory Visit: Payer: Self-pay | Admitting: Orthopedic Surgery

## 2023-01-27 ENCOUNTER — Other Ambulatory Visit (HOSPITAL_COMMUNITY): Payer: Self-pay

## 2023-01-29 ENCOUNTER — Other Ambulatory Visit (HOSPITAL_COMMUNITY): Payer: Self-pay

## 2023-01-29 NOTE — Patient Instructions (Signed)
DUE TO COVID-19 ONLY TWO VISITORS  (aged 58 and older)  ARE ALLOWED TO COME WITH YOU AND STAY IN THE WAITING ROOM ONLY DURING PRE OP AND PROCEDURE.   **NO VISITORS ARE ALLOWED IN THE SHORT STAY AREA OR RECOVERY ROOM!!**  IF YOU WILL BE ADMITTED INTO THE HOSPITAL YOU ARE ALLOWED ONLY FOUR SUPPORT PEOPLE DURING VISITATION HOURS ONLY (7 AM -8PM)   The support person(s) must pass our screening, gel in and out, and wear a mask at all times, including in the patient's room. Patients must also wear a mask when staff or their support person are in the room. Visitors GUEST BADGE MUST BE WORN VISIBLY  One adult visitor may remain with you overnight and MUST be in the room by 8 P.M.     Your procedure is scheduled on: 02/08/23   Report to [facility] Main Entrance    Report to admitting at : 2:00 PM   Call this number if you have problems the morning of surgery [phone]   Do not eat food :After Midnight.   After Midnight you may have the following liquids until : 1:00 PM DAY OF SURGERY  Water Black Coffee (sugar ok, NO MILK/CREAM OR CREAMERS)  Tea (sugar ok, NO MILK/CREAM OR CREAMERS) regular and decaf                             Plain Jell-O (NO RED)                                           Fruit ices (not with fruit pulp, NO RED)                                     Popsicles (NO RED)                                                                  Juice: apple, WHITE grape, WHITE cranberry Sports drinks like Gatorade (NO RED)    The day of surgery:  Drink ONE (1) Pre-Surgery Clear G2 at: 1:00 PM the morning of surgery. Drink in one sitting. Do not sip.  This drink was given to you during your hospital  pre-op appointment visit. Nothing else to drink after completing the  Pre-Surgery Clear Ensure or G2.          If you have questions, please contact your surgeon's office.  Oral Hygiene is also important to reduce your risk of infection.                                     Remember - BRUSH YOUR TEETH THE MORNING OF SURGERY WITH YOUR REGULAR TOOTHPASTE  DENTURES WILL BE REMOVED PRIOR TO SURGERY PLEASE DO NOT APPLY "Poly grip" OR ADHESIVES!!!   Do NOT smoke after Midnight   Take these medicines the morning of surgery with A SIP OF WATER: clonidine,metoprolol,amlodipine,diltiazem,isosorbide,omeprazole,Depakote,gabapentin,lacosamide,alprazolam,hydroxyzine,topiramate,bupropion,famotidine,cetirizine. How to Manage Your Diabetes Before and After  Surgery  Why is it important to control my blood sugar before and after surgery? Improving blood sugar levels before and after surgery helps healing and can limit problems. A way of improving blood sugar control is eating a healthy diet by:  Eating less sugar and carbohydrates  Increasing activity/exercise  Talking with your doctor about reaching your blood sugar goals High blood sugars (greater than 180 mg/dL) can raise your risk of infections and slow your recovery, so you will need to focus on controlling your diabetes during the weeks before surgery. Make sure that the doctor who takes care of your diabetes knows about your planned surgery including the date and location.  How do I manage my blood sugar before surgery? Check your blood sugar at least 4 times a day, starting 2 days before surgery, to make sure that the level is not too high or low. Check your blood sugar the morning of your surgery when you wake up and every 2 hours until you get to the Short Stay unit. If your blood sugar is less than 70 mg/dL, you will need to treat for low blood sugar: Do not take insulin. Treat a low blood sugar (less than 70 mg/dL) with  cup of clear juice (cranberry or apple), 4 glucose tablets, OR glucose gel. Recheck blood sugar in 15 minutes after treatment (to make sure it is greater than 70 mg/dL). If your blood sugar is not greater than 70 mg/dL on recheck, call [phone] for further instructions. Report your blood sugar  to the short stay nurse when you get to Short Stay.  If you are admitted to the hospital after surgery: Your blood sugar will be checked by the staff and you will probably be given insulin after surgery (instead of oral diabetes medicines) to make sure you have good blood sugar levels. The goal for blood sugar control after surgery is 80-180 mg/dL.   WHAT DO I DO ABOUT MY DIABETES MEDICATION?  HOLD farxiga 72 hours before surgery(3 days). Last dose: 02/04/23     THE MORNING OF SURGERY, DO NOT TAKE ANY ORAL DIABETIC MEDICATIONS DAY OF YOUR SURGERY  DO NOT TAKE THE FOLLOWING 7 DAYS PRIOR TO SURGERY: Ozempic, Wegovy, Rybelsus (Semaglutide), Byetta (exenatide), Bydureon (exenatide ER), Victoza, Saxenda (liraglutide), or Trulicity (dulaglutide) Mounjaro (Tirzepatide) Adlyxin (Lixisenatide), Polyethylene Glycol Loxenatide.  Bring CPAP mask and tubing day of surgery.                              You may not have any metal on your body including hair pins, jewelry, and body piercing             Do not wear make-up, lotions, powders, perfumes/cologne, or deodorant  Do not wear nail polish including gel and S&S, artificial/acrylic nails, or any other type of covering on natural nails including finger and toenails. If you have artificial nails, gel coating, etc. that needs to be removed by a nail salon please have this removed prior to surgery or surgery may need to be canceled/ delayed if the surgeon/ anesthesia feels like they are unable to be safely monitored.   Do not shave  48 hours prior to surgery.    Do not bring valuables to the hospital. [facility] IS NOT             RESPONSIBLE   FOR VALUABLES.   Contacts, glasses, or bridgework may not be worn into surgery.  Bring small overnight bag day of surgery.   DO NOT BRING YOUR HOME MEDICATIONS TO THE HOSPITAL. PHARMACY WILL DISPENSE MEDICATIONS LISTED ON YOUR MEDICATION LIST TO YOU DURING YOUR ADMISSION IN THE HOSPITAL!    Patients  discharged on the day of surgery will not be allowed to drive home.  Someone NEEDS to stay with you for the first 24 hours after anesthesia.   Special Instructions: Bring a copy of your healthcare power of attorney and living will documents         the day of surgery if you haven't scanned them before.              Please read over the following fact sheets you were given: IF YOU HAVE QUESTIONS ABOUT YOUR PRE-OP INSTRUCTIONS PLEASE CALL [phone]    [facility] - Preparing for Surgery Before surgery, you can play an important role.  Because skin is not sterile, your skin needs to be as free of germs as possible.  You can reduce the number of germs on your skin by washing with CHG (chlorahexidine gluconate) soap before surgery.  CHG is an antiseptic cleaner which kills germs and bonds with the skin to continue killing germs even after washing. Please DO NOT use if you have an allergy to CHG or antibacterial soaps.  If your skin becomes reddened/irritated stop using the CHG and inform your nurse when you arrive at Short Stay. Do not shave (including legs and underarms) for at least 48 hours prior to the first CHG shower.  You may shave your face/neck. Please follow these instructions carefully:  1.  Shower with CHG Soap the night before surgery and the  morning of Surgery.  2.  If you choose to wash your hair, wash your hair first as usual with your  normal  shampoo.  3.  After you shampoo, rinse your hair and body thoroughly to remove the  shampoo.                           4.  Use CHG as you would any other liquid soap.  You can apply chg directly  to the skin and wash                       Gently with a scrungie or clean washcloth.  5.  Apply the CHG Soap to your body ONLY FROM THE NECK DOWN.   Do not use on face/ open                           Wound or open sores. Avoid contact with eyes, ears mouth and genitals (private parts).                       Wash face,  Genitals (private parts) with  your normal soap.             6.  Wash thoroughly, paying special attention to the area where your surgery  will be performed.  7.  Thoroughly rinse your body with warm water from the neck down.  8.  DO NOT shower/wash with your normal soap after using and rinsing off  the CHG Soap.                9.  Pat yourself dry with a clean towel.  10.  Wear clean pajamas.            11.  Place clean sheets on your bed the night of your first shower and do not  sleep with pets. Day of Surgery : Do not apply any lotions/deodorants the morning of surgery.  Please wear clean clothes to the hospital/surgery center.  FAILURE TO FOLLOW THESE INSTRUCTIONS MAY RESULT IN THE CANCELLATION OF YOUR SURGERY PATIENT SIGNATURE_________________________________  NURSE SIGNATURE__________________________________  ________________________________________________________________________  Amanda [name]  An incentive spirometer is a tool that can help keep your lungs clear and active. This tool measures how well you are filling your lungs with each breath. Taking long deep breaths may help reverse or decrease the chance of developing breathing (pulmonary) problems (especially infection) following: A long period of time when you are unable to move or be active. BEFORE THE PROCEDURE  If the spirometer includes an indicator to show your best effort, your nurse or respiratory therapist will set it to a desired goal. If possible, sit up straight or lean slightly forward. Try not to slouch. Hold the incentive spirometer in an upright position. INSTRUCTIONS FOR USE  Sit on the edge of your bed if possible, or sit up as far as you can in bed or on a chair. Hold the incentive spirometer in an upright position. Breathe out normally. Place the mouthpiece in your mouth and seal your lips tightly around it. Breathe in slowly and as deeply as possible, raising the piston or the ball toward the top of the  column. Hold your breath for 3-5 seconds or for as long as possible. Allow the piston or ball to fall to the bottom of the column. Remove the mouthpiece from your mouth and breathe out normally. Rest for a few seconds and repeat Steps 1 through 7 at least 10 times every 1-2 hours when you are awake. Take your time and take a few normal breaths between deep breaths. The spirometer may include an indicator to show your best effort. Use the indicator as a goal to work toward during each repetition. After each set of 10 deep breaths, practice coughing to be sure your lungs are clear. If you have an incision (the cut made at the time of surgery), support your incision when coughing by placing a pillow or rolled up towels firmly against it. Once you are able to get out of bed, walk around indoors and cough well. You may stop using the incentive spirometer when instructed by your caregiver.  RISKS AND COMPLICATIONS Take your time so you do not get dizzy or light-headed. If you are in pain, you may need to take or ask for pain medication before doing incentive spirometry. It is harder to take a deep breath if you are having pain. AFTER USE Rest and breathe slowly and easily. It can be helpful to keep track of a log of your progress. Your caregiver can provide you with a simple table to help with this. If you are using the spirometer at home, follow these instructions: SEEK MEDICAL CARE IF:  You are having difficultly using the spirometer. You have trouble using the spirometer as often as instructed. Your pain medication is not giving enough relief while using the spirometer. You develop fever of 100.5 F (38.1 C) or higher. SEEK IMMEDIATE MEDICAL CARE IF:  You cough up bloody sputum that had not been present before. You develop fever of 102 F (38.9 C) or greater. You develop worsening pain at or near the  incision site. MAKE SURE YOU:  Understand these instructions. Will watch your  condition. Will get help right away if you are not doing well or get worse. Document Released: 12/21/2006 Document Revised: 11/02/2011 Document Reviewed: 02/21/2007 [facility] Patient Information 2014 [place], [place].   ________________________________________________________________________

## 2023-02-01 ENCOUNTER — Other Ambulatory Visit: Payer: Self-pay

## 2023-02-02 ENCOUNTER — Other Ambulatory Visit: Payer: Self-pay

## 2023-02-02 ENCOUNTER — Encounter (HOSPITAL_COMMUNITY): Payer: Self-pay

## 2023-02-02 ENCOUNTER — Encounter (HOSPITAL_COMMUNITY)
Admission: RE | Admit: 2023-02-02 | Discharge: 2023-02-02 | Disposition: A | Discharge status: Home / Self Care | Payer: 59 | Source: Ambulatory Visit | Attending: Orthopedic Surgery | Admitting: Orthopedic Surgery

## 2023-02-02 VITALS — BP 130/89 | HR 77 | Temp 97.8°F | Ht 67.0 in

## 2023-02-02 DIAGNOSIS — M2341 Loose body in knee, right knee: Secondary | ICD-10-CM | POA: Insufficient documentation

## 2023-02-02 DIAGNOSIS — Z01818 Encounter for other preprocedural examination: Secondary | ICD-10-CM | POA: Insufficient documentation

## 2023-02-02 DIAGNOSIS — E119 Type 2 diabetes mellitus without complications: Secondary | ICD-10-CM | POA: Diagnosis not present

## 2023-02-02 DIAGNOSIS — I2510 Atherosclerotic heart disease of native coronary artery without angina pectoris: Secondary | ICD-10-CM | POA: Insufficient documentation

## 2023-02-02 DIAGNOSIS — Z87891 Personal history of nicotine dependence: Secondary | ICD-10-CM | POA: Insufficient documentation

## 2023-02-02 DIAGNOSIS — I10 Essential (primary) hypertension: Secondary | ICD-10-CM | POA: Insufficient documentation

## 2023-02-02 DIAGNOSIS — Z8674 Personal history of sudden cardiac arrest: Secondary | ICD-10-CM | POA: Insufficient documentation

## 2023-02-02 DIAGNOSIS — J4489 Other specified chronic obstructive pulmonary disease: Secondary | ICD-10-CM | POA: Diagnosis not present

## 2023-02-02 DIAGNOSIS — F319 Bipolar disorder, unspecified: Secondary | ICD-10-CM | POA: Diagnosis not present

## 2023-02-02 DIAGNOSIS — K219 Gastro-esophageal reflux disease without esophagitis: Secondary | ICD-10-CM | POA: Diagnosis not present

## 2023-02-02 HISTORY — DX: Pneumonia, unspecified organism: J18.9

## 2023-02-02 HISTORY — DX: Type 2 diabetes mellitus without complications: E11.9

## 2023-02-02 HISTORY — DX: Angina pectoris, unspecified: I20.9

## 2023-02-02 HISTORY — DX: Chronic obstructive pulmonary disease, unspecified: J44.9

## 2023-02-02 LAB — HEMOGLOBIN A1C
Hgb A1c MFr Bld: 4.8 % (ref 4.8–5.6)
Mean Plasma Glucose: 91.06 mg/dL

## 2023-02-02 LAB — BASIC METABOLIC PANEL
Anion gap: 10 (ref 5–15)
BUN: 16 mg/dL (ref 6–20)
CO2: 25 mmol/L (ref 22–32)
Calcium: 9.1 mg/dL (ref 8.9–10.3)
Chloride: 103 mmol/L (ref 98–111)
Creatinine, Ser: 1.04 mg/dL — ABNORMAL HIGH (ref 0.44–1.00)
GFR, Estimated: >60 mL/min (ref >60)
Glucose, Bld: 90 mg/dL (ref 70–99)
Potassium: 4.2 mmol/L (ref 3.5–5.1)
Sodium: 138 mmol/L (ref 135–145)

## 2023-02-02 LAB — CBC
HCT: 38.8 % (ref 36.0–46.0)
Hemoglobin: 12.9 g/dL (ref 12.0–15.0)
MCH: 32.5 pg (ref 26.0–34.0)
MCHC: 33.2 g/dL (ref 30.0–36.0)
MCV: 97.7 fL (ref 80.0–100.0)
Platelets: 267 10*3/uL (ref 150–400)
RBC: 3.97 MIL/uL (ref 3.87–5.11)
RDW: 12 % (ref 11.5–15.5)
WBC: 7.3 10*3/uL (ref 4.0–10.5)
nRBC: 0 % (ref 0.0–0.2)

## 2023-02-02 LAB — GLUCOSE, CAPILLARY: Glucose-Capillary: 103 mg/dL — ABNORMAL HIGH (ref 70–99)

## 2023-02-02 NOTE — Progress Notes (Signed)
For Short Stay: COVID SWAB appointment date:  Bowel Prep reminder:   For Anesthesia: PCP - N/A Cardiologist -  Dr. [name] Pulmonologist: Dr. Lynnell Jude: LOV: 10/23/22 Clearance: 12/23/22 Chest x-ray -  EKG -  Stress Test -  ECHO - 08/27/18 Cardiac Cath - 08/30/18 Pacemaker/ICD device last checked: Pacemaker orders received: Device Rep notified:  Spinal Cord Stimulator:  Sleep Study -  CPAP -   Fasting Blood Sugar -  Checks Blood Sugar _____ times a day Date and result of last Hgb A1c-  Last dose of GLP1 agonist-  GLP1 instructions:   Last dose of SGLT-2 inhibitors-  SGLT-2 instructions:   Blood Thinner Instructions: Aspirin Instructions: Last Dose:  Activity level: Can go up a flight of stairs and activities of daily living without stopping and without chest pain and/or shortness of breath   Able to exercise without chest pain and/or shortness of breath  Anesthesia review: Hx: Heart murmur,HTN,COPD,DM2,Cardiac arrest(2020)  Patient denies shortness of breath, fever, cough and chest pain at PAT appointment   Patient verbalized understanding of instructions that were given to them at the PAT appointment. Patient was also instructed that they will need to review over the PAT instructions again at home before surgery.

## 2023-02-03 DIAGNOSIS — M2341 Loose body in knee, right knee: Secondary | ICD-10-CM | POA: Diagnosis present

## 2023-02-03 NOTE — H&P (Signed)
Amanda [name] is an 57 y.o. female.   Chief Complaint: Right Knee Pain  HPI: Ms. Suthard is seen in consultation from Dr. Althea Charon for a loose body in her right knee.  Her right knee has bothered her since the beginning of the year.  When she was seen for a presumed medial meniscal injury received a cortisone injection that provided at best short-term pain relief.  She is also on long-term pain management with  for her back.  Reviewing her PMP doubt a looks like she takes 6 of the 10 mg oxycodones every day as well as a drug called [place] at which is an opioid agonist..  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  The MRI also showed no effusion.  Past Medical History:  Diagnosis Date   Anginal pain (HCC)    Anxiety    Arthritis    lower back   Asthma    Bipolar disorder (HCC)    Cardiac arrest (HCC) 2020   Carpal tunnel syndrome, bilateral    Chronic back pain    COPD (chronic obstructive pulmonary disease) (HCC)    Depression    Diabetes mellitus without complication (HCC)    Diverticulosis    Fx. left wrist    GERD (gastroesophageal reflux disease)    Heart murmur    "born with"   Heart murmur    HLD (hyperlipidemia)    Hypertension    IBS (irritable bowel syndrome)    Internal hemorrhoids    Muscle spasms of neck    Neuropathy    Pneumonia    PUD (peptic ulcer disease)     Past Surgical History:  Procedure Laterality Date   ARTHRODESIS METATARSALPHALANGEAL JOINT (MTPJ) Right 04/29/2016   Procedure: RIGHT GREAT TOE METATARSOPHALANGEAL JOINT (MTPJ) FUSION, HARDWARE REMOVAL;  Surgeon: Tarry Kos, MD;  Location: [facility];  Service: Orthopedics;  Laterality: Right;  RIGHT GREAT TOE METATARSOPHALANGEAL JOINT (MTPJ) FUSION, HARDWARE REMOVAL   CARPAL TUNNEL RELEASE Right 2003   CHOLECYSTECTOMY N/A 02/09/2014   Procedure: LAPAROSCOPIC CHOLECYSTECTOMY WITH INTRAOPERATIVE CHOLANGIOGRAM;  Surgeon: Valarie Merino, MD;  Location: [facility];  Service: General;  Laterality: N/A;   CHOLECYSTECTOMY     FOOT SURGERY     HAMMER TOE FUSION Bilateral 2008   HARDWARE REMOVAL Right 04/29/2016   Procedure: HARDWARE REMOVAL;  Surgeon: Tarry Kos, MD;  Location: [facility];  Service: Orthopedics;  Laterality: Right;  HARDWARE REMOVAL   LEFT HEART CATH AND CORONARY ANGIOGRAPHY N/A 08/30/2018   Procedure: LEFT HEART CATH AND CORONARY ANGIOGRAPHY;  Surgeon: Marykay Lex, MD;  Location: [facility] INVASIVE CV LAB;  Service: Cardiovascular;  Laterality: N/A;   ROTATOR CUFF REPAIR Right 2011   WRIST FRACTURE SURGERY Left     Family History  Problem Relation Age of Onset   Breast cancer Paternal Grandmother    CAD Father    Diabetes Mellitus II Father    Heart failure Father    Diabetes Father    Pancreatic cancer Mother    HIV/AIDS Brother    Other Sister        Brain tumor   Colon cancer Neg Hx    Social History:  reports that she has quit smoking. Her smoking use included cigarettes. She has never been exposed to tobacco smoke. She has never used smokeless tobacco. She reports that she does not currently use alcohol. She reports that she  does not currently use drugs after having used the following drugs: Cocaine, "Crack" cocaine, and Marijuana.  Allergies:  Allergies  Allergen Reactions   Penicillins Anaphylaxis    Has patient had a PCN reaction causing immediate rash, facial/tongue/throat swelling, SOB or lightheadedness with hypotension: Yes Has patient had a PCN reaction causing severe rash involving mucus membranes or skin necrosis: Yes Has patient had a PCN reaction that required hospitalization Yes Has patient had a PCN reaction occurring within the last 10 years: No-more than 10 years ago If all of the above answers are "NO", then may proceed with Cephalosporin use.    Aspirin Rash    No medications prior to admission.    Results for orders placed or performed during the  hospital encounter of 02/02/23 (from the past 48 hour(s))  Glucose, capillary     Status: Abnormal   Collection Time: 02/02/23 10:57 AM  Result Value Ref Range   Glucose-Capillary 103 (H) 70 - 99 mg/dL    Comment: Glucose reference range applies only to samples taken after fasting for at least 8 hours.  Basic metabolic panel per protocol     Status: Abnormal   Collection Time: 02/02/23 11:01 AM  Result Value Ref Range   Sodium 138 135 - 145 mmol/L   Potassium 4.2 3.5 - 5.1 mmol/L   Chloride 103 98 - 111 mmol/L   CO2 25 22 - 32 mmol/L   Glucose, Bld 90 70 - 99 mg/dL    Comment: Glucose reference range applies only to samples taken after fasting for at least 8 hours.   BUN 16 6 - 20 mg/dL   Creatinine, Ser 1.61 (H) 0.44 - 1.00 mg/dL   Calcium 9.1 8.9 - 09.6 mg/dL   GFR, Estimated >04 >54 mL/min    Comment: (NOTE) Calculated using the CKD-EPI Creatinine Equation (2021)    Anion gap 10 5 - 15    Comment: Performed at [facility], [address], [place], [place] [address]  CBC per protocol     Status: None   Collection Time: 02/02/23 11:01 AM  Result Value Ref Range   WBC 7.3 4.0 - 10.5 K/uL   RBC 3.97 3.87 - 5.11 MIL/uL   Hemoglobin 12.9 12.0 - 15.0 g/dL   HCT 91.4 78.2 - 95.6 %   MCV 97.7 80.0 - 100.0 fL   MCH 32.5 26.0 - 34.0 pg   MCHC 33.2 30.0 - 36.0 g/dL   RDW 21.3 08.6 - 57.8 %   Platelets 267 150 - 400 K/uL   nRBC 0.0 0.0 - 0.2 %    Comment: Performed at [facility], [address], [place], [place] [address]  Hemoglobin A1c     Status: None   Collection Time: 02/02/23 11:01 AM  Result Value Ref Range   Hgb A1c MFr Bld 4.8 4.8 - 5.6 %    Comment: (NOTE) Pre diabetes:          5.7%-6.4%  Diabetes:              >6.4%  Glycemic control for   <7.0% adults with diabetes    Mean Plasma Glucose 91.06 mg/dL    Comment: Performed at [facility], [address] [place], [place] [address]   No results found.  Review of  Systems  Constitutional:  Positive for diaphoresis.  HENT:  Positive for sinus pressure.   Eyes: Negative.        Blurred vision  Respiratory: Negative.  Cardiovascular:  Positive for chest pain.       Heart murmur, HTN  Gastrointestinal:  Positive for abdominal pain, constipation and diarrhea.  Endocrine: Negative.   Genitourinary: Negative.   Musculoskeletal:  Positive for arthralgias and myalgias.  Skin: Negative.   Neurological: Negative.   Hematological: Negative.   Psychiatric/Behavioral:         Depression    Last menstrual period 07/29/2018. Physical Exam Constitutional:      Appearance: Normal appearance. She is normal weight.  HENT:     Head: Normocephalic and atraumatic.     Nose: Nose normal.  Eyes:     Pupils: Pupils are equal, round, and reactive to light.  Cardiovascular:     Pulses: Normal pulses.  Pulmonary:     Effort: Pulmonary effort is normal.  Musculoskeletal:        General: Swelling and tenderness present.     Cervical back: Normal range of motion and neck supple.     Comments: Patient is right knee has a 1-2+ effusion of palpation tender along the anteromedial joint line and the anterior medial aspect of the femur.  Skin:    General: Skin is warm and dry.  Neurological:     General: No focal deficit present.     Mental Status: She is alert and oriented to person, place, and time. Mental status is at baseline.  Psychiatric:        Mood and Affect: Mood normal.        Behavior: Behavior normal.        Thought Content: Thought content normal.        Judgment: Judgment normal.      Assessment/Plan Assess: Symptomatic loose body in right knee and the patient is on fairly high doses of oxycodone for pain management for her back.  Plan: Risks and benefits of arthroscopic surgery discussed at length we will get this arranged for as an outpatient and if all possible.  Because of the high doses of opioid that she is taking we will seek preoperative  clearance from her pain management specialist.  Dannielle Burn, PA-C 02/03/2023, 9:04 AM

## 2023-02-04 NOTE — Progress Notes (Signed)
Case: [id number] Date/Time: 02/08/23 1603   Procedure: RIGHT KNEE ARTHROSCOPY (Right: Knee)   Anesthesia type: Choice   Pre-op diagnosis: RIGHT KNEE LOOSE BODY   Location: [facility] ROOM 10 / [facility]   Surgeons: Gean Birchwood, MD       DISCUSSION: Amanda [name] is a 57 year old female who presents to PAT prior to surgery listed above.  Past medical history significant for former smoking, hx of polysubstance abuse (alcohol, crack cocaine), asthma/COPD, diabetes, anxiety/depression, GERD, bipolar disorder, heart murmur, history of cardiac arrest in 2020, hypertension.  Patient had PEA arrest on 08/25/2018.  She required 5 minutes of CPR and a dose of epinephrine.  EKG was sinus rhythm with nonspecific ST changes.  CTA of her chest showed a multifocal pneumonia and urine drug screen was positive for cocaine.  Troponins were elevated and she went to the Cath Lab which showed mild coronary artery disease in the RCA. The cause was ultimately contributed to respiratory arrest and cocaine use. Since then she follows with Dr. Sharyn Lull for her heart.  She was last seen on 01/05/2023 (paper copy of last OV note and clearance in chart).  She has chronic chest pain and takes Imdur. She was cleared for orthopedic surgery:  "Follow-up with PMD and orthopedics as scheduled.  Patient is acceptable risk for right total knee replacement from cardiac point of view."  Patient also follows with pulmonology for her asthma and COPD.  She had an exacerbation which was attributed to pollen in March 2024 and was on a prednisone taper.  Patient was reevaluated on 12/23/2022 and patient was noted to be recovered and patient was cleared from a pulmonary standpoint:  "For Ms. Cerros, risk of perioperative pulmonary complications is: ARISCAT - LOW RISK 1.6% post-op pulmonary risks. Overall, I recommend proceeding with the surgery if the risk for respiratory complications are outweighed by the potential benefits. This will need to be discussed  between the patient and surgeon. To reduce risks of respiratory complications, I recommend: --Pre- and post-operative incentive spirometry performed frequently while awake --Avoiding use of pancuronium during anesthesia if able"  BP controlled at PAT visit. HgA1c was 4.8 on 6/11.   VS: BP 130/89   Pulse 77   Temp 36.6 C (Oral)   Ht 5\' 7"  (1.702 m)   LMP 07/29/2018 (Approximate)   SpO2 95%   BMI 37.28 kg/m   PROVIDERS: Pcp, No Cardiologist: Dr. Sharyn Lull Pulmonology: Chi Mechele Collin, MD  LABS: Labs reviewed: Acceptable for surgery. (all labs ordered are listed, but only abnormal results are displayed)  Labs Reviewed  BASIC METABOLIC PANEL - Abnormal; Notable for the following components:      Result Value   Creatinine, Ser 1.04 (*)    All other components within normal limits  GLUCOSE, CAPILLARY - Abnormal; Notable for the following components:   Glucose-Capillary 103 (*)    All other components within normal limits  CBC  HEMOGLOBIN A1C     IMAGES:n/a   EKG 02/21/2021 (will repeat DOS)  NSR Prolonged QT   CV:  Left heart cath 08/30/2018 : LV end diastolic pressure is normal. Prox RCA lesion is 30% stenosed.   SUMMARY Minimal single vessel CAD in prox RCA with evidence of global arterial spasm High normal LVEDP   RECOMMENDATIONS Return to nursing unit after TR band removal in PACU Holding Area Have added Imdur 30 mg (for BP control would increase Amlodipine as 1st option). Defer further management to primary team.  Echo 08/27/2018:  Study Conclusions   -  Left ventricle: The cavity size was normal. Wall thickness was    increased in a pattern of mild LVH. Systolic function was    vigorous. The estimated ejection fraction was in the range of 65%    to 70%. Wall motion was normal; there were no regional wall    motion abnormalities. Doppler parameters are consistent with    abnormal left ventricular relaxation (grade 1 diastolic    dysfunction).  -  Aortic valve: Trileaflet; mildly thickened, mildly calcified    leaflets.  - Mitral valve: Mildly calcified annulus.  - Pulmonary arteries: Systolic pressure was mildly increased. PA    peak pressure: 36 mm Hg (S).      Past Medical History:  Diagnosis Date   Anginal pain (HCC)    Anxiety    Arthritis    lower back   Asthma    Bipolar disorder (HCC)    Cardiac arrest (HCC) 2020   Carpal tunnel syndrome, bilateral    Chronic back pain    COPD (chronic obstructive pulmonary disease) (HCC)    Depression    Diabetes mellitus without complication (HCC)    Diverticulosis    Fx. left wrist    GERD (gastroesophageal reflux disease)    Heart murmur    "born with"   Heart murmur    HLD (hyperlipidemia)    Hypertension    IBS (irritable bowel syndrome)    Internal hemorrhoids    Muscle spasms of neck    Neuropathy    Pneumonia    PUD (peptic ulcer disease)     Past Surgical History:  Procedure Laterality Date   ARTHRODESIS METATARSALPHALANGEAL JOINT (MTPJ) Right 04/29/2016   Procedure: RIGHT GREAT TOE METATARSOPHALANGEAL JOINT (MTPJ) FUSION, HARDWARE REMOVAL;  Surgeon: Tarry Kos, MD;  Location: [facility];  Service: Orthopedics;  Laterality: Right;  RIGHT GREAT TOE METATARSOPHALANGEAL JOINT (MTPJ) FUSION, HARDWARE REMOVAL   CARPAL TUNNEL RELEASE Right 2003   CHOLECYSTECTOMY N/A 02/09/2014   Procedure: LAPAROSCOPIC CHOLECYSTECTOMY WITH INTRAOPERATIVE CHOLANGIOGRAM;  Surgeon: Valarie Merino, MD;  Location: [facility];  Service: General;  Laterality: N/A;   CHOLECYSTECTOMY     FOOT SURGERY     HAMMER TOE FUSION Bilateral 2008   HARDWARE REMOVAL Right 04/29/2016   Procedure: HARDWARE REMOVAL;  Surgeon: Tarry Kos, MD;  Location: [facility];  Service: Orthopedics;  Laterality: Right;  HARDWARE REMOVAL   LEFT HEART CATH AND CORONARY ANGIOGRAPHY N/A 08/30/2018   Procedure: LEFT HEART CATH AND CORONARY ANGIOGRAPHY;  Surgeon: Marykay Lex, MD;  Location:  [facility] INVASIVE CV LAB;  Service: Cardiovascular;  Laterality: N/A;   ROTATOR CUFF REPAIR Right 2011   WRIST FRACTURE SURGERY Left     MEDICATIONS:  albuterol (VENTOLIN HFA) 108 (90 Base) MCG/ACT inhaler   ALPRAZolam (XANAX) 0.5 MG tablet   amLODipine (NORVASC) 5 MG tablet   atorvastatin (LIPITOR) 10 MG tablet   Buprenorphine HCl 600 MCG FILM   buPROPion (WELLBUTRIN XL) 300 MG 24 hr tablet   cetirizine (ZYRTEC) 10 MG tablet   dicyclomine (BENTYL) 20 MG tablet   famotidine (PEPCID) 20 MG tablet   FARXIGA 10 MG TABS tablet   fluticasone-salmeterol (ADVAIR HFA) 230-21 MCG/ACT inhaler   gabapentin (NEURONTIN) 300 MG capsule   hydrOXYzine (ATARAX/VISTARIL) 50 MG tablet   ipratropium (ATROVENT) 0.03 % nasal spray   isosorbide mononitrate (IMDUR) 120 MG 24 hr tablet   linaclotide (LINZESS) 145 MCG CAPS capsule   MELATONIN PO   Mepolizumab (NUCALA) 100  MG/ML SOAJ   metoprolol tartrate (LOPRESSOR) 50 MG tablet   montelukast (SINGULAIR) 10 MG tablet   nitroGLYCERIN (NITROSTAT) 0.4 MG SL tablet   omeprazole (PRILOSEC) 20 MG capsule   ondansetron (ZOFRAN) 4 MG tablet   oxyCODONE-acetaminophen (PERCOCET) 10-325 MG tablet   paliperidone (INVEGA) 6 MG 24 hr tablet   trazodone (DESYREL) 300 MG tablet   No current facility-administered medications for this encounter.   Marcille Blanco [facility] Short Stay/Anesthesiology [facility] Phone [phone] 02/04/2023 9:57 AM

## 2023-02-08 ENCOUNTER — Encounter (HOSPITAL_COMMUNITY)
Admission: RE | Disposition: A | Discharge status: Home / Self Care | Payer: 59 | Source: Home / Self Care | Attending: Orthopedic Surgery

## 2023-02-08 ENCOUNTER — Encounter (HOSPITAL_COMMUNITY): Payer: Self-pay | Admitting: Orthopedic Surgery

## 2023-02-08 ENCOUNTER — Ambulatory Visit (HOSPITAL_COMMUNITY)
Admission: RE | Admit: 2023-02-08 | Discharge: 2023-02-08 | Disposition: A | Discharge status: Home / Self Care | Payer: 59 | Attending: Orthopedic Surgery | Admitting: Orthopedic Surgery

## 2023-02-08 ENCOUNTER — Ambulatory Visit (HOSPITAL_BASED_OUTPATIENT_CLINIC_OR_DEPARTMENT_OTHER): Payer: 59 | Admitting: Anesthesiology

## 2023-02-08 ENCOUNTER — Ambulatory Visit (HOSPITAL_COMMUNITY): Payer: 59 | Admitting: Medical

## 2023-02-08 DIAGNOSIS — S72433A Displaced fracture of medial condyle of unspecified femur, initial encounter for closed fracture: Secondary | ICD-10-CM

## 2023-02-08 DIAGNOSIS — Z87891 Personal history of nicotine dependence: Secondary | ICD-10-CM

## 2023-02-08 DIAGNOSIS — I10 Essential (primary) hypertension: Secondary | ICD-10-CM

## 2023-02-08 DIAGNOSIS — Z79899 Other long term (current) drug therapy: Secondary | ICD-10-CM | POA: Insufficient documentation

## 2023-02-08 DIAGNOSIS — Z7984 Long term (current) use of oral hypoglycemic drugs: Secondary | ICD-10-CM | POA: Insufficient documentation

## 2023-02-08 DIAGNOSIS — S83281A Other tear of lateral meniscus, current injury, right knee, initial encounter: Secondary | ICD-10-CM

## 2023-02-08 DIAGNOSIS — S83241A Other tear of medial meniscus, current injury, right knee, initial encounter: Secondary | ICD-10-CM | POA: Insufficient documentation

## 2023-02-08 DIAGNOSIS — K219 Gastro-esophageal reflux disease without esophagitis: Secondary | ICD-10-CM | POA: Diagnosis not present

## 2023-02-08 DIAGNOSIS — J4489 Other specified chronic obstructive pulmonary disease: Secondary | ICD-10-CM | POA: Diagnosis not present

## 2023-02-08 DIAGNOSIS — M94261 Chondromalacia, right knee: Secondary | ICD-10-CM | POA: Diagnosis not present

## 2023-02-08 DIAGNOSIS — I252 Old myocardial infarction: Secondary | ICD-10-CM | POA: Diagnosis not present

## 2023-02-08 DIAGNOSIS — X58XXXA Exposure to other specified factors, initial encounter: Secondary | ICD-10-CM | POA: Insufficient documentation

## 2023-02-08 DIAGNOSIS — M2341 Loose body in knee, right knee: Secondary | ICD-10-CM | POA: Diagnosis present

## 2023-02-08 DIAGNOSIS — E119 Type 2 diabetes mellitus without complications: Secondary | ICD-10-CM | POA: Diagnosis not present

## 2023-02-08 DIAGNOSIS — J449 Chronic obstructive pulmonary disease, unspecified: Secondary | ICD-10-CM

## 2023-02-08 HISTORY — PX: KNEE ARTHROSCOPY: SHX127

## 2023-02-08 LAB — GLUCOSE, CAPILLARY: Glucose-Capillary: 103 mg/dL — ABNORMAL HIGH (ref 70–99)

## 2023-02-08 SURGERY — ARTHROSCOPY, KNEE
Anesthesia: General | Site: Knee | Laterality: Right

## 2023-02-08 MED ORDER — LIDOCAINE 2% (20 MG/ML) 5 ML SYRINGE
INTRAMUSCULAR | Status: DC | PRN
  Administered 2023-02-08: 100 mg via INTRAVENOUS

## 2023-02-08 MED ORDER — TRANEXAMIC ACID-NACL 1000-0.7 MG/100ML-% IV SOLN
1000.0000 mg | INTRAVENOUS | Status: AC
  Administered 2023-02-08: 1000 mg via INTRAVENOUS
  Filled 2023-02-08: qty 100

## 2023-02-08 MED ORDER — DEXMEDETOMIDINE HCL IN NACL 80 MCG/20ML IV SOLN
INTRAVENOUS | Status: DC | PRN
  Administered 2023-02-08 (×5): 4 ug via INTRAVENOUS

## 2023-02-08 MED ORDER — OXYCODONE HCL 5 MG PO TABS
ORAL_TABLET | ORAL | Status: AC
  Filled 2023-02-08: qty 1

## 2023-02-08 MED ORDER — FENTANYL CITRATE (PF) 250 MCG/5ML IJ SOLN
INTRAMUSCULAR | Status: AC
  Filled 2023-02-08: qty 5

## 2023-02-08 MED ORDER — ORAL CARE MOUTH RINSE: 15.0000 mL | Freq: Once | OROMUCOSAL | Status: AC

## 2023-02-08 MED ORDER — DEXAMETHASONE SODIUM PHOSPHATE 10 MG/ML IJ SOLN
INTRAMUSCULAR | Status: DC | PRN
  Administered 2023-02-08: 10 mg via INTRAVENOUS

## 2023-02-08 MED ORDER — DEXMEDETOMIDINE HCL IN NACL 80 MCG/20ML IV SOLN
INTRAVENOUS | Status: AC
  Filled 2023-02-08: qty 20

## 2023-02-08 MED ORDER — EPINEPHRINE PF 1 MG/ML IJ SOLN
INTRAMUSCULAR | Status: AC
  Filled 2023-02-08: qty 2

## 2023-02-08 MED ORDER — ONDANSETRON HCL 4 MG/2ML IJ SOLN
INTRAMUSCULAR | Status: DC | PRN
  Administered 2023-02-08: 4 mg via INTRAVENOUS

## 2023-02-08 MED ORDER — ACETAMINOPHEN 500 MG PO TABS
1000.0000 mg | ORAL_TABLET | Freq: Once | ORAL | Status: AC
  Administered 2023-02-08: 1000 mg via ORAL
  Filled 2023-02-08: qty 2

## 2023-02-08 MED ORDER — LACTATED RINGERS IV SOLN: INTRAVENOUS | Status: DC

## 2023-02-08 MED ORDER — BUPIVACAINE HCL (PF) 0.5 % IJ SOLN
INTRAMUSCULAR | Status: AC
  Filled 2023-02-08: qty 30

## 2023-02-08 MED ORDER — OXYCODONE HCL 5 MG PO TABS
5.0000 mg | ORAL_TABLET | ORAL | Status: DC | PRN
  Administered 2023-02-08: 5 mg via ORAL

## 2023-02-08 MED ORDER — VANCOMYCIN HCL IN DEXTROSE 1-5 GM/200ML-% IV SOLN
1000.0000 mg | Freq: Once | INTRAVENOUS | Status: AC
  Administered 2023-02-08: 1000 mg via INTRAVENOUS
  Filled 2023-02-08: qty 200

## 2023-02-08 MED ORDER — LIDOCAINE HCL (PF) 2 % IJ SOLN
INTRAMUSCULAR | Status: AC
  Filled 2023-02-08: qty 15

## 2023-02-08 MED ORDER — MIDAZOLAM HCL 5 MG/5ML IJ SOLN
INTRAMUSCULAR | Status: DC | PRN
  Administered 2023-02-08: 2 mg via INTRAVENOUS

## 2023-02-08 MED ORDER — BUPIVACAINE HCL (PF) 0.5 % IJ SOLN
INTRAMUSCULAR | Status: DC | PRN
  Administered 2023-02-08: 30 mL

## 2023-02-08 MED ORDER — EPINEPHRINE PF 1 MG/ML IJ SOLN
INTRAMUSCULAR | Status: DC | PRN
  Administered 2023-02-08: 1 mg

## 2023-02-08 MED ORDER — TRANEXAMIC ACID 1000 MG/10ML IV SOLN
2000.0000 mg | INTRAVENOUS | Status: DC
  Filled 2023-02-08: qty 20

## 2023-02-08 MED ORDER — SODIUM CHLORIDE 0.9 % IR SOLN
Status: DC | PRN
  Administered 2023-02-08 (×2): 3000 mL

## 2023-02-08 MED ORDER — PROPOFOL 10 MG/ML IV BOLUS
INTRAVENOUS | Status: AC
  Filled 2023-02-08: qty 20

## 2023-02-08 MED ORDER — CEFAZOLIN SODIUM-DEXTROSE 2-4 GM/100ML-% IV SOLN
2.0000 g | INTRAVENOUS | Status: DC
  Filled 2023-02-08: qty 100

## 2023-02-08 MED ORDER — GLYCOPYRROLATE 0.2 MG/ML IJ SOLN
INTRAMUSCULAR | Status: DC | PRN
  Administered 2023-02-08: .1 mg via INTRAVENOUS

## 2023-02-08 MED ORDER — HYDROMORPHONE HCL 1 MG/ML IJ SOLN
0.2500 mg | INTRAMUSCULAR | Status: DC | PRN
  Administered 2023-02-08: 0.5 mg via INTRAVENOUS

## 2023-02-08 MED ORDER — MIDAZOLAM HCL 2 MG/2ML IJ SOLN
INTRAMUSCULAR | Status: AC
  Filled 2023-02-08: qty 2

## 2023-02-08 MED ORDER — FENTANYL CITRATE (PF) 100 MCG/2ML IJ SOLN
INTRAMUSCULAR | Status: DC | PRN
  Administered 2023-02-08 (×4): 50 ug via INTRAVENOUS

## 2023-02-08 MED ORDER — HYDROMORPHONE HCL 1 MG/ML IJ SOLN
INTRAMUSCULAR | Status: AC
  Filled 2023-02-08: qty 1

## 2023-02-08 MED ORDER — CHLORHEXIDINE GLUCONATE 0.12 % MT SOLN
15.0000 mL | Freq: Once | OROMUCOSAL | Status: AC
  Administered 2023-02-08: 15 mL via OROMUCOSAL

## 2023-02-08 MED ORDER — PROPOFOL 10 MG/ML IV BOLUS
INTRAVENOUS | Status: DC | PRN
  Administered 2023-02-08: 200 mg via INTRAVENOUS

## 2023-02-08 SURGICAL SUPPLY — 46 items
APL PRP STRL LF DISP 70% ISPRP (MISCELLANEOUS) ×1
BAG COUNTER SPONGE SURGICOUNT (BAG) IMPLANT
BAG SPEC THK2 15X12 ZIP CLS (MISCELLANEOUS) ×1
BAG SPNG CNTER NS LX DISP (BAG)
BAG ZIPLOCK 12X15 (MISCELLANEOUS) ×1 IMPLANT
BLADE SURG SZ11 CARB STEEL (BLADE) IMPLANT
BNDG CMPR 5X62 HK CLSR LF (GAUZE/BANDAGES/DRESSINGS)
BNDG CMPR MED 10X6 ELC LF (GAUZE/BANDAGES/DRESSINGS) ×1
BNDG ELASTIC 6INX 5YD STR LF (GAUZE/BANDAGES/DRESSINGS) IMPLANT
BNDG ELASTIC 6X10 VLCR STRL LF (GAUZE/BANDAGES/DRESSINGS) IMPLANT
CHLORAPREP W/TINT 26 (MISCELLANEOUS) ×1 IMPLANT
COVER SURGICAL LIGHT HANDLE (MISCELLANEOUS) ×1 IMPLANT
DISSECTOR 3.8MM X 13CM (MISCELLANEOUS) IMPLANT
DISSECTOR 4.0MM X 13CM (MISCELLANEOUS) ×1 IMPLANT
DRAPE FOOT SWITCH (DRAPES) ×1 IMPLANT
DRAPE SHEET LG 3/4 BI-LAMINATE (DRAPES) ×1 IMPLANT
GAUZE 4X4 16PLY ~~LOC~~+RFID DBL (SPONGE) ×1 IMPLANT
GAUZE PAD ABD 7.5X8 STRL (GAUZE/BANDAGES/DRESSINGS) IMPLANT
GAUZE PAD ABD 8X10 STRL (GAUZE/BANDAGES/DRESSINGS) ×1 IMPLANT
GAUZE SPONGE 4X4 12PLY STRL (GAUZE/BANDAGES/DRESSINGS) ×1 IMPLANT
GAUZE XEROFORM 1X8 LF (GAUZE/BANDAGES/DRESSINGS) ×1 IMPLANT
GLOVE BIO SURGEON STRL SZ7.5 (GLOVE) ×1 IMPLANT
GLOVE BIO SURGEON STRL SZ8.5 (GLOVE) ×1 IMPLANT
GLOVE BIOGEL PI IND STRL 8 (GLOVE) ×1 IMPLANT
GLOVE BIOGEL PI IND STRL 9 (GLOVE) ×1 IMPLANT
GOWN STRL REUS W/ TWL XL LVL3 (GOWN DISPOSABLE) ×2 IMPLANT
GOWN STRL REUS W/TWL XL LVL3 (GOWN DISPOSABLE) ×2
IRRIG SUCT STRYKERFLOW 2 WTIP (MISCELLANEOUS) ×1
IRRIGATION SUCT STRKRFLW 2 WTP (MISCELLANEOUS) ×1 IMPLANT
KIT BASIN OR (CUSTOM PROCEDURE TRAY) IMPLANT
KIT TURNOVER KIT A (KITS) IMPLANT
MANIFOLD NEPTUNE II (INSTRUMENTS) ×1 IMPLANT
NDL HYPO 22X1.5 SAFETY MO (MISCELLANEOUS) ×1 IMPLANT
NDL SAFETY ECLIP 18X1.5 (MISCELLANEOUS) ×1 IMPLANT
NDL SPNL 18GX3.5 QUINCKE PK (NEEDLE) ×1 IMPLANT
NEEDLE HYPO 22X1.5 SAFETY MO (MISCELLANEOUS) ×1 IMPLANT
NEEDLE SPNL 18GX3.5 QUINCKE PK (NEEDLE) ×1 IMPLANT
PACK ARTHROSCOPY WL (CUSTOM PROCEDURE TRAY) ×1 IMPLANT
PADDING CAST COTTON 6X4 STRL (CAST SUPPLIES) ×1 IMPLANT
PROTECTOR NERVE ULNAR (MISCELLANEOUS) ×1 IMPLANT
SPONGE T-LAP 4X18 ~~LOC~~+RFID (SPONGE) ×1 IMPLANT
SYR 20ML LL LF (SYRINGE) ×1 IMPLANT
SYR BULB IRRIG 60ML STRL (SYRINGE) ×1 IMPLANT
TOWEL OR 17X26 10 PK STRL BLUE (TOWEL DISPOSABLE) ×1 IMPLANT
TUBING ARTHROSCOPY IRRIG 16FT (MISCELLANEOUS) ×1 IMPLANT
WRAP KNEE MAXI GEL POST OP (GAUZE/BANDAGES/DRESSINGS) ×1 IMPLANT

## 2023-02-08 NOTE — Anesthesia Procedure Notes (Signed)
Procedure Name: LMA Insertion Date/Time: 02/08/2023 4:17 PM  Performed by: Marny Lowenstein, CRNAPre-anesthesia Checklist: Patient identified, Emergency Drugs available, Suction available and Patient being monitored Patient Re-evaluated:Patient Re-evaluated prior to induction Oxygen Delivery Method: Circle system utilized Preoxygenation: Pre-oxygenation with 100% oxygen Induction Type: IV induction Ventilation: Mask ventilation without difficulty LMA: LMA with gastric port inserted LMA Size: 4.0 Number of attempts: 1 Placement Confirmation: positive ETCO2 and breath sounds checked- equal and bilateral Tube secured with: Tape Dental Injury: Teeth and Oropharynx as per pre-operative assessment

## 2023-02-08 NOTE — Anesthesia Postprocedure Evaluation (Signed)
Anesthesia Post Note  Patient: Amanda [name]  Procedure(s) Performed: RIGHT KNEE ARTHROSCOPY (Right: Knee)     Patient location during evaluation: PACU Anesthesia Type: General Level of consciousness: awake and alert Pain management: pain level controlled Vital Signs Assessment: post-procedure vital signs reviewed and stable Respiratory status: spontaneous breathing, nonlabored ventilation and respiratory function stable Cardiovascular status: blood pressure returned to baseline and stable Postop Assessment: no apparent nausea or vomiting Anesthetic complications: no  No notable events documented.  Last Vitals:  Vitals:   02/08/23 1740 02/08/23 1750  BP: (!) 130/90 (!) 133/90  Pulse: 79 82  Resp: 15   Temp: 36.6 C 36.9 C  SpO2: 96% 98%    Last Pain:  Vitals:   02/08/23 1750  TempSrc: Temporal  PainSc: 5                  Sharyah Bostwick,W. EDMOND

## 2023-02-08 NOTE — Transfer of Care (Signed)
Immediate Anesthesia Transfer of Care Note  Patient: Amanda [name]  Procedure(s) Performed: RIGHT KNEE ARTHROSCOPY (Right: Knee)  Patient Location: PACU  Anesthesia Type:General  Level of Consciousness: drowsy  Airway & Oxygen Therapy: Patient Spontanous Breathing and Patient connected to face mask oxygen  Post-op Assessment: Report given to RN and Post -op Vital signs reviewed and stable  Post vital signs: Reviewed and stable  Last Vitals:  Vitals Value Taken Time  BP 98/62 02/08/23 1706  Temp    Pulse 72 02/08/23 1710  Resp 12 02/08/23 1710  SpO2 100 % 02/08/23 1710  Vitals shown include unvalidated device data.  Last Pain:  Vitals:   02/08/23 1457  TempSrc: Oral  PainSc: 9       Patients Stated Pain Goal: 0 (02/08/23 1457)  Complications: No notable events documented.

## 2023-02-08 NOTE — Anesthesia Preprocedure Evaluation (Addendum)
Anesthesia Evaluation  Patient identified by MRN, date of birth, ID band Patient awake    Reviewed: Allergy & Precautions, H&P , NPO status , Patient's Chart, lab work & pertinent test results  Airway Mallampati: II  TM Distance: >3 FB Neck ROM: Full    Dental no notable dental hx.    Pulmonary asthma , COPD,  COPD inhaler, former smoker   Pulmonary exam normal breath sounds clear to auscultation       Cardiovascular hypertension, Pt. on medications and Pt. on home beta blockers + Past MI  Normal cardiovascular exam Rhythm:Regular Rate:Normal     Neuro/Psych  Headaches PSYCHIATRIC DISORDERS Anxiety Depression Bipolar Disorder    Neuromuscular disease    GI/Hepatic Neg liver ROS, PUD,GERD  Medicated,,  Endo/Other  diabetes, Type 2, Oral Hypoglycemic Agents    Renal/GU negative Renal ROS  negative genitourinary   Musculoskeletal  (+) Arthritis , Osteoarthritis,    Abdominal   Peds  Hematology negative hematology ROS (+)   Anesthesia Other Findings   Reproductive/Obstetrics negative OB ROS                             Anesthesia Physical Anesthesia Plan  ASA: 3  Anesthesia Plan: General   Post-op Pain Management: Tylenol PO (pre-op)* and Celebrex PO (pre-op)*   Induction: Intravenous  PONV Risk Score and Plan: 4 or greater and Ondansetron, Dexamethasone, Propofol infusion, TIVA and Midazolam  Airway Management Planned: LMA  Additional Equipment:   Intra-op Plan:   Post-operative Plan: Extubation in OR  Informed Consent: I have reviewed the patients History and Physical, chart, labs and discussed the procedure including the risks, benefits and alternatives for the proposed anesthesia with the patient or authorized representative who has indicated his/her understanding and acceptance.     Dental advisory given  Plan Discussed with: CRNA  Anesthesia Plan Comments: (DISCUSSION:  Amanda [name] is a 57 year old female who presents to PAT prior to surgery listed above.  Past medical history significant for former smoking, hx of polysubstance abuse (alcohol, crack cocaine), asthma/COPD, diabetes, anxiety/depression, GERD, bipolar disorder, heart murmur, history of cardiac arrest in 2020, hypertension.   Patient had PEA arrest on 08/25/2018.  She required 5 minutes of CPR and a dose of epinephrine.  EKG was sinus rhythm with nonspecific ST changes.  CTA of her chest showed a multifocal pneumonia and urine drug screen was positive for cocaine.  Troponins were elevated and she went to the Cath Lab which showed mild coronary artery disease in the RCA. The cause was ultimately contributed to respiratory arrest and cocaine use. Since then she follows with Dr. Sharyn Lull for her heart.  She was last seen on 01/05/2023 (paper copy of last OV note and clearance in chart).  She has chronic chest pain and takes Imdur. She was cleared for orthopedic surgery:   "Follow-up with PMD and orthopedics as scheduled.  Patient is acceptable risk for right total knee replacement from cardiac point of view."   Patient also follows with pulmonology for her asthma and COPD.  She had an exacerbation which was attributed to pollen in March 2024 and was on a prednisone taper.  Patient was reevaluated on 12/23/2022 and patient was noted to be recovered and patient was cleared from a pulmonary standpoint:   "For Amanda [name], risk of perioperative pulmonary complications is: ARISCAT - LOW RISK 1.6% post-op pulmonary risks. Overall, I recommend proceeding with the surgery if the risk for  respiratory complications are outweighed by the potential benefits. This will need to be discussed between the patient and surgeon. To reduce risks of respiratory complications, I recommend: --Pre- and post-operative incentive spirometry performed frequently while awake --Avoiding use of pancuronium during anesthesia if able"   BP  controlled at PAT visit. HgA1c was 4.8 on 6/11.   Left heart cath 08/30/2018 :  LV end diastolic pressure is normal.  Prox RCA lesion is 30% stenosed.   SUMMARY  Minimal single vessel CAD in prox RCA with evidence of global arterial spasm  High normal LVEDP   RECOMMENDATIONS  Return to nursing unit after TR band removal in PACU Holding Area  Have added Imdur 30 mg (for BP control would increase Amlodipine as 1st option).  Defer further management to primary team.   Echo 08/27/2018:   Study Conclusions   - Left ventricle: The cavity size was normal. Wall thickness was    increased in a pattern of mild LVH. Systolic function was    vigorous. The estimated ejection fraction was in the range of 65%    to 70%. Wall motion was normal; there were no regional wall    motion abnormalities. Doppler parameters are consistent with    abnormal left ventricular relaxation (grade 1 diastolic    dysfunction).  - Aortic valve: Trileaflet; mildly thickened, mildly calcified    leaflets.  - Mitral valve: Mildly calcified annulus.  - Pulmonary arteries: Systolic pressure was mildly increased. PA    peak pressure: 36 mm Hg (S).    )       Anesthesia Quick Evaluation

## 2023-02-08 NOTE — Op Note (Signed)
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  Surgeon: Feliberto Gottron. Turner Daniels M.D.  Assist: Tomi Likens. Gaylene Brooks  (present throughout entire procedure and necessary for timely completion of the procedure) Anes: General LMA  EBL: Minimal  Fluids: 800 cc   Indications: 57 year old woman with catching popping and pain in her right knee over the last year.  She has failed conservative treatment with anti-inflammatory medicines physical therapy cortisone injections provided best temporary relief.  MRI scan shows large loose body or possible flap tear off the medial femoral condyle.  There is also some changes in both menisci consistent with possible small meniscal tears.. Pt has failed conservative treatment with anti-inflammatory medicines, physical therapy, and modified activites but did get good temporarily from an intra-articular cortisone injection. Pain has recurred and patient desires elective arthroscopic evaluation and treatment of knee. Risks and benefits of surgery have been discussed and questions answered.  Procedure: Patient identified by arm band and taken to the operating room at the day surgery Center. The appropriate anesthetic monitors were attached, and General LMA anesthesia was induced without difficulty. Lateral post was applied to the table and the lower extremity was prepped and draped in usual sterile fashion from the ankle to the midthigh. Time out procedure was performed. We began the operation by making standard inferior lateral and inferior medial peripatellar portals with a #11 blade allowing introduction of the arthroscope through the inferior lateral portal and the out flow to the inferior medial  portal. Pump pressure was set at 100 mmHg and diagnostic arthroscopy  revealed grade I-II chondromalacia the patellofemoral joint it was lightly debrided.  Moving to the medial compartment a large medial femoral condyle flap tear at the junction of the distal and posterior quadrants was identified it was base laterally was a centimeter in width and a little over a centimeter in length.  This was debrided back to a stable margin with a 3.8 mm Arthrex dissector.  There was some underlying cartilage that was intact.  The posterior horn of the medial meniscus was also noted to have some tearing and 1 area look like radial tear this was debrided back to a stable margin with the straight small biter and the Arthrex dissector.  Moving into the notch the ACL and the PCL were intact and probed.  Moving laterally there was a posterior horn lateral meniscal tear that again required debridement with the straight biter and the 3.8 mm dissector.  The articular cartilage laterally had grade I-II chondromalacia that was very lightly debrided.  The gutters were cleared medially and laterally and there were some small bits of articular cartilage in the joint fluid that were taken through the outflow. The knee was irrigated out normal saline solution. A dressing of xerofoam 4 x 4 dressing sponges, web roll and an Ace wrap was applied. The patient was awakened extubated and taken to the recovery without difficulty.    Signed: Nestor Lewandowsky, MD

## 2023-02-08 NOTE — Interval H&P Note (Signed)
History and Physical Interval Note:  02/08/2023 3:18 PM  Amanda [name]  has presented today for surgery, with the diagnosis of RIGHT KNEE LOOSE BODY.  The various methods of treatment have been discussed with the patient and family. After consideration of risks, benefits and other options for treatment, the patient has consented to  Procedure(s): RIGHT KNEE ARTHROSCOPY (Right) as a surgical intervention.  The patient's history has been reviewed, patient examined, no change in status, stable for surgery.  I have reviewed the patient's chart and labs.  Questions were answered to the patient's satisfaction.     Nestor Lewandowsky

## 2023-02-09 ENCOUNTER — Encounter (HOSPITAL_COMMUNITY): Payer: Self-pay | Admitting: Orthopedic Surgery

## 2023-02-10 ENCOUNTER — Telehealth (HOSPITAL_BASED_OUTPATIENT_CLINIC_OR_DEPARTMENT_OTHER): Payer: Self-pay | Admitting: Pulmonary Disease

## 2023-02-10 DIAGNOSIS — J4551 Severe persistent asthma with (acute) exacerbation: Secondary | ICD-10-CM

## 2023-02-10 MED ORDER — FLUTICASONE-SALMETEROL 230-21 MCG/ACT IN AERO
2.0000 | INHALATION_SPRAY | Freq: Two times a day (BID) | RESPIRATORY_TRACT | 11 refills | Status: DC
Start: 2023-02-10 — End: 2023-12-14

## 2023-02-10 NOTE — Telephone Encounter (Signed)
Patient states she needs refill of Advair and states pharmacy has been trying to contact us. Patient is completely out. Please advise and call patient with an update.  Pharmacy: [facility] [phone]

## 2023-02-10 NOTE — Telephone Encounter (Signed)
I called and spoke with the pt and notified refill for Advair has been sent to her preferred pharmacy. Nothing further needed.

## 2023-02-23 ENCOUNTER — Other Ambulatory Visit (HOSPITAL_COMMUNITY): Payer: Self-pay

## 2023-02-24 ENCOUNTER — Ambulatory Visit (HOSPITAL_BASED_OUTPATIENT_CLINIC_OR_DEPARTMENT_OTHER): Payer: 59 | Admitting: Pulmonary Disease

## 2023-02-26 ENCOUNTER — Other Ambulatory Visit (HOSPITAL_COMMUNITY): Payer: Self-pay

## 2023-03-02 ENCOUNTER — Other Ambulatory Visit (HOSPITAL_COMMUNITY): Payer: Self-pay

## 2023-03-10 ENCOUNTER — Encounter (HOSPITAL_BASED_OUTPATIENT_CLINIC_OR_DEPARTMENT_OTHER): Payer: Self-pay | Admitting: Pulmonary Disease

## 2023-03-10 ENCOUNTER — Ambulatory Visit (HOSPITAL_BASED_OUTPATIENT_CLINIC_OR_DEPARTMENT_OTHER): Payer: 59 | Admitting: Pulmonary Disease

## 2023-03-10 VITALS — BP 108/60 | HR 81 | Ht 67.0 in | Wt 238.8 lb

## 2023-03-10 DIAGNOSIS — J4551 Severe persistent asthma with (acute) exacerbation: Secondary | ICD-10-CM | POA: Diagnosis not present

## 2023-03-10 MED ORDER — FLUTICASONE-SALMETEROL 500-50 MCG/ACT IN AEPB: 1.0000 | INHALATION_SPRAY | Freq: Two times a day (BID) | RESPIRATORY_TRACT | 0 refills | Status: DC

## 2023-03-10 MED ORDER — PREDNISONE 10 MG PO TABS: ORAL_TABLET | ORAL | 0 refills | Status: AC

## 2023-03-10 MED ORDER — ALBUTEROL SULFATE (2.5 MG/3ML) 0.083% IN NEBU: 2.5000 mg | INHALATION_SOLUTION | Freq: Four times a day (QID) | RESPIRATORY_TRACT | 2 refills | Status: DC | PRN

## 2023-03-10 NOTE — Patient Instructions (Signed)
Severe persistent asthma, currently in exacerbation Asthma-COPD overlap Deconditioning --Prednisone taper as ordered --RESTART Nucala --START Advair Diskus 500 mcg ONE puff in the morning and night for 1 month.  > Then restart Advair 230-21 mcg TWO puff TWICE a day --CONTINUE Albuterol TWO puffs AS NEEDED for shortness of breath --CONTINUE montelukast 10 mg daily --CONTINUE atrovent nasal spray --CONTINUE regular aerobic activity. Goal 10,000 steps a day

## 2023-03-10 NOTE — Progress Notes (Signed)
Subjective:   PATIENT ID: Amanda [name] GENDER: female DOB: June 23, 1966, MRN: [id number]   HPI  Chief Complaint  Patient presents with   Follow-up    Pt states asthma is not controlled. Experiencing wheezing, no chest tightness and a cough. Pt does need refills on inhaler    Reason for Visit: Follow-up for asthma  [name] is a 57 year old female former smoker with asthma/COPD, hypertension, DM2, GERD/PUD history, hx cardiac arrest and chronic back pain who presents for asthma follow-up  Synopsis: She reports since November 2022 with persistent shortness of breath with minimal exertion. Has chronic cough for months at this point. Associated wheezing. She was seen by [facility] and had spirometry completed. She uses albuterol HFA three times a day. Uses nebulizer four times a day. Has nighttime symptoms. Previously tried Trelegy with partial improvement. Denies ever having COVID  PFT 09/02/21 FEV1 54%. No ratio listed Myocardial scan 09/18/21 - Abnormal (Moderate inferolateral and inferior perfusion defect) with low risk of short term to intermediate term CV events. Normal symptomatic and EKG response to stress perfusion.  11/27/21 Since our last visit she was treated for asthma exacerbation and started on Advair HFA 230 strength. Inhaler has partially improved however activity including walking up stairs is tiring and will be shortness of breath, wheezing and coughing. She will use her rescue inhaler twice a day. She has postponed her left open carpal tunnel release due to symptoms. She feels short of breath with short distances.   04/28/22 Since our last visit she reports improved asthma symptoms. She is compliant with her Advair. Rarely uses her Albuterol inhaler. Started Nucala 12/11/21. Has shortness of breath with exertion. Minimal cough and wheezing.  Since our last visit she has not needed steroids for exacerbations.  10/23/22 Since our last visit she has been  compliant with Advair and Nucala. She is compliant Advair and singulair . She is having increased cough and wheezing recently. Believes it is triggered by pollen. Used nebulizer last week but usually uses rarely before this. For her nasal congestion she has not been taking her singulair.  03/10/23 She reports stopping her Nucala for two months, last dose in April because she was doing better. She had surgery for her left knee for meniscus repair. ACT 14 since stopping Nucala and having to use rescue inhaler and nebulizer >3 times a day. She reports increased wheezing and worst at night. Sleeping upright. Associated with shortness of breath and some cough with minimal production.  2023 Jan Feb Mar April May June July Aug Sept Oct Nov Dec     X X  O          2024 Jan Feb Mar April May June July Aug Sept Oct Nov Dec     X    X O       2025 Jan Feb Mar April May June July Aug Sept Oct Nov Dec                O = Nucala started   Asthma Control Test ACT Total Score  03/10/2023  9:46 AM 14  10/23/2022 10:03 AM 16   Social History: Social smoker Chef x 30 years  Past Medical History:  Diagnosis Date   Anginal pain (HCC)    Anxiety    Arthritis    lower back   Asthma    Bipolar disorder (HCC)    Cardiac arrest (HCC) 2020   Carpal tunnel syndrome, bilateral  Chronic back pain    COPD (chronic obstructive pulmonary disease) (HCC)    Depression    Diabetes mellitus without complication (HCC)    Diverticulosis    Fx. left wrist    GERD (gastroesophageal reflux disease)    Heart murmur    "born with"   Heart murmur    HLD (hyperlipidemia)    Hypertension    IBS (irritable bowel syndrome)    Internal hemorrhoids    Muscle spasms of neck    Neuropathy    Pneumonia    PUD (peptic ulcer disease)      Family History  Problem Relation Age of Onset   Breast cancer Paternal Grandmother    CAD Father    Diabetes Mellitus II Father    Heart failure Father    Diabetes Father     Pancreatic cancer Mother    HIV/AIDS Brother    Other Sister        Brain tumor   Colon cancer Neg Hx      Social History   Occupational History   Occupation: Cook    Comment: [employer]  Tobacco Use   Smoking status: Former    Types: Cigarettes    Passive exposure: Never   Smokeless tobacco: Never   Tobacco comments:    Has taking a few puffs of cigarettes in the past. 04/28/2022 Dellis Filbert  Vaping Use   Vaping status: Never Used  Substance and Sexual Activity   Alcohol use: Not Currently    Comment: heavily daily   Drug use: Not Currently    Types: Cocaine, "Crack" cocaine, Marijuana    Comment: last used two years ago   Sexual activity: Not Currently    Partners: Female    Allergies  Allergen Reactions   Penicillins Anaphylaxis    Has patient had a PCN reaction causing immediate rash, facial/tongue/throat swelling, SOB or lightheadedness with hypotension: Yes Has patient had a PCN reaction causing severe rash involving mucus membranes or skin necrosis: Yes Has patient had a PCN reaction that required hospitalization Yes Has patient had a PCN reaction occurring within the last 10 years: No-more than 10 years ago If all of the above answers are "NO", then may proceed with Cephalosporin use.    Aspirin Rash     Outpatient Medications Prior to Visit  Medication Sig Dispense Refill   albuterol (VENTOLIN HFA) 108 (90 Base) MCG/ACT inhaler Inhale 2 puffs into the lungs every 4 (four) hours as needed for wheezing or shortness of breath.     ALPRAZolam (XANAX) 0.5 MG tablet Take 0.5 mg by mouth 2 (two) times daily.     amLODipine (NORVASC) 5 MG tablet Take 1 tablet (5 mg total) by mouth daily. 30 tablet 6   atorvastatin (LIPITOR) 10 MG tablet Take 10 mg by mouth daily.     Buprenorphine HCl 600 MCG FILM Place 600 mcg inside cheek 2 (two) times daily.     buPROPion (WELLBUTRIN XL) 300 MG 24 hr tablet Take 300 mg by mouth daily.     cetirizine (ZYRTEC) 10 MG tablet Take 10 mg by mouth  daily.     dicyclomine (BENTYL) 20 MG tablet Take 1 tablet (20 mg total) by mouth 2 (two) times daily. 20 tablet 0   famotidine (PEPCID) 20 MG tablet Take 20 mg by mouth 2 (two) times daily.     FARXIGA 10 MG TABS tablet Take 10 mg by mouth daily.     fluticasone-salmeterol (ADVAIR HFA) 230-21 MCG/ACT inhaler Inhale  2 puffs into the lungs 2 (two) times daily. 1 each 11   gabapentin (NEURONTIN) 300 MG capsule Take 300 mg by mouth 3 (three) times daily.     hydrOXYzine (ATARAX/VISTARIL) 50 MG tablet Take 50 mg by mouth in the morning, at noon, and at bedtime.     ipratropium (ATROVENT) 0.03 % nasal spray Place 2 sprays into the nose 2 (two) times daily. 30 mL 3   isosorbide mononitrate (IMDUR) 120 MG 24 hr tablet Take 120 mg by mouth daily.     linaclotide (LINZESS) 145 MCG CAPS capsule Take 145 mcg by mouth daily as needed (IBS).     MELATONIN PO Take 1 tablet by mouth at bedtime as needed (sleep).     Mepolizumab (NUCALA) 100 MG/ML SOAJ Inject 1 mL (100 mg total) into the skin every 28 (twenty-eight) days. 1 mL 5   metoprolol tartrate (LOPRESSOR) 50 MG tablet Take 50 mg by mouth 2 (two) times daily.     montelukast (SINGULAIR) 10 MG tablet Take 1 tablet (10 mg total) by mouth at bedtime. 90 tablet 3   nitroGLYCERIN (NITROSTAT) 0.4 MG SL tablet Place 0.4 mg under the tongue every 5 (five) minutes as needed for chest pain.     omeprazole (PRILOSEC) 20 MG capsule Take 20 mg by mouth in the morning and at bedtime.     ondansetron (ZOFRAN) 4 MG tablet Take 1 tablet (4 mg total) by mouth every 8 (eight) hours as needed for nausea or vomiting. 21 tablet 0   oxyCODONE-acetaminophen (PERCOCET) 10-325 MG tablet Take 1 tablet by mouth every 4 (four) hours as needed for pain.     paliperidone (INVEGA) 6 MG 24 hr tablet Take 6 mg by mouth at bedtime.     trazodone (DESYREL) 300 MG tablet Take 300 mg by mouth at bedtime.     No facility-administered medications prior to visit.    Review of Systems   Constitutional:  Negative for chills, diaphoresis, fever, malaise/fatigue and weight loss.  HENT:  Negative for congestion.   Respiratory:  Positive for cough, sputum production, shortness of breath and wheezing. Negative for hemoptysis.   Cardiovascular:  Negative for chest pain, palpitations and leg swelling.     Objective:   Vitals:   03/10/23 0947  BP: 108/60  Pulse: 81  SpO2: 93%  Weight: 238 lb 12.8 oz (108.3 kg)  Height: 5\' 7"  (1.702 m)   SpO2: 93 %  Physical Exam: General: Well-appearing, no acute distress HENT: [place], AT Eyes: EOMI, no scleral icterus Respiratory: Bilaterally wheezing in upper lobes Cardiovascular: RRR, -M/R/G, no JVD Extremities:-Edema,-tenderness Neuro: AAO x4, CNII-XII grossly intact Psych: Normal mood, normal affect  Data Reviewed:  Imaging: CTA 08/25/2018-no pulmonary embolism bilateral groundglass opacities.  No parenchymal abnormalities  PFT: None on file  Labs: CBC    Component Value Date/Time   WBC 7.3 02/02/2023 1101   RBC 3.97 02/02/2023 1101   HGB 12.9 02/02/2023 1101   HGB 11.5 09/13/2018 1528   HCT 38.8 02/02/2023 1101   HCT 32.9 (L) 09/13/2018 1528   PLT 267 02/02/2023 1101   PLT 244 09/13/2018 1528   MCV 97.7 02/02/2023 1101   MCV 98 (H) 09/13/2018 1528   MCH 32.5 02/02/2023 1101   MCHC 33.2 02/02/2023 1101   RDW 12.0 02/02/2023 1101   RDW 12.8 09/13/2018 1528   LYMPHSABS 3.0 11/13/2021 1213   LYMPHSABS 2.2 09/13/2018 1528   MONOABS 0.8 11/13/2021 1213   EOSABS 0.4 11/13/2021 1213  EOSABS 0.2 09/13/2018 1528   BASOSABS 0.1 11/13/2021 1213   BASOSABS 0.1 09/13/2018 1528   Absolute eos  09/13/18 - 200 11/13/21 400    Assessment & Plan:   Discussion: 57 year old female former smoker with asthma/COPD, HTN, DM2, GERD/PUD, hx cardiac arrest and chronic pain who presents for follow-up. Symptoms usually flare in spring/summer. Discussed clinical course and management of asthma including bronchodilator regimen,  preventive care and action plan for exacerbation.  Nucala 11/2021>11/2022. Recurrent symptoms and currently in exacerbation. Restart Nucal 02/2023  Severe persistent asthma, currently in exacerbation Asthma-COPD overlap Deconditioning --Prednisone taper as ordered --RESTART Nucala --START Advair Diskus 500 mcg ONE puff in the morning and night for 1 month.  > Then restart Advair 230-21 mcg TWO puff TWICE a day --CONTINUE Albuterol TWO puffs AS NEEDED for shortness of breath --CONTINUE montelukast 10 mg daily --CONTINUE atrovent nasal spray --CONTINUE regular aerobic activity. Goal 10,000 steps a day  Health Maintenance Immunization History  Administered Date(s) Administered   Moderna SARS-COV2 Booster Vaccination 08/30/2020   Moderna Sars-Covid-2 Vaccination 11/02/2019, 12/05/2019   Tdap 12/15/2015    No orders of the defined types were placed in this encounter.  Meds ordered this encounter  Medications   fluticasone-salmeterol (ADVAIR DISKUS) 500-50 MCG/ACT AEPB    Sig: Inhale 1 puff into the lungs in the morning and at bedtime.    Dispense:  60 each    Refill:  0   albuterol (PROVENTIL) (2.5 MG/3ML) 0.083% nebulizer solution    Sig: Take 3 mLs (2.5 mg total) by nebulization every 6 (six) hours as needed for wheezing or shortness of breath.    Dispense:  270 mL    Refill:  2   predniSONE (DELTASONE) 10 MG tablet    Sig: Take 4 tablets (40 mg total) by mouth daily with breakfast for 2 days, THEN 3 tablets (30 mg total) daily with breakfast for 2 days, THEN 2 tablets (20 mg total) daily with breakfast for 2 days, THEN 1 tablet (10 mg total) daily with breakfast for 2 days.    Dispense:  20 tablet    Refill:  0    Return in about 3 months (around 06/10/2023).   I have spent a total time of 45-minutes on the day of the appointment including chart review, data review, collecting history, coordinating care and discussing medical diagnosis and plan with the patient/family. Past  medical history, allergies, medications were reviewed. Pertinent imaging, labs and tests included in this note have been reviewed and interpreted independently by me.  Drago Hammonds Mechele Collin, MD [facility] 03/10/2023 10:26 AM  Office Number [phone]

## 2023-03-25 ENCOUNTER — Other Ambulatory Visit: Payer: Self-pay

## 2023-03-29 ENCOUNTER — Other Ambulatory Visit (HOSPITAL_COMMUNITY): Payer: Self-pay

## 2023-03-31 ENCOUNTER — Ambulatory Visit (HOSPITAL_BASED_OUTPATIENT_CLINIC_OR_DEPARTMENT_OTHER): Payer: 59 | Admitting: Pulmonary Disease

## 2023-04-19 ENCOUNTER — Other Ambulatory Visit: Payer: Self-pay

## 2023-04-21 ENCOUNTER — Other Ambulatory Visit: Payer: Self-pay | Admitting: Pulmonary Disease

## 2023-04-21 ENCOUNTER — Other Ambulatory Visit: Payer: Self-pay

## 2023-04-21 DIAGNOSIS — J4551 Severe persistent asthma with (acute) exacerbation: Secondary | ICD-10-CM

## 2023-04-22 ENCOUNTER — Other Ambulatory Visit: Payer: Self-pay

## 2023-04-22 MED ORDER — NUCALA 100 MG/ML ~~LOC~~ SOAJ
100.0000 mg | SUBCUTANEOUS | 1 refills | Status: DC
Start: 2023-04-22 — End: 2023-12-14
  Filled 2023-04-22: qty 1, 28d supply, fill #0
  Filled 2023-05-19: qty 1, 28d supply, fill #1

## 2023-04-22 NOTE — Telephone Encounter (Signed)
Refill sent for [place] to [facility]: [phone]   Dose: 100mg  SQ every 4 weeks  Last OV: 03/10/23 Provider: Dr. Everardo All   Next OV: due in October 2024 (not yet scheduled)   Routing to scheduling team for follow-up on appt scheduling  Chesley Mires, PharmD, MPH, BCPS Clinical Pharmacist (Rheumatology and Pulmonology)

## 2023-04-23 ENCOUNTER — Other Ambulatory Visit (HOSPITAL_COMMUNITY): Payer: Self-pay

## 2023-05-05 ENCOUNTER — Other Ambulatory Visit: Payer: Self-pay | Admitting: Pulmonary Disease

## 2023-05-19 ENCOUNTER — Other Ambulatory Visit: Payer: Self-pay

## 2023-05-19 NOTE — Progress Notes (Signed)
Specialty Pharmacy Refill Coordination Note  Amanda [name] is a 57 y.o. female contacted today regarding refills of specialty medication(s) Mepolizumab .  Patient requested Delivery  on 05/26/23  to verified address [address] [name], [place], [address]   Medication will be filled on 05/25/2023.

## 2023-05-25 ENCOUNTER — Other Ambulatory Visit: Payer: Self-pay

## 2023-06-15 ENCOUNTER — Other Ambulatory Visit: Payer: Self-pay

## 2023-06-18 ENCOUNTER — Other Ambulatory Visit: Payer: Self-pay

## 2023-06-21 ENCOUNTER — Other Ambulatory Visit: Payer: Self-pay

## 2023-08-27 ENCOUNTER — Ambulatory Visit (HOSPITAL_BASED_OUTPATIENT_CLINIC_OR_DEPARTMENT_OTHER): Payer: 59 | Admitting: Pulmonary Disease

## 2023-09-03 ENCOUNTER — Ambulatory Visit (HOSPITAL_BASED_OUTPATIENT_CLINIC_OR_DEPARTMENT_OTHER): Payer: 59 | Admitting: Pulmonary Disease

## 2023-09-07 ENCOUNTER — Encounter (HOSPITAL_BASED_OUTPATIENT_CLINIC_OR_DEPARTMENT_OTHER): Payer: Self-pay | Admitting: Pulmonary Disease

## 2023-09-24 ENCOUNTER — Other Ambulatory Visit: Payer: Self-pay

## 2023-09-24 ENCOUNTER — Telehealth: Payer: Self-pay | Admitting: Pharmacist

## 2023-09-24 NOTE — Telephone Encounter (Signed)
Received message from [place] at pharmacy - patient has not picked up called after 4 attempts for her Nucala refills.  Last filled 05/25/2023.  Patient cancelled appt 08/27/23 and no show on 09/03/23 (letter sent out on 09/07/23)  ATC patient - advised her to call pharmacy if still interesting in taking Nucala. Advise dher to call me back directly if no longer taking Nucala  Based on last OV note, patient had stopped Nucala for some time due to asthma being well-controlled and then was advised to restart back in July 2024  Chesley Mires, PharmD, MPH, BCPS, CPP Clinical Pharmacist (Rheumatology and Pulmonology)

## 2023-10-08 ENCOUNTER — Other Ambulatory Visit: Payer: Self-pay

## 2023-10-08 NOTE — Progress Notes (Signed)
Per Jacki Cones conversation with [facility], ok to disenroll at this time due to compliance.

## 2023-11-03 ENCOUNTER — Ambulatory Visit (HOSPITAL_BASED_OUTPATIENT_CLINIC_OR_DEPARTMENT_OTHER): Payer: 59 | Admitting: Pulmonary Disease

## 2023-11-03 ENCOUNTER — Encounter (HOSPITAL_BASED_OUTPATIENT_CLINIC_OR_DEPARTMENT_OTHER): Payer: Self-pay | Admitting: Pulmonary Disease

## 2023-11-11 ENCOUNTER — Ambulatory Visit: Admitting: Obstetrics and Gynecology

## 2023-11-11 ENCOUNTER — Encounter: Payer: Self-pay | Admitting: Obstetrics and Gynecology

## 2023-11-11 VITALS — BP 123/83 | HR 78 | Ht 67.0 in | Wt 224.0 lb

## 2023-11-11 DIAGNOSIS — Z1339 Encounter for screening examination for other mental health and behavioral disorders: Secondary | ICD-10-CM | POA: Diagnosis not present

## 2023-11-11 DIAGNOSIS — R350 Frequency of micturition: Secondary | ICD-10-CM | POA: Diagnosis not present

## 2023-11-11 DIAGNOSIS — R102 Pelvic and perineal pain: Secondary | ICD-10-CM

## 2023-11-11 LAB — POCT URINALYSIS DIPSTICK OB
Bilirubin, UA: NEGATIVE
Blood, UA: NEGATIVE
Glucose, UA: NEGATIVE
Ketones, UA: NEGATIVE
Nitrite, UA: NEGATIVE
Spec Grav, UA: 1.01 (ref 1.010–1.025)
Urobilinogen, UA: 0.2 U/dL
pH, UA: 5 (ref 5.0–8.0)

## 2023-11-11 NOTE — Progress Notes (Signed)
 58 y.o. New GYN presents for abdominal pain 9/10 x 6 months.  [facility] sent [place] the incorrect records, we need her [place] results.  Last Mammogram 11/04/2022 next due 11/04/2024 with Medicare.  Last PAP 08/06/2022  NILM

## 2023-11-11 NOTE — Patient Instructions (Addendum)
 FOODS TO AVOID IF YOU HAVE AN OVERACTIVE BLADDER Alcoholic beverages (liquor, beer, wine) Brewer's Yeast Carbonated beverages (soda, seltzer water) Sports Drinks Tea Milk/milk products Sugar & artificial sweeteners Coffee (even decaffeinated) Tea - black or green, regular or decaffeinated Honey Medicines with caffeine Chocolate Tomatoes and tomato-based products Citrus juice & fruits Corn Syrup Highly spiced foods/chilies Raw onion Saccharin Sour cream Soy sauce Strawberries Vinegar Vitamins buffered with aspartame  OVERACTIVE BLADDER DIET BETTER FOODS TO INCORPORATE Lean Proteins - fish, chicken breast, [place], low-fat beef, and pork are good options. Eggs are also a good source of protein if you're trying to avoid meat. Fiber-Rich Foods - these foods are filling and can help prevent constipation, which can put extra pressure on your bladder. Almonds, oats, pears, raspberries lentils, and beans are all good options when you want to add more fiber to your diet. Fruits - while some fruits, especially citrus, can irritate the bladder, it's still important to incorporate them into your diet. Bananas, apples, grapes, coconut, and watermelon are good options for those with overactive bladder. Vegetables - Leafy greens, like kale, lettuce, cucumber, squash, potatoes, broccoli, carrots, celery and bell peppers. Nuts Whole grains, like oats, barley, farro, and quinoa (also a great protein).   How do I train my bladder?  Once you have an idea of how often you are urinating, try to wait a little longer between trips to the toilet. This should be a gradual process:  ?First, slightly increase the amount of time between bathroom visits. For example, if you normally go every hour, add 15 minutes. This means trying to wait 1 hour and 15 minutes between trips to the bathroom.  ?Keep following this schedule for several days. If you can do this for 3 or 4 days without problems, increase the  time again. For example, you can add 15 more minutes between trips to the bathroom.  ?Keep doing this until you can wait at least 2 to 3 hours between bathroom visits. It can take time to get to this point. Some people notice improvement within a few weeks of bladder training. But it can take longer.  Keep filling out your bladder diary as you work on bladder training. This will help you see improvement over time. The process might take several weeks.   What else can I do to help with urgency?  Part of bladder training involves learning to control the urge to urinate and make your bladder wait. Some things you can do to help with this:  ?Change positions - It might help to cross your legs or sit on a firm surface.  ?Distract your mind - Try to think about something else until the urge passes.  ?Relax - Stay still when you get the urge to urinate. It might help to do deep breathing exercises. Do not rush to the toilet when it is time to go.  What else should I know?   These tips might help with bladder training:  ?Try to only urinate when you actually need to go - For example, avoid going to the bathroom before leaving home "just in case." This can train your brain to think that your bladder is full when it really isn't.  ?Drink fluids throughout the day - Make sure that you drink enough fluids to stay hydrated. This usually means about 8 cups (64 ounces) a day. Try to spread this throughout the day instead of drinking a lot all at once. If you usually drink a lot more  than this, ask your doctor or nurse if it is OK for you to reduce the amount.  ?Avoid drinking fluids soon before bedtime - This can lower the chances that you will need to urinate during the night.  ?Limit or avoid alcohol and caffeine - Some people find that these things make them need to urinate more.  When should I call the doctor?  Call your doctor or nurse if:  ?Your urinary symptoms are not getting better or are  getting worse.  ?You are having trouble with your training schedule - Bladder training can be frustrating and take time. But don't give up. Your doctor or nurse can help you.  ?You have other problems with urinating, such as pain or burning, not being able to urinate, or seeing blood in your urine.  More on this topic

## 2023-11-12 NOTE — Progress Notes (Signed)
   NEW GYNECOLOGY VISIT  Subjective:  Amanda [name] is a 58 y.o. menopausal G0 presenting for lower abdominal pain  Severe pelvic pain x 6 months similar to menstrual cramps but notes she has been in menopause for 10+ years. Pain is primarily on right side. Limits her activities, hard for her to get out of bed. Has chronic low back pain for which she takes percocet. Does not get relief from percocet, tylenol or ibuprofen. Denies vaginal bleeding. Not currently sexually active, but has had issues w/ dyspareunia in the past.   Also notes dysuria x 3 months. Notes burning with urination, urinary frequency, urgency, and nocturia. Wakes up hourly to void overnight. Drinks 3-4 water bottles daily and drinks 4 cups of coffee in the morning.   Longstanding history of IBS so has frequent bloating and weight fluctuations.   I personally reviewed the following: - Pelvic US 09/22/23 - uterus 5.4 x 3.6 x 2.4cm, EL 2.36mm, 6mm R fibroid, 4mm nabothian cyst, physiologic free fluid in pelvis, ovaries not visualized - Abdominal US 09/22/23 - normal liver, spleen, pancreas, kidneys, proximal ICV/aorta. S/p cholecystectomy  Objective:   Vitals:   11/11/23 1506  BP: 123/83  Pulse: 78  Weight: 224 lb (101.6 kg)  Height: 5\' 7"  (1.702 m)   General:  Alert, oriented and cooperative. Patient is in no acute distress.  Skin: Skin is warm and dry. No rash noted.   Cardiovascular: Normal heart rate noted  Respiratory: Normal respiratory effort, no problems with respiration noted  Abdomen: Soft, non-tender, non-distended   Pelvic: NEFG. Pain reproducible with palpation of R levators/obturators and abdominal wall. No uterus/adnexal/cervical motion tenderness.   Exam performed in the presence of a chaperone  Assessment and Plan:  Amanda [name] is a 58 y.o. with suspected myofascial pain and urinary symptoms c/f OAB  1. Urinary frequency (Primary) - POC Urinalysis Dipstick OB - Ambulatory referral to Physical  Therapy - Urine Culture  2. Pelvic pain - Ambulatory referral to Physical Therapy - Urine Culture  Return in about 3 months (around 02/11/2024) for return gyn to follow up pain & bladder symptoms.  Future Appointments  Date Time Provider Department Center  12/16/2023  9:15 AM Luciano Cutter, MD [facility]   Lennart Pall, MD

## 2023-11-13 LAB — URINE CULTURE

## 2023-11-23 ENCOUNTER — Encounter: Payer: 59 | Admitting: Advanced Practice Midwife

## 2023-12-08 ENCOUNTER — Ambulatory Visit (INDEPENDENT_AMBULATORY_CARE_PROVIDER_SITE_OTHER): Admitting: Obstetrics and Gynecology

## 2023-12-08 ENCOUNTER — Encounter: Payer: Self-pay | Admitting: Obstetrics and Gynecology

## 2023-12-08 VITALS — BP 125/84 | HR 76 | Ht 67.0 in | Wt 224.0 lb

## 2023-12-08 DIAGNOSIS — R350 Frequency of micturition: Secondary | ICD-10-CM

## 2023-12-08 DIAGNOSIS — R102 Pelvic and perineal pain: Secondary | ICD-10-CM | POA: Diagnosis not present

## 2023-12-08 MED ORDER — CYCLOBENZAPRINE HCL 10 MG PO TABS
10.0000 mg | ORAL_TABLET | Freq: Two times a day (BID) | ORAL | 2 refills | Status: DC | PRN
Start: 2023-12-08 — End: 2024-03-31

## 2023-12-08 NOTE — Progress Notes (Signed)
 Pt presents for f/u. Pt states urinary frequency has improved but pelvic pain has increased. Requesting second opinion.

## 2023-12-08 NOTE — Progress Notes (Signed)
 "   GYNECOLOGY VISIT  Patient name: LEIANI ENRIGHT MRN [id number]  Date of birth: 1966-01-16 Chief Complaint:   No chief complaint on file.   History:  VERNE COVE is a 58 y.o. G0P0000 being seen today for abdominopelvic pain.  Seen by KF (11/11/23): postmenopausal x10 years with new onset cramping and dysuria. Tried percocet (chronic back pain), tylenol  and ibuprofen  with minimal relief. Not sexually active but history of dyspareunia. Concomitant history of IBS    Discussed the use of AI scribe software for clinical note transcription with the patient, who gave verbal consent to proceed.  History of Present Illness A 59 year old female presents with chronic pelvic pain and urinary symptoms.  She has been experiencing pelvic pain for several months, described as 'attacks' that occur spontaneously. The pain is primarily located on the right side, near the right ovary, and is severe enough to impact her social life, including attending church. The pain worsens with certain movements, such as bending or sitting up. She has been postmenopausal for nine years and notes that the pain is worse than the slight cramps she experienced previously.  She has a history of increased urinary frequency, which has recently decreased. However, she describes a sensation of 'scalding hot' pain when urinating, without any blood in the urine. She also experiences a sensation of fullness in the lower abdomen when she needs to urinate. No blood in the urine but confirms the sensation of fullness and pain in the lower abdomen when needing to urinate.  She was previously diagnosed with overactive bladder and was referred to physical therapy, which she has not yet attended due to scheduling issues. She has a history of IBS, which is currently not active, and chronic back pain, which she believes may be contributing to her pelvic pain.  She has tried muscle relaxers in the past without issues and is currently on  medication for depression, which she manages well. She has not used vaginal estrogen specifically for urinary symptoms related to menopause.  She is not currently sexually active and has a small dog that she walks regularly, despite the pain.    Past Medical History:  Diagnosis Date   Anginal pain (HCC)    Anxiety    Arthritis    lower back   Asthma    Bipolar disorder (HCC)    Cardiac arrest (HCC) 2020   Carpal tunnel syndrome, bilateral    Chronic back pain    COPD (chronic obstructive pulmonary disease) (HCC)    Depression    Diabetes mellitus without complication (HCC)    Diverticulosis    Fx. left wrist    GERD (gastroesophageal reflux disease)    Heart murmur    born with   Heart murmur    HLD (hyperlipidemia)    Hypertension    IBS (irritable bowel syndrome)    Internal hemorrhoids    Muscle spasms of neck    Neuropathy    Pneumonia    PUD (peptic ulcer disease)     Past Surgical History:  Procedure Laterality Date   ARTHRODESIS METATARSALPHALANGEAL JOINT (MTPJ) Right 04/29/2016   Procedure: RIGHT GREAT TOE METATARSOPHALANGEAL JOINT (MTPJ) FUSION, HARDWARE REMOVAL;  Surgeon: Kay CHRISTELLA Cummins, MD;  Location: [facility];  Service: Orthopedics;  Laterality: Right;  RIGHT GREAT TOE METATARSOPHALANGEAL JOINT (MTPJ) FUSION, HARDWARE REMOVAL   CARPAL TUNNEL RELEASE Right 2003   CHOLECYSTECTOMY N/A 02/09/2014   Procedure: LAPAROSCOPIC CHOLECYSTECTOMY WITH INTRAOPERATIVE CHOLANGIOGRAM;  Surgeon: Donnice KATHEE Lunger, MD;  Location: [facility];  Service: General;  Laterality: N/A;   CHOLECYSTECTOMY     FOOT SURGERY     HAMMER TOE FUSION Bilateral 2008   HARDWARE REMOVAL Right 04/29/2016   Procedure: HARDWARE REMOVAL;  Surgeon: Kay CHRISTELLA Cummins, MD;  Location: [facility];  Service: Orthopedics;  Laterality: Right;  HARDWARE REMOVAL   KNEE ARTHROSCOPY Right 02/08/2023   Procedure: RIGHT KNEE ARTHROSCOPY;  Surgeon: Liam Lerner, MD;  Location: [facility];  Service:  Orthopedics;  Laterality: Right;   LEFT HEART CATH AND CORONARY ANGIOGRAPHY N/A 08/30/2018   Procedure: LEFT HEART CATH AND CORONARY ANGIOGRAPHY;  Surgeon: Anner Alm ORN, MD;  Location: [facility];  Service: Cardiovascular;  Laterality: N/A;   ROTATOR CUFF REPAIR Right 2011   WRIST FRACTURE SURGERY Left     The following portions of the patient's history were reviewed and updated as appropriate: allergies, current medications, past family history, past medical history, past social history, past surgical history and problem list.   Health Maintenance:   Last pap     Component Value Date/Time   DIAGPAP  08/06/2021 0930    - Negative for intraepithelial lesion or malignancy (NILM)   DIAGPAP  06/05/2016 0000    NEGATIVE FOR INTRAEPITHELIAL LESIONS OR MALIGNANCY.   HPVHIGH Negative 08/06/2021 0930   ADEQPAP  08/06/2021 0930    Satisfactory for evaluation; transformation zone component PRESENT.   ADEQPAP  06/05/2016 0000    Satisfactory for evaluation  endocervical/transformation zone component PRESENT.    High Risk HPV: Positive  Adequacy:  Satisfactory for evaluation, transformation zone component PRESENT  Diagnosis:  Atypical squamous cells of undetermined significance (ASC-US )  Last mammogram: 10/2022 [facility] 1   Review of Systems:  Pertinent items are noted in HPI. Comprehensive review of systems was otherwise negative.   Objective:  Physical Exam BP 125/84   Pulse 76   Ht 5' 7 (1.702 m)   Wt 224 lb (101.6 kg)   LMP 07/29/2018 (Approximate)   BMI 35.08 kg/m    Physical Exam Vitals and nursing note reviewed.  Constitutional:      Appearance: Normal appearance.  HENT:     Head: Normocephalic and atraumatic.  Pulmonary:     Effort: Pulmonary effort is normal.  Abdominal:     Comments: + carnett Multiple abdominal trigger points  Skin:    General: Skin is warm and dry.  Neurological:     General: No focal deficit present.     Mental Status: She is  alert.  Psychiatric:        Mood and Affect: Mood normal.        Behavior: Behavior normal.        Thought Content: Thought content normal.        Judgment: Judgment normal.      Abdominal Trigger Point Procedure Patient identified, informed consent performed, consent signed. The abdominal wall was examined and 11 points were identified and marked. Each site was cleaned with alcohol wipes. Each site was sprayed with hurricane spray and the needle introduced into the abdominal wall. Once the trigger point was confirmed to be entered by patient report of pain, 1cc of 1% lidocaine  was injected and then pressure held at the injection site. The patient tolerated the procedure well and was given post procedure instructions.       Assessment & Plan:  Assessment and Plan Assessment & Plan Chronic Pelvic Pain Chronic pelvic pain primarily on the right side, likely due to pelvic floor  muscle spasm from IBS and chronic back pain. Differential includes pelvic floor dysfunction and musculoskeletal pain. - Perform physical examination of abdominal wall muscles to identify trigger points. - Now status post trigger point injections with lidocaine . - Prescribe muscle relaxers and assess response  Urinary Symptoms Post-Menopause Urinary frequency and urgency likely related to post-menopausal changes. Discussed potential benefit of vaginal estrogen for symptom relief. - Recommend use of vaginal estrogen if urine negative for infection - Collect urine sample for analysis.  Chronic Back Pain Chronic back pain possibly contributing to pelvic floor muscle spasm and pelvic pain. Lumbar disc issues limit certain physical therapies.    Routine preventative health maintenance measures emphasized.  Carter Quarry, MD [facility], [facility] "

## 2023-12-10 LAB — URINE CULTURE

## 2023-12-14 ENCOUNTER — Other Ambulatory Visit: Payer: Self-pay

## 2023-12-14 ENCOUNTER — Ambulatory Visit (INDEPENDENT_AMBULATORY_CARE_PROVIDER_SITE_OTHER): Admitting: Allergy & Immunology

## 2023-12-14 ENCOUNTER — Encounter: Payer: Self-pay | Admitting: Allergy & Immunology

## 2023-12-14 VITALS — BP 100/68 | HR 70 | Temp 98.0°F | Resp 19 | Ht 64.25 in | Wt 225.2 lb

## 2023-12-14 DIAGNOSIS — J4540 Moderate persistent asthma, uncomplicated: Secondary | ICD-10-CM

## 2023-12-14 DIAGNOSIS — J310 Chronic rhinitis: Secondary | ICD-10-CM | POA: Diagnosis not present

## 2023-12-14 MED ORDER — LEVALBUTEROL HCL 1.25 MG/3ML IN NEBU
1.2500 mg | INHALATION_SOLUTION | Freq: Once | RESPIRATORY_TRACT | Status: AC
  Administered 2023-12-14: 1.25 mg via RESPIRATORY_TRACT

## 2023-12-14 MED ORDER — BUDESONIDE-FORMOTEROL FUMARATE 160-4.5 MCG/ACT IN AERO: 2.0000 | INHALATION_SPRAY | Freq: Two times a day (BID) | RESPIRATORY_TRACT | 5 refills | Status: DC

## 2023-12-14 NOTE — Patient Instructions (Addendum)
 1. Moderate persistent asthma, uncomplicated - Lung testing was a bit low, but it did improve with the albuterol  treatment.  - We are going to get you on Symbicort  to control your symptoms more effective. - We are going to get some labs to rule out difficult to control causes of asthma.  - Spacer sample and demonstration provided. - Daily controller medication(s): Symbicort  160/4.26mcg two puffs twice daily with spacer - Prior to physical activity: albuterol  2 puffs 10-15 minutes before physical activity. - Rescue medications: albuterol  4 puffs every 4-6 hours as needed - Asthma control goals:  * Full participation in all desired activities (may need albuterol  before activity) * Albuterol  use two time or less a week on average (not counting use with activity) * Cough interfering with sleep two time or less a month * Oral steroids no more than once a year * No hospitalizations  2. Chronic rhinitis - Because of insurance stipulations, we cannot do skin testing on the same day as your first visit. - We are all working to fight this, but for now we need to do two separate visits.  - We will know more after we do testing at the next visit.  - The skin testing visit can be squeezed in at your convenience.  - Then we can make a more full plan to address all of your symptoms. - Be sure to stop your antihistamines for 3 days before this appointment.   3. Return in about 2 weeks (around 12/28/2023), or SKIN TESTING (1-55). You can have the follow up appointment with Dr. Idolina Maker or a Nurse Practicioner (our Nurse Practitioners are excellent and always have Physician oversight!).    Please inform us  of any Emergency Department visits, hospitalizations, or changes in symptoms. Call us  before going to the ED for breathing or allergy symptoms since we might be able to fit you in for a sick visit. Feel free to contact us  anytime with any questions, problems, or concerns.  It was a pleasure to meet you  today!  Websites that have reliable patient information: 1. American Academy of Asthma, Allergy, and Immunology: www.aaaai.org 2. Food Allergy Research and Education (FARE): foodallergy.org 3. Mothers of Asthmatics: http://www.asthmacommunitynetwork.org 4. American College of Allergy, Asthma, and Immunology: www.acaai.org      "Like" us  on Facebook and Instagram for our latest updates!      A healthy democracy works best when [employer] participate! Make sure you are registered to vote! If you have moved or changed any of your contact information, you will need to get this updated before voting! Scan the QR codes below to learn more!

## 2023-12-14 NOTE — Progress Notes (Addendum)
 NEW PATIENT  Date of Service/Encounter:  12/14/23  Consult requested by: Pcp, No   Assessment:   Moderate persistent asthma, uncomplicated  Previously on Nucala   Chronic rhinitis  Disabled status  Plan/Recommendations:   1. Moderate persistent asthma, uncomplicated - Lung testing was a bit low, but it did improve with the albuterol  treatment.  - We are going to get you on Symbicort  to control your symptoms more effective. - We are going to get some labs to rule out difficult to control causes of asthma.  - Spacer sample and demonstration provided. - Daily controller medication(s): Symbicort  160/4.23mcg two puffs twice daily with spacer - Prior to physical activity: albuterol  2 puffs 10-15 minutes before physical activity. - Rescue medications: albuterol  4 puffs every 4-6 hours as needed - Asthma control goals:  * Full participation in all desired activities (may need albuterol  before activity) * Albuterol  use two time or less a week on average (not counting use with activity) * Cough interfering with sleep two time or less a month * Oral steroids no more than once a year * No hospitalizations  2. Chronic rhinitis - Because of insurance stipulations, we cannot do skin testing on the same day as your first visit. - We are all working to fight this, but for now we need to do two separate visits.  - We will know more after we do testing at the next visit.  - The skin testing visit can be squeezed in at your convenience.  - Then we can make a more full plan to address all of your symptoms. - Be sure to stop your antihistamines for 3 days before this appointment.   3. Return in about 2 weeks (around 12/28/2023), or SKIN TESTING (1-55). You can have the follow up appointment with Dr. Idolina Maker or a Nurse Practicioner (our Nurse Practitioners are excellent and always have Physician oversight!).    This note in its entirety was forwarded to the Provider who requested this  consultation.  Subjective:   BRIELE LAGASSE is a 58 y.o. female presenting today for evaluation of  Chief Complaint  Patient presents with   Establish Care    Allergies---itchy/watery eyes, nasal drainage. Takes singular, flonase , and xzyal-with no results.  Asthma--Wheezing at night, lots of throat clearing. Uses pillows to elevate head. Does get esophagus stretches every 6 months. Using 2 different inhalers.     Amanda [name] has a history of the following: Patient Active Problem List   Diagnosis Date Noted   Loose body in knee, right knee 02/03/2023   Severe persistent asthma with acute exacerbation 11/27/2021   Abnormal laboratory test 09/12/2021   Chronic pain syndrome 09/05/2021   Bipolar I disorder, most recent episode depressed, severe w psychosis (HCC) 02/25/2021   Alcohol abuse 02/25/2021   Abnormal weight loss 12/19/2020   Change in bowel habit 12/19/2020   Colon cancer screening 12/19/2020   Diarrhea 12/19/2020   Dysphagia 12/19/2020   Epigastric pain 12/19/2020   Family history of malignant neoplasm of gastrointestinal tract 12/19/2020   Non-ST elevation (NSTEMI) myocardial infarction [facility])    Contusion of chest 08/26/2018   Cardiac arrest (HCC) 08/26/2018   Cocaine abuse (HCC) 08/26/2018   HLD (hyperlipidemia) 08/26/2018   Elevated LFTs 08/26/2018   History of migraine    Degenerative joint disease 07/26/2017   Morbid obesity (HCC) 12/24/2016   Hyperlipidemia 12/23/2016   Painful orthopaedic hardware (HCC) 03/24/2016   Corns and callosities 03/24/2016   PUD (peptic ulcer disease)  01/02/2016   Onychomycosis 01/02/2016   Seasonal allergies 11/19/2015   GERD (gastroesophageal reflux disease) 08/06/2015   Asthma 08/06/2015   Migraine 08/06/2015   Bipolar disorder (HCC) 07/25/2015   Neuropathy 07/08/2015   Major depressive disorder, recurrent, severe without psychotic features (HCC)    Alcohol use disorder, severe, dependence (HCC) 11/16/2014   Cocaine  use disorder, severe, dependence (HCC) 11/16/2014   Acute calculous cholecystitis s/p lap chole 02/09/2014 02/09/2014   HTN (hypertension) 07/25/2013   Carpal tunnel syndrome 11/18/2012   Lumbar radiculopathy 11/18/2012   Anxiety state 11/18/2012    History obtained from: chart review and patient.  Discussed the use of AI scribe software for clinical note transcription with the patient and/or guardian, who gave verbal consent to proceed.  ISABELE LOLLAR was referred by Pcp, No.     Kobi is a 58 y.o. female presenting for an evaluation of asthma and allergies . She was previously managed by Dr. Washington Hacker, who is currently on maternity leave.  Asthma/Respiratory Symptom History: She is experiencing worsening respiratory symptoms and previously used a nebulizer machine twice daily but no longer has access to it. She uses albuterol  and montelukast , though montelukast  was only initially effective. A nasal spray provides minimal relief without causing epistaxis. She experiences significant wheezing and heavy breathing at night, requiring her to elevate her head with pillows. Her asthma symptoms were less severe when she lived in [place], [place] , and have worsened since moving 16 years ago.  Her last visit with Dr. Washington Hacker was July 2024.  At that time, she had stopped her Nucala  around 3 months ago because she was feeling better.  However, she was having increased wheezing and use of her rescue inhaler nebulizer.  She was encouraged to restart her Nucala .  She was started on Advair  discus 500 mcg 1 puff in the morning and at night.  She was also continued on montelukast  as well as Atrovent .  Allergic Rhinitis Symptom History: Three weeks ago, she experienced severe nasal symptoms with a runny nose and throat clearing, partially relieved by Benadryl . She has not undergone allergy testing or chest CT scans. She has not noticed any worsening of symptoms with her two-year-old Yorkie. She identifies  summer as the worst season for her symptoms, attributing it to the heat.   Skin Symptom History: No smoking history and no skin problems like hives or eczema.   Infection Symptom History: She reports frequent sinus infections, with a recent episode three weeks ago that was unresponsive to Benadryl .  It does not seem like she has been on antibiotics very frequently.  Aside from sinus infections, she does not only have a history of recurrent infections.  She does have a history of IBS.  Otherwise, there is no history of other atopic diseases, including drug allergies, stinging insect allergies, or contact dermatitis. There is no significant infectious history. Vaccinations are up to date.    Past Medical History: Patient Active Problem List   Diagnosis Date Noted   Loose body in knee, right knee 02/03/2023   Severe persistent asthma with acute exacerbation 11/27/2021   Abnormal laboratory test 09/12/2021   Chronic pain syndrome 09/05/2021   Bipolar I disorder, most recent episode depressed, severe w psychosis (HCC) 02/25/2021   Alcohol abuse 02/25/2021   Abnormal weight loss 12/19/2020   Change in bowel habit 12/19/2020   Colon cancer screening 12/19/2020   Diarrhea 12/19/2020   Dysphagia 12/19/2020   Epigastric pain 12/19/2020   Family history of malignant  neoplasm of gastrointestinal tract 12/19/2020   Non-ST elevation (NSTEMI) myocardial infarction [facility])    Contusion of chest 08/26/2018   Cardiac arrest (HCC) 08/26/2018   Cocaine abuse (HCC) 08/26/2018   HLD (hyperlipidemia) 08/26/2018   Elevated LFTs 08/26/2018   History of migraine    Degenerative joint disease 07/26/2017   Morbid obesity (HCC) 12/24/2016   Hyperlipidemia 12/23/2016   Painful orthopaedic hardware (HCC) 03/24/2016   Corns and callosities 03/24/2016   PUD (peptic ulcer disease) 01/02/2016   Onychomycosis 01/02/2016   Seasonal allergies 11/19/2015   GERD (gastroesophageal reflux disease) 08/06/2015   Asthma  08/06/2015   Migraine 08/06/2015   Bipolar disorder (HCC) 07/25/2015   Neuropathy 07/08/2015   Major depressive disorder, recurrent, severe without psychotic features (HCC)    Alcohol use disorder, severe, dependence (HCC) 11/16/2014   Cocaine use disorder, severe, dependence (HCC) 11/16/2014   Acute calculous cholecystitis s/p lap chole 02/09/2014 02/09/2014   HTN (hypertension) 07/25/2013   Carpal tunnel syndrome 11/18/2012   Lumbar radiculopathy 11/18/2012   Anxiety state 11/18/2012    Medication List:  Allergies as of 12/14/2023       Reactions   Penicillins Anaphylaxis   Has patient had a PCN reaction causing immediate rash, facial/tongue/throat swelling, SOB or lightheadedness with hypotension: Yes Has patient had a PCN reaction causing severe rash involving mucus membranes or skin necrosis: Yes Has patient had a PCN reaction that required hospitalization Yes Has patient had a PCN reaction occurring within the last 10 years: No-more than 10 years ago If all of the above answers are "NO", then may proceed with Cephalosporin use.   Aspirin  Rash        Medication List        Accurate as of December 14, 2023 11:45 AM. If you have any questions, ask your nurse or doctor.          STOP taking these medications    fluticasone -salmeterol 230-21 MCG/ACT inhaler Commonly known as: Advair  HFA Stopped by: Rochester Chuck   fluticasone -salmeterol 500-50 MCG/ACT Aepb Commonly known as: Advair  Diskus Stopped by: Genelda Roark Louis Tichina Koebel   linaclotide 145 MCG Caps capsule Commonly known as: IT consultant Stopped by: Rochester Chuck       TAKE these medications    albuterol  108 (90 Base) MCG/ACT inhaler Commonly known as: Ventolin  HFA Inhale 2 puffs into the lungs every 4 (four) hours as needed for wheezing or shortness of breath.   albuterol  (2.5 MG/3ML) 0.083% nebulizer solution Commonly known as: PROVENTIL  Take 3 mLs (2.5 mg total) by nebulization every 6 (six)  hours as needed for wheezing or shortness of breath.   ALPRAZolam  0.5 MG tablet Commonly known as: XANAX  Take 0.5 mg by mouth 2 (two) times daily.   amLODipine  5 MG tablet Commonly known as: NORVASC  Take 1 tablet (5 mg total) by mouth daily.   atorvastatin  10 MG tablet Commonly known as: LIPITOR Take 10 mg by mouth daily.   budesonide -formoterol  160-4.5 MCG/ACT inhaler Commonly known as: Symbicort  Inhale 2 puffs into the lungs in the morning and at bedtime. Started by: Rochester Chuck   Buprenorphine HCl 600 MCG Film Place 600 mcg inside cheek 2 (two) times daily.   buPROPion  300 MG 24 hr tablet Commonly known as: WELLBUTRIN  XL Take 300 mg by mouth daily.   cetirizine  10 MG tablet Commonly known as: ZYRTEC  Take 10 mg by mouth daily.   cyclobenzaprine  10 MG tablet Commonly known as: FLEXERIL  Take 1 tablet (10 mg total) by  mouth 2 (two) times daily as needed for muscle spasms.   desvenlafaxine 50 MG 24 hr tablet Commonly known as: PRISTIQ Take 50 mg by mouth at bedtime.   dicyclomine  20 MG tablet Commonly known as: BENTYL  Take 1 tablet (20 mg total) by mouth 2 (two) times daily.   famotidine  20 MG tablet Commonly known as: PEPCID  Take 20 mg by mouth 2 (two) times daily.   Farxiga 10 MG Tabs tablet Generic drug: dapagliflozin propanediol Take 10 mg by mouth daily.   fluticasone  50 MCG/ACT nasal spray Commonly known as: FLONASE  SMARTSIG:2 Spray(s) Both Nares Every Night   gabapentin  300 MG capsule Commonly known as: NEURONTIN  Take 300 mg by mouth 3 (three) times daily.   hydrOXYzine  50 MG tablet Commonly known as: ATARAX  Take 50 mg by mouth in the morning, at noon, and at bedtime.   ipratropium 0.03 % nasal spray Commonly known as: ATROVENT  Place 2 sprays into the nose 2 (two) times daily.   isosorbide  mononitrate 120 MG 24 hr tablet Commonly known as: IMDUR  Take 120 mg by mouth daily.   levocetirizine 5 MG tablet Commonly known as: XYZAL Take  5 mg by mouth daily as needed.   MELATONIN PO Take 1 tablet by mouth at bedtime as needed (sleep).   metoprolol  tartrate 50 MG tablet Commonly known as: LOPRESSOR  Take 50 mg by mouth 2 (two) times daily.   montelukast  10 MG tablet Commonly known as: SINGULAIR  TAKE 1 TABLET(10 MG) BY MOUTH AT BEDTIME   nitroGLYCERIN  0.4 MG SL tablet Commonly known as: NITROSTAT  Place 0.4 mg under the tongue every 5 (five) minutes as needed for chest pain.   Nucala  100 MG/ML Soaj Generic drug: Mepolizumab  Inject 1 mL (100 mg total) into the skin every 28 (twenty-eight) days.   omeprazole  20 MG capsule Commonly known as: PRILOSEC  Take 20 mg by mouth in the morning and at bedtime.   ondansetron  4 MG tablet Commonly known as: ZOFRAN  Take 1 tablet (4 mg total) by mouth every 8 (eight) hours as needed for nausea or vomiting.   oxyCODONE -acetaminophen  10-325 MG tablet Commonly known as: PERCOCET Take 1 tablet by mouth every 4 (four) hours as needed for pain.   paliperidone 6 MG 24 hr tablet Commonly known as: INVEGA Take 6 mg by mouth at bedtime.   sucralfate  1 g tablet Commonly known as: CARAFATE  Take 1 g by mouth 3 (three) times daily as needed.   trazodone  300 MG tablet Commonly known as: DESYREL  Take 300 mg by mouth at bedtime.   Trulance 3 MG Tabs Generic drug: Plecanatide Take 1 tablet by mouth daily.        Birth History: non-contributory  Developmental History: non-contributory  Past Surgical History: Past Surgical History:  Procedure Laterality Date   ARTHRODESIS METATARSALPHALANGEAL JOINT (MTPJ) Right 04/29/2016   Procedure: RIGHT GREAT TOE METATARSOPHALANGEAL JOINT (MTPJ) FUSION, HARDWARE REMOVAL;  Surgeon: Wes Hamman, MD;  Location: [facility];  Service: Orthopedics;  Laterality: Right;  RIGHT GREAT TOE METATARSOPHALANGEAL JOINT (MTPJ) FUSION, HARDWARE REMOVAL   CARPAL TUNNEL RELEASE Right 2003   CHOLECYSTECTOMY N/A 02/09/2014   Procedure:  LAPAROSCOPIC CHOLECYSTECTOMY WITH INTRAOPERATIVE CHOLANGIOGRAM;  Surgeon: Azucena Bollard, MD;  Location: [facility];  Service: General;  Laterality: N/A;   CHOLECYSTECTOMY     FOOT SURGERY     HAMMER TOE FUSION Bilateral 2008   HARDWARE REMOVAL Right 04/29/2016   Procedure: HARDWARE REMOVAL;  Surgeon: Wes Hamman, MD;  Location: [facility];  Service: Orthopedics;  Laterality: Right;  HARDWARE REMOVAL   KNEE ARTHROSCOPY Right 02/08/2023   Procedure: RIGHT KNEE ARTHROSCOPY;  Surgeon: Wendolyn Hamburger, MD;  Location: [facility];  Service: Orthopedics;  Laterality: Right;   LEFT HEART CATH AND CORONARY ANGIOGRAPHY N/A 08/30/2018   Procedure: LEFT HEART CATH AND CORONARY ANGIOGRAPHY;  Surgeon: Arleen Lacer, MD;  [facility]: [facility];  Service: Cardiovascular;  Laterality: N/A;   ROTATOR CUFF REPAIR Right 2011   TONSILLECTOMY     WRIST FRACTURE SURGERY Left      Family History: Family History  Problem Relation Age of Onset   Cancer Mother    Pancreatic cancer Mother    CAD Father    Diabetes Mellitus II Father    Heart failure Father    Diabetes Father    Asthma Sister    Allergic rhinitis Sister    Other Sister        Brain tumor   Allergic rhinitis Brother    HIV/AIDS Brother    Breast cancer Paternal Grandmother    Colon cancer Neg Hx      Social History: Amanda [name] lives at home with her family.  She lives in an apartment that is 67 months old.  There is wood flooring throughout the home.  She has carpeting in her bedroom.  There is electric heating and central cooling.  There is one puppy inside of the home from around 2 years ago.  The addition of the puppy did not seem to make anything worse.  There are dust mite covers on the bedding.  There is no tobacco exposure.  She is currently unemployed and on disability.  There is no fume, chemical, or dust exposure.  She does not have a HEPA filter in the home.  [place] not [place] interstate or industrial  area.   Review of systems otherwise negative other than that mentioned in the HPI.    Objective:   Blood pressure 100/68, pulse 70, temperature 98 F (36.7 C), resp. rate 19, height 5' 4.25" (1.632 m), weight 225 lb 3.2 oz (102.2 kg), last menstrual period 07/29/2018, SpO2 97%. Body mass index is 38.36 kg/m.     Physical Exam Vitals reviewed.  Constitutional:      Appearance: She is well-developed.     Comments: Pleasant.  HENT:     Head: Normocephalic and atraumatic.     Right Ear: Tympanic membrane, ear canal and external ear normal. No drainage, swelling or tenderness. Tympanic membrane is not injected, scarred, erythematous, retracted or bulging.     Left Ear: Tympanic membrane, ear canal and external ear normal. No drainage, swelling or tenderness. Tympanic membrane is not injected, scarred, erythematous, retracted or bulging.     Nose: No nasal deformity, septal deviation, mucosal edema or rhinorrhea.     Right Turbinates: Enlarged and swollen.     Left Turbinates: Enlarged and swollen.     Right Sinus: No maxillary sinus tenderness or frontal sinus tenderness.     Left Sinus: No maxillary sinus tenderness or frontal sinus tenderness.     Mouth/Throat:     Mouth: Mucous membranes are not pale and not dry.     Pharynx: Uvula midline.  Eyes:     General:        Right eye: No discharge.        Left eye: No discharge.     Conjunctiva/sclera: Conjunctivae normal.     Right eye: Right conjunctiva is not injected. No chemosis.  Left eye: Left conjunctiva is not injected. No chemosis.    Pupils: Pupils are equal, round, and reactive to light.  Cardiovascular:     Rate and Rhythm: Normal rate and regular rhythm.     Heart sounds: Normal heart sounds.  Pulmonary:     Effort: Pulmonary effort is normal. No tachypnea, accessory muscle usage or respiratory distress.     Breath sounds: Normal breath sounds. No wheezing, rhonchi or rales.     Comments: Moving air well in all  lung fields.  No increased work of breathing. Chest:     Chest wall: No tenderness.  Abdominal:     Tenderness: There is no abdominal tenderness. There is no guarding or rebound.  Lymphadenopathy:     Head:     Right side of head: No submandibular, tonsillar or occipital adenopathy.     Left side of head: No submandibular, tonsillar or occipital adenopathy.     Cervical: No cervical adenopathy.  Skin:    Coloration: Skin is not pale.     Findings: No abrasion, erythema, petechiae or rash. Rash is not papular, urticarial or vesicular.  Neurological:     Mental Status: She is alert.  Psychiatric:        Behavior: Behavior is cooperative.       Diagnostic studies:   Spirometry: results abnormal (FEV1: 1.32/60 %, FVC: 2.07/75 %, FEV1/FVC: 68%).    Spirometry consistent with possible restrictive disease. Xopenex  nebulizer treatment given in clinic with improvement in FEV1, but not significant per ATS criteria.  Allergy Studies: deferred due to insurance stipulations that require a separate visit for testing          Drexel Gentles, MD [facility] 

## 2023-12-14 NOTE — Addendum Note (Signed)
 Addended by: Rochester Chuck on: 12/14/2023 11:58 AM   Modules accepted: Orders

## 2023-12-15 MED ORDER — ESTRADIOL 0.1 MG/GM VA CREA
TOPICAL_CREAM | VAGINAL | 12 refills | Status: DC
Start: 2023-12-15 — End: 2024-03-31

## 2023-12-16 ENCOUNTER — Ambulatory Visit (HOSPITAL_BASED_OUTPATIENT_CLINIC_OR_DEPARTMENT_OTHER): Admitting: Pulmonary Disease

## 2023-12-16 ENCOUNTER — Encounter (HOSPITAL_BASED_OUTPATIENT_CLINIC_OR_DEPARTMENT_OTHER): Payer: Self-pay | Admitting: Pulmonary Disease

## 2023-12-19 LAB — CBC WITH DIFFERENTIAL/PLATELET
Basophils Absolute: 0.1 10*3/uL (ref 0.0–0.2)
Basos: 1 %
EOS (ABSOLUTE): 0.3 10*3/uL (ref 0.0–0.4)
Eos: 4 %
Hematocrit: 33.7 % — ABNORMAL LOW (ref 34.0–46.6)
Hemoglobin: 11.2 g/dL (ref 11.1–15.9)
Immature Grans (Abs): 0 10*3/uL (ref 0.0–0.1)
Immature Granulocytes: 0 %
Lymphocytes Absolute: 4.2 10*3/uL — ABNORMAL HIGH (ref 0.7–3.1)
Lymphs: 54 %
MCH: 32 pg (ref 26.6–33.0)
MCHC: 33.2 g/dL (ref 31.5–35.7)
MCV: 96 fL (ref 79–97)
Monocytes Absolute: 0.7 10*3/uL (ref 0.1–0.9)
Monocytes: 9 %
Neutrophils Absolute: 2.5 10*3/uL (ref 1.4–7.0)
Neutrophils: 32 %
Platelets: 260 10*3/uL (ref 150–450)
RBC: 3.5 x10E6/uL — ABNORMAL LOW (ref 3.77–5.28)
RDW: 12.7 % (ref 11.7–15.4)
WBC: 7.8 10*3/uL (ref 3.4–10.8)

## 2023-12-19 LAB — ASPERGILLUS PRECIPITINS
A.Fumigatus #1 Abs: NEGATIVE
Aspergillus Flavus Antibodies: NEGATIVE
Aspergillus Niger Antibodies: NEGATIVE
Aspergillus glaucus IgG: NEGATIVE
Aspergillus nidulans IgG: NEGATIVE
Aspergillus terreus IgG: NEGATIVE

## 2023-12-19 LAB — ALPHA-1-ANTITRYPSIN: A-1 Antitrypsin: 136 mg/dL (ref 101–187)

## 2023-12-19 LAB — ANCA TITERS
Atypical pANCA: <1:20 {titer}
C-ANCA: <1:20 {titer}
P-ANCA: <1:20 {titer}

## 2023-12-19 LAB — IGE: IgE (Immunoglobulin E), Serum: 4 [IU]/mL — ABNORMAL LOW (ref 6–495)

## 2023-12-20 ENCOUNTER — Telehealth: Payer: Self-pay | Admitting: Allergy & Immunology

## 2023-12-20 NOTE — Telephone Encounter (Addendum)
 Spoke with the patient--DOB verified--informed her of lab results. Would like to discuss Fasenra with Dr. Idolina Maker at next visit. Verbalized understanding.  ----- Message from [facility] Georga Killings S sent at 12/20/2023  9:21 AM EDT ----- LVM requesting callback about labs. ----- Message ----- From: Rochester Chuck, MD Sent: 12/17/2023   4:40 PM EDT To: Dianna Fortis Clinical  Can someone call the patient to let her know that her labs showed that she did have a high enough eosinophil count to qualify for an injectable medication for her asthma if she was interested? I would recommend Melisa Spray if we go that direction.  Drexel Gentles, MD [facility] 

## 2023-12-20 NOTE — Telephone Encounter (Signed)
 Pt request call back about labs, ok to leave a detailed message on cell phone vm.

## 2023-12-30 ENCOUNTER — Ambulatory Visit: Admitting: Allergy & Immunology

## 2024-01-05 ENCOUNTER — Ambulatory Visit: Admitting: Podiatry

## 2024-01-06 ENCOUNTER — Encounter: Payer: Self-pay | Admitting: Allergy & Immunology

## 2024-01-06 ENCOUNTER — Other Ambulatory Visit: Payer: Self-pay | Admitting: Allergy & Immunology

## 2024-01-06 ENCOUNTER — Other Ambulatory Visit: Payer: Self-pay

## 2024-01-06 ENCOUNTER — Ambulatory Visit (INDEPENDENT_AMBULATORY_CARE_PROVIDER_SITE_OTHER): Admitting: Allergy & Immunology

## 2024-01-06 VITALS — BP 130/72 | HR 75 | Temp 97.6°F | Resp 18 | Ht 66.14 in | Wt 221.3 lb

## 2024-01-06 DIAGNOSIS — J310 Chronic rhinitis: Secondary | ICD-10-CM

## 2024-01-06 DIAGNOSIS — J4540 Moderate persistent asthma, uncomplicated: Secondary | ICD-10-CM

## 2024-01-06 DIAGNOSIS — K219 Gastro-esophageal reflux disease without esophagitis: Secondary | ICD-10-CM

## 2024-01-06 MED ORDER — BUDESONIDE-FORMOTEROL FUMARATE 160-4.5 MCG/ACT IN AERO: 2.0000 | INHALATION_SPRAY | Freq: Two times a day (BID) | RESPIRATORY_TRACT | 1 refills | Status: DC

## 2024-01-06 MED ORDER — FAMOTIDINE 20 MG PO TABS: 20.0000 mg | ORAL_TABLET | Freq: Two times a day (BID) | ORAL | 1 refills | Status: DC

## 2024-01-06 MED ORDER — ALBUTEROL SULFATE (2.5 MG/3ML) 0.083% IN NEBU: 2.5000 mg | INHALATION_SOLUTION | Freq: Four times a day (QID) | RESPIRATORY_TRACT | 1 refills | Status: AC | PRN

## 2024-01-06 MED ORDER — SPACER/AERO-HOLDING CHAMBERS DEVI: 1.0000 | 1 refills | Status: AC

## 2024-01-06 MED ORDER — ALBUTEROL SULFATE HFA 108 (90 BASE) MCG/ACT IN AERS: 2.0000 | INHALATION_SPRAY | RESPIRATORY_TRACT | 1 refills | Status: DC | PRN

## 2024-01-06 MED ORDER — PANTOPRAZOLE SODIUM 40 MG PO TBEC: 40.0000 mg | DELAYED_RELEASE_TABLET | Freq: Two times a day (BID) | ORAL | 1 refills | Status: DC

## 2024-01-06 MED ORDER — LEVOCETIRIZINE DIHYDROCHLORIDE 5 MG PO TABS: 5.0000 mg | ORAL_TABLET | Freq: Every day | ORAL | 1 refills | Status: DC | PRN

## 2024-01-06 MED ORDER — NEBULIZER MASK ADULT MISC: 1.0000 | 1 refills | Status: AC

## 2024-01-06 MED ORDER — MONTELUKAST SODIUM 10 MG PO TABS: 10.0000 mg | ORAL_TABLET | Freq: Every evening | ORAL | 1 refills | Status: DC

## 2024-01-06 MED ORDER — FLUTICASONE PROPIONATE 50 MCG/ACT NA SUSP: 1.0000 | Freq: Every morning | NASAL | 1 refills | Status: DC

## 2024-01-06 NOTE — Addendum Note (Signed)
 Addended by: Mollie Anger on: 01/06/2024 04:33 PM   Modules accepted: Orders

## 2024-01-06 NOTE — Progress Notes (Signed)
 FOLLOW UP  Date of Service/Encounter:  01/06/24   Assessment:   Moderate persistent asthma, uncomplicated   Previously on Nucala , but due to hesitance with injections, we are going to change to Fasenra (AEC 300)    Chronic rhinitis - getting blood work for environmental allergies today   Disabled status    Plan/Recommendations:   1. Moderate persistent asthma, uncomplicated - Lung testing showed lower values than those obtained even last month. - I think that we need to add an injectable medication to help with your lung function. - Spacer teaching provided today. - This helps the medication get delivered into the lungs more effectively.  - Daily controller medication(s): Symbicort  160/4.60mcg two puffs twice daily with spacer - Prior to physical activity: albuterol  2 puffs 10-15 minutes before physical activity. - Rescue medications: albuterol  4 puffs every 4-6 hours as needed - Asthma control goals:  * Full participation in all desired activities (may need albuterol  before activity) * Albuterol  use two time or less a week on average (not counting use with activity) * Cough interfering with sleep two time or less a month * Oral steroids no more than once a year * No hospitalizations  2. Chronic rhinitis - We are going to get blood work to look into this further. - Continue with the Xyzal (levocetirizine) 5mg  fdaily. - Continue with the Flonase  fluticasone ) one spray per nostril daily (aim for the ears). - Continue with the Singulair  (montelukast ) 10mg  once daily.   3. GERD  - The nighttime wheezing makes me think either uncontrolled reflux or a heart issue. - I am glad that you are already hooked into a cardiologist. - Stop the omeprazole  and start pantoprazole  40mg  TWICE DAILY.  - Continue with the Pepcid  (famotidine ) 20mg  TWICE DAILY.   4. Return in about 2 months (around 03/07/2024). You can have the follow up appointment with Dr. Idolina Maker or a Nurse Practicioner  (our Nurse Practitioners are excellent and always have Physician oversight!).   Subjective:   Amanda [name] is a 58 y.o. female presenting today for follow up of  Chief Complaint  Patient presents with   Asthma   Wheezing    Amanda [name] has a history of the following: Patient Active Problem List   Diagnosis Date Noted   Loose body in knee, right knee 02/03/2023   Severe persistent asthma with acute exacerbation 11/27/2021   Abnormal laboratory test 09/12/2021   Chronic pain syndrome 09/05/2021   Bipolar I disorder, most recent episode depressed, severe w psychosis (HCC) 02/25/2021   Alcohol abuse 02/25/2021   Abnormal weight loss 12/19/2020   Change in bowel habit 12/19/2020   Colon cancer screening 12/19/2020   Diarrhea 12/19/2020   Dysphagia 12/19/2020   Epigastric pain 12/19/2020   Family history of malignant neoplasm of gastrointestinal tract 12/19/2020   Non-ST elevation (NSTEMI) myocardial infarction [facility])    Contusion of chest 08/26/2018   Cardiac arrest (HCC) 08/26/2018   Cocaine abuse (HCC) 08/26/2018   HLD (hyperlipidemia) 08/26/2018   Elevated LFTs 08/26/2018   History of migraine    Degenerative joint disease 07/26/2017   Morbid obesity (HCC) 12/24/2016   Hyperlipidemia 12/23/2016   Painful orthopaedic hardware (HCC) 03/24/2016   Corns and callosities 03/24/2016   PUD (peptic ulcer disease) 01/02/2016   Onychomycosis 01/02/2016   Seasonal allergies 11/19/2015   GERD (gastroesophageal reflux disease) 08/06/2015   Asthma 08/06/2015   Migraine 08/06/2015   Bipolar disorder (HCC) 07/25/2015   Neuropathy 07/08/2015  Major depressive disorder, recurrent, severe without psychotic features (HCC)    Alcohol use disorder, severe, dependence (HCC) 11/16/2014   Cocaine use disorder, severe, dependence (HCC) 11/16/2014   Acute calculous cholecystitis s/p lap chole 02/09/2014 02/09/2014   HTN (hypertension) 07/25/2013   Carpal tunnel syndrome 11/18/2012    Lumbar radiculopathy 11/18/2012   Anxiety state 11/18/2012    History obtained from: chart review and patient.  Cardiologist: Milton Alpers. Glena Landau, MD Gastroenterologist: Dr. Tami Falcon   Discussed the use of AI scribe software for clinical note transcription with the patient and/or guardian, who gave verbal consent to proceed.  Amanda [name] is a 58 y.o. female presenting for a follow up visit.  She was last seen in April 2025.  At that time, her lung testing looked a bit low but did improve with albuterol .  Be put on Symbicort  160 mcg 2 puffs twice daily and continue with albuterol  as needed.    Since the last visit, she has done a little bit worse.   Asthma/Respiratory Symptom History: She has a history of seasonal asthma with worsening symptoms over the past few months. Despite using levocetirizine initially once a day and then twice a day, her symptoms have not improved. Her sinus issues have been severe, and she has been using Symbicort  twice daily without a spacer and Ventolin  as needed. She recalls being on Nucala  in the past but discontinued it due to a dislike of injections. She experiences shortness of breath during the day, especially when walking her dog, and sometimes feels pain when breathing deeply.  She has been seeing a cardiologist for five years and is scheduled for a follow-up in June.   Allergic Rhinitis Symptom History: Currently, she experiences nighttime wheezing and uses Flonase  daily. She also takes montelukast  but is unsure of its effectiveness. She mentions having a sore in her nose that resolved but was bothersome due to constant nasal drainage. She did take her allergy medication today, therefore we cannot do skin testing.   GERD Symptom History: She has a history of GERD and IBS, for which she takes omeprazole , amlodipine , and sucralfate  daily. She has had polyps removed regularly and experiences hoarseness, which she attributes to either her throat or asthma. She also sees  a GI doctor regularly for her gastrointestinal issues. She is trying to manage her weight and is mindful of her diet to help with her IBS symptoms.   Otherwise, there have been no changes to her past medical history, surgical history, family history, or social history.    Review of systems otherwise negative other than that mentioned in the HPI.    Objective:   Blood pressure 130/72, pulse 75, temperature 97.6 F (36.4 C), temperature source Temporal, resp. rate 18, height 5' 6.14" (1.68 m), weight 221 lb 4.8 oz (100.4 kg), last menstrual period 07/29/2018, SpO2 98%. Body mass index is 35.57 kg/m.    Physical Exam Vitals reviewed.  Constitutional:      Appearance: She is well-developed.     Comments: Pleasant.  HENT:     Head: Normocephalic and atraumatic.     Right Ear: Tympanic membrane, ear canal and external ear normal. No drainage, swelling or tenderness. Tympanic membrane is not injected, scarred, erythematous, retracted or bulging.     Left Ear: Tympanic membrane, ear canal and external ear normal. No drainage, swelling or tenderness. Tympanic membrane is not injected, scarred, erythematous, retracted or bulging.     Nose: No nasal deformity, septal deviation, mucosal edema or rhinorrhea.  Right Turbinates: Enlarged, swollen and pale.     Left Turbinates: Enlarged, swollen and pale.     Right Sinus: No maxillary sinus tenderness or frontal sinus tenderness.     Left Sinus: No maxillary sinus tenderness or frontal sinus tenderness.     Mouth/Throat:     Mouth: Mucous membranes are not pale and not dry.     Pharynx: Uvula midline.  Eyes:     General:        Right eye: No discharge.        Left eye: No discharge.     Conjunctiva/sclera: Conjunctivae normal.     Right eye: Right conjunctiva is not injected. No chemosis.    Left eye: Left conjunctiva is not injected. No chemosis.    Pupils: Pupils are equal, round, and reactive to light.  Cardiovascular:     Rate and  Rhythm: Normal rate and regular rhythm.     Heart sounds: Normal heart sounds.  Pulmonary:     Effort: Pulmonary effort is normal. No tachypnea, accessory muscle usage or respiratory distress.     Breath sounds: Normal breath sounds. No wheezing, rhonchi or rales.     Comments: Moving air well in all lung fields.  Decreased air movement at the bases.  No overt wheezing. Chest:     Chest wall: No tenderness.  Lymphadenopathy:     Head:     Right side of head: No submandibular, tonsillar or occipital adenopathy.     Left side of head: No submandibular, tonsillar or occipital adenopathy.     Cervical: No cervical adenopathy.  Skin:    Coloration: Skin is not pale.     Findings: No abrasion, erythema, petechiae or rash. Rash is not papular, urticarial or vesicular.  Neurological:     Mental Status: She is alert.  Psychiatric:        Behavior: Behavior is cooperative.      Diagnostic studies:    Spirometry: results abnormal (FEV1: 1.24/57%, FVC: 1.69/61%, FEV1/FVC: 73%).    Spirometry consistent with possible restrictive disease.   Allergy Studies: labs sent instead       Drexel Gentles, MD  [facility] 

## 2024-01-06 NOTE — Patient Instructions (Addendum)
 1. Moderate persistent asthma, uncomplicated - Lung testing showed lower values than those obtained even last month. - I think that we need to add an injectable medication to help with your lung function. - Spacer teaching provided today. - This helps the medication get delivered into the lungs more effectively.  - Daily controller medication(s): Symbicort  160/4.45mcg two puffs twice daily with spacer - Prior to physical activity: albuterol  2 puffs 10-15 minutes before physical activity. - Rescue medications: albuterol  4 puffs every 4-6 hours as needed - Asthma control goals:  * Full participation in all desired activities (may need albuterol  before activity) * Albuterol  use two time or less a week on average (not counting use with activity) * Cough interfering with sleep two time or less a month * Oral steroids no more than once a year * No hospitalizations  2. Chronic rhinitis - We are going to get blood work to look into this further. - Continue with the Xyzal (levocetirizine) 5mg  fdaily. - Continue with the Flonase  fluticasone ) one spray per nostril daily (aim for the ears). - Continue with the Singulair  (montelukast ) 10mg  once daily.   3. GERD  - The nighttime wheezing makes me think either uncontrolled reflux or a heart issue. - I am glad that you are already hooked into a cardiologist. - Stop the omeprazole  and start pantoprazole  40mg  TWICE DAILY.  - Continue with the Pepcid  (famotidine ) 20mg  TWICE DAILY.   4. Return in about 2 months (around 03/07/2024). You can have the follow up appointment with Dr. Idolina Maker or a Nurse Practicioner (our Nurse Practitioners are excellent and always have Physician oversight!).    Please inform us  of any Emergency Department visits, hospitalizations, or changes in symptoms. Call us  before going to the ED for breathing or allergy symptoms since we might be able to fit you in for a sick visit. Feel free to contact us  anytime with any questions,  problems, or concerns.  It was a pleasure to meet you today!  Websites that have reliable patient information: 1. American Academy of Asthma, Allergy, and Immunology: www.aaaai.org 2. Food Allergy Research and Education (FARE): foodallergy.org 3. Mothers of Asthmatics: http://www.asthmacommunitynetwork.org 4. American College of Allergy, Asthma, and Immunology: www.acaai.org      "Like" us  on Facebook and Instagram for our latest updates!      A healthy democracy works best when [employer] participate! Make sure you are registered to vote! If you have moved or changed any of your contact information, you will need to get this updated before voting! Scan the QR codes below to learn more!

## 2024-01-08 LAB — ALLERGENS W/COMP RFLX AREA 2
Alternaria Alternata IgE: <0.1 kU/L
Aspergillus Fumigatus IgE: <0.1 kU/L
Bermuda Grass IgE: <0.1 kU/L
Cedar, Mountain IgE: <0.1 kU/L
Cladosporium Herbarum IgE: <0.1 kU/L
Cockroach, German IgE: <0.1 kU/L
Common Silver Birch IgE: <0.1 kU/L
Cottonwood IgE: <0.1 kU/L
D Farinae IgE: <0.1 kU/L
D Pteronyssinus IgE: <0.1 kU/L
E001-IgE Cat Dander: <0.1 kU/L
E005-IgE Dog Dander: <0.1 kU/L
Elm, American IgE: <0.1 kU/L
IgE (Immunoglobulin E), Serum: 6 [IU]/mL (ref 6–495)
Johnson Grass IgE: <0.1 kU/L
Maple/Box Elder IgE: <0.1 kU/L
Mouse Urine IgE: <0.1 kU/L
Oak, White IgE: <0.1 kU/L
Pecan, Hickory IgE: <0.1 kU/L
Penicillium Chrysogen IgE: <0.1 kU/L
Pigweed, Rough IgE: <0.1 kU/L
Ragweed, Short IgE: <0.1 kU/L
Sheep Sorrel IgE Qn: <0.1 kU/L
Timothy Grass IgE: <0.1 kU/L
White Mulberry IgE: <0.1 kU/L

## 2024-01-11 ENCOUNTER — Ambulatory Visit: Payer: Self-pay | Admitting: Allergy & Immunology

## 2024-01-19 ENCOUNTER — Telehealth: Payer: Self-pay | Admitting: Licensed Clinical Social Worker

## 2024-01-19 NOTE — Telephone Encounter (Signed)
 [facility] contacted patient on this date to provide [facility] intro and to schedule [facility] appointment. VM was left.

## 2024-01-31 MED ORDER — CARBINOXAMINE MALEATE 4 MG PO TABS: 4.0000 mg | ORAL_TABLET | Freq: Two times a day (BID) | ORAL | 0 refills | Status: AC

## 2024-02-09 ENCOUNTER — Ambulatory Visit: Admitting: Obstetrics and Gynecology

## 2024-03-05 NOTE — Progress Notes (Unsigned)
   [address] [place] [address] Dept: [phone]  FOLLOW UP NOTE  Patient ID: Amanda [name], female    DOB: 10/04/1965  Age: 58 y.o. MRN: [id number] Date of Office Visit: 03/06/2024  Assessment  Chief Complaint: No chief complaint on file.  HPI Amanda [name] is a 58 year old female who presents to the clinic for follow-up visit.  She was last seen in this clinic on 12/27/2023 by Dr. Iva for evaluation of asthma, chronic rhinitis, and reflux.  Her last environmental allergy testing via lab on 01/09/2024 was negative to the environmental panel.  Discussed the use of AI scribe software for clinical note transcription with the patient, who gave verbal consent to proceed.  History of Present Illness      Drug Allergies:  Allergies  Allergen Reactions   Penicillins Anaphylaxis    Has patient had a PCN reaction causing immediate rash, facial/tongue/throat swelling, SOB or lightheadedness with hypotension: Yes Has patient had a PCN reaction causing severe rash involving mucus membranes or skin necrosis: Yes Has patient had a PCN reaction that required hospitalization Yes Has patient had a PCN reaction occurring within the last 10 years: No-more than 10 years ago If all of the above answers are NO, then may proceed with Cephalosporin use.    Aspirin  Rash    Physical Exam: LMP 07/29/2018 (Approximate)    Physical Exam  Diagnostics:    Assessment and Plan: No diagnosis found.  No orders of the defined types were placed in this encounter.   There are no Patient Instructions on file for this visit.  No follow-ups on file.    Thank you for the opportunity to care for this patient.  Please do not hesitate to contact me with questions.  Arlean Mutter, FNP [facility]

## 2024-03-05 NOTE — Patient Instructions (Incomplete)
 Asthma Some improvement since last visit.  Follow-up with your cardiology team to evaluate wheeze Continue Symbicort  160-2 puffs twice a day with a spacer to prevent cough or wheeze Continue montelukast  10 mg once a day to prevent cough or wheeze Continue albuterol  2 puffs once every 4 hours if needed for cough or Wheeze you may use albuterol  2 puffs 5 to 15 minutes before activity to decrease cough or wheeze   Chronic rhinitis Continue Xyzal  5 mg once a day if needed for runny nose or itch Continue Flonase  2 sprays in each nostril once a day if needed for stuffy nose Consider saline nasal rinses as needed for nasal symptoms. Use this before any medicated nasal sprays for best result  Reflux Continue dietary and lifestyle modifications as listed below Continue pantoprazole  40 mg twice a day and famotidine  20 mg twice a day Continue to follow-up with your GI specialist as recommended  Call the clinic if this treatment plan is not working well for you.  Follow up in 3 months or sooner if needed.

## 2024-03-06 ENCOUNTER — Other Ambulatory Visit: Payer: Self-pay

## 2024-03-06 ENCOUNTER — Ambulatory Visit (INDEPENDENT_AMBULATORY_CARE_PROVIDER_SITE_OTHER): Admitting: Family Medicine

## 2024-03-06 ENCOUNTER — Encounter: Payer: Self-pay | Admitting: Family Medicine

## 2024-03-06 VITALS — BP 90/60 | HR 75 | Temp 98.2°F | Resp 16 | Ht 64.0 in | Wt 225.0 lb

## 2024-03-06 DIAGNOSIS — J310 Chronic rhinitis: Secondary | ICD-10-CM | POA: Diagnosis not present

## 2024-03-06 DIAGNOSIS — K219 Gastro-esophageal reflux disease without esophagitis: Secondary | ICD-10-CM | POA: Diagnosis not present

## 2024-03-06 DIAGNOSIS — J4540 Moderate persistent asthma, uncomplicated: Secondary | ICD-10-CM | POA: Insufficient documentation

## 2024-03-31 ENCOUNTER — Encounter: Payer: Self-pay | Admitting: Obstetrics and Gynecology

## 2024-03-31 ENCOUNTER — Other Ambulatory Visit (HOSPITAL_COMMUNITY)
Admission: RE | Admit: 2024-03-31 | Discharge: 2024-03-31 | Disposition: A | Discharge status: Home / Self Care | Source: Ambulatory Visit | Attending: Obstetrics and Gynecology | Admitting: Obstetrics and Gynecology

## 2024-03-31 ENCOUNTER — Ambulatory Visit: Admitting: Obstetrics and Gynecology

## 2024-03-31 DIAGNOSIS — R102 Pelvic and perineal pain: Secondary | ICD-10-CM

## 2024-03-31 DIAGNOSIS — Z1151 Encounter for screening for human papillomavirus (HPV): Secondary | ICD-10-CM | POA: Diagnosis not present

## 2024-03-31 DIAGNOSIS — Z01419 Encounter for gynecological examination (general) (routine) without abnormal findings: Secondary | ICD-10-CM | POA: Diagnosis not present

## 2024-03-31 DIAGNOSIS — R350 Frequency of micturition: Secondary | ICD-10-CM | POA: Diagnosis not present

## 2024-03-31 MED ORDER — ESTRADIOL 0.1 MG/GM VA CREA: TOPICAL_CREAM | VAGINAL | 12 refills | Status: AC

## 2024-03-31 MED ORDER — CYCLOBENZAPRINE HCL 10 MG PO TABS: 10.0000 mg | ORAL_TABLET | Freq: Two times a day (BID) | ORAL | 6 refills | Status: AC | PRN

## 2024-03-31 NOTE — Progress Notes (Addendum)
 58 y.o. GYN presents for Follow Up of abdominal cramps 8/10.  Pt requested PAP today.  Last PAP 08/06/21 NILM

## 2024-03-31 NOTE — Progress Notes (Signed)
 GYNECOLOGY VISIT  Patient name: Amanda [name] MRN [id number]  Date of birth: March 23, 1966 Chief Complaint:   Follow-up   History:  Amanda [name] is a 58 y.o. G0P0000 being seen today for pelvic pain follow up.  Discussed the use of AI scribe software for clinical note transcription with the patient, who gave verbal consent to proceed.  History of Present Illness Amanda [name] is a 58 year old female who presents with urinary frequency and back pain.  She experiences urinary frequency, particularly nocturia, waking up five to six times to urinate. She attempts to manage this by reducing fluid intake after 7 PM. She consumes caffeine, specifically coffee, sometimes as late as 2 PM, which might affect her bladder and sleep. She has not started using the vaginal estrogen cream previously prescribed for urinary symptoms.  She has a history of back problems, including disc issues, and experiences cramps. She uses Flexeril , a muscle relaxant, once in the morning and once in the evening, which helps manage her symptoms. She has not started physical therapy due to scheduling issues.  She has a history of overactive bladder and reports a recent unexpected menstrual period after eleven years of menopause.  Her sleep is disrupted due to long-standing insomnia. She has been prescribed trazodone  in the past but finds it ineffective. Despite the challenges, she is accustomed to her sleep pattern.  She has undergone surgeries, including carpal tunnel and rotator cuff surgeries, and wishes to avoid further surgical interventions. She experiences stiffness and pain, particularly in the mornings, impacting her mobility and daily activities.      Past Medical History:  Diagnosis Date   Anginal pain (HCC)    Angio-edema    Anxiety    Arthritis    lower back   Asthma    Bipolar disorder (HCC)    Cardiac arrest (HCC) 2020   Carpal tunnel syndrome, bilateral    Chronic back pain    COPD  (chronic obstructive pulmonary disease) (HCC)    Depression    Diabetes mellitus without complication (HCC)    Diverticulosis    Fx. left wrist    GERD (gastroesophageal reflux disease)    Heart murmur    born with   Heart murmur    HLD (hyperlipidemia)    Hypertension    IBS (irritable bowel syndrome)    Internal hemorrhoids    Muscle spasms of neck    Neuropathy    Pneumonia    PUD (peptic ulcer disease)     Past Surgical History:  Procedure Laterality Date   ARTHRODESIS METATARSALPHALANGEAL JOINT (MTPJ) Right 04/29/2016   Procedure: RIGHT GREAT TOE METATARSOPHALANGEAL JOINT (MTPJ) FUSION, HARDWARE REMOVAL;  Surgeon: Kay CHRISTELLA Cummins, MD;  Location: [facility];  Service: Orthopedics;  Laterality: Right;  RIGHT GREAT TOE METATARSOPHALANGEAL JOINT (MTPJ) FUSION, HARDWARE REMOVAL   CARPAL TUNNEL RELEASE Right 2003   CHOLECYSTECTOMY N/A 02/09/2014   Procedure: LAPAROSCOPIC CHOLECYSTECTOMY WITH INTRAOPERATIVE CHOLANGIOGRAM;  Surgeon: Donnice KATHEE Lunger, MD;  Location: [facility];  Service: General;  Laterality: N/A;   CHOLECYSTECTOMY     FOOT SURGERY     HAMMER TOE FUSION Bilateral 2008   HARDWARE REMOVAL Right 04/29/2016   Procedure: HARDWARE REMOVAL;  Surgeon: Kay CHRISTELLA Cummins, MD;  Location: [facility];  Service: Orthopedics;  Laterality: Right;  HARDWARE REMOVAL   KNEE ARTHROSCOPY Right 02/08/2023   Procedure: RIGHT KNEE ARTHROSCOPY;  Surgeon: Liam Lerner, MD;  Location: [facility];  Service: Orthopedics;  Laterality: Right;   LEFT HEART CATH AND CORONARY ANGIOGRAPHY N/A 08/30/2018   Procedure: LEFT HEART CATH AND CORONARY ANGIOGRAPHY;  Surgeon: Anner Alm ORN, MD;  Location: [facility];  Service: Cardiovascular;  Laterality: N/A;   ROTATOR CUFF REPAIR Right 2011   TONSILLECTOMY     WRIST FRACTURE SURGERY Left     The following portions of the patient's history were reviewed and updated as appropriate: allergies, current medications, past family  history, past medical history, past social history, past surgical history and problem list.   Health Maintenance:   Last pap     Component Value Date/Time   DIAGPAP  08/06/2021 0930    - Negative for intraepithelial lesion or malignancy (NILM)   DIAGPAP  06/05/2016 0000    NEGATIVE FOR INTRAEPITHELIAL LESIONS OR MALIGNANCY.   HPVHIGH Negative 08/06/2021 0930   ADEQPAP  08/06/2021 0930    Satisfactory for evaluation; transformation zone component PRESENT.   ADEQPAP  06/05/2016 0000    Satisfactory for evaluation  endocervical/transformation zone component PRESENT.    High Risk HPV: Positive  Adequacy:  Satisfactory for evaluation, transformation zone component PRESENT  Diagnosis:  Atypical squamous cells of undetermined significance (ASC-US )  Last mammogram: 10/2022 BIRADS 1   Review of Systems:  Pertinent items are noted in HPI. Comprehensive review of systems was otherwise negative.   Objective:  Physical Exam BP 109/71   Pulse 89   Ht 5' 7 (1.702 m)   Wt 223 lb (101.2 kg)   LMP 07/29/2018 (Approximate)   BMI 34.93 kg/m    Physical Exam Vitals and nursing note reviewed. Exam conducted with a chaperone present.  Constitutional:      Appearance: Normal appearance.  HENT:     Head: Normocephalic and atraumatic.  Pulmonary:     Effort: Pulmonary effort is normal.  Genitourinary:    General: Normal vulva.     Vagina: Normal.     Cervix: Normal.     Comments: Minimally tender bilateral levator ani Atrophic vaginal introitus  Skin:    General: Skin is warm and dry.  Neurological:     General: No focal deficit present.     Mental Status: She is alert.  Psychiatric:        Mood and Affect: Mood normal.        Behavior: Behavior normal.        Thought Content: Thought content normal.        Judgment: Judgment normal.       Assessment & Plan:   Assessment & Plan Pelvic floor muscle pain and cramps Chronic abdomino-pelvic floor muscle pain and cramps  improved with Flexeril . Nocturnal cramps persist but are less severe. Discussed potential benefits of aqua therapy. - Continue Flexeril , 1 tablet in the morning and 1 in the evening. - Resend referral to physical therapy. - Consider aqua therapy for low-impact exercise.  Overactive bladder with nocturia Overactive bladder with nocturia, urinating 5-6 times per night. Nocturia possibly exacerbated by fluid intake and caffeine. Previous prescription for vaginal estrogen cream not received due to pharmacy error. Discussed that vaginal estrogen may take time to reduce urinary frequency. - Resend prescription for vaginal estrogen cream to [facility] on [address]. - Advise to reduce fluid intake in the evening and switch to decaffeinated coffee. - If no improvement with vaginal estrogen and PFPT, consider OAB meds  Cervical Cancer Screening - Pap collected    Carter Quarry, MD [facility]

## 2024-04-06 LAB — CYTOLOGY - PAP
Comment: NEGATIVE
Diagnosis: NEGATIVE
High risk HPV: NEGATIVE

## 2024-04-07 ENCOUNTER — Ambulatory Visit: Payer: Self-pay | Admitting: Obstetrics and Gynecology

## 2024-04-27 ENCOUNTER — Other Ambulatory Visit: Payer: Self-pay | Admitting: Nurse Practitioner

## 2024-04-27 DIAGNOSIS — Z1231 Encounter for screening mammogram for malignant neoplasm of breast: Secondary | ICD-10-CM

## 2024-05-09 ENCOUNTER — Ambulatory Visit

## 2024-05-23 ENCOUNTER — Ambulatory Visit

## 2024-06-01 ENCOUNTER — Ambulatory Visit

## 2024-06-09 ENCOUNTER — Other Ambulatory Visit: Payer: Self-pay | Admitting: Nurse Practitioner

## 2024-06-09 DIAGNOSIS — Z1231 Encounter for screening mammogram for malignant neoplasm of breast: Secondary | ICD-10-CM

## 2024-06-27 ENCOUNTER — Telehealth: Payer: Self-pay | Admitting: Allergy & Immunology

## 2024-06-27 NOTE — Telephone Encounter (Signed)
 Patient called stating that when she came to her last appointment she was given a Nebulizer machine and forgot it. Patient states she has the nebulizer solution but needs the nebulizer machine.

## 2024-06-28 NOTE — Patient Instructions (Incomplete)
 Asthma Some improvement since last visit.  Follow-up with your cardiology team to evaluate wheeze Continue Symbicort  160-2 puffs twice a day with a spacer to prevent cough or wheeze Continue montelukast  10 mg once a day to prevent cough or wheeze Continue albuterol  2 puffs once every 4 hours if needed for cough or Wheeze you may use albuterol  2 puffs 5 to 15 minutes before activity to decrease cough or wheeze   Chronic rhinitis Continue Xyzal  5 mg once a day if needed for runny nose or itch Continue Flonase  2 sprays in each nostril once a day if needed for stuffy nose Consider saline nasal rinses as needed for nasal symptoms. Use this before any medicated nasal sprays for best result  Reflux Continue dietary and lifestyle modifications as listed below Continue pantoprazole  40 mg twice a day and famotidine  20 mg twice a day Continue to follow-up with your GI specialist as recommended  Call the clinic if this treatment plan is not working well for you.  Follow up in 3 months or sooner if needed.

## 2024-06-28 NOTE — Progress Notes (Signed)
   [address] [place] [address] Dept: [phone]  FOLLOW UP NOTE  Patient ID: Amanda [name], female    DOB: 22-Aug-1966  Age: 58 y.o. MRN: [id number] Date of Office Visit: 06/29/2024  Assessment  Chief Complaint: No chief complaint on file.  HPI Amanda [name] is a 58 year old female who presents to the clinic for follow-up visit.  She was last seen in this clinic on 03/06/2024 by Arlean Mutter, FNP, for evaluation of asthma, chronic rhinitis, and reflux.   Her problem list is current for hypertension, hyperlipidemia, reflux, and type 2 diabetes.  She also has a history of non-STEMI cardial infarction and smoking,   Her last environmental allergy testing via lab on 01/09/2024 was negative to the environmental panel. Discussed the use of AI scribe software for clinical note transcription with the patient, who gave verbal consent to proceed.  History of Present Illness      Drug Allergies:  Allergies  Allergen Reactions   Penicillins Anaphylaxis    Has patient had a PCN reaction causing immediate rash, facial/tongue/throat swelling, SOB or lightheadedness with hypotension: Yes Has patient had a PCN reaction causing severe rash involving mucus membranes or skin necrosis: Yes Has patient had a PCN reaction that required hospitalization Yes Has patient had a PCN reaction occurring within the last 10 years: No-more than 10 years ago If all of the above answers are NO, then may proceed with Cephalosporin use.    Aspirin  Rash    Physical Exam: LMP 07/29/2018 (Approximate)    Physical Exam  Diagnostics:    Assessment and Plan: No diagnosis found.  No orders of the defined types were placed in this encounter.   There are no Patient Instructions on file for this visit.  No follow-ups on file.    Thank you for the opportunity to care for this patient.  Please do not hesitate to contact me with questions.  Arlean Mutter, FNP [facility]

## 2024-06-29 ENCOUNTER — Encounter: Payer: Self-pay | Admitting: Family Medicine

## 2024-06-29 ENCOUNTER — Ambulatory Visit (INDEPENDENT_AMBULATORY_CARE_PROVIDER_SITE_OTHER): Admitting: Family Medicine

## 2024-06-29 ENCOUNTER — Other Ambulatory Visit: Payer: Self-pay

## 2024-06-29 VITALS — BP 110/70 | HR 78 | Temp 98.2°F | Ht 67.0 in

## 2024-06-29 DIAGNOSIS — J4551 Severe persistent asthma with (acute) exacerbation: Secondary | ICD-10-CM | POA: Diagnosis not present

## 2024-06-29 DIAGNOSIS — K219 Gastro-esophageal reflux disease without esophagitis: Secondary | ICD-10-CM

## 2024-06-29 DIAGNOSIS — J310 Chronic rhinitis: Secondary | ICD-10-CM

## 2024-06-29 MED ORDER — METHYLPREDNISOLONE ACETATE 80 MG/ML IJ SUSP
80.0000 mg | Freq: Once | INTRAMUSCULAR | Status: AC
  Administered 2024-06-29: 80 mg via INTRAMUSCULAR

## 2024-06-29 MED ORDER — MONTELUKAST SODIUM 10 MG PO TABS: 10.0000 mg | ORAL_TABLET | Freq: Every evening | ORAL | 1 refills | Status: DC

## 2024-07-07 ENCOUNTER — Ambulatory Visit
Admission: RE | Admit: 2024-07-07 | Discharge: 2024-07-07 | Disposition: A | Discharge status: Home / Self Care | Source: Ambulatory Visit | Attending: Nurse Practitioner | Admitting: Nurse Practitioner

## 2024-07-07 DIAGNOSIS — Z1231 Encounter for screening mammogram for malignant neoplasm of breast: Secondary | ICD-10-CM

## 2024-07-11 ENCOUNTER — Telehealth: Payer: Self-pay | Admitting: *Deleted

## 2024-07-11 NOTE — Telephone Encounter (Signed)
-----   Message from Marty Morton Shaggy sent at 07/11/2024  7:56 AM EST ----- Can someone call and make sure that she is feeling better?

## 2024-07-11 NOTE — Telephone Encounter (Signed)
 I called and spoke with the patient, she stated that she is still having chest pain from coughing and her voice was very raspy. She is also having a runny nose. She went to see her PCP on 06/27/24 and tested negative for Flu, COVID, and RSV. She currently denies any fever, body aches, or chills. She is experiencing chest tightness, but no wheezing. She did confirm that she finished the prednisone  and antibiotic, she is not using her nebulizer three times daily like she was, but she is still using the Symbicort  two puffs two times daily.,

## 2024-07-13 ENCOUNTER — Encounter: Payer: Self-pay | Admitting: Family Medicine

## 2024-07-13 ENCOUNTER — Ambulatory Visit: Admitting: Family Medicine

## 2024-07-13 ENCOUNTER — Other Ambulatory Visit: Payer: Self-pay

## 2024-07-13 VITALS — HR 74 | Temp 98.0°F

## 2024-07-13 DIAGNOSIS — K219 Gastro-esophageal reflux disease without esophagitis: Secondary | ICD-10-CM | POA: Diagnosis not present

## 2024-07-13 DIAGNOSIS — J310 Chronic rhinitis: Secondary | ICD-10-CM | POA: Diagnosis not present

## 2024-07-13 DIAGNOSIS — J4540 Moderate persistent asthma, uncomplicated: Secondary | ICD-10-CM

## 2024-07-13 NOTE — Patient Instructions (Addendum)
 Asthma Continue Symbicort  160-2 puffs twice a day with a spacer to prevent cough or wheeze Continue montelukast  10 mg once a day to prevent cough or wheeze Continue albuterol  2 puffs once every 4 hours if needed for cough or Wheeze you may use albuterol  2 puffs 5 to 15 minutes before activity to decrease cough or wheeze  Call the clinic if your breathing worsens, does not improve, or if you develop a fever Refer to pulmonology for further evaluation   Chronic rhinitis Continue Xyzal  5 mg once a day if needed for runny nose or itch Continue Flonase  2 sprays in each nostril once a day if needed for stuffy nose Consider saline nasal rinses as needed for nasal symptoms. Use this before any medicated nasal sprays for best result For thick postnasal drainage, continue Mucinex 600 to 1200 mg twice a day and increase fluids as tolerated Return to the clinic for skin testing to environmental allergy testing. Remember to stop your levocetirizine 3 days before the skin testing appointment   Reflux Continue dietary and lifestyle modifications as listed below Continue pantoprazole  40 mg twice a day and famotidine  20 mg twice a day Continue to follow-up with your GI specialist as recommended Call the clinic if this treatment plan is not working well for you.  Follow up in 2 months or sooner if needed.

## 2024-07-13 NOTE — Progress Notes (Signed)
 [address] [place] [address] Dept: [phone]  FOLLOW UP NOTE  Patient ID: Amanda [name], female    DOB: 1966-08-16  Age: 58 y.o. MRN: [id number] Date of Office Visit: 07/13/2024  Assessment  Chief Complaint: Follow-up ( )  HPI Amanda [name] is a 58 year old female who presents to clinic for follow-up visit.  She was last seen in this clinic on 06/29/2024 by Arlean Mutter, FNP, for evaluation of asthma, chronic rhinitis, and reflux.  At that time she had poorly controlled asthma and received Depo-Medrol , oral prednisone , and azithromycin . Her last environmental allergy testing via lab on 01/09/2024 was negative to the environmental panel. Discussed the use of AI scribe software for clinical note transcription with the patient, who gave verbal consent to proceed.  History of Present Illness Amanda [name] is a 58 year old female with asthma who presents with worsening respiratory symptoms.  At today's visit, she reports that she has been experiencing severe wheezing and coughing episodes for the past six to seven months, which have recently worsened. The wheezing is described as 'real, real heavy' and has been severe enough to concern her neighbors. She was previously on Nucala  injections for asthma but is not interested in restarting Nucala , Fasenra, or injections for asthma control. She continues Symbicort  160-2 puffs twice a day and montelukast  10 mg once a day.  Uses albuterol  daily due to shortness of breath, which helps alleviate her symptoms. She also uses albuterol  via nebulizer daily. Her breathing has improved compared to two weeks ago, but she still experiences intermittent tightness in her chest.  Of note, at her last visit, she reports that she had not been struggling with asthma symptoms and had been doing well prior to 2 weeks before her last visit.  At today's visit, she reports that this has been an issue that has been ongoing over at least the past 6 to 7  months.  Allergic rhinitis is reported as moderately well-controlled with symptoms including a runny nose and tearing, particularly during pollen season. She uses Flonase .  Nasal spray and takes levocetirizine once daily. She also has a history of OCD, which she mentions in the context of her cleaning habits and irritation caused by dust.  She reports severe back pain, described as constant and affecting both sides, especially when inhaling. She has a history of degenerative disc disease and is on pain medication for management.  She has a history of IBS and diverticulitis, for which she undergoes regular esophageal stretching. She reports experiencing reflux and heartburn in the last few days and has had her esophagus stretched several times in the past. She expresses concern about the potential for cancer and continues to get regular recommended cancer screenings, given her family history of pancreatic cancer.  Her voice has been changed for a long time, and she has been experiencing nasal sores, which she attributes to frequent nose blowing. She uses Vaseline for relief.  Her current medications are listed in the chart.   Drug Allergies:  Allergies  Allergen Reactions   Penicillins Anaphylaxis    Has patient had a PCN reaction causing immediate rash, facial/tongue/throat swelling, SOB or lightheadedness with hypotension: Yes Has patient had a PCN reaction causing severe rash involving mucus membranes or skin necrosis: Yes Has patient had a PCN reaction that required hospitalization Yes Has patient had a PCN reaction occurring within the last 10 years: No-more than 10 years ago If all of the above answers are NO, then  may proceed with Cephalosporin use.    Aspirin  Rash    Physical Exam: Pulse 74   Temp 98 F (36.7 C)   LMP 07/29/2018 (Approximate)   SpO2 99%    Physical Exam Vitals reviewed.  Constitutional:      Appearance: Normal appearance.  HENT:     Head: Normocephalic  and atraumatic.     Right Ear: Tympanic membrane normal.     Left Ear: Tympanic membrane normal.     Nose:     Comments: Bilateral nares slightly erythematous with thin clear nasal drainage noted.  Pharynx normal.  Ears normal.  Eyes normal.    Mouth/Throat:     Pharynx: Oropharynx is clear.  Eyes:     Conjunctiva/sclera: Conjunctivae normal.  Cardiovascular:     Rate and Rhythm: Normal rate and regular rhythm.     Heart sounds: Normal heart sounds. No murmur heard. Pulmonary:     Effort: Pulmonary effort is normal.     Breath sounds: Normal breath sounds.     Comments: Lungs clear to auscultation Musculoskeletal:        General: Normal range of motion.     Cervical back: Normal range of motion and neck supple.  Skin:    General: Skin is warm and dry.  Neurological:     Mental Status: She is alert and oriented to person, place, and time.  Psychiatric:        Mood and Affect: Mood normal.        Behavior: Behavior normal.        Thought Content: Thought content normal.        Judgment: Judgment normal.   Diagnostics: FVC 2.23 which is 81% of predicted value, FEV1 1.82 which is 83% of predicted value. Spirometry indicates normal ventilatory function.  Assessment and Plan: 1. Not well controlled moderate persistent asthma   2. Chronic rhinitis   3. Gastroesophageal reflux disease, unspecified whether esophagitis present     Patient Instructions  Asthma Continue Symbicort  160-2 puffs twice a day with a spacer to prevent cough or wheeze Continue montelukast  10 mg once a day to prevent cough or wheeze Continue albuterol  2 puffs once every 4 hours if needed for cough or Wheeze you may use albuterol  2 puffs 5 to 15 minutes before activity to decrease cough or wheeze  Call the clinic if your breathing worsens, does not improve, or if you develop a fever Refer to pulmonology for further evaluation   Chronic rhinitis Continue Xyzal  5 mg once a day if needed for runny nose or  itch Continue Flonase  2 sprays in each nostril once a day if needed for stuffy nose Consider saline nasal rinses as needed for nasal symptoms. Use this before any medicated nasal sprays for best result For thick postnasal drainage, continue Mucinex 600 to 1200 mg twice a day and increase fluids as tolerated Return to the clinic for skin testing to environmental allergy testing. Remember to stop your levocetirizine 3 days before the skin testing appointment   Reflux Continue dietary and lifestyle modifications as listed below Continue pantoprazole  40 mg twice a day and famotidine  20 mg twice a day Continue to follow-up with your GI specialist as recommended Call the clinic if this treatment plan is not working well for you.  Follow up in 2 months or sooner if needed.  Return in about 2 months (around 09/12/2024), or if symptoms worsen or fail to improve.    Thank you for the opportunity to care  for this patient.  Please do not hesitate to contact me with questions.  Arlean Mutter, FNP [facility] 

## 2024-07-16 ENCOUNTER — Encounter: Payer: Self-pay | Admitting: Family Medicine

## 2024-08-03 ENCOUNTER — Other Ambulatory Visit: Payer: Self-pay | Admitting: Allergy & Immunology

## 2024-08-07 ENCOUNTER — Encounter (HOSPITAL_BASED_OUTPATIENT_CLINIC_OR_DEPARTMENT_OTHER): Payer: Self-pay

## 2024-08-08 ENCOUNTER — Encounter: Payer: Self-pay | Admitting: Allergy & Immunology

## 2024-08-21 ENCOUNTER — Telehealth: Payer: Self-pay | Admitting: Family Medicine

## 2024-08-21 NOTE — Telephone Encounter (Signed)
 Pulmonology closed out Amanda [name]'s referral due to multiple no shows.

## 2024-08-21 NOTE — Telephone Encounter (Signed)
"  Ok. Thank you!  "

## 2024-09-07 ENCOUNTER — Encounter: Payer: Self-pay | Admitting: Allergy & Immunology

## 2024-09-07 ENCOUNTER — Ambulatory Visit: Admitting: Allergy & Immunology

## 2024-09-07 ENCOUNTER — Other Ambulatory Visit: Payer: Self-pay

## 2024-09-07 VITALS — BP 126/88 | HR 72 | Temp 97.6°F | Resp 19 | Ht 66.0 in | Wt 228.6 lb

## 2024-09-07 DIAGNOSIS — J4551 Severe persistent asthma with (acute) exacerbation: Secondary | ICD-10-CM

## 2024-09-07 DIAGNOSIS — R200 Anesthesia of skin: Secondary | ICD-10-CM | POA: Diagnosis not present

## 2024-09-07 DIAGNOSIS — R202 Paresthesia of skin: Secondary | ICD-10-CM | POA: Diagnosis not present

## 2024-09-07 MED ORDER — IPRATROPIUM BROMIDE 0.03 % NA SOLN: 2.0000 | Freq: Two times a day (BID) | NASAL | 3 refills | Status: AC

## 2024-09-07 MED ORDER — PANTOPRAZOLE SODIUM 40 MG PO TBEC: 40.0000 mg | DELAYED_RELEASE_TABLET | Freq: Two times a day (BID) | ORAL | 1 refills | Status: AC

## 2024-09-07 MED ORDER — FAMOTIDINE 20 MG PO TABS: 20.0000 mg | ORAL_TABLET | Freq: Two times a day (BID) | ORAL | 1 refills | Status: AC

## 2024-09-07 MED ORDER — FLUTICASONE PROPIONATE 50 MCG/ACT NA SUSP: 1.0000 | Freq: Every morning | NASAL | 1 refills | Status: AC

## 2024-09-07 MED ORDER — LEVOCETIRIZINE DIHYDROCHLORIDE 5 MG PO TABS: 5.0000 mg | ORAL_TABLET | Freq: Every day | ORAL | 1 refills | Status: AC | PRN

## 2024-09-07 MED ORDER — MONTELUKAST SODIUM 10 MG PO TABS: 10.0000 mg | ORAL_TABLET | Freq: Every evening | ORAL | 1 refills | Status: AC

## 2024-09-07 MED ORDER — BUDESONIDE-FORMOTEROL FUMARATE 160-4.5 MCG/ACT IN AERO: 2.0000 | INHALATION_SPRAY | Freq: Two times a day (BID) | RESPIRATORY_TRACT | 1 refills | Status: AC

## 2024-09-07 NOTE — Progress Notes (Signed)
 "  FOLLOW UP  Date of Service/Encounter:  09/07/24   Assessment:   Moderate persistent asthma, uncomplicated   Previously on Nucala , but due to hesitance with injections, we are going to change to Fasenra (AEC 300)    Chronic rhinitis - getting blood work   Disabled status  Tingling on the bilateral legs (has referral in place from PCP, although I do not see this in the sytem)   Plan/Recommendations:   1. Moderate persistent asthma, uncomplicated - Lung testing looks great today. - We are going to get you in to see Dr. Kassie.  - You are using your albuterol  nebulizer very frequently, which makes me worried that your asthma is not well-controlled.  - We may consider adding on an injection given EVERY SIX MONTHS (Exdensur) to control your breathing.  - Daily controller medication(s): Symbicort  160/4.59mcg two puffs twice daily with spacer - Prior to physical activity: albuterol  2 puffs 10-15 minutes before physical activity. - Rescue medications: albuterol  4 puffs every 4-6 hours as needed - Asthma control goals:  * Full participation in all desired activities (may need albuterol  before activity) * Albuterol  use two time or less a week on average (not counting use with activity) * Cough interfering with sleep two time or less a month * Oral steroids no more than once a year * No hospitalizations  2. Chronic rhinitis - We need to do some skin testing to look for environmental allergens.   - Make that appointment on your way out today.  - Continue with the Xyzal  (levocetirizine) 5mg  fdaily. - Continue with the Flonase  fluticasone ) one spray per nostril daily (aim for the ears). - Continue with the Singulair  (montelukast ) 10mg  once daily.   3. GERD  - Continue with the pantoprazole  40mg  TWICE DAILY.  - Continue with the Pepcid  (famotidine ) 20mg  TWICE DAILY.   4. Return in about 2 weeks (around 09/21/2024) for ALLERGY TESTING (1-55). You can have the follow up appointment with  Dr. Iva or a Nurse Practicioner (our Nurse Practitioners are excellent and always have Physician oversight!).   Subjective:   Amanda [name] is a 59 y.o. female presenting today for follow up of  Chief Complaint  Patient presents with   Allergic Rhinitis    Asthma    Seen ENT and didn't see nothing wrong with vocal cords. Still hoarse and struggling to breath. Does have referral to Pulmonary.     Amanda [name] has a history of the following: Patient Active Problem List   Diagnosis Date Noted   Chronic rhinitis 03/06/2024   Moderate persistent asthma, uncomplicated 03/06/2024   Loose body in knee, right knee 02/03/2023   Severe persistent asthma with acute exacerbation (HCC) 11/27/2021   Abnormal laboratory test 09/12/2021   Chronic pain syndrome 09/05/2021   Bipolar I disorder, most recent episode depressed, severe w psychosis (HCC) 02/25/2021   Alcohol abuse 02/25/2021   Abnormal weight loss 12/19/2020   Change in bowel habit 12/19/2020   Colon cancer screening 12/19/2020   Diarrhea 12/19/2020   Dysphagia 12/19/2020   Epigastric pain 12/19/2020   Family history of malignant neoplasm of gastrointestinal tract 12/19/2020   Non-ST elevation (NSTEMI) myocardial infarction [facility])    Contusion of chest 08/26/2018   Cardiac arrest (HCC) 08/26/2018   Cocaine abuse (HCC) 08/26/2018   HLD (hyperlipidemia) 08/26/2018   Elevated LFTs 08/26/2018   History of migraine    Degenerative joint disease 07/26/2017   Morbid obesity (HCC) 12/24/2016   Hyperlipidemia 12/23/2016  Painful orthopaedic hardware 03/24/2016   Corns and callosities 03/24/2016   PUD (peptic ulcer disease) 01/02/2016   Onychomycosis 01/02/2016   Seasonal allergies 11/19/2015   GERD (gastroesophageal reflux disease) 08/06/2015   Asthma 08/06/2015   Migraine 08/06/2015   Bipolar disorder (HCC) 07/25/2015   Neuropathy 07/08/2015   Major depressive disorder, recurrent, severe without psychotic features (HCC)     Alcohol use disorder, severe, dependence (HCC) 11/16/2014   Cocaine use disorder, severe, dependence (HCC) 11/16/2014   Acute calculous cholecystitis s/p lap chole 02/09/2014 02/09/2014   HTN (hypertension) 07/25/2013   Carpal tunnel syndrome 11/18/2012   Lumbar radiculopathy 11/18/2012   Anxiety state 11/18/2012    History obtained from: chart review and patient.  Discussed the use of AI scribe software for clinical note transcription with the patient and/or guardian, who gave verbal consent to proceed.  Amanda [name] is a 59 y.o. female presenting for a follow up visit.  She was last seen in November 2025.  At that time, she saw one of our nurse practitioners.  She was doing well at that time with Symbicort  160 mcg 2 puffs twice a day as well as montelukast .  Patient was referred to pulmonology for further evaluation.  For her rhinitis, she continue with Xyzal  as well as Flonase .  Reflux was controlled with pantoprazole .  Since last visit, she has done relatively well.   Asthma/Respiratory Symptom History: In October, she experienced a significant asthma attack, which was her first. During this episode, she had severe coughing that affected her voice, making it difficult to speak. She was treated with steroids and received an injection, which she did not find helpful. She used a nebulizer with albuterol  three times a day during the attack but has since reduced the frequency to one and a half to three times a day. She is currently using Symbicort  and has not needed the nebulizer in the past week.  She experiences back pain when breathing, which she has not experienced before. She also reports tingling in her pelvic area and has a history of back problems. She has been referred to neurology for further evaluation.  Allergic Rhinitis Symptom History: She is still open to doing allergy testing at some point. She remains on the levocetirizine and the fluticasone  as well as Singulair . She has not needed  antibiotics at all.   GERD Symptom History: She has a history of IBS and esophageal issues, which she initially thought might be related to her voice changes. A specialist checked her throat and found everything to be fine. She is on heartburn medication and requires refills.  Infection Symptom History: She has a history of sinus issues and uses a nasal spray, which she forgot to bring. She is considering allergy testing but has not yet scheduled the appointment.   Her social history includes occasional smoking. She had visitors from [place]  and [place] over the holidays.   Otherwise, there have been no changes to her past medical history, surgical history, family history, or social history.    Review of systems otherwise negative other than that mentioned in the HPI.    Objective:   Blood pressure 126/88, pulse 72, temperature 97.6 F (36.4 C), resp. rate 19, height 5' 6 (1.676 m), weight 228 lb 9.6 oz (103.7 kg), last menstrual period 07/29/2018, SpO2 99%. Body mass index is 36.9 kg/m.    Physical Exam Vitals reviewed.  Constitutional:      Appearance: She is well-developed.     Comments: Pleasant.  HENT:  Head: Normocephalic and atraumatic.     Right Ear: Tympanic membrane, ear canal and external ear normal. No drainage, swelling or tenderness. Tympanic membrane is not injected, scarred, erythematous, retracted or bulging.     Left Ear: Tympanic membrane, ear canal and external ear normal. No drainage, swelling or tenderness. Tympanic membrane is not injected, scarred, erythematous, retracted or bulging.     Nose: No nasal deformity, septal deviation, mucosal edema or rhinorrhea.     Right Turbinates: Enlarged, swollen and pale.     Left Turbinates: Enlarged, swollen and pale.     Right Sinus: No maxillary sinus tenderness or frontal sinus tenderness.     Left Sinus: No maxillary sinus tenderness or frontal sinus tenderness.     Comments: No polpys noted.      Mouth/Throat:     Lips: Pink.     Mouth: Mucous membranes are moist. Mucous membranes are not pale and not dry.     Pharynx: Uvula midline.  Eyes:     General: Lids are normal. Allergic shiner present.        Right eye: No discharge.        Left eye: No discharge.     Conjunctiva/sclera: Conjunctivae normal.     Right eye: Right conjunctiva is not injected. No chemosis.    Left eye: Left conjunctiva is not injected. No chemosis.    Pupils: Pupils are equal, round, and reactive to light.  Cardiovascular:     Rate and Rhythm: Normal rate and regular rhythm.     Heart sounds: Normal heart sounds.  Pulmonary:     Effort: Pulmonary effort is normal. No tachypnea, accessory muscle usage or respiratory distress.     Breath sounds: Normal breath sounds. No wheezing, rhonchi or rales.     Comments: Moving air well in all lung fields.  Decreased air movement at the bases.  No overt wheezing. Chest:     Chest wall: No tenderness.  Lymphadenopathy:     Head:     Right side of head: No submandibular, tonsillar or occipital adenopathy.     Left side of head: No submandibular, tonsillar or occipital adenopathy.     Cervical: No cervical adenopathy.  Skin:    General: Skin is warm.     Capillary Refill: Capillary refill takes less than 2 seconds.     Coloration: Skin is not pale.     Findings: No abrasion, erythema, petechiae or rash. Rash is not papular, urticarial or vesicular.  Neurological:     Mental Status: She is alert.  Psychiatric:        Behavior: Behavior is cooperative.      Diagnostic studies:    Spirometry: results normal (FEV1: 1.73/80%, FVC: 2.25/82%, FEV1/FVC: 77%).    Spirometry consistent with normal pattern.   Allergy Studies: none      Marty Shaggy, MD  [facility]        "

## 2024-09-07 NOTE — Patient Instructions (Addendum)
 1. Moderate persistent asthma, uncomplicated - Lung testing looks great today. - We are going to get you in to see Dr. Kassie.  - You are using your albuterol  nebulizer very frequently, which makes me worried that your asthma is not well-controlled.  - We may consider adding on an injection given EVERY SIX MONTHS (Exdensur) to control your breathing:     - Daily controller medication(s): Symbicort  160/4.44mcg two puffs twice daily with spacer - Prior to physical activity: albuterol  2 puffs 10-15 minutes before physical activity. - Rescue medications: albuterol  4 puffs every 4-6 hours as needed - Asthma control goals:  * Full participation in all desired activities (may need albuterol  before activity) * Albuterol  use two time or less a week on average (not counting use with activity) * Cough interfering with sleep two time or less a month * Oral steroids no more than once a year * No hospitalizations  2. Chronic rhinitis - We need to do some skin testing to look for environmental allergens.   - Make that appointment on your way out today.  - Continue with the Xyzal  (levocetirizine) 5mg  daily. - Continue with the Flonase  fluticasone ) one spray per nostril daily (aim for the ears). - Continue with the Singulair  (montelukast ) 10mg  once daily.   3. GERD  - Continue with the pantoprazole  40mg  TWICE DAILY.  - Continue with the Pepcid  (famotidine ) 20mg  TWICE DAILY.   4. Return in about 2 weeks (around 09/21/2024) for ALLERGY TESTING (1-55). You can have the follow up appointment with Dr. Iva or a Nurse Practicioner (our Nurse Practitioners are excellent and always have Physician oversight!).    Please inform us  of any Emergency Department visits, hospitalizations, or changes in symptoms. Call us  before going to the ED for breathing or allergy symptoms since we might be able to fit you in for a sick visit. Feel free to contact us  anytime with any questions, problems, or concerns.  It  was a pleasure to meet you today!  Websites that have reliable patient information: 1. American Academy of Asthma, Allergy, and Immunology: www.aaaai.org 2. Food Allergy Research and Education (FARE): foodallergy.org 3. Mothers of Asthmatics: http://www.asthmacommunitynetwork.org 4. American College of Allergy, Asthma, and Immunology: www.acaai.org      Like us  on [employer] and Instagram for our latest updates!      A healthy democracy works best when [employer] participate! Make sure you are registered to vote! If you have moved or changed any of your contact information, you will need to get this updated before voting! Scan the QR codes below to learn more!

## 2024-09-11 ENCOUNTER — Telehealth (HOSPITAL_BASED_OUTPATIENT_CLINIC_OR_DEPARTMENT_OTHER): Payer: Self-pay

## 2024-09-11 NOTE — Telephone Encounter (Signed)
 Copied from CRM [id number]. Topic: Appointments - Transfer of Care >> Sep 11, 2024 10:10 AM Ismael A wrote: Pt is requesting to transfer FROM: Dr. Kassie Pt is requesting to transfer TO: Almarie Ferrari Reason for requested transfer: pt prefers clinic on [address] It is the responsibility of the team the patient would like to transfer to (Dr. Almarie Ferrari) to reach out to the patient if for any reason this transfer is not acceptable.

## 2024-09-12 ENCOUNTER — Telehealth: Payer: Self-pay | Admitting: Allergy & Immunology

## 2024-09-12 NOTE — Telephone Encounter (Signed)
 [facility] sent me a message and stated that Amanda [name] currently sees Dr. Leopoldo over at [facility]  So I went ahead and re-did the referral and sent her to Dr. Leopoldo with [facility] since she is already established.

## 2024-09-14 NOTE — Telephone Encounter (Signed)
 Ok to transfer to [address] location. Would recommend tagging a new physician at that location there as well otherwise future messages including acute visits/requests would be sent to [facility] location.

## 2024-09-21 ENCOUNTER — Other Ambulatory Visit: Payer: Self-pay

## 2024-09-26 ENCOUNTER — Ambulatory Visit: Admitting: Allergy & Immunology

## 2024-10-05 ENCOUNTER — Encounter: Admitting: Primary Care
